# Patient Record
Sex: Male | Born: 1935
Health system: Southern US, Community
[De-identification: ages and names within clinical notes are randomized; demographics above are authoritative.]

## PROBLEM LIST (undated history)

## (undated) DIAGNOSIS — A419 Sepsis, unspecified organism: Secondary | ICD-10-CM

## (undated) DIAGNOSIS — M25559 Pain in unspecified hip: Secondary | ICD-10-CM

## (undated) DIAGNOSIS — K279 Peptic ulcer, site unspecified, unspecified as acute or chronic, without hemorrhage or perforation: Secondary | ICD-10-CM

## (undated) DIAGNOSIS — F329 Major depressive disorder, single episode, unspecified: Secondary | ICD-10-CM

## (undated) DIAGNOSIS — Z9289 Personal history of other medical treatment: Secondary | ICD-10-CM

## (undated) DIAGNOSIS — N400 Enlarged prostate without lower urinary tract symptoms: Secondary | ICD-10-CM

## (undated) DIAGNOSIS — R972 Elevated prostate specific antigen [PSA]: Secondary | ICD-10-CM

## (undated) DIAGNOSIS — I4891 Unspecified atrial fibrillation: Secondary | ICD-10-CM

## (undated) DIAGNOSIS — G8929 Other chronic pain: Secondary | ICD-10-CM

## (undated) DIAGNOSIS — B351 Tinea unguium: Secondary | ICD-10-CM

## (undated) DIAGNOSIS — R269 Unspecified abnormalities of gait and mobility: Secondary | ICD-10-CM

## (undated) DIAGNOSIS — M1990 Unspecified osteoarthritis, unspecified site: Secondary | ICD-10-CM

## (undated) DIAGNOSIS — I10 Essential (primary) hypertension: Secondary | ICD-10-CM

## (undated) DIAGNOSIS — Z9889 Other specified postprocedural states: Secondary | ICD-10-CM

## (undated) DIAGNOSIS — M5416 Radiculopathy, lumbar region: Secondary | ICD-10-CM

## (undated) DIAGNOSIS — S83519A Sprain of anterior cruciate ligament of unspecified knee, initial encounter: Secondary | ICD-10-CM

## (undated) DIAGNOSIS — M545 Low back pain, unspecified: Secondary | ICD-10-CM

## (undated) DIAGNOSIS — F32A Depression, unspecified: Secondary | ICD-10-CM

## (undated) DIAGNOSIS — M21371 Foot drop, right foot: Secondary | ICD-10-CM

## (undated) DIAGNOSIS — I341 Nonrheumatic mitral (valve) prolapse: Secondary | ICD-10-CM

## (undated) DIAGNOSIS — J398 Other specified diseases of upper respiratory tract: Secondary | ICD-10-CM

## (undated) DIAGNOSIS — K5792 Diverticulitis of intestine, part unspecified, without perforation or abscess without bleeding: Secondary | ICD-10-CM

## (undated) DIAGNOSIS — R0600 Dyspnea, unspecified: Secondary | ICD-10-CM

## (undated) DIAGNOSIS — H9190 Unspecified hearing loss, unspecified ear: Secondary | ICD-10-CM

## (undated) DIAGNOSIS — I350 Nonrheumatic aortic (valve) stenosis: Secondary | ICD-10-CM

## (undated) DIAGNOSIS — G4730 Sleep apnea, unspecified: Secondary | ICD-10-CM

## (undated) HISTORY — DX: Nonrheumatic aortic (valve) stenosis: I35.0

## (undated) HISTORY — DX: Sprain of anterior cruciate ligament of unspecified knee, initial encounter: S83.519A

## (undated) HISTORY — DX: Peptic ulcer, site unspecified, unspecified as acute or chronic, without hemorrhage or perforation: K27.9

## (undated) HISTORY — DX: Sleep apnea, unspecified: G47.30

## (undated) HISTORY — DX: Pain in unspecified hip: M25.559

## (undated) HISTORY — PX: CARDIAC CATHETERIZATION: SHX172

## (undated) HISTORY — PX: SHOULDER SURGERY: SHX246

## (undated) HISTORY — DX: Major depressive disorder, single episode, unspecified: F32.9

## (undated) HISTORY — PX: CATARACT EXTRACTION: SUR2

## (undated) HISTORY — DX: Personal history of other medical treatment: Z92.89

## (undated) HISTORY — PX: TONSILLECTOMY: SUR1361

## (undated) HISTORY — DX: Unspecified hearing loss, unspecified ear: H91.90

## (undated) HISTORY — DX: Radiculopathy, lumbar region: M54.16

## (undated) HISTORY — DX: Elevated prostate specific antigen (PSA): R97.20

## (undated) HISTORY — DX: Other chronic pain: G89.29

## (undated) HISTORY — DX: Tinea unguium: B35.1

## (undated) HISTORY — DX: Essential (primary) hypertension: I10

## (undated) HISTORY — DX: Other specified postprocedural states: Z98.890

## (undated) HISTORY — DX: Sepsis, unspecified organism: A41.9

## (undated) HISTORY — DX: Diverticulitis of intestine, part unspecified, without perforation or abscess without bleeding: K57.92

## (undated) HISTORY — DX: Foot drop, right foot: M21.371

## (undated) HISTORY — DX: Benign prostatic hyperplasia without lower urinary tract symptoms: N40.0

## (undated) HISTORY — DX: Low back pain: M54.5

## (undated) HISTORY — DX: Unspecified abnormalities of gait and mobility: R26.9

## (undated) HISTORY — DX: Nonrheumatic mitral (valve) prolapse: I34.1

## (undated) HISTORY — DX: Low back pain, unspecified: M54.50

## (undated) HISTORY — DX: Depression, unspecified: F32.A

---

## 1994-11-14 HISTORY — PX: OTHER SURGICAL HISTORY: SHX169

## 1999-01-15 ENCOUNTER — Ambulatory Visit (HOSPITAL_COMMUNITY): Admission: RE | Admit: 1999-01-15 | Discharge: 1999-01-15 | Payer: Self-pay | Admitting: Gastroenterology

## 2000-06-28 ENCOUNTER — Inpatient Hospital Stay (HOSPITAL_COMMUNITY): Admission: RE | Admit: 2000-06-28 | Discharge: 2000-06-30 | Payer: Self-pay | Admitting: *Deleted

## 2000-07-19 ENCOUNTER — Encounter: Admission: RE | Admit: 2000-07-19 | Discharge: 2000-08-03 | Payer: Self-pay | Admitting: *Deleted

## 2003-11-15 DIAGNOSIS — A419 Sepsis, unspecified organism: Secondary | ICD-10-CM

## 2003-11-15 HISTORY — DX: Sepsis, unspecified organism: A41.9

## 2004-01-02 ENCOUNTER — Ambulatory Visit (HOSPITAL_COMMUNITY): Admission: RE | Admit: 2004-01-02 | Discharge: 2004-01-02 | Payer: Self-pay | Admitting: Gastroenterology

## 2004-12-29 ENCOUNTER — Emergency Department (HOSPITAL_COMMUNITY): Admission: EM | Admit: 2004-12-29 | Discharge: 2004-12-30 | Payer: Self-pay | Admitting: Emergency Medicine

## 2004-12-31 ENCOUNTER — Inpatient Hospital Stay (HOSPITAL_COMMUNITY): Admission: EM | Admit: 2004-12-31 | Discharge: 2005-01-04 | Payer: Self-pay | Admitting: Emergency Medicine

## 2004-12-31 ENCOUNTER — Ambulatory Visit: Payer: Self-pay | Admitting: Infectious Diseases

## 2005-05-26 ENCOUNTER — Encounter: Admission: RE | Admit: 2005-05-26 | Discharge: 2005-05-26 | Payer: Self-pay | Admitting: Orthopedic Surgery

## 2011-12-13 DIAGNOSIS — I10 Essential (primary) hypertension: Secondary | ICD-10-CM | POA: Diagnosis not present

## 2011-12-13 DIAGNOSIS — Z0000 Encounter for general adult medical examination without abnormal findings: Secondary | ICD-10-CM | POA: Diagnosis not present

## 2012-01-12 DIAGNOSIS — L821 Other seborrheic keratosis: Secondary | ICD-10-CM | POA: Diagnosis not present

## 2012-01-12 DIAGNOSIS — L820 Inflamed seborrheic keratosis: Secondary | ICD-10-CM | POA: Diagnosis not present

## 2012-01-12 DIAGNOSIS — L570 Actinic keratosis: Secondary | ICD-10-CM | POA: Diagnosis not present

## 2012-01-12 DIAGNOSIS — C44319 Basal cell carcinoma of skin of other parts of face: Secondary | ICD-10-CM | POA: Diagnosis not present

## 2012-02-09 DIAGNOSIS — C44319 Basal cell carcinoma of skin of other parts of face: Secondary | ICD-10-CM | POA: Diagnosis not present

## 2012-03-22 DIAGNOSIS — C4442 Squamous cell carcinoma of skin of scalp and neck: Secondary | ICD-10-CM | POA: Diagnosis not present

## 2012-03-22 DIAGNOSIS — L570 Actinic keratosis: Secondary | ICD-10-CM | POA: Diagnosis not present

## 2012-03-22 DIAGNOSIS — D044 Carcinoma in situ of skin of scalp and neck: Secondary | ICD-10-CM | POA: Diagnosis not present

## 2012-03-22 DIAGNOSIS — Z85828 Personal history of other malignant neoplasm of skin: Secondary | ICD-10-CM | POA: Diagnosis not present

## 2012-03-22 DIAGNOSIS — L821 Other seborrheic keratosis: Secondary | ICD-10-CM | POA: Diagnosis not present

## 2012-05-09 DIAGNOSIS — R972 Elevated prostate specific antigen [PSA]: Secondary | ICD-10-CM | POA: Diagnosis not present

## 2012-05-09 DIAGNOSIS — N400 Enlarged prostate without lower urinary tract symptoms: Secondary | ICD-10-CM | POA: Diagnosis not present

## 2012-05-09 DIAGNOSIS — N401 Enlarged prostate with lower urinary tract symptoms: Secondary | ICD-10-CM | POA: Diagnosis not present

## 2012-05-31 DIAGNOSIS — I10 Essential (primary) hypertension: Secondary | ICD-10-CM | POA: Diagnosis not present

## 2012-08-29 ENCOUNTER — Ambulatory Visit (INDEPENDENT_AMBULATORY_CARE_PROVIDER_SITE_OTHER): Payer: Medicare Other | Admitting: Family Medicine

## 2012-08-29 DIAGNOSIS — Z23 Encounter for immunization: Secondary | ICD-10-CM | POA: Diagnosis not present

## 2012-09-20 DIAGNOSIS — H04129 Dry eye syndrome of unspecified lacrimal gland: Secondary | ICD-10-CM | POA: Diagnosis not present

## 2012-09-20 DIAGNOSIS — H5200 Hypermetropia, unspecified eye: Secondary | ICD-10-CM | POA: Diagnosis not present

## 2012-09-20 DIAGNOSIS — H01009 Unspecified blepharitis unspecified eye, unspecified eyelid: Secondary | ICD-10-CM | POA: Diagnosis not present

## 2012-09-20 DIAGNOSIS — H2589 Other age-related cataract: Secondary | ICD-10-CM | POA: Diagnosis not present

## 2012-09-26 DIAGNOSIS — D1801 Hemangioma of skin and subcutaneous tissue: Secondary | ICD-10-CM | POA: Diagnosis not present

## 2012-09-26 DIAGNOSIS — L738 Other specified follicular disorders: Secondary | ICD-10-CM | POA: Diagnosis not present

## 2012-09-26 DIAGNOSIS — L820 Inflamed seborrheic keratosis: Secondary | ICD-10-CM | POA: Diagnosis not present

## 2012-09-26 DIAGNOSIS — L570 Actinic keratosis: Secondary | ICD-10-CM | POA: Diagnosis not present

## 2012-09-26 DIAGNOSIS — D239 Other benign neoplasm of skin, unspecified: Secondary | ICD-10-CM | POA: Diagnosis not present

## 2013-01-03 DIAGNOSIS — I10 Essential (primary) hypertension: Secondary | ICD-10-CM | POA: Diagnosis not present

## 2013-01-03 DIAGNOSIS — Z1331 Encounter for screening for depression: Secondary | ICD-10-CM | POA: Diagnosis not present

## 2013-01-03 DIAGNOSIS — Z0000 Encounter for general adult medical examination without abnormal findings: Secondary | ICD-10-CM | POA: Diagnosis not present

## 2013-01-23 DIAGNOSIS — M1710 Unilateral primary osteoarthritis, unspecified knee: Secondary | ICD-10-CM | POA: Diagnosis not present

## 2013-02-05 DIAGNOSIS — R269 Unspecified abnormalities of gait and mobility: Secondary | ICD-10-CM | POA: Diagnosis not present

## 2013-02-05 DIAGNOSIS — M6281 Muscle weakness (generalized): Secondary | ICD-10-CM | POA: Diagnosis not present

## 2013-02-08 DIAGNOSIS — M6281 Muscle weakness (generalized): Secondary | ICD-10-CM | POA: Diagnosis not present

## 2013-02-08 DIAGNOSIS — R269 Unspecified abnormalities of gait and mobility: Secondary | ICD-10-CM | POA: Diagnosis not present

## 2013-02-12 DIAGNOSIS — M6281 Muscle weakness (generalized): Secondary | ICD-10-CM | POA: Diagnosis not present

## 2013-02-12 DIAGNOSIS — R269 Unspecified abnormalities of gait and mobility: Secondary | ICD-10-CM | POA: Diagnosis not present

## 2013-02-14 DIAGNOSIS — M6281 Muscle weakness (generalized): Secondary | ICD-10-CM | POA: Diagnosis not present

## 2013-02-14 DIAGNOSIS — R269 Unspecified abnormalities of gait and mobility: Secondary | ICD-10-CM | POA: Diagnosis not present

## 2013-02-19 DIAGNOSIS — M6281 Muscle weakness (generalized): Secondary | ICD-10-CM | POA: Diagnosis not present

## 2013-02-19 DIAGNOSIS — R269 Unspecified abnormalities of gait and mobility: Secondary | ICD-10-CM | POA: Diagnosis not present

## 2013-02-26 DIAGNOSIS — M1710 Unilateral primary osteoarthritis, unspecified knee: Secondary | ICD-10-CM | POA: Diagnosis not present

## 2013-02-26 DIAGNOSIS — R269 Unspecified abnormalities of gait and mobility: Secondary | ICD-10-CM | POA: Diagnosis not present

## 2013-02-26 DIAGNOSIS — M6281 Muscle weakness (generalized): Secondary | ICD-10-CM | POA: Diagnosis not present

## 2013-03-05 DIAGNOSIS — R269 Unspecified abnormalities of gait and mobility: Secondary | ICD-10-CM | POA: Diagnosis not present

## 2013-03-05 DIAGNOSIS — M6281 Muscle weakness (generalized): Secondary | ICD-10-CM | POA: Diagnosis not present

## 2013-03-08 DIAGNOSIS — M6281 Muscle weakness (generalized): Secondary | ICD-10-CM | POA: Diagnosis not present

## 2013-03-08 DIAGNOSIS — R269 Unspecified abnormalities of gait and mobility: Secondary | ICD-10-CM | POA: Diagnosis not present

## 2013-03-21 DIAGNOSIS — D1801 Hemangioma of skin and subcutaneous tissue: Secondary | ICD-10-CM | POA: Diagnosis not present

## 2013-03-21 DIAGNOSIS — L570 Actinic keratosis: Secondary | ICD-10-CM | POA: Diagnosis not present

## 2013-03-21 DIAGNOSIS — L821 Other seborrheic keratosis: Secondary | ICD-10-CM | POA: Diagnosis not present

## 2013-03-21 DIAGNOSIS — Z85828 Personal history of other malignant neoplasm of skin: Secondary | ICD-10-CM | POA: Diagnosis not present

## 2013-03-21 DIAGNOSIS — L819 Disorder of pigmentation, unspecified: Secondary | ICD-10-CM | POA: Diagnosis not present

## 2013-03-21 DIAGNOSIS — D235 Other benign neoplasm of skin of trunk: Secondary | ICD-10-CM | POA: Diagnosis not present

## 2013-05-13 DIAGNOSIS — S99919A Unspecified injury of unspecified ankle, initial encounter: Secondary | ICD-10-CM | POA: Diagnosis not present

## 2013-05-13 DIAGNOSIS — M25579 Pain in unspecified ankle and joints of unspecified foot: Secondary | ICD-10-CM | POA: Diagnosis not present

## 2013-05-13 DIAGNOSIS — T148XXA Other injury of unspecified body region, initial encounter: Secondary | ICD-10-CM | POA: Diagnosis not present

## 2013-05-13 DIAGNOSIS — S99929A Unspecified injury of unspecified foot, initial encounter: Secondary | ICD-10-CM | POA: Diagnosis not present

## 2013-06-20 DIAGNOSIS — I10 Essential (primary) hypertension: Secondary | ICD-10-CM | POA: Diagnosis not present

## 2013-06-20 DIAGNOSIS — IMO0002 Reserved for concepts with insufficient information to code with codable children: Secondary | ICD-10-CM | POA: Diagnosis not present

## 2013-06-20 DIAGNOSIS — R972 Elevated prostate specific antigen [PSA]: Secondary | ICD-10-CM | POA: Diagnosis not present

## 2013-06-20 DIAGNOSIS — F329 Major depressive disorder, single episode, unspecified: Secondary | ICD-10-CM | POA: Diagnosis not present

## 2013-06-20 DIAGNOSIS — N400 Enlarged prostate without lower urinary tract symptoms: Secondary | ICD-10-CM | POA: Diagnosis not present

## 2013-07-01 DIAGNOSIS — K648 Other hemorrhoids: Secondary | ICD-10-CM | POA: Diagnosis not present

## 2013-07-01 DIAGNOSIS — N400 Enlarged prostate without lower urinary tract symptoms: Secondary | ICD-10-CM | POA: Diagnosis not present

## 2013-07-01 DIAGNOSIS — K922 Gastrointestinal hemorrhage, unspecified: Secondary | ICD-10-CM | POA: Diagnosis not present

## 2013-07-01 DIAGNOSIS — I10 Essential (primary) hypertension: Secondary | ICD-10-CM | POA: Diagnosis not present

## 2013-07-08 DIAGNOSIS — K5731 Diverticulosis of large intestine without perforation or abscess with bleeding: Secondary | ICD-10-CM | POA: Diagnosis not present

## 2013-07-08 DIAGNOSIS — D62 Acute posthemorrhagic anemia: Secondary | ICD-10-CM | POA: Diagnosis not present

## 2013-07-16 DIAGNOSIS — K5731 Diverticulosis of large intestine without perforation or abscess with bleeding: Secondary | ICD-10-CM | POA: Diagnosis not present

## 2013-07-24 DIAGNOSIS — K5731 Diverticulosis of large intestine without perforation or abscess with bleeding: Secondary | ICD-10-CM | POA: Diagnosis not present

## 2013-09-12 ENCOUNTER — Ambulatory Visit (INDEPENDENT_AMBULATORY_CARE_PROVIDER_SITE_OTHER): Payer: Medicare Other | Admitting: Family Medicine

## 2013-09-12 DIAGNOSIS — Z23 Encounter for immunization: Secondary | ICD-10-CM

## 2013-09-24 DIAGNOSIS — H2510 Age-related nuclear cataract, unspecified eye: Secondary | ICD-10-CM | POA: Diagnosis not present

## 2013-09-24 DIAGNOSIS — H5200 Hypermetropia, unspecified eye: Secondary | ICD-10-CM | POA: Diagnosis not present

## 2013-09-24 DIAGNOSIS — H52229 Regular astigmatism, unspecified eye: Secondary | ICD-10-CM | POA: Diagnosis not present

## 2013-10-02 DIAGNOSIS — M1710 Unilateral primary osteoarthritis, unspecified knee: Secondary | ICD-10-CM | POA: Diagnosis not present

## 2013-10-02 DIAGNOSIS — M25469 Effusion, unspecified knee: Secondary | ICD-10-CM | POA: Diagnosis not present

## 2013-10-02 DIAGNOSIS — M25579 Pain in unspecified ankle and joints of unspecified foot: Secondary | ICD-10-CM | POA: Diagnosis not present

## 2013-10-30 DIAGNOSIS — M25579 Pain in unspecified ankle and joints of unspecified foot: Secondary | ICD-10-CM | POA: Diagnosis not present

## 2013-11-18 DIAGNOSIS — M19079 Primary osteoarthritis, unspecified ankle and foot: Secondary | ICD-10-CM | POA: Diagnosis not present

## 2013-11-20 DIAGNOSIS — M1710 Unilateral primary osteoarthritis, unspecified knee: Secondary | ICD-10-CM | POA: Diagnosis not present

## 2013-11-20 DIAGNOSIS — M25579 Pain in unspecified ankle and joints of unspecified foot: Secondary | ICD-10-CM | POA: Diagnosis not present

## 2013-11-26 DIAGNOSIS — M25579 Pain in unspecified ankle and joints of unspecified foot: Secondary | ICD-10-CM | POA: Diagnosis not present

## 2013-12-12 DIAGNOSIS — M19079 Primary osteoarthritis, unspecified ankle and foot: Secondary | ICD-10-CM | POA: Diagnosis not present

## 2013-12-17 DIAGNOSIS — M25579 Pain in unspecified ankle and joints of unspecified foot: Secondary | ICD-10-CM | POA: Diagnosis not present

## 2013-12-23 DIAGNOSIS — M25579 Pain in unspecified ankle and joints of unspecified foot: Secondary | ICD-10-CM | POA: Diagnosis not present

## 2014-01-13 DIAGNOSIS — Z23 Encounter for immunization: Secondary | ICD-10-CM | POA: Diagnosis not present

## 2014-01-13 DIAGNOSIS — F3289 Other specified depressive episodes: Secondary | ICD-10-CM | POA: Diagnosis not present

## 2014-01-13 DIAGNOSIS — Z0000 Encounter for general adult medical examination without abnormal findings: Secondary | ICD-10-CM | POA: Diagnosis not present

## 2014-01-13 DIAGNOSIS — F329 Major depressive disorder, single episode, unspecified: Secondary | ICD-10-CM | POA: Diagnosis not present

## 2014-01-13 DIAGNOSIS — K5730 Diverticulosis of large intestine without perforation or abscess without bleeding: Secondary | ICD-10-CM | POA: Diagnosis not present

## 2014-01-13 DIAGNOSIS — I059 Rheumatic mitral valve disease, unspecified: Secondary | ICD-10-CM | POA: Diagnosis not present

## 2014-01-13 DIAGNOSIS — I10 Essential (primary) hypertension: Secondary | ICD-10-CM | POA: Diagnosis not present

## 2014-04-01 DIAGNOSIS — D485 Neoplasm of uncertain behavior of skin: Secondary | ICD-10-CM | POA: Diagnosis not present

## 2014-04-01 DIAGNOSIS — C44621 Squamous cell carcinoma of skin of unspecified upper limb, including shoulder: Secondary | ICD-10-CM | POA: Diagnosis not present

## 2014-04-01 DIAGNOSIS — Z85828 Personal history of other malignant neoplasm of skin: Secondary | ICD-10-CM | POA: Diagnosis not present

## 2014-04-01 DIAGNOSIS — D1801 Hemangioma of skin and subcutaneous tissue: Secondary | ICD-10-CM | POA: Diagnosis not present

## 2014-04-01 DIAGNOSIS — L821 Other seborrheic keratosis: Secondary | ICD-10-CM | POA: Diagnosis not present

## 2014-04-01 DIAGNOSIS — D239 Other benign neoplasm of skin, unspecified: Secondary | ICD-10-CM | POA: Diagnosis not present

## 2014-04-01 DIAGNOSIS — L570 Actinic keratosis: Secondary | ICD-10-CM | POA: Diagnosis not present

## 2014-04-22 DIAGNOSIS — M2010 Hallux valgus (acquired), unspecified foot: Secondary | ICD-10-CM | POA: Diagnosis not present

## 2014-04-22 DIAGNOSIS — M2040 Other hammer toe(s) (acquired), unspecified foot: Secondary | ICD-10-CM | POA: Diagnosis not present

## 2014-04-22 DIAGNOSIS — B351 Tinea unguium: Secondary | ICD-10-CM | POA: Diagnosis not present

## 2014-05-23 DIAGNOSIS — M76829 Posterior tibial tendinitis, unspecified leg: Secondary | ICD-10-CM | POA: Diagnosis not present

## 2014-05-29 DIAGNOSIS — K5730 Diverticulosis of large intestine without perforation or abscess without bleeding: Secondary | ICD-10-CM | POA: Diagnosis not present

## 2014-05-29 DIAGNOSIS — Z1211 Encounter for screening for malignant neoplasm of colon: Secondary | ICD-10-CM | POA: Diagnosis not present

## 2014-05-29 DIAGNOSIS — D126 Benign neoplasm of colon, unspecified: Secondary | ICD-10-CM | POA: Diagnosis not present

## 2014-06-23 DIAGNOSIS — I10 Essential (primary) hypertension: Secondary | ICD-10-CM | POA: Diagnosis not present

## 2014-06-23 DIAGNOSIS — K113 Abscess of salivary gland: Secondary | ICD-10-CM | POA: Diagnosis not present

## 2014-06-26 DIAGNOSIS — R0609 Other forms of dyspnea: Secondary | ICD-10-CM | POA: Diagnosis not present

## 2014-06-26 DIAGNOSIS — R0989 Other specified symptoms and signs involving the circulatory and respiratory systems: Secondary | ICD-10-CM | POA: Diagnosis not present

## 2014-06-26 DIAGNOSIS — T7020XA Unspecified effects of high altitude, initial encounter: Secondary | ICD-10-CM | POA: Diagnosis not present

## 2014-06-26 DIAGNOSIS — R0602 Shortness of breath: Secondary | ICD-10-CM | POA: Diagnosis not present

## 2014-07-07 DIAGNOSIS — K113 Abscess of salivary gland: Secondary | ICD-10-CM | POA: Diagnosis not present

## 2014-07-07 DIAGNOSIS — I10 Essential (primary) hypertension: Secondary | ICD-10-CM | POA: Diagnosis not present

## 2014-07-07 DIAGNOSIS — T7020XA Unspecified effects of high altitude, initial encounter: Secondary | ICD-10-CM | POA: Diagnosis not present

## 2014-08-25 DIAGNOSIS — N402 Nodular prostate without lower urinary tract symptoms: Secondary | ICD-10-CM | POA: Diagnosis not present

## 2014-08-26 ENCOUNTER — Other Ambulatory Visit (HOSPITAL_COMMUNITY): Payer: Self-pay | Admitting: Urology

## 2014-08-26 DIAGNOSIS — N402 Nodular prostate without lower urinary tract symptoms: Secondary | ICD-10-CM

## 2014-08-29 DIAGNOSIS — Z23 Encounter for immunization: Secondary | ICD-10-CM | POA: Diagnosis not present

## 2014-09-09 ENCOUNTER — Ambulatory Visit (HOSPITAL_COMMUNITY)
Admission: RE | Admit: 2014-09-09 | Discharge: 2014-09-09 | Disposition: A | Discharge status: Home / Self Care | Payer: Medicare Other | Source: Ambulatory Visit | Attending: Urology | Admitting: Urology

## 2014-09-09 DIAGNOSIS — R972 Elevated prostate specific antigen [PSA]: Secondary | ICD-10-CM | POA: Diagnosis not present

## 2014-09-09 DIAGNOSIS — M6289 Other specified disorders of muscle: Secondary | ICD-10-CM | POA: Insufficient documentation

## 2014-09-09 DIAGNOSIS — N400 Enlarged prostate without lower urinary tract symptoms: Secondary | ICD-10-CM | POA: Insufficient documentation

## 2014-09-09 DIAGNOSIS — N402 Nodular prostate without lower urinary tract symptoms: Secondary | ICD-10-CM

## 2014-09-09 DIAGNOSIS — N323 Diverticulum of bladder: Secondary | ICD-10-CM | POA: Insufficient documentation

## 2014-09-09 DIAGNOSIS — K5730 Diverticulosis of large intestine without perforation or abscess without bleeding: Secondary | ICD-10-CM | POA: Diagnosis not present

## 2014-09-09 LAB — POCT I-STAT CREATININE: Creatinine, Ser: 0.9 mg/dL (ref 0.50–1.35)

## 2014-09-09 MED ORDER — GADOBENATE DIMEGLUMINE 529 MG/ML IV SOLN
20.0000 mL | Freq: Once | INTRAVENOUS | Status: AC | PRN
  Administered 2014-09-09: 20 mL via INTRAVENOUS

## 2014-09-22 DIAGNOSIS — R0683 Snoring: Secondary | ICD-10-CM | POA: Diagnosis not present

## 2014-09-25 DIAGNOSIS — H25813 Combined forms of age-related cataract, bilateral: Secondary | ICD-10-CM | POA: Diagnosis not present

## 2014-09-30 DIAGNOSIS — L821 Other seborrheic keratosis: Secondary | ICD-10-CM | POA: Diagnosis not present

## 2014-09-30 DIAGNOSIS — L720 Epidermal cyst: Secondary | ICD-10-CM | POA: Diagnosis not present

## 2014-09-30 DIAGNOSIS — Z85828 Personal history of other malignant neoplasm of skin: Secondary | ICD-10-CM | POA: Diagnosis not present

## 2014-09-30 DIAGNOSIS — L570 Actinic keratosis: Secondary | ICD-10-CM | POA: Diagnosis not present

## 2014-09-30 DIAGNOSIS — D1801 Hemangioma of skin and subcutaneous tissue: Secondary | ICD-10-CM | POA: Diagnosis not present

## 2014-10-23 DIAGNOSIS — G4733 Obstructive sleep apnea (adult) (pediatric): Secondary | ICD-10-CM | POA: Diagnosis not present

## 2014-11-04 DIAGNOSIS — H903 Sensorineural hearing loss, bilateral: Secondary | ICD-10-CM | POA: Diagnosis not present

## 2014-12-01 DIAGNOSIS — H25041 Posterior subcapsular polar age-related cataract, right eye: Secondary | ICD-10-CM | POA: Diagnosis not present

## 2014-12-01 DIAGNOSIS — H2512 Age-related nuclear cataract, left eye: Secondary | ICD-10-CM | POA: Diagnosis not present

## 2014-12-01 DIAGNOSIS — H25011 Cortical age-related cataract, right eye: Secondary | ICD-10-CM | POA: Diagnosis not present

## 2014-12-01 DIAGNOSIS — H2511 Age-related nuclear cataract, right eye: Secondary | ICD-10-CM | POA: Diagnosis not present

## 2014-12-25 DIAGNOSIS — H25812 Combined forms of age-related cataract, left eye: Secondary | ICD-10-CM | POA: Diagnosis not present

## 2014-12-25 DIAGNOSIS — H25012 Cortical age-related cataract, left eye: Secondary | ICD-10-CM | POA: Diagnosis not present

## 2014-12-25 DIAGNOSIS — H2512 Age-related nuclear cataract, left eye: Secondary | ICD-10-CM | POA: Diagnosis not present

## 2014-12-26 DIAGNOSIS — H2511 Age-related nuclear cataract, right eye: Secondary | ICD-10-CM | POA: Diagnosis not present

## 2015-01-01 DIAGNOSIS — C4442 Squamous cell carcinoma of skin of scalp and neck: Secondary | ICD-10-CM | POA: Diagnosis not present

## 2015-01-01 DIAGNOSIS — C44529 Squamous cell carcinoma of skin of other part of trunk: Secondary | ICD-10-CM | POA: Diagnosis not present

## 2015-01-01 DIAGNOSIS — Z85828 Personal history of other malignant neoplasm of skin: Secondary | ICD-10-CM | POA: Diagnosis not present

## 2015-01-01 DIAGNOSIS — D485 Neoplasm of uncertain behavior of skin: Secondary | ICD-10-CM | POA: Diagnosis not present

## 2015-01-13 DIAGNOSIS — G4733 Obstructive sleep apnea (adult) (pediatric): Secondary | ICD-10-CM | POA: Diagnosis not present

## 2015-01-15 DIAGNOSIS — R972 Elevated prostate specific antigen [PSA]: Secondary | ICD-10-CM | POA: Diagnosis not present

## 2015-01-15 DIAGNOSIS — I10 Essential (primary) hypertension: Secondary | ICD-10-CM | POA: Diagnosis not present

## 2015-01-15 DIAGNOSIS — R296 Repeated falls: Secondary | ICD-10-CM | POA: Diagnosis not present

## 2015-01-15 DIAGNOSIS — Z1389 Encounter for screening for other disorder: Secondary | ICD-10-CM | POA: Diagnosis not present

## 2015-01-15 DIAGNOSIS — H9193 Unspecified hearing loss, bilateral: Secondary | ICD-10-CM | POA: Diagnosis not present

## 2015-01-15 DIAGNOSIS — H269 Unspecified cataract: Secondary | ICD-10-CM | POA: Diagnosis not present

## 2015-01-15 DIAGNOSIS — G4733 Obstructive sleep apnea (adult) (pediatric): Secondary | ICD-10-CM | POA: Diagnosis not present

## 2015-01-15 DIAGNOSIS — I341 Nonrheumatic mitral (valve) prolapse: Secondary | ICD-10-CM | POA: Diagnosis not present

## 2015-01-15 DIAGNOSIS — Z0000 Encounter for general adult medical examination without abnormal findings: Secondary | ICD-10-CM | POA: Diagnosis not present

## 2015-01-20 DIAGNOSIS — L720 Epidermal cyst: Secondary | ICD-10-CM | POA: Diagnosis not present

## 2015-01-20 DIAGNOSIS — Z85828 Personal history of other malignant neoplasm of skin: Secondary | ICD-10-CM | POA: Diagnosis not present

## 2015-01-22 DIAGNOSIS — H2511 Age-related nuclear cataract, right eye: Secondary | ICD-10-CM | POA: Diagnosis not present

## 2015-01-22 DIAGNOSIS — H25811 Combined forms of age-related cataract, right eye: Secondary | ICD-10-CM | POA: Diagnosis not present

## 2015-02-03 ENCOUNTER — Encounter: Payer: Self-pay | Admitting: Neurology

## 2015-02-03 ENCOUNTER — Ambulatory Visit (INDEPENDENT_AMBULATORY_CARE_PROVIDER_SITE_OTHER): Payer: Medicare Other | Admitting: Neurology

## 2015-02-03 VITALS — BP 183/88 | HR 58 | Ht 69.0 in | Wt 106.0 lb

## 2015-02-03 DIAGNOSIS — M21371 Foot drop, right foot: Secondary | ICD-10-CM | POA: Diagnosis not present

## 2015-02-03 DIAGNOSIS — M216X9 Other acquired deformities of unspecified foot: Secondary | ICD-10-CM | POA: Diagnosis not present

## 2015-02-03 DIAGNOSIS — R269 Unspecified abnormalities of gait and mobility: Secondary | ICD-10-CM | POA: Diagnosis not present

## 2015-02-03 DIAGNOSIS — E538 Deficiency of other specified B group vitamins: Secondary | ICD-10-CM | POA: Diagnosis not present

## 2015-02-03 HISTORY — DX: Unspecified abnormalities of gait and mobility: R26.9

## 2015-02-03 NOTE — Patient Instructions (Signed)

## 2015-02-03 NOTE — Progress Notes (Signed)
  Reason for visit: Gait disorder  Referring physician: Dr. Karen Schooler  Patrick Franco is a 81 y.o. male  History of present illness:  [name] is a 81-year-old right-handed white male with a history of a mild gait disorder that he believes has come on over the last 5 years, particularly over the last 2 years. The patient is actively engaged in yoga and Pilates, he believes that this is helpful for him, if he is not engaged in this activity for a week or 2, he believes that his balance is much worse. The patient does have a history of low back pain, and some troubles with sciatica pain at times, he believes that the left leg is slightly weaker than the right, but he has had some tendon injury in the right foot and ankle, and he has a bit of a footdrop on the right. The patient denies that he catches his toe with walking. He does well on level ground, but he has fallen on 3 occasions while fishing in a trout stream, or while quail hunting. The patient denies any numbness in the feet that is persistent, he will have occasional numbness that is temporary in the left foot. The patient has had a right total knee replacement. He has arthritis in the left knee and hip. He plays tennis, but he is careful not to try to run backwards at this time. The patient does not use a cane or other assistive device for ambulation. He does note some very mild changes in memory, but he has not given up any activities of daily living because of memory. He denies neck pain or pain down the arms. He does have benign prostate enlargement, and he is on medication for this. He has frequency of urination. He comes to this office for an evaluation.  Past Medical History  Diagnosis Date  . Peptic ulcer disease   . Hypertension   . Diverticulitis   . History of colonoscopy   . Onychomycosis   . Mitral valve prolapse   . BPH (benign prostatic hyperplasia)   . Hip pain   . Sepsis 2005    e. coli-after prostate bx  . Torn  ACL   . Lumbar radiculopathy   . Sleep apnea   . Foot drop, right   . Elevated PSA   . Depression   . Gait disorder 02/03/2015  . HOH (hard of hearing)     hearing aid  . Chronic low back pain   . Foot drop, right     Past Surgical History  Procedure Laterality Date  . Right knee replacement Right 1996  . Cataract extraction Bilateral   . Tonsillectomy      Family History  Problem Relation Age of Onset  . Pneumonia Mother 82  . Cancer - Lung Father 56  . Multiple myeloma Brother 55    Social history:  reports that he has never smoked. He has never used smokeless tobacco. He reports that he does not use illicit drugs. His alcohol history is not on file.  Medications:  Prior to Admission medications   Medication Sig Start Date End Date Taking? Authorizing Provider  acetaZOLAMIDE (DIAMOX) 125 MG tablet Take 125 mg by mouth 2 (two) times daily.   Yes Historical Provider, MD  aspirin 81 MG tablet Take 81 mg by mouth daily.   Yes Historical Provider, MD  cholecalciferol (VITAMIN D) 1000 UNITS tablet Take 1,000 Units by mouth daily.   Yes Historical Provider,   MD  citalopram (CELEXA) 20 MG tablet Take 20 mg by mouth daily. 12/22/14  Yes Historical Provider, MD  dutasteride (AVODART) 0.5 MG capsule Take 0.5 mg by mouth daily. 01/06/15  Yes Historical Provider, MD  indapamide (LOZOL) 2.5 MG tablet Take 2.5 mg by mouth daily. 01/21/15  Yes Historical Provider, MD  Melatonin 3 MG TABS Take 3 mg by mouth daily as needed (at bedtime).   Yes Historical Provider, MD  Omega-3 Fatty Acids (FISH OIL PO) Take 1,000 mg by mouth daily.   Yes Historical Provider, MD  sildenafil (VIAGRA) 50 MG tablet Take 50 mg by mouth daily as needed for erectile dysfunction.   Yes Historical Provider, MD  tamsulosin (FLOMAX) 0.4 MG CAPS capsule Take 0.4 mg by mouth daily. 01/06/15  Yes Historical Provider, MD  verapamil (CALAN-SR) 240 MG CR tablet Take 240 mg by mouth daily. 01/21/15  Yes Historical Provider, MD      No Known Allergies  ROS:  Out of a complete 14 system review of symptoms, the patient complains only of the following symptoms, and all other reviewed systems are negative.  Hearing loss, hearing aids Shortness of breath, snoring Urination problems Numbness of left foot  Blood pressure 183/88, pulse 58, height 5' 9" (1.753 m), weight 106 lb (48.081 kg).  Physical Exam  General: The patient is alert and cooperative at the time of the examination. The patient is moderately obese.  Eyes: Pupils are equal, round, and reactive to light. Discs are flat bilaterally.  Neck: The neck is supple, no carotid bruits are noted.  Respiratory: The respiratory examination is clear.  Cardiovascular: The cardiovascular examination reveals a regular rate and rhythm, no obvious murmurs or rubs are noted.  Skin: Extremities are without significant edema.  Neurologic Exam  Mental status: The patient is alert and oriented x 3 at the time of the examination. The patient has apparent normal recent and remote memory, with an apparently normal attention span and concentration ability.  Cranial nerves: Facial symmetry is present. There is good sensation of the face to pinprick and soft touch bilaterally. The strength of the facial muscles and the muscles to head turning and shoulder shrug are normal bilaterally. Speech is well enunciated, no aphasia or dysarthria is noted. Extraocular movements are full. Visual fields are full. The tongue is midline, and the patient has symmetric elevation of the soft palate. No obvious hearing deficits are noted.  Motor: The motor testing reveals 5 over 5 strength of all 4 extremities. Good symmetric motor tone is noted throughout.  Sensory: Sensory testing is intact to pinprick, soft touch, vibration sensation, and position sense on all 4 extremities. No evidence of extinction is noted.  Coordination: Cerebellar testing reveals good finger-nose-finger and heel-to-shin  bilaterally.  Gait and station: Gait is normal. Tandem gait is minimally unsteady. Romberg is negative. No drift is seen. The patient is able to walk on the toes bilaterally, unable to walk on heels bilaterally.  Reflexes: Deep tendon reflexes are symmetric, but are slightly depressed bilaterally. Toes are downgoing bilaterally.   Assessment/Plan:  1. Mild gait disorder  2. Mild bilateral foot drops  3. Degenerative arthritis, right total knee replacement  4. Low back pain  5. Mild memory disorder  The patient reports some mild changes in balance over the last several years. The gait is minimally unsteady, may be close to what would be expected for age. The patient has had a total knee replacement on the right which may impair balance, he  also has some history of low back issues. He has mild foot drops, right greater than left. The patient will undergo blood work today, MRI evaluation of the brain. He will follow-up through this office if needed, the patient is engaged in Pilates and yoga which is an excellent exercise for balance.  C. Keith  MD 02/03/2015 7:50 PM  [facility] [address] [place], [place] [address]  Phone [phone] Fax [phone]  

## 2015-02-05 ENCOUNTER — Telehealth: Payer: Self-pay

## 2015-02-05 DIAGNOSIS — M21371 Foot drop, right foot: Secondary | ICD-10-CM | POA: Diagnosis not present

## 2015-02-05 DIAGNOSIS — M25571 Pain in right ankle and joints of right foot: Secondary | ICD-10-CM | POA: Diagnosis not present

## 2015-02-05 DIAGNOSIS — M25562 Pain in left knee: Secondary | ICD-10-CM | POA: Diagnosis not present

## 2015-02-05 LAB — VITAMIN B12: Vitamin B-12: 466 pg/mL (ref 211–946)

## 2015-02-05 LAB — COPPER, SERUM: COPPER: 117 ug/dL (ref 72–166)

## 2015-02-05 LAB — METHYLMALONIC ACID, SERUM: Methylmalonic Acid: 215 nmol/L (ref 0–378)

## 2015-02-05 NOTE — Telephone Encounter (Signed)
Left voicemail relaying that patient's blood work was normal.

## 2015-02-05 NOTE — Telephone Encounter (Signed)
-----   Message from Kathrynn Ducking, MD sent at 02/05/2015  1:02 PM EDT -----  The blood work results are unremarkable. Please call the patient.  ----- Message -----    From: [facility] Lab Results In Interface    Sent: 02/04/2015   7:45 AM      To: Kathrynn Ducking, MD

## 2015-02-24 ENCOUNTER — Ambulatory Visit
Admission: RE | Admit: 2015-02-24 | Discharge: 2015-02-24 | Disposition: A | Discharge status: Home / Self Care | Payer: Medicare Other | Source: Ambulatory Visit | Attending: Neurology | Admitting: Neurology

## 2015-02-24 DIAGNOSIS — E538 Deficiency of other specified B group vitamins: Secondary | ICD-10-CM

## 2015-02-24 DIAGNOSIS — R269 Unspecified abnormalities of gait and mobility: Secondary | ICD-10-CM | POA: Diagnosis not present

## 2015-02-24 DIAGNOSIS — M21371 Foot drop, right foot: Secondary | ICD-10-CM | POA: Diagnosis not present

## 2015-02-25 ENCOUNTER — Telehealth: Payer: Self-pay | Admitting: Neurology

## 2015-02-25 NOTE — Telephone Encounter (Signed)
  I called the patient, talk with the wife. MRI the brain shows very minimal small vessel disease. Likely not the source of the balance issues. The patient has bilateral foot drops, a total knee replacement. This is likely the primary issue. The patient is participating in Pilates, and this should help his balance.  MRI brain 02/25/2015:  IMPRESSION:  Mildly abnormal MRI brain (without) demonstrating: 1. Mild scattered periventricular and subcortical chronic small vessel ischemic disease.  2. Mild perisylvian and moderate mesial temporal atrophy.  3. No acute findings.

## 2015-04-02 DIAGNOSIS — H52222 Regular astigmatism, left eye: Secondary | ICD-10-CM | POA: Diagnosis not present

## 2015-04-02 DIAGNOSIS — H04209 Unspecified epiphora, unspecified lacrimal gland: Secondary | ICD-10-CM | POA: Diagnosis not present

## 2015-04-03 DIAGNOSIS — Z85828 Personal history of other malignant neoplasm of skin: Secondary | ICD-10-CM | POA: Diagnosis not present

## 2015-04-03 DIAGNOSIS — C44529 Squamous cell carcinoma of skin of other part of trunk: Secondary | ICD-10-CM | POA: Diagnosis not present

## 2015-04-03 DIAGNOSIS — L570 Actinic keratosis: Secondary | ICD-10-CM | POA: Diagnosis not present

## 2015-04-03 DIAGNOSIS — L720 Epidermal cyst: Secondary | ICD-10-CM | POA: Diagnosis not present

## 2015-04-03 DIAGNOSIS — D1801 Hemangioma of skin and subcutaneous tissue: Secondary | ICD-10-CM | POA: Diagnosis not present

## 2015-04-03 DIAGNOSIS — L821 Other seborrheic keratosis: Secondary | ICD-10-CM | POA: Diagnosis not present

## 2015-04-07 DIAGNOSIS — Z85828 Personal history of other malignant neoplasm of skin: Secondary | ICD-10-CM | POA: Diagnosis not present

## 2015-04-07 DIAGNOSIS — C44529 Squamous cell carcinoma of skin of other part of trunk: Secondary | ICD-10-CM | POA: Diagnosis not present

## 2015-04-22 DIAGNOSIS — D045 Carcinoma in situ of skin of trunk: Secondary | ICD-10-CM | POA: Diagnosis not present

## 2015-04-22 DIAGNOSIS — G4733 Obstructive sleep apnea (adult) (pediatric): Secondary | ICD-10-CM | POA: Diagnosis not present

## 2015-04-22 DIAGNOSIS — I499 Cardiac arrhythmia, unspecified: Secondary | ICD-10-CM | POA: Diagnosis not present

## 2015-04-22 DIAGNOSIS — R0602 Shortness of breath: Secondary | ICD-10-CM | POA: Diagnosis not present

## 2015-08-18 ENCOUNTER — Observation Stay (HOSPITAL_COMMUNITY)
Admission: EM | Admit: 2015-08-18 | Discharge: 2015-08-19 | Disposition: A | Discharge status: Home / Self Care | Payer: Medicare Other | Attending: Internal Medicine | Admitting: Internal Medicine

## 2015-08-18 ENCOUNTER — Encounter (HOSPITAL_COMMUNITY): Payer: Self-pay | Admitting: *Deleted

## 2015-08-18 DIAGNOSIS — K259 Gastric ulcer, unspecified as acute or chronic, without hemorrhage or perforation: Principal | ICD-10-CM | POA: Insufficient documentation

## 2015-08-18 DIAGNOSIS — G8929 Other chronic pain: Secondary | ICD-10-CM | POA: Diagnosis not present

## 2015-08-18 DIAGNOSIS — G4730 Sleep apnea, unspecified: Secondary | ICD-10-CM | POA: Insufficient documentation

## 2015-08-18 DIAGNOSIS — I10 Essential (primary) hypertension: Secondary | ICD-10-CM | POA: Diagnosis present

## 2015-08-18 DIAGNOSIS — Z7982 Long term (current) use of aspirin: Secondary | ICD-10-CM | POA: Insufficient documentation

## 2015-08-18 DIAGNOSIS — R5383 Other fatigue: Secondary | ICD-10-CM | POA: Diagnosis not present

## 2015-08-18 DIAGNOSIS — K254 Chronic or unspecified gastric ulcer with hemorrhage: Secondary | ICD-10-CM

## 2015-08-18 DIAGNOSIS — D62 Acute posthemorrhagic anemia: Secondary | ICD-10-CM | POA: Diagnosis not present

## 2015-08-18 DIAGNOSIS — R42 Dizziness and giddiness: Secondary | ICD-10-CM | POA: Diagnosis not present

## 2015-08-18 DIAGNOSIS — M545 Low back pain, unspecified: Secondary | ICD-10-CM | POA: Diagnosis present

## 2015-08-18 DIAGNOSIS — K5730 Diverticulosis of large intestine without perforation or abscess without bleeding: Secondary | ICD-10-CM | POA: Diagnosis not present

## 2015-08-18 DIAGNOSIS — K250 Acute gastric ulcer with hemorrhage: Secondary | ICD-10-CM | POA: Diagnosis not present

## 2015-08-18 DIAGNOSIS — K922 Gastrointestinal hemorrhage, unspecified: Secondary | ICD-10-CM | POA: Diagnosis not present

## 2015-08-18 DIAGNOSIS — N400 Enlarged prostate without lower urinary tract symptoms: Secondary | ICD-10-CM | POA: Diagnosis not present

## 2015-08-18 DIAGNOSIS — R0989 Other specified symptoms and signs involving the circulatory and respiratory systems: Secondary | ICD-10-CM | POA: Diagnosis not present

## 2015-08-18 DIAGNOSIS — K279 Peptic ulcer, site unspecified, unspecified as acute or chronic, without hemorrhage or perforation: Secondary | ICD-10-CM | POA: Diagnosis not present

## 2015-08-18 DIAGNOSIS — K921 Melena: Secondary | ICD-10-CM | POA: Diagnosis not present

## 2015-08-18 DIAGNOSIS — R195 Other fecal abnormalities: Secondary | ICD-10-CM | POA: Diagnosis not present

## 2015-08-18 DIAGNOSIS — R0609 Other forms of dyspnea: Secondary | ICD-10-CM | POA: Diagnosis not present

## 2015-08-18 LAB — COMPREHENSIVE METABOLIC PANEL
ALT: 33 U/L (ref 17–63)
AST: 28 U/L (ref 15–41)
Albumin: 3.7 g/dL (ref 3.5–5.0)
Alkaline Phosphatase: 39 U/L (ref 38–126)
Anion gap: 10 (ref 5–15)
BUN: 18 mg/dL (ref 6–20)
CHLORIDE: 98 mmol/L — AB (ref 101–111)
CO2: 30 mmol/L (ref 22–32)
CREATININE: 0.96 mg/dL (ref 0.61–1.24)
Calcium: 9 mg/dL (ref 8.9–10.3)
Glucose, Bld: 102 mg/dL — ABNORMAL HIGH (ref 65–99)
POTASSIUM: 3.6 mmol/L (ref 3.5–5.1)
Sodium: 138 mmol/L (ref 135–145)
Total Bilirubin: 0.9 mg/dL (ref 0.3–1.2)
Total Protein: 5.8 g/dL — ABNORMAL LOW (ref 6.5–8.1)

## 2015-08-18 LAB — CBC
HCT: 30.2 % — ABNORMAL LOW (ref 39.0–52.0)
Hemoglobin: 10.1 g/dL — ABNORMAL LOW (ref 13.0–17.0)
MCH: 33.2 pg (ref 26.0–34.0)
MCHC: 33.4 g/dL (ref 30.0–36.0)
MCV: 99.3 fL (ref 78.0–100.0)
PLATELETS: 229 10*3/uL (ref 150–400)
RBC: 3.04 MIL/uL — AB (ref 4.22–5.81)
RDW: 14.1 % (ref 11.5–15.5)
WBC: 7.9 10*3/uL (ref 4.0–10.5)

## 2015-08-18 LAB — TYPE AND SCREEN
ABO/RH(D): A POS
Antibody Screen: NEGATIVE

## 2015-08-18 LAB — HEMOGLOBIN AND HEMATOCRIT, BLOOD
HCT: 29 % — ABNORMAL LOW (ref 39.0–52.0)
Hemoglobin: 9.9 g/dL — ABNORMAL LOW (ref 13.0–17.0)

## 2015-08-18 LAB — ABO/RH: ABO/RH(D): A POS

## 2015-08-18 LAB — POC OCCULT BLOOD, ED: FECAL OCCULT BLD: POSITIVE — AB

## 2015-08-18 MED ORDER — ALUM & MAG HYDROXIDE-SIMETH 200-200-20 MG/5ML PO SUSP: 30.0000 mL | Freq: Four times a day (QID) | ORAL | Status: DC | PRN

## 2015-08-18 MED ORDER — CITALOPRAM HYDROBROMIDE 20 MG PO TABS
20.0000 mg | ORAL_TABLET | Freq: Every day | ORAL | Status: DC
  Administered 2015-08-19: 20 mg via ORAL
  Filled 2015-08-18: qty 1

## 2015-08-18 MED ORDER — PANTOPRAZOLE SODIUM 40 MG IV SOLR
40.0000 mg | Freq: Two times a day (BID) | INTRAVENOUS | Status: DC
  Administered 2015-08-18 – 2015-08-19 (×2): 40 mg via INTRAVENOUS
  Filled 2015-08-18 (×3): qty 40

## 2015-08-18 MED ORDER — SODIUM CHLORIDE 0.9 % IJ SOLN
3.0000 mL | Freq: Two times a day (BID) | INTRAMUSCULAR | Status: DC
  Administered 2015-08-18 – 2015-08-19 (×2): 3 mL via INTRAVENOUS

## 2015-08-18 MED ORDER — DUTASTERIDE 0.5 MG PO CAPS
0.5000 mg | ORAL_CAPSULE | Freq: Every day | ORAL | Status: DC
  Administered 2015-08-19: 0.5 mg via ORAL
  Filled 2015-08-18 (×2): qty 1

## 2015-08-18 MED ORDER — ONDANSETRON HCL 4 MG/2ML IJ SOLN: 4.0000 mg | Freq: Four times a day (QID) | INTRAMUSCULAR | Status: DC | PRN

## 2015-08-18 MED ORDER — SODIUM CHLORIDE 0.9 % IV SOLN
INTRAVENOUS | Status: DC
  Administered 2015-08-18: 20:00:00 via INTRAVENOUS

## 2015-08-18 MED ORDER — VERAPAMIL HCL ER 240 MG PO TBCR
240.0000 mg | EXTENDED_RELEASE_TABLET | Freq: Every day | ORAL | Status: DC
  Administered 2015-08-19: 240 mg via ORAL
  Filled 2015-08-18 (×2): qty 1

## 2015-08-18 MED ORDER — HYDROMORPHONE HCL 1 MG/ML IJ SOLN: 1.0000 mg | INTRAMUSCULAR | Status: DC | PRN

## 2015-08-18 MED ORDER — TAMSULOSIN HCL 0.4 MG PO CAPS
0.4000 mg | ORAL_CAPSULE | Freq: Every day | ORAL | Status: DC
Start: 2015-08-19 — End: 2015-08-19
  Administered 2015-08-19: 0.4 mg via ORAL
  Filled 2015-08-18: qty 1

## 2015-08-18 MED ORDER — SUCRALFATE 1 G PO TABS
1.0000 g | ORAL_TABLET | Freq: Once | ORAL | Status: AC
  Administered 2015-08-18: 1 g via ORAL
  Filled 2015-08-18: qty 1

## 2015-08-18 MED ORDER — ACETAMINOPHEN 650 MG RE SUPP: 650.0000 mg | Freq: Four times a day (QID) | RECTAL | Status: DC | PRN

## 2015-08-18 MED ORDER — PANTOPRAZOLE SODIUM 40 MG IV SOLR
40.0000 mg | Freq: Once | INTRAVENOUS | Status: AC
  Administered 2015-08-18: 40 mg via INTRAVENOUS
  Filled 2015-08-18: qty 40

## 2015-08-18 MED ORDER — SUCRALFATE 1 GM/10ML PO SUSP
1.0000 g | Freq: Three times a day (TID) | ORAL | Status: DC
  Administered 2015-08-18 – 2015-08-19 (×2): 1 g via ORAL
  Filled 2015-08-18 (×2): qty 10

## 2015-08-18 MED ORDER — ONDANSETRON HCL 4 MG PO TABS: 4.0000 mg | ORAL_TABLET | Freq: Four times a day (QID) | ORAL | Status: DC | PRN

## 2015-08-18 MED ORDER — ACETAMINOPHEN 325 MG PO TABS: 650.0000 mg | ORAL_TABLET | Freq: Four times a day (QID) | ORAL | Status: DC | PRN

## 2015-08-18 NOTE — Progress Notes (Signed)
Report received for ER on patient

## 2015-08-18 NOTE — Consult Note (Signed)
Referring Provider: Dr. Leo Grosser [facility] EDP) Primary Care Physician:  Dr. Lavone Orn Primary Gastroenterologist:  Dr. Earle Gell  Reason for Consultation:  GI bleeding  HPI: Patrick Franco is a 81 y.o. male with no prior history of GI bleeding, who was seen by the nurse practitioner today at his primary 55 office because of dyspnea on modest exertion, and dizziness, and was noted to have dark, heme positive stool on rectal exam. He has not been on iron or Pepto-Bismol.   There is no past history of ulcer disease but the patient is on a daily aspirin without any ongoing PPI prophylaxis. He was sent to the emergency room, where his hemoglobin was noted to be 10.1, as compared to 15.8 in March 2015 at his primary physician's office. His BUN of 18 is essentially baseline.   He has only been having about 1 stool per day but they have been dark for about 5 days and, as noted, he has been symptomatic, compatible with anemia, for several weeks. He was actually up in [place] duck hunting about a week ago but found it difficult to maintain exertion.   He has not been having any upper tract dyspeptic symptomatology.  The patient has had several colonoscopies through the years, but only his most recent one, in July 2015 by Dr. Howell Rucks, showed any polyps (2 diminutive adenomatous polyps were present). He was noted to have pancolonic diverticulosis.   Past Medical History  Diagnosis Date  . Peptic ulcer disease   . Hypertension   . Diverticulitis   . History of colonoscopy   . Onychomycosis   . Mitral valve prolapse   . BPH (benign prostatic hyperplasia)   . Hip pain   . Sepsis ([place]) 2005    e. coli-after prostate bx  . Torn ACL   . Lumbar radiculopathy   . Sleep apnea   . Foot drop, right   . Elevated PSA   . Depression   . Gait disorder 02/03/2015  . HOH (hard of hearing)     hearing aid  . Chronic low back pain   . Foot drop, right     Past Surgical History  Procedure  Laterality Date  . Right knee replacement Right 1996  . Cataract extraction Bilateral   . Tonsillectomy      Prior to Admission medications   Medication Sig Start Date End Date Taking? Authorizing Provider  acetaZOLAMIDE (DIAMOX) 125 MG tablet Take 125 mg by mouth as needed (altitude changes).    Yes Historical Provider, MD  aspirin 81 MG tablet Take 81 mg by mouth daily.   Yes Historical Provider, MD  citalopram (CELEXA) 20 MG tablet Take 20 mg by mouth daily. 12/22/14  Yes Historical Provider, MD  dutasteride (AVODART) 0.5 MG capsule Take 0.5 mg by mouth daily. 01/06/15  Yes Historical Provider, MD  indapamide (LOZOL) 2.5 MG tablet Take 2.5 mg by mouth daily. 01/21/15  Yes Historical Provider, MD  Melatonin 3 MG TABS Take 3 mg by mouth at bedtime.    Yes Historical Provider, MD  Omega-3 Fatty Acids (FISH OIL PO) Take 1,000 mg by mouth daily.   Yes Historical Provider, MD  Probiotic Product (PROBIOTIC DAILY PO) Take 1 tablet by mouth daily.   Yes Historical Provider, MD  tamsulosin (FLOMAX) 0.4 MG CAPS capsule Take 0.4 mg by mouth daily. 01/06/15  Yes Historical Provider, MD  Turmeric 500 MG CAPS Take 500 mg by mouth daily.   Yes Historical Provider, MD  verapamil (CALAN-SR) 240 MG CR tablet Take 240 mg by mouth daily. 01/21/15  Yes Historical Provider, MD    No current facility-administered medications for this encounter.   Current Outpatient Prescriptions  Medication Sig Dispense Refill  . acetaZOLAMIDE (DIAMOX) 125 MG tablet Take 125 mg by mouth as needed (altitude changes).     [name] aspirin 81 MG tablet Take 81 mg by mouth daily.    . citalopram (CELEXA) 20 MG tablet Take 20 mg by mouth daily.  2  . dutasteride (AVODART) 0.5 MG capsule Take 0.5 mg by mouth daily.  2  . indapamide (LOZOL) 2.5 MG tablet Take 2.5 mg by mouth daily.  1  . Melatonin 3 MG TABS Take 3 mg by mouth at bedtime.     . Omega-3 Fatty Acids (FISH OIL PO) Take 1,000 mg by mouth daily.    . Probiotic Product (PROBIOTIC  DAILY PO) Take 1 tablet by mouth daily.    . tamsulosin (FLOMAX) 0.4 MG CAPS capsule Take 0.4 mg by mouth daily.  2  . Turmeric 500 MG CAPS Take 500 mg by mouth daily.    . verapamil (CALAN-SR) 240 MG CR tablet Take 240 mg by mouth daily.  2    Allergies as of 08/18/2015  . (No Known Allergies)    Family History  Problem Relation Age of Onset  . Pneumonia Mother 78  . Cancer - Lung Father 45  . Multiple myeloma Brother 63    Social History   Social History  . Marital Status: Married    Spouse Name: N/A  . Number of Children: N/A  . Years of Education: N/A   Occupational History  . Not on file.   Social History Main Topics  . Smoking status: Never Smoker   . Smokeless tobacco: Never Used  . Alcohol Use: Not on file  . Drug Use: No  . Sexual Activity: Not on file   Other Topics Concern  . Not on file   Social History Narrative   Patient is right handed.   Patient drinks 1 cup of coffee per day.    Review of Systems: Positive for dyspnea on exertion, mild dizziness, numbness in his feet, dark stools. Patient has sleep apnea and uses CPAP.  Physical Exam: Vital signs in last 24 hours: Temp:  [97.7 F (36.5 C)] 97.7 F (36.5 C) (10/04 1517) Pulse Rate:  [73-78] 78 (10/04 1715) Resp:  [13-18] 13 (10/04 1715) BP: (134-150)/(54-80) 144/80 mmHg (10/04 1715) SpO2:  [95 %-97 %] 97 % (10/04 1715)   General:   Alert,  Well-developed, moderately overweight, pleasant and cooperative in absolutely NAD Head:  Normocephalic and atraumatic. Eyes:  Sclera clear, no icterus.   Conjunctiva pink. Neck:   No masses or thyromegaly. Lungs:  Clear throughout to auscultation.   No wheezes, crackles, or rhonchi. No evident respiratory distress. Heart:   Regular rate and rhythm; soft systolic murmur. Abdomen: Nontympanitic, and nondistended. No masses or hepatosplenomegaly . Normal bowel sounds, without bruits, guarding, or rebound.  Small, reducible umbilical hernia present. Rectal:   Per ER staff, showed dark, heme positive stool   Msk:   Symmetrical without gross deformities. Pulses:  Normal radial pulse is noted. Extremities:   Without clubbing, cyanosis, or edema. Neurologic:  Alert and coherent;  grossly normal neurologically. Skin:  Intact without significant lesions or rashes. Warm and dry. Cervical Nodes:  No significant cervical adenopathy. Psych:   Alert and cooperative. Normal mood and affect.  Intake/Output from previous  day:   Intake/Output this shift:    Lab Results:  Recent Labs  08/18/15 1542  WBC 7.9  HGB 10.1*  HCT 30.2*  PLT 229   BMET  Recent Labs  08/18/15 1542  NA 138  K 3.6  CL 98*  CO2 30  GLUCOSE 102*  BUN 18  CREATININE 0.96  CALCIUM 9.0   LFT  Recent Labs  08/18/15 1542  PROT 5.8*  ALBUMIN 3.7  AST 28  ALT 33  ALKPHOS 39  BILITOT 0.9   PT/INR No results for input(s): LABPROT, INR in the last 72 hours.  Studies/Results: No results found.  Impression: 1. Dark, heme positive stool without evidence of active GI bleeding (normal BUN, bowel movements are not frequent or liquid in character) 2. Anemia, presumably subacute (MCV normal), with 5 g drop in hemoglobin over the past year. 3. Aspirin exposure (daily 81 mg) 4. History of pancolonic diverticulosis and 2 diminutive adenomatous polyps on last year's colonoscopy.  Plan: 1. Initiate PPI therapy and give single dose of sucralfate 2. Endoscopy tomorrow by our hospital physician. Petra Kuba, purpose, and risks of the procedure were reviewed with the patient and his wife who is at the bedside. They are somewhat familiar from a prior endoscopy done by Dr. Lyla Son roughly 20 years ago. 3. I explained that overnight observation is appropriate in view of the presence of dark stool, heme positivity, and the significant drop in hemoglobin, even though it appears that this has probably been a gradual blood loss. Assuming that tomorrow's endoscopy does not show an ulcer  that appears to be at high risk for further bleeding, discharge following the endoscopy would probably be appropriate. 4. Ultimately, a decision will have to be made as to whether this patient really needs is chronic low-dose aspirin therapy. He has no history of coronary disease and, especially if aspirin related GI bleeding is confirmed on tomorrow's endoscopy, the risk-benefit ratio may not favor continued aspirin therapy. This decision might be deferred to the patient's primary physician.     Jordan V  08/18/2015, 6:57 PM   Pager [phone] If no answer or after 5 PM call [phone]

## 2015-08-18 NOTE — ED Provider Notes (Signed)
CSN: [id number]     Arrival date & time 08/18/15  1444 History   First MD Initiated Contact with Patient 08/18/15 1640     Chief Complaint  Patient presents with  . GI Bleeding    The history is provided by the patient, the spouse and medical records.    81 year old male with history of peptic ulcer disease/H. pylori, diverticulitis, hypertension, mitral valve prolapse, BPH presenting with generalized weakness and fatigue. Onset a few weeks ago. Worsening over the last 5 days. Worse with exertion. Moderate severity. Associated with dark stools over the last week, increased belching and flatus. Patient seen in clinic today and had Hemoccult test that was positive, sent for further evaluation. Denies abdominal pain, fevers, respiratory symptoms, urinary symptoms, diarrhea. Last stool was this morning and was formed, having 1 stool per day. On 81 mg aspirin daily.    Past Medical History  Diagnosis Date  . Peptic ulcer disease   . Hypertension   . Diverticulitis   . History of colonoscopy   . Onychomycosis   . Mitral valve prolapse   . BPH (benign prostatic hyperplasia)   . Hip pain   . Sepsis ([place]) 2005    e. coli-after prostate bx  . Torn ACL   . Lumbar radiculopathy   . Sleep apnea   . Foot drop, right   . Elevated PSA   . Depression   . Gait disorder 02/03/2015  . HOH (hard of hearing)     hearing aid  . Chronic low back pain   . Foot drop, right    Past Surgical History  Procedure Laterality Date  . Right knee replacement Right 1996  . Cataract extraction Bilateral   . Tonsillectomy     Family History  Problem Relation Age of Onset  . Pneumonia Mother 30  . Cancer - Lung Father 21  . Multiple myeloma Brother 48   Social History  Substance Use Topics  . Smoking status: Never Smoker   . Smokeless tobacco: Never Used  . Alcohol Use: None    Review of Systems  Constitutional: Positive for fatigue. Negative for fever.  HENT: Negative for rhinorrhea.   Eyes:  Negative for visual disturbance.  Respiratory: Negative for shortness of breath.   Cardiovascular: Negative for chest pain.  Gastrointestinal: Positive for blood in stool. Negative for nausea, vomiting, abdominal pain, diarrhea and constipation.  Genitourinary: Negative for decreased urine volume.  Skin: Negative for rash.  Allergic/Immunologic: Negative for immunocompromised state.  Neurological: Positive for weakness (generalized) and headaches. Negative for syncope.  Psychiatric/Behavioral: Negative for confusion.    Allergies  Review of patient's allergies indicates no known allergies.  Home Medications   Prior to Admission medications   Medication Sig Start Date End Date Taking? Authorizing Provider  acetaZOLAMIDE (DIAMOX) 125 MG tablet Take 125 mg by mouth as needed (altitude changes).    Yes Historical Provider, MD  aspirin 81 MG tablet Take 81 mg by mouth daily.   Yes Historical Provider, MD  citalopram (CELEXA) 20 MG tablet Take 20 mg by mouth daily. 12/22/14  Yes Historical Provider, MD  dutasteride (AVODART) 0.5 MG capsule Take 0.5 mg by mouth daily. 01/06/15  Yes Historical Provider, MD  indapamide (LOZOL) 2.5 MG tablet Take 2.5 mg by mouth daily. 01/21/15  Yes Historical Provider, MD  Melatonin 3 MG TABS Take 3 mg by mouth at bedtime.    Yes Historical Provider, MD  Omega-3 Fatty Acids (FISH OIL PO) Take 1,000 mg by mouth  daily.   Yes Historical Provider, MD  Probiotic Product (PROBIOTIC DAILY PO) Take 1 tablet by mouth daily.   Yes Historical Provider, MD  tamsulosin (FLOMAX) 0.4 MG CAPS capsule Take 0.4 mg by mouth daily. 01/06/15  Yes Historical Provider, MD  Turmeric 500 MG CAPS Take 500 mg by mouth daily.   Yes Historical Provider, MD  verapamil (CALAN-SR) 240 MG CR tablet Take 240 mg by mouth daily. 01/21/15  Yes Historical Provider, MD   BP 164/76 mmHg  Pulse 76  Temp(Src) 98.2 F (36.8 C) (Oral)  Resp 19  Ht 5' 10"  (1.778 m)  Wt 200 lb (90.719 kg)  BMI 28.70 kg/m2   SpO2 96% Physical Exam  Constitutional: He is oriented to person, place, and time. He appears well-developed and well-nourished. No distress.  HENT:  Head: Normocephalic and atraumatic.  Eyes: Right eye exhibits no discharge. Left eye exhibits no discharge.  Neck: No tracheal deviation present.  Cardiovascular: Normal rate and regular rhythm.   Pulmonary/Chest: Effort normal and breath sounds normal. No respiratory distress.  Abdominal: Soft. He exhibits no distension. There is no tenderness.  Genitourinary:  External hemorrhoid, soft. Dark stool, hemoccult positive.   Musculoskeletal: He exhibits no edema.  Neurological: He is alert and oriented to person, place, and time.  Skin: Skin is warm and dry.  Psychiatric: He has a normal mood and affect. His behavior is normal.    ED Course  Procedures (including critical care time) Labs Review Labs Reviewed  COMPREHENSIVE METABOLIC PANEL - Abnormal; Notable for the following:    Chloride 98 (*)    Glucose, Bld 102 (*)    Total Protein 5.8 (*)    All other components within normal limits  CBC - Abnormal; Notable for the following:    RBC 3.04 (*)    Hemoglobin 10.1 (*)    HCT 30.2 (*)    All other components within normal limits  BASIC METABOLIC PANEL - Abnormal; Notable for the following:    Chloride 98 (*)    Glucose, Bld 110 (*)    Calcium 8.5 (*)    All other components within normal limits  CBC - Abnormal; Notable for the following:    RBC 2.95 (*)    Hemoglobin 10.1 (*)    HCT 29.5 (*)    MCH 34.2 (*)    All other components within normal limits  HEMOGLOBIN AND HEMATOCRIT, BLOOD - Abnormal; Notable for the following:    Hemoglobin 9.9 (*)    HCT 29.0 (*)    All other components within normal limits  POC OCCULT BLOOD, ED - Abnormal; Notable for the following:    Fecal Occult Bld POSITIVE (*)    All other components within normal limits  HELICOBACTER PYLORI ANTIGEN DET, STOOL  HEMOGLOBIN AND HEMATOCRIT, BLOOD   HEMOGLOBIN AND HEMATOCRIT, BLOOD  TYPE AND SCREEN  ABO/RH    Imaging Review No results found. I have personally reviewed and evaluated these images and lab results as part of my medical decision-making.   EKG Interpretation None      MDM   Final diagnoses:  Gastrointestinal hemorrhage, unspecified gastritis, unspecified gastrointestinal hemorrhage type    81 year old male with history of peptic ulcer disease/H. pylori, diverticulitis, hypertension, mitral valve prolapse, BPH presenting with generalized weakness and fatigue. AF, VSS. Hemoccult positive with Hgb 10 concerning for recurrent likely upper GIB. BUN wnl, feel acute bleeding less likely at this time.   Spoke with Dr. Wallis MartSadie Haber [facility] on call. Plan EGD  tomorrow after period of observation overnight with serial H&H. Admitted to hospitalist service for further care. Given IV protonix, carafate.  Case discussed with Dr. [name] who oversaw management of this patient.   Ivin Booty, MD 08/19/15 1328  Leo Grosser, MD 08/19/15 [phone]

## 2015-08-18 NOTE — ED Notes (Signed)
Pt in c/o dark stools for the last week, generalized fatigue and intermittent dizziness, went to PCP and had a positive hemoccult, sent here for further evaluation, pt alert and oriented, no distress noted

## 2015-08-18 NOTE — ED Provider Notes (Signed)
I saw and evaluated the patient, reviewed the resident's note and I agree with the findings and plan. Please see associated encounter note.   EKG Interpretation None     81 year old male presenting with dark stools, weakness, lightheadedness over the last week. He is a patient with GI. Plan admission to medicine and scope with them.   Leo Grosser, MD 08/19/15 [phone]

## 2015-08-18 NOTE — H&P (Signed)
History and Physical        Hospital Admission Note Date: 08/18/2015  Patient name: Patrick Franco Medical record number: [id number] Date of birth: July 25, 1932 Age: 81 y.o. Gender: male  PCP: Dorian Heckle, MD  Referring physician: Dr Amedeo Gory  Chief Complaint:  dark tarry stools since last Thursday (6 days)  HPI: Patient is a 81 year old male with peptic ulcer disease, hypertension, mitral valve prolapse, chronic low back pain, BPH who presented to ED with episodes of dark tarry stools for the last 6 days. Patient reported that he was traveling to [place] last month and while he was in [place], [place], on Thursday he noticed dark stools every day. Since then he's been feeling intermittent dizziness, fatigued and generalized weakness. The patient returned from [place] on Sunday 2 days ago, went to see his PCP. Heme occult test was positive and patient was sent to the ED for further evaluation.  Patient takes aspirin 81 mg daily, took one advil last week, he does not take NSAIDs daily. Patient reports he had a colonoscopy last year which showed 2 polyps otherwise was normal. EGD in 1990s when he was found to have a ulcer.  CBC showed hemoglobin of 10.0, baseline unknown. BMET unremarkable. FOBT positive. [facility], Dr Cristina Gong was consulted in ED, recommended admission and endoscopy in a.m.  Review of Systems:  Constitutional: Denies fever, chills, diaphoresis, poor appetite and fatigue.  HEENT: Denies photophobia, eye pain, redness, hearing loss, ear pain, congestion, sore throat, rhinorrhea, sneezing, mouth sores, trouble swallowing, neck pain, neck stiffness and tinnitus.   Respiratory: Denies SOB, DOE, cough, chest tightness,  and wheezing.   Cardiovascular: Denies chest pain, palpitations and leg swelling.  Gastrointestinal: Please see history of present illness  Genitourinary: Denies  dysuria, urgency, frequency, hematuria, flank pain and difficulty urinating.  Musculoskeletal: Denies myalgias, back pain, joint swelling, arthralgias and gait problem.  Skin: Denies pallor, rash and wound.  Neurological: +dizziness, seizures, syncope, +weakness, light-headedness, numbness and headaches.  Hematological: Denies adenopathy. Easy bruising, personal or family bleeding history  Psychiatric/Behavioral: Denies suicidal ideation, mood changes, confusion, nervousness, sleep disturbance and agitation  Past Medical History: Past Medical History  Diagnosis Date  . Peptic ulcer disease   . Hypertension   . Diverticulitis   . History of colonoscopy   . Onychomycosis   . Mitral valve prolapse   . BPH (benign prostatic hyperplasia)   . Hip pain   . Sepsis ([place]) 2005    e. coli-after prostate bx  . Torn ACL   . Lumbar radiculopathy   . Sleep apnea   . Foot drop, right   . Elevated PSA   . Depression   . Gait disorder 02/03/2015  . HOH (hard of hearing)     hearing aid  . Chronic low back pain   . Foot drop, right     Past Surgical History  Procedure Laterality Date  . Right knee replacement Right 1996  . Cataract extraction Bilateral   . Tonsillectomy      Medications: Prior to Admission medications   Medication Sig Start Date End Date Taking? Authorizing Provider  acetaZOLAMIDE (DIAMOX) 125 MG tablet Take 125 mg by mouth  as needed (altitude changes).    Yes Historical Provider, MD  aspirin 81 MG tablet Take 81 mg by mouth daily.   Yes Historical Provider, MD  citalopram (CELEXA) 20 MG tablet Take 20 mg by mouth daily. 12/22/14  Yes Historical Provider, MD  dutasteride (AVODART) 0.5 MG capsule Take 0.5 mg by mouth daily. 01/06/15  Yes Historical Provider, MD  indapamide (LOZOL) 2.5 MG tablet Take 2.5 mg by mouth daily. 01/21/15  Yes Historical Provider, MD  Melatonin 3 MG TABS Take 3 mg by mouth at bedtime.    Yes Historical Provider, MD  Omega-3 Fatty Acids (FISH OIL PO)  Take 1,000 mg by mouth daily.   Yes Historical Provider, MD  Probiotic Product (PROBIOTIC DAILY PO) Take 1 tablet by mouth daily.   Yes Historical Provider, MD  tamsulosin (FLOMAX) 0.4 MG CAPS capsule Take 0.4 mg by mouth daily. 01/06/15  Yes Historical Provider, MD  Turmeric 500 MG CAPS Take 500 mg by mouth daily.   Yes Historical Provider, MD  verapamil (CALAN-SR) 240 MG CR tablet Take 240 mg by mouth daily. 01/21/15  Yes Historical Provider, MD    Allergies:  No Known Allergies  Social History:  reports that he has never smoked. He has never used smokeless tobacco. He reports that he does not use illicit drugs. Per patient he drinks 2-3 glasses of wine every night. He lives at home with his wife and is functional with his ADLs.  Family History: Family History  Problem Relation Age of Onset  . Pneumonia Mother 35  . Cancer - Lung Father 92  . Multiple myeloma Brother 72    Physical Exam: Blood pressure 144/80, pulse 78, temperature 97.7 F (36.5 C), temperature source Oral, resp. rate 13, SpO2 97 %. General: Alert, awake, oriented x3, in no acute distress. HEENT: normocephalic, atraumatic, anicteric sclera, pink conjunctiva, pupils equal and reactive to light and accomodation, oropharynx clear Neck: supple, no masses or lymphadenopathy, no goiter, no bruits  Heart: Regular rate and rhythm, 2/6 SEM Lungs: Clear to auscultation bilaterally, no wheezing, rales or rhonchi. Abdomen: Soft, nontender, nondistended, positive bowel sounds, no masses. Extremities: No clubbing, cyanosis or edema with positive pedal pulses. Neuro: Grossly intact, no focal neurological deficits, strength 5/5 upper and lower extremities bilaterally Psych: alert and oriented x 3, normal mood and affect Skin: no rashes or lesions, warm and dry   LABS on Admission:  Basic Metabolic Panel:  Recent Labs Lab 08/18/15 1542  NA 138  K 3.6  CL 98*  CO2 30  GLUCOSE 102*  BUN 18  CREATININE 0.96  CALCIUM 9.0     Liver Function Tests:  Recent Labs Lab 08/18/15 1542  AST 28  ALT 33  ALKPHOS 39  BILITOT 0.9  PROT 5.8*  ALBUMIN 3.7   No results for input(s): LIPASE, AMYLASE in the last 168 hours. No results for input(s): AMMONIA in the last 168 hours. CBC:  Recent Labs Lab 08/18/15 1542  WBC 7.9  HGB 10.1*  HCT 30.2*  MCV 99.3  PLT 229   Cardiac Enzymes: No results for input(s): CKTOTAL, CKMB, CKMBINDEX, TROPONINI in the last 168 hours. BNP: Invalid input(s): POCBNP CBG: No results for input(s): GLUCAP in the last 168 hours.  Radiological Exams on Admission:  No results found.    Assessment/Plan Principal Problem:   Upper GI bleed with known history of peptic ulcer disease, in the setting of daily aspirin - Admit to telemetry, serial H&H, clear liquid diet and NPO after  midnight  - Placed on IV Protonix, Carafate  - GI has been consulted (Dr Cristina Gong), recommended EGD in a.m.    Active Problems:   Peptic ulcer disease - as per #1, continue PPI, Carafate    Hypertension - Continue verapamil    Sleep apnea - CPAP at bedtime    Chronic low back pain - Pain control as needed    BPH (benign prostatic hyperplasia) - Continue Flomax   DVT prophylaxis:  SCDs   CODE STATUS:  full CODE STATUS, discussed with patient   Family Communication: Admission, patients condition and plan of care including tests being ordered have been discussed with the patient and wife who indicates understanding and agree with the plan and Code Status  Disposition plan: Further plan will depend as patient's clinical course evolves and further radiologic and laboratory data become available.    Time Spent on Admission: 67mns   RAI,RIPUDEEP M.D. [facility] 08/18/2015, 6:27 PM Pager: [phone] If 7PM-7AM, please contact night-coverage [email or web address] Password [email or web address]

## 2015-08-19 ENCOUNTER — Observation Stay (HOSPITAL_COMMUNITY): Payer: Medicare Other | Admitting: Certified Registered"

## 2015-08-19 ENCOUNTER — Encounter (HOSPITAL_COMMUNITY): Payer: Self-pay | Admitting: *Deleted

## 2015-08-19 ENCOUNTER — Encounter (HOSPITAL_COMMUNITY)
Admission: EM | Disposition: A | Discharge status: Home / Self Care | Payer: Self-pay | Source: Home / Self Care | Attending: Emergency Medicine

## 2015-08-19 DIAGNOSIS — K259 Gastric ulcer, unspecified as acute or chronic, without hemorrhage or perforation: Secondary | ICD-10-CM | POA: Diagnosis not present

## 2015-08-19 DIAGNOSIS — G4730 Sleep apnea, unspecified: Secondary | ICD-10-CM

## 2015-08-19 DIAGNOSIS — I10 Essential (primary) hypertension: Secondary | ICD-10-CM

## 2015-08-19 DIAGNOSIS — K254 Chronic or unspecified gastric ulcer with hemorrhage: Secondary | ICD-10-CM

## 2015-08-19 DIAGNOSIS — N400 Enlarged prostate without lower urinary tract symptoms: Secondary | ICD-10-CM

## 2015-08-19 DIAGNOSIS — M545 Low back pain: Secondary | ICD-10-CM | POA: Diagnosis not present

## 2015-08-19 DIAGNOSIS — K319 Disease of stomach and duodenum, unspecified: Secondary | ICD-10-CM | POA: Diagnosis not present

## 2015-08-19 DIAGNOSIS — K922 Gastrointestinal hemorrhage, unspecified: Secondary | ICD-10-CM | POA: Diagnosis not present

## 2015-08-19 DIAGNOSIS — K250 Acute gastric ulcer with hemorrhage: Secondary | ICD-10-CM | POA: Diagnosis not present

## 2015-08-19 DIAGNOSIS — G8929 Other chronic pain: Secondary | ICD-10-CM | POA: Diagnosis not present

## 2015-08-19 HISTORY — PX: ESOPHAGOGASTRODUODENOSCOPY: SHX5428

## 2015-08-19 LAB — BASIC METABOLIC PANEL
ANION GAP: 8 (ref 5–15)
BUN: 11 mg/dL (ref 6–20)
CALCIUM: 8.5 mg/dL — AB (ref 8.9–10.3)
CO2: 31 mmol/L (ref 22–32)
Chloride: 98 mmol/L — ABNORMAL LOW (ref 101–111)
Creatinine, Ser: 0.82 mg/dL (ref 0.61–1.24)
GLUCOSE: 110 mg/dL — AB (ref 65–99)
POTASSIUM: 3.5 mmol/L (ref 3.5–5.1)
Sodium: 137 mmol/L (ref 135–145)

## 2015-08-19 LAB — CBC
HEMATOCRIT: 29.5 % — AB (ref 39.0–52.0)
HEMOGLOBIN: 10.1 g/dL — AB (ref 13.0–17.0)
MCH: 34.2 pg — AB (ref 26.0–34.0)
MCHC: 34.2 g/dL (ref 30.0–36.0)
MCV: 100 fL (ref 78.0–100.0)
Platelets: 198 10*3/uL (ref 150–400)
RBC: 2.95 MIL/uL — AB (ref 4.22–5.81)
RDW: 14.5 % (ref 11.5–15.5)
WBC: 5.4 10*3/uL (ref 4.0–10.5)

## 2015-08-19 LAB — HEMOGLOBIN AND HEMATOCRIT, BLOOD
HEMATOCRIT: 28.7 % — AB (ref 39.0–52.0)
HEMOGLOBIN: 9.8 g/dL — AB (ref 13.0–17.0)

## 2015-08-19 SURGERY — EGD (ESOPHAGOGASTRODUODENOSCOPY)
Anesthesia: Monitor Anesthesia Care

## 2015-08-19 MED ORDER — SODIUM CHLORIDE 0.9 % IV SOLN: INTRAVENOUS | Status: DC

## 2015-08-19 MED ORDER — PROPOFOL 10 MG/ML IV BOLUS
INTRAVENOUS | Status: DC | PRN
  Administered 2015-08-19: 20 mg via INTRAVENOUS
  Administered 2015-08-19: 30 mg via INTRAVENOUS

## 2015-08-19 MED ORDER — ESOMEPRAZOLE MAGNESIUM 40 MG PO CPDR: 40.0000 mg | DELAYED_RELEASE_CAPSULE | Freq: Two times a day (BID) | ORAL | Status: DC

## 2015-08-19 MED ORDER — PROPOFOL 500 MG/50ML IV EMUL
INTRAVENOUS | Status: DC | PRN
  Administered 2015-08-19: 100 ug/kg/min via INTRAVENOUS

## 2015-08-19 MED ORDER — SUCRALFATE 1 GM/10ML PO SUSP: 1.0000 g | Freq: Three times a day (TID) | ORAL | Status: DC

## 2015-08-19 MED ORDER — BUTAMBEN-TETRACAINE-BENZOCAINE 2-2-14 % EX AERO
INHALATION_SPRAY | CUTANEOUS | Status: DC | PRN
  Administered 2015-08-19: 1 via TOPICAL

## 2015-08-19 MED ORDER — LACTATED RINGERS IV SOLN
INTRAVENOUS | Status: DC
  Administered 2015-08-19: 1000 mL via INTRAVENOUS

## 2015-08-19 MED ORDER — ESOMEPRAZOLE MAGNESIUM 40 MG PO CPDR: 40.0000 mg | DELAYED_RELEASE_CAPSULE | Freq: Every day | ORAL | Status: DC

## 2015-08-19 NOTE — H&P (View-Only) (Signed)
Referring Provider: Dr. Leo Grosser [facility] ED[facility]) Primary Care Physician:  Dr. Lavone Orn Primary Gastroenterologist:  Dr. Earle Gell  Reason for Consultation:  GI bleeding  HPI: Patrick Franco is a 81 y.o. male with no prior history of GI bleeding, who was seen by the nurse practitioner today at his primary 67 office because of dyspnea on modest exertion, and dizziness, and was noted to have dark, heme positive stool on rectal exam. He has not been on iron or Pepto-Bismol.   There is no past history of ulcer disease but the patient is on a daily aspirin without any ongoing PPI prophylaxis. He was sent to the emergency room, where his hemoglobin was noted to be 10.1, as compared to 15.8 in March 2015 at his primary physician's office. His BUN of 18 is essentially baseline.   He has only been having about 1 stool per day but they have been dark for about 5 days and, as noted, he has been symptomatic, compatible with anemia, for several weeks. He was actually up in [place] duck hunting about a week ago but found it difficult to maintain exertion.   He has not been having any upper tract dyspeptic symptomatology.  The patient has had several colonoscopies through the years, but only his most recent one, in July 2015 by Dr. Howell Rucks, showed any polyps (2 diminutive adenomatous polyps were present). He was noted to have pancolonic diverticulosis.   Past Medical History  Diagnosis Date  . Peptic ulcer disease   . Hypertension   . Diverticulitis   . History of colonoscopy   . Onychomycosis   . Mitral valve prolapse   . BPH (benign prostatic hyperplasia)   . Hip pain   . Sepsis ([place]) 2005    e. coli-after prostate bx  . Torn ACL   . Lumbar radiculopathy   . Sleep apnea   . Foot drop, right   . Elevated PSA   . Depression   . Gait disorder 02/03/2015  . HOH (hard of hearing)     hearing aid  . Chronic low back pain   . Foot drop, right     Past Surgical History  Procedure  Laterality Date  . Right knee replacement Right 1996  . Cataract extraction Bilateral   . Tonsillectomy      Prior to Admission medications   Medication Sig Start Date End Date Taking? Authorizing Provider  acetaZOLAMIDE (DIAMOX) 125 MG tablet Take 125 mg by mouth as needed (altitude changes).    Yes Historical Provider, MD  aspirin 81 MG tablet Take 81 mg by mouth daily.   Yes Historical Provider, MD  citalopram (CELEXA) 20 MG tablet Take 20 mg by mouth daily. 12/22/14  Yes Historical Provider, MD  dutasteride (AVODART) 0.5 MG capsule Take 0.5 mg by mouth daily. 01/06/15  Yes Historical Provider, MD  indapamide (LOZOL) 2.5 MG tablet Take 2.5 mg by mouth daily. 01/21/15  Yes Historical Provider, MD  Melatonin 3 MG TABS Take 3 mg by mouth at bedtime.    Yes Historical Provider, MD  Omega-3 Fatty Acids (FISH OIL PO) Take 1,000 mg by mouth daily.   Yes Historical Provider, MD  Probiotic Product (PROBIOTIC DAILY PO) Take 1 tablet by mouth daily.   Yes Historical Provider, MD  tamsulosin (FLOMAX) 0.4 MG CAPS capsule Take 0.4 mg by mouth daily. 01/06/15  Yes Historical Provider, MD  Turmeric 500 MG CAPS Take 500 mg by mouth daily.   Yes Historical Provider, MD  verapamil (CALAN-SR) 240 MG CR tablet Take 240 mg by mouth daily. 01/21/15  Yes Historical Provider, MD    No current facility-administered medications for this encounter.   Current Outpatient Prescriptions  Medication Sig Dispense Refill  . acetaZOLAMIDE (DIAMOX) 125 MG tablet Take 125 mg by mouth as needed (altitude changes).     [name] aspirin 81 MG tablet Take 81 mg by mouth daily.    . citalopram (CELEXA) 20 MG tablet Take 20 mg by mouth daily.  2  . dutasteride (AVODART) 0.5 MG capsule Take 0.5 mg by mouth daily.  2  . indapamide (LOZOL) 2.5 MG tablet Take 2.5 mg by mouth daily.  1  . Melatonin 3 MG TABS Take 3 mg by mouth at bedtime.     . Omega-3 Fatty Acids (FISH OIL PO) Take 1,000 mg by mouth daily.    . Probiotic Product (PROBIOTIC  DAILY PO) Take 1 tablet by mouth daily.    . tamsulosin (FLOMAX) 0.4 MG CAPS capsule Take 0.4 mg by mouth daily.  2  . Turmeric 500 MG CAPS Take 500 mg by mouth daily.    . verapamil (CALAN-SR) 240 MG CR tablet Take 240 mg by mouth daily.  2    Allergies as of 08/18/2015  . (No Known Allergies)    Family History  Problem Relation Age of Onset  . Pneumonia Mother 59  . Cancer - Lung Father 60  . Multiple myeloma Brother 91    Social History   Social History  . Marital Status: Married    Spouse Name: N/A  . Number of Children: N/A  . Years of Education: N/A   Occupational History  . Not on file.   Social History Main Topics  . Smoking status: Never Smoker   . Smokeless tobacco: Never Used  . Alcohol Use: Not on file  . Drug Use: No  . Sexual Activity: Not on file   Other Topics Concern  . Not on file   Social History Narrative   Patient is right handed.   Patient drinks 1 cup of coffee per day.    Review of Systems: Positive for dyspnea on exertion, mild dizziness, numbness in his feet, dark stools. Patient has sleep apnea and uses CPAP.  Physical Exam: Vital signs in last 24 hours: Temp:  [97.7 F (36.5 C)] 97.7 F (36.5 C) (10/04 1517) Pulse Rate:  [73-78] 78 (10/04 1715) Resp:  [13-18] 13 (10/04 1715) BP: (134-150)/(54-80) 144/80 mmHg (10/04 1715) SpO2:  [95 %-97 %] 97 % (10/04 1715)   General:   Alert,  Well-developed, moderately overweight, pleasant and cooperative in absolutely NAD Head:  Normocephalic and atraumatic. Eyes:  Sclera clear, no icterus.   Conjunctiva pink. Neck:   No masses or thyromegaly. Lungs:  Clear throughout to auscultation.   No wheezes, crackles, or rhonchi. No evident respiratory distress. Heart:   Regular rate and rhythm; soft systolic murmur. Abdomen: Nontympanitic, and nondistended. No masses or hepatosplenomegaly . Normal bowel sounds, without bruits, guarding, or rebound.  Small, reducible umbilical hernia present. Rectal:   Per ER staff, showed dark, heme positive stool   Msk:   Symmetrical without gross deformities. Pulses:  Normal radial pulse is noted. Extremities:   Without clubbing, cyanosis, or edema. Neurologic:  Alert and coherent;  grossly normal neurologically. Skin:  Intact without significant lesions or rashes. Warm and dry. Cervical Nodes:  No significant cervical adenopathy. Psych:   Alert and cooperative. Normal mood and affect.  Intake/Output from previous  day:   Intake/Output this shift:    Lab Results:  Recent Labs  08/18/15 1542  WBC 7.9  HGB 10.1*  HCT 30.2*  PLT 229   BMET  Recent Labs  08/18/15 1542  NA 138  K 3.6  CL 98*  CO2 30  GLUCOSE 102*  BUN 18  CREATININE 0.96  CALCIUM 9.0   LFT  Recent Labs  08/18/15 1542  PROT 5.8*  ALBUMIN 3.7  AST 28  ALT 33  ALKPHOS 39  BILITOT 0.9   PT/INR No results for input(s): LABPROT, INR in the last 72 hours.  Studies/Results: No results found.  Impression: 1. Dark, heme positive stool without evidence of active GI bleeding (normal BUN, bowel movements are not frequent or liquid in character) 2. Anemia, presumably subacute (MCV normal), with 5 g drop in hemoglobin over the past year. 3. Aspirin exposure (daily 81 mg) 4. History of pancolonic diverticulosis and 2 diminutive adenomatous polyps on last year's colonoscopy.  Plan: 1. Initiate PPI therapy and give single dose of sucralfate 2. Endoscopy tomorrow by our hospital physician. Petra Kuba, purpose, and risks of the procedure were reviewed with the patient and his wife who is at the bedside. They are somewhat familiar from a prior endoscopy done by Dr. Lyla Son roughly 20 years ago. 3. I explained that overnight observation is appropriate in view of the presence of dark stool, heme positivity, and the significant drop in hemoglobin, even though it appears that this has probably been a gradual blood loss. Assuming that tomorrow's endoscopy does not show an ulcer  that appears to be at high risk for further bleeding, discharge following the endoscopy would probably be appropriate. 4. Ultimately, a decision will have to be made as to whether this patient really needs is chronic low-dose aspirin therapy. He has no history of coronary disease and, especially if aspirin related GI bleeding is confirmed on tomorrow's endoscopy, the risk-benefit ratio may not favor continued aspirin therapy. This decision might be deferred to the patient's primary physician.     [place] V  08/18/2015, 6:57 PM   Pager [phone] If no answer or after 5 PM call [phone]

## 2015-08-19 NOTE — Anesthesia Postprocedure Evaluation (Signed)
  Anesthesia Post-op Note  Patient: Patrick Franco  Procedure(s) Performed: Procedure(s): ESOPHAGOGASTRODUODENOSCOPY (EGD) (N/A)  Patient Location: Endoscopy Unit  Anesthesia Type:MAC  Level of Consciousness: awake, alert  and oriented  Airway and Oxygen Therapy: Patient Spontanous Breathing  Post-op Pain: none  Post-op Assessment: Post-op Vital signs reviewed, Patient's Cardiovascular Status Stable, Respiratory Function Stable, Patent Airway and No signs of Nausea or vomiting              Post-op Vital Signs: Reviewed and stable  Last Vitals:  Filed Vitals:   08/19/15 1336  BP: 96/33  Pulse: 65  Temp: 36.9 C  Resp: 17    Complications: No apparent anesthesia complications

## 2015-08-19 NOTE — Progress Notes (Signed)
Patient discharged home with wife. Instructions given, wife present for teaching. Electronic prescriptions sent. IV and tele removed at discharge. No more needs from care management and/or social work. Skin intact. Patient stable, on room air. Earleen Reaper, RN

## 2015-08-19 NOTE — Interval H&P Note (Signed)
History and Physical Interval Note:  08/19/2015 12:44 PM  Patrick Franco  has presented today for surgery, with the diagnosis of gastrointestinal bleeding  The various methods of treatment have been discussed with the patient and family. After consideration of risks, benefits and other options for treatment, the patient has consented to  Procedure(s): ESOPHAGOGASTRODUODENOSCOPY (EGD) (N/A) as a surgical intervention .  The patient's history has been reviewed, patient examined, no change in status, stable for surgery.  I have reviewed the patient's chart and labs.  Questions were answered to the patient's satisfaction.      JR, L

## 2015-08-19 NOTE — Care Management Note (Signed)
Case Management Note  Patient Details  Name: STEPEHN ECKARD MRN: [id number] Date of Birth: 1932-10-02  Subjective/Objective:           Date-08-19-15 Wednesday Initial Assessment  Spoke with patient at the bedside along with wife.  Introduced self as Tourist information centre manager and explained role in discharge planning and how to be reached.  Verified patient lives at [place] in an [place] home.  Verified patient anticipates to go home with spouse at time of discharge and will have  part-time supervision by family friends neighbors at this time to best of their knowledge.  Patient has DME . Expressed potential need for no other DME.  Patient denied c needing help with their medication.  Patient drives to MD appointments.  Verified patient has PCP Patient states they currently receive [facility] services through no one.    Plan: CM will continue to follow for discharge planning and [facility] resources.   Carles Collet RN BSN CM [phone]          Action/Plan:   Expected Discharge Date:                  Expected Discharge Plan:  Home/Self Care ([facility])  In-House Referral:     Discharge planning Services  CM Consult  Post Acute Care Choice:    Choice offered to:     DME Arranged:    DME Agency:     HH Arranged:    HH Agency:     Status of Service:  In process, will continue to follow  Medicare Important Message Given:    Date Medicare IM Given:    Medicare IM give by:    Date Additional Medicare IM Given:    Additional Medicare Important Message give by:     If discussed at [place] of Stay Meetings, dates discussed:    Additional Comments:  Carles Collet, RN 08/19/2015, 2:34 PM

## 2015-08-19 NOTE — Op Note (Signed)
[facility] [place], [id number]   ENDOSCOPY PROCEDURE REPORT  PATIENT: Cleofas, Hudgins  MR#: [id number] BIRTHDATE: October 20, 1932 , 82  yrs. old GENDER: male ENDOSCOPIST: Oletta Lamas, MD REFERRED BY: [facility]. PROCEDURE DATE:  2015-08-25 PROCEDURE:   EGD and biopsy ASA CLASS:    class III INDICATIONS: subacute G.I. bleeding MEDICATION: propofol 60 mg IV TOPICAL ANESTHETIC:   Cetacaine Spray  DESCRIPTION OF PROCEDURE:   After the risks and benefits of the procedure were explained, informed consent was obtained.  The Pentax Gastroscope [id number]  endoscope was introduced through the mouth  and advanced to the second portion of the duodenum .  The instrument was slowly withdrawn as the mucosa was fully examined. Estimated blood loss is zero unless otherwise noted in this procedure report. The duodenum including the 2nd duodenum in the duodenal bulb were normal. The scope was withdrawn back into the stomach and there was ulcerated area surrounded by edematous foals mass effect only anterior antrum wall. Multiple biopsies were obtained. The remainder the stomach was seen well was normal. It was examined in the forward and in the retro flex view. The distal and proximal esophagus were seen well upon removal were normal. Patient tolerated the procedure well.    The scope was then withdrawn from the patient and the procedure completed.  COMPLICATIONS: There were no immediate complications.  ENDOSCOPIC IMPRESSION: 1. Gastric Ulcer anterior wall of the [place] biopsy RECOMMENDATIONS: would keep on PPI therapy. We'll go ahead resume diet and could probably be discharged later today or tomorrow with follow-up in 4 to 6 weeks.   _______________________________ Lorrin MaisLaurence Spates, MD 2015/08/25 1:36 PM     cc:  CPT CODES: ICD CODES:  The ICD and CPT codes recommended by this software are interpretations from the data that the  clinical staff has captured with the software.  The verification of the translation of this report to the ICD and CPT codes and modifiers is the sole responsibility of the health care institution and practicing physician where this report was generated.  [employer] will not be held responsible for the validity of the ICD and CPT codes included on this report.  AMA assumes no liability for data contained or not contained herein. CPT is a Designer, television/film set of the [employer].  PATIENT NAME:  Danni, [place] MR#: [id number]

## 2015-08-19 NOTE — Discharge Summary (Signed)
Physician Discharge Summary  Patrick Franco WEX:[id number] DOB: 08-27-1932 DOA: 08/18/2015  PCP: Irven Shelling, MD  Admit date: 08/18/2015 Discharge date: 08/19/2015  Time spent: 55 minutes  Recommendations for Outpatient Follow-up:  1. GI follow-up in 4-6 weeks   Discharge Condition: Stable   Discharge Diagnoses:  Principal Problem:   Gastric ulcer with hemorrhage Active Problems:   Hypertension   Sleep apnea   Chronic low back pain   BPH (benign prostatic hyperplasia)   History of present illness:  This is an 81 y/o male with PUD,HTN, MVP, chronic lower back pain who presents with tarry black stools for about 6 days prior to admission. He admitted to having dizziness, feeling lightheaded and weak. Fecal occult blood was found to be positive. He does take a baby aspirin daily. No use of frequent NSAIDs.  Hospital Course:  Upper GI bleed -history of peptic ulcer disease in the past -Aspirin on hold -No further bloody stools as of yesterday morning -Evaluated by equal GI-EGD today reveals a gastric ulcer -continue to hold aspirin -Continue PPI and Carafate -Follow up with GI in 4-6 weeks  Anemia, unspecified -Appears to be chronic-no significant drop noted during hospital stay -As we do not have any old blood work, I cannot compare with prior hemoglobins to note if there is acute blood loss anemia  Hypertension -Continue verapamil  BPH -Continue Flomax  Procedures:  EGD  Consultations:  [facility]  Discharge Exam: Filed Weights   08/18/15 2056  Weight: 90.719 kg (200 lb)   Filed Vitals:   08/19/15 1409  BP: 113/57  Pulse: 63  Temp:   Resp: 14    General: AAO x 3, no distress Cardiovascular: RRR, no murmurs  Respiratory: clear to auscultation bilaterally GI: soft, non-tender, non-distended, bowel sound positive  Discharge Instructions You were cared for by a hospitalist during your hospital stay. If you have any questions about your discharge  medications or the care you received while you were in the hospital after you are discharged, you can call the unit and asked to speak with the hospitalist on call if the hospitalist that took care of you is not available. Once you are discharged, your primary care physician will handle any further medical issues. Please note that NO REFILLS for any discharge medications will be authorized once you are discharged, as it is imperative that you return to your primary care physician (or establish a relationship with a primary care physician if you do not have one) for your aftercare needs so that they can reassess your need for medications and monitor your lab values.      Discharge Instructions    Diet - low sodium heart healthy    Complete by:  As directed      Increase activity slowly    Complete by:  As directed             Medication List    STOP taking these medications        aspirin 81 MG tablet      TAKE these medications        acetaZOLAMIDE 125 MG tablet  Commonly known as:  DIAMOX  Take 125 mg by mouth as needed (altitude changes).     citalopram 20 MG tablet  Commonly known as:  CELEXA  Take 20 mg by mouth daily.     dutasteride 0.5 MG capsule  Commonly known as:  AVODART  Take 0.5 mg by mouth daily.     esomeprazole  40 MG capsule  Commonly known as:  NEXIUM  Take 1 capsule (40 mg total) by mouth 2 (two) times daily before a meal.     FISH OIL PO  Take 1,000 mg by mouth daily.     indapamide 2.5 MG tablet  Commonly known as:  LOZOL  Take 2.5 mg by mouth daily.     Melatonin 3 MG Tabs  Take 3 mg by mouth at bedtime.     PROBIOTIC DAILY PO  Take 1 tablet by mouth daily.     sucralfate 1 GM/10ML suspension  Commonly known as:  CARAFATE  Take 10 mLs (1 g total) by mouth 4 (four) times daily -  with meals and at bedtime.     tamsulosin 0.4 MG Caps capsule  Commonly known as:  FLOMAX  Take 0.4 mg by mouth daily.     Turmeric 500 MG Caps  Take 500 mg by  mouth daily.     verapamil 240 MG CR tablet  Commonly known as:  CALAN-SR  Take 240 mg by mouth daily.       No Known Allergies    The results of significant diagnostics from this hospitalization (including imaging, microbiology, ancillary and laboratory) are listed below for reference.    Significant Diagnostic Studies: No results found.  Microbiology: No results found for this or any previous visit (from the past 240 hour(s)).   Labs: Basic Metabolic Panel:  Recent Labs Lab 08/18/15 1542 08/19/15 0500  NA 138 137  K 3.6 3.5  CL 98* 98*  CO2 30 31  GLUCOSE 102* 110*  BUN 18 11  CREATININE 0.96 0.82  CALCIUM 9.0 8.5*   Liver Function Tests:  Recent Labs Lab 08/18/15 1542  AST 28  ALT 33  ALKPHOS 39  BILITOT 0.9  PROT 5.8*  ALBUMIN 3.7   No results for input(s): LIPASE, AMYLASE in the last 168 hours. No results for input(s): AMMONIA in the last 168 hours. CBC:  Recent Labs Lab 08/18/15 1542 08/18/15 2125 08/19/15 0500 08/19/15 1437  WBC 7.9  --  5.4  --   HGB 10.1* 9.9* 10.1* 9.8*  HCT 30.2* 29.0* 29.5* 28.7*  MCV 99.3  --  100.0  --   PLT 229  --  198  --    Cardiac Enzymes: No results for input(s): CKTOTAL, CKMB, CKMBINDEX, TROPONINI in the last 168 hours. BNP: BNP (last 3 results) No results for input(s): BNP in the last 8760 hours.  ProBNP (last 3 results) No results for input(s): PROBNP in the last 8760 hours.  CBG: No results for input(s): GLUCAP in the last 168 hours.     SignedDebbe Odea, MD [facility] 08/19/2015, 3:05 PM

## 2015-08-19 NOTE — Anesthesia Preprocedure Evaluation (Addendum)
Anesthesia Evaluation  Patient identified by MRN, date of birth, ID band Patient awake    Reviewed: Allergy & Precautions, NPO status , Patient's Chart, lab work & pertinent test results  History of Anesthesia Complications Negative for: history of anesthetic complications  Airway Mallampati: II   Neck ROM: Full    Dental   Pulmonary sleep apnea ,    breath sounds clear to auscultation       Cardiovascular hypertension,  Rhythm:Regular Rate:Normal     Neuro/Psych  Neuromuscular disease    GI/Hepatic PUD, Likely GI bleed   Endo/Other    Renal/GU      Musculoskeletal   Abdominal   Peds  Hematology  (+) anemia ,   Anesthesia Other Findings   Reproductive/Obstetrics                            Anesthesia Physical Anesthesia Plan  ASA: III  Anesthesia Plan: MAC   Post-op Pain Management:    Induction: Intravenous  Airway Management Planned: Natural Airway  Additional Equipment:   Intra-op Plan:   Post-operative Plan:   Informed Consent: I have reviewed the patients History and Physical, chart, labs and discussed the procedure including the risks, benefits and alternatives for the proposed anesthesia with the patient or authorized representative who has indicated his/her understanding and acceptance.     Plan Discussed with:   Anesthesia Plan Comments:        Anesthesia Quick Evaluation

## 2015-08-19 NOTE — Transfer of Care (Signed)
Immediate Anesthesia Transfer of Care Note  Patient: Patrick Franco  Procedure(s) Performed: Procedure(s): ESOPHAGOGASTRODUODENOSCOPY (EGD) (N/A)  Patient Location: Endoscopy Unit  Anesthesia Type:MAC  Level of Consciousness: awake, alert  and oriented  Airway & Oxygen Therapy: Patient Spontanous Breathing  Post-op Assessment: Report given to RN and Post -op Vital signs reviewed and stable  Post vital signs: Reviewed and stable  Last Vitals:  Filed Vitals:   08/19/15 1336  BP: 96/33  Pulse: 65  Temp: 36.9 C  Resp: 17    Complications: No apparent anesthesia complications

## 2015-08-20 ENCOUNTER — Encounter (HOSPITAL_COMMUNITY): Payer: Self-pay | Admitting: Gastroenterology

## 2015-08-25 DIAGNOSIS — K922 Gastrointestinal hemorrhage, unspecified: Secondary | ICD-10-CM | POA: Diagnosis not present

## 2015-08-25 DIAGNOSIS — F329 Major depressive disorder, single episode, unspecified: Secondary | ICD-10-CM | POA: Diagnosis not present

## 2015-08-25 DIAGNOSIS — K259 Gastric ulcer, unspecified as acute or chronic, without hemorrhage or perforation: Secondary | ICD-10-CM | POA: Diagnosis not present

## 2015-08-25 DIAGNOSIS — R269 Unspecified abnormalities of gait and mobility: Secondary | ICD-10-CM | POA: Diagnosis not present

## 2015-08-25 DIAGNOSIS — I10 Essential (primary) hypertension: Secondary | ICD-10-CM | POA: Diagnosis not present

## 2015-08-25 DIAGNOSIS — G4733 Obstructive sleep apnea (adult) (pediatric): Secondary | ICD-10-CM | POA: Diagnosis not present

## 2015-09-11 ENCOUNTER — Ambulatory Visit (INDEPENDENT_AMBULATORY_CARE_PROVIDER_SITE_OTHER): Payer: Medicare Other

## 2015-09-11 DIAGNOSIS — Z23 Encounter for immunization: Secondary | ICD-10-CM

## 2015-09-21 ENCOUNTER — Ambulatory Visit (INDEPENDENT_AMBULATORY_CARE_PROVIDER_SITE_OTHER)
Admission: RE | Admit: 2015-09-21 | Discharge: 2015-09-21 | Disposition: A | Discharge status: Home / Self Care | Payer: Medicare Other | Source: Ambulatory Visit | Attending: Family Medicine | Admitting: Family Medicine

## 2015-09-21 ENCOUNTER — Encounter: Payer: Self-pay | Admitting: Family Medicine

## 2015-09-21 ENCOUNTER — Ambulatory Visit (INDEPENDENT_AMBULATORY_CARE_PROVIDER_SITE_OTHER): Payer: Medicare Other | Admitting: Family Medicine

## 2015-09-21 VITALS — BP 118/70 | HR 61 | Ht 69.0 in | Wt 207.0 lb

## 2015-09-21 DIAGNOSIS — M6289 Other specified disorders of muscle: Secondary | ICD-10-CM | POA: Diagnosis not present

## 2015-09-21 DIAGNOSIS — M545 Low back pain: Secondary | ICD-10-CM | POA: Diagnosis not present

## 2015-09-21 DIAGNOSIS — M47816 Spondylosis without myelopathy or radiculopathy, lumbar region: Secondary | ICD-10-CM | POA: Diagnosis not present

## 2015-09-21 DIAGNOSIS — G8929 Other chronic pain: Secondary | ICD-10-CM

## 2015-09-21 DIAGNOSIS — M5416 Radiculopathy, lumbar region: Secondary | ICD-10-CM

## 2015-09-21 MED ORDER — GABAPENTIN 100 MG PO CAPS: 200.0000 mg | ORAL_CAPSULE | Freq: Every day | ORAL | Status: DC

## 2015-09-21 NOTE — Progress Notes (Signed)
Pre visit review using our clinic review tool, if applicable. No additional management support is needed unless otherwise documented below in the visit note. 

## 2015-09-21 NOTE — Patient Instructions (Signed)
Good to see you both.  Ask Dr. Oletta Lamas about iron and would recommend 65mg  daily We will get xray of back today.  Vitamin D 2000-4000 IU daily Fish oil 1-2 grams daily pennsaid pinkie amount topically 2 times daily as needed.  Protein 1 gram per pound daily  See me again in 3 weeks.

## 2015-09-21 NOTE — Assessment & Plan Note (Signed)
I do believe the patient's low back pain is likely secondary to more of the degenerative disc disease as well as scoliosis causing possibly a spinal stenosis getting patient radicular symptoms as well as the numbness in his feet. I do feel that patient needs further workup and x-rays ordered today. Patient will continue his medications. We discussed over-the-counter natural supplementations a could be helpful. We discussed icing regimen and home exercises. Patient will try to make these changes and come back and see me again in 3 weeks.

## 2015-09-21 NOTE — Progress Notes (Signed)
[name] [address], [place] [address] Phone: [phone] Subjective:    I'm seeing this patient by the request  of:  Irven Shelling, MD   CC: arthralgias and fatigue  DJS:[id number] Patrick Franco is a 81 y.o. male coming in with complaint of left hip pain. Patient states he is actually having pain in multiple joints. Patient states that ever since his GI bleed last month he has had some difficulty increasing his endurance. Feels that this is been going on for quite some time though maybe even before this. 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 Seems that it is more of his back, left hip, bilateral knees, as well as numbness in his feet. Patient states standing long amount of time or walking for long amount of time makes the numbness worse. Denies significant weakness but would state that there is fatigue in his legs. Patient does not remember any true injury. Has had multiple different orthopedic problem before as well as having a total knee replacement on the right side. Patient would like to remain active but feels like he is unable to do so. No significant other changes such as chest pain, but does state that he has some shortness of breath. Patient just feels like he wants to sit around more frequently.  Past Medical History  Diagnosis Date  . Peptic ulcer disease   . Hypertension   . Diverticulitis   . History of colonoscopy   . Onychomycosis   . Mitral valve prolapse   . BPH (benign prostatic hyperplasia)   . Hip pain   . Sepsis ([place]) 2005    e. coli-after prostate bx  . Torn ACL   . Lumbar radiculopathy   . Sleep apnea   . Foot drop, right   . Elevated PSA   . Depression   . Gait disorder 02/03/2015  . HOH (hard of hearing)     hearing aid  . Chronic low back pain   . Foot drop, right    Past Surgical History  Procedure Laterality Date  . Right  knee replacement Right 1996  . Cataract extraction Bilateral   . Tonsillectomy    . Esophagogastroduodenoscopy N/A 08/19/2015    Procedure: ESOPHAGOGASTRODUODENOSCOPY (EGD);  Surgeon: Laurence Spates, MD;  Location: [facility] ENDOSCOPY;  Service: Endoscopy;  Laterality: N/A;   Social History  Substance Use Topics  . Smoking status: Never Smoker   . Smokeless tobacco: Never Used  . Alcohol Use: None   No Known Allergies Family History  Problem Relation Age of Onset  . Pneumonia Mother 14  . Cancer - Lung Father 20  . Multiple myeloma Brother 97       Past Medical History  Diagnosis Date  . Peptic ulcer disease   . Hypertension   . Diverticulitis   . History of colonoscopy   . Onychomycosis   . Mitral valve prolapse   . BPH (benign prostatic hyperplasia)   . Hip pain   . Sepsis ([place]) 2005    e. coli-after prostate bx  . Torn ACL   . Lumbar radiculopathy   . Sleep apnea   . Foot drop, right   . Elevated PSA   . Depression   . Gait disorder 02/03/2015  . HOH (hard of hearing)     hearing aid  . Chronic low back pain   . Foot  drop, right    Past Surgical History  Procedure Laterality Date  . Right knee replacement Right 1996  . Cataract extraction Bilateral   . Tonsillectomy    . Esophagogastroduodenoscopy N/A 08/19/2015    Procedure: ESOPHAGOGASTRODUODENOSCOPY (EGD);  Surgeon: Laurence Spates, MD;  Location: [facility] ENDOSCOPY;  Service: Endoscopy;  Laterality: N/A;   Social History  Substance Use Topics  . Smoking status: Never Smoker   . Smokeless tobacco: Never Used  . Alcohol Use: None   No Known Allergies Family History  Problem Relation Age of Onset  . Pneumonia Mother 37  . Cancer - Lung Father 51  . Multiple myeloma Brother 20    Past medical history, social, surgical and family history all reviewed in electronic medical record.   Review of Systems: No headache, visual changes, nausea, vomiting, diarrhea, constipation, dizziness, abdominal pain, skin rash,  fevers, chills, night sweats, weight loss, swollen lymph nodes, body aches, joint swelling, muscle aches, chest pain, shortness of breath, mood changes.   Objective Blood pressure 118/70, pulse 61, height 5' 9"  (1.753 m), weight 207 lb (93.895 kg), SpO2 96 %.  General: No apparent distress alert and oriented x3 mood and affect normal, dressed appropriately.  HEENT: Pupils equal, extraocular movements intact  Respiratory: Patient's speak in full sentences and does not appear short of breath  Cardiovascular: No lower extremity edema, non tender, no erythema  Skin: Warm dry intact with no signs of infection or rash on extremities or on axial skeleton.  Abdomen: Soft nontender  Neuro: Cranial nerves II through XII are intact, neurovascularly intact in all extremities with 2+ DTRs and 2+ pulses.  Lymph: No lymphadenopathy of posterior or anterior cervical chain or axillae bilaterally.  Gait antalgic with mild right foot drop MSK:  Non tender with full range of motion and good stability and symmetric strength and tone of shoulders, elbows, wrist, , knee and ankles bilaterally.  Back Exam:  Inspection:Lumbar scoliosis noted. Motion: Flexion 25 deg, Extension 15 deg, Side Bending to 15 deg bilaterally,  Rotation to 15 deg bilaterally  SLR laying: Negative  XSLR laying: Negative  Palpable tenderness: discomfort noted in the paraspinal musculature of the lumbar spine bilaterally FABER: negative. Sensory change: Gross sensation intact to all lumbar and sacral dermatomes.  Reflexes: 2+ at both patellar tendons, 2+ at achilles tendons, Babinski's downgoing. Patient though does have loss of hair of his lower extremity bilaterally Strength at foot  Plantar-flexion: 5/5 Dorsi-flexion: 5/5 Eversion: 5/5 Inversion: 5/5  Leg strength  Quad: 5/5 Hamstring: 5/5 Hip flexor: 5/5 Hip abductors: 5/5      Impression and Recommendations:     This case required medical decision making of moderate  complexity.

## 2015-09-21 NOTE — Assessment & Plan Note (Signed)
Patient's muscle fatigue is likely multifactorial. Patient's gastric ulcer with hemorrhage I am concerned the patient does have an iron deficiency anemia. Discussed possibly starting with some iron replacement but patient will discuss with gastroenterology. Different diet changes that could be beneficial as well as increasing protein intake. We discussed vitamin D deficiency possibly causing some of this concern as well. Patient given topical anti-inflammatories that I hope will be beneficial. We discussed that there is some underlying arthritis of the back likely end of the spinal stenosis is also contributing we may need to consider further workup such as ABI or even possibly MRI for possible epidural injections. Patient given gabapentin to help with some other neurologic symptoms. Patient will come back and see me again in 3-4 weeks for further evaluation.

## 2015-09-24 DIAGNOSIS — K259 Gastric ulcer, unspecified as acute or chronic, without hemorrhage or perforation: Secondary | ICD-10-CM | POA: Diagnosis not present

## 2015-10-01 DIAGNOSIS — H52223 Regular astigmatism, bilateral: Secondary | ICD-10-CM | POA: Diagnosis not present

## 2015-10-01 DIAGNOSIS — H26492 Other secondary cataract, left eye: Secondary | ICD-10-CM | POA: Diagnosis not present

## 2015-10-01 DIAGNOSIS — H5203 Hypermetropia, bilateral: Secondary | ICD-10-CM | POA: Diagnosis not present

## 2015-10-01 DIAGNOSIS — H04203 Unspecified epiphora, bilateral lacrimal glands: Secondary | ICD-10-CM | POA: Diagnosis not present

## 2015-10-01 DIAGNOSIS — H524 Presbyopia: Secondary | ICD-10-CM | POA: Diagnosis not present

## 2015-10-01 DIAGNOSIS — H353131 Nonexudative age-related macular degeneration, bilateral, early dry stage: Secondary | ICD-10-CM | POA: Diagnosis not present

## 2015-10-01 DIAGNOSIS — Z961 Presence of intraocular lens: Secondary | ICD-10-CM | POA: Diagnosis not present

## 2015-10-12 ENCOUNTER — Encounter: Payer: Self-pay | Admitting: Family Medicine

## 2015-10-12 ENCOUNTER — Ambulatory Visit (INDEPENDENT_AMBULATORY_CARE_PROVIDER_SITE_OTHER): Payer: Medicare Other | Admitting: Family Medicine

## 2015-10-12 VITALS — BP 118/74 | HR 72 | Ht 69.0 in | Wt 209.0 lb

## 2015-10-12 DIAGNOSIS — M545 Low back pain, unspecified: Secondary | ICD-10-CM

## 2015-10-12 DIAGNOSIS — G8929 Other chronic pain: Secondary | ICD-10-CM | POA: Diagnosis not present

## 2015-10-12 NOTE — Assessment & Plan Note (Signed)
Patient is doing very well at this time. Please see patient instructions otherwise. Patient is on a low dose of gabapentin will continue the 200 mg daily that seems to be making improvement with some of the neuropathy symptoms. We discussed continuing the vitamin supplementation. We discussed range of motion exercises. We discussed proper shoewear. Patient and will come back and see me again in 6 weeks to make sure he continues to improve. Patient will be going on a hiking trip which will be a true test of endurance.  Spent  25 minutes with patient face-to-face and had greater than 50% of counseling including as described above in assessment and plan.

## 2015-10-12 NOTE — Progress Notes (Signed)
Pre visit review using our clinic review tool, if applicable. No additional management support is needed unless otherwise documented below in the visit note. 

## 2015-10-12 NOTE — Progress Notes (Signed)
[facility] [address] [place], [place] [address] Phone: [phone] Subjective:    I'm seeing this patient by the request  of:  Irven Shelling, MD   CC: arthralgias and low back pain follow-up  Patrick Franco is a 81 y.o. male coming in with complaint of left hip pain. It appears the patient's pain was likely coming from his back. X-rays that show moderate osteoarthritic changes. Patient states though that since he's made changes with increasing the stretches, starting some vitamins, and watch his diet he has made approximately 30% improvement. States that he is playing tennis better than he has recently. Doing the stretches fairly regularly. No significant side effects of any other medications. Did not start the iron per his gastroenterology doctor at this moment.  Past Medical History  Diagnosis Date  . Peptic ulcer disease   . Hypertension   . Diverticulitis   . History of colonoscopy   . Onychomycosis   . Mitral valve prolapse   . BPH (benign prostatic hyperplasia)   . Hip pain   . Sepsis ([place]) 2005    e. coli-after prostate bx  . Torn ACL   . Lumbar radiculopathy   . Sleep apnea   . Foot drop, right   . Elevated PSA   . Depression   . Gait disorder 02/03/2015  . HOH (hard of hearing)     hearing aid  . Chronic low back pain   . Foot drop, right    Past Surgical History  Procedure Laterality Date  . Right knee replacement Right 1996  . Cataract extraction Bilateral   . Tonsillectomy    . Esophagogastroduodenoscopy N/A 08/19/2015    Procedure: ESOPHAGOGASTRODUODENOSCOPY (EGD);  Surgeon: Laurence Spates, MD;  Location: [facility] ENDOSCOPY;  Service: Endoscopy;  Laterality: N/A;   Social History  Substance Use Topics  . Smoking status: Never Smoker   . Smokeless tobacco: Never Used  . Alcohol Use: None   No Known Allergies Family History  Problem Relation Age of Onset  . Pneumonia Mother 68  . Cancer - Lung Father 52  .  Multiple myeloma Brother 28       Past Medical History  Diagnosis Date  . Peptic ulcer disease   . Hypertension   . Diverticulitis   . History of colonoscopy   . Onychomycosis   . Mitral valve prolapse   . BPH (benign prostatic hyperplasia)   . Hip pain   . Sepsis ([place]) 2005    e. coli-after prostate bx  . Torn ACL   . Lumbar radiculopathy   . Sleep apnea   . Foot drop, right   . Elevated PSA   . Depression   . Gait disorder 02/03/2015  . HOH (hard of hearing)     hearing aid  . Chronic low back pain   . Foot drop, right    Past Surgical History  Procedure Laterality Date  . Right knee replacement Right 1996  . Cataract extraction Bilateral   . Tonsillectomy    . Esophagogastroduodenoscopy N/A 08/19/2015    Procedure: ESOPHAGOGASTRODUODENOSCOPY (EGD);  Surgeon: Laurence Spates, MD;  Location: [facility] ENDOSCOPY;  Service: Endoscopy;  Laterality: N/A;   Social History  Substance Use Topics  . Smoking status: Never Smoker   . Smokeless tobacco: Never Used  . Alcohol Use: None   No Known Allergies Family History  Problem Relation Age of Onset  . Pneumonia Mother 80  . Cancer - Lung Father 32  .  Multiple myeloma Brother 65    Past medical history, social, surgical and family history all reviewed in electronic medical record.   Review of Systems: No headache, visual changes, nausea, vomiting, diarrhea, constipation, dizziness, abdominal pain, skin rash, fevers, chills, night sweats, weight loss, swollen lymph nodes, body aches, joint swelling, muscle aches, chest pain, shortness of breath, mood changes.   Objective Blood pressure 118/74, pulse 72, height _0  (1.753 m), weight 209 lb (94.802 kg), SpO2 95 %.  General: No apparent distress alert and oriented x3 mood and affect normal, dressed appropriately.  HEENT: Pupils equal, extraocular movements intact  Respiratory: Patient's speak in full sentences and does not appear short of breath  Cardiovascular: No lower  extremity edema, non tender, no erythema  Skin: Warm dry intact with no signs of infection or rash on extremities or on axial skeleton.  Abdomen: Soft nontender  Neuro: Cranial nerves II through XII are intact, neurovascularly intact in all extremities with 2+ DTRs and 2+ pulses.  Lymph: No lymphadenopathy of posterior or anterior cervical chain or axillae bilaterally.  Gait antalgic with mild right foot drop MSK:  Non tender with full range of motion and good stability and symmetric strength and tone of shoulders, elbows, wrist, , knee and ankles bilaterally.  Back Exam:  Inspection:Lumbar scoliosis noted. Motion: Flexion 30 deg, Extension 15 deg, Side Bending to 20 deg bilaterally,  Rotation to 19 deg bilaterally  SLR laying: Negative  XSLR laying: Negative  Palpable tenderness: discomfort noted in the paraspinal musculature of the lumbar spine bilaterally but less than previous exam FABER: negative. Sensory change: Gross sensation intact to all lumbar and sacral dermatomes.  Reflexes: 2+ at both patellar tendons, 2+ at achilles tendons, Babinski's downgoing. Patient though does have loss of hair of his lower extremity bilaterally Strength at foot  Plantar-flexion: 5/5 Dorsi-flexion: 5/5 Eversion: 5/5 Inversion: 5/5  Leg strength  Quad: 5/5 Hamstring: 5/5 Hip flexor: 5/5 Hip abductors: 5/5      Impression and Recommendations:     This case required medical decision making of moderate complexity.

## 2015-10-12 NOTE — Patient Instructions (Signed)
Good to see you Moderate arthritis on the xray of your back.  Nothing alarming.  Stay active and continue with the exercises You are making improvement.  Ice when you need it Consider iron 65 mg 3 times a week Increase lean red meat to 2 times a week.  Stretch AFTER tennis and before dinner Have a great trip and see me next year! (6 weeks)

## 2015-10-13 DIAGNOSIS — Z85828 Personal history of other malignant neoplasm of skin: Secondary | ICD-10-CM | POA: Diagnosis not present

## 2015-10-13 DIAGNOSIS — L570 Actinic keratosis: Secondary | ICD-10-CM | POA: Diagnosis not present

## 2015-10-13 DIAGNOSIS — L821 Other seborrheic keratosis: Secondary | ICD-10-CM | POA: Diagnosis not present

## 2015-10-13 DIAGNOSIS — D1801 Hemangioma of skin and subcutaneous tissue: Secondary | ICD-10-CM | POA: Diagnosis not present

## 2015-10-13 DIAGNOSIS — L812 Freckles: Secondary | ICD-10-CM | POA: Diagnosis not present

## 2015-10-16 ENCOUNTER — Other Ambulatory Visit: Payer: Self-pay | Admitting: Gastroenterology

## 2015-10-16 DIAGNOSIS — K259 Gastric ulcer, unspecified as acute or chronic, without hemorrhage or perforation: Secondary | ICD-10-CM | POA: Diagnosis not present

## 2015-10-21 DIAGNOSIS — M79672 Pain in left foot: Secondary | ICD-10-CM | POA: Diagnosis not present

## 2015-10-21 DIAGNOSIS — M25552 Pain in left hip: Secondary | ICD-10-CM | POA: Diagnosis not present

## 2015-10-21 DIAGNOSIS — M109 Gout, unspecified: Secondary | ICD-10-CM | POA: Diagnosis not present

## 2015-12-01 DIAGNOSIS — L720 Epidermal cyst: Secondary | ICD-10-CM | POA: Diagnosis not present

## 2015-12-01 DIAGNOSIS — Z85828 Personal history of other malignant neoplasm of skin: Secondary | ICD-10-CM | POA: Diagnosis not present

## 2015-12-08 ENCOUNTER — Ambulatory Visit (INDEPENDENT_AMBULATORY_CARE_PROVIDER_SITE_OTHER): Payer: Medicare Other | Admitting: Family Medicine

## 2015-12-08 ENCOUNTER — Encounter: Payer: Self-pay | Admitting: Family Medicine

## 2015-12-08 VITALS — BP 142/82 | HR 64 | Wt 210.0 lb

## 2015-12-08 DIAGNOSIS — G8929 Other chronic pain: Secondary | ICD-10-CM | POA: Diagnosis not present

## 2015-12-08 DIAGNOSIS — M545 Low back pain, unspecified: Secondary | ICD-10-CM

## 2015-12-08 DIAGNOSIS — R269 Unspecified abnormalities of gait and mobility: Secondary | ICD-10-CM

## 2015-12-08 MED ORDER — GABAPENTIN 300 MG PO CAPS: ORAL_CAPSULE | ORAL | Status: DC

## 2015-12-08 NOTE — Patient Instructions (Signed)
Good to see you We have xrayed your back.  Ice is your friend 1/8 inch heel lift would be plenty Physical therapy is a great idea.  Stay active.  Gabapentin 300mg  at night (sent in new prescription) Have a great trip See me again in 3 weeks and if not better we may need to MRI your back and consider epidurals again.

## 2015-12-08 NOTE — Progress Notes (Signed)
[name] [address] [place], [place] [address] Phone: [phone] Subjective:    I'm seeing this patient by the request  of:  Irven Shelling, MD   CC: arthralgias and low back pain follow-up  CWC:[id number] Patrick Franco is a 82 y.o. male coming in with complaint of left hip pain. It appears the patient's pain was likely coming from his back. X-rays that show moderate osteoarthritic changes. Patient states though that since he's made changes with increasing the stretches, starting some vitamins, and watch his diet.  Patient was doing somewhat better. Patient did see another orthopedic physician though for toe pain. Was told that he had gout. Recent labs have diagnosed side. Is watching his diet at this time and not taking medicine regularly. Patient was also diagnosed with what he called a leg length discrepancy and he is been wearing a heel lift. Has noticed that he is had some trouble with some balance and coordination as well as some weakness on the left side. Seems to have to use more energy to walk in usual. States that his legs seem weak after standing for a long amount of time.  Past Medical History  Diagnosis Date  . Peptic ulcer disease   . Hypertension   . Diverticulitis   . History of colonoscopy   . Onychomycosis   . Mitral valve prolapse   . BPH (benign prostatic hyperplasia)   . Hip pain   . Sepsis ([place]) 2005    e. coli-after prostate bx  . Torn ACL   . Lumbar radiculopathy   . Sleep apnea   . Foot drop, right   . Elevated PSA   . Depression   . Gait disorder 02/03/2015  . HOH (hard of hearing)     hearing aid  . Chronic low back pain   . Foot drop, right    Past Surgical History  Procedure Laterality Date  . Right knee replacement Right 1996  . Cataract extraction Bilateral   . Tonsillectomy    . Esophagogastroduodenoscopy N/A 08/19/2015    Procedure: ESOPHAGOGASTRODUODENOSCOPY (EGD);  Surgeon: Laurence Spates, MD;   Location: [facility] ENDOSCOPY;  Service: Endoscopy;  Laterality: N/A;   Social History  Substance Use Topics  . Smoking status: Never Smoker   . Smokeless tobacco: Never Used  . Alcohol Use: Not on file   No Known Allergies Family History  Problem Relation Age of Onset  . Pneumonia Mother 47  . Cancer - Lung Father 51  . Multiple myeloma Brother 54       Past Medical History  Diagnosis Date  . Peptic ulcer disease   . Hypertension   . Diverticulitis   . History of colonoscopy   . Onychomycosis   . Mitral valve prolapse   . BPH (benign prostatic hyperplasia)   . Hip pain   . Sepsis ([place]) 2005    e. coli-after prostate bx  . Torn ACL   . Lumbar radiculopathy   . Sleep apnea   . Foot drop, right   . Elevated PSA   . Depression   . Gait disorder 02/03/2015  . HOH (hard of hearing)     hearing aid  . Chronic low back pain   . Foot drop, right    Past Surgical History  Procedure Laterality Date  . Right knee replacement Right 1996  . Cataract extraction Bilateral   . Tonsillectomy    . Esophagogastroduodenoscopy N/A 08/19/2015    Procedure: ESOPHAGOGASTRODUODENOSCOPY (EGD);  Surgeon: Laurence Spates, MD;  Location: [facility] ENDOSCOPY;  Service: Endoscopy;  Laterality: N/A;   Social History  Substance Use Topics  . Smoking status: Never Smoker   . Smokeless tobacco: Never Used  . Alcohol Use: Not on file   No Known Allergies Family History  Problem Relation Age of Onset  . Pneumonia Mother 21  . Cancer - Lung Father 75  . Multiple myeloma Brother 4    Past medical history, social, surgical and family history all reviewed in electronic medical record.   Review of Systems: No headache, visual changes, nausea, vomiting, diarrhea, constipation, dizziness, abdominal pain, skin rash, fevers, chills, night sweats, weight loss, swollen lymph nodes, body aches, joint swelling, muscle aches, chest pain, shortness of breath, mood changes.   Objective Blood pressure 142/82, pulse  64, weight 210 lb (95.255 kg), SpO2 97 %.  General: No apparent distress alert and oriented x3 mood and affect normal, dressed appropriately.  HEENT: Pupils equal, extraocular movements intact  Respiratory: Patient's speak in full sentences and does not appear short of breath  Cardiovascular: No lower extremity edema, non tender, no erythema  Skin: Warm dry intact with no signs of infection or rash on extremities or on axial skeleton.  Abdomen: Soft nontender  Neuro: Cranial nerves II through XII are intact, neurovascularly intact in all extremities with 2+ DTRs and 2+ pulses.  Lymph: No lymphadenopathy of posterior or anterior cervical chain or axillae bilaterally.  Gait antalgic with mild right foot drop patient does have weakness of the left hip gluteal region. MSK:  Non tender with full range of motion and good stability and symmetric strength and tone of shoulders, elbows, wrist, , knee and ankles bilaterally.  Back Exam:  Inspection:Lumbar scoliosis noted. Motion: Flexion 30 deg, Extension 15 deg, Side Bending to 20 deg bilaterally,  Rotation to 20 deg bilaterally no significant change SLR laying: Negative  XSLR laying: Negative  Palpable tenderness minimal in the paraspinal musculature of the lumbar spine FABER: negative. Sensory change: Gross sensation intact to all lumbar and sacral dermatomes.  Reflexes: 2+ at both patellar tendons, 2+ at achilles tendons, Babinski's downgoing. Patient though does have loss of hair of his lower extremity bilaterally Strength at foot  Plantar-flexion: 5/5 Dorsi-flexion: 5/5 Eversion: 5/5 Inversion: 5/5  Leg strength  Quad: 5/5 Hamstring: 5/5 Hip flexor: 5/5 Hip abductors: 4/5      Impression and Recommendations:     This case required medical decision making of moderate complexity.

## 2015-12-08 NOTE — Assessment & Plan Note (Addendum)
I believe the patient is having more of a spinal stenosis. X-rays do show moderate arthritic changes and an MRI may be necessary. Patient does not want to do that at this time. Patient will start with formal physical therapy for balance and coordination. We discussed not trying to change the leg length discrepancy significantly with patient being 82 years of age. We discussed icing regimen and the over-the-counter medications. Increase gabapentin to 300 mg . Discussed with patient about proper shoe wear when playing tennis. If any worsening symptoms to come back and see me again sooner otherwise I like him to follow up in 4 weeks.

## 2015-12-08 NOTE — Assessment & Plan Note (Signed)
Sent to formal physical therapy. Patient does have weakness of the hip girdle and I'm concerned that there is a nerve impingement. Possible MRI and epidurals will be needed in the future. Discussed that patient has any significant worsening weakness or bowel or bladder incontinence to seek medical attention immediately. Increased patient's gabapentin to 300 mg.

## 2016-01-14 DIAGNOSIS — G4733 Obstructive sleep apnea (adult) (pediatric): Secondary | ICD-10-CM | POA: Diagnosis not present

## 2016-01-18 DIAGNOSIS — R7301 Impaired fasting glucose: Secondary | ICD-10-CM | POA: Diagnosis not present

## 2016-01-18 DIAGNOSIS — D649 Anemia, unspecified: Secondary | ICD-10-CM | POA: Diagnosis not present

## 2016-01-18 DIAGNOSIS — Z0000 Encounter for general adult medical examination without abnormal findings: Secondary | ICD-10-CM | POA: Diagnosis not present

## 2016-01-18 DIAGNOSIS — R269 Unspecified abnormalities of gait and mobility: Secondary | ICD-10-CM | POA: Diagnosis not present

## 2016-01-18 DIAGNOSIS — Z1389 Encounter for screening for other disorder: Secondary | ICD-10-CM | POA: Diagnosis not present

## 2016-01-18 DIAGNOSIS — I10 Essential (primary) hypertension: Secondary | ICD-10-CM | POA: Diagnosis not present

## 2016-01-18 DIAGNOSIS — F329 Major depressive disorder, single episode, unspecified: Secondary | ICD-10-CM | POA: Diagnosis not present

## 2016-01-19 ENCOUNTER — Other Ambulatory Visit: Payer: Self-pay | Admitting: Gastroenterology

## 2016-01-19 DIAGNOSIS — K257 Chronic gastric ulcer without hemorrhage or perforation: Secondary | ICD-10-CM | POA: Diagnosis not present

## 2016-01-19 DIAGNOSIS — K3189 Other diseases of stomach and duodenum: Secondary | ICD-10-CM | POA: Diagnosis not present

## 2016-01-19 DIAGNOSIS — K2960 Other gastritis without bleeding: Secondary | ICD-10-CM | POA: Diagnosis not present

## 2016-01-28 DIAGNOSIS — M419 Scoliosis, unspecified: Secondary | ICD-10-CM | POA: Diagnosis not present

## 2016-01-28 DIAGNOSIS — M545 Low back pain: Secondary | ICD-10-CM | POA: Diagnosis not present

## 2016-02-02 DIAGNOSIS — M545 Low back pain: Secondary | ICD-10-CM | POA: Diagnosis not present

## 2016-02-02 DIAGNOSIS — M419 Scoliosis, unspecified: Secondary | ICD-10-CM | POA: Diagnosis not present

## 2016-02-09 ENCOUNTER — Ambulatory Visit (INDEPENDENT_AMBULATORY_CARE_PROVIDER_SITE_OTHER): Payer: Medicare Other | Admitting: Family Medicine

## 2016-02-09 ENCOUNTER — Encounter: Payer: Self-pay | Admitting: Family Medicine

## 2016-02-09 VITALS — BP 134/84 | HR 78 | Ht 69.0 in | Wt 211.0 lb

## 2016-02-09 DIAGNOSIS — M545 Low back pain, unspecified: Secondary | ICD-10-CM

## 2016-02-09 DIAGNOSIS — E669 Obesity, unspecified: Secondary | ICD-10-CM

## 2016-02-09 DIAGNOSIS — M419 Scoliosis, unspecified: Secondary | ICD-10-CM | POA: Diagnosis not present

## 2016-02-09 DIAGNOSIS — K259 Gastric ulcer, unspecified as acute or chronic, without hemorrhage or perforation: Secondary | ICD-10-CM | POA: Diagnosis not present

## 2016-02-09 DIAGNOSIS — M48062 Spinal stenosis, lumbar region with neurogenic claudication: Secondary | ICD-10-CM

## 2016-02-09 DIAGNOSIS — G8929 Other chronic pain: Secondary | ICD-10-CM | POA: Diagnosis not present

## 2016-02-09 DIAGNOSIS — M4806 Spinal stenosis, lumbar region: Secondary | ICD-10-CM

## 2016-02-09 MED ORDER — GABAPENTIN 300 MG PO CAPS: ORAL_CAPSULE | ORAL | Status: DC

## 2016-02-09 MED ORDER — GABAPENTIN 100 MG PO CAPS: 200.0000 mg | ORAL_CAPSULE | Freq: Two times a day (BID) | ORAL | Status: DC

## 2016-02-09 MED ORDER — VITAMIN D (ERGOCALCIFEROL) 1.25 MG (50000 UNIT) PO CAPS: 50000.0000 [IU] | ORAL_CAPSULE | ORAL | Status: DC

## 2016-02-09 NOTE — Progress Notes (Signed)
[name] [address] [place], [place] [address] Phone: [phone] Subjective:    I'm seeing this patient by the request  of:  Irven Shelling, MD   CC: arthralgias and low back pain follow-up  [id number] DONTAY Franco is a 82 y.o. male coming in with complaint of left hip pain. It appears the patient's pain was likely coming from his back. X-rays that show moderate osteoarthritic changes. Patient states though that since he's made changes with increasing the stretches, starting some vitamins, and watch his diet.  Patient overall seems to be doing relatively better. Since then down having back pain seems to be constant. Nothing that stopping him from activities but if he stands for long time he does have weakness in the legs. Patient states though that the numbness in the feet are significantly better with the gabapentin 300 mg.In side effects. Patient denies any new symptoms. Overall thinks he is doing better but very slowly getting better. Patient is still concerned with the weakness for standing long amount of time. Patient continues to try to play tennis fairly regularly. Does work with a physical therapist recently as well.  Past Medical History  Diagnosis Date  . Peptic ulcer disease   . Hypertension   . Diverticulitis   . History of colonoscopy   . Onychomycosis   . Mitral valve prolapse   . BPH (benign prostatic hyperplasia)   . Hip pain   . Sepsis ([place]) 2005    e. coli-after prostate bx  . Torn ACL   . Lumbar radiculopathy   . Sleep apnea   . Foot drop, right   . Elevated PSA   . Depression   . Gait disorder 02/03/2015  . HOH (hard of hearing)     hearing aid  . Chronic low back pain   . Foot drop, right    Past Surgical History  Procedure Laterality Date  . Right knee replacement Right 1996  . Cataract extraction Bilateral   . Tonsillectomy    . Esophagogastroduodenoscopy N/A 08/19/2015    Procedure:  ESOPHAGOGASTRODUODENOSCOPY (EGD);  Surgeon: Laurence Spates, MD;  Location: [facility] ENDOSCOPY;  Service: Endoscopy;  Laterality: N/A;   Social History  Substance Use Topics  . Smoking status: Never Smoker   . Smokeless tobacco: Never Used  . Alcohol Use: None   No Known Allergies Family History  Problem Relation Age of Onset  . Pneumonia Mother 39  . Cancer - Lung Father 5  . Multiple myeloma Brother 32       Past Medical History  Diagnosis Date  . Peptic ulcer disease   . Hypertension   . Diverticulitis   . History of colonoscopy   . Onychomycosis   . Mitral valve prolapse   . BPH (benign prostatic hyperplasia)   . Hip pain   . Sepsis ([place]) 2005    e. coli-after prostate bx  . Torn ACL   . Lumbar radiculopathy   . Sleep apnea   . Foot drop, right   . Elevated PSA   . Depression   . Gait disorder 02/03/2015  . HOH (hard of hearing)     hearing aid  . Chronic low back pain   . Foot drop, right    Past Surgical History  Procedure Laterality Date  . Right knee replacement Right 1996  . Cataract extraction Bilateral   . Tonsillectomy    . Esophagogastroduodenoscopy N/A 08/19/2015    Procedure: ESOPHAGOGASTRODUODENOSCOPY (EGD);  Surgeon: Jeneen Rinks  Oletta Lamas, MD;  Location: [facility] ENDOSCOPY;  Service: Endoscopy;  Laterality: N/A;   Social History  Substance Use Topics  . Smoking status: Never Smoker   . Smokeless tobacco: Never Used  . Alcohol Use: None   No Known Allergies Family History  Problem Relation Age of Onset  . Pneumonia Mother 34  . Cancer - Lung Father 69  . Multiple myeloma Brother 71    Past medical history, social, surgical and family history all reviewed in electronic medical record.   Review of Systems: No headache, visual changes, nausea, vomiting, diarrhea, constipation, dizziness, abdominal pain, skin rash, fevers, chills, night sweats, weight loss, swollen lymph nodes, body aches, joint swelling, muscle aches, chest pain, shortness of breath, mood  changes.   Objective Blood pressure 134/84, pulse 78, height 5' 9"  (1.753 m), weight 211 lb (95.709 kg), SpO2 97 %.  General: No apparent distress alert and oriented x3 mood and affect normal, dressed appropriately.  HEENT: Pupils equal, extraocular movements intact  Respiratory: Patient's speak in full sentences and does not appear short of breath  Cardiovascular: No lower extremity edema, non tender, no erythema  Skin: Warm dry intact with no signs of infection or rash on extremities or on axial skeleton.  Abdomen: Soft nontender  Neuro: Cranial nerves II through XII are intact, neurovascularly intact in all extremities with 2+ DTRs and 2+ pulses.  Lymph: No lymphadenopathy of posterior or anterior cervical chain or axillae bilaterally.  Gait antalgic with mild right foot drop patient does have weakness of the left hip gluteal region. MSK:  Non tender with full range of motion and good stability and symmetric strength and tone of shoulders, elbows, wrist, , knee and ankles bilaterally.  Back Exam:  Inspection:Lumbar scoliosis noted. Motion: Flexion 30 deg, Extension 15 deg, Side Bending to 20 deg bilaterally,  Rotation to 20 deg bilaterally no significant change no significant improvement. SLR laying: Negative  XSLR laying: Negative  Mild increase in paraspinal musculature tenderness from previous exam FABER: negative. Sensory change: Gross sensation intact to all lumbar and sacral dermatomes.  Reflexes: 2+ at both patellar tendons, 2+ at achilles tendons, Babinski's downgoing. Patient though does have loss of hair of his lower extremity bilaterally Strength at foot  Plantar-flexion: 5/5 Dorsi-flexion: 5/5 Eversion: 5/5 Inversion: 5/5  Leg strength  Quad: 5/5 Hamstring: 5/5 Hip flexor: 5/5 Hip abductors: 4/5  Minimal change from previous exam    Impression and Recommendations:     This case required medical decision making of moderate complexity.

## 2016-02-09 NOTE — Assessment & Plan Note (Signed)
Patient does have degenerative disc disease. Patient also has a leg length discrepancy. We discussed with patient at great length. Patient is going to wear the heel lift on a more regular basis. Patient is going to continue to work on his back exercises with a physical therapist. Increase his gabapentin to 300 mg at night and 100 mg in a.m. and p.m. Warned the potential side effects. We discussed the possibility of repeat MRI the patient would not when I do epidurals again because he did not have relief in the past. There is multiple years ago. Discussed with patient to continue vitamin supplementations and patient given and once weekly vitamin D dose. Patient come back and see me again in 4-6 weeks to make sure he is responding appropriately.

## 2016-02-09 NOTE — Assessment & Plan Note (Signed)
Body mass index is 31.15 kg/(m^2). Encourage weight loss

## 2016-02-09 NOTE — Progress Notes (Signed)
Pre visit review using our clinic review tool, if applicable. No additional management support is needed unless otherwise documented below in the visit note. 

## 2016-02-09 NOTE — Patient Instructions (Addendum)
Good to see you  You are doing better We will do some tweaks Gabapentin 100mg  in AM, 100mg  in PM and 300mg  at night Vitamin D 5000 IU weekly for 12 weeks Keep working with the therapist I think you will do well Message after tennis instead See me again in 2 months

## 2016-02-11 DIAGNOSIS — M419 Scoliosis, unspecified: Secondary | ICD-10-CM | POA: Diagnosis not present

## 2016-02-11 DIAGNOSIS — M545 Low back pain: Secondary | ICD-10-CM | POA: Diagnosis not present

## 2016-02-16 DIAGNOSIS — M419 Scoliosis, unspecified: Secondary | ICD-10-CM | POA: Diagnosis not present

## 2016-02-16 DIAGNOSIS — M545 Low back pain: Secondary | ICD-10-CM | POA: Diagnosis not present

## 2016-02-18 DIAGNOSIS — M419 Scoliosis, unspecified: Secondary | ICD-10-CM | POA: Diagnosis not present

## 2016-02-18 DIAGNOSIS — M545 Low back pain: Secondary | ICD-10-CM | POA: Diagnosis not present

## 2016-02-23 DIAGNOSIS — M545 Low back pain: Secondary | ICD-10-CM | POA: Diagnosis not present

## 2016-02-23 DIAGNOSIS — M419 Scoliosis, unspecified: Secondary | ICD-10-CM | POA: Diagnosis not present

## 2016-03-01 DIAGNOSIS — M419 Scoliosis, unspecified: Secondary | ICD-10-CM | POA: Diagnosis not present

## 2016-03-01 DIAGNOSIS — M545 Low back pain: Secondary | ICD-10-CM | POA: Diagnosis not present

## 2016-03-08 DIAGNOSIS — M419 Scoliosis, unspecified: Secondary | ICD-10-CM | POA: Diagnosis not present

## 2016-03-08 DIAGNOSIS — M545 Low back pain: Secondary | ICD-10-CM | POA: Diagnosis not present

## 2016-03-14 DIAGNOSIS — N402 Nodular prostate without lower urinary tract symptoms: Secondary | ICD-10-CM | POA: Diagnosis not present

## 2016-03-21 DIAGNOSIS — R350 Frequency of micturition: Secondary | ICD-10-CM | POA: Diagnosis not present

## 2016-03-21 DIAGNOSIS — N138 Other obstructive and reflux uropathy: Secondary | ICD-10-CM | POA: Diagnosis not present

## 2016-03-21 DIAGNOSIS — Z0000 Encounter for general adult medical examination without abnormal findings: Secondary | ICD-10-CM | POA: Diagnosis not present

## 2016-03-21 DIAGNOSIS — N401 Enlarged prostate with lower urinary tract symptoms: Secondary | ICD-10-CM | POA: Diagnosis not present

## 2016-03-22 DIAGNOSIS — M419 Scoliosis, unspecified: Secondary | ICD-10-CM | POA: Diagnosis not present

## 2016-03-22 DIAGNOSIS — M545 Low back pain: Secondary | ICD-10-CM | POA: Diagnosis not present

## 2016-04-12 ENCOUNTER — Encounter: Payer: Self-pay | Admitting: Family Medicine

## 2016-04-12 ENCOUNTER — Ambulatory Visit (INDEPENDENT_AMBULATORY_CARE_PROVIDER_SITE_OTHER): Payer: Medicare Other | Admitting: Family Medicine

## 2016-04-12 VITALS — BP 124/84 | HR 67 | Ht 69.0 in | Wt 211.0 lb

## 2016-04-12 DIAGNOSIS — M48062 Spinal stenosis, lumbar region with neurogenic claudication: Secondary | ICD-10-CM

## 2016-04-12 DIAGNOSIS — M1A0721 Idiopathic chronic gout, left ankle and foot, with tophus (tophi): Secondary | ICD-10-CM | POA: Diagnosis not present

## 2016-04-12 DIAGNOSIS — M4806 Spinal stenosis, lumbar region: Secondary | ICD-10-CM

## 2016-04-12 DIAGNOSIS — M109 Gout, unspecified: Secondary | ICD-10-CM | POA: Insufficient documentation

## 2016-04-12 MED ORDER — COLCHICINE 0.6 MG PO TABS: 0.6000 mg | ORAL_TABLET | Freq: Every day | ORAL | Status: DC

## 2016-04-12 NOTE — Assessment & Plan Note (Signed)
Patient's foot does have some what appears to be more of a gout related intact. Patient has no history or injury of this foot. Did have increase including alcohol. We discussed icing regimen. Given prescription for colchicine. We will lower the dose secondary to him being on a calcium channel blocker. Follow up within 2 weeks if not completely resolved.

## 2016-04-12 NOTE — Progress Notes (Signed)
[name] [address] [place], [place] [address] Phone: [phone] Subjective:    I'm seeing this patient by the request  of:  Irven Shelling, MD   CC: arthralgias and low back pain follow-up  ION:GEXBMWUXLK JRU PENSE is a 82 y.o. male coming in with complaint of left hip pain. It appears the patient's pain was likely coming from his back. X-rays that show moderate osteoarthritic changes. On gabapentin 100/100/347m. Doing well no significant radiation, Not doing the icing. Patient states this seems to be doing well and doing yoga and pilatess on a regular basis.  Strength at foot pain. Started on Saturday night. Did not have any injury. Since then has had severe pain with walking and does have some pain at rest. States that it is red and swollen. Has a history of 1 gout exacerbation but it was in his toe about a decade ago. When discussing his diet did have significant seafood and alcohol recently.   Past Medical History  Diagnosis Date  . Peptic ulcer disease   . Hypertension   . Diverticulitis   . History of colonoscopy   . Onychomycosis   . Mitral valve prolapse   . BPH (benign prostatic hyperplasia)   . Hip pain   [place]    e. coli-after prostate bx  . Torn ACL   . Lumbar radiculopathy   . Sleep apnea   . Foot drop, right   . Elevated PSA   . Depression   . Gait disorder 02/03/2015  . HOH (hard of hearing)     hearing aid  . Chronic low back pain   . Foot drop, right    Past Surgical History  Procedure Laterality Date  . Right knee replacement Right 1996  . Cataract extraction Bilateral   . Tonsillectomy    . Esophagogastroduodenoscopy N/A 08/19/2015    Procedure: ESOPHAGOGASTRODUODENOSCOPY (EGD);  Surgeon: JLaurence Spates MD;  [facility];  Service: Endoscopy;  Laterality: N/A;   Social History  Substance Use Topics  . Smoking status: Never Smoker   . Smokeless tobacco: Never Used  . Alcohol Use:  None   No Known Allergies Family History  Problem Relation Age of Onset  . Pneumonia Mother 834 . Cancer - Lung Father 511 . Multiple myeloma Brother 543      Past Medical History  Diagnosis Date  . Peptic ulcer disease   . Hypertension   . Diverticulitis   . History of colonoscopy   . Onychomycosis   . Mitral valve prolapse   . BPH (benign prostatic hyperplasia)   . Hip pain   . [place]    e. coli-after prostate bx  . Torn ACL   . Lumbar radiculopathy   . Sleep apnea   . Foot drop, right   . Elevated PSA   . Depression   . Gait disorder 02/03/2015  . HOH (hard of hearing)     hearing aid  . Chronic low back pain   . Foot drop, right    Past Surgical History  Procedure Laterality Date  . Right knee replacement Right 1996  . Cataract extraction Bilateral   . Tonsillectomy    . Esophagogastroduodenoscopy N/A 08/19/2015    Procedure: ESOPHAGOGASTRODUODENOSCOPY (EGD);  Surgeon: JLaurence Spates MD;  [facility];  Service: Endoscopy;  Laterality: N/A;   Social History  Substance Use Topics  . Smoking status: Never Smoker   . Smokeless  tobacco: Never Used  . Alcohol Use: None   No Known Allergies Family History  Problem Relation Age of Onset  . Pneumonia Mother 68  . Cancer - Lung Father 56  . Multiple myeloma Brother 45    Past medical history, social, surgical and family history all reviewed in electronic medical record.   Review of Systems: No headache, visual changes, nausea, vomiting, diarrhea, constipation, dizziness, abdominal pain, skin rash, fevers, chills, night sweats, weight loss, swollen lymph nodes, body aches, joint swelling, muscle aches, chest pain, shortness of breath, mood changes.   Objective Blood pressure 124/84, pulse 67, height _0  (1.753 m), weight 211 lb (95.709 kg), SpO2 97 %.  General: No apparent distress alert and oriented x3 mood and affect normal, dressed appropriately.  HEENT: Pupils equal, extraocular  movements intact  Respiratory: Patient's speak in full sentences and does not appear short of breath  Cardiovascular: No lower extremity edema, non tender, no erythema  Skin: Warm dry intact with no signs of infection or rash on extremities or on axial skeleton.  Abdomen: Soft nontender  Neuro: Cranial nerves II through XII are intact, neurovascularly intact in all extremities with 2+ DTRs and 2+ pulses.  Lymph: No lymphadenopathy of posterior or anterior cervical chain or axillae bilaterally.  Gait antalgic with mild right foot drop patient does have weakness of the left hip gluteal region. MSK:  Non tender with full range of motion and good stability and symmetric strength and tone of shoulders, elbows, wrist, , knee and ankles bilaterally. Arthritic changes of multiple joints Foot exam shows the patient does have some erythema on the dorsal aspect and the lateral aspect of the foot left sided. Patient does have severe tenderness to even light palpation in this area. No signs of skin breakdown. Neurovascularly intact with some mild numbness of the first toes but bilaterally. Does have breakdown of the transverse arch.  Back Exam:  Inspection:Lumbar scoliosis noted. Motion: Flexion 30 deg, Extension 15 deg, Side Bending to 20 deg bilaterally,  Rotation to 20 deg bilaterally no significant change no significant improvement. SLR laying: Negative  XSLR laying: Negative  Mild increase in paraspinal musculature tenderness from previous exam FABER: negative. Sensory change: Gross sensation intact to all lumbar and sacral dermatomes.  Reflexes: 2+ at both patellar tendons, 2+ at achilles tendons, Babinski's downgoing. Patient though does have loss of hair of his lower extremity bilaterally Strength at foot  Plantar-flexion: 5/5 Dorsi-flexion: 5/5 Eversion: 5/5 Inversion: 5/5  Leg strength  Quad: 5/5 Hamstring: 5/5 Hip flexor: 5/5 Hip abductors: 4/5  Minimal change from previous exam      Impression and Recommendations:     This case required medical decision making of moderate complexity.

## 2016-04-12 NOTE — Patient Instructions (Signed)
Good to see you  For the foot it looks like gout.  Colchicine 1-2 times daily for next 5 days.  Gave you more in case you need it but try only for 5 days.  Tart cherry extract any dose at night can help keep uyric acid out of your system See me again in 2-3 months to follow up on your back or see me sooner if the gout or foot pain is not gone in next 2-3 weeks.

## 2016-04-12 NOTE — Assessment & Plan Note (Signed)
Patient seems to be stable with the medications he is on this time. We will continue them at the same dose. We discussed continuing to be active. Patient is working out 6 days a week. Attempting core activity. Patient not doing home exercises as regularly but with how active he is at the cubital do well. Patient will follow-up with me again in 2-3 months for further evaluation and treatment.

## 2016-04-12 NOTE — Progress Notes (Signed)
Pre visit review using our clinic review tool, if applicable. No additional management support is needed unless otherwise documented below in the visit note. 

## 2016-04-14 DIAGNOSIS — L570 Actinic keratosis: Secondary | ICD-10-CM | POA: Diagnosis not present

## 2016-04-14 DIAGNOSIS — L821 Other seborrheic keratosis: Secondary | ICD-10-CM | POA: Diagnosis not present

## 2016-04-14 DIAGNOSIS — L720 Epidermal cyst: Secondary | ICD-10-CM | POA: Diagnosis not present

## 2016-04-14 DIAGNOSIS — Z85828 Personal history of other malignant neoplasm of skin: Secondary | ICD-10-CM | POA: Diagnosis not present

## 2016-04-14 DIAGNOSIS — L812 Freckles: Secondary | ICD-10-CM | POA: Diagnosis not present

## 2016-04-14 DIAGNOSIS — D1801 Hemangioma of skin and subcutaneous tissue: Secondary | ICD-10-CM | POA: Diagnosis not present

## 2016-04-19 ENCOUNTER — Other Ambulatory Visit: Payer: Self-pay | Admitting: Gastroenterology

## 2016-04-19 DIAGNOSIS — K319 Disease of stomach and duodenum, unspecified: Secondary | ICD-10-CM | POA: Diagnosis not present

## 2016-04-19 DIAGNOSIS — K257 Chronic gastric ulcer without hemorrhage or perforation: Secondary | ICD-10-CM | POA: Diagnosis not present

## 2016-04-19 DIAGNOSIS — K3189 Other diseases of stomach and duodenum: Secondary | ICD-10-CM | POA: Diagnosis not present

## 2016-04-19 DIAGNOSIS — D490 Neoplasm of unspecified behavior of digestive system: Secondary | ICD-10-CM | POA: Diagnosis not present

## 2016-04-20 ENCOUNTER — Other Ambulatory Visit: Payer: Self-pay | Admitting: Gastroenterology

## 2016-04-20 DIAGNOSIS — K3189 Other diseases of stomach and duodenum: Secondary | ICD-10-CM

## 2016-04-25 ENCOUNTER — Other Ambulatory Visit: Payer: Medicare Other

## 2016-04-27 ENCOUNTER — Ambulatory Visit
Admission: RE | Admit: 2016-04-27 | Discharge: 2016-04-27 | Disposition: A | Discharge status: Home / Self Care | Payer: Medicare Other | Source: Ambulatory Visit | Attending: Gastroenterology | Admitting: Gastroenterology

## 2016-04-27 DIAGNOSIS — K3189 Other diseases of stomach and duodenum: Secondary | ICD-10-CM

## 2016-04-27 DIAGNOSIS — K429 Umbilical hernia without obstruction or gangrene: Secondary | ICD-10-CM | POA: Diagnosis not present

## 2016-04-27 MED ORDER — IOPAMIDOL (ISOVUE-300) INJECTION 61%
125.0000 mL | Freq: Once | INTRAVENOUS | Status: AC | PRN
  Administered 2016-04-27: 125 mL via INTRAVENOUS

## 2016-05-12 DIAGNOSIS — K760 Fatty (change of) liver, not elsewhere classified: Secondary | ICD-10-CM | POA: Diagnosis not present

## 2016-05-12 DIAGNOSIS — K259 Gastric ulcer, unspecified as acute or chronic, without hemorrhage or perforation: Secondary | ICD-10-CM | POA: Diagnosis not present

## 2016-05-31 ENCOUNTER — Other Ambulatory Visit: Payer: Self-pay | Admitting: Family Medicine

## 2016-06-01 ENCOUNTER — Other Ambulatory Visit: Payer: Self-pay

## 2016-06-01 DIAGNOSIS — M48062 Spinal stenosis, lumbar region with neurogenic claudication: Secondary | ICD-10-CM

## 2016-06-01 MED ORDER — GABAPENTIN 100 MG PO CAPS: 100.0000 mg | ORAL_CAPSULE | Freq: Two times a day (BID) | ORAL | Status: DC

## 2016-06-02 ENCOUNTER — Ambulatory Visit (INDEPENDENT_AMBULATORY_CARE_PROVIDER_SITE_OTHER): Payer: Medicare Other | Admitting: Family Medicine

## 2016-06-02 ENCOUNTER — Encounter: Payer: Self-pay | Admitting: Family Medicine

## 2016-06-02 VITALS — BP 136/82 | HR 72 | Wt 211.0 lb

## 2016-06-02 DIAGNOSIS — M48062 Spinal stenosis, lumbar region with neurogenic claudication: Secondary | ICD-10-CM

## 2016-06-02 DIAGNOSIS — M4806 Spinal stenosis, lumbar region: Secondary | ICD-10-CM

## 2016-06-02 DIAGNOSIS — M1A0721 Idiopathic chronic gout, left ankle and foot, with tophus (tophi): Secondary | ICD-10-CM | POA: Diagnosis not present

## 2016-06-02 DIAGNOSIS — M25562 Pain in left knee: Secondary | ICD-10-CM | POA: Insufficient documentation

## 2016-06-02 MED ORDER — ACETAZOLAMIDE 125 MG PO TABS: 125.0000 mg | ORAL_TABLET | ORAL | Status: DC | PRN

## 2016-06-02 MED ORDER — GABAPENTIN 300 MG PO CAPS: ORAL_CAPSULE | ORAL | Status: DC

## 2016-06-02 MED ORDER — ALLOPURINOL 100 MG PO TABS: 100.0000 mg | ORAL_TABLET | Freq: Every day | ORAL | Status: DC

## 2016-06-02 NOTE — Assessment & Plan Note (Signed)
Has swelling as well as degenerative changes. We discussed possible injection which patient declined. Discussed bracing which patient also declined. Patient will try an over-the-counter compression sleeve. Hopefully the allopurinol be beneficial. Follow-up again in 2 months

## 2016-06-02 NOTE — Assessment & Plan Note (Signed)
I do believe that this is causing some of his aches and pains. We will start him on allopurinol 100 mg at low dose. Patient's kidney functions fine. We discussed bridging with colchicine daily for 3 days. Patient's will follow-up with me again in 2 months. Possibly increasing to 300 mg in the long run.

## 2016-06-02 NOTE — Assessment & Plan Note (Signed)
Discussed again with patient at great length. Concerned and he is having worsening radicular symptoms. Patient does not want to significantly change his gabapentin. We discussed trying 400 mg at night. Patient will try this. Patient was to avoid any type of injections. Patient come back again in 2 months if worsening symptoms encourage possible advance imaging as well as injections. Patient does have severe worsening or any bowel or bladder incontinence to seek medical attention immediately.

## 2016-06-02 NOTE — Progress Notes (Signed)
[name] [address] [place], [place] [address] Phone: [phone] Subjective:    I'm seeing this patient by the request  of:  Irven Shelling, MD   CC: arthralgias and low back pain follow-up  [name] is a 82 y.o. male coming in with complaint of left hip pain. It appears the patient's pain was likely coming from his back. X-rays that show moderate osteoarthritic changes. On gabapentin 100/100/390m. No side effects. Having some mild worsening pain. Has not been watching his diet as much. Patient is wondering what he was going to do with him [place] the next 2 [place] Patient doesn't know if any exacerbating factors what else to kind do.  Patient does have knee pain. Seems to be more on the left side. Patient states there is some mild instability. Has a history of gout but does not feel like is having a flare. Does feel like it is swollen though. Some mild instability. Has taken the colchicine and does respond to that. Does not take it on a regular basis.   Past Medical History  Diagnosis Date  . Peptic ulcer disease   . Hypertension   . Diverticulitis   . History of colonoscopy   . Onychomycosis   . Mitral valve prolapse   . BPH (benign prostatic hyperplasia)   . Hip pain   . [place]epsis ([place]    e. coli-after prostate bx  . Torn ACL   . Lumbar radiculopathy   . Sleep apnea   . Foot drop, right   . Elevated PSA   . Depression   . Gait disorder 02/03/2015  . HOH (hard of hearing)     hearing aid  . Chronic low back pain   . Foot drop, right    Past Surgical History  Procedure Laterality Date  . Right knee replacement Right 1996  . Cataract extraction Bilateral   . Tonsillectomy    . Esophagogastroduodenoscopy N/A 08/19/2015    Procedure: ESOPHAGOGASTRODUODENOSCOPY (EGD);  Surgeon: JLaurence Spates MD;  [facility];  Service: Endoscopy;  Laterality: N/A;   Social History  Substance  Use Topics  . Smoking status: Never Smoker   . Smokeless tobacco: Never Used  . Alcohol Use: Not on file   No Known Allergies Family History  Problem Relation Age of Onset  . Pneumonia Mother 866 . Cancer - Lung Father 552 . Multiple myeloma Brother 520      Past Medical History  Diagnosis Date  . Peptic ulcer disease   . Hypertension   . Diverticulitis   . History of colonoscopy   . Onychomycosis   . Mitral valve prolapse   . BPH (benign prostatic hyperplasia)   . Hip pain   . [place]is ([facility]    e. coli-after prostate bx  . Torn ACL   . Lumbar radiculopathy   . Sleep apnea   . Foot drop, right   . Elevated PSA   . Depression   . Gait disorder 02/03/2015  . HOH (hard of hearing)     hearing aid  . Chronic low back pain   . Foot drop, right    Past Surgical History  Procedure Laterality Date  . Right knee replacement Right 1996  . Cataract extraction Bilateral   . Tonsillectomy    . Esophagogastroduodenoscopy N/A 08/19/2015    Procedure: ESOPHAGOGASTRODUODENOSCOPY (EGD);  Surgeon: JLaurence Spates MD;  [facility]  Service: Endoscopy;  Laterality: N/A;   Social History  Substance Use Topics  . Smoking status: Never Smoker   . Smokeless tobacco: Never Used  . Alcohol Use: Not on file   No Known Allergies Family History  Problem Relation Age of Onset  . Pneumonia Mother 19  . Cancer - Lung Father 61  . Multiple myeloma Brother 26    Past medical history, social, surgical and family history all reviewed in electronic medical record.   Review of Systems: No headache, visual changes, nausea, vomiting, diarrhea, constipation, dizziness, abdominal pain, skin rash, fevers, chills, night sweats, weight loss, swollen lymph nodes, body aches, joint swelling, muscle aches, chest pain, shortness of breath, mood changes.   Objective There were no vitals taken for this visit.  General: No apparent distress alert and oriented x3 mood and affect normal,  dressed appropriately.  HEENT: Pupils equal, extraocular movements intact  Respiratory: Patient's speak in full sentences and does not appear short of breath  Cardiovascular: No lower extremity edema, non tender, no erythema  Skin: Warm dry intact with no signs of infection or rash on extremities or on axial skeleton.  Abdomen: Soft nontender  Neuro: Cranial nerves II through XII are intact, neurovascularly intact in all extremities with 2+ DTRs and 2+ pulses.  Lymph: No lymphadenopathy of posterior or anterior cervical chain or axillae bilaterally.  Gait antalgic with mild right foot drop patient does have weakness of the left hip gluteal region.mild instability of the left knee MSK:  Non tender with full range of motion and good stability and symmetric strength and tone of shoulders, elbows, wrist, , and ankles bilaterally. Arthritic changes of multiple joints Foot exam shows the patient does have some erythema on the dorsal aspect and the lateral aspect of the foot left sided. Patient does have severe tenderness to even light palpation in this area. No signs of skin breakdown. Neurovascularly intact with some mild numbness of the first toes but bilaterally. Does have breakdown of the transverse arch.  Left knee does show degenerative changes. Patient has mild valgus deformity with as well as instability with valgus force. Mild tenderness to palpation over the medial joint line. Patient does have +1 effusion. Lacks last 5 of flexion.  Back Exam:  Inspection:Lumbar scoliosis noted. Motion: Flexion 30 deg, Extension 15 deg, Side Bending to 20 deg bilaterally,  Rotation to 20 deg bilaterally no significant change no significant improvement. SLR laying: Negative patient does have significant tightness of the hamstrings XSLR laying: Negative  Continue increasing tenderness in the paraspinal musculature FABER: negative. Sensory change: Gross sensation intact to all lumbar and sacral dermatomes.    Reflexes: 2+ at both patellar tendons, 2+ at achilles tendons, Babinski's downgoing. Patient though does have loss of hair of his lower extremity bilaterally Strength at foot  Plantar-flexion: 5/5 Dorsi-flexion: 5/5 Eversion: 5/5 Inversion: 5/5  Leg strength  Quad: 5/5 Hamstring: 5/5 Hip flexor: 5/5 Hip abductors: 4/5  Minimal change from previous exam    Impression and Recommendations:     This case required medical decision making of moderate complexity.

## 2016-06-02 NOTE — Patient Instructions (Signed)
Good to see you  Try to increase nighttime dose of gabapentin to 400mg  .  If too groggy go back to 300 Allopurinol is new medicine and should help the gout.  Take with colchicine for first 3 days.  Could help all your pains Diamox refilled and take 1-2 times daily starting 2 days before getting to [place] and then for 5-7 days while there.  Tart cherry extract at night can help all the pains See me again when you get back in October Have a great trip

## 2016-06-16 DIAGNOSIS — D229 Melanocytic nevi, unspecified: Secondary | ICD-10-CM | POA: Diagnosis not present

## 2016-06-16 DIAGNOSIS — D485 Neoplasm of uncertain behavior of skin: Secondary | ICD-10-CM | POA: Diagnosis not present

## 2016-06-16 DIAGNOSIS — R0602 Shortness of breath: Secondary | ICD-10-CM | POA: Diagnosis not present

## 2016-06-16 DIAGNOSIS — R0902 Hypoxemia: Secondary | ICD-10-CM | POA: Diagnosis not present

## 2016-06-16 DIAGNOSIS — C44622 Squamous cell carcinoma of skin of right upper limb, including shoulder: Secondary | ICD-10-CM | POA: Diagnosis not present

## 2016-07-27 DIAGNOSIS — C44622 Squamous cell carcinoma of skin of right upper limb, including shoulder: Secondary | ICD-10-CM | POA: Diagnosis not present

## 2016-08-08 DIAGNOSIS — L570 Actinic keratosis: Secondary | ICD-10-CM | POA: Diagnosis not present

## 2016-08-08 DIAGNOSIS — L309 Dermatitis, unspecified: Secondary | ICD-10-CM | POA: Diagnosis not present

## 2016-08-08 DIAGNOSIS — G479 Sleep disorder, unspecified: Secondary | ICD-10-CM | POA: Diagnosis not present

## 2016-08-08 DIAGNOSIS — Z8589 Personal history of malignant neoplasm of other organs and systems: Secondary | ICD-10-CM | POA: Diagnosis not present

## 2016-08-08 DIAGNOSIS — Z4802 Encounter for removal of sutures: Secondary | ICD-10-CM | POA: Diagnosis not present

## 2016-08-30 ENCOUNTER — Other Ambulatory Visit: Payer: Self-pay | Admitting: Internal Medicine

## 2016-08-30 DIAGNOSIS — I341 Nonrheumatic mitral (valve) prolapse: Secondary | ICD-10-CM | POA: Diagnosis not present

## 2016-08-30 DIAGNOSIS — Z23 Encounter for immunization: Secondary | ICD-10-CM | POA: Diagnosis not present

## 2016-08-30 DIAGNOSIS — I10 Essential (primary) hypertension: Secondary | ICD-10-CM | POA: Diagnosis not present

## 2016-08-30 DIAGNOSIS — K3189 Other diseases of stomach and duodenum: Secondary | ICD-10-CM | POA: Diagnosis not present

## 2016-09-15 ENCOUNTER — Ambulatory Visit (HOSPITAL_COMMUNITY): Payer: Medicare Other | Attending: Cardiology

## 2016-09-15 ENCOUNTER — Other Ambulatory Visit: Payer: Self-pay

## 2016-09-15 DIAGNOSIS — H353111 Nonexudative age-related macular degeneration, right eye, early dry stage: Secondary | ICD-10-CM | POA: Diagnosis not present

## 2016-09-15 DIAGNOSIS — H524 Presbyopia: Secondary | ICD-10-CM | POA: Diagnosis not present

## 2016-09-15 DIAGNOSIS — H353121 Nonexudative age-related macular degeneration, left eye, early dry stage: Secondary | ICD-10-CM | POA: Diagnosis not present

## 2016-09-15 DIAGNOSIS — Z961 Presence of intraocular lens: Secondary | ICD-10-CM | POA: Diagnosis not present

## 2016-09-15 DIAGNOSIS — H5203 Hypermetropia, bilateral: Secondary | ICD-10-CM | POA: Diagnosis not present

## 2016-09-15 DIAGNOSIS — H52223 Regular astigmatism, bilateral: Secondary | ICD-10-CM | POA: Diagnosis not present

## 2016-09-15 DIAGNOSIS — I341 Nonrheumatic mitral (valve) prolapse: Secondary | ICD-10-CM | POA: Diagnosis not present

## 2016-09-15 NOTE — Progress Notes (Signed)
[name] [address] [place], [place] [address] Phone: [phone] Subjective:    I'm seeing this patient by the request  of:  Irven Shelling, MD   CC: arthralgias and low back pain follow-up  [id number]  Patrick Franco is a 82 y.o. male coming in with complaint of left hip pain. It appears the patient's pain was likely coming from his back and likely underlying spinal stenosis. X-rays that show moderate osteoarthritic changes. On gabapentin 100/100/3103m. Patient was out of state for the last several months..States that it seems to be doing relatively well. Continues to have fatigue in the back, left hip as well as left leg. Has not been following though. Feels like he is overall about stable. She needs to play tennis fairly regularly.   Patient does have knee pain. Seems to be more on the left side. Patient states there is some mild instability. Has a history of gout but does not feel like is having a flare. Has underlying arthritis that also is contributing Patient states continues to have some pain. States that if he wears the brace he seems to do better. Wears it with tenderness only. Patient states that it is more of a dull throbbing aching pain on the medial aspect of the knee. Sometimes feels like it's giving away but does not do it on a regular basis.   Past Medical History:  Diagnosis Date  . BPH (benign prostatic hyperplasia)   . Chronic low back pain   . Depression   . Diverticulitis   . Elevated PSA   . Foot drop, right   . Foot drop, right   . Gait disorder 02/03/2015  . Hip pain   . History of colonoscopy   . HOH (hard of hearing)    hearing aid  . Hypertension   . Lumbar radiculopathy   . Mitral valve prolapse   . Onychomycosis   . Peptic ulcer disease   . [place]is ([place]   e. coli-after prostate bx  . Sleep apnea   . Torn ACL    Past Surgical History:  Procedure Laterality Date  . CATARACT EXTRACTION Bilateral   .  ESOPHAGOGASTRODUODENOSCOPY N/A 08/19/2015   Procedure: ESOPHAGOGASTRODUODENOSCOPY (EGD);  Surgeon: JLaurence Spates MD;  [facility];  Service: Endoscopy;  Laterality: N/A;  . right knee replacement Right 1996  . TONSILLECTOMY     Social History  Substance Use Topics  . Smoking status: Never Smoker  . Smokeless tobacco: Never Used  . Alcohol use Not on file   No Known Allergies Family History  Problem Relation Age of Onset  . Pneumonia Mother 827 . Cancer - Lung Father 573 . Multiple myeloma Brother 557      Past Medical History:  Diagnosis Date  . BPH (benign prostatic hyperplasia)   . Chronic low back pain   . Depression   . Diverticulitis   . Elevated PSA   . Foot drop, right   . Foot drop, right   . Gait disorder 02/03/2015  . Hip pain   . History of colonoscopy   . HOH (hard of hearing)    hearing aid  . Hypertension   . Lumbar radiculopathy   . Mitral valve prolapse   . Onychomycosis   . Peptic ulcer disease   . [place]is ([facility]   e. coli-after prostate bx  . Sleep apnea   . Torn ACL    Past Surgical History:  Procedure  Laterality Date  . CATARACT EXTRACTION Bilateral   . ESOPHAGOGASTRODUODENOSCOPY N/A 08/19/2015   Procedure: ESOPHAGOGASTRODUODENOSCOPY (EGD);  Surgeon: Laurence Spates, MD;  [facility];  Service: Endoscopy;  Laterality: N/A;  . right knee replacement Right 1996  . TONSILLECTOMY     Social History  Substance Use Topics  . Smoking status: Never Smoker  . Smokeless tobacco: Never Used  . Alcohol use Not on file   No Known Allergies Family History  Problem Relation Age of Onset  . Pneumonia Mother 48  . Cancer - Lung Father 51  . Multiple myeloma Brother 41    Past medical history, social, surgical and family history all reviewed in electronic medical record.   Review of Systems: No headache, visual changes, nausea, vomiting, diarrhea, constipation, dizziness, abdominal pain, skin rash, fevers, chills, night  sweats, weight loss, swollen lymph nodes, body aches, joint swelling, muscle aches, chest pain, shortness of breath, mood changes.   Objective  Blood pressure 116/78, pulse 68, height 5' 9"  (1.753 m), weight 209 lb (94.8 kg), SpO2 95 %.  General: No apparent distress alert and oriented x3 mood and affect normal, dressed appropriately.  HEENT: Pupils equal, extraocular movements intact  Respiratory: Patient's speak in full sentences and does not appear short of breath  Cardiovascular: No lower extremity edema, non tender, no erythema  Skin: Warm dry intact with no signs of infection or rash on extremities or on axial skeleton.  Abdomen: Soft nontender  Neuro: Cranial nerves II through XII are intact, neurovascularly intact in all extremities with 2+ DTRs and 2+ pulses.  Lymph: No lymphadenopathy of posterior or anterior cervical chain or axillae bilaterally.  Gait antalgic with mild right foot drop patient does have a positive Trendelenburg on the left side MSK:  Non tender with full range of motion and good stability and symmetric strength and tone of shoulders, elbows, wrist, , and ankles bilaterally. Arthritic changes of multiple joints   Left knee does show degenerative changes. Patient has mild valgus deformity with as well as instability with valgus force. Moderate tenderness to palpation over the medial joint line.continue mild effusion Worsening from previous exam  Back Exam:  Inspection:Lumbar scoliosis noted. Motion: Flexion 30 deg, Extension 15 deg Worsening symptoms, Side Bending to 20 deg bilaterally,  Rotation to 20 deg bilaterally no significant change no significant improvement. SLR laying: Negative patient does have significant tightness of the hamstrings XSLR laying: Negative  Continue increasing tightness but not tenderness from previous exam FABER: negative. Sensory change: Gross sensation intact to all lumbar and sacral dermatomes.  Reflexes: 2+ at both patellar  tendons, 2+ at achilles tendons, Babinski's downgoing. Patient though does have loss of hair of his lower extremity bilaterally Strength at foot  Plantar-flexion: 5/5 Dorsi-flexion: 5/5 Eversion: 5/5 Inversion: 5/5  Leg strength  Quad: 5/5 Hamstring: 5/5 Hip flexor: 5/5 Hip abductors: 4/5  Minimal change from previous exam    Impression and Recommendations:     This case required medical decision making of moderate complexity.

## 2016-09-16 ENCOUNTER — Ambulatory Visit (INDEPENDENT_AMBULATORY_CARE_PROVIDER_SITE_OTHER): Payer: Medicare Other | Admitting: Family Medicine

## 2016-09-16 ENCOUNTER — Encounter: Payer: Self-pay | Admitting: Family Medicine

## 2016-09-16 DIAGNOSIS — M1712 Unilateral primary osteoarthritis, left knee: Secondary | ICD-10-CM

## 2016-09-16 DIAGNOSIS — M48062 Spinal stenosis, lumbar region with neurogenic claudication: Secondary | ICD-10-CM | POA: Diagnosis not present

## 2016-09-16 MED ORDER — VITAMIN D (ERGOCALCIFEROL) 1.25 MG (50000 UNIT) PO CAPS: 50000.0000 [IU] | ORAL_CAPSULE | ORAL | 0 refills | Status: DC

## 2016-09-16 NOTE — Assessment & Plan Note (Signed)
Discussed with patient,  Declined custom brace or injection.  Given topical NSAIDs  Ice noted.  RTC in 2 months.

## 2016-09-16 NOTE — Assessment & Plan Note (Signed)
Patient is having some mild worsening symptoms. Discussed with patient again at great length. Patient stated that he continues to have pain but once to continue with conservative therapy. We discussed increasing patient's gabapentin to 400 mg at night which she will attempt. We discussed other medications which she declined. We discussed formal physical therapy which she also declined. Patient will continue be active. We discussed that advance imaging could be warranted impossible epidurals could be of benefit. Patient was at this time does not want do anything of that aggressive modality. Patient will come back and see me again in 2-3 months for further evaluation and treatment.

## 2016-09-16 NOTE — Patient Instructions (Signed)
Good to see you  Increase gabapentin to 400mg  at night and continue the 100mg  in AM Add tart cherry eXtract to night pills Continue the allopurionl for the gout.  Once weekly vitamin D for next 12 weeks  Over the counter get  B12 1035mcg daily  B6 200mg  daily  This can help with thew neuropathy.   Xerelo shoes could be good at [employer] If back worsens or knee worsens we may need injections.  pennsaid pinkie amount topically 2 times daily as needed.   See me again in 2-3 months.

## 2016-09-20 ENCOUNTER — Ambulatory Visit: Payer: Medicare Other | Admitting: Family Medicine

## 2016-09-20 DIAGNOSIS — K3189 Other diseases of stomach and duodenum: Secondary | ICD-10-CM | POA: Diagnosis not present

## 2016-09-20 DIAGNOSIS — R932 Abnormal findings on diagnostic imaging of liver and biliary tract: Secondary | ICD-10-CM | POA: Diagnosis not present

## 2016-09-20 DIAGNOSIS — R935 Abnormal findings on diagnostic imaging of other abdominal regions, including retroperitoneum: Secondary | ICD-10-CM | POA: Diagnosis not present

## 2016-09-20 DIAGNOSIS — K838 Other specified diseases of biliary tract: Secondary | ICD-10-CM | POA: Diagnosis not present

## 2016-10-11 DIAGNOSIS — Z85828 Personal history of other malignant neoplasm of skin: Secondary | ICD-10-CM | POA: Diagnosis not present

## 2016-10-11 DIAGNOSIS — L570 Actinic keratosis: Secondary | ICD-10-CM | POA: Diagnosis not present

## 2016-10-11 DIAGNOSIS — L821 Other seborrheic keratosis: Secondary | ICD-10-CM | POA: Diagnosis not present

## 2016-10-11 DIAGNOSIS — D1801 Hemangioma of skin and subcutaneous tissue: Secondary | ICD-10-CM | POA: Diagnosis not present

## 2016-10-11 DIAGNOSIS — L853 Xerosis cutis: Secondary | ICD-10-CM | POA: Diagnosis not present

## 2016-10-11 DIAGNOSIS — L723 Sebaceous cyst: Secondary | ICD-10-CM | POA: Diagnosis not present

## 2016-12-06 NOTE — Progress Notes (Signed)
[name] [address] [place], [place] [address] Phone: [phone] Subjective:    I'm seeing this patient by the request  of:  Irven Shelling, MD   CC: arthralgias and low back pain follow-up  [id number]  Patrick Franco is a 83 y.o. male coming in with complaint of left hip pain. It appears the patient's pain was likely coming from his back and likely underlying spinal stenosis. X-rays that show moderate osteoarthritic changes. On gabapentin 100/100/333m. Patient has been going on for the last several weeks again out of city. Patient states Continues to have pain seems to be located still on the left hip. Does have radiation going down the leg. Still having times when he feels like his legs give out on him. Did have 1 episode where he did fall while playing tennis secondary to weakness of the left leg. Feels that this is becoming worse. States that quality of life is worsening as well.  Patient does have knee pain. Seems to be more on the left side. Patient states there is some mild instability. Patient has been doing relatively well with conservative therapy. Patient states stable and moment. More concerning about his left hip.   Past Medical History:  Diagnosis Date  . BPH (benign prostatic hyperplasia)   . Chronic low back pain   . Depression   . Diverticulitis   . Elevated PSA   . Foot drop, right   . Foot drop, right   . Gait disorder 02/03/2015  . Hip pain   . History of colonoscopy   . HOH (hard of hearing)    hearing aid  . Hypertension   . Lumbar radiculopathy   . Mitral valve prolapse   . Onychomycosis   . Peptic ulcer disease   . [place] ([facility]   e. coli-after prostate bx  . Sleep apnea   . Torn ACL    Past Surgical History:  Procedure Laterality Date  . CATARACT EXTRACTION Bilateral   . ESOPHAGOGASTRODUODENOSCOPY N/A 08/19/2015   Procedure: ESOPHAGOGASTRODUODENOSCOPY (EGD);  Surgeon: JLaurence Spates MD;  [facility];  Service: Endoscopy;  Laterality: N/A;  . right knee replacement Right 1996  . TONSILLECTOMY     Social History  Substance Use Topics  . Smoking status: Never Smoker  . Smokeless tobacco: Never Used  . Alcohol use Not on file   No Known Allergies Family History  Problem Relation Age of Onset  . Pneumonia Mother 857 . Cancer - Lung Father 580 . Multiple myeloma Brother 588      Past Medical History:  Diagnosis Date  . BPH (benign prostatic hyperplasia)   . Chronic low back pain   . Depression   . Diverticulitis   . Elevated PSA   . Foot drop, right   . Foot drop, right   . Gait disorder 02/03/2015  . Hip pain   . History of colonoscopy   . HOH (hard of hearing)    hearing aid  . Hypertension   . Lumbar radiculopathy   . Mitral valve prolapse   . Onychomycosis   . Peptic ulcer disease   . Sepsis ([place] 2005   e. coli-after prostate bx  . Sleep apnea   . Torn ACL    Past Surgical History:  Procedure Laterality Date  . CATARACT EXTRACTION Bilateral   . ESOPHAGOGASTRODUODENOSCOPY N/A 08/19/2015   Procedure: ESOPHAGOGASTRODUODENOSCOPY (EGD);  Surgeon: JLaurence Spates MD;  [facility]  Service:  Endoscopy;  Laterality: N/A;  . right knee replacement Right 1996  . TONSILLECTOMY     Social History  Substance Use Topics  . Smoking status: Never Smoker  . Smokeless tobacco: Never Used  . Alcohol use Not on file   No Known Allergies Family History  Problem Relation Age of Onset  . Pneumonia Mother 42  . Cancer - Lung Father 3  . Multiple myeloma Brother 75    Past medical history, social, surgical and family history all reviewed in electronic medical record.   Review of Systems: No headache, visual changes, nausea, vomiting, diarrhea, constipation, dizziness, abdominal pain, skin rash, fevers, chills, night sweats, weight loss, swollen lymph nodes,  chest pain, shortness of breath, mood changes.  Some difficulty with memory he states. Mild  increase in muscle aches and body aches.  Objective  Blood pressure 138/80, pulse 70, height _0  (1.753 m), weight 212 lb (96.2 kg), SpO2 96 %.  Systems examined below as of 12/07/16 General: NAD A&O x3 mood, affect normal  HEENT: Pupils equal, extraocular movements intact no nystagmus Respiratory: not short of breath at rest or with speaking Cardiovascular: No lower extremity edema, non tender Skin: Warm dry intact with no signs of infection or rash on extremities or on axial skeleton. Abdomen: Soft nontender, no masses Neuro: Cranial nerves  intact, neurovascularly intact in all extremities with 2+ DTRs and 2+ pulses. Lymph: No lymphadenopathy appreciated today    Gait antalgic with mild right foot drop patient does have a positive Trendelenburg on the left side possibly worse than previous exam mild more of a shuffling gait MSK:  Non tender with full range of motion and good stability and symmetric strength and tone of shoulders, elbows, wrist, , and ankles bilaterally. Arthritic changes of multiple joints   Left knee does show degenerative changes. Patient has mild valgus deformity with as well as instability with valgus force. Continued tenderness over the medial joint line. Patient's right knee also shows tenderness over the right line.  Back Exam:  Inspection:Lumbar scoliosis noted. Motion: Flexion 25 deg, Extension 10 deg Worsening symptoms, Side Bending to 20 deg bilaterally,  Rotation to 20 deg bilaterally no significant change no significant improvement. SLR laying: Negative patient does have significant tightness of the hamstrings XSLR laying: Negative  Patient is having worsening tightness. Worsening palpation of the paraspinal musculature as well. FABER: negative. Sensory change: Gross sensation intact to all lumbar and sacral dermatomes.  Reflexes: 2+ at both patellar tendons, 2+ at achilles tendons, Babinski's downgoing. Patient though does have loss of hair of his lower  extremity bilaterally Strength at foot  4/5 strength on the lower extremity is. This is worse. Especially on the left side at this point.    Impression and Recommendations:     This case required medical decision making of moderate complexity.

## 2016-12-07 ENCOUNTER — Ambulatory Visit (INDEPENDENT_AMBULATORY_CARE_PROVIDER_SITE_OTHER): Payer: Medicare Other | Admitting: Orthopedic Surgery

## 2016-12-07 ENCOUNTER — Encounter: Payer: Self-pay | Admitting: Family Medicine

## 2016-12-07 ENCOUNTER — Ambulatory Visit (INDEPENDENT_AMBULATORY_CARE_PROVIDER_SITE_OTHER): Payer: Medicare Other | Admitting: Family Medicine

## 2016-12-07 VITALS — BP 138/80 | HR 70 | Ht 69.0 in | Wt 212.0 lb

## 2016-12-07 DIAGNOSIS — M1712 Unilateral primary osteoarthritis, left knee: Secondary | ICD-10-CM

## 2016-12-07 DIAGNOSIS — M48062 Spinal stenosis, lumbar region with neurogenic claudication: Secondary | ICD-10-CM

## 2016-12-07 NOTE — Patient Instructions (Signed)
Good to see you  I will get MRI of your back and look for the spinal stenosis.  I will call you and we will discuss epidural  COntinue the medicines you are on.  If we do the epidural then see me again 2 weeks later.

## 2016-12-08 NOTE — Assessment & Plan Note (Signed)
I believe the patient is having more of a neurogenic claudication at this point likely secondary to spinal stenosis. Worsening symptoms noted on exam today. Likely no significant change in deep tendon reflexes. I do believe though that patient needs advance imaging. Patient would be a candidate for epidural if possible. Patient is not on a blood thinner. I think patient would respond well. Patient will continue the same therapy otherwise. Follow-up again with me 2 weeks after epidural. Spent  25 minutes with patient face-to-face and had greater than 50% of counseling including as described above in assessment and plan.

## 2016-12-08 NOTE — Assessment & Plan Note (Signed)
Degenerative changes of left knee is stable.

## 2016-12-15 ENCOUNTER — Ambulatory Visit
Admission: RE | Admit: 2016-12-15 | Discharge: 2016-12-15 | Disposition: A | Discharge status: Home / Self Care | Payer: Medicare Other | Source: Ambulatory Visit | Attending: Family Medicine | Admitting: Family Medicine

## 2016-12-15 DIAGNOSIS — M48062 Spinal stenosis, lumbar region with neurogenic claudication: Secondary | ICD-10-CM

## 2016-12-15 DIAGNOSIS — M48061 Spinal stenosis, lumbar region without neurogenic claudication: Secondary | ICD-10-CM | POA: Diagnosis not present

## 2016-12-16 ENCOUNTER — Telehealth: Payer: Self-pay | Admitting: Family Medicine

## 2016-12-16 DIAGNOSIS — M48062 Spinal stenosis, lumbar region with neurogenic claudication: Secondary | ICD-10-CM

## 2016-12-16 NOTE — Telephone Encounter (Signed)
Called patient back and told him mild spinal stenosis, severe arthritic changes, severe foraminal narrowing. More on the right sided in the patient's symptoms or on the left will try an L3-L4 epidural.

## 2016-12-16 NOTE — Telephone Encounter (Signed)
Pt is going to be in [place] next week and wants Dr. Tamala Julian to call his cell regarding results of MRI and possibility of epidural inj? Please advise MS

## 2016-12-19 NOTE — Telephone Encounter (Signed)
Epidural ordered & sent to [facility].  

## 2016-12-20 ENCOUNTER — Ambulatory Visit: Payer: Medicare Other | Admitting: Family Medicine

## 2016-12-28 ENCOUNTER — Ambulatory Visit
Admission: RE | Admit: 2016-12-28 | Discharge: 2016-12-28 | Disposition: A | Discharge status: Home / Self Care | Payer: Medicare Other | Source: Ambulatory Visit | Attending: Family Medicine | Admitting: Family Medicine

## 2016-12-28 DIAGNOSIS — M47817 Spondylosis without myelopathy or radiculopathy, lumbosacral region: Secondary | ICD-10-CM | POA: Diagnosis not present

## 2016-12-28 DIAGNOSIS — M48062 Spinal stenosis, lumbar region with neurogenic claudication: Secondary | ICD-10-CM

## 2016-12-28 MED ORDER — METHYLPREDNISOLONE ACETATE 40 MG/ML INJ SUSP (RADIOLOG
120.0000 mg | Freq: Once | INTRAMUSCULAR | Status: AC
  Administered 2016-12-28: 120 mg via EPIDURAL

## 2016-12-28 MED ORDER — IOPAMIDOL (ISOVUE-M 200) INJECTION 41%
1.0000 mL | Freq: Once | INTRAMUSCULAR | Status: AC
  Administered 2016-12-28: 1 mL via EPIDURAL

## 2016-12-28 NOTE — Discharge Instructions (Signed)

## 2016-12-30 ENCOUNTER — Other Ambulatory Visit: Payer: Self-pay | Admitting: Family Medicine

## 2016-12-30 NOTE — Telephone Encounter (Signed)
Refill done,  

## 2017-01-11 ENCOUNTER — Encounter: Payer: Self-pay | Admitting: Family Medicine

## 2017-01-11 ENCOUNTER — Ambulatory Visit (INDEPENDENT_AMBULATORY_CARE_PROVIDER_SITE_OTHER): Payer: Medicare Other | Admitting: Family Medicine

## 2017-01-11 VITALS — BP 112/62 | HR 74 | Ht 70.0 in | Wt 198.0 lb

## 2017-01-11 DIAGNOSIS — M48062 Spinal stenosis, lumbar region with neurogenic claudication: Secondary | ICD-10-CM | POA: Diagnosis not present

## 2017-01-11 NOTE — Progress Notes (Signed)
[name] D.O. [facility] [address] [place], [place] [address] Phone: [phone] Subjective:    I'm seeing this patient by the request  of:  GRIFFIN,JOHN JOSEPH, MD   CC:low back pain follow-up  HPI:Subjective  Patrick Franco is a 83 y.o. male coming in with complaint of left hip pain. It appears the patient's pain was likely coming from his back and likely underlying spinal stenosis. X-rays that show moderate osteoarthritic changes. On gabapentin 100/100/300mg. Patient was having worsening symptoms at last follow-up. Elected to get MRI of the lumbar spine. MRI did show that patient did have moderate to severe foraminal narrowing and right bone forearm and left L5 seeming to be the worse. Patient also has moderate spinal stenosis. Patient minimally tender in the left L3-L4 epidural injection. Patient states Doing much better after the epidural. Patient states that he has 100% better at this time. Monsel start increasing his activity and what to make sure that he is doing relatively well. Patient does make sure that he can start increasing activity as tolerated. No new symptoms.    Past Medical History:  Diagnosis Date  . BPH (benign prostatic hyperplasia)   . Chronic low back pain   . Depression   . Diverticulitis   . Elevated PSA   . Foot drop, right   . Foot drop, right   . Gait disorder 02/03/2015  . Hip pain   . History of colonoscopy   . HOH (hard of hearing)    hearing aid  . Hypertension   . Lumbar radiculopathy   . Mitral valve prolapse   . Onychomycosis   . Peptic ulcer disease   . Sepsis (HCC) 2005   e. coli-after prostate bx  . Sleep apnea   . Torn ACL    Past Surgical History:  Procedure Laterality Date  . CATARACT EXTRACTION Bilateral   . ESOPHAGOGASTRODUODENOSCOPY N/A 08/19/2015   Procedure: ESOPHAGOGASTRODUODENOSCOPY (EGD);  Surgeon: James Edwards, MD;  Location: [facility];  Service: Endoscopy;  Laterality: N/A;  . right knee replacement  Right 1996  . TONSILLECTOMY     Social History  Substance Use Topics  . Smoking status: Never Smoker  . Smokeless tobacco: Never Used  . Alcohol use Not on file   No Known Allergies Family History  Problem Relation Age of Onset  . Pneumonia Mother 82  . Cancer - Lung Father 56  . Multiple myeloma Brother 55       Past Medical History:  Diagnosis Date  . BPH (benign prostatic hyperplasia)   . Chronic low back pain   . Depression   . Diverticulitis   . Elevated PSA   . Foot drop, right   . Foot drop, right   . Gait disorder 02/03/2015  . Hip pain   . History of colonoscopy   . HOH (hard of hearing)    hearing aid  . Hypertension   . Lumbar radiculopathy   . Mitral valve prolapse   . Onychomycosis   . Peptic ulcer disease   . Sepsis (HCC) 2005   e. coli-after prostate bx  . Sleep apnea   . Torn ACL    Past Surgical History:  Procedure Laterality Date  . CATARACT EXTRACTION Bilateral   . ESOPHAGOGASTRODUODENOSCOPY N/A 08/19/2015   Procedure: ESOPHAGOGASTRODUODENOSCOPY (EGD);  Surgeon: James Edwards, MD;  Location: [facility];  Service: Endoscopy;  Laterality: N/A;  . right knee replacement Right 1996  . TONSILLECTOMY     Social History    Substance Use Topics  . Smoking status: Never Smoker  . Smokeless tobacco: Never Used  . Alcohol use Not on file   No Known Allergies Family History  Problem Relation Age of Onset  . Pneumonia Mother 82  . Cancer - Lung Father 56  . Multiple myeloma Brother 55    Past medical history, social, surgical and family history all reviewed in electronic medical record.   Review of Systems: No headache, visual changes, nausea, vomiting, diarrhea, constipation, dizziness, abdominal pain, skin rash, fevers, chills, night sweats, weight loss, swollen lymph nodes, body aches, joint swelling, , chest pain, shortness of breath, mood changes.  Continue mild muscle aches.  Objective  Blood pressure 112/62, pulse 74, height 5' 10"  (1.778 m), weight 198 lb (89.8 kg).  Systems examined below as of 01/11/17 General: NAD A&O x3 mood, affect normal  HEENT: Pupils equal, extraocular movements intact no nystagmus Respiratory: not short of breath at rest or with speaking Cardiovascular: No lower extremity edema, non tender Skin: Warm dry intact with no signs of infection or rash on extremities or on axial skeleton. Abdomen: Soft nontender, no masses Neuro: Cranial nerves  intact, neurovascularly intact in all extremities with 2+ DTRs and 2+ pulses. Lymph: No lymphadenopathy appreciated today  Gait improved gait MSK:  Non tender with full range of motion and good stability and symmetric strength and tone of shoulders, elbows, wrist, , and ankles bilaterally. Arthritic changes of multiple joints   Back Exam:  Inspection:Lumbar scoliosis noted. Motion: Flexion 25 deg, Extension 15 deg side bending to 20 bilaterally Rotation to 25 deg bilaterally  SLR laying: Less tightness of the hamstrings today. XSLR laying: Negative  Mild discomfort in the paraspinal musculature lumbar spine still remaining FABER: negative. Sensory change: Gross sensation intact to all lumbar and sacral dermatomes.  Reflexes: 2+ at both patellar tendons, 2+ at achilles tendons, Babinski's downgoing. Strength at foot  4+/5 strength on the lower extremity. Improvement from previous exam    Impression and Recommendations:     This case required medical decision making of moderate complexity.     

## 2017-01-11 NOTE — Assessment & Plan Note (Signed)
Much better COntinue with current therapy.  Can repeat injection if needed Start PT for strength  RTC in 6 weeks.

## 2017-01-11 NOTE — Patient Instructions (Signed)
Go to see you  Patrick Franco is your friend.  Stay active.  Start increasing activity slowly  PT will be calling you  Can try to decrease daytime dosing of gabapentin for a week.  If it worsens start the medicine again or if no change then stay off of it.  No longer need the daily vitamin D  See me again in 6 weeks.

## 2017-01-16 DIAGNOSIS — G4733 Obstructive sleep apnea (adult) (pediatric): Secondary | ICD-10-CM | POA: Diagnosis not present

## 2017-01-17 DIAGNOSIS — M545 Low back pain: Secondary | ICD-10-CM | POA: Diagnosis not present

## 2017-01-17 DIAGNOSIS — M419 Scoliosis, unspecified: Secondary | ICD-10-CM | POA: Diagnosis not present

## 2017-01-24 DIAGNOSIS — M545 Low back pain: Secondary | ICD-10-CM | POA: Diagnosis not present

## 2017-01-24 DIAGNOSIS — M419 Scoliosis, unspecified: Secondary | ICD-10-CM | POA: Diagnosis not present

## 2017-01-26 DIAGNOSIS — Z0000 Encounter for general adult medical examination without abnormal findings: Secondary | ICD-10-CM | POA: Diagnosis not present

## 2017-01-26 DIAGNOSIS — Z1389 Encounter for screening for other disorder: Secondary | ICD-10-CM | POA: Diagnosis not present

## 2017-01-26 DIAGNOSIS — N401 Enlarged prostate with lower urinary tract symptoms: Secondary | ICD-10-CM | POA: Diagnosis not present

## 2017-01-26 DIAGNOSIS — R351 Nocturia: Secondary | ICD-10-CM | POA: Diagnosis not present

## 2017-01-26 DIAGNOSIS — I10 Essential (primary) hypertension: Secondary | ICD-10-CM | POA: Diagnosis not present

## 2017-01-26 DIAGNOSIS — R269 Unspecified abnormalities of gait and mobility: Secondary | ICD-10-CM | POA: Diagnosis not present

## 2017-01-26 DIAGNOSIS — G4733 Obstructive sleep apnea (adult) (pediatric): Secondary | ICD-10-CM | POA: Diagnosis not present

## 2017-02-08 DIAGNOSIS — M545 Low back pain: Secondary | ICD-10-CM | POA: Diagnosis not present

## 2017-02-08 DIAGNOSIS — M419 Scoliosis, unspecified: Secondary | ICD-10-CM | POA: Diagnosis not present

## 2017-02-16 DIAGNOSIS — M419 Scoliosis, unspecified: Secondary | ICD-10-CM | POA: Diagnosis not present

## 2017-02-16 DIAGNOSIS — M545 Low back pain: Secondary | ICD-10-CM | POA: Diagnosis not present

## 2017-02-21 ENCOUNTER — Ambulatory Visit (INDEPENDENT_AMBULATORY_CARE_PROVIDER_SITE_OTHER): Payer: Medicare Other | Admitting: Family Medicine

## 2017-02-21 ENCOUNTER — Encounter: Payer: Self-pay | Admitting: Family Medicine

## 2017-02-21 DIAGNOSIS — F32A Depression, unspecified: Secondary | ICD-10-CM | POA: Insufficient documentation

## 2017-02-21 DIAGNOSIS — F3289 Other specified depressive episodes: Secondary | ICD-10-CM | POA: Diagnosis not present

## 2017-02-21 DIAGNOSIS — F329 Major depressive disorder, single episode, unspecified: Secondary | ICD-10-CM | POA: Insufficient documentation

## 2017-02-21 DIAGNOSIS — M419 Scoliosis, unspecified: Secondary | ICD-10-CM | POA: Diagnosis not present

## 2017-02-21 DIAGNOSIS — M48062 Spinal stenosis, lumbar region with neurogenic claudication: Secondary | ICD-10-CM | POA: Diagnosis not present

## 2017-02-21 DIAGNOSIS — M545 Low back pain: Secondary | ICD-10-CM | POA: Diagnosis not present

## 2017-02-21 MED ORDER — MIRTAZAPINE 7.5 MG PO TABS: 7.5000 mg | ORAL_TABLET | Freq: Every day | ORAL | 1 refills | Status: DC

## 2017-02-21 NOTE — Assessment & Plan Note (Signed)
Patient is having signs and symptoms of depression has had it previously. No suicidal or homicidal ideation. Declined any type of intervention such as psychotherapy. We will discuss with primary care provider but will start on Remeron. Discussed with patient if any severe changes in mood and to call immediately or seek medical attention. Patient will come back on 3-4 weeks to make sure the dosing as appropriate.

## 2017-02-21 NOTE — Patient Instructions (Signed)
Good to see you  Thank you for telling me for what is going on.  Ice still to the back.  Continue all the other medicines.  Stay active.  Remeron 7.5 mg at night to help with the mood.  If any worsening mood then call me  See me again in 3 weeks.

## 2017-02-21 NOTE — Assessment & Plan Note (Signed)
Back is stable at this time. We discussed icing regimen and home exercises. We discussed objective is to do a which ones to avoid. Encourage patient to stay active overall. Patient will continue same medications. Follow-up again in 3-4 weeks.

## 2017-02-21 NOTE — Progress Notes (Signed)
Pre-visit discussion using our clinic review tool. No additional management support is needed unless otherwise documented below in the visit note.  

## 2017-02-21 NOTE — Progress Notes (Signed)
[name] [place], [place] [address] Phone: [phone] Subjective:    I'm seeing this patient by the request  of:  Irven Shelling, MD   CC:low back pain follow-up  FVC:[id number]  Patrick Franco is a 83 y.o. male coming in with complaint of left hip pain. It appears the patient's pain was likely coming from his back and likely underlying spinal stenosis. X-rays that show moderate osteoarthritic changes. On gabapentin 100/100/357m. Patient was having worsening symptoms at last follow-up. Elected to get MRI of the lumbar spine. MRI did show that patient did have moderate to severe foraminal narrowing and right bone forearm and left L5 seeming to be the worse. Patient also has moderate spinal stenosis. Patient minimally tender in the left L3-L4 epidural injection. Patient states Doing much better after the epidural. Patient states that he is doing significantly better still. Has even started to play tennis. Is doing overall much better.  Patient has noticed that his mood changes over the course last several months. Patient's family is concerned that he has been somewhat depressed. Denies any type of significant memory problems. Patient just does not feel like himself. Was on antidepressants 2 years ago and discontinue because he did not feel like he needed him. Patient states that now he is having difficulty with sleeping, finding it difficult to enjoy things and avoiding social interaction. Denies any suicidal or homicidal ideation.    Past Medical History:  Diagnosis Date  . BPH (benign prostatic hyperplasia)   . Chronic low back pain   . Depression   . Diverticulitis   . Elevated PSA   . Foot drop, right   . Foot drop, right   . Gait disorder 02/03/2015  . Hip pain   . History of colonoscopy   . HOH (hard of hearing)    hearing aid  . Hypertension   . Lumbar radiculopathy   . Mitral valve prolapse   . Onychomycosis   . Peptic ulcer  disease   . [place]epsis ([place] 2005   e. coli-after prostate bx  . Sleep apnea   . Torn ACL    Past Surgical History:  Procedure Laterality Date  . CATARACT EXTRACTION Bilateral   . ESOPHAGOGASTRODUODENOSCOPY N/A 08/19/2015   Procedure: ESOPHAGOGASTRODUODENOSCOPY (EGD);  Surgeon: JLaurence Spates MD;  [facility];  Service: Endoscopy;  Laterality: N/A;  . right knee replacement Right 1996  . TONSILLECTOMY     Social History  Substance Use Topics  . Smoking status: Never Smoker  . Smokeless tobacco: Never Used  . Alcohol use Not on file   No Known Allergies Family History  Problem Relation Age of Onset  . Pneumonia Mother 888 . Cancer - Lung Father 589 . Multiple myeloma Brother 526      Past Medical History:  Diagnosis Date  . BPH (benign prostatic hyperplasia)   . Chronic low back pain   . Depression   . Diverticulitis   . Elevated PSA   . Foot drop, right   . Foot drop, right   . Gait disorder 02/03/2015  . Hip pain   . History of colonoscopy   . HOH (hard of hearing)    hearing aid  . Hypertension   . Lumbar radiculopathy   . Mitral valve prolapse   . Onychomycosis   . Peptic ulcer disease   . [place]epsis ([place] 2005   e. coli-after prostate bx  . Sleep apnea   .  Torn ACL    Past Surgical History:  Procedure Laterality Date  . CATARACT EXTRACTION Bilateral   . ESOPHAGOGASTRODUODENOSCOPY N/A 08/19/2015   Procedure: ESOPHAGOGASTRODUODENOSCOPY (EGD);  Surgeon: Laurence Spates, MD;  Location: [facility] ENDOSCOPY;  Service: Endoscopy;  Laterality: N/A;  . right knee replacement Right 1996  . TONSILLECTOMY     Social History  Substance Use Topics  . Smoking status: Never Smoker  . Smokeless tobacco: Never Used  . Alcohol use Not on file   No Known Allergies Family History  Problem Relation Age of Onset  . Pneumonia Mother 19  . Cancer - Lung Father 22  . Multiple myeloma Brother 16    Past medical history, social, surgical and family history all reviewed in  electronic medical record.   Review of Systems: No headache, visual changes, nausea, vomiting, diarrhea, constipation, dizziness, abdominal pain, skin rash, fevers, chills, night sweats, weight loss, swollen lymph nodes, body aches, joint swelling, muscle aches, chest pain, shortness of breath.  Positive mood changes  Objective  Blood pressure 132/64, pulse (!) 51, resp. rate 16, weight 200 lb 2 oz (90.8 kg), SpO2 97 %.  Systems examined below as of 02/21/17 General: NAD A&O x3 mood, affect normal Tearful HEENT: Pupils equal, extraocular movements intact no nystagmus Respiratory: not short of breath at rest or with speaking Cardiovascular: No lower extremity edema, non tender Skin: Warm dry intact with no signs of infection or rash on extremities or on axial skeleton. Abdomen: Soft nontender, no masses Neuro: Cranial nerves  intact, neurovascularly intact in all extremities with 2+ DTRs and 2+ pulses. Lymph: No lymphadenopathy appreciated today  Gait improved gait MSK:  Non tender with full range of motion and good stability and symmetric strength and tone of shoulders, elbows, wrist, , and ankles bilaterally. Arthritic changes of multiple joints   Back Exam:  Inspection:Lumbar scoliosis noted. Motion: Flexion 25 deg, Extension 15 deg side bending to 20 bilaterally Rotation to 25 deg bilaterally  SLR laying: Less tightness of the hamstrings today. XSLR laying: Negative  Mild discomfort in the paraspinal musculature lumbar spine still remaining FABER: negative. Sensory change: Gross sensation intact to all lumbar and sacral dermatomes.  Reflexes: 2+ at both patellar tendons, 2+ at achilles tendons, Babinski's downgoing. Strength at foot  4+/5 strength on the lower extremity. Improvement from previous exam    Impression and Recommendations:     This case required medical decision making of moderate complexity.

## 2017-03-01 DIAGNOSIS — M545 Low back pain: Secondary | ICD-10-CM | POA: Diagnosis not present

## 2017-03-01 DIAGNOSIS — M419 Scoliosis, unspecified: Secondary | ICD-10-CM | POA: Diagnosis not present

## 2017-03-08 ENCOUNTER — Telehealth: Payer: Self-pay | Admitting: Internal Medicine

## 2017-03-08 NOTE — Telephone Encounter (Signed)
Routing to Patrick Franco----dr Tamala Julian is out of office until 4/30---patient has appt with dr Tamala Julian on 5/1---not sure if you can handle this in the absence of dr Tamala Julian, if not, can you let dr Tamala Julian know when he returns, per samantha/sched, patient advised that dr Tamala Julian is out of office until 4/30---see  if any questions, thanks

## 2017-03-08 NOTE — Telephone Encounter (Signed)
Pt would like a order/authorization sent to [facility] for epidural. He last one 12/28/16

## 2017-03-09 ENCOUNTER — Other Ambulatory Visit: Payer: Self-pay

## 2017-03-09 ENCOUNTER — Telehealth: Payer: Self-pay | Admitting: Internal Medicine

## 2017-03-09 DIAGNOSIS — M5416 Radiculopathy, lumbar region: Secondary | ICD-10-CM

## 2017-03-09 NOTE — Telephone Encounter (Signed)
Pt would like for you call him regarding an epidural. He has an appointment on Monday but wanted to speak with you.

## 2017-03-09 NOTE — Telephone Encounter (Signed)
Order for epidural placed per Dr. Tamala Julian.

## 2017-03-10 NOTE — Telephone Encounter (Signed)
Spoke with patient. He wanted to know if he needed to come in to speak with Dr. Tamala Julian about getting an epidural. Dr. Tamala Julian approved epidural and order was dropped so told patient to call back to schedule a 2 week f/u post-epidural. Patient vocalized that is what he would like to do. Monday's appointment will be cancelled per patient.

## 2017-03-13 ENCOUNTER — Ambulatory Visit: Payer: Medicare Other | Admitting: Family Medicine

## 2017-03-14 ENCOUNTER — Ambulatory Visit
Admission: RE | Admit: 2017-03-14 | Discharge: 2017-03-14 | Disposition: A | Discharge status: Home / Self Care | Payer: Medicare Other | Source: Ambulatory Visit | Attending: Family Medicine | Admitting: Family Medicine

## 2017-03-14 ENCOUNTER — Other Ambulatory Visit: Payer: Self-pay | Admitting: Family Medicine

## 2017-03-14 ENCOUNTER — Ambulatory Visit: Payer: Medicare Other | Admitting: Family Medicine

## 2017-03-14 DIAGNOSIS — M47817 Spondylosis without myelopathy or radiculopathy, lumbosacral region: Secondary | ICD-10-CM | POA: Diagnosis not present

## 2017-03-14 DIAGNOSIS — M5416 Radiculopathy, lumbar region: Secondary | ICD-10-CM

## 2017-03-14 MED ORDER — METHYLPREDNISOLONE ACETATE 40 MG/ML INJ SUSP (RADIOLOG
120.0000 mg | Freq: Once | INTRAMUSCULAR | Status: AC
  Administered 2017-03-14: 120 mg via EPIDURAL

## 2017-03-14 MED ORDER — IOPAMIDOL (ISOVUE-M 200) INJECTION 41%
1.0000 mL | Freq: Once | INTRAMUSCULAR | Status: AC
Start: 2017-03-14 — End: 2017-03-14
  Administered 2017-03-14: 1 mL via EPIDURAL

## 2017-03-14 NOTE — Discharge Instructions (Signed)

## 2017-03-20 DIAGNOSIS — M419 Scoliosis, unspecified: Secondary | ICD-10-CM | POA: Diagnosis not present

## 2017-03-20 DIAGNOSIS — M545 Low back pain: Secondary | ICD-10-CM | POA: Diagnosis not present

## 2017-03-29 DIAGNOSIS — M419 Scoliosis, unspecified: Secondary | ICD-10-CM | POA: Diagnosis not present

## 2017-03-29 DIAGNOSIS — M545 Low back pain: Secondary | ICD-10-CM | POA: Diagnosis not present

## 2017-03-31 DIAGNOSIS — N402 Nodular prostate without lower urinary tract symptoms: Secondary | ICD-10-CM | POA: Diagnosis not present

## 2017-03-31 DIAGNOSIS — N401 Enlarged prostate with lower urinary tract symptoms: Secondary | ICD-10-CM | POA: Diagnosis not present

## 2017-03-31 DIAGNOSIS — R351 Nocturia: Secondary | ICD-10-CM | POA: Diagnosis not present

## 2017-04-03 NOTE — Progress Notes (Signed)
[facility] [place], [place] [address] Phone: [phone] Subjective:    I'm seeing this patient by the request  of:  Lavone Orn, MD   CC:low back pain follow-up  WSF:[id number]  Patrick Franco is a 83 y.o. male coming in with complaint of left hip pain. It appears the patient's pain was likely coming from his back and likely underlying spinal stenosis. X-rays that show moderate osteoarthritic changes. On gabapentin 100/100/'300mg'$ . Patient was having worsening symptoms at last follow-up. Elected to get MRI of the lumbar spine. MRI did show that patient did have moderate to severe foraminal narrowing and right bone forearm and left L5 seeming to be the worse. Patient also has moderate spinal stenosis. Patient minimally tender in the left L3-L4 epidural injection. Patient states Doing much better after the epidural. Last epidural was made first. Not having any back pain at all. Able to play tennis again.  Patient is also having some difficulty. Patient was having some depressive symptoms. Has felt that the Remeron has helped a little bit. He is sleeping better as well. Patient's family does feel that he needs a little bit more medication potentially. No true side effects to that medicine.    Past Medical History:  Diagnosis Date  . BPH (benign prostatic hyperplasia)   . Chronic low back pain   . Depression   . Diverticulitis   . Elevated PSA   . Foot drop, right   . Foot drop, right   . Gait disorder 02/03/2015  . Hip pain   . History of colonoscopy   . HOH (hard of hearing)    hearing aid  . Hypertension   . Lumbar radiculopathy   . Mitral valve prolapse   . Onychomycosis   . Peptic ulcer disease   . Sepsis ([place]) 2005   e. coli-after prostate bx  . Sleep apnea   . Torn ACL    Past Surgical History:  Procedure Laterality Date  . CATARACT EXTRACTION Bilateral   . ESOPHAGOGASTRODUODENOSCOPY N/A 08/19/2015   Procedure:  ESOPHAGOGASTRODUODENOSCOPY (EGD);  Surgeon: Laurence Spates, MD;  Location: [facility] ENDOSCOPY;  Service: Endoscopy;  Laterality: N/A;  . right knee replacement Right 1996  . TONSILLECTOMY     Social History  Substance Use Topics  . Smoking status: Never Smoker  . Smokeless tobacco: Never Used  . Alcohol use Not on file   No Known Allergies Family History  Problem Relation Age of Onset  . Pneumonia Mother 70  . Cancer - Lung Father 25  . Multiple myeloma Brother 86       Past Medical History:  Diagnosis Date  . BPH (benign prostatic hyperplasia)   . Chronic low back pain   . Depression   . Diverticulitis   . Elevated PSA   . Foot drop, right   . Foot drop, right   . Gait disorder 02/03/2015  . Hip pain   . History of colonoscopy   . HOH (hard of hearing)    hearing aid  . Hypertension   . Lumbar radiculopathy   . Mitral valve prolapse   . Onychomycosis   . Peptic ulcer disease   . Sepsis ([place]) 2005   e. coli-after prostate bx  . Sleep apnea   . Torn ACL    Past Surgical History:  Procedure Laterality Date  . CATARACT EXTRACTION Bilateral   . ESOPHAGOGASTRODUODENOSCOPY N/A 08/19/2015   Procedure: ESOPHAGOGASTRODUODENOSCOPY (EGD);  Surgeon: Laurence Spates, MD;  Location:  [facility];  Service: Endoscopy;  Laterality: N/A;  . right knee replacement Right 1996  . TONSILLECTOMY     Social History  Substance Use Topics  . Smoking status: Never Smoker  . Smokeless tobacco: Never Used  . Alcohol use Not on file   No Known Allergies Family History  Problem Relation Age of Onset  . Pneumonia Mother 31  . Cancer - Lung Father 30  . Multiple myeloma Brother 2    Past medical history, social, surgical and family history all reviewed in electronic medical record.   Review of Systems: No headache, visual changes, nausea, vomiting, diarrhea, constipation, dizziness, abdominal pain, skin rash, fevers, chills, night sweats, weight loss, swollen lymph nodes, body aches, joint  swelling, chest pain, shortness of breath, mood changes.  + muscle aches.   Objective  Blood pressure 130/84, pulse 72, height 5' 10" (1.778 m), weight 208 lb (94.3 kg), SpO2 96 %.  Systems examined below as of 04/04/17 General: NAD A&O x3 mood, affect normal  HEENT: Pupils equal, extraocular movements intact no nystagmus Respiratory: not short of breath at rest or with speaking Cardiovascular: No lower extremity edema, non tender Skin: Warm dry intact with no signs of infection or rash on extremities or on axial skeleton. Abdomen: Soft nontender, no masses Neuro: Cranial nerves  intact, neurovascularly intact in all extremities with 2+ DTRs and 2+ pulses. Lymph: No lymphadenopathy appreciated today  Gait improved gait more steady than previous exam MSK:  Non tender with full range of motion and good stability and symmetric strength and tone of shoulders, elbows, wrist, , and ankles bilaterally. Arthritic changes of multiple joints   Back Exam:  Inspection:Lumbar scoliosis noted. Motion: Flexion 25 deg, Extension 15 deg side bending to 20 bilaterally Rotation to 25 deg bilaterally  SLR laying: Improved XSLR laying: Negative  Nontender on exam FABER: negative. Sensory change: Gross sensation intact to all lumbar and sacral dermatomes.  Reflexes: 2+ at both patellar tendons, 2+ at achilles tendons, Babinski's downgoing. Strength at foot  4+/5 strength on the lower extremity. Improvement from previous exam    Impression and Recommendations:     This case required medical decision making of moderate complexity.

## 2017-04-04 ENCOUNTER — Ambulatory Visit: Payer: Medicare Other | Admitting: Family Medicine

## 2017-04-04 ENCOUNTER — Telehealth: Payer: Self-pay | Admitting: Internal Medicine

## 2017-04-04 ENCOUNTER — Encounter: Payer: Self-pay | Admitting: Family Medicine

## 2017-04-04 ENCOUNTER — Other Ambulatory Visit (HOSPITAL_COMMUNITY)
Admission: RE | Admit: 2017-04-04 | Discharge: 2017-04-04 | Disposition: A | Discharge status: Home / Self Care | Payer: Medicare Other | Source: Ambulatory Visit | Attending: Gastroenterology | Admitting: Gastroenterology

## 2017-04-04 ENCOUNTER — Other Ambulatory Visit: Payer: Self-pay | Admitting: Gastroenterology

## 2017-04-04 ENCOUNTER — Ambulatory Visit (INDEPENDENT_AMBULATORY_CARE_PROVIDER_SITE_OTHER): Payer: Medicare Other | Admitting: Family Medicine

## 2017-04-04 DIAGNOSIS — K3189 Other diseases of stomach and duodenum: Secondary | ICD-10-CM | POA: Diagnosis not present

## 2017-04-04 DIAGNOSIS — K2970 Gastritis, unspecified, without bleeding: Secondary | ICD-10-CM | POA: Diagnosis not present

## 2017-04-04 DIAGNOSIS — M7981 Nontraumatic hematoma of soft tissue: Secondary | ICD-10-CM | POA: Diagnosis not present

## 2017-04-04 DIAGNOSIS — K2930 Chronic superficial gastritis without bleeding: Secondary | ICD-10-CM | POA: Diagnosis not present

## 2017-04-04 DIAGNOSIS — M48062 Spinal stenosis, lumbar region with neurogenic claudication: Secondary | ICD-10-CM

## 2017-04-04 DIAGNOSIS — D1801 Hemangioma of skin and subcutaneous tissue: Secondary | ICD-10-CM | POA: Diagnosis not present

## 2017-04-04 DIAGNOSIS — B379 Candidiasis, unspecified: Secondary | ICD-10-CM | POA: Diagnosis not present

## 2017-04-04 DIAGNOSIS — K229 Disease of esophagus, unspecified: Secondary | ICD-10-CM | POA: Insufficient documentation

## 2017-04-04 DIAGNOSIS — I10 Essential (primary) hypertension: Secondary | ICD-10-CM | POA: Diagnosis not present

## 2017-04-04 DIAGNOSIS — K257 Chronic gastric ulcer without hemorrhage or perforation: Secondary | ICD-10-CM | POA: Diagnosis not present

## 2017-04-04 DIAGNOSIS — Z85828 Personal history of other malignant neoplasm of skin: Secondary | ICD-10-CM | POA: Diagnosis not present

## 2017-04-04 DIAGNOSIS — F3289 Other specified depressive episodes: Secondary | ICD-10-CM

## 2017-04-04 DIAGNOSIS — D490 Neoplasm of unspecified behavior of digestive system: Secondary | ICD-10-CM | POA: Diagnosis not present

## 2017-04-04 DIAGNOSIS — I499 Cardiac arrhythmia, unspecified: Secondary | ICD-10-CM | POA: Diagnosis not present

## 2017-04-04 DIAGNOSIS — L570 Actinic keratosis: Secondary | ICD-10-CM | POA: Diagnosis not present

## 2017-04-04 DIAGNOSIS — R0609 Other forms of dyspnea: Secondary | ICD-10-CM | POA: Diagnosis not present

## 2017-04-04 DIAGNOSIS — L821 Other seborrheic keratosis: Secondary | ICD-10-CM | POA: Diagnosis not present

## 2017-04-04 MED ORDER — MIRTAZAPINE 15 MG PO TABS: 15.0000 mg | ORAL_TABLET | Freq: Every day | ORAL | 1 refills | Status: DC

## 2017-04-04 NOTE — Telephone Encounter (Signed)
Called by Dr. Fara Olden [facility]) regarding an abnormal EKG obtained periprocedurally. I do not have the EKG to personally review, however, she described it to me as sinus rhythm with periods of "unanswered" P waves without QRS and some irregularity, which sounds like PAC's and blocked PAC's. Patrick Franco does not have a cardiologist. He apparently was playing tennis the other day and became fatigued. He denies chest pain. He has a history of mitral valve prolapse. His PCP - Dr. Laurann Montana, ordered an echo to assess this in 09/2016, which was interpreted as having mitral valve prolapse and mild MR - however, there also was a bicuspid aortic valve and moderate aortic stenosis with mild aortic insufficiency.   Currently, I do not believe the EKG changes warrant emergent work-up. He has been referred to [facility] which is appropriate, given his valvular abnormalities. He was instructed to see ER attention if he develops chest pain, severe or worsening shortness of breath, fatigue or passes out.  Pixie Casino, MD, [place]  Attending Cardiologist  Direct Dial: [phone]  Fax: [phone]  Website:  [email or web address]

## 2017-04-04 NOTE — Assessment & Plan Note (Signed)
Improved after second epidural. We will continue the gabapentin at this time that we manage her to titrate with patient being on the Remeron. Some of the tightness in the secondary to muscular changes as well as muscle imbalances. Patient is going out of town for the next 2 months. Patient will come back at that time.

## 2017-04-04 NOTE — Assessment & Plan Note (Signed)
Some improvement at this time. Patient has not having any side effects of medication. Increased Remeron to 50 mg at night. Continue all other medications. Discussed with patient about any suicidal or homicidal ideation which patient declined. Feels like he is having a better outlook of life. Patient will come back and see me again in 2 months.  Spent  25 minutes with patient face-to-face and had greater than 50% of counseling including as described above in assessment and plan focusing on strength and conditioning, action plan if any worsening depression, importance of vitamin D, importance of the medication with family and friends as well as taking time for himself.Patrick Franco

## 2017-04-04 NOTE — Patient Instructions (Addendum)
Good to see you  Ice 20 minutes 2 times daily. Usually after activity and before bed.  Try increasing the remeron to 15mg  at night In 1 week if you want try to decrease gabapentin 200mg  at night  Then if doing good can go down again  [address] See me again in 8-12 weeks.

## 2017-04-05 ENCOUNTER — Telehealth: Payer: Self-pay | Admitting: *Deleted

## 2017-04-05 ENCOUNTER — Encounter: Payer: Self-pay | Admitting: Cardiovascular Disease

## 2017-04-05 NOTE — Telephone Encounter (Signed)
Received a call from Dr Mare Ferrari regarding patient recently having EKG with Afib with history of shortness of breath and chest tightness. Patient had GI procedure yesterday and was found to be in Afib. Patient was seen by Dr Fara Olden  another EKG was done and did appear to be Afib at that time. Spoke with patient and he did have to quit a tennis game a couple of weeks ago secondary to decreased stamina. Scheduled new patient appointment for tomorrow with Dr Angelena Form at 2:00. Patient aware of date, time, and location. Requested records from PCP and he will bring EKG done at GI with him.

## 2017-04-06 ENCOUNTER — Encounter: Payer: Self-pay | Admitting: Cardiovascular Disease

## 2017-04-06 ENCOUNTER — Encounter (INDEPENDENT_AMBULATORY_CARE_PROVIDER_SITE_OTHER): Payer: Self-pay

## 2017-04-06 ENCOUNTER — Encounter (HOSPITAL_COMMUNITY): Payer: Medicare Other

## 2017-04-06 ENCOUNTER — Ambulatory Visit (INDEPENDENT_AMBULATORY_CARE_PROVIDER_SITE_OTHER): Payer: Medicare Other | Admitting: Cardiovascular Disease

## 2017-04-06 ENCOUNTER — Telehealth (HOSPITAL_COMMUNITY): Payer: Self-pay

## 2017-04-06 VITALS — BP 130/60 | HR 54 | Ht 71.0 in | Wt 208.4 lb

## 2017-04-06 DIAGNOSIS — I350 Nonrheumatic aortic (valve) stenosis: Secondary | ICD-10-CM | POA: Diagnosis not present

## 2017-04-06 DIAGNOSIS — I491 Atrial premature depolarization: Secondary | ICD-10-CM

## 2017-04-06 DIAGNOSIS — R0600 Dyspnea, unspecified: Secondary | ICD-10-CM

## 2017-04-06 NOTE — Progress Notes (Signed)
Chief Complaint  Patient presents with  . New Patient (Initial Visit)    dyspnea, abnormal eKG   History of Present Illness: 83 yo male with history of aortic stenosis, HTN, Mitral valve prolapse, sleep apnea here today as a new consult for evaluation of aortic stenosis and abnormal EKG. He is referred by Dr. Fara Olden. He was having a colonoscopy on 04/04/17 and was found to have an irregular heart rhythm. He was sent to his primary care office where an EKG showed sinus rhythm with PACs. I have reviewed that EKG today. He has valvular heart disease. Echo November 2017 with normal LV systolic function, QUCL=51-98%, grade 1 diastolic dysfunction, functionally bicuspid aortic valve with moderate stenosis (mean gradient 21 mmHg, AVA 1.4cm2). There was mild mitral regurgitation.   He tells me today that he is very active. He has had more dyspnea and fatigue lately while playing tennis in the heat. He does not seem to have the energy to play a full game. No chest pain. No dyspnea at baseline. He has spinal stenosis and pain in his hips. He also notes more dyspnea when at his home in [place], [place]   Primary Care Physician: Lavone Orn, MD (Dr. Fara Olden)   Past Medical History:  Diagnosis Date  . BPH (benign prostatic hyperplasia)   . Chronic low back pain   . Depression   . Diverticulitis   . Elevated PSA   . Foot drop, right   . Foot drop, right   . Gait disorder 02/03/2015  . Hip pain   . History of colonoscopy   . HOH (hard of hearing)    hearing aid  . Hypertension   . Lumbar radiculopathy   . Mitral valve prolapse   . Onychomycosis   . Peptic ulcer disease   . Sepsis ([place]) 2005   e. coli-after prostate bx  . Sleep apnea   . Torn ACL     Past Surgical History:  Procedure Laterality Date  . CATARACT EXTRACTION Bilateral   . ESOPHAGOGASTRODUODENOSCOPY N/A 08/19/2015   Procedure: ESOPHAGOGASTRODUODENOSCOPY (EGD);  Surgeon: Laurence Spates, MD;  Location: [facility] ENDOSCOPY;   Service: Endoscopy;  Laterality: N/A;  . right knee replacement Right 1996  . SHOULDER SURGERY    . TONSILLECTOMY      Current Outpatient Prescriptions  Medication Sig Dispense Refill  . acetaZOLAMIDE (DIAMOX) 125 MG tablet Take 1 tablet (125 mg total) by mouth as needed (altitude changes). 60 tablet 0  . colchicine 0.6 MG tablet Take 1 tablet (0.6 mg total) by mouth daily. 10 tablet 2  . dutasteride (AVODART) 0.5 MG capsule Take 0.5 mg by mouth daily.  2  . esomeprazole (NEXIUM) 40 MG capsule Take 1 capsule (40 mg total) by mouth 2 (two) times daily before a meal. 60 capsule 0  . gabapentin (NEURONTIN) 300 MG capsule 1 pill nightly 90 capsule 3  . indapamide (LOZOL) 2.5 MG tablet Take 2.5 mg by mouth daily.  1  . mirtazapine (REMERON) 15 MG tablet Take 1 tablet (15 mg total) by mouth at bedtime. 90 tablet 1  . Omega-3 Fatty Acids (FISH OIL PO) Take 1,000 mg by mouth daily.    . Probiotic Product (PROBIOTIC DAILY PO) Take 1 tablet by mouth daily.    . tamsulosin (FLOMAX) 0.4 MG CAPS capsule Take 0.4 mg by mouth daily.  2  . Turmeric 500 MG CAPS Take 500 mg by mouth daily.    . verapamil (CALAN-SR) 240 MG CR tablet Take 240  mg by mouth daily.  2  . Vitamin D, Ergocalciferol, (DRISDOL) 50000 units CAPS capsule TAKE 1 CAPSULE WEEKLY. 12 capsule 0   No current facility-administered medications for this visit.     No Known Allergies  Social History   Social History  . Marital status: Married    Spouse name: N/A  . Number of children: 2  . Years of education: N/A   Occupational History  . Retired Social research officer, government    Social History Main Topics  . Smoking status: Never Smoker  . Smokeless tobacco: Never Used  . Alcohol use Not on file  . Drug use: No  . Sexual activity: Yes   Other Topics Concern  . Not on file   Social History Narrative   Patient is right handed.   Patient drinks 1 cup of coffee per day.    Family History  Problem Relation Age of Onset  . Pneumonia  Mother 26  . Cancer - Lung Father 9  . Multiple myeloma Brother 32    Review of Systems:  As stated in the HPI and otherwise negative.   BP 130/60   Pulse (!) 54   Ht '5\' 11"'$  (1.803 m)   Wt 208 lb 6.4 oz (94.5 kg)   SpO2 96%   BMI 29.07 kg/m   Physical Examination: General: Well developed, well nourished, NAD  HEENT: OP clear, mucus membranes moist  SKIN: warm, dry. No rashes. Neuro: No focal deficits  Musculoskeletal: Muscle strength 5/5 all ext  Psychiatric: Mood and affect normal  Neck: No JVD, no carotid bruits, no thyromegaly, no lymphadenopathy.  Lungs:Clear bilaterally, no wheezes, rhonci, crackles Cardiovascular: Regular rate with ectopy. Loud harsh systolic murmur.  Abdomen:Soft. Bowel sounds present. Non-tender.  Extremities: No lower extremity edema. Pulses are 2 + in the bilateral DP/PT.  Echo 09/15/16: Left ventricle: The cavity size was normal. Wall thickness was   increased in a pattern of mild LVH. Systolic function was   vigorous. The estimated ejection fraction was in the range of 65%   to 70%. Wall motion was normal; there were no regional wall   motion abnormalities. Doppler parameters are consistent with   abnormal left ventricular relaxation (grade 1 diastolic   dysfunction). - Aortic valve: Bicuspid; moderately calcified leaflets. There was   moderate stenosis. There was mild regurgitation. Mean gradient   (S): 21 mm Hg. Valve area (VTI): 1.41 cm^2. - Aorta: Mildly dilated aortic root. Aortic root dimension: 37 mm   (ED). - Mitral valve: Mildly to moderately calcified annulus. Posterior   leaflet prolapse. Eccentric mitral regurgitation, probably mild. - Left atrium: The atrium was mildly dilated. - Right atrium: The atrium was mildly dilated. - Tricuspid valve: Peak RV-RA gradient (S): 24 mm Hg. - Pulmonary arteries: PA peak pressure: 27 mm Hg (S). - Inferior vena cava: The vessel was normal in size. The   respirophasic diameter changes were in  the normal range (= 50%),   consistent with normal central venous pressure.  EKG:  EKG is ordered today. The ekg ordered today demonstrates sinus, rate 67 bpm. 1st degree AV block. PACs.   Recent Labs: No results found for requested labs within last 8760 hours.   Lipid Panel No results found for: CHOL, TRIG, HDL, CHOLHDL, VLDL, LDLCALC, LDLDIRECT   Wt Readings from Last 3 Encounters:  04/06/17 208 lb 6.4 oz (94.5 kg)  04/04/17 208 lb (94.3 kg)  02/21/17 200 lb 2 oz (90.8 kg)     Other studies Reviewed:  Additional studies/ records that were reviewed today include: . Review of the above records demonstrates:   Assessment and Plan:   1. Aortic stenosis: Moderate AS by echo November 2017. Will repeat echo now since he has had dyspnea and fatigue. I have reviewed the etiology of aortic stenosis today with the pt including treatment options and given him a TAVR handbook. He has no dizziness, near syncope or any worrisome symptoms.   2. Premature atrial contractions: No evidence of atrial fib. He has PACs. Will repeat echo to assess LV function.   3. Dyspnea: Will arrange Lexiscan stress test to exclude ischemia. His dyspnea may be due to his AS.   Current medicines are reviewed at length with the patient today.  The patient does not have concerns regarding medicines.  The following changes have been made:  no change  Labs/ tests ordered today include:   Orders Placed This Encounter  Procedures  . Myocardial Perfusion Imaging  . ECHOCARDIOGRAM COMPLETE     Disposition:   FU with me in 3 months   Signed, [name], MD 04/06/2017 3:18 PM    [facility] [address], [place], [place]  [address] Phone: [phone]; Fax: [phone]

## 2017-04-06 NOTE — Telephone Encounter (Signed)
Encounter complete. 

## 2017-04-06 NOTE — Patient Instructions (Signed)
Medication Instructions:  Your physician recommends that you continue on your current medications as directed. Please refer to the Current Medication list given to you today.   Labwork: none  Testing/Procedures: Your physician has requested that you have a lexiscan myoview. For further information please visit [email or web address]. Please follow instruction sheet, as given.  Your physician has requested that you have an echocardiogram. Echocardiography is a painless test that uses sound waves to create images of your heart. It provides your doctor with information about the size and shape of your heart and how well your heart's chambers and valves are working. This procedure takes approximately one hour. There are no restrictions for this procedure.    Follow-Up: Your physician recommends that you schedule a follow-up appointment in: 3 months.     Any Other Special Instructions Will Be Listed Below (If Applicable).     If you need a refill on your cardiac medications before your next appointment, please call your pharmacy.

## 2017-04-07 ENCOUNTER — Ambulatory Visit (HOSPITAL_COMMUNITY)
Admission: RE | Admit: 2017-04-07 | Discharge: 2017-04-07 | Disposition: A | Discharge status: Home / Self Care | Payer: Medicare Other | Source: Ambulatory Visit | Attending: Cardiovascular Disease | Admitting: Cardiovascular Disease

## 2017-04-07 ENCOUNTER — Ambulatory Visit (HOSPITAL_BASED_OUTPATIENT_CLINIC_OR_DEPARTMENT_OTHER)
Admission: RE | Admit: 2017-04-07 | Discharge: 2017-04-07 | Disposition: A | Discharge status: Home / Self Care | Payer: Medicare Other | Source: Ambulatory Visit | Attending: Cardiovascular Disease | Admitting: Cardiovascular Disease

## 2017-04-07 DIAGNOSIS — I491 Atrial premature depolarization: Secondary | ICD-10-CM

## 2017-04-07 DIAGNOSIS — I350 Nonrheumatic aortic (valve) stenosis: Secondary | ICD-10-CM | POA: Diagnosis not present

## 2017-04-07 DIAGNOSIS — R0600 Dyspnea, unspecified: Secondary | ICD-10-CM | POA: Diagnosis not present

## 2017-04-07 LAB — MYOCARDIAL PERFUSION IMAGING
CHL CUP NUCLEAR SDS: 1
CHL CUP RESTING HR STRESS: 66 {beats}/min
LV dias vol: 115 mL (ref 62–150)
LV sys vol: 45 mL
NUC STRESS TID: 1.03
Peak HR: 73 {beats}/min
Rest BP: 14177 mmHg
SRS: 6
SSS: 7

## 2017-04-07 LAB — ECHOCARDIOGRAM COMPLETE
HEIGHTINCHES: 71 in
WEIGHTICAEL: 3328 [oz_av]

## 2017-04-07 MED ORDER — REGADENOSON 0.4 MG/5ML IV SOLN
0.4000 mg | Freq: Once | INTRAVENOUS | Status: AC
  Administered 2017-04-07: 0.4 mg via INTRAVENOUS

## 2017-04-07 MED ORDER — TECHNETIUM TC 99M TETROFOSMIN IV KIT
10.2000 | PACK | Freq: Once | INTRAVENOUS | Status: AC | PRN
  Administered 2017-04-07: 10.2 via INTRAVENOUS
  Filled 2017-04-07: qty 11

## 2017-04-07 MED ORDER — TECHNETIUM TC 99M TETROFOSMIN IV KIT
31.2000 | PACK | Freq: Once | INTRAVENOUS | Status: AC | PRN
  Administered 2017-04-07: 31.2 via INTRAVENOUS
  Filled 2017-04-07: qty 32

## 2017-04-07 NOTE — Addendum Note (Signed)
Addended by: Mendel Ryder on: 04/07/2017 11:17 AM   Modules accepted: Orders

## 2017-04-07 NOTE — Progress Notes (Signed)
  Echocardiogram 2D Echocardiogram has been performed.  Patrick Franco 04/07/2017, 3:41 PM

## 2017-04-07 NOTE — Progress Notes (Signed)
  Echocardiogram 2D Echocardiogram has been performed.   T  04/07/2017, 3:40 PM

## 2017-04-11 DIAGNOSIS — K2930 Chronic superficial gastritis without bleeding: Secondary | ICD-10-CM | POA: Diagnosis not present

## 2017-06-16 ENCOUNTER — Encounter: Payer: Self-pay | Admitting: *Deleted

## 2017-06-19 ENCOUNTER — Other Ambulatory Visit: Payer: Self-pay | Admitting: Family Medicine

## 2017-06-19 NOTE — Telephone Encounter (Signed)
Refill done.  

## 2017-07-04 ENCOUNTER — Ambulatory Visit (INDEPENDENT_AMBULATORY_CARE_PROVIDER_SITE_OTHER): Payer: Medicare Other | Admitting: Family Medicine

## 2017-07-04 ENCOUNTER — Encounter: Payer: Self-pay | Admitting: Family Medicine

## 2017-07-04 VITALS — BP 110/80 | HR 80 | Wt 211.0 lb

## 2017-07-04 DIAGNOSIS — F3289 Other specified depressive episodes: Secondary | ICD-10-CM

## 2017-07-04 DIAGNOSIS — M48062 Spinal stenosis, lumbar region with neurogenic claudication: Secondary | ICD-10-CM

## 2017-07-04 MED ORDER — ALLOPURINOL 100 MG PO TABS: 100.0000 mg | ORAL_TABLET | Freq: Every day | ORAL | 3 refills | Status: DC

## 2017-07-04 MED ORDER — COLCHICINE 0.6 MG PO TABS: 0.6000 mg | ORAL_TABLET | Freq: Two times a day (BID) | ORAL | 2 refills | Status: DC | PRN

## 2017-07-04 MED ORDER — VITAMIN D (ERGOCALCIFEROL) 1.25 MG (50000 UNIT) PO CAPS: 50000.0000 [IU] | ORAL_CAPSULE | ORAL | 3 refills | Status: DC

## 2017-07-04 NOTE — Patient Instructions (Signed)
Good to see you  Glad you survived [place] and the heat We will do the injection again Refilled medication Give Art a call we sent in referral.  See me again 3 weeks after the epidural

## 2017-07-04 NOTE — Progress Notes (Signed)
[name] [address], [place] [address] Phone: [phone] Subjective:    I'm seeing this patient by the request  of:  Lavone Orn, MD   CC:low back pain follow-up  DJS:[id number]  Patrick Franco is a 83 y.o. male coming in with complaint of left hip pain. It appears the patient's pain was likely coming from his back and likely underlying spinal stenosis. X-rays that show moderate osteoarthritic changes. On gabapentin 100/100/39m. Patient was having worsening symptoms at last follow-up. Elected to get MRI of the lumbar spine. MRI did show that patient did have moderate to severe foraminal narrowing and right bone forearm and left L5 seeming to be the worse. Patient also has moderate spinal stenosis. Patient minimally tender in the left L3-L4 epidural injection. Patient was doing significantly better. Patient states and is unfortunately having increasing radicular pain and weakness of the lower extremities again. Feels that he doesn't need another possible epidural.  Patient was having depressive symptoms. Feels the Remeron has helped but now is plateauing. Denies any worsening symptoms. Patient does not think he is improving as much as he did initially. Does not want to change in medications. Denies any suicidal or homicidal ideation..    Past Medical History:  Diagnosis Date  . BPH (benign prostatic hyperplasia)   . Chronic low back pain   . Depression   . Diverticulitis   . Elevated PSA   . Foot drop, right   . Foot drop, right   . Gait disorder 02/03/2015  . Hip pain   . History of colonoscopy   . HOH (hard of hearing)    hearing aid  . Hypertension   . Lumbar radiculopathy   . Mitral valve prolapse   . Onychomycosis   . Peptic ulcer disease   . [place]   e. coli-after prostate bx  . Sleep apnea   . Torn ACL    Past Surgical History:  Procedure Laterality Date  . CATARACT EXTRACTION Bilateral   .  ESOPHAGOGASTRODUODENOSCOPY N/A 08/19/2015   Procedure: ESOPHAGOGASTRODUODENOSCOPY (EGD);  Surgeon: JLaurence Spates MD;  [facility];  Service: Endoscopy;  Laterality: N/A;  . right knee replacement Right 1996  . SHOULDER SURGERY    . TONSILLECTOMY     Social History  Substance Use Topics  . Smoking status: Never Smoker  . Smokeless tobacco: Never Used  . Alcohol use Not on file   No Known Allergies Family History  Problem Relation Age of Onset  . Pneumonia Mother 819 . Cancer - Lung Father 56 . Multiple myeloma Brother 553      Past Medical History:  Diagnosis Date  . BPH (benign prostatic hyperplasia)   . Chronic low back pain   . Depression   . Diverticulitis   . Elevated PSA   . Foot drop, right   . Foot drop, right   . Gait disorder 02/03/2015  . Hip pain   . History of colonoscopy   . HOH (hard of hearing)    hearing aid  . Hypertension   . Lumbar radiculopathy   . Mitral valve prolapse   . Onychomycosis   . Peptic ulcer disease   . [place]   e. coli-after prostate bx  . Sleep apnea   . Torn ACL    Past Surgical History:  Procedure Laterality Date  . CATARACT EXTRACTION Bilateral   . ESOPHAGOGASTRODUODENOSCOPY N/A 08/19/2015   Procedure: ESOPHAGOGASTRODUODENOSCOPY (EGD);  Surgeon: Laurence Spates, MD;  Location: [facility] ENDOSCOPY;  Service: Endoscopy;  Laterality: N/A;  . right knee replacement Right 1996  . SHOULDER SURGERY    . TONSILLECTOMY     Social History  Substance Use Topics  . Smoking status: Never Smoker  . Smokeless tobacco: Never Used  . Alcohol use Not on file   No Known Allergies Family History  Problem Relation Age of Onset  . Pneumonia Mother 66  . Cancer - Lung Father 26  . Multiple myeloma Brother 54    Past medical history, social, surgical and family history all reviewed in electronic medical record.   Review of Systems: No headache, visual changes, nausea, vomiting, diarrhea, constipation, dizziness,  abdominal pain, skin rash, fevers, chills, night sweats, weight loss, swollen lymph nodes, body aches, joint swelling, chest pain, shortness of breath, mood changes.  Positive muscle aches  Objective  Blood pressure 110/80, pulse 80, weight 211 lb (95.7 kg).  Systems examined below as of 07/04/17 General: NAD A&O x3 mood, affect normal  HEENT: Pupils equal, extraocular movements intact no nystagmus Respiratory: not short of breath at rest or with speaking Cardiovascular: No lower extremity edema, non tender Skin: Warm dry intact with no signs of infection or rash on extremities or on axial skeleton. Abdomen: Soft nontender, no masses Neuro: Cranial nerves  intact, neurovascularly intact in all extremities with 2+ DTRs and 2+ pulses. Lymph: No lymphadenopathy appreciated today  Gait improved gait more steady than previous exam MSK:  Non tender with full range of motion and good stability and symmetric strength and tone of shoulders, elbows, wrist, , and ankles bilaterally. Arthritic changes of multiple joints   Back Exam:  Inspection:Lumbar scoliosis noted. Motion: Flexion 25 deg, Extension 15 deg side bending to 20 bilaterally Rotation to 25 deg bilaterally no change in range of motion SLR laying: Positive on the left sign XSLR laying: Negative  Nontender on exam FABER: Unable to do secondary to pain. Sensory change: Gross sensation intact to all lumbar and sacral dermatomes.  Reflexes: 2+ at both patellar tendons, 2+ at achilles tendons, Babinski's downgoing. Strength at foot  4 out of 5 strength which is mildly worse than previous exam    Impression and Recommendations:     This case required medical decision making of moderate complexity.

## 2017-07-04 NOTE — Assessment & Plan Note (Signed)
Patient is having worsening symptoms again. We will get an epidural but I think will be beneficial. We discussed icing regimen, we discussed home exercises, we discussed which activities doing which ones to avoid. Patient will follow-up again in 2 weeks after epidural to make sure patient responds. Otherwise patient will need to consider evaluation by neurosurgery.

## 2017-07-04 NOTE — Assessment & Plan Note (Addendum)
Patient has no suicidal or homicidal ideation. Patient is a long again and is unable to talk to his family to know how he is doing overall. Still has a depressed mood. Discussed the possibility of increasing Remeron which patient declined.

## 2017-07-05 ENCOUNTER — Ambulatory Visit (INDEPENDENT_AMBULATORY_CARE_PROVIDER_SITE_OTHER): Payer: Medicare Other | Admitting: Cardiovascular Disease

## 2017-07-05 ENCOUNTER — Encounter: Payer: Self-pay | Admitting: Cardiovascular Disease

## 2017-07-05 VITALS — BP 124/80 | HR 80 | Ht 71.0 in | Wt 208.1 lb

## 2017-07-05 DIAGNOSIS — R0600 Dyspnea, unspecified: Secondary | ICD-10-CM | POA: Diagnosis not present

## 2017-07-05 DIAGNOSIS — I350 Nonrheumatic aortic (valve) stenosis: Secondary | ICD-10-CM

## 2017-07-05 DIAGNOSIS — I491 Atrial premature depolarization: Secondary | ICD-10-CM | POA: Diagnosis not present

## 2017-07-05 NOTE — Progress Notes (Signed)
Chief Complaint  Patient presents with  . Follow-up    Aortic stenosis   History of Present Illness: 83 yo male with history of aortic stenosis, HTN, mitral valve prolapse, sleep apnea here today for cardiac follow up. I saw him as a new consult for evaluation of aortic stenosis and abnormal EKG in May 2018. He was having a colonoscopy on 04/04/17 and was found to have an irregular heart rhythm. He was sent to his primary care office where an EKG showed sinus rhythm with PACs. Echo November 2017 with normal LV systolic function, YKZL=93-57%, grade 1 diastolic dysfunction, functionally bicuspid aortic valve with moderate stenosis (mean gradient 21 mmHg, AVA 1.4cm2). There was mild mitral regurgitation. At his first visit here in May 2018, he described dyspnea and less energy but no chest pain or dizziness. Echo May 2018 showed normal LV systolic function, mild aortic valve stenosis (mean gradient 14 mmHg) and mild mitral valve regurgitation with MVP. Nuclear stress test May 2018 with no ischemia.   He is here today for follow up. The patient denies any chest pain, palpitations, lower extremity edema, orthopnea, PND, dizziness, near syncope or syncope. He has no change in chronic dyspnea while here in [place]. He does have dyspnea when at 10,000 feet at his home in [place].    Primary Care Physician: Lavone Orn, MD (Dr. Fara Olden)   Past Medical History:  Diagnosis Date  . Aortic stenosis   . BPH (benign prostatic hyperplasia)   . Chronic low back pain   . Depression   . Diverticulitis   . Elevated PSA   . Foot drop, right   . Foot drop, right   . Gait disorder 02/03/2015  . Hip pain   . History of colonoscopy   . HOH (hard of hearing)    hearing aid  . Hypertension   . Lumbar radiculopathy   . Mitral valve prolapse   . Onychomycosis   . Peptic ulcer disease   . Sepsis ([place]) 2005   e. coli-after prostate bx  . Sleep apnea   . Torn ACL     Past Surgical History:    Procedure Laterality Date  . CATARACT EXTRACTION Bilateral   . ESOPHAGOGASTRODUODENOSCOPY N/A 08/19/2015   Procedure: ESOPHAGOGASTRODUODENOSCOPY (EGD);  Surgeon: Laurence Spates, MD;  [facility] END[facility];  Service: Endoscopy;  Laterality: N/A;  . right knee replacement Right 1996  . SHOULDER SURGERY    . TONSILLECTOMY      Current Outpatient Prescriptions  Medication Sig Dispense Refill  . acetaZOLAMIDE (DIAMOX) 125 MG tablet Take 1 tablet (125 mg total) by mouth as needed (altitude changes). 60 tablet 0  . allopurinol (ZYLOPRIM) 100 MG tablet Take 1 tablet (100 mg total) by mouth daily. 90 tablet 3  . colchicine 0.6 MG tablet Take 1 tablet (0.6 mg total) by mouth 2 (two) times daily as needed. 30 tablet 2  . dutasteride (AVODART) 0.5 MG capsule Take 0.5 mg by mouth daily.  2  . esomeprazole (NEXIUM) 40 MG capsule Take 1 capsule (40 mg total) by mouth 2 (two) times daily before a meal. 60 capsule 0  . gabapentin (NEURONTIN) 300 MG capsule TAKE 1 CAPSULE AT BEDTIME. 90 capsule 1  . indapamide (LOZOL) 2.5 MG tablet Take 2.5 mg by mouth daily.  1  . mirtazapine (REMERON) 15 MG tablet Take 1 tablet (15 mg total) by mouth at bedtime. 90 tablet 1  . Omega-3 Fatty Acids (FISH OIL PO) Take 1,000 mg by mouth daily.    [name]  Probiotic Product (PROBIOTIC DAILY PO) Take 1 tablet by mouth daily.    . tamsulosin (FLOMAX) 0.4 MG CAPS capsule Take 0.4 mg by mouth daily.  2  . Turmeric 500 MG CAPS Take 500 mg by mouth daily.    . verapamil (CALAN-SR) 240 MG CR tablet Take 240 mg by mouth daily.  2  . Vitamin D, Ergocalciferol, (DRISDOL) 50000 units CAPS capsule Take 1 capsule (50,000 Units total) by mouth once a week. 12 capsule 3   No current facility-administered medications for this visit.     No Known Allergies  Social History   Social History  . Marital status: Married    Spouse name: N/A  . Number of children: 2  . Years of education: N/A   Occupational History  . Retired Social research officer, government     Social History Main Topics  . Smoking status: Never Smoker  . Smokeless tobacco: Never Used  . Alcohol use Not on file  . Drug use: No  . Sexual activity: Yes   Other Topics Concern  . Not on file   Social History Narrative   Patient is right handed.   Patient drinks 1 cup of coffee per day.    Family History  Problem Relation Age of Onset  . Pneumonia Mother 44  . Cancer - Lung Father 29  . Multiple myeloma Brother 60    Review of Systems:  As stated in the HPI and otherwise negative.   BP 124/80   Pulse 80   Ht 5' 11"  (1.803 m)   Wt 208 lb 1.9 oz (94.4 kg)   SpO2 93%   BMI 29.03 kg/m   Physical Examination:  General: Well developed, well nourished, NAD  HEENT: OP clear, mucus membranes moist  SKIN: warm, dry. No rashes. Neuro: No focal deficits  Musculoskeletal: Muscle strength 5/5 all ext  Psychiatric: Mood and affect normal  Neck: No JVD, no carotid bruits, no thyromegaly, no lymphadenopathy.  Lungs:Clear bilaterally, no wheezes, rhonci, crackles Cardiovascular: Regular rate and rhythm. Harsh systolic murmur. No gallops or rubs. Abdomen:Soft. Bowel sounds present. Non-tender.  Extremities: No lower extremity edema. Pulses are 2 + in the bilateral DP/PT.  Echo May 2018: Left ventricle: The cavity size was normal. Wall thickness was   increased in a pattern of moderate LVH. Systolic function was   vigorous. The estimated ejection fraction was in the range of 65%   to 70%. Wall motion was normal; there were no regional wall   motion abnormalities. Doppler parameters are consistent with   abnormal left ventricular relaxation (grade 1 diastolic   dysfunction). - Aortic valve: There was mild stenosis. There was mild   regurgitation. - Mitral valve: Calcified annulus. Moderate prolapse, involving the   anterior leaflet and the posterior leaflet. There was mild   regurgitation. - Left atrium: The atrium was mildly dilated.  Impressions:  - Vigorous LV  systolic function; moderate LVH; mild diastolic   dysfunction; mild AS by doppler (mean gradient 14 mmHg); mild AI;   bileaflet MVP with mild MR; mild LAE.  EKG:  EKG is  ordered today. The ekg ordered today demonstrates sinus, 1st degree AV block. PACs.    Recent Labs: No results found for requested labs within last 8760 hours.   Lipid Panel No results found for: CHOL, TRIG, HDL, CHOLHDL, VLDL, LDLCALC, LDLDIRECT   Wt Readings from Last 3 Encounters:  07/05/17 208 lb 1.9 oz (94.4 kg)  07/04/17 211 lb (95.7 kg)  04/07/17 208 lb (  94.3 kg)     Other studies Reviewed: Additional studies/ records that were reviewed today include: . Review of the above records demonstrates:   Assessment and Plan:   1. Aortic stenosis: Mild to moderate by echo May 2018 (mean gradient 14 mmHg). Repeat echo one year.    2. Premature atrial contractions: He has no palpitations. Exam today with ectopy. EKG with PACs. Continue verapamil.    3. Dyspnea: no ischemia on stress test May 2018. Volume status is ok. Some of his dyspnea at peak exertion is likely due to his aortic stenosis which appears to be moderate.   Current medicines are reviewed at length with the patient today.  The patient does not have concerns regarding medicines.  The following changes have been made:  no change  Labs/ tests ordered today include:   Orders Placed This Encounter  Procedures  . EKG 12-Lead  . ECHOCARDIOGRAM COMPLETE     Disposition:   FU with me in 12 months   Signed, [name], MD 07/05/2017 9:49 AM    [facility] [address], [place], [place]  [address] Phone: [phone]; Fax: [phone]

## 2017-07-05 NOTE — Patient Instructions (Signed)
Medication Instructions:  Your physician recommends that you continue on your current medications as directed. Please refer to the Current Medication list given to you today.   Labwork: none  Testing/Procedures: Your physician has requested that you have an echocardiogram. Echocardiography is a painless test that uses sound waves to create images of your heart. It provides your doctor with information about the size and shape of your heart and how well your heart's chambers and valves are working. This procedure takes approximately one hour. There are no restrictions for this procedure.  To be done in 12 months.   Follow-Up: Your physician recommends that you schedule a follow-up appointment in: 12 months.  (week or 2 after echo).  Please call our office in about 9 months to schedule this appointment.     Any Other Special Instructions Will Be Listed Below (If Applicable).     If you need a refill on your cardiac medications before your next appointment, please call your pharmacy.

## 2017-07-11 DIAGNOSIS — M5416 Radiculopathy, lumbar region: Secondary | ICD-10-CM | POA: Diagnosis not present

## 2017-07-11 DIAGNOSIS — M1712 Unilateral primary osteoarthritis, left knee: Secondary | ICD-10-CM | POA: Diagnosis not present

## 2017-07-11 DIAGNOSIS — M545 Low back pain: Secondary | ICD-10-CM | POA: Diagnosis not present

## 2017-07-11 DIAGNOSIS — M419 Scoliosis, unspecified: Secondary | ICD-10-CM | POA: Diagnosis not present

## 2017-07-18 ENCOUNTER — Ambulatory Visit
Admission: RE | Admit: 2017-07-18 | Discharge: 2017-07-18 | Disposition: A | Discharge status: Home / Self Care | Payer: Medicare Other | Source: Ambulatory Visit | Attending: Family Medicine | Admitting: Family Medicine

## 2017-07-18 DIAGNOSIS — M48062 Spinal stenosis, lumbar region with neurogenic claudication: Secondary | ICD-10-CM

## 2017-07-18 DIAGNOSIS — M545 Low back pain: Secondary | ICD-10-CM | POA: Diagnosis not present

## 2017-07-18 MED ORDER — IOPAMIDOL (ISOVUE-M 200) INJECTION 41%
1.0000 mL | Freq: Once | INTRAMUSCULAR | Status: AC
  Administered 2017-07-18: 1 mL via EPIDURAL

## 2017-07-18 MED ORDER — METHYLPREDNISOLONE ACETATE 40 MG/ML INJ SUSP (RADIOLOG
120.0000 mg | Freq: Once | INTRAMUSCULAR | Status: AC
  Administered 2017-07-18: 120 mg via EPIDURAL

## 2017-07-18 NOTE — Discharge Instructions (Signed)

## 2017-07-20 DIAGNOSIS — M1712 Unilateral primary osteoarthritis, left knee: Secondary | ICD-10-CM | POA: Diagnosis not present

## 2017-07-20 DIAGNOSIS — M545 Low back pain: Secondary | ICD-10-CM | POA: Diagnosis not present

## 2017-07-20 DIAGNOSIS — M419 Scoliosis, unspecified: Secondary | ICD-10-CM | POA: Diagnosis not present

## 2017-07-20 DIAGNOSIS — M5416 Radiculopathy, lumbar region: Secondary | ICD-10-CM | POA: Diagnosis not present

## 2017-08-08 ENCOUNTER — Other Ambulatory Visit: Payer: Self-pay | Admitting: Internal Medicine

## 2017-08-08 ENCOUNTER — Ambulatory Visit
Admission: RE | Admit: 2017-08-08 | Discharge: 2017-08-08 | Disposition: A | Discharge status: Home / Self Care | Payer: Medicare Other | Source: Ambulatory Visit | Attending: Internal Medicine | Admitting: Internal Medicine

## 2017-08-08 DIAGNOSIS — R0609 Other forms of dyspnea: Secondary | ICD-10-CM | POA: Diagnosis not present

## 2017-08-08 DIAGNOSIS — Z23 Encounter for immunization: Secondary | ICD-10-CM | POA: Diagnosis not present

## 2017-08-08 DIAGNOSIS — N401 Enlarged prostate with lower urinary tract symptoms: Secondary | ICD-10-CM | POA: Diagnosis not present

## 2017-08-08 DIAGNOSIS — R0602 Shortness of breath: Secondary | ICD-10-CM | POA: Diagnosis not present

## 2017-08-08 DIAGNOSIS — I10 Essential (primary) hypertension: Secondary | ICD-10-CM | POA: Diagnosis not present

## 2017-08-10 DIAGNOSIS — M419 Scoliosis, unspecified: Secondary | ICD-10-CM | POA: Diagnosis not present

## 2017-08-10 DIAGNOSIS — M1712 Unilateral primary osteoarthritis, left knee: Secondary | ICD-10-CM | POA: Diagnosis not present

## 2017-08-10 DIAGNOSIS — M545 Low back pain: Secondary | ICD-10-CM | POA: Diagnosis not present

## 2017-08-10 DIAGNOSIS — M5416 Radiculopathy, lumbar region: Secondary | ICD-10-CM | POA: Diagnosis not present

## 2017-08-18 DIAGNOSIS — M419 Scoliosis, unspecified: Secondary | ICD-10-CM | POA: Diagnosis not present

## 2017-08-18 DIAGNOSIS — M545 Low back pain: Secondary | ICD-10-CM | POA: Diagnosis not present

## 2017-08-18 DIAGNOSIS — M5416 Radiculopathy, lumbar region: Secondary | ICD-10-CM | POA: Diagnosis not present

## 2017-08-18 DIAGNOSIS — M1712 Unilateral primary osteoarthritis, left knee: Secondary | ICD-10-CM | POA: Diagnosis not present

## 2017-09-01 DIAGNOSIS — M419 Scoliosis, unspecified: Secondary | ICD-10-CM | POA: Diagnosis not present

## 2017-09-01 DIAGNOSIS — M545 Low back pain: Secondary | ICD-10-CM | POA: Diagnosis not present

## 2017-09-01 DIAGNOSIS — M1712 Unilateral primary osteoarthritis, left knee: Secondary | ICD-10-CM | POA: Diagnosis not present

## 2017-09-01 DIAGNOSIS — M5416 Radiculopathy, lumbar region: Secondary | ICD-10-CM | POA: Diagnosis not present

## 2017-09-08 DIAGNOSIS — N401 Enlarged prostate with lower urinary tract symptoms: Secondary | ICD-10-CM | POA: Diagnosis not present

## 2017-09-08 DIAGNOSIS — F329 Major depressive disorder, single episode, unspecified: Secondary | ICD-10-CM | POA: Diagnosis not present

## 2017-09-08 DIAGNOSIS — I480 Paroxysmal atrial fibrillation: Secondary | ICD-10-CM | POA: Diagnosis not present

## 2017-09-08 DIAGNOSIS — I10 Essential (primary) hypertension: Secondary | ICD-10-CM | POA: Diagnosis not present

## 2017-09-15 DIAGNOSIS — M419 Scoliosis, unspecified: Secondary | ICD-10-CM | POA: Diagnosis not present

## 2017-09-15 DIAGNOSIS — M1712 Unilateral primary osteoarthritis, left knee: Secondary | ICD-10-CM | POA: Diagnosis not present

## 2017-09-15 DIAGNOSIS — M545 Low back pain: Secondary | ICD-10-CM | POA: Diagnosis not present

## 2017-09-15 DIAGNOSIS — M5416 Radiculopathy, lumbar region: Secondary | ICD-10-CM | POA: Diagnosis not present

## 2017-09-18 DIAGNOSIS — H5203 Hypermetropia, bilateral: Secondary | ICD-10-CM | POA: Diagnosis not present

## 2017-09-18 DIAGNOSIS — H524 Presbyopia: Secondary | ICD-10-CM | POA: Diagnosis not present

## 2017-09-18 DIAGNOSIS — H35033 Hypertensive retinopathy, bilateral: Secondary | ICD-10-CM | POA: Diagnosis not present

## 2017-09-18 DIAGNOSIS — Z9849 Cataract extraction status, unspecified eye: Secondary | ICD-10-CM | POA: Diagnosis not present

## 2017-09-18 DIAGNOSIS — I10 Essential (primary) hypertension: Secondary | ICD-10-CM | POA: Diagnosis not present

## 2017-09-18 DIAGNOSIS — H52223 Regular astigmatism, bilateral: Secondary | ICD-10-CM | POA: Diagnosis not present

## 2017-09-22 DIAGNOSIS — M5416 Radiculopathy, lumbar region: Secondary | ICD-10-CM | POA: Diagnosis not present

## 2017-09-22 DIAGNOSIS — M1712 Unilateral primary osteoarthritis, left knee: Secondary | ICD-10-CM | POA: Diagnosis not present

## 2017-09-22 DIAGNOSIS — M419 Scoliosis, unspecified: Secondary | ICD-10-CM | POA: Diagnosis not present

## 2017-09-22 DIAGNOSIS — M545 Low back pain: Secondary | ICD-10-CM | POA: Diagnosis not present

## 2017-09-28 DIAGNOSIS — M5416 Radiculopathy, lumbar region: Secondary | ICD-10-CM | POA: Diagnosis not present

## 2017-09-28 DIAGNOSIS — M1712 Unilateral primary osteoarthritis, left knee: Secondary | ICD-10-CM | POA: Diagnosis not present

## 2017-09-28 DIAGNOSIS — M419 Scoliosis, unspecified: Secondary | ICD-10-CM | POA: Diagnosis not present

## 2017-09-28 DIAGNOSIS — M545 Low back pain: Secondary | ICD-10-CM | POA: Diagnosis not present

## 2017-10-10 DIAGNOSIS — L570 Actinic keratosis: Secondary | ICD-10-CM | POA: Diagnosis not present

## 2017-10-10 DIAGNOSIS — L812 Freckles: Secondary | ICD-10-CM | POA: Diagnosis not present

## 2017-10-10 DIAGNOSIS — Z85828 Personal history of other malignant neoplasm of skin: Secondary | ICD-10-CM | POA: Diagnosis not present

## 2017-10-10 DIAGNOSIS — L821 Other seborrheic keratosis: Secondary | ICD-10-CM | POA: Diagnosis not present

## 2017-10-10 DIAGNOSIS — D1801 Hemangioma of skin and subcutaneous tissue: Secondary | ICD-10-CM | POA: Diagnosis not present

## 2017-10-10 DIAGNOSIS — M545 Low back pain: Secondary | ICD-10-CM | POA: Diagnosis not present

## 2017-10-10 DIAGNOSIS — L720 Epidermal cyst: Secondary | ICD-10-CM | POA: Diagnosis not present

## 2017-10-10 DIAGNOSIS — M419 Scoliosis, unspecified: Secondary | ICD-10-CM | POA: Diagnosis not present

## 2017-10-10 DIAGNOSIS — M5416 Radiculopathy, lumbar region: Secondary | ICD-10-CM | POA: Diagnosis not present

## 2017-10-10 DIAGNOSIS — M1712 Unilateral primary osteoarthritis, left knee: Secondary | ICD-10-CM | POA: Diagnosis not present

## 2017-10-16 ENCOUNTER — Encounter: Payer: Self-pay | Admitting: Family Medicine

## 2017-10-16 ENCOUNTER — Ambulatory Visit (INDEPENDENT_AMBULATORY_CARE_PROVIDER_SITE_OTHER): Payer: Medicare Other | Admitting: Family Medicine

## 2017-10-16 DIAGNOSIS — F3289 Other specified depressive episodes: Secondary | ICD-10-CM

## 2017-10-16 DIAGNOSIS — M48062 Spinal stenosis, lumbar region with neurogenic claudication: Secondary | ICD-10-CM

## 2017-10-16 MED ORDER — MIRTAZAPINE 30 MG PO TABS: 30.0000 mg | ORAL_TABLET | Freq: Every day | ORAL | 1 refills | Status: DC

## 2017-10-16 MED ORDER — GABAPENTIN 400 MG PO CAPS: 400.0000 mg | ORAL_CAPSULE | Freq: Every day | ORAL | 3 refills | Status: DC

## 2017-10-16 NOTE — Assessment & Plan Note (Signed)
Increase Remeron to 30 mg.

## 2017-10-16 NOTE — Progress Notes (Signed)
[facility] [place], Patrick Franco: [phone] Subjective:    I'm seeing this patient by the request  of:    CC: Back pain follow-up  GBE:[id number]  Patrick Franco is a 83 y.o. male coming in with complaint of back pain.  Was found to have spinal stenosis that was confirmed on MRI.  Patient attempted an epidural and was 100% better.  This was in February 2018.  Since then has had 2 other epidurals with the last one being in July 18, 2017.  Patient was having worsening symptoms and started on Remeron also secondary to difficulty with sleep.  Patient states he is having more back pain again.  Describes the pain as a dull, throbbing aching sensation that is waking him up at night.  Patient has already canceled 2 different medications for next year secondary to the amount of walking he thinks would be causing increasing discomfort of the back.  States that he has to walk 200 feet and then stop again secondary to the pain in his legs.       Past Medical History:  Diagnosis Date  . Aortic stenosis   . BPH (benign prostatic hyperplasia)   . Chronic low back pain   . Depression   . Diverticulitis   . Elevated PSA   . Foot drop, right   . Foot drop, right   . Gait disorder 02/03/2015  . Hip pain   . History of colonoscopy   . HOH (hard of hearing)    hearing aid  . Hypertension   . Lumbar radiculopathy   . Mitral valve prolapse   . Onychomycosis   . Peptic ulcer disease   . Sepsis ([place]) 2005   e. coli-after prostate bx  . Sleep apnea   . Torn ACL    Past Surgical History:  Procedure Laterality Date  . CATARACT EXTRACTION Bilateral   . ESOPHAGOGASTRODUODENOSCOPY N/A 08/19/2015   Procedure: ESOPHAGOGASTRODUODENOSCOPY (EGD);  Surgeon: Laurence Spates, MD;  [facility] ENDOSCOPY;  Service: Endoscopy;  Laterality: N/A;  . right knee replacement Right 1996  . SHOULDER SURGERY    . TONSILLECTOMY     Social History    Socioeconomic History  . Marital status: Married    Spouse name: None  . Number of children: 2  . Years of education: None  . Highest education level: None  Social Needs  . Financial resource strain: None  . Food insecurity - worry: None  . Food insecurity - inability: None  . Transportation needs - medical: None  . Transportation needs - non-medical: None  Occupational History  . Occupation: Retired Social research officer, government  Tobacco Use  . Smoking status: Never Smoker  . Smokeless tobacco: Never Used  Substance and Sexual Activity  . Alcohol use: None  . Drug use: No  . Sexual activity: Yes  Other Topics Concern  . None  Social History Narrative   Patient is right handed.   Patient drinks 1 cup of coffee per day.   No Known Allergies Family History  Problem Relation Age of Onset  . Pneumonia Mother 55  . Cancer - Lung Father 39  . Multiple myeloma Brother 22     Past medical history, social, surgical and family history all reviewed in electronic medical record.  No pertanent information unless stated regarding to the chief complaint.   Review of Systems:Review of systems updated and as accurate as of 10/16/17  No headache, visual changes,  nausea, vomiting, diarrhea, constipation, dizziness, abdominal pain, skin rash, fevers, chills, night sweats, weight loss, swollen lymph nodes, body aches, joint swelling,  chest pain, shortness of breath,. Positive muscle aches and mood changes including depression denies suicidal or homicidal ideation  Objective  Blood pressure 120/74, pulse (!) 48, height 5' 10"  (1.778 m), weight 210 lb (95.3 kg), SpO2 93 %. Systems examined below as of 10/16/17   General: No apparent distress alert and oriented x3 mood and affect normal, dressed appropriately.  HEENT: Pupils equal, extraocular movements intact  Respiratory: Patient's speak in full sentences and does not appear short of breath  Cardiovascular: No lower extremity edema, non tender, no  erythema  Skin: Warm dry intact with no signs of infection or rash on extremities or on axial skeleton.  Abdomen: Soft nontender  Neuro: Cranial nerves II through XII are intact, neurovascularly intact in all extremities with 2+ DTRs and 2+ pulses.  Lymph: No lymphadenopathy of posterior or anterior cervical chain or axillae bilaterally.  Gait normal with good balance and coordination.  MSK:  Non tender with full range of motion and good stability and symmetric strength and tone of shoulders, elbows, wrist, hip, knee and ankles bilaterally.  Arthritic changes of multiple joints  Back Exam:  Inspection: Loss of lordosis with some degenerative scoliosis Motion: Flexion 25 deg, Extension 5 deg, Side Bending to 35 deg bilaterally,  Rotation to 35 deg bilaterally  SLR laying: Positive left greater than right XSLR laying: Negative  Palpable tenderness: Tender to palpation shows tightness bilaterally.[name] FABER: Positive bilateral. Sensory change: Gross sensation intact to all lumbar and sacral dermatomes.  Reflexes: 2+ at both patellar tendons, 2+ at achilles tendons, Babinski's downgoing.  Strength at foot  4 out of 5 but symmetric    Impression and Recommendations:     This case required medical decision making of moderate complexity.      Note: This dictation was prepared with Dragon dictation along with smaller phrase technology. Any transcriptional errors that result from this process are unintentional.

## 2017-10-16 NOTE — Assessment & Plan Note (Signed)
Worsening symptoms again.  Increase gabapentin to 400 mg.  We discussed icing regimen and home exercises, we discussed which activities to do which wants to avoid.  Patient will watch for any significant weakness.  We will try another epidural again.  If worsening symptoms needs to seek medical attention.  Patient also should probably see neurosurgery in the future if continues.

## 2017-10-16 NOTE — Patient Instructions (Addendum)
Good to see you  Ice is your friend We will get another epidural  Gabapentin now 400mg   Remeron 30mg  at night  See me again in 4 weeks

## 2017-10-17 DIAGNOSIS — M419 Scoliosis, unspecified: Secondary | ICD-10-CM | POA: Diagnosis not present

## 2017-10-17 DIAGNOSIS — M545 Low back pain: Secondary | ICD-10-CM | POA: Diagnosis not present

## 2017-10-17 DIAGNOSIS — M5416 Radiculopathy, lumbar region: Secondary | ICD-10-CM | POA: Diagnosis not present

## 2017-10-17 DIAGNOSIS — M1712 Unilateral primary osteoarthritis, left knee: Secondary | ICD-10-CM | POA: Diagnosis not present

## 2017-10-18 DIAGNOSIS — R0609 Other forms of dyspnea: Secondary | ICD-10-CM | POA: Diagnosis not present

## 2017-11-15 ENCOUNTER — Telehealth: Payer: Self-pay | Admitting: Family Medicine

## 2017-11-15 ENCOUNTER — Ambulatory Visit: Payer: Medicare Other | Admitting: Family Medicine

## 2017-11-15 DIAGNOSIS — M48062 Spinal stenosis, lumbar region with neurogenic claudication: Secondary | ICD-10-CM

## 2017-11-15 NOTE — Telephone Encounter (Signed)
Copied from [place] [id number]. Topic: Inquiry >> Nov 15, 2017  3:03 PM Patrice Paradise wrote: Reason for CRM: Patient stated that his last visit with Dr. Tamala Julian they discussed getting a epidural injection at [place]. Patient is requesting a call back from Dr. Tamala Julian or his assistant asap.  [phone] ([phone] (M)

## 2017-11-15 NOTE — Telephone Encounter (Signed)
Spoke with pt, ordered epidural.

## 2017-11-15 NOTE — Telephone Encounter (Signed)
Copied from [place] [id number]. Topic: Inquiry >> Nov 15, 2017  3:03 PM Patrice Paradise wrote: Reason for CRM: Patient stated that his last visit with Dr. Tamala Julian they discussed getting a epidural injection at [place]. Patient is requesting a call back from Dr. Tamala Julian or his assistant asap.  [phone] (H) [phone] (M)

## 2017-11-15 NOTE — Progress Notes (Deleted)
[name] [place], [place] [address] Phone: [phone] Subjective:    I'm seeing this patient by the request  of:    CC: Back pain follow-up  XID:[id number]  Patrick Franco is a 84 y.o. male coming in with complaint of back pain.  Has been seen before and is diagnosed with severe spinal stenosis confirmed on MRI.  Patient had responded well last year to an epidural.  Increase gabapentin to 400 mg.  Started on Remeron at 30 mg at night.  Patient was to consider another epidural which he did not do.  Patient states     Past Medical History:  Diagnosis Date  . Aortic stenosis   . BPH (benign prostatic hyperplasia)   . Chronic low back pain   . Depression   . Diverticulitis   . Elevated PSA   . Foot drop, right   . Foot drop, right   . Gait disorder 02/03/2015  . Hip pain   . History of colonoscopy   . HOH (hard of hearing)    hearing aid  . Hypertension   . Lumbar radiculopathy   . Mitral valve prolapse   . Onychomycosis   . Peptic ulcer disease   . Sepsis ([place]) 2005   e. coli-after prostate bx  . Sleep apnea   . Torn ACL    Past Surgical History:  Procedure Laterality Date  . CATARACT EXTRACTION Bilateral   . ESOPHAGOGASTRODUODENOSCOPY N/A 08/19/2015   Procedure: ESOPHAGOGASTRODUODENOSCOPY (EGD);  Surgeon: Laurence Spates, MD;  Location: [facility] ENDOSCOPY;  Service: Endoscopy;  Laterality: N/A;  . right knee replacement Right 1996  . SHOULDER SURGERY    . TONSILLECTOMY     Social History   Socioeconomic History  . Marital status: Married    Spouse name: Not on file  . Number of children: 2  . Years of education: Not on file  . Highest education level: Not on file  Social Needs  . Financial resource strain: Not on file  . Food insecurity - worry: Not on file  . Food insecurity - inability: Not on file  . Transportation needs - medical: Not on file  . Transportation needs - non-medical: Not on file  Occupational History  .  Occupation: Retired Social research officer, government  Tobacco Use  . Smoking status: Never Smoker  . Smokeless tobacco: Never Used  Substance and Sexual Activity  . Alcohol use: Not on file  . Drug use: No  . Sexual activity: Yes  Other Topics Concern  . Not on file  Social History Narrative   Patient is right handed.   Patient drinks 1 cup of coffee per day.   No Known Allergies Family History  Problem Relation Age of Onset  . Pneumonia Mother 21  . Cancer - Lung Father 7  . Multiple myeloma Brother 25     Past medical history, social, surgical and family history all reviewed in electronic medical record.  No pertanent information unless stated regarding to the chief complaint.   Review of Systems:Review of systems updated and as accurate as of 11/15/17  No headache, visual changes, nausea, vomiting, diarrhea, constipation, dizziness, abdominal pain, skin rash, fevers, chills, night sweats, weight loss, swollen lymph nodes, body aches, joint swelling, muscle aches, chest pain, shortness of breath, mood changes.   Objective  There were no vitals taken for this visit. Systems examined below as of 11/15/17   General: No apparent distress alert and oriented x3 mood and  affect normal, dressed appropriately.  HEENT: Pupils equal, extraocular movements intact  Respiratory: Patient's speak in full sentences and does not appear short of breath  Cardiovascular: No lower extremity edema, non tender, no erythema  Skin: Warm dry intact with no signs of infection or rash on extremities or on axial skeleton.  Abdomen: Soft nontender  Neuro: Cranial nerves II through XII are intact, neurovascularly intact in all extremities with 2+ DTRs and 2+ pulses.  Lymph: No lymphadenopathy of posterior or anterior cervical chain or axillae bilaterally.  Gait normal with good balance and coordination.  MSK:  Non tender with full range of motion and good stability and symmetric strength and tone of shoulders, elbows,  wrist, hip, knee and ankles bilaterally.     Impression and Recommendations:     This case required medical decision making of moderate complexity.      Note: This dictation was prepared with Dragon dictation along with smaller phrase technology. Any transcriptional errors that result from this process are unintentional.

## 2017-11-16 ENCOUNTER — Ambulatory Visit: Payer: Medicare Other | Admitting: Family Medicine

## 2017-11-21 ENCOUNTER — Institutional Professional Consult (permissible substitution): Payer: Medicare Other | Admitting: Internal Medicine

## 2017-12-01 DIAGNOSIS — N3000 Acute cystitis without hematuria: Secondary | ICD-10-CM | POA: Diagnosis not present

## 2017-12-05 ENCOUNTER — Ambulatory Visit
Admission: RE | Admit: 2017-12-05 | Discharge: 2017-12-05 | Disposition: A | Discharge status: Home / Self Care | Payer: Medicare Other | Source: Ambulatory Visit | Attending: Family Medicine | Admitting: Family Medicine

## 2017-12-05 DIAGNOSIS — M48062 Spinal stenosis, lumbar region with neurogenic claudication: Secondary | ICD-10-CM

## 2017-12-05 DIAGNOSIS — M545 Low back pain: Secondary | ICD-10-CM | POA: Diagnosis not present

## 2017-12-05 MED ORDER — IOPAMIDOL (ISOVUE-M 200) INJECTION 41%
1.0000 mL | Freq: Once | INTRAMUSCULAR | Status: AC
  Administered 2017-12-05: 1 mL via EPIDURAL

## 2017-12-05 MED ORDER — METHYLPREDNISOLONE ACETATE 40 MG/ML INJ SUSP (RADIOLOG
120.0000 mg | Freq: Once | INTRAMUSCULAR | Status: AC
  Administered 2017-12-05: 120 mg via EPIDURAL

## 2017-12-05 NOTE — Discharge Instructions (Signed)

## 2017-12-08 ENCOUNTER — Encounter: Payer: Self-pay | Admitting: Internal Medicine

## 2017-12-08 ENCOUNTER — Ambulatory Visit (INDEPENDENT_AMBULATORY_CARE_PROVIDER_SITE_OTHER): Payer: Medicare Other | Admitting: Internal Medicine

## 2017-12-08 VITALS — BP 142/76 | HR 69 | Ht 70.0 in | Wt 208.8 lb

## 2017-12-08 DIAGNOSIS — R0609 Other forms of dyspnea: Secondary | ICD-10-CM

## 2017-12-08 DIAGNOSIS — R0602 Shortness of breath: Secondary | ICD-10-CM

## 2017-12-08 NOTE — Progress Notes (Addendum)
Subjective:    Patient ID: Patrick Franco, male    DOB: 1932-07-28, 84 y.o.   MRN: [id number]  PCP Lavone Orn, MD   HPI  PCP Lavone Orn, MD   IOV 12/08/2017  Chief Complaint  Patient presents with  . Advice Only    Referred by Dr. Laurann Montana due to SOB and an abn pft. Pt states SOB has been happening x3 years ago which has become worse.    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  He has chronic thoracic dextroscoliosis.  He is here for insidious onset of shortness of breath.  He tells me that he once a month at home and [place].  The home is at [place],000 feet.  Ever since he turned 84 years old when he is at this home where he spends the summer for 2 months he is always hypoxemic and short of breath climbing a flight of stairs.  He is having to use oxygen quite a bit there.  He does not have a problem at night because he uses a CPAP.  He says he never gets acclimate highest.  He is able to handle 7000 feet pretty well when he was fly fishing but when he gets to his home at 10,000 feet he has a problem.  He thinks the severity of this problem is getting worse slowly.  In addition he is also plays doubles tennis and he is getting more more short of breath in the last 3 years.  However for day-to-day exertion he does not have a problem.  He goes for daily walks but this is compounded by the fact he has back pain from his chronic dextroscoliosis in the thoracic region.  When he gets an epidural shot he is able to walk more   Previous imaging personally visualized.  Chest x-ray August 08, 2017 shows profound thoracic scoliosis.  This is also seen in a CT abdomen lung cut from 2017 that is associated with left-sided basal atelectasis.  In that lung cut I do not see any evidence of ILD.   He did not have ischemia on a cardiac stress test the same month. Echocardiogram May 2018: Ejection fraction 65% with grade 1 diastolic  dysfunction mild aortic stenosis.   Last hemoglobin check was in October 2016 and he was anemic at the time 9.8 g%. -According to the patient this happened when he had GI bleed.  He says since then this is resolved.  He believes his hemoglobin is being rechecked by his primary care physician and this should be normal because he is not aware that he is anemic anymore.  He says he will have his this rechecked by his primary care physician in the coming weeks.  Walking desat in office: did not desat 185 feet x 3 laps   has a past medical history of Aortic stenosis, BPH (benign prostatic hyperplasia), Chronic low back pain, Depression, Diverticulitis, Elevated PSA, Foot drop, right, Foot drop, right, Gait disorder (02/03/2015), Hip pain, History of colonoscopy, HOH (hard of hearing), Hypertension, Lumbar radiculopathy, Mitral valve prolapse, Onychomycosis, Peptic ulcer disease, Sepsis ([place]) (2005), Sleep apnea, and Torn ACL.   reports that he quit smoking about 58 years ago. His smoking use included cigarettes. He started smoking about 59 years ago. He smoked 0.50 packs per day. he has never used smokeless tobacco.  Past Surgical History:  Procedure Laterality Date  . CATARACT EXTRACTION Bilateral   . ESOPHAGOGASTRODUODENOSCOPY N/A  08/19/2015   Procedure: ESOPHAGOGASTRODUODENOSCOPY (EGD);  Surgeon: Laurence Spates, MD;  Location: [facility] ENDOSCOPY;  Service: Endoscopy;  Laterality: N/A;  . right knee replacement Right 1996  . SHOULDER SURGERY    . TONSILLECTOMY      No Known Allergies  Immunization History  Administered Date(s) Administered  . Influenza Split 08/29/2012  . Influenza, High Dose Seasonal PF 09/10/2017  . Influenza,inj,Quad PF,6+ Mos 09/12/2013, 09/11/2015    Family History  Problem Relation Age of Onset  . Pneumonia Mother 74  . Cancer - Lung Father 41  . Multiple myeloma Brother 1     Current Outpatient Medications:  .  allopurinol (ZYLOPRIM) 100 MG tablet, Take 1 tablet  (100 mg total) by mouth daily., Disp: 90 tablet, Rfl: 3 .  colchicine 0.6 MG tablet, Take 1 tablet (0.6 mg total) by mouth 2 (two) times daily as needed., Disp: 30 tablet, Rfl: 2 .  dutasteride (AVODART) 0.5 MG capsule, Take 0.5 mg by mouth daily., Disp: , Rfl: 2 .  esomeprazole (NEXIUM) 40 MG capsule, Take 1 capsule (40 mg total) by mouth 2 (two) times daily before a meal., Disp: 60 capsule, Rfl: 0 .  gabapentin (NEURONTIN) 400 MG capsule, Take 1 capsule (400 mg total) by mouth at bedtime., Disp: 30 capsule, Rfl: 3 .  indapamide (LOZOL) 2.5 MG tablet, Take 2.5 mg by mouth daily., Disp: , Rfl: 1 .  mirtazapine (REMERON) 30 MG tablet, Take 1 tablet (30 mg total) by mouth at bedtime., Disp: 30 tablet, Rfl: 1 .  Omega-3 Fatty Acids (FISH OIL PO), Take 1,000 mg by mouth daily., Disp: , Rfl:  .  Probiotic Product (PROBIOTIC DAILY PO), Take 1 tablet by mouth daily., Disp: , Rfl:  .  tamsulosin (FLOMAX) 0.4 MG CAPS capsule, Take 0.4 mg by mouth daily., Disp: , Rfl: 2 .  Turmeric 500 MG CAPS, Take 500 mg by mouth daily., Disp: , Rfl:  .  verapamil (CALAN-SR) 240 MG CR tablet, Take 240 mg by mouth daily., Disp: , Rfl: 2 .  Vitamin D, Ergocalciferol, (DRISDOL) 50000 units CAPS capsule, Take 1 capsule (50,000 Units total) by mouth once a week., Disp: 12 capsule, Rfl: 3 .  acetaZOLAMIDE (DIAMOX) 125 MG tablet, Take 1 tablet (125 mg total) by mouth as needed (altitude changes). (Patient not taking: Reported on 12/08/2017), Disp: 60 tablet, Rfl: 0   Review of Systems  Constitutional: Negative for fever and unexpected weight change.  HENT: Negative for congestion, dental problem, ear pain, nosebleeds, postnasal drip, rhinorrhea, sinus pressure, sneezing, sore throat and trouble swallowing.   Eyes: Negative for redness and itching.  Respiratory: Positive for shortness of breath. Negative for cough, chest tightness and wheezing.   Cardiovascular: Negative for palpitations and leg swelling.  Gastrointestinal:  Negative for nausea and vomiting.  Genitourinary: Negative for dysuria.  Musculoskeletal: Negative for joint swelling.  Skin: Negative for rash.  Allergic/Immunologic: Negative.  Negative for environmental allergies, food allergies and immunocompromised state.  Neurological: Negative for headaches.  Hematological: Bruises/bleeds easily.  Psychiatric/Behavioral: Negative for dysphoric mood. The patient is not nervous/anxious.        Objective:   Physical Exam  Constitutional: He is oriented to person, place, and time. He appears well-developed and well-nourished. No distress.  HENT:  Head: Normocephalic and atraumatic.  Right Ear: External ear normal.  Left Ear: External ear normal.  Mouth/Throat: Oropharynx is clear and moist. No oropharyngeal exudate.  Eyes: Conjunctivae and EOM are normal. Pupils are equal, round, and reactive to light.  Right eye exhibits no discharge. Left eye exhibits no discharge. No scleral icterus.  Neck: Normal range of motion. Neck supple. No JVD present. No tracheal deviation present. No thyromegaly present.  Cardiovascular: Normal rate, regular rhythm and intact distal pulses. Exam reveals no gallop and no friction rub.  Murmur heard. Pulmonary/Chest: Breath sounds normal. No respiratory distress. He has no wheezes. He has no rales. He exhibits no tenderness.  scloliosius  Abdominal: Soft. Bowel sounds are normal. He exhibits no distension and no mass. There is no tenderness. There is no rebound and no guarding.  visc obesity  Musculoskeletal: Normal range of motion. He exhibits no edema or tenderness.  Lymphadenopathy:    He has no cervical adenopathy.  Neurological: He is alert and oriented to person, place, and time. He has normal reflexes. No cranial nerve deficit. Coordination normal.  Skin: Skin is warm and dry. No rash noted. He is not diaphoretic. No erythema. No pallor.  Psychiatric: He has a normal mood and affect. His behavior is normal. Judgment  and thought content normal.  Nursing note and vitals reviewed.  Vitals:   12/08/17 0902  BP: (!) 142/76  Pulse: 69  SpO2: 97%  Weight: 208 lb 12.8 oz (94.7 kg)  Height: 5' 10"  (1.778 m)          Assessment & Plan:     ICD-10-CM   1. Dyspnea on exertion R06.09 CT Chest High Resolution  2. Shortness of breath R06.02 CT Chest High Resolution    Rule out ILD Get HRCT If negative, do CPST Patient will ensure with PCP Lavone Orn, MD he is not anemic anymore   Dr. Brand Males, M.D., [facility].C.P Pulmonary and Critical Care Medicine Staff Physician, [place] Director - Interstitial Lung Disease  Program  Pulmonary [place] at [place], [place], [address]  Pager: [phone], If no answer or between  15:00h - 7:00h: call [phone] Telephone: [phone]

## 2017-12-08 NOTE — Patient Instructions (Signed)
ICD-10-CM   1. Dyspnea on exertion R06.09     Do HRCT supine and prone Will call with results to decide next step IF this does not show pulmonary fibrosis or a cause for symptoms, then we will do bike pulmonary stress test

## 2017-12-15 ENCOUNTER — Other Ambulatory Visit: Payer: Self-pay | Admitting: Family Medicine

## 2017-12-15 NOTE — Telephone Encounter (Signed)
Refill done.  

## 2017-12-21 ENCOUNTER — Inpatient Hospital Stay: Admission: RE | Admit: 2017-12-21 | Payer: Medicare Other | Source: Ambulatory Visit

## 2017-12-26 DIAGNOSIS — N401 Enlarged prostate with lower urinary tract symptoms: Secondary | ICD-10-CM | POA: Diagnosis not present

## 2017-12-26 DIAGNOSIS — R05 Cough: Secondary | ICD-10-CM | POA: Diagnosis not present

## 2017-12-26 DIAGNOSIS — I10 Essential (primary) hypertension: Secondary | ICD-10-CM | POA: Diagnosis not present

## 2017-12-26 DIAGNOSIS — R0981 Nasal congestion: Secondary | ICD-10-CM | POA: Diagnosis not present

## 2017-12-26 DIAGNOSIS — I480 Paroxysmal atrial fibrillation: Secondary | ICD-10-CM | POA: Diagnosis not present

## 2017-12-26 DIAGNOSIS — F329 Major depressive disorder, single episode, unspecified: Secondary | ICD-10-CM | POA: Diagnosis not present

## 2017-12-27 ENCOUNTER — Ambulatory Visit (INDEPENDENT_AMBULATORY_CARE_PROVIDER_SITE_OTHER)
Admission: RE | Admit: 2017-12-27 | Discharge: 2017-12-27 | Disposition: A | Discharge status: Home / Self Care | Payer: Medicare Other | Source: Ambulatory Visit | Attending: Internal Medicine | Admitting: Internal Medicine

## 2017-12-27 DIAGNOSIS — R0609 Other forms of dyspnea: Secondary | ICD-10-CM

## 2017-12-27 DIAGNOSIS — R0602 Shortness of breath: Secondary | ICD-10-CM | POA: Diagnosis not present

## 2017-12-29 ENCOUNTER — Telehealth: Payer: Self-pay | Admitting: Internal Medicine

## 2017-12-29 NOTE — Telephone Encounter (Signed)
IMPRESSION: CT chest [name]   1. Tracheobronchomalacia. 2. Moderate patchy air trapping in both lungs, indicative of small airways disease. 3. Scattered mild cylindrical and varicoid bronchiectasis in both lungs. Areas of postinfectious/postinflammatory scarring in the mid to lower lungs bilaterally. Otherwise no evidence of interstitial lung disease.-> #1, #2 and #3 can explain dyspnea 4. Borderline mild cardiomegaly. Dilated main pulmonary artery, suggesting pulmonary arterial hypertension. 5. Left main and 3 vessel coronary atherosclerosis.- not to worry already had normal stress test 6. Inferior left thyroid lobe 4.6 cm nodule, for which thyroid ultrasound correlation is warranted if not previously performed.- needs to talk to pcp 7. Diffuse hepatic steatosis.- talk to pcp 8. Stable right adrenal adenoma.  Aortic Atherosclerosis (ICD10-I70.0).   Have him come in first avail to discuss findings and discussin of next step  Dr. Brand Males, M.D., [facility].C.P Pulmonary and Critical Care Medicine Staff Physician, [place] Director - Interstitial Lung Disease  Program  Pulmonary [place] at [place], [place], [address]  Pager: [phone], If no answer or between  15:00h - 7:00h: call [phone] Telephone: [phone]     Electronically Signed   By: Ilona Sorrel M.D.   On: 12/27/2017 16:47

## 2018-01-03 NOTE — Progress Notes (Signed)
[facility] [address] [place], [place] [address] Phone: [phone] Subjective:    I'm seeing this patient by the request  of:    CC: Back pain follow-up  NLG:[id number]  Patrick Franco is a 84 y.o. male coming in with complaint of back pain. He is no longer having pain after getting the epidural injection. History shows the patient does have spinal stenosis.  Last injection was 2 weeks ago.  Patient has been on Remeron and doing well as well with sleep.  Feels like he is doing better.  Looking forward to getting back to tennis.  Patient feels like he did very well with physical therapy and thinks he may need to do it again.     Past Medical History:  Diagnosis Date  . Aortic stenosis   . BPH (benign prostatic hyperplasia)   . Chronic low back pain   . Depression   . Diverticulitis   . Elevated PSA   . Foot drop, right   . Foot drop, right   . Gait disorder 02/03/2015  . Hip pain   . History of colonoscopy   . HOH (hard of hearing)    hearing aid  . Hypertension   . Lumbar radiculopathy   . Mitral valve prolapse   . Onychomycosis   . Peptic ulcer disease   . Sepsis ([place]) 2005   e. coli-after prostate bx  . Sleep apnea   . Torn ACL    Past Surgical History:  Procedure Laterality Date  . CATARACT EXTRACTION Bilateral   . ESOPHAGOGASTRODUODENOSCOPY N/A 08/19/2015   Procedure: ESOPHAGOGASTRODUODENOSCOPY (EGD);  Surgeon: Laurence Spates, MD;  [facility] END[facility]COPY;  Service: Endoscopy;  Laterality: N/A;  . right knee replacement Right 1996  . SHOULDER SURGERY    . TONSILLECTOMY     Social History   Socioeconomic History  . Marital status: Married    Spouse name: None  . Number of children: 2  . Years of education: None  . Highest education level: None  Social Needs  . Financial resource strain: None  . Food insecurity - worry: None  . Food insecurity - inability: None  . Transportation needs - medical: None  . Transportation needs -  non-medical: None  Occupational History  . Occupation: Retired Social research officer, government  Tobacco Use  . Smoking status: Former Smoker    Packs/day: 0.50    Types: Cigarettes    Start date: 1960    Last attempt to quit: 1961    Years since quitting: 58.1  . Smokeless tobacco: Never Used  . 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 Alcohol use: None  . Drug use: No  . Sexual activity: Yes  Other Topics Concern  . None  Social History Narrative   Patient is right handed.   Patient drinks 1 cup of coffee per day.   No Known Allergies Family History  Problem Relation Age of Onset  . Pneumonia Mother 874 . Cancer - Lung Father 5109 . Multiple myeloma Brother 520    Past medical history, social, surgical and family history all reviewed in electronic medical record.  No pertanent information unless stated regarding to the chief complaint.   Review of Systems:Review of systems updated and as accurate as of 01/04/18  No headache, visual changes, nausea, vomiting, diarrhea, constipation, dizziness, abdominal pain, skin rash, fevers, chills, night sweats, weight loss, swollen lymph nodes, body aches,  joint swelling, chest pain, shortness of breath, mood changes.  Positive muscle aches  Objective  Blood pressure 116/70, pulse 76, height 5' 10"  (1.778 m), weight 202 lb (91.6 kg), SpO2 95 %. Systems examined below as of 01/04/18   General: No apparent distress alert and oriented x3 mood and affect normal, dressed appropriately.  HEENT: Pupils equal, extraocular movements intact  Respiratory: Patient's speak in full sentences and does not appear short of breath  Cardiovascular: No lower extremity edema, non tender, no erythema  Skin: Warm dry intact with no signs of infection or rash on extremities or on axial skeleton.  Abdomen: Soft nontender  Neuro: Cranial nerves II through XII are intact, neurovascularly intact in all extremities with 2+ DTRs and  2+ pulses.  Lymph: No lymphadenopathy of posterior or anterior cervical chain or axillae bilaterally.  Gait normal with good balance and coordination.  MSK:  Mild tender with full range of motion and good stability and symmetric strength and tone of shoulders, elbows, hip, knee and ankles bilaterally.   Back exam shows the patient does have a significant decrease in lordosis.  Patient has limited range of motion with 25 degrees of forward flexion, 5 degrees of extension but near full range of rotation and side bending.  Patient does have significant tightness with Corky Sox test bilaterally.  Tightness of the hamstrings    Impression and Recommendations:     This case required medical decision making of moderate complexity.      Note: This dictation was prepared with Dragon dictation along with smaller phrase technology. Any transcriptional errors that result from this process are unintentional.

## 2018-01-03 NOTE — Telephone Encounter (Signed)
Called pt letting him know the results of the ct scan he had done.  Also stated to pt I was going to send the results to Dr. Laurann Montana (PCP) to make them aware of findings on CT.  Pt asked to have a copy of results mailed to his address.  Scheduled a f/u appt for pt to see MR 01/25/18.   Routing this information to pt's pcp and placing in mail for pt.  Nothing further needed at this current time.

## 2018-01-04 ENCOUNTER — Encounter: Payer: Self-pay | Admitting: Family Medicine

## 2018-01-04 ENCOUNTER — Ambulatory Visit (INDEPENDENT_AMBULATORY_CARE_PROVIDER_SITE_OTHER): Payer: Medicare Other | Admitting: Family Medicine

## 2018-01-04 VITALS — BP 116/70 | HR 76 | Ht 70.0 in | Wt 202.0 lb

## 2018-01-04 DIAGNOSIS — M48061 Spinal stenosis, lumbar region without neurogenic claudication: Secondary | ICD-10-CM

## 2018-01-04 DIAGNOSIS — M48062 Spinal stenosis, lumbar region with neurogenic claudication: Secondary | ICD-10-CM | POA: Diagnosis not present

## 2018-01-04 DIAGNOSIS — M4800 Spinal stenosis, site unspecified: Secondary | ICD-10-CM

## 2018-01-04 NOTE — Assessment & Plan Note (Signed)
Patient is responded fairly well to the epidural injections.  We discussed repeating formal physical therapy which patient agreed with.  Continue the vitamin supplementation in the icing regimen.  We discussed continue the same medications.  Follow-up again in 4 weeks

## 2018-01-25 ENCOUNTER — Encounter: Payer: Self-pay | Admitting: Internal Medicine

## 2018-01-25 ENCOUNTER — Ambulatory Visit (INDEPENDENT_AMBULATORY_CARE_PROVIDER_SITE_OTHER): Payer: Medicare Other | Admitting: Internal Medicine

## 2018-01-25 VITALS — BP 124/72 | HR 77 | Ht 70.0 in | Wt 206.8 lb

## 2018-01-25 DIAGNOSIS — R0602 Shortness of breath: Secondary | ICD-10-CM | POA: Diagnosis not present

## 2018-01-25 DIAGNOSIS — J398 Other specified diseases of upper respiratory tract: Secondary | ICD-10-CM

## 2018-01-25 DIAGNOSIS — R0989 Other specified symptoms and signs involving the circulatory and respiratory systems: Secondary | ICD-10-CM

## 2018-01-25 DIAGNOSIS — J479 Bronchiectasis, uncomplicated: Secondary | ICD-10-CM

## 2018-01-25 DIAGNOSIS — E041 Nontoxic single thyroid nodule: Secondary | ICD-10-CM

## 2018-01-25 MED ORDER — TIOTROPIUM BROMIDE-OLODATEROL 2.5-2.5 MCG/ACT IN AERS: 2.0000 | INHALATION_SPRAY | Freq: Every day | RESPIRATORY_TRACT | 0 refills | Status: DC

## 2018-01-25 MED ORDER — FLUTTER DEVI: 0 refills | Status: DC

## 2018-01-25 NOTE — Progress Notes (Signed)
Subjective:     Patient ID: Patrick Franco, male   DOB: 1931/12/21, 84 y.o.   MRN: [id number]  HPI   PCP Lavone Orn, MD   HPI  PCP Lavone Orn, MD   IOV 12/08/2017  Chief Complaint  Patient presents with  . Advice Only    Referred by Dr. Laurann Montana due to SOB and an abn pft. Pt states SOB has been happening x3 years ago which has become worse.    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  He has chronic thoracic dextroscoliosis.  He is here for insidious onset of shortness of breath.  He tells me that he once a month at home and [place].  The home is at [place],000 feet.  Ever since he turned 84 years old when he is at this home where he spends the summer for 2 months he is always hypoxemic and short of breath climbing a flight of stairs.  He is having to use oxygen quite a bit there.  He does not have a problem at night because he uses a CPAP.  He says he never gets acclimate highest.  He is able to handle 7000 feet pretty well when he was fly fishing but when he gets to his home at 10,000 feet he has a problem.  He thinks the severity of this problem is getting worse slowly.  In addition he is also plays doubles tennis and he is getting more more short of breath in the last 3 years.  However for day-to-day exertion he does not have a problem.  He goes for daily walks but this is compounded by the fact he has back pain from his chronic dextroscoliosis in the thoracic region.  When he gets an epidural shot he is able to walk more   Previous imaging personally visualized.  Chest x-ray August 08, 2017 shows profound thoracic scoliosis.  This is also seen in a CT abdomen lung cut from 2017 that is associated with left-sided basal atelectasis.  In that lung cut I do not see any evidence of ILD.   He did not have ischemia on a cardiac stress test the same month. Echocardiogram May 2018: Ejection fraction 65% with grade 1 diastolic  dysfunction mild aortic stenosis.   Last hemoglobin check was in October 2016 and he was anemic at the time 9.8 g%. -According to the patient this happened when he had GI bleed.  He says since then this is resolved.  He believes his hemoglobin is being rechecked by his primary care physician and this should be normal because he is not aware that he is anemic anymore.  He says he will have his this rechecked by his primary care physician in the coming weeks.  Walking desat in office: did not desat 185 feet x 3 laps  OV 01/25/2018  Chief Complaint  Patient presents with  . Follow-up    HRCT done 12/27/17  Pt states he is still becoming SOB but then after he sits down to rest, he will be able to do other activities again. Denies  any complaints of cough or CP.    Here with wife to discuss below. Wife reports that albuterol during spirometry helped dyspnea. No new issues. REports being on CPAP for OSA. Does exercises such as yoga and pilates. Plays doubles tennis without moving due to dyspnea.    1. Tracheobronchomalacia. CT chest 12/27/17 2. Moderate patchy air trapping in both lungs,  indicative of small airways disease. 3. Scattered mild cylindrical and varicoid bronchiectasis in both lungs. Areas of postinfectious/postinflammatory scarring in the mid to lower lungs bilaterally. Otherwise no evidence of interstitial lung disease.-> #1, #2 and #3 can explain dyspnea 4. Borderline mild cardiomegaly. Dilated main pulmonary artery, suggesting pulmonary arterial hypertension. 5. Left main and 3 vessel coronary atherosclerosis.- not to worry already had normal stress test 6. Inferior left thyroid lobe 4.6 cm nodule, for which thyroid ultrasound correlation is warranted if not previously performed.- needs to talk to pcp 7. Diffuse hepatic steatosis.- talk to pcp 8. Stable right adrenal adenoma.  Aortic Atherosclerosis (ICD10-I70.0).      has a past medical history of Aortic stenosis, BPH  (benign prostatic hyperplasia), Chronic low back pain, Depression, Diverticulitis, Elevated PSA, Foot drop, right, Foot drop, right, Gait disorder (02/03/2015), Hip pain, History of colonoscopy, HOH (hard of hearing), Hypertension, Lumbar radiculopathy, Mitral valve prolapse, Onychomycosis, Peptic ulcer disease, Sepsis ([place]) (2005), Sleep apnea, and Torn ACL.   reports that he quit smoking about 58 years ago. His smoking use included cigarettes. He started smoking about 59 years ago. He smoked 0.50 packs per day. he has never used smokeless tobacco.  Past Surgical History:  Procedure Laterality Date  . CATARACT EXTRACTION Bilateral   . ESOPHAGOGASTRODUODENOSCOPY N/A 08/19/2015   Procedure: ESOPHAGOGASTRODUODENOSCOPY (EGD);  Surgeon: Laurence Spates, MD;  Location: [facility] ENDOSCOPY;  Service: Endoscopy;  Laterality: N/A;  . right knee replacement Right 1996  . SHOULDER SURGERY    . TONSILLECTOMY      No Known Allergies  Immunization History  Administered Date(s) Administered  . Influenza Split 08/29/2012  . Influenza, High Dose Seasonal PF 09/10/2017  . Influenza,inj,Quad PF,6+ Mos 09/12/2013, 09/11/2015  . Pneumococcal Conjugate-13 01/13/2014  . Pneumococcal Polysaccharide-23 11/14/2004  . Tdap 01/18/2016    Family History  Problem Relation Age of Onset  . Pneumonia Mother 30  . Cancer - Lung Father 15  . Multiple myeloma Brother 59     Current Outpatient Medications:  .  allopurinol (ZYLOPRIM) 100 MG tablet, Take 1 tablet (100 mg total) by mouth daily., Disp: 90 tablet, Rfl: 3 .  dutasteride (AVODART) 0.5 MG capsule, Take 0.5 mg by mouth daily., Disp: , Rfl: 2 .  esomeprazole (NEXIUM) 40 MG capsule, Take 1 capsule (40 mg total) by mouth 2 (two) times daily before a meal., Disp: 60 capsule, Rfl: 0 .  gabapentin (NEURONTIN) 400 MG capsule, Take 1 capsule (400 mg total) by mouth at bedtime., Disp: 30 capsule, Rfl: 3 .  indapamide (LOZOL) 2.5 MG tablet, Take 2.5 mg by mouth daily., Disp:  , Rfl: 1 .  mirtazapine (REMERON) 30 MG tablet, TAKE ONE TABLET AT BEDTIME., Disp: 90 tablet, Rfl: 0 .  Multiple Vitamin (MULTIVITAMIN) tablet, Take 1 tablet by mouth daily., Disp: , Rfl:  .  Omega-3 Fatty Acids (FISH OIL PO), Take 1,000 mg by mouth daily., Disp: , Rfl:  .  Probiotic Product (PROBIOTIC DAILY PO), Take 1 tablet by mouth daily., Disp: , Rfl:  .  tamsulosin (FLOMAX) 0.4 MG CAPS capsule, Take 0.4 mg by mouth daily., Disp: , Rfl: 2 .  Turmeric 500 MG CAPS, Take 500 mg by mouth daily., Disp: , Rfl:  .  verapamil (CALAN-SR) 240 MG CR tablet, Take 240 mg by mouth daily., Disp: , Rfl: 2 .  Vitamin D, Ergocalciferol, (DRISDOL) 50000 units CAPS capsule, Take 1 capsule (50,000 Units total) by mouth once a week., Disp: 12 capsule, Rfl: 3 .  colchicine  0.6 MG tablet, Take 1 tablet (0.6 mg total) by mouth 2 (two) times daily as needed. (Patient not taking: Reported on 01/25/2018), Disp: 30 tablet, Rfl: 2   Review of Systems     Objective:   Physical Exam Vitals:   01/25/18 0858  BP: 124/72  Pulse: 77  SpO2: 98%  Weight: 206 lb 12.8 oz (93.8 kg)  Height: 5' 10"  (1.778 m)    Discussion only visit    Assessment:       ICD-10-CM   1. Shortness of breath R06.02   2. Tracheobronchomalacia J39.8   3. Pulmonary air trapping R09.89   4. Bronchiectasis without complication ([place]) B09.6   5. Nodule of left lobe of thyroid gland E04.1        Plan:     Shortness of breath Tracheobronchomalacia Pulmonary air trapping Bronchiectasis without complication (HCC) - mild due to above  - start stiolto  - use incentive spirometry during day time 10 times daily  - use flutter valve during day time 10 times daily - use CPAP at night - continue yoga, pilates and exercises - if no response to above, can consider [facility] referral v [facility]m rehab v both   Nodule of left lobe of thyroid gland - refer Dr Buddy Duty @ [facility] (large thyroid nodule can  Cause airway compression issues but in  your case I do not know for sure one way or other)  Followup 6-8 weeks for followup   > 50% of this > 25 min visit spent in face to face counseling or coordination of care    Dr. Brand Males, M.D., [facility].C.P Pulmonary and Critical Care Medicine Staff Physician, [place] Director - Interstitial Lung Disease  Program  Pulmonary [place] at [place], [place], [address]  Pager: [phone], If no answer or between  15:00h - 7:00h: call [phone] Telephone: [phone]

## 2018-01-25 NOTE — Patient Instructions (Signed)
Shortness of breath Tracheobronchomalacia Pulmonary air trapping Bronchiectasis without complication (HCC) - mild due to above  - start stiolto  - use incentive spirometry during day time 10 times daily  - use flutter valve during day time 10 times daily - use CPAP at night - continue yoga, pilates and exercises - if no response to above, can consider [facility] rehab v both   Nodule of left lobe of thyroid gland - refer Dr Buddy Duty @ [facility] Endocrinology (large thyroid nodule can  Cause airway compression issues but in your case I do not know for sure one way or other)  Followup 6-8 weeks for followup

## 2018-01-25 NOTE — Addendum Note (Signed)
Addended by: Parke Poisson E on: 01/25/2018 09:45 AM   Modules accepted: Orders

## 2018-01-25 NOTE — Addendum Note (Signed)
Addended by: Lorretta Harp on: 01/25/2018 09:56 AM   Modules accepted: Orders

## 2018-01-26 DIAGNOSIS — M1712 Unilateral primary osteoarthritis, left knee: Secondary | ICD-10-CM | POA: Diagnosis not present

## 2018-01-26 DIAGNOSIS — M419 Scoliosis, unspecified: Secondary | ICD-10-CM | POA: Diagnosis not present

## 2018-01-26 DIAGNOSIS — M545 Low back pain: Secondary | ICD-10-CM | POA: Diagnosis not present

## 2018-01-26 DIAGNOSIS — M5416 Radiculopathy, lumbar region: Secondary | ICD-10-CM | POA: Diagnosis not present

## 2018-01-29 DIAGNOSIS — G4733 Obstructive sleep apnea (adult) (pediatric): Secondary | ICD-10-CM | POA: Diagnosis not present

## 2018-01-30 DIAGNOSIS — Z8711 Personal history of peptic ulcer disease: Secondary | ICD-10-CM | POA: Diagnosis not present

## 2018-01-30 DIAGNOSIS — N401 Enlarged prostate with lower urinary tract symptoms: Secondary | ICD-10-CM | POA: Diagnosis not present

## 2018-01-30 DIAGNOSIS — I2510 Atherosclerotic heart disease of native coronary artery without angina pectoris: Secondary | ICD-10-CM | POA: Diagnosis not present

## 2018-01-30 DIAGNOSIS — F329 Major depressive disorder, single episode, unspecified: Secondary | ICD-10-CM | POA: Diagnosis not present

## 2018-01-30 DIAGNOSIS — I10 Essential (primary) hypertension: Secondary | ICD-10-CM | POA: Diagnosis not present

## 2018-01-30 DIAGNOSIS — I350 Nonrheumatic aortic (valve) stenosis: Secondary | ICD-10-CM | POA: Diagnosis not present

## 2018-01-30 DIAGNOSIS — R7301 Impaired fasting glucose: Secondary | ICD-10-CM | POA: Diagnosis not present

## 2018-01-30 DIAGNOSIS — Z1389 Encounter for screening for other disorder: Secondary | ICD-10-CM | POA: Diagnosis not present

## 2018-01-30 DIAGNOSIS — E041 Nontoxic single thyroid nodule: Secondary | ICD-10-CM | POA: Diagnosis not present

## 2018-01-30 DIAGNOSIS — Z0000 Encounter for general adult medical examination without abnormal findings: Secondary | ICD-10-CM | POA: Diagnosis not present

## 2018-01-30 DIAGNOSIS — M109 Gout, unspecified: Secondary | ICD-10-CM | POA: Diagnosis not present

## 2018-01-31 ENCOUNTER — Other Ambulatory Visit: Payer: Self-pay | Admitting: Internal Medicine

## 2018-01-31 DIAGNOSIS — E041 Nontoxic single thyroid nodule: Secondary | ICD-10-CM

## 2018-02-06 DIAGNOSIS — M5416 Radiculopathy, lumbar region: Secondary | ICD-10-CM | POA: Diagnosis not present

## 2018-02-06 DIAGNOSIS — M545 Low back pain: Secondary | ICD-10-CM | POA: Diagnosis not present

## 2018-02-06 DIAGNOSIS — M419 Scoliosis, unspecified: Secondary | ICD-10-CM | POA: Diagnosis not present

## 2018-02-06 DIAGNOSIS — M1712 Unilateral primary osteoarthritis, left knee: Secondary | ICD-10-CM | POA: Diagnosis not present

## 2018-02-13 ENCOUNTER — Ambulatory Visit
Admission: RE | Admit: 2018-02-13 | Discharge: 2018-02-13 | Disposition: A | Discharge status: Home / Self Care | Payer: Medicare Other | Source: Ambulatory Visit | Attending: Internal Medicine | Admitting: Internal Medicine

## 2018-02-13 DIAGNOSIS — E041 Nontoxic single thyroid nodule: Secondary | ICD-10-CM | POA: Diagnosis not present

## 2018-02-15 ENCOUNTER — Other Ambulatory Visit: Payer: Self-pay | Admitting: Internal Medicine

## 2018-02-15 DIAGNOSIS — E041 Nontoxic single thyroid nodule: Secondary | ICD-10-CM

## 2018-02-16 ENCOUNTER — Telehealth: Payer: Self-pay | Admitting: Cardiovascular Disease

## 2018-02-16 NOTE — Telephone Encounter (Signed)
New message  Dr.Griffin verbalized that he is calling for Dr.McAlhany   To go over patients CAT SCAN, SOB and Multivessel Cardiac Disease

## 2018-02-16 NOTE — Telephone Encounter (Signed)
I spoke to Dr. Laurann Montana. He mainly wanted to review results of the chest CT which noted some CAD. Pat, Can we touch base with Mr. Zou, move his echo up to May and have me see him end of May? Thanks, chris

## 2018-02-16 NOTE — Telephone Encounter (Signed)
Left message to call back  

## 2018-02-16 NOTE — Telephone Encounter (Signed)
I spoke with pt and scheduled echocardiogram for May 13 and appointment with Dr. Angelena Form on May 15.

## 2018-03-02 DIAGNOSIS — M545 Low back pain: Secondary | ICD-10-CM | POA: Diagnosis not present

## 2018-03-02 DIAGNOSIS — M5416 Radiculopathy, lumbar region: Secondary | ICD-10-CM | POA: Diagnosis not present

## 2018-03-02 DIAGNOSIS — M419 Scoliosis, unspecified: Secondary | ICD-10-CM | POA: Diagnosis not present

## 2018-03-02 DIAGNOSIS — M1712 Unilateral primary osteoarthritis, left knee: Secondary | ICD-10-CM | POA: Diagnosis not present

## 2018-03-09 DIAGNOSIS — M1712 Unilateral primary osteoarthritis, left knee: Secondary | ICD-10-CM | POA: Diagnosis not present

## 2018-03-09 DIAGNOSIS — M545 Low back pain: Secondary | ICD-10-CM | POA: Diagnosis not present

## 2018-03-09 DIAGNOSIS — M5416 Radiculopathy, lumbar region: Secondary | ICD-10-CM | POA: Diagnosis not present

## 2018-03-09 DIAGNOSIS — M419 Scoliosis, unspecified: Secondary | ICD-10-CM | POA: Diagnosis not present

## 2018-03-14 ENCOUNTER — Other Ambulatory Visit (HOSPITAL_COMMUNITY)
Admission: RE | Admit: 2018-03-14 | Discharge: 2018-03-14 | Disposition: A | Discharge status: Home / Self Care | Payer: Medicare Other | Source: Ambulatory Visit | Attending: Radiology | Admitting: Radiology

## 2018-03-14 ENCOUNTER — Ambulatory Visit
Admission: RE | Admit: 2018-03-14 | Discharge: 2018-03-14 | Disposition: A | Discharge status: Home / Self Care | Payer: Medicare Other | Source: Ambulatory Visit | Attending: Internal Medicine | Admitting: Internal Medicine

## 2018-03-14 DIAGNOSIS — E041 Nontoxic single thyroid nodule: Secondary | ICD-10-CM

## 2018-03-15 ENCOUNTER — Ambulatory Visit (INDEPENDENT_AMBULATORY_CARE_PROVIDER_SITE_OTHER): Payer: Medicare Other | Admitting: Internal Medicine

## 2018-03-15 ENCOUNTER — Telehealth: Payer: Self-pay | Admitting: Internal Medicine

## 2018-03-15 ENCOUNTER — Encounter: Payer: Self-pay | Admitting: Internal Medicine

## 2018-03-15 VITALS — BP 118/70 | HR 77 | Ht 70.0 in | Wt 209.2 lb

## 2018-03-15 DIAGNOSIS — R0602 Shortness of breath: Secondary | ICD-10-CM | POA: Diagnosis not present

## 2018-03-15 DIAGNOSIS — J398 Other specified diseases of upper respiratory tract: Secondary | ICD-10-CM | POA: Diagnosis not present

## 2018-03-15 DIAGNOSIS — R0989 Other specified symptoms and signs involving the circulatory and respiratory systems: Secondary | ICD-10-CM | POA: Diagnosis not present

## 2018-03-15 DIAGNOSIS — J479 Bronchiectasis, uncomplicated: Secondary | ICD-10-CM | POA: Diagnosis not present

## 2018-03-15 MED ORDER — TIOTROPIUM BROMIDE-OLODATEROL 2.5-2.5 MCG/ACT IN AERS: 2.0000 | INHALATION_SPRAY | Freq: Every day | RESPIRATORY_TRACT | 0 refills | Status: DC

## 2018-03-15 MED ORDER — TIOTROPIUM BROMIDE-OLODATEROL 2.5-2.5 MCG/ACT IN AERS: 2.0000 | INHALATION_SPRAY | Freq: Every day | RESPIRATORY_TRACT | 5 refills | Status: DC

## 2018-03-15 NOTE — Telephone Encounter (Signed)
Will do. Thanks.

## 2018-03-15 NOTE — Telephone Encounter (Signed)
[place] seeing you later in month. He has dyspnea due to tracheobronchomalacia . STress test a year ago was fine . PCP due to recent co art calcification on CT has odered echo and started lipitor. He gets "gassed" in tennis. Wondering If any rhythm issues? Please evaluate  THanks  Dr. Brand Males, M.D., [facility].C.P Pulmonary and Critical Care Medicine Staff Physician, [place] Director - Interstitial Lung Disease  Program  Pulmonary [place] at [place], [place], [address]  Pager: [phone], If no answer or between  15:00h - 7:00h: call [phone] Telephone: [phone]

## 2018-03-15 NOTE — Progress Notes (Signed)
Subjective:     Patient ID: Patrick Franco, male   DOB: [birth year], 84 y.o.   MRN: [id number]  PCP Lavone Orn, MD   HPI  PCP Lavone Orn, MD   HPI  PCP Lavone Orn, MD   IOV 12/08/2017  Chief Complaint  Patient presents with  . Advice Only    Referred by Dr. Laurann Montana due to SOB and an abn pft. Pt states SOB has been happening x3 years ago which has become worse.    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  He has chronic thoracic dextroscoliosis.  He is here for insidious onset of shortness of breath.  He tells me that he once a month at home and [place].  The home is at 10,000 feet.  Ever since he turned 84 years old when he is at this home where he spends the summer for 2 months he is always hypoxemic and short of breath climbing a flight of stairs.  He is having to use oxygen quite a bit there.  He does not have a problem at night because he uses a CPAP.  He says he never gets acclimate highest.  He is able to handle 7000 feet pretty well when he was fly fishing but when he gets to his home at 10,000 feet he has a problem.  He thinks the severity of this problem is getting worse slowly.  In addition he is also plays doubles tennis and he is getting more more short of breath in the last 3 years.  However for day-to-day exertion he does not have a problem.  He goes for daily walks but this is compounded by the fact he has back pain from his chronic dextroscoliosis in the thoracic region.  When he gets an epidural shot he is able to walk more   Previous imaging personally visualized.  Chest x-ray August 08, 2017 shows profound thoracic scoliosis.  This is also seen in a CT abdomen lung cut from 2017 that is associated with left-sided basal atelectasis.  In that lung cut I do not see any evidence of ILD.   He did not have ischemia on a cardiac stress test the same month. Echocardiogram May 2018: Ejection fraction 65%  with grade 1 diastolic dysfunction mild aortic stenosis.   Last hemoglobin check was in October 2016 and he was anemic at the time 9.8 g%. -According to the patient this happened when he had GI bleed.  He says since then this is resolved.  He believes his hemoglobin is being rechecked by his primary care physician and this should be normal because he is not aware that he is anemic anymore.  He says he will have his this rechecked by his primary care physician in the coming weeks.  Walking desat in office: did not desat 185 feet x 3 laps  OV 01/25/2018  Chief Complaint  Patient presents with  . Follow-up    HRCT done 12/27/17  Pt states he is still becoming SOB but then after he sits down to rest, he will be able to do other activities again. Denies  any complaints of cough or CP.    Here with wife to discuss below. Wife reports that albuterol during spirometry helped dyspnea. No new issues. REports being on CPAP for OSA. Does exercises such as yoga and pilates. Plays doubles tennis without moving due to dyspnea.    1. Tracheobronchomalacia. CT chest 12/27/17 2. Moderate patchy  air trapping in both lungs, indicative of small airways disease. 3. Scattered mild cylindrical and varicoid bronchiectasis in both lungs. Areas of postinfectious/postinflammatory scarring in the mid to lower lungs bilaterally. Otherwise no evidence of interstitial lung disease.-> #1, #2 and #3 can explain dyspnea 4. Borderline mild cardiomegaly. Dilated main pulmonary artery, suggesting pulmonary arterial hypertension. 5. Left main and 3 vessel coronary atherosclerosis.- not to worry already had normal stress test 6. Inferior left thyroid lobe 4.6 cm nodule, for which thyroid ultrasound correlation is warranted if not previously performed.- needs to talk to pcp 7. Diffuse hepatic steatosis.- talk to pcp 8. Stable right adrenal adenoma.  Aortic Atherosclerosis (ICD10-I70.0).    OV 03/15/2018  Chief Complaint   Patient presents with  . Follow-up    thyroid was biopsied yesterday, 5/1.  Pt is having a sonogram on heart in a couple weeks.  Still having some mild SOB. Denies any cough or CP. Pt started on Lipitor about 2 weeks ago by PCP and will occ become dizzy.     Follow-up dyspnea due to visceral obesity, tracheobronchomalacia, pulmonary air trapping and bronchomalacia without complication  Last visit we started incentive spirometry, encourage CPAP compliance and also flutter valve use during the day.  We also started inhaler Stiolto.  The combination of these measures have helped him somewhat.  He particularly gets significant benefit from the devices but not as much from the inhaler.  Nevertheless he wants to continue the inhaler.  We gave him a sample and he ran out of the sample approximately 1 week ago.  Despite not  taking inhaler he does not notice much worsening.  Nevertheless he wants to continue with inhaler.  He is worried about his coronary artery calcification.  I gave him the results at last visit but he does not remember this.  Nevertheless his primary care physician has ordered an echo and refer him to cardiology.  He did have a normal stress test in May 2018.  I shared all these results with him.     has a past medical history of Aortic stenosis, BPH (benign prostatic hyperplasia), Chronic low back pain, Depression, Diverticulitis, Elevated PSA, Foot drop, right, Foot drop, right, Gait disorder (02/03/2015), Hip pain, History of colonoscopy, HOH (hard of hearing), Hypertension, Lumbar radiculopathy, Mitral valve prolapse, Onychomycosis, Peptic ulcer disease, Sepsis ([place]) (2005), Sleep apnea, and Torn ACL.   reports that he quit smoking about 58 years ago. His smoking use included cigarettes. He started smoking about 59 years ago. He smoked 0.50 packs per day. He has never used smokeless tobacco.  Past Surgical History:  Procedure Laterality Date  . CATARACT EXTRACTION Bilateral   .  ESOPHAGOGASTRODUODENOSCOPY N/A 08/19/2015   Procedure: ESOPHAGOGASTRODUODENOSCOPY (EGD);  Surgeon: Laurence Spates, MD;  [facility]COPY;  Service: Endoscopy;  Laterality: N/A;  . right knee replacement Right 1996  . SHOULDER SURGERY    . TONSILLECTOMY      No Known Allergies  Immunization History  Administered Date(s) Administered  . Influenza Split 08/29/2012  . Influenza, High Dose Seasonal PF 09/10/2017  . Influenza,inj,Quad PF,6+ Mos 09/12/2013, 09/11/2015  . Pneumococcal Conjugate-13 01/13/2014  . Pneumococcal Polysaccharide-23 11/14/2004  . Tdap 01/18/2016    Family History  Problem Relation Age of Onset  . Pneumonia Mother 37  . Cancer - Lung Father 14  . Multiple myeloma Brother 66     Current Outpatient Medications:  .  allopurinol (ZYLOPRIM) 100 MG tablet, Take 1 tablet (100 mg total) by mouth daily., Disp:  90 tablet, Rfl: 3 .  atorvastatin (LIPITOR) 10 MG tablet, , Disp: , Rfl: 10 .  dutasteride (AVODART) 0.5 MG capsule, Take 0.5 mg by mouth daily., Disp: , Rfl: 2 .  esomeprazole (NEXIUM) 40 MG capsule, Take 1 capsule (40 mg total) by mouth 2 (two) times daily before a meal., Disp: 60 capsule, Rfl: 0 .  gabapentin (NEURONTIN) 400 MG capsule, Take 1 capsule (400 mg total) by mouth at bedtime., Disp: 30 capsule, Rfl: 3 .  indapamide (LOZOL) 2.5 MG tablet, Take 2.5 mg by mouth daily., Disp: , Rfl: 1 .  mirtazapine (REMERON) 30 MG tablet, TAKE ONE TABLET AT BEDTIME., Disp: 90 tablet, Rfl: 0 .  Multiple Vitamin (MULTIVITAMIN) tablet, Take 1 tablet by mouth daily., Disp: , Rfl:  .  Omega-3 Fatty Acids (FISH OIL PO), Take 1,000 mg by mouth daily., Disp: , Rfl:  .  Probiotic Product (PROBIOTIC DAILY PO), Take 1 tablet by mouth daily., Disp: , Rfl:  .  Respiratory Therapy Supplies (FLUTTER) DEVI, Use the flutter valve 10 times when doing it daily., Disp: 1 each, Rfl: 0 .  tamsulosin (FLOMAX) 0.4 MG CAPS capsule, Take 0.4 mg by mouth daily., Disp: , Rfl: 2 .  Tiotropium  Bromide-Olodaterol (STIOLTO RESPIMAT) 2.5-2.5 MCG/ACT AERS, Inhale 2 puffs into the lungs daily., Disp: 1 Inhaler, Rfl: 0 .  Turmeric 500 MG CAPS, Take 500 mg by mouth daily., Disp: , Rfl:  .  verapamil (CALAN-SR) 240 MG CR tablet, Take 240 mg by mouth daily., Disp: , Rfl: 2 .  Vitamin D, Ergocalciferol, (DRISDOL) 50000 units CAPS capsule, Take 1 capsule (50,000 Units total) by mouth once a week., Disp: 12 capsule, Rfl: 3 .  colchicine 0.6 MG tablet, Take 1 tablet (0.6 mg total) by mouth 2 (two) times daily as needed. (Patient not taking: Reported on 01/25/2018), Disp: 30 tablet, Rfl: 2   Review of Systems     Objective:   Physical Exam  Vitals:   03/15/18 0900  BP: 118/70  Pulse: 77  SpO2: 98%  Weight: 209 lb 3.2 oz (94.9 kg)  Height: '5\' 10"'$  (1.778 m)    Estimated body mass index is 30.02 kg/m as calculated from the following:   Height as of this encounter: '5\' 10"'$  (1.778 m).   Weight as of this encounter: 209 lb 3.2 oz (94.9 kg).   Discussion only visit    Assessment:       ICD-10-CM   1. Tracheobronchomalacia J39.8   2. Pulmonary air trapping R09.89   3. Bronchiectasis without complication ([place]) E52.7   4. Shortness of breath R06.02        Plan:     Tracheobronchomalacia Pulmonary air trapping Bronchiectasis without complication ([place]) -  Above reasons causing shortness of breath. It seems the devices are helping and the inhaler somewhat  PLAN  - continue stiolto - take sample and refill - use incentive spirometry during day time 10 times daily  - use flutter valve during day time 10 times daily - use CPAP at night - continue yoga, pilates and exercises - if no response to above, can consider [facility] v [facility] v both again in future - Have sent message to cardiology to look for heart issues relatd to shortness of breath  Followup 6 months or sooner if needed   (> 50% of this 15 min visit spent in face to face counseling or/and coordination of  care)   Dr. Brand Males, M.D., [facility].C.P Pulmonary and Critical Care Medicine  Staff Physician, [place] Director - Interstitial Lung Disease  Program  Pulmonary [place] at [place], [place], [address]  Pager: [phone], If no answer or between  15:00h - 7:00h: call [phone] Telephone: [phone]

## 2018-03-15 NOTE — Addendum Note (Signed)
Addended by: Vivia Ewing on: 03/15/2018 09:38 AM   Modules accepted: Orders

## 2018-03-15 NOTE — Patient Instructions (Addendum)
Tracheobronchomalacia Pulmonary air trapping Bronchiectasis without complication ([place]) -  Above reasons causing shortness of breath. It seems the devices are helping and the inhaler somewhat  PLAN  - continue stiolto - take sample and refill - use incentive spirometry during day time 10 times daily  - use flutter valve during day time 10 times daily - use CPAP at night - continue yoga, pilates and exercises - if no response to above, [facility] in future - Have sent message to cardiology to look for heart issues relatd to shortness of breath  Followup 6 months or sooner if needed

## 2018-03-15 NOTE — Telephone Encounter (Signed)
thanks

## 2018-03-26 ENCOUNTER — Other Ambulatory Visit: Payer: Self-pay

## 2018-03-26 ENCOUNTER — Ambulatory Visit (HOSPITAL_COMMUNITY): Payer: Medicare Other | Attending: Cardiology

## 2018-03-26 DIAGNOSIS — I350 Nonrheumatic aortic (valve) stenosis: Secondary | ICD-10-CM

## 2018-03-26 DIAGNOSIS — R0600 Dyspnea, unspecified: Secondary | ICD-10-CM

## 2018-03-26 DIAGNOSIS — I080 Rheumatic disorders of both mitral and aortic valves: Secondary | ICD-10-CM | POA: Insufficient documentation

## 2018-03-26 DIAGNOSIS — I119 Hypertensive heart disease without heart failure: Secondary | ICD-10-CM | POA: Insufficient documentation

## 2018-03-26 DIAGNOSIS — G4730 Sleep apnea, unspecified: Secondary | ICD-10-CM | POA: Insufficient documentation

## 2018-03-28 ENCOUNTER — Ambulatory Visit: Payer: Medicare Other | Admitting: Cardiovascular Disease

## 2018-04-03 DIAGNOSIS — Z85828 Personal history of other malignant neoplasm of skin: Secondary | ICD-10-CM | POA: Diagnosis not present

## 2018-04-03 DIAGNOSIS — D225 Melanocytic nevi of trunk: Secondary | ICD-10-CM | POA: Diagnosis not present

## 2018-04-03 DIAGNOSIS — N401 Enlarged prostate with lower urinary tract symptoms: Secondary | ICD-10-CM | POA: Diagnosis not present

## 2018-04-03 DIAGNOSIS — L570 Actinic keratosis: Secondary | ICD-10-CM | POA: Diagnosis not present

## 2018-04-03 DIAGNOSIS — R350 Frequency of micturition: Secondary | ICD-10-CM | POA: Diagnosis not present

## 2018-04-03 DIAGNOSIS — L821 Other seborrheic keratosis: Secondary | ICD-10-CM | POA: Diagnosis not present

## 2018-04-03 DIAGNOSIS — N402 Nodular prostate without lower urinary tract symptoms: Secondary | ICD-10-CM | POA: Diagnosis not present

## 2018-04-03 DIAGNOSIS — D1801 Hemangioma of skin and subcutaneous tissue: Secondary | ICD-10-CM | POA: Diagnosis not present

## 2018-04-03 DIAGNOSIS — L723 Sebaceous cyst: Secondary | ICD-10-CM | POA: Diagnosis not present

## 2018-04-05 DIAGNOSIS — M545 Low back pain: Secondary | ICD-10-CM | POA: Diagnosis not present

## 2018-04-05 DIAGNOSIS — M1712 Unilateral primary osteoarthritis, left knee: Secondary | ICD-10-CM | POA: Diagnosis not present

## 2018-04-05 DIAGNOSIS — M5416 Radiculopathy, lumbar region: Secondary | ICD-10-CM | POA: Diagnosis not present

## 2018-04-05 DIAGNOSIS — M419 Scoliosis, unspecified: Secondary | ICD-10-CM | POA: Diagnosis not present

## 2018-04-06 ENCOUNTER — Other Ambulatory Visit: Payer: Self-pay | Admitting: Family Medicine

## 2018-04-12 DIAGNOSIS — M545 Low back pain: Secondary | ICD-10-CM | POA: Diagnosis not present

## 2018-04-12 DIAGNOSIS — M1712 Unilateral primary osteoarthritis, left knee: Secondary | ICD-10-CM | POA: Diagnosis not present

## 2018-04-12 DIAGNOSIS — M419 Scoliosis, unspecified: Secondary | ICD-10-CM | POA: Diagnosis not present

## 2018-04-12 DIAGNOSIS — M5416 Radiculopathy, lumbar region: Secondary | ICD-10-CM | POA: Diagnosis not present

## 2018-04-24 ENCOUNTER — Ambulatory Visit (INDEPENDENT_AMBULATORY_CARE_PROVIDER_SITE_OTHER): Payer: Medicare Other | Admitting: Cardiovascular Disease

## 2018-04-24 ENCOUNTER — Encounter: Payer: Self-pay | Admitting: Cardiovascular Disease

## 2018-04-24 ENCOUNTER — Encounter (INDEPENDENT_AMBULATORY_CARE_PROVIDER_SITE_OTHER): Payer: Self-pay

## 2018-04-24 VITALS — BP 136/70 | HR 85 | Ht 70.0 in | Wt 214.0 lb

## 2018-04-24 DIAGNOSIS — I491 Atrial premature depolarization: Secondary | ICD-10-CM

## 2018-04-24 DIAGNOSIS — I350 Nonrheumatic aortic (valve) stenosis: Secondary | ICD-10-CM

## 2018-04-24 DIAGNOSIS — I2510 Atherosclerotic heart disease of native coronary artery without angina pectoris: Secondary | ICD-10-CM | POA: Diagnosis not present

## 2018-04-24 DIAGNOSIS — I340 Nonrheumatic mitral (valve) insufficiency: Secondary | ICD-10-CM

## 2018-04-24 NOTE — Patient Instructions (Signed)
Medication Instructions:  Your physician recommends that you continue on your current medications as directed. Please refer to the Current Medication list given to you today.  Labwork: None  Testing/Procedures: None  Follow-Up: Your physician wants you to follow-up in: 6 months with Dr. Angelena Form.  You will receive a reminder letter in the mail two months in advance. If you don't receive a letter, please call our office to schedule the follow-up appointment.   Any Other Special Instructions Will Be Listed Below (If Applicable).     If you need a refill on your cardiac medications before your next appointment, please call your pharmacy.

## 2018-04-24 NOTE — Progress Notes (Signed)
Chief Complaint  Patient presents with  . Follow-up    aortic stenosis   History of Present Illness: 84 yo male with history of CAD, aortic stenosis, HTN, mitral valve prolapse, sleep apnea here today for cardiac follow up. I saw him as a new consult for evaluation of aortic stenosis and abnormal EKG in May 2018. He was having a colonoscopy on 04/04/17 and was found to have an irregular heart rhythm. He was sent to his primary care office where an EKG showed sinus rhythm with PACs. Echo November 2017 with normal LV systolic function, WOEH=21-22%, grade 1 diastolic dysfunction, functionally bicuspid aortic valve with moderate stenosis (mean gradient 21 mmHg, AVA 1.4cm2). There was mild mitral regurgitation. At his first visit here in May 2018, he described dyspnea and less energy but no chest pain or dizziness. Nuclear stress test May 2018 with no ischemia. Echo May 2019 showed normal LV systolic function, mild aortic valve stenosis (mean gradient 16 mmHg), moderate AI and mild mitral valve regurgitation with MVP. He has been started on ASA and a statin due to finding of CAD on the chest CT.   He is here today for follow up. The patient denies any chest pain, palpitations, lower extremity edema, orthopnea, PND, dizziness, near syncope or syncope. He has no change in baseline mild dyspnea with exertion. This has not changed over the past year. No chest pressures with activity.   Primary Care Physician: Lavone Orn, MD   Past Medical History:  Diagnosis Date  . Aortic stenosis   . BPH (benign prostatic hyperplasia)   . Chronic low back pain   . Depression   . Diverticulitis   . Elevated PSA   . Foot drop, right   . Foot drop, right   . Gait disorder 02/03/2015  . Hip pain   . History of colonoscopy   . HOH (hard of hearing)    hearing aid  . Hypertension   . Lumbar radiculopathy   . Mitral valve prolapse   . Onychomycosis   . Peptic ulcer disease   . Sepsis ([place]) 2005   e.  coli-after prostate bx  . Sleep apnea   . Torn ACL     Past Surgical History:  Procedure Laterality Date  . CATARACT EXTRACTION Bilateral   . ESOPHAGOGASTRODUODENOSCOPY N/A 08/19/2015   Procedure: ESOPHAGOGASTRODUODENOSCOPY (EGD);  Surgeon: Laurence Spates, MD;  Location: [facility] ENDOSCOPY;  Service: Endoscopy;  Laterality: N/A;  . right knee replacement Right 1996  . SHOULDER SURGERY    . TONSILLECTOMY      Current Outpatient Medications  Medication Sig Dispense Refill  . allopurinol (ZYLOPRIM) 100 MG tablet Take 1 tablet (100 mg total) by mouth daily. 90 tablet 3  . atorvastatin (LIPITOR) 10 MG tablet   10  . colchicine 0.6 MG tablet Take 1 tablet (0.6 mg total) by mouth 2 (two) times daily as needed. 30 tablet 2  . dutasteride (AVODART) 0.5 MG capsule Take 0.5 mg by mouth daily.  2  . esomeprazole (NEXIUM) 40 MG capsule Take 1 capsule (40 mg total) by mouth 2 (two) times daily before a meal. 60 capsule 0  . gabapentin (NEURONTIN) 400 MG capsule Take 1 capsule (400 mg total) by mouth at bedtime. 30 capsule 3  . indapamide (LOZOL) 2.5 MG tablet Take 2.5 mg by mouth daily.  1  . mirtazapine (REMERON) 30 MG tablet TAKE ONE TABLET AT BEDTIME. 90 tablet 0  . Multiple Vitamin (MULTIVITAMIN) tablet Take 1 tablet by  mouth daily.    . Omega-3 Fatty Acids (FISH OIL PO) Take 1,000 mg by mouth daily.    . Probiotic Product (PROBIOTIC DAILY PO) Take 1 tablet by mouth daily.    [name] Respiratory Therapy Supplies (FLUTTER) DEVI Use the flutter valve 10 times when doing it daily. 1 each 0  . tamsulosin (FLOMAX) 0.4 MG CAPS capsule Take 0.4 mg by mouth daily.  2  . Tiotropium Bromide-Olodaterol (STIOLTO RESPIMAT) 2.5-2.5 MCG/ACT AERS Inhale 2 puffs into the lungs daily. 1 Inhaler 5  . Tiotropium Bromide-Olodaterol (STIOLTO RESPIMAT) 2.5-2.5 MCG/ACT AERS Inhale 2 puffs into the lungs daily. 1 Inhaler 0  . Turmeric 500 MG CAPS Take 500 mg by mouth daily.    . verapamil (CALAN-SR) 240 MG CR tablet Take 240 mg by  mouth daily.  2  . Vitamin D, Ergocalciferol, (DRISDOL) 50000 units CAPS capsule Take 1 capsule (50,000 Units total) by mouth once a week. 12 capsule 3   No current facility-administered medications for this visit.     No Known Allergies  Social History   Socioeconomic History  . Marital status: Married    Spouse name: Not on file  . Number of children: 2  . Years of education: Not on file  . Highest education level: Not on file  Occupational History  . Occupation: Retired Social research officer, government  Social Needs  . Financial resource strain: Not on file  . Food insecurity:    Worry: Not on file    Inability: Not on file  . Transportation needs:    Medical: Not on file    Non-medical: Not on file  Tobacco Use  . Smoking status: Former Smoker    Packs/day: 0.50    Types: Cigarettes    Start date: 1960    Last attempt to quit: 1961    Years since quitting: 58.4  . Smokeless tobacco: Never Used  . 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 Alcohol use: Not on file  . Drug use: No  . Sexual activity: Yes  Lifestyle  . Physical activity:    Days per week: Not on file    Minutes per session: Not on file  . Stress: Not on file  Relationships  . Social connections:    Talks on phone: Not on file    Gets together: Not on file    Attends religious service: Not on file    Active member of club or organization: Not on file    Attends meetings of clubs or organizations: Not on file    Relationship status: Not on file  . Intimate partner violence:    Fear of current or ex partner: Not on file    Emotionally abused: Not on file    Physically abused: Not on file    Forced sexual activity: Not on file  Other Topics Concern  . Not on file  Social History Narrative   Patient is right handed.   Patient drinks 1 cup of coffee per day.    Family History  Problem Relation Age of Onset  . Pneumonia Mother 838 . Cancer - Lung Father 552 .  Multiple myeloma Brother 540   Review of Systems:  As stated in the HPI and otherwise negative.   BP 136/70   Pulse 85   Ht '5\' 10"'$  (1.778 m)   Wt 214 lb (97.1 kg)   SpO2 97%   BMI 30.71 kg/m  Physical Examination:  General: Well developed, well nourished, NAD  HEENT: OP clear, mucus membranes moist  SKIN: warm, dry. No rashes. Neuro: No focal deficits  Musculoskeletal: Muscle strength 5/5 all ext  Psychiatric: Mood and affect normal  Neck: No JVD, no carotid bruits, no thyromegaly, no lymphadenopathy.  Lungs:Clear bilaterally, no wheezes, rhonci, crackles Cardiovascular: Regular rate and rhythm. No murmurs, gallops or rubs. Abdomen:Soft. Bowel sounds present. Non-tender.  Extremities: No lower extremity edema. Pulses are 2 + in the bilateral DP/PT.  Echo May 2019:  Left ventricle: The cavity size was normal. There was severe   focal basal and moderate concentric hypertrophy. Systolic   function was normal. The estimated ejection fraction was in the   range of 60% to 65%. Wall motion was normal; there were no   regional wall motion abnormalities. There was an increased   relative contribution of atrial contraction to ventricular   filling. Doppler parameters are consistent with abnormal left   ventricular relaxation (grade 1 diastolic dysfunction). Doppler   parameters are consistent with high ventricular filling pressure. - Aortic valve: Moderately calcified annulus. Trileaflet;   moderately thickened, moderately calcified leaflets. Valve   mobility was restricted. There was mild to moderate stenosis.   There was moderate regurgitation. Regurgitation pressure   half-time: 354 ms. - Aorta: Aortic root dimension: 39 mm (ED). - Aortic root: The aortic root was mildly dilated. - Mitral valve: Mild, late systolicprolapse, involving the anterior   leaflet and the posterior leaflet. There was mild regurgitation. - Left atrium: The atrium was moderately dilated. - Right  ventricle: The cavity size was mildly dilated. Wall   thickness was normal. - Atrial septum: There was increased thickness of the septum,   consistent with lipomatous hypertrophy.  EKG:  EKG is ordered today. The ekg ordered today demonstrates sinus rhythm, 1st degree AV block. PAC  Recent Labs: No results found for requested labs within last 8760 hours.   Lipid Panel No results found for: CHOL, TRIG, HDL, CHOLHDL, VLDL, LDLCALC, LDLDIRECT   Wt Readings from Last 3 Encounters:  04/24/18 214 lb (97.1 kg)  03/15/18 209 lb 3.2 oz (94.9 kg)  01/25/18 206 lb 12.8 oz (93.8 kg)     Other studies Reviewed: Additional studies/ records that were reviewed today include: . Review of the above records demonstrates:   Assessment and Plan:   1. Aortic stenosis/aortic insufficiency: Mild to moderate by echo May 2019 (mean gradient 16 mmHg). There is moderate AI. Repeat echo one year  2. Premature atrial contractions: No palpitations. Continue verapamil  3. Mitral regurgitation: Mild by echo May 2019   4. CAD without angina: He has had no cardiac cath. Chest CT in February 2019 with finding of coronary atherosclerosis. Nuclear stress test in 2018 with no ischemia. I do not think his dyspnea is related to ischemic heart disease but I have discussed a cardiac cath to exclude obstructive CAD if his dyspnea worsens. He is now on ASA and statin.   Current medicines are reviewed at length with the patient today.  The patient does not have concerns regarding medicines.  The following changes have been made:  no change  Labs/ tests ordered today include:   Orders Placed This Encounter  Procedures  . EKG 12-Lead     Disposition:   FU with me in 6 months   Signed, [name], MD 04/24/2018 9:51 AM    [facility] [place], [place], [place]  [address] Phone: [phone])  [phone]; Fax: [phone]

## 2018-04-25 DIAGNOSIS — M5416 Radiculopathy, lumbar region: Secondary | ICD-10-CM | POA: Diagnosis not present

## 2018-04-25 DIAGNOSIS — M419 Scoliosis, unspecified: Secondary | ICD-10-CM | POA: Diagnosis not present

## 2018-04-25 DIAGNOSIS — M545 Low back pain: Secondary | ICD-10-CM | POA: Diagnosis not present

## 2018-04-25 DIAGNOSIS — Z822 Family history of deafness and hearing loss: Secondary | ICD-10-CM | POA: Diagnosis not present

## 2018-04-25 DIAGNOSIS — M1712 Unilateral primary osteoarthritis, left knee: Secondary | ICD-10-CM | POA: Diagnosis not present

## 2018-04-25 DIAGNOSIS — H903 Sensorineural hearing loss, bilateral: Secondary | ICD-10-CM | POA: Diagnosis not present

## 2018-04-25 DIAGNOSIS — Z77122 Contact with and (suspected) exposure to noise: Secondary | ICD-10-CM | POA: Diagnosis not present

## 2018-05-03 DIAGNOSIS — M1712 Unilateral primary osteoarthritis, left knee: Secondary | ICD-10-CM | POA: Diagnosis not present

## 2018-05-03 DIAGNOSIS — M5416 Radiculopathy, lumbar region: Secondary | ICD-10-CM | POA: Diagnosis not present

## 2018-05-03 DIAGNOSIS — M419 Scoliosis, unspecified: Secondary | ICD-10-CM | POA: Diagnosis not present

## 2018-05-03 DIAGNOSIS — M545 Low back pain: Secondary | ICD-10-CM | POA: Diagnosis not present

## 2018-05-15 ENCOUNTER — Other Ambulatory Visit: Payer: Self-pay | Admitting: Family Medicine

## 2018-05-15 NOTE — Telephone Encounter (Signed)
Refill done.  

## 2018-05-18 DIAGNOSIS — M1712 Unilateral primary osteoarthritis, left knee: Secondary | ICD-10-CM | POA: Diagnosis not present

## 2018-05-18 DIAGNOSIS — M419 Scoliosis, unspecified: Secondary | ICD-10-CM | POA: Diagnosis not present

## 2018-05-18 DIAGNOSIS — M545 Low back pain: Secondary | ICD-10-CM | POA: Diagnosis not present

## 2018-05-18 DIAGNOSIS — M5416 Radiculopathy, lumbar region: Secondary | ICD-10-CM | POA: Diagnosis not present

## 2018-05-23 ENCOUNTER — Other Ambulatory Visit: Payer: Self-pay

## 2018-05-23 DIAGNOSIS — M5416 Radiculopathy, lumbar region: Secondary | ICD-10-CM

## 2018-05-24 DIAGNOSIS — M419 Scoliosis, unspecified: Secondary | ICD-10-CM | POA: Diagnosis not present

## 2018-05-24 DIAGNOSIS — M5416 Radiculopathy, lumbar region: Secondary | ICD-10-CM | POA: Diagnosis not present

## 2018-05-24 DIAGNOSIS — M545 Low back pain: Secondary | ICD-10-CM | POA: Diagnosis not present

## 2018-05-24 DIAGNOSIS — M1712 Unilateral primary osteoarthritis, left knee: Secondary | ICD-10-CM | POA: Diagnosis not present

## 2018-05-30 ENCOUNTER — Other Ambulatory Visit: Payer: Self-pay | Admitting: Family Medicine

## 2018-05-31 ENCOUNTER — Ambulatory Visit
Admission: RE | Admit: 2018-05-31 | Discharge: 2018-05-31 | Disposition: A | Discharge status: Home / Self Care | Payer: Medicare Other | Source: Ambulatory Visit | Attending: Family Medicine | Admitting: Family Medicine

## 2018-05-31 ENCOUNTER — Other Ambulatory Visit: Payer: Self-pay | Admitting: Family Medicine

## 2018-05-31 DIAGNOSIS — M545 Low back pain: Secondary | ICD-10-CM | POA: Diagnosis not present

## 2018-05-31 DIAGNOSIS — M5416 Radiculopathy, lumbar region: Secondary | ICD-10-CM

## 2018-05-31 MED ORDER — METHYLPREDNISOLONE ACETATE 40 MG/ML INJ SUSP (RADIOLOG
120.0000 mg | Freq: Once | INTRAMUSCULAR | Status: AC
  Administered 2018-05-31: 120 mg via EPIDURAL

## 2018-05-31 MED ORDER — IOPAMIDOL (ISOVUE-M 200) INJECTION 41%
1.0000 mL | Freq: Once | INTRAMUSCULAR | Status: AC
  Administered 2018-05-31: 1 mL via EPIDURAL

## 2018-05-31 NOTE — Telephone Encounter (Signed)
Refill done.  

## 2018-06-04 ENCOUNTER — Other Ambulatory Visit (HOSPITAL_COMMUNITY): Payer: Medicare Other

## 2018-06-04 DIAGNOSIS — R300 Dysuria: Secondary | ICD-10-CM | POA: Diagnosis not present

## 2018-06-07 DIAGNOSIS — M545 Low back pain: Secondary | ICD-10-CM | POA: Diagnosis not present

## 2018-06-07 DIAGNOSIS — M5416 Radiculopathy, lumbar region: Secondary | ICD-10-CM | POA: Diagnosis not present

## 2018-06-07 DIAGNOSIS — M1712 Unilateral primary osteoarthritis, left knee: Secondary | ICD-10-CM | POA: Diagnosis not present

## 2018-06-07 DIAGNOSIS — M419 Scoliosis, unspecified: Secondary | ICD-10-CM | POA: Diagnosis not present

## 2018-08-21 ENCOUNTER — Other Ambulatory Visit: Payer: Self-pay | Admitting: Family Medicine

## 2018-08-21 DIAGNOSIS — Z23 Encounter for immunization: Secondary | ICD-10-CM | POA: Diagnosis not present

## 2018-08-21 DIAGNOSIS — J479 Bronchiectasis, uncomplicated: Secondary | ICD-10-CM | POA: Diagnosis not present

## 2018-08-21 DIAGNOSIS — N401 Enlarged prostate with lower urinary tract symptoms: Secondary | ICD-10-CM | POA: Diagnosis not present

## 2018-08-21 DIAGNOSIS — I10 Essential (primary) hypertension: Secondary | ICD-10-CM | POA: Diagnosis not present

## 2018-08-21 DIAGNOSIS — I2510 Atherosclerotic heart disease of native coronary artery without angina pectoris: Secondary | ICD-10-CM | POA: Diagnosis not present

## 2018-08-21 DIAGNOSIS — J398 Other specified diseases of upper respiratory tract: Secondary | ICD-10-CM | POA: Diagnosis not present

## 2018-08-21 NOTE — Telephone Encounter (Signed)
Refill done.  

## 2018-09-12 ENCOUNTER — Ambulatory Visit (INDEPENDENT_AMBULATORY_CARE_PROVIDER_SITE_OTHER): Payer: Medicare Other | Admitting: Internal Medicine

## 2018-09-12 ENCOUNTER — Encounter: Payer: Self-pay | Admitting: Internal Medicine

## 2018-09-12 VITALS — BP 136/80 | HR 84 | Ht 69.5 in | Wt 213.0 lb

## 2018-09-12 DIAGNOSIS — I2510 Atherosclerotic heart disease of native coronary artery without angina pectoris: Secondary | ICD-10-CM | POA: Diagnosis not present

## 2018-09-12 DIAGNOSIS — J398 Other specified diseases of upper respiratory tract: Secondary | ICD-10-CM | POA: Diagnosis not present

## 2018-09-12 DIAGNOSIS — R0602 Shortness of breath: Secondary | ICD-10-CM | POA: Diagnosis not present

## 2018-09-12 NOTE — Patient Instructions (Signed)
ICD-10-CM   1. Tracheobronchomalacia J39.8   2. Shortness of breath R06.02     Stable but symptomatic  Plan Start weight loss program Continue stiolto daily  followup 3 months; if not improved can get 2nd opinion at [facility]

## 2018-09-12 NOTE — Progress Notes (Signed)
PCP Lavone Orn, MD   HPI  PCP Lavone Orn, MD   HPI  PCP Lavone Orn, MD   IOV 12/08/2017  Chief Complaint  Patient presents with  . Advice Only    Referred by Dr. Laurann Montana due to SOB and an abn pft. Pt states SOB has been happening x3 years ago which has become worse.    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  He has chronic thoracic dextroscoliosis.  He is here for insidious onset of shortness of breath.  He tells me that he once a month at home and [place].  The home is [place],000 [place].  Ever since he turned 84 years old when he is at this home where he spends the summer for 2 months he is always hypoxemic and short of breath climbing a flight of stairs.  He is having to use oxygen quite a bit there.  He does not have a problem at night because he uses a CPAP.  He says he never gets acclimate highest.  He is able to handle 7000 feet pretty well when he was fly fishing but when he gets to his home at 10,000 feet he has a problem.  He thinks the severity of this problem is getting worse slowly.  In addition he is also plays doubles tennis and he is getting more more short of breath in the last 3 years.  However for day-to-day exertion he does not have a problem.  He goes for daily walks but this is compounded by the fact he has back pain from his chronic dextroscoliosis in the thoracic region.  When he gets an epidural shot he is able to walk more   Previous imaging personally visualized.  Chest x-ray August 08, 2017 shows profound thoracic scoliosis.  This is also seen in a CT abdomen lung cut from 2017 that is associated with left-sided basal atelectasis.  In that lung cut I do not see any evidence of ILD.   He did not have ischemia on a cardiac stress test the same month. Echocardiogram May 2018: Ejection fraction 65% with grade 1 diastolic dysfunction mild aortic stenosis.   Last hemoglobin check was in  October 2016 and he was anemic at the time 9.8 g%. -According to the patient this happened when he had GI bleed.  He says since then this is resolved.  He believes his hemoglobin is being rechecked by his primary care physician and this should be normal because he is not aware that he is anemic anymore.  He says he will have his this rechecked by his primary care physician in the coming weeks.  Walking desat in office: did not desat 185 feet x 3 laps  OV 01/25/2018  Chief Complaint  Patient presents with  . Follow-up    HRCT done 12/27/17  Pt states he is still becoming SOB but then after he sits down to rest, he will be able to do other activities again. Denies  any complaints of cough or CP.    Here with wife to discuss below. Wife reports that albuterol during spirometry helped dyspnea. No new issues. REports being on CPAP for OSA. Does exercises such as yoga and pilates. Plays doubles tennis without moving due to dyspnea.    1. Tracheobronchomalacia. CT chest 12/27/17 2. Moderate patchy air trapping in both lungs, indicative of small airways disease. 3. Scattered mild cylindrical and varicoid bronchiectasis in both lungs. Areas  of postinfectious/postinflammatory scarring in the mid to lower lungs bilaterally. Otherwise no evidence of interstitial lung disease.-> #1, #2 and #3 can explain dyspnea 4. Borderline mild cardiomegaly. Dilated main pulmonary artery, suggesting pulmonary arterial hypertension. 5. Left main and 3 vessel coronary atherosclerosis.- not to worry already had normal stress test 6. Inferior left thyroid lobe 4.6 cm nodule, for which thyroid ultrasound correlation is warranted if not previously performed.- needs to talk to pcp 7. Diffuse hepatic steatosis.- talk to pcp 8. Stable right adrenal adenoma.  Aortic Atherosclerosis (ICD10-I70.0).    OV 03/15/2018  Chief Complaint  Patient presents with  . Follow-up    thyroid was biopsied yesterday, 5/1.  Pt is  having a sonogram on heart in a couple weeks.  Still having some mild SOB. Denies any cough or CP. Pt started on Lipitor about 2 weeks ago by PCP and will occ become dizzy.     Follow-up dyspnea due to visceral obesity, tracheobronchomalacia, pulmonary air trapping and bronchomalacia without complication  Last visit we started incentive spirometry, encourage CPAP compliance and also flutter valve use during the day.  We also started inhaler Stiolto.  The combination of these measures have helped him somewhat.  He particularly gets significant benefit from the devices but not as much from the inhaler.  Nevertheless he wants to continue the inhaler.  We gave him a sample and he ran out of the sample approximately 1 week ago.  Despite not  taking inhaler he does not notice much worsening.  Nevertheless he wants to continue with inhaler.  He is worried about his coronary artery calcification.  I gave him the results at last visit but he does not remember this.  Nevertheless his primary care physician has ordered an echo and refer him to cardiology.  He did have a normal stress test in May 2018.  I shared all these results with him.    OV 09/12/2018  Subjective:  Patient ID: Patrick Franco, male , DOB: June 23, 1932 , age 86 y.o. , MRN: [id number] , ADDRESS: [address] [place] [address]   09/12/2018 -   Chief Complaint  Patient presents with  . Follow-up    worsened SOB. unable to perform activities     HPI Patrick Franco 84 y.o. - bronchomalacia with multofactorial dyspnea -  visceral obesity, tracheobronchomalacia, pulmonary air trapping and bronchomalacia without complication   Returns for follow-up.  Since his last visit he spends 2 months in [place], [place].  He says he is extremely dyspneic.  It was an elevation of 10,000 feet.  He makes annual trips because he owns a home there.  He said he cannot do anything and is very dyspneic.  His pulse ox was in the low 80s.  He has not sold  his home there and return.  He says he is gained a lot of weight in the last year.  His current weight is 212 pounds.  He understands that he needs to lose weight.  While in [place] he did not use his Stiolto.  Since then he is restarted.  Nevertheless even if he plays 15 minutes of tennis he is extremely short of breath.  There is no wheezing or cough.  We [facility] referral for the tracheal bronchomalacia but he wants to hold off.  He first wants to try losing weight.[name]  He is also bothered by lumbar stenosis.  He continues with yoga and Pilates.  He is up-to-date with his flu shot.  ROS - per HPI       has a past medical history of Aortic stenosis, BPH (benign prostatic hyperplasia), Chronic low back pain, Depression, Diverticulitis, Elevated PSA, Foot drop, right, Foot drop, right, Gait disorder (02/03/2015), Hip pain, History of colonoscopy, HOH (hard of hearing), Hypertension, Lumbar radiculopathy, Mitral valve prolapse, Onychomycosis, Peptic ulcer disease, Sepsis ([place]) (2005), Sleep apnea, and Torn ACL.   reports that he quit smoking about 58 years ago. His smoking use included cigarettes. He started smoking about 59 years ago. He smoked 0.50 packs per day. He has never used smokeless tobacco.  Past Surgical History:  Procedure Laterality Date  . CATARACT EXTRACTION Bilateral   . ESOPHAGOGASTRODUODENOSCOPY N/A 08/19/2015   Procedure: ESOPHAGOGASTRODUODENOSCOPY (EGD);  Surgeon: Laurence Spates, MD;  Location[facility] ENDOSCOPY;  Service: Endoscopy;  Laterality: N/A;  . right knee replacement Right 1996  . SHOULDER SURGERY    . TONSILLECTOMY      No Known Allergies  Immunization History  Administered Date(s) Administered  . Influenza Split 08/29/2012  . Influenza, High Dose Seasonal PF 09/10/2017  . Influenza,inj,Quad PF,6+ Mos 09/12/2013, 09/11/2015  . Pneumococcal Conjugate-13 01/13/2014  . Pneumococcal Polysaccharide-23 11/14/2004  . Tdap 01/18/2016    Family History    Problem Relation Age of Onset  . Pneumonia Mother 63  . Cancer - Lung Father 38  . Multiple myeloma Brother 67     Current Outpatient Medications:  .  allopurinol (ZYLOPRIM) 100 MG tablet, Take 1 tablet (100 mg total) by mouth daily., Disp: 90 tablet, Rfl: 3 .  atorvastatin (LIPITOR) 10 MG tablet, , Disp: , Rfl: 10 .  colchicine 0.6 MG tablet, Take 1 tablet (0.6 mg total) by mouth 2 (two) times daily as needed., Disp: 30 tablet, Rfl: 2 .  dutasteride (AVODART) 0.5 MG capsule, Take 0.5 mg by mouth daily., Disp: , Rfl: 2 .  esomeprazole (NEXIUM) 40 MG capsule, Take 1 capsule (40 mg total) by mouth 2 (two) times daily before a meal., Disp: 60 capsule, Rfl: 0 .  gabapentin (NEURONTIN) 400 MG capsule, TAKE 1 CAPSULE AT BEDTIME., Disp: 90 capsule, Rfl: 1 .  indapamide (LOZOL) 2.5 MG tablet, Take 2.5 mg by mouth daily., Disp: , Rfl: 1 .  mirtazapine (REMERON) 30 MG tablet, TAKE ONE TABLET AT BEDTIME., Disp: 90 tablet, Rfl: 0 .  Multiple Vitamin (MULTIVITAMIN) tablet, Take 1 tablet by mouth daily., Disp: , Rfl:  .  Omega-3 Fatty Acids (FISH OIL PO), Take 1,000 mg by mouth daily., Disp: , Rfl:  .  Probiotic Product (PROBIOTIC DAILY PO), Take 1 tablet by mouth daily., Disp: , Rfl:  .  Respiratory Therapy Supplies (FLUTTER) DEVI, Use the flutter valve 10 times when doing it daily., Disp: 1 each, Rfl: 0 .  tamsulosin (FLOMAX) 0.4 MG CAPS capsule, Take 0.4 mg by mouth daily., Disp: , Rfl: 2 .  Tiotropium Bromide-Olodaterol (STIOLTO RESPIMAT) 2.5-2.5 MCG/ACT AERS, Inhale 2 puffs into the lungs daily., Disp: 1 Inhaler, Rfl: 5 .  Tiotropium Bromide-Olodaterol (STIOLTO RESPIMAT) 2.5-2.5 MCG/ACT AERS, Inhale 2 puffs into the lungs daily., Disp: 1 Inhaler, Rfl: 0 .  Turmeric 500 MG CAPS, Take 500 mg by mouth daily., Disp: , Rfl:  .  verapamil (CALAN-SR) 240 MG CR tablet, Take 240 mg by mouth daily., Disp: , Rfl: 2 .  Vitamin D, Ergocalciferol, (DRISDOL) 50000 units CAPS capsule, TAKE 1 CAPSULE WEEKLY., Disp:  12 capsule, Rfl: 0      Objective:   Vitals:   09/12/18 0900  BP: 136/80  Pulse: 84  SpO2: 94%  Weight: 213 lb (96.6 kg)  Height: 5' 9.5" (1.765 m)    Estimated body mass index is 31 kg/m as calculated from the following:   Height as of this encounter: 5' 9.5" (1.765 m).   Weight as of this encounter: 213 lb (96.6 kg).  _0 @  Filed Weights   09/12/18 0900  Weight: 213 lb (96.6 kg)     Physical Exam  General Appearance:    Alert, cooperative, no distress, appears stated age - yes , Deconditioned looking - no , OBESE  - yes, Sitting on Wheelchair -  no  Head:    Normocephalic, without obvious abnormality, atraumatic  Eyes:    PERRL, conjunctiva/corneas clear,  Ears:    Normal TM's and external ear canals, both ears  Nose:   Nares normal, septum midline, mucosa normal, no drainage    or sinus tenderness. OXYGEN ON  - no . Patient is @ ra   Throat:   Lips, mucosa, and tongue normal; teeth and gums normal. Cyanosis on lips - no  Neck:   Supple, symmetrical, trachea midline, no adenopathy;    thyroid:  no enlargement/tenderness/nodules; no carotid   bruit or JVD  Back:     Symmetric, no curvature, ROM normal, no CVA tenderness  Lungs:     Distress - no , Wheeze no, Barrell Chest - no, Purse lip breathing - no, Crackles - no   Chest Wall:    No tenderness or deformity.    Heart:    Regular rate and rhythm, S1 and S2 normal, no rub   or gallop, Murmur - YES - MVP +  Breast Exam:    NOT DONE  Abdomen:     Soft, non-tender, bowel sounds active all four quadrants,    no masses, no organomegaly. Visceral obesity - yes  Genitalia:   NOT DONE  Rectal:   NOT DONE  Extremities:   Extremities - normal, Has Cane - no, Clubbing - no, Edema - no  Pulses:   2+ and symmetric all extremities  Skin:   Stigmata of Connective Tissue Disease - no  Lymph nodes:   Cervical, supraclavicular, and axillary nodes normal  Psychiatric:  Neurologic:   Pleasant - yes, Anxious - no, Flat  affect - no  CAm-ICU - neg, Alert and Oriented x 3 - yes, Moves all 4s - yes, Speech - normal, Cognition - intact           Assessment:       ICD-10-CM   1. Tracheobronchomalacia J39.8   2. Shortness of breath R06.02        Plan:     Patient Instructions     ICD-10-CM   1. Tracheobronchomalacia J39.8   2. Shortness of breath R06.02     Stable but symptomatic  Plan Start weight loss program Continue stiolto daily  followup 3 months; if not improved can get 2nd opinion at [facility]    Dr. Brand Males, M.D., F.C.C.P,  Pulmonary and Critical Care Medicine Staff Physician, [place] Director - Interstitial Lung Disease  Program  Pulmonary [place] at [place], [place], [address]  Pager: [phone], If no answer or between  15:00h - 7:00h: call [phone] Telephone: [phone]  9:26 AM 09/12/2018

## 2018-10-01 ENCOUNTER — Other Ambulatory Visit: Payer: Self-pay | Admitting: Family Medicine

## 2018-10-02 DIAGNOSIS — L812 Freckles: Secondary | ICD-10-CM | POA: Diagnosis not present

## 2018-10-02 DIAGNOSIS — L720 Epidermal cyst: Secondary | ICD-10-CM | POA: Diagnosis not present

## 2018-10-02 DIAGNOSIS — L570 Actinic keratosis: Secondary | ICD-10-CM | POA: Diagnosis not present

## 2018-10-02 DIAGNOSIS — L821 Other seborrheic keratosis: Secondary | ICD-10-CM | POA: Diagnosis not present

## 2018-10-02 DIAGNOSIS — D1801 Hemangioma of skin and subcutaneous tissue: Secondary | ICD-10-CM | POA: Diagnosis not present

## 2018-10-02 DIAGNOSIS — Z85828 Personal history of other malignant neoplasm of skin: Secondary | ICD-10-CM | POA: Diagnosis not present

## 2018-10-17 DIAGNOSIS — Z961 Presence of intraocular lens: Secondary | ICD-10-CM | POA: Diagnosis not present

## 2018-10-17 DIAGNOSIS — H5203 Hypermetropia, bilateral: Secondary | ICD-10-CM | POA: Diagnosis not present

## 2018-10-17 DIAGNOSIS — H52223 Regular astigmatism, bilateral: Secondary | ICD-10-CM | POA: Diagnosis not present

## 2018-10-17 DIAGNOSIS — H04123 Dry eye syndrome of bilateral lacrimal glands: Secondary | ICD-10-CM | POA: Diagnosis not present

## 2018-10-17 DIAGNOSIS — H0289 Other specified disorders of eyelid: Secondary | ICD-10-CM | POA: Diagnosis not present

## 2018-10-21 IMAGING — XA Imaging study
1 series · 1 of 1 positions shown · non-contrast
Comparison: none

CLINICAL DATA: Lumbosacral spondylosis without myelopathy. LEFT hip
symptoms are improved. I repeated it.

[Series 1: ortho standard · 1 of 1 slices shown]
[im 1/1]
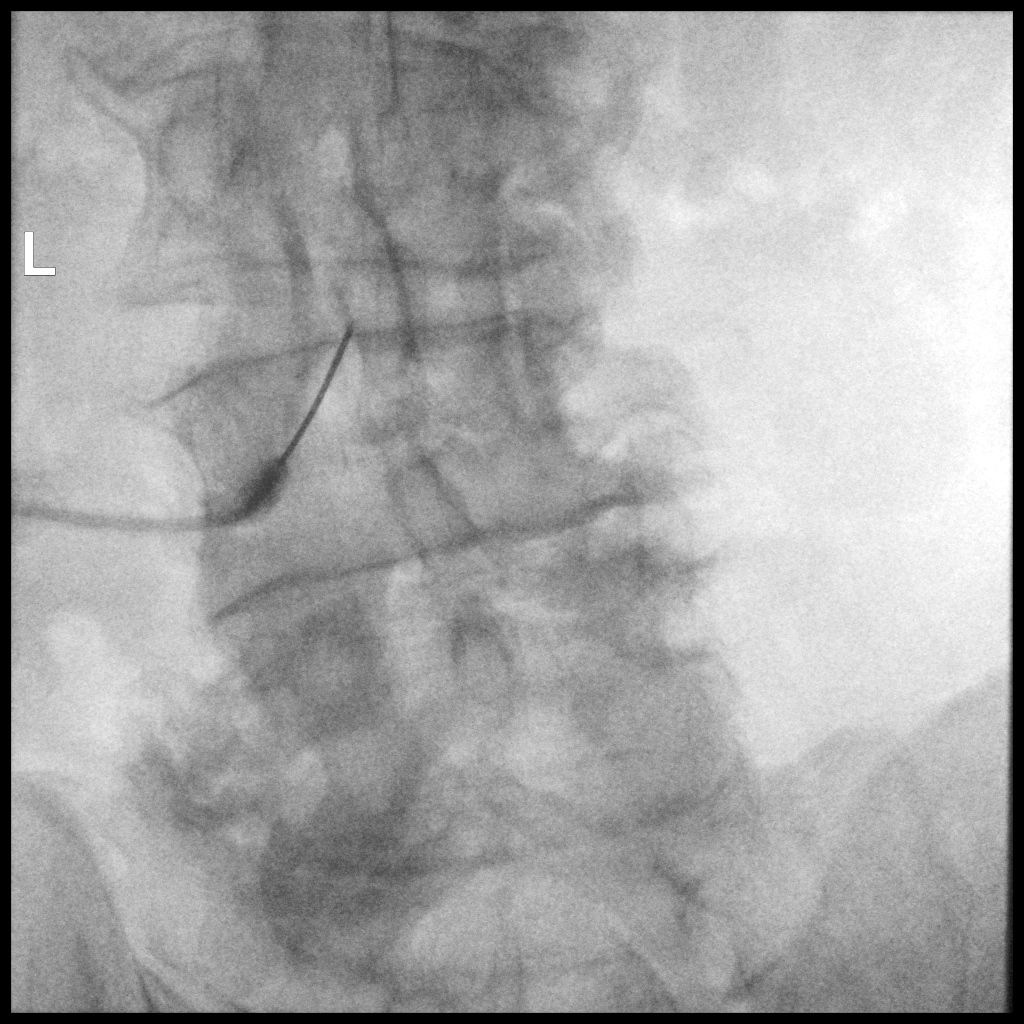

[1 of 1 positions shown; findings below may reference images not displayed]

FLUOROSCOPY TIME:  12 seconds corresponding to a Dose Area Product
of 34.56 ?Gy*m2

PROCEDURE:
The procedure, risks, benefits, and alternatives were explained to
the patient. Questions regarding the procedure were encouraged and
answered. The patient understands and consents to the procedure.

LUMBAR EPIDURAL INJECTION:

An interlaminar approach was performed on LEFT at L3-L4. The
overlying skin was cleansed and anesthetized. A 20 gauge epidural
needle was advanced using loss-of-resistance technique.

DIAGNOSTIC EPIDURAL INJECTION:

Injection of Isovue-M 200 shows a good epidural pattern with spread
above and below the level of needle placement, primarily on the
LEFT; no vascular opacification is seen.

THERAPEUTIC EPIDURAL INJECTION:

120.0 Mg of Depo-Medrol mixed with 2 mL 1% lidocaine were instilled.
The procedure was well-tolerated, and the patient was discharged
thirty minutes following the injection in good condition.

COMPLICATIONS:
None.
IMPRESSION: Technically successful epidural injection on the LEFT L3-4 # 2.

## 2018-10-22 ENCOUNTER — Ambulatory Visit (INDEPENDENT_AMBULATORY_CARE_PROVIDER_SITE_OTHER): Payer: Medicare Other | Admitting: Cardiology

## 2018-10-22 ENCOUNTER — Encounter: Payer: Self-pay | Admitting: Cardiology

## 2018-10-22 VITALS — BP 118/60 | HR 68 | Ht 70.0 in | Wt 205.4 lb

## 2018-10-22 DIAGNOSIS — I350 Nonrheumatic aortic (valve) stenosis: Secondary | ICD-10-CM | POA: Diagnosis not present

## 2018-10-22 DIAGNOSIS — I2510 Atherosclerotic heart disease of native coronary artery without angina pectoris: Secondary | ICD-10-CM | POA: Diagnosis not present

## 2018-10-22 DIAGNOSIS — I491 Atrial premature depolarization: Secondary | ICD-10-CM | POA: Diagnosis not present

## 2018-10-22 MED ORDER — ASPIRIN EC 81 MG PO TBEC: 81.0000 mg | DELAYED_RELEASE_TABLET | Freq: Every day | ORAL | 3 refills | Status: DC

## 2018-10-22 NOTE — Patient Instructions (Addendum)
Medication Instructions:  none If you need a refill on your cardiac medications before your next appointment, please call your pharmacy.   Lab work: none If you have labs (blood work) drawn today and your tests are completely normal, you will receive your results only by: . MyChart Message (if you have MyChart) OR . A paper copy in the mail If you have any lab test that is abnormal or we need to change your treatment, we will call you to review the results.  Testing/Procedures: none  Follow-Up: At [facility], you and your health needs are our priority.  As part of our continuing mission to provide you with exceptional heart care, we have created designated Provider Care Teams.  These Care Teams include your primary Cardiologist (physician) and Advanced Practice Providers (APPs -  Physician Assistants and Nurse Practitioners) who all work together to provide you with the care you need, when you need it. You will need a follow up appointment in 6 months.  Please call our office 2 months in advance to schedule this appointment.  You may see Christopher McAlhany, MD or one of the following Advanced Practice Providers on your designated Care Team:   Brittainy Simmons, PA-C Dayna Dunn, PA-C . Michele Lenze, PA-C  Any Other Special Instructions Will Be Listed Below (If Applicable).    

## 2018-10-22 NOTE — Progress Notes (Signed)
10/22/2018 Patrick Franco   [birth year]  [id number]  Primary Physician Lavone Orn, MD Primary Cardiologist: Dr. Angelena Form   Reason for Visit/CC: F/u for Aortic Stenosis and CAD  HPI:  Patrick Franco is a 84 y.o. male who is being seen today for 6 month f/u. He is followed by Dr. Angelena Form. He has a h/o CAD, aortic stenosis, HTN, mitral valve prolapse and sleep apnea. He was first referred to Dr. Angelena Form by his PCP in May 2018. He was having a colonoscopy on 04/04/17 and was found to have an irregular heart rhythm. He was sent to his primary care office where an EKG showed sinus rhythm with PACs. Echo November 2017 with normal LV systolic function, LZJQ=[id number]%, grade 1 diastolic dysfunction, functionally bicuspid aortic valve with moderate stenosis (mean gradient 21 mmHg, AVA 1.4cm2). There was mild mitral regurgitation. At his first visit here in May 2018, he described dyspnea and less energy but no chest pain or dizziness. Nuclear stress test May 2018 with no ischemia. Echo May 2019 showed normal LV systolic function, mild aortic valve stenosis (mean gradient 16 mmHg), moderate AI and mild mitral valve regurgitation with MVP. He has been started on ASA and a statin due to finding of CAD on the chest CT. Of note, he has mild exertional dyspnea at baseline. He was last seen by Dr. Angelena Form in June and symptoms were stable. He denied any change in dyspnea. He also denied CP, syncope/ near syncope and palpitations. He was instructed to f/u in 6 months with plans to repeat echo in 1 year.  He is back today for his 6 month f/u. He reports that he is doing well. EKG shows sinus bradycardia w/ 1st degree AVB and PACs. HR 56 bpm. BP is 118/60. He denies any cardiac symptoms. No change in dyspnea from usual baseline. No CP, syncope, near syncope, palpitations, LEE, orthopnea or PND. He is very active. He exercises daily. He plays tennis several days a week for ~1.5 hrs and also does yoga. He denies any change in  breathing. No chest discomfort. He reports full med compliance. He is not fasting today. He has his yearly f/u with his PCP in February and will plan to have lipids checked at that time.   Cardiac Studies   2D Echo 03/26/18 Study Conclusions  - Left ventricle: The cavity size was normal. There was severe   focal basal and moderate concentric hypertrophy. Systolic   function was normal. The estimated ejection fraction was in the   range of 60% to 65%. Wall motion was normal; there were no   regional wall motion abnormalities. There was an increased   relative contribution of atrial contraction to ventricular   filling. Doppler parameters are consistent with abnormal left   ventricular relaxation (grade 1 diastolic dysfunction). Doppler   parameters are consistent with high ventricular filling pressure. - Aortic valve: Moderately calcified annulus. Trileaflet;   moderately thickened, moderately calcified leaflets. Valve   mobility was restricted. There was mild to moderate stenosis.   There was moderate regurgitation. Regurgitation pressure   half-time: 354 ms. - Aorta: Aortic root dimension: 39 mm (ED). - Aortic root: The aortic root was mildly dilated. - Mitral valve: Mild, late systolicprolapse, involving the anterior   leaflet and the posterior leaflet. There was mild regurgitation. - Left atrium: The atrium was moderately dilated. - Right ventricle: The cavity size was mildly dilated. Wall   thickness was normal. - Atrial septum: There was increased thickness of  the septum,   consistent with lipomatous hypertrophy.  NST 04/07/18 Study Highlights     Nuclear stress EF: 61%.  The left ventricular ejection fraction is normal (55-65%).  There was no ST segment deviation noted during stress.  The study is normal.  This is a low risk study.   Normal stress nuclear study with no ischemia or infarction; EF 61 with normal wall motion.        Current Meds  Medication  Sig  . allopurinol (ZYLOPRIM) 100 MG tablet Take 1 tablet (100 mg total) by mouth daily.  [name] atorvastatin (LIPITOR) 10 MG tablet   . colchicine 0.6 MG tablet Take 1 tablet (0.6 mg total) by mouth 2 (two) times daily as needed.  . dutasteride (AVODART) 0.5 MG capsule Take 0.5 mg by mouth daily.  [name] esomeprazole (NEXIUM) 40 MG capsule Take 1 capsule (40 mg total) by mouth 2 (two) times daily before a meal.  . gabapentin (NEURONTIN) 400 MG capsule TAKE 1 CAPSULE AT BEDTIME.  . indapamide (LOZOL) 2.5 MG tablet Take 2.5 mg by mouth daily.  . mirtazapine (REMERON) 30 MG tablet TAKE ONE TABLET AT BEDTIME.  . Multiple Vitamin (MULTIVITAMIN) tablet Take 1 tablet by mouth daily.  . Omega-3 Fatty Acids (FISH OIL PO) Take 1,000 mg by mouth daily.  . Probiotic Product (PROBIOTIC DAILY PO) Take 1 tablet by mouth daily.  [name] Respiratory Therapy Supplies (FLUTTER) DEVI Use the flutter valve 10 times when doing it daily.  . tamsulosin (FLOMAX) 0.4 MG CAPS capsule Take 0.4 mg by mouth daily.  . Tiotropium Bromide-Olodaterol (STIOLTO RESPIMAT) 2.5-2.5 MCG/ACT AERS Inhale 2 puffs into the lungs daily.  . Turmeric 500 MG CAPS Take 500 mg by mouth daily.  . verapamil (CALAN-SR) 240 MG CR tablet Take 240 mg by mouth daily.  . Vitamin D, Ergocalciferol, (DRISDOL) 1.25 MG (50000 UT) CAPS capsule TAKE 1 CAPSULE WEEKLY.   No Known Allergies Past Medical History:  Diagnosis Date  . Aortic stenosis   . BPH (benign prostatic hyperplasia)   . Chronic low back pain   . Depression   . Diverticulitis   . Elevated PSA   . Foot drop, right   . Foot drop, right   . Gait disorder 02/03/2015  . Hip pain   . History of colonoscopy   . HOH (hard of hearing)    hearing aid  . Hypertension   . Lumbar radiculopathy   . Mitral valve prolapse   . Onychomycosis   . Peptic ulcer disease   . Sepsis ([place]) 2005   e. coli-after prostate bx  . Sleep apnea   . Torn ACL    Family History  Problem Relation Age of Onset  .  Pneumonia Mother 51  . Cancer - Lung Father 39  . Multiple myeloma Brother 61   Past Surgical History:  Procedure Laterality Date  . CATARACT EXTRACTION Bilateral   . ESOPHAGOGASTRODUODENOSCOPY N/A 08/19/2015   Procedure: ESOPHAGOGASTRODUODENOSCOPY (EGD);  Surgeon: Laurence Spates, MD;  Location: [facility] ENDOSCOPY;  Service: Endoscopy;  Laterality: N/A;  . right knee replacement Right 1996  . SHOULDER SURGERY    . TONSILLECTOMY     Social History   Socioeconomic History  . Marital status: Married    Spouse name: Not on file  . Number of children: 2  . Years of education: Not on file  . Highest education level: Not on file  Occupational History  . Occupation: Retired Social research officer, government  Social Needs  . Emergency planning/management officer  strain: Not on file  . Food insecurity:    Worry: Not on file    Inability: Not on file  . Transportation needs:    Medical: Not on file    Non-medical: Not on file  Tobacco Use  . Smoking status: Former Smoker    Packs/day: 0.50    Types: Cigarettes    Start date: 1960    Last attempt to quit: 1961    Years since quitting: 58.9  . Smokeless tobacco: Never Used  . 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 Alcohol use: Not on file  . Drug use: No  . Sexual activity: Yes  Lifestyle  . Physical activity:    Days per week: Not on file    Minutes per session: Not on file  . Stress: Not on file  Relationships  . Social connections:    Talks on phone: Not on file    Gets together: Not on file    Attends religious service: Not on file    Active member of club or organization: Not on file    Attends meetings of clubs or organizations: Not on file    Relationship status: Not on file  . Intimate partner violence:    Fear of current or ex partner: Not on file    Emotionally abused: Not on file    Physically abused: Not on file    Forced sexual activity: Not on file  Other Topics Concern  . Not on file  Social  History Narrative   Patient is right handed.   Patient drinks 1 cup of coffee per day.     Review of Systems: General: negative for chills, fever, night sweats or weight changes.  Cardiovascular: negative for chest pain, dyspnea on exertion, edema, orthopnea, palpitations, paroxysmal nocturnal dyspnea or shortness of breath Dermatological: negative for rash Respiratory: negative for cough or wheezing Urologic: negative for hematuria Abdominal: negative for nausea, vomiting, diarrhea, bright red blood per rectum, melena, or hematemesis Neurologic: negative for visual changes, syncope, or dizziness All other systems reviewed and are otherwise negative except as noted above.   Physical Exam:  Blood pressure 118/60, pulse 68, height '5\' 10"'$  (1.778 m), weight 205 lb 6.4 oz (93.2 kg), SpO2 95 %.  General appearance: alert, cooperative and no distress Neck: no carotid bruit and no JVD Lungs: clear to auscultation bilaterally Heart: regular rate and rhythm and 2/6 murmur at RUSB' Extremities: extremities normal, atraumatic, no cyanosis or edema Pulses: 2+ and symmetric Skin: Skin color, texture, turgor normal. No rashes or lesions Neurologic: Grossly normal  EKG Sinus brady, 1st degree AVR 56 bpm, PACs -- personally reviewed   ASSESSMENT AND PLAN:   1. Aortic Stenosis: Mild to moderate by echo May 2019 (mean gradient 16 mmHg). There is moderate AI. He denies any significant dyspnea. No change in baseline. No CP. No exertional symptoms. Plan is to repeat echo in June 2020.  2. CAD w/ Angina: He has had no cardiac cath. Chest CT in February 2019 with finding of coronary atherosclerosis. Nuclear stress test in 2018 with no ischemia. He denies exertional CP and dyspnea. Continue medical therapy w/ ASA and statin. Recommend LDL goal < 70 m/dL. His CP will check Lipids in Feb 2020. He is on verapamil. HR and BP is well controlled.   3. Mitral Regurgitation: mild by echo in May 2019. No symptoms.     4. PACs: noted on EKG today. Denies palpitations.  Continue on verapamil.   5. Sinus Bradycardia: HR 56. On verapamil for PACs. Tolerating ok. Asymptomatic.   6. Diastolic Dysfunction: O1VW noted on past echo. Volume appears stable. BP and HR both well controlled.   Pt instructed to continue on current medical regimen and encouraged to continue to live an active lifestyle and to notify us if any change in baseline, including any increased dyspnea or development of chest pain.    Follow-Up w/ Dr. Angelena Form in 6 months.    Ladoris Gene, MHS [facility] 10/22/2018 10:31 AM

## 2018-11-12 NOTE — Progress Notes (Signed)
[facility] [address], [place] [address] Phone: [phone] Subjective:   Patrick Franco, am serving as a scribe for Dr. Hulan Saas.   CC: Back pain follow-up  [id number]  Patrick Franco is a 84 y.o. male coming in with complaint of back pain. He states that the last epidural did not have any effect on his back. Has been having achy pain in the lower back. Also feels like the back is weak. Is not able to walk more than 200 feet without feeling like he needs to sit down. Patient feels like his balance has gotten worse as he is dizzy from sit to stand. Is using 400 mg of gabapentin every other day. Wants to know if this is causing his dizziness. He feels like the medication is not helping him.       Past Medical History:  Diagnosis Date  . Aortic stenosis   . BPH (benign prostatic hyperplasia)   . Chronic low back pain   . Depression   . Diverticulitis   . Elevated PSA   . Foot drop, right   . Foot drop, right   . Gait disorder 02/03/2015  . Hip pain   . History of colonoscopy   . HOH (hard of hearing)    hearing aid  . Hypertension   . Lumbar radiculopathy   . Mitral valve prolapse   . Onychomycosis   . Peptic ulcer disease   . Sepsis ([place]) 2005   e. coli-after prostate bx  . Sleep apnea   . Torn ACL    Past Surgical History:  Procedure Laterality Date  . CATARACT EXTRACTION Bilateral   . ESOPHAGOGASTRODUODENOSCOPY N/A 08/19/2015   Procedure: ESOPHAGOGASTRODUODENOSCOPY (EGD);  Surgeon: Laurence Spates, MD;  Location: [facility] ENDOSCOPY;  Service: Endoscopy;  Laterality: N/A;  . right knee replacement Right 1996  . SHOULDER SURGERY    . TONSILLECTOMY     Social History   Socioeconomic History  . Marital status: Married    Spouse name: Not on file  . Number of children: 2  . Years of education: Not on file  . Highest education level: Not on file  Occupational History  . Occupation: Retired Social research officer, government  Social Needs  .  Financial resource strain: Not on file  . Food insecurity:    Worry: Not on file    Inability: Not on file  . Transportation needs:    Medical: Not on file    Non-medical: Not on file  Tobacco Use  . Smoking status: Former Smoker    Packs/day: 0.50    Types: Cigarettes    Start date: 1960    Last attempt to quit: 1961    Years since quitting: 59.0  . Smokeless tobacco: Never Used  . 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 Alcohol use: Not on file  . Drug use: Franco  . Sexual activity: Yes  Lifestyle  . Physical activity:    Days per week: Not on file    Minutes per session: Not on file  . Stress: Not on file  Relationships  . Social connections:    Talks on phone: Not on file    Gets together: Not on file    Attends religious service: Not on file    Active member of club or organization: Not on file    Attends meetings of clubs or organizations: Not on file  Relationship status: Not on file  Other Topics Concern  . Not on file  Social History Narrative   Patient is right handed.   Patient drinks 1 cup of coffee per day.   Franco Known Allergies Family History  Problem Relation Age of Onset  . Pneumonia Mother 68  . Cancer - Lung Father 53  . Multiple myeloma Brother 42     Current Outpatient Medications (Cardiovascular):  .  atorvastatin (LIPITOR) 10 MG tablet,  .  indapamide (LOZOL) 2.5 MG tablet, Take 2.5 mg by mouth daily. .  verapamil (CALAN-SR) 240 MG CR tablet, Take 240 mg by mouth daily.  Current Outpatient Medications (Respiratory):  [name]  Tiotropium Bromide-Olodaterol (STIOLTO RESPIMAT) 2.5-2.5 MCG/ACT AERS, Inhale 2 puffs into the lungs daily.  Current Outpatient Medications (Analgesics):  .  allopurinol (ZYLOPRIM) 100 MG tablet, Take 1 tablet (100 mg total) by mouth daily. [name]  aspirin EC 81 MG tablet, Take 1 tablet (81 mg total) by mouth daily. .  colchicine 0.6 MG tablet, Take 1 tablet (0.6 mg total) by mouth  2 (two) times daily as needed.   Current Outpatient Medications (Other):  .  dutasteride (AVODART) 0.5 MG capsule, Take 0.5 mg by mouth daily. [name]  esomeprazole (NEXIUM) 40 MG capsule, Take 1 capsule (40 mg total) by mouth 2 (two) times daily before a meal. .  gabapentin (NEURONTIN) 100 MG capsule, Take 2 capsules (200 mg total) by mouth at bedtime. .  mirtazapine (REMERON) 30 MG tablet, TAKE ONE TABLET AT BEDTIME. .  Multiple Vitamin (MULTIVITAMIN) tablet, Take 1 tablet by mouth daily. .  Omega-3 Fatty Acids (FISH OIL PO), Take 1,000 mg by mouth daily. .  Probiotic Product (PROBIOTIC DAILY PO), Take 1 tablet by mouth daily. [name]  Respiratory Therapy Supplies (FLUTTER) DEVI, Use the flutter valve 10 times when doing it daily. .  tamsulosin (FLOMAX) 0.4 MG CAPS capsule, Take 0.4 mg by mouth daily. .  Turmeric 500 MG CAPS, Take 500 mg by mouth daily. .  Vitamin D, Ergocalciferol, (DRISDOL) 1.25 MG (50000 UT) CAPS capsule, TAKE 1 CAPSULE WEEKLY.    Past medical history, social, surgical and family history all reviewed in electronic medical record.  Franco pertanent information unless stated regarding to the chief complaint.   Review of Systems:  Franco headache, visual changes, nausea, vomiting, diarrhea, constipation, dizziness, abdominal pain, skin rash, fevers, chills, night sweats, weight loss, swollen lymph nodes, body aches, joint swelling,  chest pain, shortness of breath, mood changes.  Positive muscle aches  Objective  Blood pressure 124/80, pulse 82, height 5' 10"  (1.778 m), weight 209 lb (94.8 kg), SpO2 98 %.   General: Franco apparent distress alert and oriented x3 mood and affect normal, dressed appropriately.  HEENT: Pupils equal, extraocular movements intact  Respiratory: Patient's speak in full sentences and does not appear short of breath  Cardiovascular: 1+ lower extremity edema, non tender, Franco erythema  Skin: Warm dry intact with Franco signs of infection or rash on extremities or on axial  skeleton.  Abdomen: Soft nontender  Neuro: Cranial nerves II through XII are intact, neurovascularly intact in all extremities with 2+ DTRs and 2+ pulses.  Lymph: Franco lymphadenopathy of posterior or anterior cervical chain or axillae bilaterally.  Gait mild antalgic mild wide-based gait MSK:  tender with limited range of motion and stability and symmetric strength and tone of shoulders, elbows, wrist, hip, knee and ankles bilaterally.  Moderate arthritic changes of multiple joints   Back exam shows some degenerative  scoliosis with loss of lordosis.  Patient unable to do [facility] test secondary to tightness bilaterally.  Significant tightness with straight leg test.  Mild atrophy of the lower extremities compared to previous exam.  4 out of 5 strength but symmetric of the lower extremities.  Deep tendon reflexes though do appear intact and brisk.  Negative Babinski Impression and Recommendations:     This case required medical decision making of moderate complexity. The above documentation has been reviewed and is accurate and complete Lyndal Pulley, DO       Note: This dictation was prepared with Dragon dictation along with smaller phrase technology. Any transcriptional errors that result from this process are unintentional.

## 2018-11-13 ENCOUNTER — Encounter: Payer: Self-pay | Admitting: Family Medicine

## 2018-11-13 ENCOUNTER — Ambulatory Visit (INDEPENDENT_AMBULATORY_CARE_PROVIDER_SITE_OTHER): Payer: Medicare Other | Admitting: Family Medicine

## 2018-11-13 VITALS — BP 124/80 | HR 82 | Ht 70.0 in | Wt 209.0 lb

## 2018-11-13 DIAGNOSIS — I2510 Atherosclerotic heart disease of native coronary artery without angina pectoris: Secondary | ICD-10-CM | POA: Diagnosis not present

## 2018-11-13 DIAGNOSIS — M48062 Spinal stenosis, lumbar region with neurogenic claudication: Secondary | ICD-10-CM | POA: Diagnosis not present

## 2018-11-13 DIAGNOSIS — R278 Other lack of coordination: Secondary | ICD-10-CM

## 2018-11-13 DIAGNOSIS — R2689 Other abnormalities of gait and mobility: Secondary | ICD-10-CM | POA: Diagnosis not present

## 2018-11-13 MED ORDER — GABAPENTIN 100 MG PO CAPS: 200.0000 mg | ORAL_CAPSULE | Freq: Every day | ORAL | 3 refills | Status: DC

## 2018-11-13 NOTE — Assessment & Plan Note (Signed)
Moderate spinal stenosis complained of some of patient's weakness in the legs is secondary to more the spinal stenosis.  I believe some of the balancing difficulties could be this as well.  Differential includes a potential side effect of gabapentin decreased from 400 mg every other day to 100 or 200 mg at night daily.  We discussed icing regimen and home exercises.  Discussed which activities to do.  Send patient to formal physical therapy.  Patient declined any type of repeat epidurals at the moment.  Follow-up with me again in 6 weeks.  Spent  25 minutes with patient face-to-face and had greater than 50% of counseling including as described above in assessment and plan.

## 2018-11-13 NOTE — Patient Instructions (Addendum)
Good to see you  Ice is your friend Stay active Lets decrease the gabapentin and see how you do  Gabapentin 100-200mg  at night  PT will call you  See me again in 6ish weeks to see how you are doing

## 2018-12-04 DIAGNOSIS — M48062 Spinal stenosis, lumbar region with neurogenic claudication: Secondary | ICD-10-CM | POA: Diagnosis not present

## 2018-12-04 DIAGNOSIS — M545 Low back pain: Secondary | ICD-10-CM | POA: Diagnosis not present

## 2018-12-04 DIAGNOSIS — M1712 Unilateral primary osteoarthritis, left knee: Secondary | ICD-10-CM | POA: Diagnosis not present

## 2018-12-11 DIAGNOSIS — M545 Low back pain: Secondary | ICD-10-CM | POA: Diagnosis not present

## 2018-12-11 DIAGNOSIS — M1712 Unilateral primary osteoarthritis, left knee: Secondary | ICD-10-CM | POA: Diagnosis not present

## 2018-12-11 DIAGNOSIS — M48062 Spinal stenosis, lumbar region with neurogenic claudication: Secondary | ICD-10-CM | POA: Diagnosis not present

## 2018-12-14 ENCOUNTER — Ambulatory Visit (INDEPENDENT_AMBULATORY_CARE_PROVIDER_SITE_OTHER): Payer: Medicare Other | Admitting: Internal Medicine

## 2018-12-14 ENCOUNTER — Encounter: Payer: Self-pay | Admitting: Internal Medicine

## 2018-12-14 VITALS — BP 158/76 | HR 85 | Ht 70.0 in | Wt 205.0 lb

## 2018-12-14 DIAGNOSIS — J398 Other specified diseases of upper respiratory tract: Secondary | ICD-10-CM

## 2018-12-14 DIAGNOSIS — R0602 Shortness of breath: Secondary | ICD-10-CM | POA: Diagnosis not present

## 2018-12-14 NOTE — Progress Notes (Signed)
HPI  PCP Lavone Orn, MD   IOV 12/08/2017  Chief Complaint  Patient presents with  . Advice Only    Referred by Dr. Laurann Montana due to SOB and an abn pft. Pt states SOB has been happening x3 years ago which has become worse.    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  He was president of a Human resources officer.  He has chronic thoracic dextroscoliosis.  He is here for insidious onset of shortness of breath.  He tells me that he once a month at home and [place].  The home is at [place],000 [place].  Ever since he turned 85 years old when he is at this home where he spends the summer for 2 months he is always hypoxemic and short of breath climbing a flight of stairs.  He is having to use oxygen quite a bit there.  He does not have a problem at night because he uses a CPAP.  He says he never gets acclimate highest.  He is able to handle 7000 feet pretty well when he was fly fishing but when he gets to his home at 10,000 feet he has a problem.  He thinks the severity of this problem is getting worse slowly.  In addition he is also plays doubles tennis and he is getting more more short of breath in the last 3 years.  However for day-to-day exertion he does not have a problem.  He goes for daily walks but this is compounded by the fact he has back pain from his chronic dextroscoliosis in the thoracic region.  When he gets an epidural shot he is able to walk more   Previous imaging personally visualized.  Chest x-ray August 08, 2017 shows profound thoracic scoliosis.  This is also seen in a CT abdomen lung cut from 2017 that is associated with left-sided basal atelectasis.  In that lung cut I do not see any evidence of ILD.   He did not have ischemia on a cardiac stress test the same month. Echocardiogram May 2018: Ejection fraction 65% with grade 1 diastolic dysfunction mild aortic stenosis.   Last hemoglobin check was in October 2016 and he was anemic at the time 9.8 g%.  -According to the patient this happened when he had GI bleed.  He says since then this is resolved.  He believes his hemoglobin is being rechecked by his primary care physician and this should be normal because he is not aware that he is anemic anymore.  He says he will have his this rechecked by his primary care physician in the coming weeks.  Walking desat in office: did not desat 185 feet x 3 laps  OV 01/25/2018  Chief Complaint  Patient presents with  . Follow-up    HRCT done 12/27/17  Pt states he is still becoming SOB but then after he sits down to rest, he will be able to do other activities again. Denies  any complaints of cough or CP.    Here with wife to discuss below. Wife reports that albuterol during spirometry helped dyspnea. No new issues. REports being on CPAP for OSA. Does exercises such as yoga and pilates. Plays doubles tennis without moving due to dyspnea.    1. Tracheobronchomalacia. CT chest 12/27/17 2. Moderate patchy air trapping in both lungs, indicative of small airways disease. 3. Scattered mild cylindrical and varicoid bronchiectasis in both lungs. Areas of postinfectious/postinflammatory scarring in the mid to lower lungs bilaterally. Otherwise no  evidence of interstitial lung disease.-> #1, #2 and #3 can explain dyspnea 4. Borderline mild cardiomegaly. Dilated main pulmonary artery, suggesting pulmonary arterial hypertension. 5. Left main and 3 vessel coronary atherosclerosis.- not to worry already had normal stress test 6. Inferior left thyroid lobe 4.6 cm nodule, for which thyroid ultrasound correlation is warranted if not previously performed.- needs to talk to pcp 7. Diffuse hepatic steatosis.- talk to pcp 8. Stable right adrenal adenoma.  Aortic Atherosclerosis (ICD10-I70.0).    OV 03/15/2018  Chief Complaint  Patient presents with  . Follow-up    thyroid was biopsied yesterday, 5/1.  Pt is having a sonogram on heart in a couple weeks.  Still  having some mild SOB. Denies any cough or CP. Pt started on Lipitor about 2 weeks ago by PCP and will occ become dizzy.     Follow-up dyspnea due to visceral obesity, tracheobronchomalacia, pulmonary air trapping and bronchomalacia without complication  Last visit we started incentive spirometry, encourage CPAP compliance and also flutter valve use during the day.  We also started inhaler Stiolto.  The combination of these measures have helped him somewhat.  He particularly gets significant benefit from the devices but not as much from the inhaler.  Nevertheless he wants to continue the inhaler.  We gave him a sample and he ran out of the sample approximately 1 week ago.  Despite not  taking inhaler he does not notice much worsening.  Nevertheless he wants to continue with inhaler.  He is worried about his coronary artery calcification.  I gave him the results at last visit but he does not remember this.  Nevertheless his primary care physician has ordered an echo and refer him to cardiology.  He did have a normal stress test in May 2018.  I shared all these results with him.    OV 09/12/2018  Subjective:  Patient ID: [name], male , DOB: 29-Dec-1931 , age 86 y.o. , MRN: [id number] , ADDRESS: [address] [place] [address]   09/12/2018 -   Chief Complaint  Patient presents with  . Follow-up    worsened SOB. unable to perform activities     HPI KORVIN VALENTINE 85 y.o. - bronchomalacia with multofactorial dyspnea -  visceral obesity, tracheobronchomalacia, pulmonary air trapping and bronchomalacia without complication   Returns for follow-up.  Since his last visit he spends 2 months in [place], [place].  He says he is extremely dyspneic.  It was an elevation of 10,000 feet.  He makes annual trips because he owns a home there.  He said he cannot do anything and is very dyspneic.  His pulse ox was in the low 80s.  He has not sold his home there and return.  He says he is gained a  lot of weight in the last year.  His current weight is 212 pounds.  He understands that he needs to lose weight.  While in [place] he did not use his Stiolto.  Since then he is restarted.  Nevertheless even if he plays 15 minutes of tennis he is extremely short of breath.  There is no wheezing or cough.  We [facility] referral for the tracheal bronchomalacia but he wants to hold off.  He first wants to try losing weight.[name]  He is also bothered by lumbar stenosis.  He continues with yoga and Pilates.  He is up-to-date with his flu shot.     ROS - per HPI   OV 12/14/2018  Subjective:  Patient ID: RUFFUS KAMAKA, male , DOB: September 30, 1932 , age 32 y.o. , MRN: [id number] , ADDRESS: [address] [place] [address]   12/14/2018 -   Chief Complaint  Patient presents with  . Follow-up    Pt states he feels healthier than before and has been working out more.    Bronchomalacia with multofactorial dyspnea -  visceral obesity, tracheobronchomalacia, pulmonary air trapping and bronchomalacia without complication  HPI LANE ELAND 85 y.o. -returns for follow-up of the above issues.  In the interim he has sold his house in [place].  This is because of shortness of breath.  He is also stopped playing tennis because of balance issues.  He has lost weight from 212 pounds to 205 pounds.  He is doing yoga and Pilates and working with a trainer for balance issues.  With all this his shortness of breath is better but then he also states that he has not been exerting himself as much such as going into the mountains and playing tennis.  Therefore he has limited himself.  Nevertheless he is feeling better because of the weight loss.  There has been no respiratory exacerbations.  He is taking his Stiolto 3 times a week.     ROS - per HPI     has a past medical history of Aortic stenosis, BPH (benign prostatic hyperplasia), Chronic low back pain, Depression, Diverticulitis, Elevated PSA, Foot  drop, right, Foot drop, right, Gait disorder (02/03/2015), Hip pain, History of colonoscopy, HOH (hard of hearing), Hypertension, Lumbar radiculopathy, Mitral valve prolapse, Onychomycosis, Peptic ulcer disease, Sepsis ([place]) (2005), Sleep apnea, and Torn ACL.   reports that he quit smoking about 59 years ago. His smoking use included cigarettes. He started smoking about 60 years ago. He smoked 0.50 packs per day. He has never used smokeless tobacco.  Past Surgical History:  Procedure Laterality Date  . CATARACT EXTRACTION Bilateral   . ESOPHAGOGASTRODUODENOSCOPY N/A 08/19/2015   Procedure: ESOPHAGOGASTRODUODENOSCOPY (EGD);  Surgeon: Laurence Spates, MD;  Location: [facility] ENDOSCOPY;  Service: Endoscopy;  Laterality: N/A;  . right knee replacement Right 1996  . SHOULDER SURGERY    . TONSILLECTOMY      No Known Allergies  Immunization History  Administered Date(s) Administered  . Influenza Split 08/29/2012  . Influenza, High Dose Seasonal PF 09/10/2017, 08/14/2018  . Influenza,inj,Quad PF,6+ Mos 09/12/2013, 09/11/2015  . Pneumococcal Conjugate-13 01/13/2014  . Pneumococcal Polysaccharide-23 11/14/2004  . Tdap 01/18/2016    Family History  Problem Relation Age of Onset  . Pneumonia Mother 38  . Cancer - Lung Father 15  . Multiple myeloma Brother 68     Current Outpatient Medications:  .  allopurinol (ZYLOPRIM) 100 MG tablet, Take 1 tablet (100 mg total) by mouth daily., Disp: 90 tablet, Rfl: 3 .  aspirin EC 81 MG tablet, Take 1 tablet (81 mg total) by mouth daily., Disp: 90 tablet, Rfl: 3 .  atorvastatin (LIPITOR) 10 MG tablet, , Disp: , Rfl: 10 .  colchicine 0.6 MG tablet, Take 1 tablet (0.6 mg total) by mouth 2 (two) times daily as needed., Disp: 30 tablet, Rfl: 2 .  dutasteride (AVODART) 0.5 MG capsule, Take 0.5 mg by mouth daily., Disp: , Rfl: 2 .  esomeprazole (NEXIUM) 40 MG capsule, Take 1 capsule (40 mg total) by mouth 2 (two) times daily before a meal., Disp: 60 capsule, Rfl: 0 .   gabapentin (NEURONTIN) 100 MG capsule, Take 2 capsules (200 mg total) by mouth at bedtime.,  Disp: 60 capsule, Rfl: 3 .  indapamide (LOZOL) 2.5 MG tablet, Take 2.5 mg by mouth daily., Disp: , Rfl: 1 .  mirtazapine (REMERON) 30 MG tablet, TAKE ONE TABLET AT BEDTIME., Disp: 90 tablet, Rfl: 0 .  Multiple Vitamin (MULTIVITAMIN) tablet, Take 1 tablet by mouth daily., Disp: , Rfl:  .  Omega-3 Fatty Acids (FISH OIL PO), Take 1,000 mg by mouth daily., Disp: , Rfl:  .  Probiotic Product (PROBIOTIC DAILY PO), Take 1 tablet by mouth daily., Disp: , Rfl:  .  Respiratory Therapy Supplies (FLUTTER) DEVI, Use the flutter valve 10 times when doing it daily., Disp: 1 each, Rfl: 0 .  tamsulosin (FLOMAX) 0.4 MG CAPS capsule, Take 0.4 mg by mouth daily., Disp: , Rfl: 2 .  Tiotropium Bromide-Olodaterol (STIOLTO RESPIMAT) 2.5-2.5 MCG/ACT AERS, Inhale 2 puffs into the lungs daily., Disp: 1 Inhaler, Rfl: 0 .  Turmeric 500 MG CAPS, Take 500 mg by mouth daily., Disp: , Rfl:  .  verapamil (CALAN-SR) 240 MG CR tablet, Take 240 mg by mouth daily., Disp: , Rfl: 2 .  Vitamin D, Ergocalciferol, (DRISDOL) 1.25 MG (50000 UT) CAPS capsule, TAKE 1 CAPSULE WEEKLY., Disp: 12 capsule, Rfl: 0      Objective:   Vitals:   12/14/18 0850  BP: (!) 158/76  Pulse: 85  SpO2: 96%  Weight: 205 lb (93 kg)  Height: _0  (1.778 m)    Estimated body mass index is 29.41 kg/m as calculated from the following:   Height as of this encounter: _1  (1.778 m).   Weight as of this encounter: 205 lb (93 kg).  _2 @  [employer]   12/14/18 0850  Weight: 205 lb (93 kg)     Physical Exam  General Appearance:    Alert, cooperative, no distress, appears stated age - yes , Deconditioned looking - no , OBESE  - improved, Sitting on Wheelchair -  no  Head:    Normocephalic, without obvious abnormality, atraumatic  Eyes:    PERRL, conjunctiva/corneas clear,  Ears:    Normal TM's and external ear canals, both ears  Nose:   Nares  normal, septum midline, mucosa normal, no drainage    or sinus tenderness. OXYGEN ON  - no . Patient is @ ra   Throat:   Lips, mucosa, and tongue normal; teeth and gums normal. Cyanosis on lips - no  Neck:   Supple, symmetrical, trachea midline, no adenopathy;    thyroid:  no enlargement/tenderness/nodules; no carotid   bruit or JVD  Back:     Symmetric, no curvature, ROM normal, no CVA tenderness  Lungs:     Distress - no , Wheeze non, Barrell Chest - o, Purse lip breathing - no, Crackles - no   Chest Wall:    No tenderness or deformity.    Heart:    Regular rate and rhythm, S1 and S2 normal, no rub   or gallop, Murmur - YEs  Breast Exam:    NOT DONE  Abdomen:     Soft, non-tender, bowel sounds active all four quadrants,    no masses, no organomegaly. Visceral obesity - yes but bett  Genitalia:   NOT DONE  Rectal:   NOT DONE  Extremities:   Extremities - normal, Has Cane - no, Clubbing - no, Edema - no  Pulses:   2+ and symmetric all extremities  Skin:   Stigmata of Connective Tissue Disease - no  Lymph nodes:   Cervical, supraclavicular, and axillary nodes  normal  Psychiatric:  Neurologic:   Pleasant - yes, Anxious - no, Flat affect - no  CAm-ICU - neg, Alert and Oriented x 3 - yes, Moves all 4s - yes, Speech - normal, Cognition - intact           Assessment:     No diagnosis found.     Plan:     Patient Instructions  Tracheobronchomalacia Pulmonary air trapping Bronchiectasis without complication ([place]) -  STable disease Glad weight loss going well  PLAN  - continue stiolto -ok to do it 3x/week - use incentive spirometry during day time 10 times daily  - use flutter valve during day time 10 times daily - use CPAP at night - continue yoga, pilates and exercises and weight loss   Followup  60month or sooner if needed     SIGNATURE    Dr. MBrand Males M.D., F.C.C.P,  Pulmonary and Critical Care Medicine Staff Physician, [facility] Director - Interstitial Lung Disease  Program  [place] [address] Pager: [phone] If no answer or between  15:00h - 7:00h: call [phone] Telephone: [phone]  9:23 AM 12/14/2018

## 2018-12-14 NOTE — Patient Instructions (Signed)
Tracheobronchomalacia Pulmonary air trapping Bronchiectasis without complication (HCC) -  STable disease Glad weight loss going well  PLAN  - continue stiolto -ok to do it 3x/week - use incentive spirometry during day time 10 times daily  - use flutter valve during day time 10 times daily - use CPAP at night - continue yoga, pilates and exercises and weight loss   Followup  11months or sooner if needed

## 2018-12-18 DIAGNOSIS — M1712 Unilateral primary osteoarthritis, left knee: Secondary | ICD-10-CM | POA: Diagnosis not present

## 2018-12-18 DIAGNOSIS — M545 Low back pain: Secondary | ICD-10-CM | POA: Diagnosis not present

## 2018-12-18 DIAGNOSIS — M48062 Spinal stenosis, lumbar region with neurogenic claudication: Secondary | ICD-10-CM | POA: Diagnosis not present

## 2018-12-25 ENCOUNTER — Ambulatory Visit: Payer: Medicare Other | Admitting: Family Medicine

## 2018-12-25 DIAGNOSIS — M1712 Unilateral primary osteoarthritis, left knee: Secondary | ICD-10-CM | POA: Diagnosis not present

## 2018-12-25 DIAGNOSIS — M545 Low back pain: Secondary | ICD-10-CM | POA: Diagnosis not present

## 2018-12-25 DIAGNOSIS — M48062 Spinal stenosis, lumbar region with neurogenic claudication: Secondary | ICD-10-CM | POA: Diagnosis not present

## 2019-01-01 ENCOUNTER — Other Ambulatory Visit: Payer: Self-pay | Admitting: Family Medicine

## 2019-01-01 DIAGNOSIS — M1712 Unilateral primary osteoarthritis, left knee: Secondary | ICD-10-CM | POA: Diagnosis not present

## 2019-01-01 DIAGNOSIS — M545 Low back pain: Secondary | ICD-10-CM | POA: Diagnosis not present

## 2019-01-01 DIAGNOSIS — M48062 Spinal stenosis, lumbar region with neurogenic claudication: Secondary | ICD-10-CM | POA: Diagnosis not present

## 2019-01-08 DIAGNOSIS — M545 Low back pain: Secondary | ICD-10-CM | POA: Diagnosis not present

## 2019-01-08 DIAGNOSIS — M48062 Spinal stenosis, lumbar region with neurogenic claudication: Secondary | ICD-10-CM | POA: Diagnosis not present

## 2019-01-08 DIAGNOSIS — M1712 Unilateral primary osteoarthritis, left knee: Secondary | ICD-10-CM | POA: Diagnosis not present

## 2019-01-22 DIAGNOSIS — M1712 Unilateral primary osteoarthritis, left knee: Secondary | ICD-10-CM | POA: Diagnosis not present

## 2019-01-22 DIAGNOSIS — M545 Low back pain: Secondary | ICD-10-CM | POA: Diagnosis not present

## 2019-01-22 DIAGNOSIS — M48062 Spinal stenosis, lumbar region with neurogenic claudication: Secondary | ICD-10-CM | POA: Diagnosis not present

## 2019-01-25 ENCOUNTER — Other Ambulatory Visit: Payer: Self-pay | Admitting: Family Medicine

## 2019-02-12 ENCOUNTER — Other Ambulatory Visit: Payer: Self-pay | Admitting: Family Medicine

## 2019-03-17 IMAGING — CR DG CHEST 2V
2 series · 2 of 2 positions shown · non-contrast
Comparison: 12/14/2009

CLINICAL DATA: Dyspnea on exertion for 1 year, shortness of breath
in [place], hypertension

EXAM:
CHEST  2 VIEW

[w chest pa]
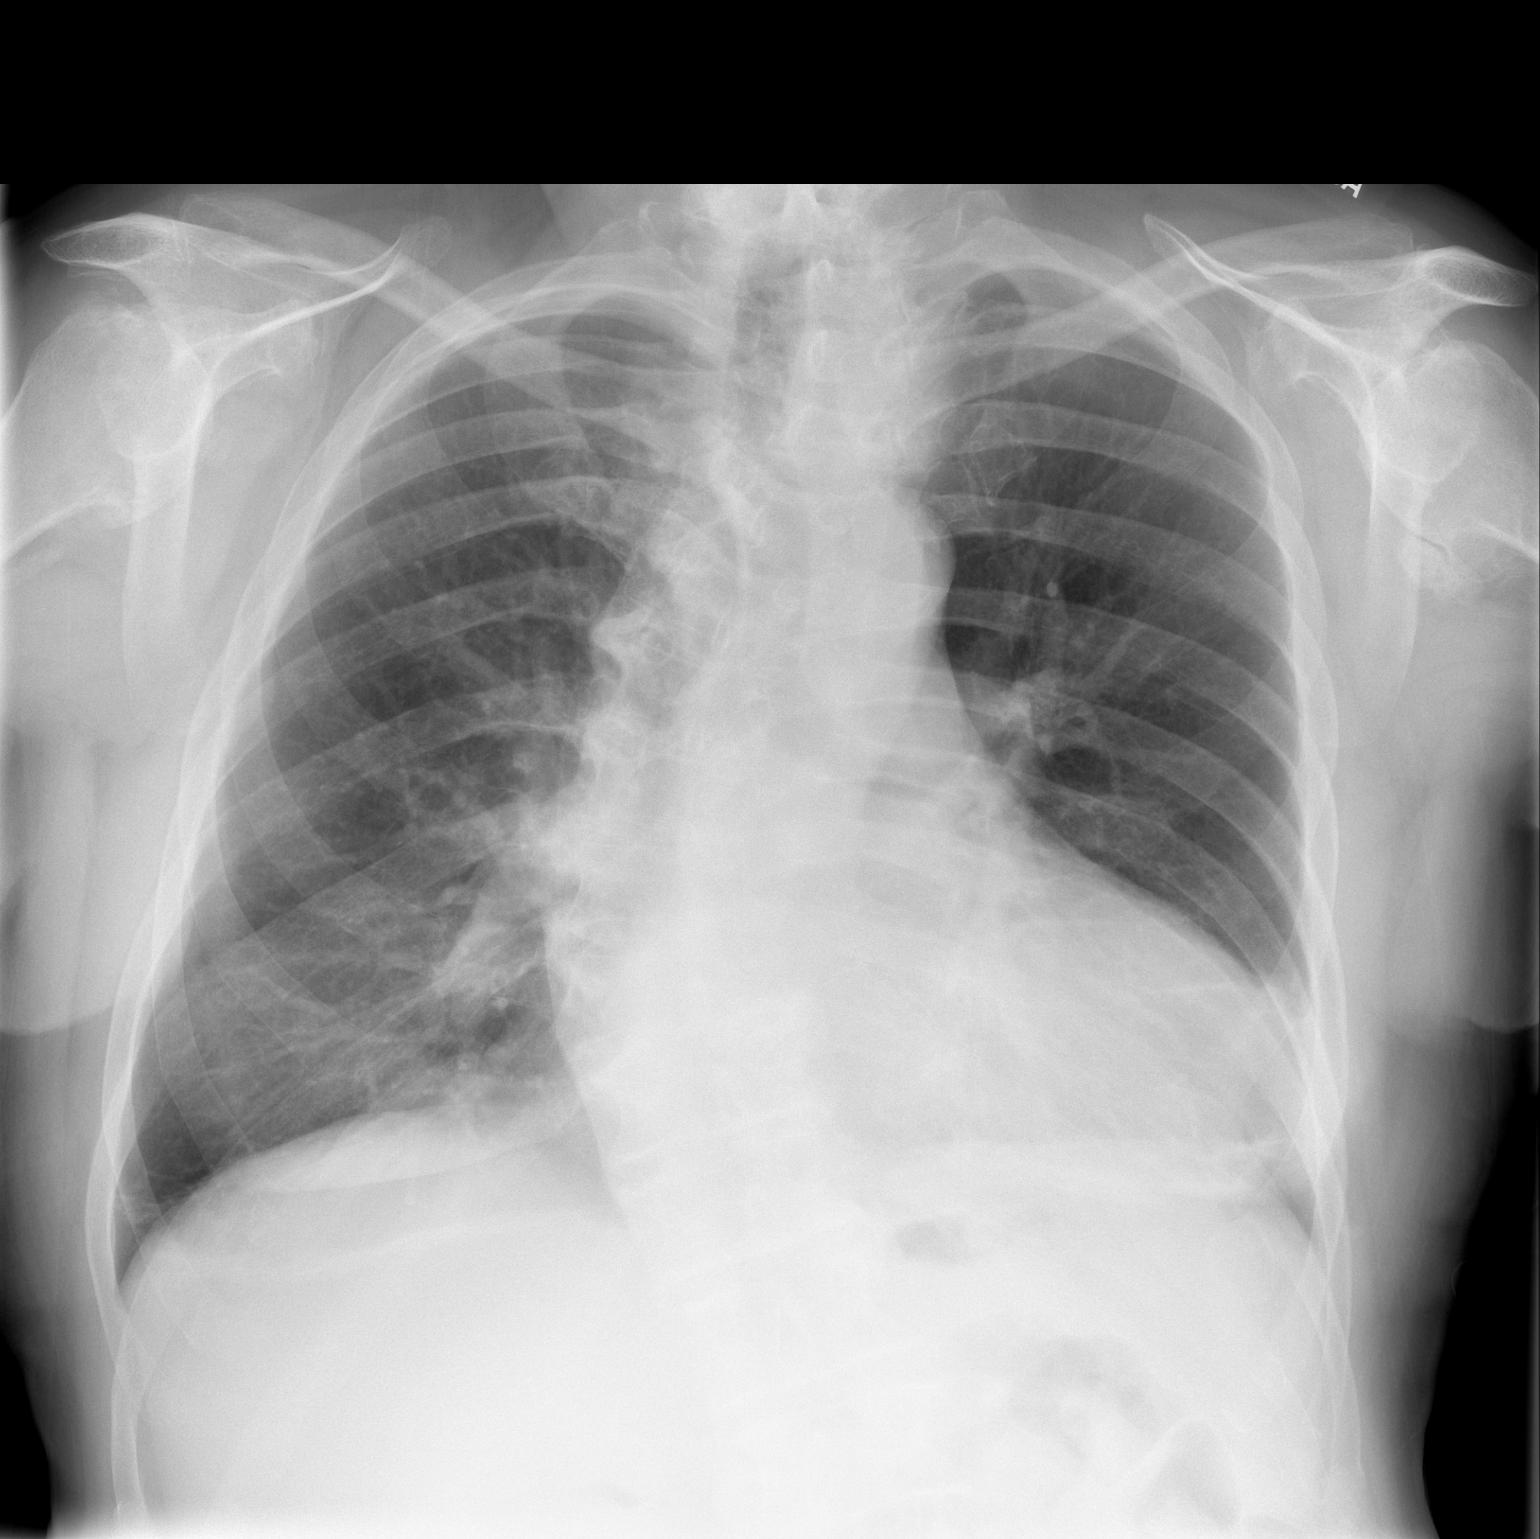

[w chest lat]
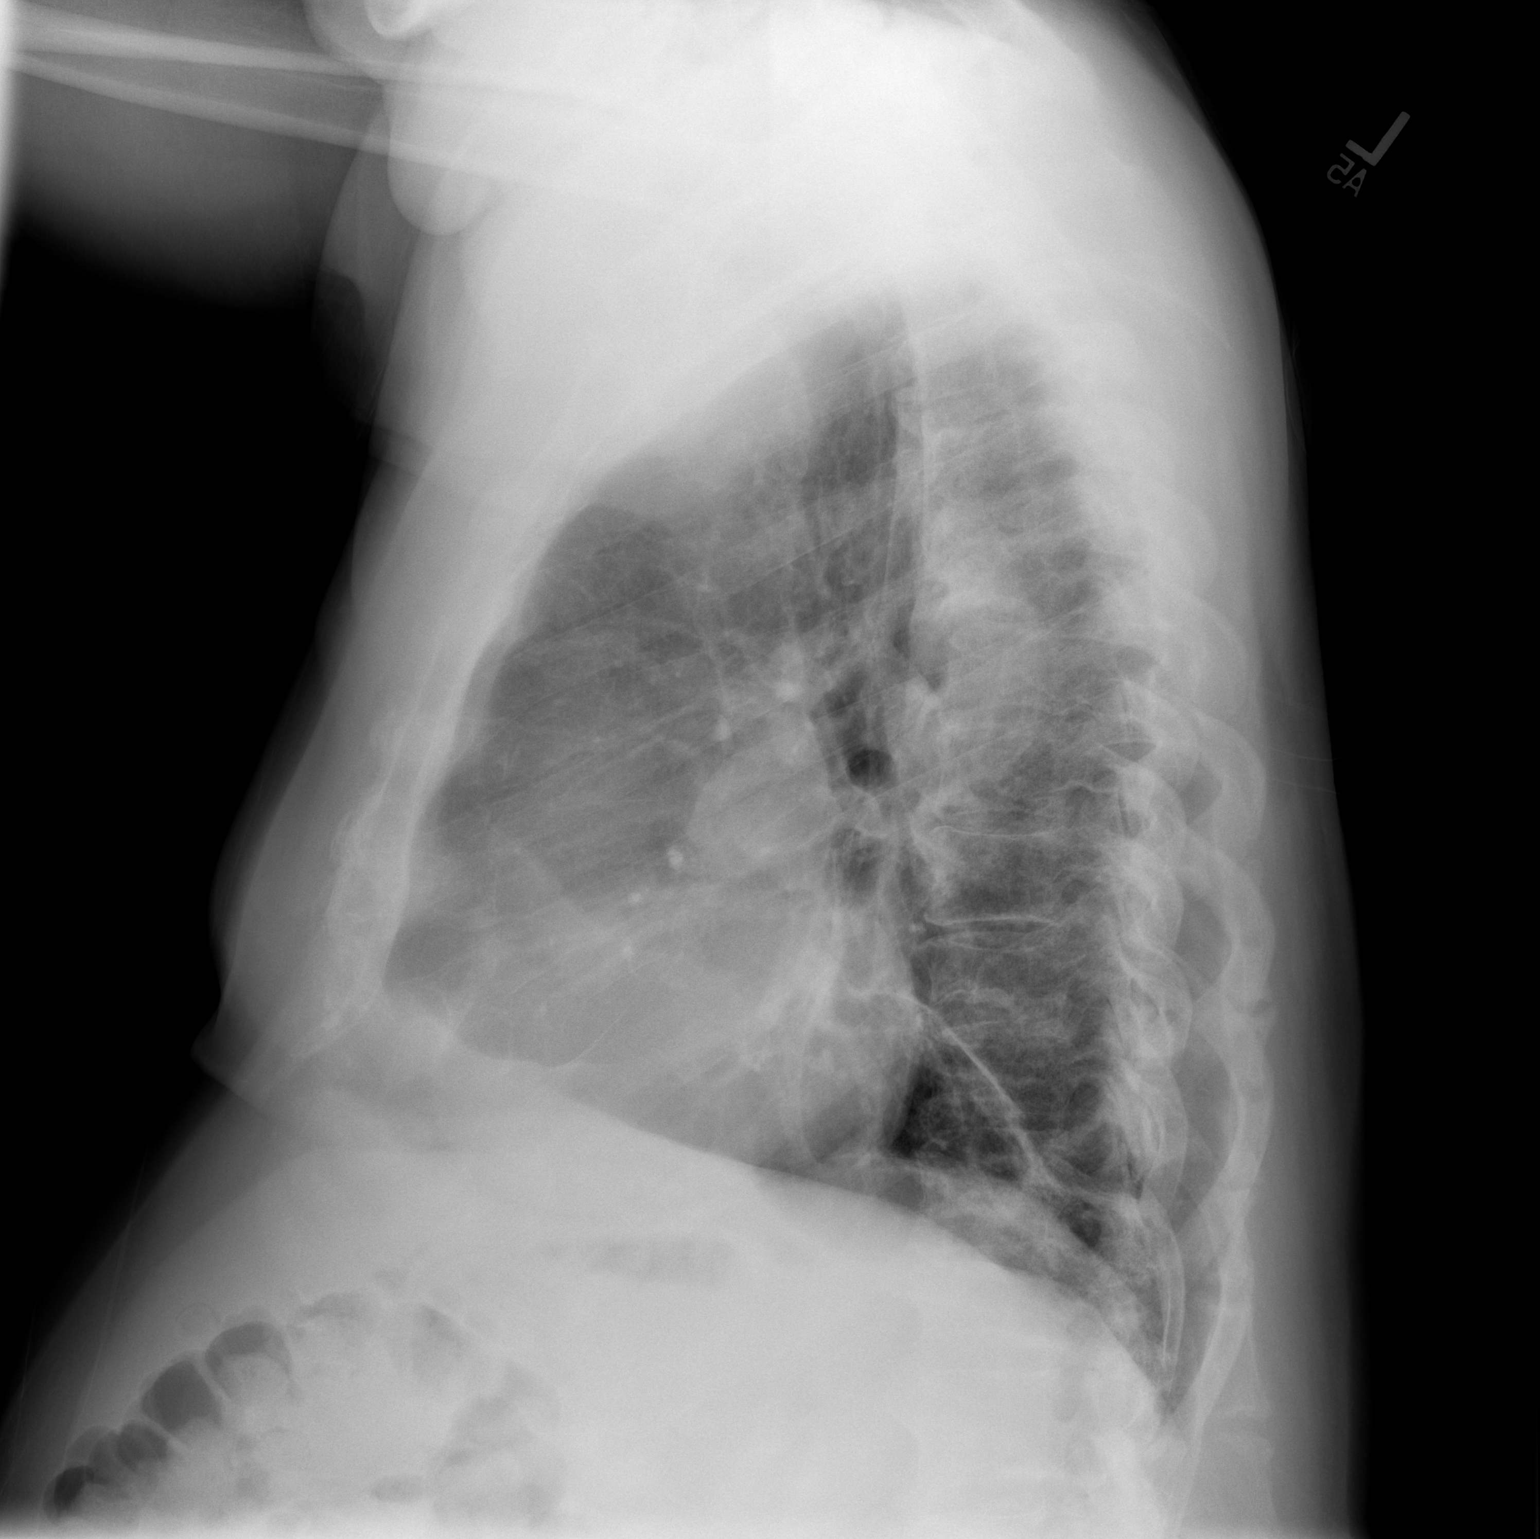

[2 of 2 positions shown; findings below may reference images not displayed]

FINDINGS: Enlargement of cardiac silhouette.

Mediastinal contours and pulmonary vascularity normal.

Atherosclerotic calcification aorta.

Minimal bibasilar atelectasis versus scarring.

Lungs otherwise clear.

No pleural effusion or pneumothorax.

Pronounced dextroconvex thoracic scoliosis.

BILATERAL glenohumeral degenerative changes and scattered
degenerative disc disease changes of the thoracic spine.
IMPRESSION: Enlargement of cardiac silhouette with minimal bibasilar atelectasis
versus scarring.

No additional acute abnormalities.

## 2019-03-18 DIAGNOSIS — G4733 Obstructive sleep apnea (adult) (pediatric): Secondary | ICD-10-CM | POA: Diagnosis not present

## 2019-03-19 DIAGNOSIS — Z0000 Encounter for general adult medical examination without abnormal findings: Secondary | ICD-10-CM | POA: Diagnosis not present

## 2019-03-19 DIAGNOSIS — I10 Essential (primary) hypertension: Secondary | ICD-10-CM | POA: Diagnosis not present

## 2019-03-19 DIAGNOSIS — G629 Polyneuropathy, unspecified: Secondary | ICD-10-CM | POA: Diagnosis not present

## 2019-03-19 DIAGNOSIS — Z1389 Encounter for screening for other disorder: Secondary | ICD-10-CM | POA: Diagnosis not present

## 2019-03-19 DIAGNOSIS — R269 Unspecified abnormalities of gait and mobility: Secondary | ICD-10-CM | POA: Diagnosis not present

## 2019-03-19 DIAGNOSIS — I350 Nonrheumatic aortic (valve) stenosis: Secondary | ICD-10-CM | POA: Diagnosis not present

## 2019-03-19 DIAGNOSIS — M109 Gout, unspecified: Secondary | ICD-10-CM | POA: Diagnosis not present

## 2019-03-19 DIAGNOSIS — N401 Enlarged prostate with lower urinary tract symptoms: Secondary | ICD-10-CM | POA: Diagnosis not present

## 2019-03-19 DIAGNOSIS — G4733 Obstructive sleep apnea (adult) (pediatric): Secondary | ICD-10-CM | POA: Diagnosis not present

## 2019-03-19 DIAGNOSIS — I2510 Atherosclerotic heart disease of native coronary artery without angina pectoris: Secondary | ICD-10-CM | POA: Diagnosis not present

## 2019-03-21 ENCOUNTER — Other Ambulatory Visit: Payer: Self-pay | Admitting: Family Medicine

## 2019-03-22 NOTE — Telephone Encounter (Signed)
Refill done.  

## 2019-03-28 ENCOUNTER — Ambulatory Visit (INDEPENDENT_AMBULATORY_CARE_PROVIDER_SITE_OTHER): Payer: Medicare Other | Admitting: Family Medicine

## 2019-03-28 ENCOUNTER — Ambulatory Visit: Payer: Medicare Other | Admitting: Family Medicine

## 2019-03-28 ENCOUNTER — Encounter: Payer: Self-pay | Admitting: Family Medicine

## 2019-03-28 DIAGNOSIS — M48062 Spinal stenosis, lumbar region with neurogenic claudication: Secondary | ICD-10-CM | POA: Diagnosis not present

## 2019-03-28 MED ORDER — GABAPENTIN 100 MG PO CAPS: 200.0000 mg | ORAL_CAPSULE | Freq: Every day | ORAL | 2 refills | Status: DC

## 2019-03-28 NOTE — Assessment & Plan Note (Signed)
Helps the gabapentin.  We discussed anywhere between 204 100 mg would be possible.  We discussed icing regimen and home exercises.  We discussed which activities of doing which wants to avoid.  Patient is to increase activity slowly.  We did discuss that worsening symptoms we can discuss the possibility of sacroiliac injections or the possibility of piriformis injections for more diagnostic but the likelihood will need to consider the epidurals again.  Patient will come back into the office when it is safe.

## 2019-03-28 NOTE — Progress Notes (Signed)
[name] [address] [place], [place] [address] Phone: [phone] Subjective:    Virtual Visit via Video Note  I connected with Patrick Franco on 03/28/19 at  2:30 PM EDT by a video enabled telemedicine application and verified that I am speaking with the correct person using two identifiers.  Location: Patient: Patient was in home setting Provider: I was in office setting   I discussed the limitations of evaluation and management by telemedicine and the availability of in person appointments. The patient expressed understanding and agreed to proceed.     I discussed the assessment and treatment plan with the patient. The patient was provided an opportunity to ask questions and all were answered. The patient agreed with the plan and demonstrated an understanding of the instructions.   The patient was advised to call back or seek an in-person evaluation if the symptoms worsen or if the condition fails to improve as anticipated.  I provided 16 minutes of face-to-face time during this encounter.   Lyndal Pulley, DO    CC: Refill gabapentin  [id number]  Patrick Franco is a 85 y.o. male coming in with complaint of refill gabapentin.  Known spinal stenosis.  Patient continues to have discomfort and pain.  Gabapentin does help.  Wants to talk about other possibilities.  Wants to avoid surgical intervention and did not feel that epidurals were helpful previously.  Wants to come into the office but at this time secondary to the coronavirus wants to avoid increasing risk.     Past Medical History:  Diagnosis Date  . Aortic stenosis   . BPH (benign prostatic hyperplasia)   . Chronic low back pain   . Depression   . Diverticulitis   . Elevated PSA   . Foot drop, right   . Foot drop, right   . Gait disorder 02/03/2015  . Hip pain   . History of colonoscopy   . HOH (hard of hearing)    hearing aid  . Hypertension   . Lumbar radiculopathy   .  Mitral valve prolapse   . Onychomycosis   . Peptic ulcer disease   . Sepsis ([place]) 2005   e. coli-after prostate bx  . Sleep apnea   . Torn ACL    Past Surgical History:  Procedure Laterality Date  . CATARACT EXTRACTION Bilateral   . ESOPHAGOGASTRODUODENOSCOPY N/A 08/19/2015   Procedure: ESOPHAGOGASTRODUODENOSCOPY (EGD);  Surgeon: Laurence Spates, MD;  Location: [facility] ENDOSCOPY;  Service: Endoscopy;  Laterality: N/A;  . right knee replacement Right 1996  . SHOULDER SURGERY    . TONSILLECTOMY     Social History   Socioeconomic History  . Marital status: Married    Spouse name: Not on file  . Number of children: 2  . Years of education: Not on file  . Highest education level: Not on file  Occupational History  . Occupation: Retired Social research officer, government  Social Needs  . Financial resource strain: Not on file  . Food insecurity:    Worry: Not on file    Inability: Not on file  . Transportation needs:    Medical: Not on file    Non-medical: Not on file  Tobacco Use  . Smoking status: Former Smoker    Packs/day: 0.50    Types: Cigarettes    Start date: 1960    Last attempt to quit: 1961    Years since quitting: 59.4  . Smokeless tobacco: Never Used  . 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 Alcohol use: Not on file  . Drug use: No  . Sexual activity: Yes  Lifestyle  . Physical activity:    Days per week: Not on file    Minutes per session: Not on file  . Stress: Not on file  Relationships  . Social connections:    Talks on phone: Not on file    Gets together: Not on file    Attends religious service: Not on file    Active member of club or organization: Not on file    Attends meetings of clubs or organizations: Not on file    Relationship status: Not on file  Other Topics Concern  . Not on file  Social History Narrative   Patient is right handed.   Patient drinks 1 cup of coffee per day.   No Known Allergies Family  History  Problem Relation Age of Onset  . Pneumonia Mother 836 . Cancer - Lung Father 523 . Multiple myeloma Brother 587    Current Outpatient Medications (Cardiovascular):  .  atorvastatin (LIPITOR) 10 MG tablet,  .  indapamide (LOZOL) 2.5 MG tablet, Take 2.5 mg by mouth daily. .  verapamil (CALAN-SR) 240 MG CR tablet, Take 240 mg by mouth daily.  Current Outpatient Medications (Respiratory):  .[name] Tiotropium Bromide-Olodaterol (STIOLTO RESPIMAT) 2.5-2.5 MCG/ACT AERS, Inhale 2 puffs into the lungs daily.  Current Outpatient Medications (Analgesics):  .  allopurinol (ZYLOPRIM) 100 MG tablet, Take 1 tablet (100 mg total) by mouth daily. .[name] aspirin EC 81 MG tablet, Take 1 tablet (81 mg total) by mouth daily. .  colchicine 0.6 MG tablet, Take 1 tablet (0.6 mg total) by mouth 2 (two) times daily as needed.   Current Outpatient Medications (Other):  .  dutasteride (AVODART) 0.5 MG capsule, Take 0.5 mg by mouth daily. .[name] esomeprazole (NEXIUM) 40 MG capsule, Take 1 capsule (40 mg total) by mouth 2 (two) times daily before a meal. .  gabapentin (NEURONTIN) 100 MG capsule, Take 2 capsules (200 mg total) by mouth at bedtime. .  mirtazapine (REMERON) 30 MG tablet, TAKE ONE TABLET AT BEDTIME. .  Multiple Vitamin (MULTIVITAMIN) tablet, Take 1 tablet by mouth daily. .  Omega-3 Fatty Acids (FISH OIL PO), Take 1,000 mg by mouth daily. .  Probiotic Product (PROBIOTIC DAILY PO), Take 1 tablet by mouth daily. .[name] Respiratory Therapy Supplies (FLUTTER) DEVI, Use the flutter valve 10 times when doing it daily. .  tamsulosin (FLOMAX) 0.4 MG CAPS capsule, Take 0.4 mg by mouth daily. .  Turmeric 500 MG CAPS, Take 500 mg by mouth daily. .  Vitamin D, Ergocalciferol, (DRISDOL) 1.25 MG (50000 UT) CAPS capsule, TAKE 1 CAPSULE WEEKLY.    Past medical history, social, surgical and family history all reviewed in electronic medical record.  No pertanent information unless stated regarding to the chief complaint.    Review of Systems:  No headache, visual changes, nausea, vomiting, diarrhea, constipation, dizziness, abdominal pain, skin rash, fevers, chills, night sweats, weight loss, swollen lymph nodes, body aches, joint swelling, , chest pain, shortness of breath, mood changes.  Positive muscle aches  Objective     General: No apparent distress alert and oriented x3 mood and affect normal, dressed appropriately.  HEENT: Pupils equal, extraocular movements intact  Respiratory: Patient's speak in full sentences and does not appear short of breath     Impression and Recommendations:  This case required medical decision making of moderate complexity. The above documentation has been reviewed and is accurate and complete Lyndal Pulley, DO       Note: This dictation was prepared with Dragon dictation along with smaller phrase technology. Any transcriptional errors that result from this process are unintentional.

## 2019-03-29 ENCOUNTER — Ambulatory Visit: Payer: Medicare Other | Admitting: Family Medicine

## 2019-04-01 ENCOUNTER — Other Ambulatory Visit: Payer: Self-pay | Admitting: Family Medicine

## 2019-04-02 DIAGNOSIS — L821 Other seborrheic keratosis: Secondary | ICD-10-CM | POA: Diagnosis not present

## 2019-04-02 DIAGNOSIS — L812 Freckles: Secondary | ICD-10-CM | POA: Diagnosis not present

## 2019-04-02 DIAGNOSIS — L720 Epidermal cyst: Secondary | ICD-10-CM | POA: Diagnosis not present

## 2019-04-02 DIAGNOSIS — L570 Actinic keratosis: Secondary | ICD-10-CM | POA: Diagnosis not present

## 2019-04-02 DIAGNOSIS — Z85828 Personal history of other malignant neoplasm of skin: Secondary | ICD-10-CM | POA: Diagnosis not present

## 2019-04-09 ENCOUNTER — Other Ambulatory Visit: Payer: Self-pay | Admitting: Family Medicine

## 2019-04-22 ENCOUNTER — Other Ambulatory Visit: Payer: Self-pay | Admitting: *Deleted

## 2019-04-22 DIAGNOSIS — M48062 Spinal stenosis, lumbar region with neurogenic claudication: Secondary | ICD-10-CM

## 2019-04-25 DIAGNOSIS — M545 Low back pain: Secondary | ICD-10-CM | POA: Diagnosis not present

## 2019-04-25 DIAGNOSIS — M48062 Spinal stenosis, lumbar region with neurogenic claudication: Secondary | ICD-10-CM | POA: Diagnosis not present

## 2019-04-25 DIAGNOSIS — M1712 Unilateral primary osteoarthritis, left knee: Secondary | ICD-10-CM | POA: Diagnosis not present

## 2019-04-30 DIAGNOSIS — M545 Low back pain: Secondary | ICD-10-CM | POA: Diagnosis not present

## 2019-04-30 DIAGNOSIS — M48062 Spinal stenosis, lumbar region with neurogenic claudication: Secondary | ICD-10-CM | POA: Diagnosis not present

## 2019-04-30 DIAGNOSIS — M1712 Unilateral primary osteoarthritis, left knee: Secondary | ICD-10-CM | POA: Diagnosis not present

## 2019-05-02 DIAGNOSIS — M48062 Spinal stenosis, lumbar region with neurogenic claudication: Secondary | ICD-10-CM | POA: Diagnosis not present

## 2019-05-02 DIAGNOSIS — M545 Low back pain: Secondary | ICD-10-CM | POA: Diagnosis not present

## 2019-05-02 DIAGNOSIS — M1712 Unilateral primary osteoarthritis, left knee: Secondary | ICD-10-CM | POA: Diagnosis not present

## 2019-05-06 ENCOUNTER — Other Ambulatory Visit: Payer: Self-pay | Admitting: Family Medicine

## 2019-05-07 DIAGNOSIS — M1712 Unilateral primary osteoarthritis, left knee: Secondary | ICD-10-CM | POA: Diagnosis not present

## 2019-05-07 DIAGNOSIS — M48062 Spinal stenosis, lumbar region with neurogenic claudication: Secondary | ICD-10-CM | POA: Diagnosis not present

## 2019-05-07 DIAGNOSIS — M545 Low back pain: Secondary | ICD-10-CM | POA: Diagnosis not present

## 2019-05-09 DIAGNOSIS — M1712 Unilateral primary osteoarthritis, left knee: Secondary | ICD-10-CM | POA: Diagnosis not present

## 2019-05-09 DIAGNOSIS — N403 Nodular prostate with lower urinary tract symptoms: Secondary | ICD-10-CM | POA: Diagnosis not present

## 2019-05-09 DIAGNOSIS — M48062 Spinal stenosis, lumbar region with neurogenic claudication: Secondary | ICD-10-CM | POA: Diagnosis not present

## 2019-05-09 DIAGNOSIS — M545 Low back pain: Secondary | ICD-10-CM | POA: Diagnosis not present

## 2019-05-09 DIAGNOSIS — N401 Enlarged prostate with lower urinary tract symptoms: Secondary | ICD-10-CM | POA: Diagnosis not present

## 2019-05-09 DIAGNOSIS — R350 Frequency of micturition: Secondary | ICD-10-CM | POA: Diagnosis not present

## 2019-05-21 DIAGNOSIS — M1712 Unilateral primary osteoarthritis, left knee: Secondary | ICD-10-CM | POA: Diagnosis not present

## 2019-05-21 DIAGNOSIS — M48062 Spinal stenosis, lumbar region with neurogenic claudication: Secondary | ICD-10-CM | POA: Diagnosis not present

## 2019-05-21 DIAGNOSIS — M545 Low back pain: Secondary | ICD-10-CM | POA: Diagnosis not present

## 2019-05-21 NOTE — Progress Notes (Signed)
[facility] [address], [place] [address] Phone: [phone] Subjective:   I Patrick Franco am serving as a Education administrator for Dr. Hulan Saas.  CC: Back and hip pain  GBT:[id number]  Patrick Franco is a 85 y.o. male coming in with complaint of back pain. States he is in pain today. He is in pain all the time. Patient has had known lumbar spinal stenosis and has responded to epidurals.  Last epidural was 1 year ago.  Started having worsening pain again.  States that it seems to be more localized to the right side of the hip.  Worse after sitting long amount of time and is well at night.  Patient has been starting to walk in a pool on a more regular basis.  Rates the severity of pain that was 7 out of 10.  Some mild weakness of the legs if he stands too long right greater than left.  Patient is not having as much foot drop as he had previously about.     Past Medical History:  Diagnosis Date  . Aortic stenosis   . BPH (benign prostatic hyperplasia)   . Chronic low back pain   . Depression   . Diverticulitis   . Elevated PSA   . Foot drop, right   . Foot drop, right   . Gait disorder 02/03/2015  . Hip pain   . History of colonoscopy   . HOH (hard of hearing)    hearing aid  . Hypertension   . Lumbar radiculopathy   . Mitral valve prolapse   . Onychomycosis   . Peptic ulcer disease   . Sepsis ([place]) 2005   e. coli-after prostate bx  . Sleep apnea   . Torn ACL    Past Surgical History:  Procedure Laterality Date  . CATARACT EXTRACTION Bilateral   . ESOPHAGOGASTRODUODENOSCOPY N/A 08/19/2015   Procedure: ESOPHAGOGASTRODUODENOSCOPY (EGD);  Surgeon: Laurence Spates, MD;  Location: [facility] ENDOSCOPY;  Service: Endoscopy;  Laterality: N/A;  . right knee replacement Right 1996  . SHOULDER SURGERY    . TONSILLECTOMY     Social History   Socioeconomic History  . Marital status: Married    Spouse name: Not on file  . Number of children: 2  . Years of  education: Not on file  . Highest education level: Not on file  Occupational History  . Occupation: Retired Social research officer, government  Social Needs  . Financial resource strain: Not on file  . Food insecurity    Worry: Not on file    Inability: Not on file  . Transportation needs    Medical: Not on file    Non-medical: Not on file  Tobacco Use  . Smoking status: Former Smoker    Packs/day: 0.50    Types: Cigarettes    Start date: 1960    Quit date: 1961    Years since quitting: 59.5  . Smokeless tobacco: Never Used  . 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 Alcohol use: Not on file  . Drug use: No  . Sexual activity: Yes  Lifestyle  . Physical activity    Days per week: Not on file    Minutes per session: Not on file  . Stress: Not on file  Relationships  . Social [place] phone: Not on file    Gets together: Not on file    Attends religious service:  Not on file    Active member of club or organization: Not on file    Attends meetings of clubs or organizations: Not on file    Relationship status: Not on file  Other Topics Concern  . Not on file  Social History Narrative   Patient is right handed.   Patient drinks 1 cup of coffee per day.   No Known Allergies Family History  Problem Relation Age of Onset  . Pneumonia Mother 18  . Cancer - Lung Father 77  . Multiple myeloma Brother 20     Current Outpatient Medications (Cardiovascular):  .  atorvastatin (LIPITOR) 10 MG tablet,  .  indapamide (LOZOL) 2.5 MG tablet, Take 2.5 mg by mouth daily. .  verapamil (CALAN-SR) 240 MG CR tablet, Take 240 mg by mouth daily.  Current Outpatient Medications (Respiratory):  [name]  Tiotropium Bromide-Olodaterol (STIOLTO RESPIMAT) 2.5-2.5 MCG/ACT AERS, Inhale 2 puffs into the lungs daily.  Current Outpatient Medications (Analgesics):  .  allopurinol (ZYLOPRIM) 100 MG tablet, TAKE 1 TABLET ONCE DAILY. [name]  aspirin EC 81 MG  tablet, Take 1 tablet (81 mg total) by mouth daily. .  colchicine 0.6 MG tablet, Take 1 tablet (0.6 mg total) by mouth 2 (two) times daily as needed.   Current Outpatient Medications (Other):  .  dutasteride (AVODART) 0.5 MG capsule, Take 0.5 mg by mouth daily. [name]  esomeprazole (NEXIUM) 40 MG capsule, Take 1 capsule (40 mg total) by mouth 2 (two) times daily before a meal. .  gabapentin (NEURONTIN) 100 MG capsule, Take 2 capsules (200 mg total) by mouth at bedtime. .  mirtazapine (REMERON) 30 MG tablet, TAKE ONE TABLET AT BEDTIME. .  Multiple Vitamin (MULTIVITAMIN) tablet, Take 1 tablet by mouth daily. .  Omega-3 Fatty Acids (FISH OIL PO), Take 1,000 mg by mouth daily. .  Probiotic Product (PROBIOTIC DAILY PO), Take 1 tablet by mouth daily. [name]  Respiratory Therapy Supplies (FLUTTER) DEVI, Use the flutter valve 10 times when doing it daily. .  tamsulosin (FLOMAX) 0.4 MG CAPS capsule, Take 0.4 mg by mouth daily. .  Turmeric 500 MG CAPS, Take 500 mg by mouth daily. .  Vitamin D, Ergocalciferol, (DRISDOL) 1.25 MG (50000 UT) CAPS capsule, TAKE 1 CAPSULE WEEKLY.    Past medical history, social, surgical and family history all reviewed in electronic medical record.  No pertanent information unless stated regarding to the chief complaint.   Review of Systems:  No headache, visual changes, nausea, vomiting, diarrhea, constipation, dizziness, abdominal pain, skin rash, fevers, chills, night sweats, weight loss, swollen lymph nodes,  chest pain, shortness of breath, mood changes.  Positive muscle aches, joint swelling, body aches  Objective  Blood pressure (!) 150/80, pulse 93, height 5' 10"  (1.778 m), SpO2 97 %.    General: No apparent distress alert and oriented x2 mood and affect normal, dressed appropriately.  Patient is accompanied with wife HEENT: Pupils equal, extraocular movements intact  Respiratory: Patient's speak in full sentences and does not appear short of breath  Cardiovascular:  Trace lower extremity edema, non tender, no erythema  Skin: Warm dry intact with no signs of infection or rash on extremities or on axial skeleton.  Abdomen: Soft nontender  Neuro: Cranial nerves II through XII are intact, neurovascularly intact in all extremities with 2+ DTRs and 2+ pulses.  Lymph: No lymphadenopathy of posterior or anterior cervical chain or axillae bilaterally.  Gait antalgic with mild shuffling MSK:  tender with full range of motion and good stability  and symmetric strength and tone of shoulders, elbows, wrist, , knee and ankles bilaterally.   Arthriti changes of multiple joints but patient does have a right knee replacement Right hip exam shows and does have decreased range of motion in all planes with 10 degrees of internal rotation and 35 degrees of external rotation.  No tenderness to palpation over the right greater trochanteric area.  Negative straight leg test but does have 4+ out of 5 strength of dorsiflexion of the foot compared to contralateral side.   Procedure: Real-time Ultrasound Guided Injection of right greater trochanteric bursitis secondary to patient's body habitus Device: GE Logiq Q7 Ultrasound guided injection is preferred based studies that show increased duration, increased effect, greater accuracy, decreased procedural pain, increased response rate, and decreased cost with ultrasound guided versus blind injection.  Verbal informed consent obtained.  Time-out conducted.  Noted no overlying erythema, induration, or other signs of local infection.  Skin prepped in a sterile fashion.  Local anesthesia: Topical Ethyl chloride.  With sterile technique and under real time ultrasound guidance:  Greater trochanteric area was visualized and patient's bursa was noted. A 22-gauge 3 inch needle was inserted and 4 cc of 0.5% Marcaine and 1 cc of Kenalog 40 mg/dL was injected. Pictures taken Completed without difficulty  Pain immediately resolved suggesting accurate  placement of the medication.  Advised to call if fevers/chills, erythema, induration, drainage, or persistent bleeding.  Images permanently stored and available for review in the ultrasound unit.  Impression: Technically successful ultrasound guided injection.   Impression and Recommendations:     This case required medical decision making of moderate complexity. The above documentation has been reviewed and is accurate and complete Lyndal Pulley, DO       Note: This dictation was prepared with Dragon dictation along with smaller phrase technology. Any transcriptional errors that result from this process are unintentional.

## 2019-05-22 ENCOUNTER — Ambulatory Visit: Payer: Self-pay

## 2019-05-22 ENCOUNTER — Other Ambulatory Visit: Payer: Self-pay

## 2019-05-22 ENCOUNTER — Ambulatory Visit (INDEPENDENT_AMBULATORY_CARE_PROVIDER_SITE_OTHER): Payer: Medicare Other | Admitting: Family Medicine

## 2019-05-22 ENCOUNTER — Encounter: Payer: Self-pay | Admitting: Family Medicine

## 2019-05-22 VITALS — BP 150/80 | HR 93 | Ht 70.0 in

## 2019-05-22 DIAGNOSIS — M48062 Spinal stenosis, lumbar region with neurogenic claudication: Secondary | ICD-10-CM | POA: Diagnosis not present

## 2019-05-22 DIAGNOSIS — M25551 Pain in right hip: Secondary | ICD-10-CM

## 2019-05-22 DIAGNOSIS — M7061 Trochanteric bursitis, right hip: Secondary | ICD-10-CM | POA: Diagnosis not present

## 2019-05-22 NOTE — Assessment & Plan Note (Signed)
Patient given injection today.  Tolerated the procedure well.  Discussed icing regimen and home exercise.  Discussed which activities to avoid.  Patient is to increase activity slowly.  I do believe that a decent amount of patient's knee pain is secondary to the lumbar spinal stenosis and a possible epidural would be beneficial again.  Patient's last MRI is within 3 years.  Patient has had some weakness of foot drop previously but seems to be stable at the moment.

## 2019-05-22 NOTE — Patient Instructions (Signed)
Good to see you  Ice 20 minutes 2 times daily. Usually after activity and before bed.  Injected right hip outside today  If not better in 2 weeks give [place] a call and we will order the epidural again  Keep doing the vitamins and love the pool!

## 2019-05-23 DIAGNOSIS — M545 Low back pain: Secondary | ICD-10-CM | POA: Diagnosis not present

## 2019-05-23 DIAGNOSIS — M48062 Spinal stenosis, lumbar region with neurogenic claudication: Secondary | ICD-10-CM | POA: Diagnosis not present

## 2019-05-23 DIAGNOSIS — M1712 Unilateral primary osteoarthritis, left knee: Secondary | ICD-10-CM | POA: Diagnosis not present

## 2019-05-28 ENCOUNTER — Telehealth: Payer: Self-pay | Admitting: *Deleted

## 2019-05-28 DIAGNOSIS — M1712 Unilateral primary osteoarthritis, left knee: Secondary | ICD-10-CM | POA: Diagnosis not present

## 2019-05-28 DIAGNOSIS — M48062 Spinal stenosis, lumbar region with neurogenic claudication: Secondary | ICD-10-CM | POA: Diagnosis not present

## 2019-05-28 DIAGNOSIS — M545 Low back pain: Secondary | ICD-10-CM | POA: Diagnosis not present

## 2019-05-28 NOTE — Telephone Encounter (Signed)
   Appointment with Dr. Angelena Form on July 23,2020  COVID-19 Pre-Screening Questions:  . In the past 7 to 10 days have you had a cough,  shortness of breath, headache, congestion, fever (100 or greater) body aches, chills, sore throat, or sudden loss of taste or sense of smell? no . Have you been around anyone with known Covid 19. no . Have you been around anyone who is awaiting Covid 19 test results in the past 7 to 10 days? no . Have you been around anyone who has been exposed to Covid 19, or has mentioned symptoms of Covid 19 within the past 7 to 10 days? no  If you have any concerns/questions about symptoms patients report during screening (either on the phone or at threshold). Contact the provider seeing the patient or DOD for further guidance.  If neither are available contact a member of the leadership team.  Pt is currently scheduled for virtual visit with Dr. Angelena Form on July 23,2020.  Dr. Angelena Form will be in office that day.  I spoke with pt and he would like in office visit. Pt aware to contact our office in any changes in screening questions prior to appointment. Pt has mask and is aware to wear to appointment. Pt aware to arrive 15 minutes prior to appointment and that no one can accompany him into appointment.

## 2019-05-30 DIAGNOSIS — M1712 Unilateral primary osteoarthritis, left knee: Secondary | ICD-10-CM | POA: Diagnosis not present

## 2019-05-30 DIAGNOSIS — M545 Low back pain: Secondary | ICD-10-CM | POA: Diagnosis not present

## 2019-05-30 DIAGNOSIS — M48062 Spinal stenosis, lumbar region with neurogenic claudication: Secondary | ICD-10-CM | POA: Diagnosis not present

## 2019-06-04 DIAGNOSIS — M1712 Unilateral primary osteoarthritis, left knee: Secondary | ICD-10-CM | POA: Diagnosis not present

## 2019-06-04 DIAGNOSIS — M48062 Spinal stenosis, lumbar region with neurogenic claudication: Secondary | ICD-10-CM | POA: Diagnosis not present

## 2019-06-04 DIAGNOSIS — M545 Low back pain: Secondary | ICD-10-CM | POA: Diagnosis not present

## 2019-06-05 ENCOUNTER — Telehealth: Payer: Self-pay | Admitting: *Deleted

## 2019-06-05 NOTE — Telephone Encounter (Signed)
Patient left msg stating that his hip his still hurting after the injection he received on 05/22/19 & would like to know what his other options are.

## 2019-06-06 ENCOUNTER — Ambulatory Visit (INDEPENDENT_AMBULATORY_CARE_PROVIDER_SITE_OTHER): Payer: Medicare Other | Admitting: Cardiovascular Disease

## 2019-06-06 ENCOUNTER — Encounter: Payer: Self-pay | Admitting: Cardiovascular Disease

## 2019-06-06 ENCOUNTER — Other Ambulatory Visit: Payer: Self-pay

## 2019-06-06 VITALS — BP 156/98 | HR 83 | Ht 70.0 in | Wt 208.0 lb

## 2019-06-06 DIAGNOSIS — I350 Nonrheumatic aortic (valve) stenosis: Secondary | ICD-10-CM | POA: Diagnosis not present

## 2019-06-06 DIAGNOSIS — M1712 Unilateral primary osteoarthritis, left knee: Secondary | ICD-10-CM | POA: Diagnosis not present

## 2019-06-06 DIAGNOSIS — M48062 Spinal stenosis, lumbar region with neurogenic claudication: Secondary | ICD-10-CM | POA: Diagnosis not present

## 2019-06-06 DIAGNOSIS — M545 Low back pain: Secondary | ICD-10-CM | POA: Diagnosis not present

## 2019-06-06 DIAGNOSIS — I491 Atrial premature depolarization: Secondary | ICD-10-CM | POA: Diagnosis not present

## 2019-06-06 DIAGNOSIS — I340 Nonrheumatic mitral (valve) insufficiency: Secondary | ICD-10-CM | POA: Diagnosis not present

## 2019-06-06 DIAGNOSIS — I2510 Atherosclerotic heart disease of native coronary artery without angina pectoris: Secondary | ICD-10-CM | POA: Diagnosis not present

## 2019-06-06 NOTE — Progress Notes (Signed)
Chief Complaint  Patient presents with  . Follow-up    CAD   History of Present Illness: 85 yo male with history of CAD, aortic stenosis, HTN, mitral valve prolapse, sleep apnea here today for cardiac follow up. I saw him as a new consult for evaluation of aortic stenosis and abnormal EKG in May 2018. He was having a colonoscopy on 04/04/17 and was found to have an irregular heart rhythm. He was sent to his primary care office where an EKG showed sinus rhythm with PACs. Echo November 2017 with normal LV systolic function, FMBB=40-37%, grade 1 diastolic dysfunction, functionally bicuspid aortic valve with moderate stenosis (mean gradient 21 mmHg, AVA 1.4cm2). There was mild mitral regurgitation. At his first visit here in May 2018, he described dyspnea and less energy but no chest pain or dizziness. Nuclear stress test May 2018 with no ischemia. Echo May 2019 showed normal LV systolic function, mild aortic valve stenosis (mean gradient 16 mmHg), moderate AI and mild mitral valve regurgitation with MVP. He has been started on ASA and a statin due to finding of CAD on the chest CT.   He is here today for follow up. The patient denies any chest pain, dyspnea, palpitations, lower extremity edema, orthopnea, PND, dizziness, near syncope or syncope. He quit playing tennis last year due to his neuropathy. He still exercises and swims three days per week.    Primary Care Physician: Lavone Orn, MD   Past Medical History:  Diagnosis Date  . Aortic stenosis   . BPH (benign prostatic hyperplasia)   . Chronic low back pain   . Depression   . Diverticulitis   . Elevated PSA   . Foot drop, right   . Foot drop, right   . Gait disorder 02/03/2015  . Hip pain   . History of colonoscopy   . HOH (hard of hearing)    hearing aid  . Hypertension   . Lumbar radiculopathy   . Mitral valve prolapse   . Onychomycosis   . Peptic ulcer disease   . Sepsis ([place]) 2005   e. coli-after prostate bx  . Sleep  apnea   . Torn ACL     Past Surgical History:  Procedure Laterality Date  . CATARACT EXTRACTION Bilateral   . ESOPHAGOGASTRODUODENOSCOPY N/A 08/19/2015   Procedure: ESOPHAGOGASTRODUODENOSCOPY (EGD);  Surgeon: Laurence Spates, MD;  Location: [facility] ENDOSCOPY;  Service: Endoscopy;  Laterality: N/A;  . right knee replacement Right 1996  . SHOULDER SURGERY    . TONSILLECTOMY      Current Outpatient Medications  Medication Sig Dispense Refill  . allopurinol (ZYLOPRIM) 100 MG tablet TAKE 1 TABLET ONCE DAILY. 90 tablet 0  . aspirin EC 81 MG tablet Take 1 tablet (81 mg total) by mouth daily. 90 tablet 3  . atorvastatin (LIPITOR) 10 MG tablet   10  . colchicine 0.6 MG tablet Take 1 tablet (0.6 mg total) by mouth 2 (two) times daily as needed. 30 tablet 2  . dutasteride (AVODART) 0.5 MG capsule Take 0.5 mg by mouth daily.  2  . esomeprazole (NEXIUM) 40 MG capsule Take 1 capsule (40 mg total) by mouth 2 (two) times daily before a meal. 60 capsule 0  . gabapentin (NEURONTIN) 100 MG capsule Take 2 capsules (200 mg total) by mouth at bedtime. 180 capsule 2  . indapamide (LOZOL) 2.5 MG tablet Take 2.5 mg by mouth daily.  1  . mirtazapine (REMERON) 30 MG tablet TAKE ONE TABLET AT BEDTIME. [place]  tablet 0  . Multiple Vitamin (MULTIVITAMIN) tablet Take 1 tablet by mouth daily.    . Omega-3 Fatty Acids (FISH OIL PO) Take 1,000 mg by mouth daily.    . Probiotic Product (PROBIOTIC DAILY PO) Take 1 tablet by mouth daily.    [name] Respiratory Therapy Supplies (FLUTTER) DEVI Use the flutter valve 10 times when doing it daily. 1 each 0  . tamsulosin (FLOMAX) 0.4 MG CAPS capsule Take 0.4 mg by mouth daily.  2  . Tiotropium Bromide-Olodaterol (STIOLTO RESPIMAT) 2.5-2.5 MCG/ACT AERS Inhale 2 puffs into the lungs daily. 1 Inhaler 0  . Turmeric 500 MG CAPS Take 500 mg by mouth daily.    . verapamil (CALAN-SR) 240 MG CR tablet Take 240 mg by mouth daily.  2  . Vitamin D, Ergocalciferol, (DRISDOL) 1.25 MG (50000 UT) CAPS capsule  TAKE 1 CAPSULE WEEKLY. 12 capsule 0   No current facility-administered medications for this visit.     No Known Allergies  Social History   Socioeconomic History  . Marital status: Married    Spouse name: Not on file  . Number of children: 2  . Years of education: Not on file  . Highest education level: Not on file  Occupational History  . Occupation: Retired Social research officer, government  Social Needs  . Financial resource strain: Not on file  . Food insecurity    Worry: Not on file    Inability: Not on file  . Transportation needs    Medical: Not on file    Non-medical: Not on file  Tobacco Use  . Smoking status: Former Smoker    Packs/day: 0.50    Types: Cigarettes    Start date: 1960    Quit date: 1961    Years since quitting: 59.5  . Smokeless tobacco: Never Used  . 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 Alcohol use: Not on file  . Drug use: No  . Sexual activity: Yes  Lifestyle  . Physical activity    Days per week: Not on file    Minutes per session: Not on file  . Stress: Not on file  Relationships  . Social [place] phone: Not on file    Gets together: Not on file    Attends religious service: Not on file    Active member of club or organization: Not on file    Attends meetings of clubs or organizations: Not on file    Relationship status: Not on file  . Intimate partner violence    Fear of current or ex partner: Not on file    Emotionally abused: Not on file    Physically abused: Not on file    Forced sexual activity: Not on file  Other Topics Concern  . Not on file  Social History Narrative   Patient is right handed.   Patient drinks 1 cup of coffee per day.    Family History  Problem Relation Age of Onset  . Pneumonia Mother 823 . Cancer - Lung Father 593 . Multiple myeloma Brother 566   Review of Systems:  As stated in the HPI and otherwise negative.   BP (!) 156/98   Pulse 83   Ht  5' 10"  (1.778 m)   Wt 208 lb (94.3 kg)   SpO2 97%   BMI 29.84 kg/m   Physical Examination:  General: Well developed, well nourished, NAD  HEENT: OP clear,  mucus membranes moist  SKIN: warm, dry. No rashes. Neuro: No focal deficits  Musculoskeletal: Muscle strength 5/5 all ext  Psychiatric: Mood and affect normal  Neck: No JVD, no carotid bruits, no thyromegaly, no lymphadenopathy.  Lungs:Clear bilaterally, no wheezes, rhonci, crackles Cardiovascular: Regular rate and rhythm. Systolic murmur noted.  Abdomen:Soft. Bowel sounds present. Non-tender.  Extremities: No lower extremity edema. Pulses are 2 + in the bilateral DP/PT.  Echo May 2019:  Left ventricle: The cavity size was normal. There was severe   focal basal and moderate concentric hypertrophy. Systolic   function was normal. The estimated ejection fraction was in the   range of 60% to 65%. Wall motion was normal; there were no   regional wall motion abnormalities. There was an increased   relative contribution of atrial contraction to ventricular   filling. Doppler parameters are consistent with abnormal left   ventricular relaxation (grade 1 diastolic dysfunction). Doppler   parameters are consistent with high ventricular filling pressure. - Aortic valve: Moderately calcified annulus. Trileaflet;   moderately thickened, moderately calcified leaflets. Valve   mobility was restricted. There was mild to moderate stenosis.   There was moderate regurgitation. Regurgitation pressure   half-time: 354 ms. - Aorta: Aortic root dimension: 39 mm (ED). - Aortic root: The aortic root was mildly dilated. - Mitral valve: Mild, late systolicprolapse, involving the anterior   leaflet and the posterior leaflet. There was mild regurgitation. - Left atrium: The atrium was moderately dilated. - Right ventricle: The cavity size was mildly dilated. Wall   thickness was normal. - Atrial septum: There was increased thickness of the septum,    consistent with lipomatous hypertrophy.  EKG:  EKG is ordered today. The ekg ordered today demonstrates Sinus with PAC.  Recent Labs: No results found for requested labs within last 8760 hours.   Lipid Panel No results found for: CHOL, TRIG, HDL, CHOLHDL, VLDL, LDLCALC, LDLDIRECT   Wt Readings from Last 3 Encounters:  06/06/19 208 lb (94.3 kg)  12/14/18 205 lb (93 kg)  11/13/18 209 lb (94.8 kg)     Other studies Reviewed: Additional studies/ records that were reviewed today include: . Review of the above records demonstrates:   Assessment and Plan:   1. Aortic stenosis/aortic insufficiency: Mild to moderate by echo May 2019 (mean gradient 16 mmHg). There is moderate AI. Will repeat echo now.   2. Premature atrial contractions: He has no palpitations. Will continue verapamil.   3. Mitral regurgitation: Mild by echo May 2019   4. CAD without angina: He has had no cardiac cath. Chest CT in February 2019 with finding of coronary atherosclerosis. Nuclear stress test in 2018 with no ischemia. Will continue ASA and statin.    Current medicines are reviewed at length with the patient today.  The patient does not have concerns regarding medicines.  The following changes have been made:  no change  Labs/ tests ordered today include:   Orders Placed This Encounter  Procedures  . ECHOCARDIOGRAM COMPLETE     Disposition:   FU with me in 12 months   Signed, [name], MD 06/06/2019 9:29 AM    [facility] [address], [place], [place]  [address] Phone: [phone]; Fax: [phone]

## 2019-06-06 NOTE — Patient Instructions (Signed)
Medication Instructions:  Your physician recommends that you continue on your current medications as directed. Please refer to the Current Medication list given to you today.  If you need a refill on your cardiac medications before your next appointment, please call your pharmacy.   Lab work: None If you have labs (blood work) drawn today and your tests are completely normal, you will receive your results only by: [name] MyChart Message (if you have MyChart) OR . A paper copy in the mail If you have any lab test that is abnormal or we need to change your treatment, we will call you to review the results.  Testing/Procedures: Your physician has requested that you have an echocardiogram. Echocardiography is a painless test that uses sound waves to create images of your heart. It provides your doctor with information about the size and shape of your heart and how well your heart's chambers and valves are working. This procedure takes approximately one hour. There are no restrictions for this procedure.    Follow-Up: At [facility], you and your health needs are our priority.  As part of our continuing mission to provide you with exceptional heart care, we have created designated Provider Care Teams.  These Care Teams include your primary Cardiologist (physician) and Advanced Practice Providers (APPs -  Physician Assistants and Nurse Practitioners) who all work together to provide you with the care you need, when you need it. You will need a follow up appointment in 12 months.  Please call our office 2 months in advance to schedule this appointment.  You may see [name], MD or one of the following Advanced Practice Providers on your designated Care Team:   [place], PA-C Melina Copa, PA-C . Ermalinda Barrios, PA-C  Any Other Special Instructions Will Be Listed Below (If Applicable).

## 2019-06-11 DIAGNOSIS — M1712 Unilateral primary osteoarthritis, left knee: Secondary | ICD-10-CM | POA: Diagnosis not present

## 2019-06-11 DIAGNOSIS — M545 Low back pain: Secondary | ICD-10-CM | POA: Diagnosis not present

## 2019-06-11 DIAGNOSIS — M48062 Spinal stenosis, lumbar region with neurogenic claudication: Secondary | ICD-10-CM | POA: Diagnosis not present

## 2019-06-11 NOTE — Telephone Encounter (Signed)
We can try one more injection if he wants.  Maybe next week or a cancellation

## 2019-06-11 NOTE — Telephone Encounter (Signed)
Spoke to Patrick Franco, advised him Dr. Tamala Julian recommended to try an epidural injection or we can referral him to Neurosurgery. Patrick Franco stated that he feels like the pain is in one specific spot on the outside of this hip. Patrick Franco has been going to [facility] and he said the physical therapist thinks it is coming from his hip as well.   Any suggestions?

## 2019-06-12 ENCOUNTER — Ambulatory Visit: Payer: Self-pay

## 2019-06-12 ENCOUNTER — Ambulatory Visit (HOSPITAL_COMMUNITY): Payer: Medicare Other | Attending: Internal Medicine

## 2019-06-12 ENCOUNTER — Ambulatory Visit (INDEPENDENT_AMBULATORY_CARE_PROVIDER_SITE_OTHER)
Admission: RE | Admit: 2019-06-12 | Discharge: 2019-06-12 | Disposition: A | Discharge status: Home / Self Care | Payer: Medicare Other | Source: Ambulatory Visit | Attending: Family Medicine | Admitting: Family Medicine

## 2019-06-12 ENCOUNTER — Other Ambulatory Visit: Payer: Self-pay

## 2019-06-12 ENCOUNTER — Other Ambulatory Visit: Payer: Self-pay | Admitting: Family Medicine

## 2019-06-12 ENCOUNTER — Ambulatory Visit (INDEPENDENT_AMBULATORY_CARE_PROVIDER_SITE_OTHER): Payer: Medicare Other | Admitting: Family Medicine

## 2019-06-12 ENCOUNTER — Encounter: Payer: Self-pay | Admitting: Family Medicine

## 2019-06-12 VITALS — BP 150/70 | HR 72 | Ht 70.0 in | Wt 210.0 lb

## 2019-06-12 DIAGNOSIS — I2510 Atherosclerotic heart disease of native coronary artery without angina pectoris: Secondary | ICD-10-CM

## 2019-06-12 DIAGNOSIS — M25551 Pain in right hip: Secondary | ICD-10-CM

## 2019-06-12 DIAGNOSIS — I350 Nonrheumatic aortic (valve) stenosis: Secondary | ICD-10-CM | POA: Diagnosis not present

## 2019-06-12 DIAGNOSIS — M1611 Unilateral primary osteoarthritis, right hip: Secondary | ICD-10-CM | POA: Diagnosis not present

## 2019-06-12 LAB — ECHOCARDIOGRAM COMPLETE
Height: 70 in
Weight: 3360 oz

## 2019-06-12 MED ORDER — KETOROLAC TROMETHAMINE 60 MG/2ML IM SOLN
60.0000 mg | Freq: Once | INTRAMUSCULAR | Status: AC
  Administered 2019-06-12: 11:00:00 60 mg via INTRAMUSCULAR

## 2019-06-12 MED ORDER — METHYLPREDNISOLONE ACETATE 80 MG/ML IJ SUSP
80.0000 mg | Freq: Once | INTRAMUSCULAR | Status: AC
  Administered 2019-06-12: 80 mg via INTRAMUSCULAR

## 2019-06-12 NOTE — Progress Notes (Signed)
[name] [place],  [address] Phone: [phone] Subjective:   I Patrick Franco am serving as a Education administrator for Dr. Hulan Saas.    CC: Right hip pain  JKD:[id number]   05/22/2019 Patient given injection today.  Tolerated the procedure well.  Discussed icing regimen and home exercise.  Discussed which activities to avoid.  Patient is to increase activity slowly.  I do believe that a decent amount of patient's knee pain is secondary to the lumbar spinal stenosis and a possible epidural would be beneficial again.  Patient's last MRI is within 3 years.  Patient has had some weakness of foot drop previously but seems to be stable at the moment.  06/12/2019 Patrick Franco is a 85 y.o. male coming in with complaint of right hip pain. States his hip is not doing well. Issues with walking.  Patient states that his physical therapist himself feels like it is worsening.  Patient states even walking 5-6 steps is almost unbearable.  Pain seems to be posteriorly but states that he feels like it is deep.  Denies any groin pain.  Feels like it can be weak.  Going up the stairs cannot lead with that leg with going up stairs at the moment. Patient does not believe that this is back.  Has responded somewhat to epidurals in the past but patient wants to avoid that.     Past Medical History:  Diagnosis Date   Aortic stenosis    BPH (benign prostatic hyperplasia)    Chronic low back pain    Depression    Diverticulitis    Elevated PSA    Foot drop, right    Foot drop, right    Gait disorder 02/03/2015   Hip pain    History of colonoscopy    HOH (hard of hearing)    hearing aid   Hypertension    Lumbar radiculopathy    Mitral valve prolapse    Onychomycosis    Peptic ulcer disease    Sepsis ([place]) 2005   e. coli-after prostate bx   Sleep apnea    Torn ACL    Past Surgical History:  Procedure Laterality Date   CATARACT EXTRACTION  Bilateral    ESOPHAGOGASTRODUODENOSCOPY N/A 08/19/2015   Procedure: ESOPHAGOGASTRODUODENOSCOPY (EGD);  Surgeon: Laurence Spates, MD;  Location: [facility] ENDOSCOPY;  Service: Endoscopy;  Laterality: N/A;   right knee replacement Right 1996   SHOULDER SURGERY     TONSILLECTOMY     Social History   Socioeconomic History   Marital status: Married    Spouse name: Not on file   Number of children: 2   Years of education: Not on file   Highest education level: Not on file  Occupational History   Occupation: Retired Social research officer, government  Social Needs   Emergency planning/management officer strain: Not on file   Food insecurity    Worry: Not on file    Inability: Not on file   Transportation needs    Medical: Not on file    Non-medical: Not on file  Tobacco Use   Smoking status: Former Smoker    Packs/day: 0.50    Types: Cigarettes    Start date: 1960    Quit date: 1961    Years since quitting: 59.6   Smokeless tobacco: Never Used   Tobacco comment: 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   Patient drinks 1 cup of coffee per day.   No Known Allergies Family History  Problem Relation Age of Onset   Pneumonia Mother 44   Cancer - Lung Father 2   Multiple myeloma Brother 25     Current Outpatient Medications (Cardiovascular):    atorvastatin (LIPITOR) 10 MG tablet,    indapamide (LOZOL)  2.5 MG tablet, Take 2.5 mg by mouth daily.   verapamil (CALAN-SR) 240 MG CR tablet, Take 240 mg by mouth daily.  Current Outpatient Medications (Respiratory):    Tiotropium Bromide-Olodaterol (STIOLTO RESPIMAT) 2.5-2.5 MCG/ACT AERS, Inhale 2 puffs into the lungs daily.  Current Outpatient Medications (Analgesics):    allopurinol (ZYLOPRIM) 100 MG tablet, TAKE 1 TABLET ONCE DAILY.   aspirin EC 81 MG tablet, Take 1 tablet (81 mg total) by mouth daily.   colchicine 0.6 MG tablet, Take 1 tablet (0.6 mg total) by mouth 2 (two) times daily as needed.   Current Outpatient Medications (Other):    dutasteride (AVODART) 0.5 MG capsule, Take 0.5 mg by mouth daily.   esomeprazole (NEXIUM) 40 MG capsule, Take 1 capsule (40 mg total) by mouth 2 (two) times daily before a meal.   gabapentin (NEURONTIN) 100 MG capsule, Take 2 capsules (200 mg total) by mouth at bedtime.   mirtazapine (REMERON) 30 MG tablet, TAKE ONE TABLET AT BEDTIME.   Multiple Vitamin (MULTIVITAMIN) tablet, Take 1 tablet by mouth daily.   Omega-3 Fatty Acids (FISH OIL PO), Take 1,000 mg by mouth daily.   Probiotic Product (PROBIOTIC DAILY PO), Take 1 tablet by mouth daily.   Respiratory Therapy Supplies (FLUTTER) DEVI, Use the flutter valve 10 times when doing it daily.   tamsulosin (FLOMAX) 0.4 MG CAPS capsule, Take 0.4 mg by mouth daily.   Turmeric 500 MG CAPS, Take 500 mg by mouth daily.   Vitamin D, Ergocalciferol, (DRISDOL) 1.25 MG (50000 UT) CAPS capsule, TAKE 1 CAPSULE WEEKLY.    Past medical history, social, surgical and family history all reviewed in electronic medical record.  No pertanent information unless stated regarding to the chief complaint.   Review of Systems:  No headache, visual changes, nausea, vomiting, diarrhea, constipation, dizziness, abdominal pain, skin rash, fevers, chills, night sweats, weight loss, swollen lymph nodes, body aches, joint swelling, , chest pain, shortness of breath, mood  changes.  Positive muscle aches  Objective  Blood pressure (!) 150/70, pulse 72, height 5' 10"  (1.778 m), weight 210 lb (95.3 kg), SpO2 95 %.   General: No apparent distress alert and oriented x3 mood and affect normal, dressed appropriately.  HEENT: Pupils equal, extraocular movements intact  Respiratory: Patient's speak in full sentences and does not appear short of breath  Cardiovascular: Trace lower extremity edema, non tender, no erythema  Skin: Warm dry intact with no signs of infection or  rash on extremities or on axial skeleton.  Abdomen: Soft nontender  Neuro: Cranial nerves II through XII are intact, neurovascularly intact in all extremities with 2+ DTRs and 2+ pulses.  Lymph: No lymphadenopathy of posterior or anterior cervical chain or axillae bilaterally.  Gait severely antalgic Patient is left hip shows that with patient's antalgic gait the patient does have a positive Trendelenburg which is new.  Patient does have some tenderness in the gluteal area.  Minimal tenderness over the greater trochanteric area where the pain seems to be worse previously.  Negative straight leg test but severe tightness of the hamstring. Patient has minimal internal range of motion of the hip but no pain.  More pain with Corky Sox test.  Back exam does show the patient is tender to palpation the paraspinal musculature lumbar spine from L3-S1 all right-sided.  Patient does have some weakness with hip abductor as well as hip flexion with 4 out of 5 compared to contralateral side    Impression and Recommendations:     This case required medical decision making of moderate complexity. The above documentation has been reviewed and is accurate and complete Lyndal Pulley, DO       Note: This dictation was prepared with Dragon dictation along with smaller phrase technology. Any transcriptional errors that result from this process are unintentional.

## 2019-06-12 NOTE — Patient Instructions (Signed)
Xray downstairs MRI at [facility]: [phone] Give [place] a call to schedule once you have MRI scheduled [phone]

## 2019-06-12 NOTE — Assessment & Plan Note (Signed)
Right hip pain.  Patient states that it is severe.  Also concern for more of the radicular symptoms patient does have significant decreased range of motion of the hip that seems to be new.  Continues to have some mild foot drop but I think is also compensation.  Patient will have x-rays but I do think an MRI is necessary for a stress reaction of the back of the acetabulum secondary to the positive grind.  Do not feel labral pathology would be likely.  Patient then will can follow-up after the imaging and discuss further treatment options.  Spent  25 minutes with patient face-to-face and had greater than 50% of counseling including as described above in assessment and plan.

## 2019-06-12 NOTE — Telephone Encounter (Signed)
Pt scheduled today

## 2019-06-13 ENCOUNTER — Telehealth: Payer: Self-pay | Admitting: *Deleted

## 2019-06-13 DIAGNOSIS — M48062 Spinal stenosis, lumbar region with neurogenic claudication: Secondary | ICD-10-CM | POA: Diagnosis not present

## 2019-06-13 DIAGNOSIS — M545 Low back pain: Secondary | ICD-10-CM | POA: Diagnosis not present

## 2019-06-13 DIAGNOSIS — M1712 Unilateral primary osteoarthritis, left knee: Secondary | ICD-10-CM | POA: Diagnosis not present

## 2019-06-13 DIAGNOSIS — I350 Nonrheumatic aortic (valve) stenosis: Secondary | ICD-10-CM

## 2019-06-13 DIAGNOSIS — I340 Nonrheumatic mitral (valve) insufficiency: Secondary | ICD-10-CM

## 2019-06-13 NOTE — Telephone Encounter (Signed)
Pt.notified

## 2019-06-13 NOTE — Telephone Encounter (Signed)
-----   Message from Burnell Blanks, MD sent at 06/13/2019 10:42 AM EDT ----- Echo is unchanged. Aortic stenosis is mild to moderate, unchanged. Normal LV systolic function. Will repeat echo in one year. Thanks, Patrick Franco

## 2019-06-17 ENCOUNTER — Other Ambulatory Visit: Payer: Self-pay

## 2019-06-17 ENCOUNTER — Telehealth: Payer: Self-pay

## 2019-06-17 ENCOUNTER — Telehealth: Payer: Self-pay | Admitting: Physical Therapy

## 2019-06-17 DIAGNOSIS — M25559 Pain in unspecified hip: Secondary | ICD-10-CM

## 2019-06-17 NOTE — Telephone Encounter (Signed)
Pt called in reference to phone call with Kana from 06/14/19 regarding XR results.  Pt states that he would like to discuss his prognosis based on these results.  Please return his call.

## 2019-06-17 NOTE — Telephone Encounter (Signed)
Called to discuss with patient about the next steps for his hip. Left message to call back.

## 2019-06-17 NOTE — Telephone Encounter (Signed)
Returned patient call and scheduled him for doxy tomorrow to talk to Dr. Tamala Julian about his xrays.

## 2019-06-18 ENCOUNTER — Encounter: Payer: Self-pay | Admitting: Family Medicine

## 2019-06-18 ENCOUNTER — Ambulatory Visit (INDEPENDENT_AMBULATORY_CARE_PROVIDER_SITE_OTHER): Payer: Medicare Other | Admitting: Family Medicine

## 2019-06-18 DIAGNOSIS — M1611 Unilateral primary osteoarthritis, right hip: Secondary | ICD-10-CM | POA: Insufficient documentation

## 2019-06-18 DIAGNOSIS — I2510 Atherosclerotic heart disease of native coronary artery without angina pectoris: Secondary | ICD-10-CM | POA: Diagnosis not present

## 2019-06-18 DIAGNOSIS — M48062 Spinal stenosis, lumbar region with neurogenic claudication: Secondary | ICD-10-CM | POA: Diagnosis not present

## 2019-06-18 DIAGNOSIS — M545 Low back pain: Secondary | ICD-10-CM | POA: Diagnosis not present

## 2019-06-18 DIAGNOSIS — M25551 Pain in right hip: Secondary | ICD-10-CM | POA: Diagnosis not present

## 2019-06-18 DIAGNOSIS — M1712 Unilateral primary osteoarthritis, left knee: Secondary | ICD-10-CM | POA: Diagnosis not present

## 2019-06-18 NOTE — Progress Notes (Signed)
Virtual Visit via Video Note  I connected with Patrick Franco on 06/18/19 at  2:15 PM EDT by a video enabled telemedicine application and verified that I am speaking with the correct person using two identifiers.  Location: Patient: At home Provider: In office   I discussed the limitations of evaluation and management by telemedicine and the availability of in person appointments. The patient expressed understanding and agreed to proceed.  History of Present Illness: 85 year old gentleman with history of mitral valve prolapse and neurogenic claudication of the lumbar spine came in previously for right hip pain and weakness.  Found to have severe decrease in range of motion but due to the pain and weakness was concern for stress reaction or stress fracture.  X-rays ordered below and were found to have severe osteoarthritic changes.    Observations/Objective: Video was difficult and we started with audio otherwise.   Assessment and Plan: Right hip arthritis bone-on-bone will refer to orthopedic surgery.  We addressed all patient's questions prognosis.  I do feel the patient should do well with a conservative approach.  Patient will follow-up with me as needed   Follow Up Instructions: As needed referring to orthopedic surgery.    I discussed the assessment and treatment plan with the patient. The patient was provided an opportunity to ask questions and all were answered. The patient agreed with the plan and demonstrated an understanding of the instructions.   The patient was advised to call back or seek an in-person evaluation if the symptoms worsen or if the condition fails to improve as anticipated.  I provided 21 minutes of face-to-face time during this encounter.   Lyndal Pulley, DO

## 2019-06-18 NOTE — Assessment & Plan Note (Signed)
Severe referred to orthopedic surgery

## 2019-06-20 DIAGNOSIS — M1712 Unilateral primary osteoarthritis, left knee: Secondary | ICD-10-CM | POA: Diagnosis not present

## 2019-06-20 DIAGNOSIS — M48062 Spinal stenosis, lumbar region with neurogenic claudication: Secondary | ICD-10-CM | POA: Diagnosis not present

## 2019-06-20 DIAGNOSIS — M545 Low back pain: Secondary | ICD-10-CM | POA: Diagnosis not present

## 2019-06-26 ENCOUNTER — Encounter: Payer: Self-pay | Admitting: Orthopaedic Surgery

## 2019-06-26 ENCOUNTER — Ambulatory Visit (INDEPENDENT_AMBULATORY_CARE_PROVIDER_SITE_OTHER): Payer: Medicare Other | Admitting: Orthopaedic Surgery

## 2019-06-26 DIAGNOSIS — M1611 Unilateral primary osteoarthritis, right hip: Secondary | ICD-10-CM | POA: Diagnosis not present

## 2019-06-26 DIAGNOSIS — M25551 Pain in right hip: Secondary | ICD-10-CM | POA: Diagnosis not present

## 2019-06-26 DIAGNOSIS — I2510 Atherosclerotic heart disease of native coronary artery without angina pectoris: Secondary | ICD-10-CM

## 2019-06-26 NOTE — Progress Notes (Signed)
Office Visit Note   Patient: Patrick Franco           Date of Birth: 1932-03-23           MRN: [id number] Visit Date: 06/26/2019              Requested by: Lavone Orn, MD [facility] [address] [place],  [place] [address] PCP: Lavone Orn, MD   Assessment & Plan: Visit Diagnoses:  1. Pain of right hip joint   2. Unilateral primary osteoarthritis, right hip     Plan: He does have significant end-stage arthritis of his right hip.  I am recommending an intra-articular steroid injection under ultrasound to further assess his hip from a diagnostic and therapeutic standpoint.  He understands this recommendation as well.  I will send him to Dr. Junius Roads as a consultation for an ultrasound-guided right hip steroid injection.  I will then see him back myself in about 2 weeks.  All question concerns were answered and addressed.  I did give him a handout about hip replacement surgery as well.  Follow-Up Instructions: Return in about 2 years (around 06/25/2021).   Orders:  No orders of the defined types were placed in this encounter.  No orders of the defined types were placed in this encounter.     Procedures: No procedures performed   Clinical Data: No additional findings.   Subjective: Chief Complaint  Patient presents with  . Right Hip - Pain  The patient comes in today to discuss his right hip.  He has debilitating arthritis in the right hip.  He lives at [place] with his wife.  He does get in a pool to work on exercise walking in the pool.  He has a history of a previous right total knee arthroplasty.  His right hip is been bothering him for several months now.  He has had at least 2 steroid injections but on the trochanteric area and the bursa of his right hip but not in the hip joint itself.  He is a patient of cardiology as well and I have reviewed their notes.  He is active.  He does not walk with a cane.  He is 85 years old.  He does report that his hip pain is detriment  affecting his mobility, his quality of life, and his actives of daily living.  He is worked on activity modification and therapy on his hip.  HPI  Review of Systems He currently denies any headache, chest pain, shortness of breath, fever, chills, nausea, vomiting  Objective: Vital Signs: There were no vitals taken for this visit.  Physical Exam He is alert and orient x3 and in no acute distress Ortho Exam Examination of his left hip is normal examination of his right hip shows pain on extremes of rotation but no blocks to rotation but it definitely hurts in the groin area. Specialty Comments:  No specialty comments available.  Imaging: No results found. X-rays independently reviewed of the right hip show severe end-stage arthritis with large particular osteophytes all around the femoral head neck.  There is superior lateral joint space narrowing and osteophytes off the acetabulum.  There are sclerotic changes as well.  There is irregularity of the femoral head.  PMFS History: Patient Active Problem List   Diagnosis Date Noted  . Unilateral primary osteoarthritis, right hip 06/26/2019  . Arthritis of right hip 06/18/2019  . Right hip pain 06/12/2019  . Greater trochanteric bursitis of right hip 05/22/2019  .  Depression 02/21/2017  . Degenerative arthritis of left knee 09/16/2016  . Left knee pain 06/02/2016  . Gout 04/12/2016  . Obesity 02/09/2016  . Spinal stenosis, lumbar region, with neurogenic claudication 02/09/2016  . Muscle fatigue 09/21/2015  . Gastric ulcer with hemorrhage 08/19/2015  . Hypertension   . Sleep apnea   . Chronic low back pain   . BPH (benign prostatic hyperplasia)   . Gait disorder 02/03/2015  . Foot drop, right 02/03/2015   Past Medical History:  Diagnosis Date  . Aortic stenosis   . BPH (benign prostatic hyperplasia)   . Chronic low back pain   . Depression   . Diverticulitis   . Elevated PSA   . Foot drop, right   . Foot drop, right   .  Gait disorder 02/03/2015  . Hip pain   . History of colonoscopy   . HOH (hard of hearing)    hearing aid  . Hypertension   . Lumbar radiculopathy   . Mitral valve prolapse   . Onychomycosis   . Peptic ulcer disease   . Sepsis ([place]) 2005   e. coli-after prostate bx  . Sleep apnea   . Torn ACL     Family History  Problem Relation Age of Onset  . Pneumonia Mother 47  . Cancer - Lung Father 90  . Multiple myeloma Brother 38    Past Surgical History:  Procedure Laterality Date  . CATARACT EXTRACTION Bilateral   . ESOPHAGOGASTRODUODENOSCOPY N/A 08/19/2015   Procedure: ESOPHAGOGASTRODUODENOSCOPY (EGD);  Surgeon: Laurence Spates, MD;  Location: [facility] ENDOSCOPY;  Service: Endoscopy;  Laterality: N/A;  . right knee replacement Right 1996  . SHOULDER SURGERY    . TONSILLECTOMY     Social History   Occupational History  . Occupation: Retired Social research officer, government  Tobacco Use  . Smoking status: Former Smoker    Packs/day: 0.50    Types: Cigarettes    Start date: 1960    Quit date: 1961    Years since quitting: 59.6  . Smokeless tobacco: Never Used  . 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 Alcohol use: Not on file  . Drug use: No  . Sexual activity: Yes

## 2019-06-26 NOTE — Progress Notes (Signed)
Subjective: Patient is here for ultrasound-guided intra-articular right hip injection.   Chronic posterior pain, has had trochanter injections with minimal improvement.  He also has lumbar stenosis but has not had epidurals in a while.  Denies any groin pain.  X-rays show quite a bit of arthritis.  Objective: He has limited internal rotation motion with no significant pain on passive movement.  Procedure: Ultrasound-guided right hip injection: After sterile prep with Betadine, injected 8 cc 1% lidocaine without epinephrine and 40 mg methylprednisolone using a 22-gauge spinal needle, passing the needle through the iliofemoral ligament into the femoral head/neck junction.  Injectate was seen filling the joint capsule.  He was able to walk with much less pain, he felt like his posterior hip "pressure" was definitely improved.  He will follow-up in 2 weeks with Dr. Ninfa Linden.

## 2019-07-06 ENCOUNTER — Other Ambulatory Visit: Payer: Medicare Other

## 2019-07-08 ENCOUNTER — Other Ambulatory Visit: Payer: Self-pay | Admitting: Family Medicine

## 2019-07-10 ENCOUNTER — Encounter: Payer: Self-pay | Admitting: Orthopaedic Surgery

## 2019-07-10 ENCOUNTER — Ambulatory Visit (INDEPENDENT_AMBULATORY_CARE_PROVIDER_SITE_OTHER): Payer: Medicare Other | Admitting: Orthopaedic Surgery

## 2019-07-10 DIAGNOSIS — I2510 Atherosclerotic heart disease of native coronary artery without angina pectoris: Secondary | ICD-10-CM

## 2019-07-10 DIAGNOSIS — M1611 Unilateral primary osteoarthritis, right hip: Secondary | ICD-10-CM | POA: Diagnosis not present

## 2019-07-10 NOTE — Progress Notes (Signed)
Office Visit Note   Patient: Patrick Franco           Date of Birth: 02-05-1932           MRN: [id number] Visit Date: 07/10/2019              Requested by: Patrick Orn, MD [facility] [address] [place],  [place] [address] PCP: Patrick Orn, MD   Assessment & Plan: Visit Diagnoses:  1. Unilateral primary osteoarthritis, right hip     Plan: Due to patient's end-stage arthritis conservative measures which gave short-term relief and intra-articular injection of the right hip recommend right total hip arthroplasty.  Patient would like to proceed with a right total hip arthroplasty in the near future.  He states that he sees his cardiologist yearly in about 6 weeks ago had a echo cardiogram that was unchanged from prior echo.  He has had no chest pain.  He will use a cane to offload the hip told on surgery using a cane in left hand.  Also given a handicap placard permit.  Risk benefits of surgery discussed with patient and his wife whose able to join the conversation using his cell phone today.  Risk include but are not limited to infection, nerve vessel injury, worsening pain, prolonged pain, DVT/PE and blood loss.  Handout on total hip surgery was given to the patient.  The show him hip arthroplasty components today.  Questions encouraged and answered at length by Dr. Ninfa Franco and myself.  He will follow-up with [place] 2 weeks postop.  Follow-Up Instructions: Return 2 weeks post-op.   Orders:  No orders of the defined types were placed in this encounter.  No orders of the defined types were placed in this encounter.     Procedures: No procedures performed   Clinical Data: No additional findings.   Subjective: Chief Complaint  Patient presents with  . Right Hip - Follow-up    HPI Patrick Franco returns today for follow-up of his right hip status post intra-articular injection by Dr. [name].  This intra-articular injection of the right hip gave him some relief for 5 to 7 days he  states for 5 days he had limited relief.  He does have spinal stenosis.  Reports that the epidural injections in regards to his back were not helping when evaluated last given year ago.  Pain is worse with standing.  Patient states however if he is in the water and does water walking that he really has no pain.  Unable to walk for exercise due to the lateral hip pain.  Again radiographs of his right hip shows severe osteoarthritis. Review of Systems Denies any chest pain fevers or chills.  Objective: Vital Signs: There were no vitals taken for this visit.  Physical Exam Constitutional:      Appearance: He is not ill-appearing or diaphoretic.  Pulmonary:     Effort: Pulmonary effort is normal.  Neurological:     Mental Status: He is alert and oriented to person, place, and time.  Psychiatric:        Mood and Affect: Mood normal.     Ortho Exam Right hip he has good range of motion of both hips.  Extremes of internal and external rotation causes pain in the right hip. specialty Comments:  No specialty comments available.  Imaging: No results found.   PMFS History: Patient Active Problem List   Diagnosis Date Noted  . Unilateral primary osteoarthritis, right hip 06/26/2019  .  Arthritis of right hip 06/18/2019  . Right hip pain 06/12/2019  . Greater trochanteric bursitis of right hip 05/22/2019  . Depression 02/21/2017  . Degenerative arthritis of left knee 09/16/2016  . Left knee pain 06/02/2016  . Gout 04/12/2016  . Obesity 02/09/2016  . Spinal stenosis, lumbar region, with neurogenic claudication 02/09/2016  . Muscle fatigue 09/21/2015  . Gastric ulcer with hemorrhage 08/19/2015  . Hypertension   . Sleep apnea   . Chronic low back pain   . BPH (benign prostatic hyperplasia)   . Gait disorder 02/03/2015  . Foot drop, right 02/03/2015   Past Medical History:  Diagnosis Date  . Aortic stenosis   . BPH (benign prostatic hyperplasia)   . Chronic low back pain   .  Depression   . Diverticulitis   . Elevated PSA   . Foot drop, right   . Foot drop, right   . Gait disorder 02/03/2015  . Hip pain   . History of colonoscopy   . HOH (hard of hearing)    hearing aid  . Hypertension   . Lumbar radiculopathy   . Mitral valve prolapse   . Onychomycosis   . Peptic ulcer disease   . Sepsis ([place]) 2005   e. coli-after prostate bx  . Sleep apnea   . Torn ACL     Family History  Problem Relation Age of Onset  . Pneumonia Mother 28  . Cancer - Lung Father 60  . Multiple myeloma Brother 57    Past Surgical History:  Procedure Laterality Date  . CATARACT EXTRACTION Bilateral   . ESOPHAGOGASTRODUODENOSCOPY N/A 08/19/2015   Procedure: ESOPHAGOGASTRODUODENOSCOPY (EGD);  Surgeon: Laurence Spates, MD;  [facility]: [facility] END[facility]COPY;  Service: Endoscopy;  Laterality: N/A;  . right knee replacement Right 1996  . SHOULDER SURGERY    . TONSILLECTOMY     Social History   Occupational History  . Occupation: Retired Social research officer, government  Tobacco Use  . Smoking status: Former Smoker    Packs/day: 0.50    Types: Cigarettes    Start date: 1960    Quit date: 1961    Years since quitting: 59.6  . Smokeless tobacco: Never Used  . 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 Alcohol use: Not on file  . Drug use: No  . Sexual activity: Yes

## 2019-07-31 ENCOUNTER — Other Ambulatory Visit: Payer: Self-pay

## 2019-08-01 ENCOUNTER — Other Ambulatory Visit: Payer: Self-pay | Admitting: Physician Assistant

## 2019-08-05 NOTE — Progress Notes (Addendum)
[place], [place] [address] Phone: [phone] Fax: [phone]      Your procedure is scheduled on Tuesday, September 29th.  Report to [facility]tr[facility] "A" at 12:00 P.M., and check in at the Admitting office.  Call this number if you have problems the morning of surgery:  [phone]  Call [phone] if you have any questions prior to your surgery date Monday-Friday 8am-4pm    Remember:  Do not eat after midnight the night before your surgery  You may drink clear liquids until 11:00 the morning of your surgery.   Clear liquids allowed are: Water, Non-Citrus Juices (without pulp), Carbonated Beverages, Clear Tea, Black Coffee Only, and Gatorade.   Enhanced Recovery after Surgery for Orthopedics Enhanced Recovery after Surgery is a protocol used to improve the stress on your body and your recovery after surgery.  Patient Instructions  . The night before surgery:  o No food after midnight. ONLY clear liquids after midnight  .  [name] The day of surgery (if you do NOT have diabetes):  o Drink ONE (1) Pre-Surgery Clear Ensure as directed.   o This drink was given to you during your hospital  pre-op appointment visit. o The pre-op nurse will instruct you on the time to drink the  Pre-Surgery Ensure depending on your surgery time. o Finish the drink at the designated time by the pre-op nurse.  o Nothing else to drink after completing the  Pre-Surgery Clear Ensure.          If you have questions, please contact your surgeon's office.     Take these medicines the morning of surgery with A SIP OF WATER  allopurinol (ZYLOPRIM)  colchicine  dutasteride (AVODART)  esomeprazole (NEXIUM)  tamsulosin (FLOMAX)  Inhaler  7 days prior to surgery STOP taking any Aspirin (unless otherwise instructed by your surgeon), Aleve, Naproxen, Ibuprofen, Motrin, Advil, Goody's, BC's, all herbal  medications, fish oil, and all vitamins.    The Morning of Surgery  Do not wear jewelry.  Do not wear lotions, powders, or colognes, or deodorant  Men may shave face and neck.  Do not bring valuables to the hospital.  [facility] is not responsible for any belongings or valuables.  If you are a smoker, DO NOT Smoke 24 hours prior to surgery IF you wear a CPAP at night please bring your mask, tubing, and machine the morning of surgery   Remember that you must have someone to transport you home after your surgery, and remain with you for 24 hours if you are discharged the same day.   Contacts, glasses, hearing aids, dentures or bridgework may not be worn into surgery.    Leave your suitcase in the car.  After surgery it may be brought to your room.  For patients admitted to the hospital, discharge time will be determined by your treatment team.  Patients discharged the day of surgery will not be allowed to drive home.    Special instructions:   [facility]- Preparing For Surgery  Before surgery, you can play an important role. Because skin is not sterile, your skin needs to be as free of germs as possible. You can reduce the number of germs on your skin by washing with CHG (chlorahexidine gluconate) Soap before surgery.  CHG is an antiseptic cleaner which kills germs and bonds with the skin to continue killing germs even after washing.  Oral Hygiene is also important to reduce your risk of infection.  Remember - BRUSH YOUR TEETH THE MORNING OF SURGERY WITH YOUR REGULAR TOOTHPASTE  Please do not use if you have an allergy to CHG or antibacterial soaps. If your skin becomes reddened/irritated stop using the CHG.  Do not shave (including legs and underarms) for at least 48 hours prior to first CHG shower. It is OK to shave your face.  Please follow these instructions carefully.   1. Shower the NIGHT BEFORE SURGERY and the MORNING OF SURGERY with CHG Soap.   2. If you chose to  wash your hair, wash your hair first as usual with your normal shampoo.  3. After you shampoo, rinse your hair and body thoroughly to remove the shampoo.  4. Use CHG as you would any other liquid soap. You can apply CHG directly to the skin and wash gently with a scrungie or a clean washcloth.   5. Apply the CHG Soap to your body ONLY FROM THE NECK DOWN.  Do not use on open wounds or open sores. Avoid contact with your eyes, ears, mouth and genitals (private parts). Wash Face and genitals (private parts)  with your normal soap.   6. Wash thoroughly, paying special attention to the area where your surgery will be performed.  7. Thoroughly rinse your body with warm water from the neck down.  8. DO NOT shower/wash with your normal soap after using and rinsing off the CHG Soap.  9. Pat yourself dry with a CLEAN TOWEL.  10. Wear CLEAN PAJAMAS to bed the night before surgery, wear comfortable clothes the morning of surgery  11. Place CLEAN SHEETS on your bed the night of your first shower and DO NOT SLEEP WITH PETS.    Day of Surgery:  Do not apply any deodorants/lotions. Please shower the morning of surgery with the CHG soap  Please wear clean clothes to the hospital/surgery center.   Remember to brush your teeth WITH YOUR REGULAR TOOTHPASTE.   Please read over the following fact sheets that you were given.

## 2019-08-06 ENCOUNTER — Encounter (HOSPITAL_COMMUNITY): Payer: Self-pay

## 2019-08-06 ENCOUNTER — Other Ambulatory Visit: Payer: Self-pay

## 2019-08-06 ENCOUNTER — Encounter (HOSPITAL_COMMUNITY)
Admission: RE | Admit: 2019-08-06 | Discharge: 2019-08-06 | Disposition: A | Discharge status: Home / Self Care | Payer: Medicare Other | Source: Ambulatory Visit | Attending: Orthopaedic Surgery | Admitting: Orthopaedic Surgery

## 2019-08-06 DIAGNOSIS — G4733 Obstructive sleep apnea (adult) (pediatric): Secondary | ICD-10-CM | POA: Insufficient documentation

## 2019-08-06 DIAGNOSIS — Z7982 Long term (current) use of aspirin: Secondary | ICD-10-CM | POA: Diagnosis not present

## 2019-08-06 DIAGNOSIS — I341 Nonrheumatic mitral (valve) prolapse: Secondary | ICD-10-CM | POA: Diagnosis not present

## 2019-08-06 DIAGNOSIS — F329 Major depressive disorder, single episode, unspecified: Secondary | ICD-10-CM | POA: Diagnosis not present

## 2019-08-06 DIAGNOSIS — M1611 Unilateral primary osteoarthritis, right hip: Secondary | ICD-10-CM | POA: Diagnosis not present

## 2019-08-06 DIAGNOSIS — G8929 Other chronic pain: Secondary | ICD-10-CM | POA: Insufficient documentation

## 2019-08-06 DIAGNOSIS — Z79899 Other long term (current) drug therapy: Secondary | ICD-10-CM | POA: Diagnosis not present

## 2019-08-06 DIAGNOSIS — I10 Essential (primary) hypertension: Secondary | ICD-10-CM | POA: Insufficient documentation

## 2019-08-06 DIAGNOSIS — N400 Enlarged prostate without lower urinary tract symptoms: Secondary | ICD-10-CM | POA: Diagnosis not present

## 2019-08-06 DIAGNOSIS — I2510 Atherosclerotic heart disease of native coronary artery without angina pectoris: Secondary | ICD-10-CM | POA: Insufficient documentation

## 2019-08-06 DIAGNOSIS — I350 Nonrheumatic aortic (valve) stenosis: Secondary | ICD-10-CM | POA: Diagnosis not present

## 2019-08-06 DIAGNOSIS — Z01812 Encounter for preprocedural laboratory examination: Secondary | ICD-10-CM | POA: Diagnosis not present

## 2019-08-06 DIAGNOSIS — Z87891 Personal history of nicotine dependence: Secondary | ICD-10-CM | POA: Diagnosis not present

## 2019-08-06 HISTORY — DX: Unspecified osteoarthritis, unspecified site: M19.90

## 2019-08-06 HISTORY — DX: Other specified diseases of upper respiratory tract: J39.8

## 2019-08-06 LAB — BASIC METABOLIC PANEL
Anion gap: 12 (ref 5–15)
BUN: 18 mg/dL (ref 8–23)
CO2: 27 mmol/L (ref 22–32)
Calcium: 9.3 mg/dL (ref 8.9–10.3)
Chloride: 100 mmol/L (ref 98–111)
Creatinine, Ser: 0.85 mg/dL (ref 0.61–1.24)
GFR calc Af Amer: >60 mL/min (ref >60)
GFR calc non Af Amer: >60 mL/min (ref >60)
Glucose, Bld: 112 mg/dL — ABNORMAL HIGH (ref 70–99)
Potassium: 3.8 mmol/L (ref 3.5–5.1)
Sodium: 139 mmol/L (ref 135–145)

## 2019-08-06 LAB — CBC
HCT: 44 % (ref 39.0–52.0)
Hemoglobin: 15 g/dL (ref 13.0–17.0)
MCH: 33.6 pg (ref 26.0–34.0)
MCHC: 34.1 g/dL (ref 30.0–36.0)
MCV: 98.4 fL (ref 80.0–100.0)
Platelets: 228 10*3/uL (ref 150–400)
RBC: 4.47 MIL/uL (ref 4.22–5.81)
RDW: 13.2 % (ref 11.5–15.5)
WBC: 6.2 10*3/uL (ref 4.0–10.5)
nRBC: 0 % (ref 0.0–0.2)

## 2019-08-06 LAB — SURGICAL PCR SCREEN
MRSA, PCR: NEGATIVE
Staphylococcus aureus: NEGATIVE

## 2019-08-06 NOTE — Progress Notes (Signed)
PCP - Dr. Lavone Orn Cardiologist - Dr. Angelena Form  PPM/ICD - N/A Device Orders - N/A Rep Notified  Chest x-ray - N/A EKG - 06/07/19 Stress Test - 04/07/17 ECHO - 06/12/19 Cardiac Cath - denies  Sleep Study - OSA+ CPAP - Uses nightly, instructed to bring CPAP on DOS.   Blood Thinner Instructions: N/A Aspirin Instructions:N/A  ERAS Protcol -Yes PRE-SURGERY Ensure -1 drink given   COVID TEST- 08/09/19, pt educated on quarantining after testing.    Anesthesia review: Yes, hx of mitral valve prolapse.   Patient denies shortness of breath, fever, cough and chest pain at PAT appointment   Patient verbalized understanding of instructions that were given to them at the PAT appointment. Patient was also instructed that they will need to review over the PAT instructions again at home before surgery.    Coronavirus Screening  Have you experienced the following symptoms:  Cough yes/no: No Fever (>100.48F)  yes/no: No Runny nose yes/no: No Sore throat yes/no: No Difficulty breathing/shortness of breath  yes/no: No  Have you or a family member traveled in the last 14 days and where? yes/no: No   If the patient indicates "YES" to the above questions, their PAT will be rescheduled to limit the exposure to others and, the surgeon will be notified. THE PATIENT WILL NEED TO BE ASYMPTOMATIC FOR 14 DAYS.   If the patient is not experiencing any of these symptoms, the PAT nurse will instruct them to NOT bring anyone with them to their appointment since they may have these symptoms or traveled as well.   Please remind your patients and families that hospital visitation restrictions are in effect and the importance of the restrictions.

## 2019-08-07 ENCOUNTER — Encounter (HOSPITAL_COMMUNITY): Payer: Self-pay

## 2019-08-07 NOTE — Progress Notes (Addendum)
Anesthesia Chart Review:  Case: [id number] Date/Time: 08/13/19 1357   Procedure: RIGHT TOTAL HIP ARTHROPLASTY ANTERIOR APPROACH (Right Hip)   Anesthesia type: Choice   Pre-op diagnosis: End Stage Osteoarthritis Right Hip   Location: [facility] OR ROOM 05 / [facility] OR   Surgeon: Mcarthur Rossetti, MD      DISCUSSION: Patient is an 85 year old male scheduled for the above procedure.   History includes former smoker, HTN, MVP (moderate MVP 05/2019), aortic stenosis (mild 05/2019), PUD, OSA (uses CPAP), chronic low back pain, gait disorder, right foot drop, hard of hearing, left thyroid nodule (FNA: benign follicular nodule [id number]). He had findings of CAD and tracheobronchomalacia, small airways disease, and scattered bronchiectasis on 2019 chest CT. Non-ischemic stress test in 2018.  By notes, he appeared to be stable at his 06/06/19 cardiology and 12/14/18 pulmonology office visits.  BP elevated at PAT. Unfortunately not rechecked at PAT. Does not appear that BP has been quite so high in the recent past. I have left a voice message for patient to call and speak with anesthesia APP to see if he has a way of monitoring his BP or if he will need to schedule PCP follow-up before surgery. Also want to make sure that he feels like he is at his baseline from a cardiopulmonary standpoint. Discussed available information with anesthesiologist Nolon Nations, MD. Additional input pending phone call return.  COVID-19 test is scheduled for 08/09/19.  Addendum 08/08/19: Pt returned phone call. He stated that he does have a history of white coat HTN. He says he has a BP machine at home and will check periodically and if readings remain elevated he will contact his PCP. He says from a cardiopulmonary standpoint he is at baseline. He walks in the pool and does light exercise 3x/wk at his retirement community. He says that due to his hx of tracheobronchomalacia and small airways disease he does fatigue easily with exertion but  this is chronic, no recent changes. He reports he is more limited by his hip pain than by his breathing. Overall he feels well.   VS: BP (!) 169/110   Pulse 83   Temp 37 C   Resp 20   Ht 5\' 10"  (1.778 m)   Wt 96.9 kg   SpO2 94%   BMI 30.65 kg/m   BP Readings from Last 3 Encounters:  08/06/19 (!) 169/110  06/12/19 (!) 150/70  06/06/19 (!) 156/98    PROVIDERS: Lavone Orn, MD is PCP - [name], MD is cardiologist. Last visit 06/06/19. Echo ordered with stable findings. No chest pain. He is on verapamil for PACs. 12 month follow-up recommended. Brand Males, MD is pulmonologist. Last visit 12/14/18. Stable from a pulmonary standpoint at that time. Okay to use Stiolto 3x/week. Use IS and flutter vale and CPAP at night. Six month follow-up planned.    LABS: Labs reviewed: Acceptable for surgery. (all labs ordered are listed, but only abnormal results are displayed)  Labs Reviewed  BASIC METABOLIC PANEL - Abnormal; Notable for the following components:      Result Value   Glucose, Bld 112 (*)    All other components within normal limits  SURGICAL PCR SCREEN  CBC    OTHER: Walk test 12/08/17: Walking desat in office: did not desat 185 feet x 3 laps  Spirometry 10/18/17: Moderately severe restriction.   IMAGES: CT Chest high resolution 12/27/17: IMPRESSION: 1. Tracheobronchomalacia. 2. Moderate patchy air trapping in both lungs, indicative of small airways disease.  3. Scattered mild cylindrical and varicoid bronchiectasis in both lungs. Areas of postinfectious/postinflammatory scarring in the mid to lower lungs bilaterally. Otherwise no evidence of interstitial lung disease. 4. Borderline mild cardiomegaly. Dilated main pulmonary artery, suggesting pulmonary arterial hypertension. 5. Left main and 3 vessel coronary atherosclerosis. 6. Inferior left thyroid lobe 4.6 cm nodule, for which thyroid ultrasound correlation is warranted if not previously  performed. 7. Diffuse hepatic steatosis. 8. Stable right adrenal adenoma.   EKG: 06/06/19 ([facility]): Sinus rhythm with marked sinus arrhythmia.   CV: Echo 06/12/19: IMPRESSIONS  1. The left ventricle has hyperdynamic systolic function, with an ejection fraction of >65%. The cavity size was normal. There is moderately increased left ventricular wall thickness. Left ventricular diastolic Doppler parameters are indeterminate. No evidence of left ventricular regional wall motion abnormalities.  2. The right ventricle has normal systolic function. The cavity was normal. There is no increase in right ventricular wall thickness.  3. Left atrial size was moderately dilated.  4. Right atrial size was mildly dilated.  5. Moderate mitral valve prolapse.  6. The mitral valve is abnormal. Mild thickening of the mitral valve leaflet. There is mild to moderate mitral annular calcification present. There is moderate late systolic prolapse of of both mitral leaflets of the mitral valve.  7. The tricuspid valve is grossly normal.  8. The aortic valve is tricuspid. Mild calcification of the aortic valve. Aortic valve regurgitation is trivial by color flow Doppler. Mild stenosis of the aortic valve.  9. The aorta is normal in size and structure.   Nuclear stress test 04/07/17:  Nuclear stress EF: 61%.  The left ventricular ejection fraction is normal (55-65%).  There was no ST segment deviation noted during stress.  The study is normal.  This is a low risk study. Normal stress nuclear study with no ischemia or infarction; EF 61 with normal wall motion.    Past Medical History:  Diagnosis Date  . Aortic stenosis   . Arthritis   . BPH (benign prostatic hyperplasia)   . Chronic low back pain   . Depression   . Diverticulitis   . Elevated PSA   . Foot drop, right   . Foot drop, right   . Gait disorder 02/03/2015  . Hip pain   . History of colonoscopy   . HOH (hard of hearing)     hearing aid  . Hypertension   . Lumbar radiculopathy   . Mitral valve prolapse   . Onychomycosis   . Peptic ulcer disease   . Sepsis ([place]) 2005   e. coli-after prostate bx  . Sleep apnea   . Torn ACL     Past Surgical History:  Procedure Laterality Date  . CATARACT EXTRACTION Bilateral   . ESOPHAGOGASTRODUODENOSCOPY N/A 08/19/2015   Procedure: ESOPHAGOGASTRODUODENOSCOPY (EGD);  Surgeon: Laurence Spates, MD;  Location: [facility] ENDOSCOPY;  Service: Endoscopy;  Laterality: N/A;  . right knee replacement Right 1996  . SHOULDER SURGERY    . TONSILLECTOMY      MEDICATIONS: . allopurinol (ZYLOPRIM) 100 MG tablet  . aspirin EC 81 MG tablet  . atorvastatin (LIPITOR) 10 MG tablet  . colchicine 0.6 MG tablet  . dutasteride (AVODART) 0.5 MG capsule  . esomeprazole (NEXIUM) 40 MG capsule  . gabapentin (NEURONTIN) 100 MG capsule  . indapamide (LOZOL) 2.5 MG tablet  . mirtazapine (REMERON) 30 MG tablet  . Multiple Vitamin (MULTIVITAMIN) tablet  . Omega-3 Fatty Acids (FISH OIL PO)  . Probiotic Product (PROBIOTIC DAILY PO)  .  Respiratory Therapy Supplies (FLUTTER) DEVI  . tamsulosin (FLOMAX) 0.4 MG CAPS capsule  . Tiotropium Bromide-Olodaterol (STIOLTO RESPIMAT) 2.5-2.5 MCG/ACT AERS  . Turmeric 500 MG CAPS  . verapamil (CALAN-SR) 240 MG CR tablet  . Vitamin D, Ergocalciferol, (DRISDOL) 1.25 MG (50000 UT) CAPS capsule   No current facility-administered medications for this encounter.      Myra Gianotti, PA-C Surgical Short Stay/Anesthesiology [facility] Phone [phone] [facility] Phone [phone] 08/07/2019 4:50 PM

## 2019-08-08 ENCOUNTER — Telehealth: Payer: Self-pay | Admitting: Orthopaedic Surgery

## 2019-08-08 NOTE — Telephone Encounter (Signed)
Mliss Sax from [facility] called to stating that she has some questions in regards to the patient's surgery on Monday, September 28th.  CB#[phone].  Thank you.

## 2019-08-08 NOTE — Telephone Encounter (Signed)
Patrick Franco aware that the hospital case worker will write Rx for facility when he is at the hospital if needed

## 2019-08-09 ENCOUNTER — Other Ambulatory Visit (HOSPITAL_COMMUNITY)
Admission: RE | Admit: 2019-08-09 | Discharge: 2019-08-09 | Disposition: A | Discharge status: Home / Self Care | Payer: Medicare Other | Source: Ambulatory Visit | Attending: Orthopaedic Surgery | Admitting: Orthopaedic Surgery

## 2019-08-09 DIAGNOSIS — Z01812 Encounter for preprocedural laboratory examination: Secondary | ICD-10-CM | POA: Insufficient documentation

## 2019-08-09 DIAGNOSIS — Z20828 Contact with and (suspected) exposure to other viral communicable diseases: Secondary | ICD-10-CM | POA: Insufficient documentation

## 2019-08-10 LAB — NOVEL CORONAVIRUS, NAA (HOSP ORDER, SEND-OUT TO REF LAB; TAT 18-24 HRS): SARS-CoV-2, NAA: NOT DETECTED

## 2019-08-12 ENCOUNTER — Telehealth: Payer: Self-pay | Admitting: *Deleted

## 2019-08-12 ENCOUNTER — Other Ambulatory Visit: Payer: Self-pay | Admitting: Family Medicine

## 2019-08-12 MED ORDER — TRANEXAMIC ACID-NACL 1000-0.7 MG/100ML-% IV SOLN
1000.0000 mg | INTRAVENOUS | Status: AC
  Administered 2019-08-13: 1000 mg via INTRAVENOUS
  Filled 2019-08-12: qty 100

## 2019-08-12 NOTE — Telephone Encounter (Signed)
Ortho bundle pre-op call completed. 

## 2019-08-12 NOTE — Care Plan (Signed)
RNCM called patient to review the Ortho bundle program and discuss his upcoming surgery with Dr. Ninfa Linden on 08/13/19. Patient verbalized he currently lives in independent living at [facility]. He lives with his wife and does not have any stairs to go up/down. He did state he will need a FWW, but has a raised toilet seat in his home. Anticipate home discharge after brief stay in the hospital and HHPT will be needed. Choice provided and [facility]. Will continue to monitor during hospitalization and after for any CM needs.

## 2019-08-13 ENCOUNTER — Other Ambulatory Visit: Payer: Self-pay

## 2019-08-13 ENCOUNTER — Inpatient Hospital Stay (HOSPITAL_COMMUNITY): Payer: Medicare Other

## 2019-08-13 ENCOUNTER — Ambulatory Visit (HOSPITAL_COMMUNITY): Payer: Medicare Other | Admitting: Anesthesiology

## 2019-08-13 ENCOUNTER — Ambulatory Visit (HOSPITAL_COMMUNITY): Payer: Medicare Other | Admitting: Vascular Surgery

## 2019-08-13 ENCOUNTER — Encounter (HOSPITAL_COMMUNITY): Payer: Self-pay | Admitting: Anesthesiology

## 2019-08-13 ENCOUNTER — Inpatient Hospital Stay (HOSPITAL_COMMUNITY)
Admission: RE | Admit: 2019-08-13 | Discharge: 2019-08-15 | DRG: 470 | Disposition: A | Discharge status: Skilled Nursing Facility | Payer: Medicare Other | Attending: Orthopaedic Surgery | Admitting: Orthopaedic Surgery

## 2019-08-13 ENCOUNTER — Ambulatory Visit (HOSPITAL_COMMUNITY): Payer: Medicare Other

## 2019-08-13 ENCOUNTER — Encounter (HOSPITAL_COMMUNITY)
Admission: RE | Disposition: A | Discharge status: Skilled Nursing Facility | Payer: Self-pay | Source: Home / Self Care | Attending: Orthopaedic Surgery

## 2019-08-13 DIAGNOSIS — Z87891 Personal history of nicotine dependence: Secondary | ICD-10-CM | POA: Diagnosis not present

## 2019-08-13 DIAGNOSIS — H9190 Unspecified hearing loss, unspecified ear: Secondary | ICD-10-CM | POA: Diagnosis not present

## 2019-08-13 DIAGNOSIS — Z471 Aftercare following joint replacement surgery: Secondary | ICD-10-CM | POA: Diagnosis not present

## 2019-08-13 DIAGNOSIS — Z6829 Body mass index (BMI) 29.0-29.9, adult: Secondary | ICD-10-CM | POA: Diagnosis not present

## 2019-08-13 DIAGNOSIS — E669 Obesity, unspecified: Secondary | ICD-10-CM | POA: Diagnosis present

## 2019-08-13 DIAGNOSIS — I080 Rheumatic disorders of both mitral and aortic valves: Secondary | ICD-10-CM | POA: Diagnosis present

## 2019-08-13 DIAGNOSIS — Z974 Presence of external hearing-aid: Secondary | ICD-10-CM | POA: Diagnosis not present

## 2019-08-13 DIAGNOSIS — M5416 Radiculopathy, lumbar region: Secondary | ICD-10-CM | POA: Diagnosis present

## 2019-08-13 DIAGNOSIS — F329 Major depressive disorder, single episode, unspecified: Secondary | ICD-10-CM | POA: Diagnosis not present

## 2019-08-13 DIAGNOSIS — Z96641 Presence of right artificial hip joint: Secondary | ICD-10-CM | POA: Diagnosis not present

## 2019-08-13 DIAGNOSIS — M1611 Unilateral primary osteoarthritis, right hip: Secondary | ICD-10-CM | POA: Diagnosis present

## 2019-08-13 DIAGNOSIS — G4730 Sleep apnea, unspecified: Secondary | ICD-10-CM | POA: Diagnosis present

## 2019-08-13 DIAGNOSIS — R5381 Other malaise: Secondary | ICD-10-CM | POA: Diagnosis not present

## 2019-08-13 DIAGNOSIS — M109 Gout, unspecified: Secondary | ICD-10-CM | POA: Diagnosis present

## 2019-08-13 DIAGNOSIS — Z807 Family history of other malignant neoplasms of lymphoid, hematopoietic and related tissues: Secondary | ICD-10-CM

## 2019-08-13 DIAGNOSIS — Z96651 Presence of right artificial knee joint: Secondary | ICD-10-CM | POA: Diagnosis present

## 2019-08-13 DIAGNOSIS — Z801 Family history of malignant neoplasm of trachea, bronchus and lung: Secondary | ICD-10-CM

## 2019-08-13 DIAGNOSIS — I10 Essential (primary) hypertension: Secondary | ICD-10-CM | POA: Diagnosis present

## 2019-08-13 DIAGNOSIS — R279 Unspecified lack of coordination: Secondary | ICD-10-CM | POA: Diagnosis not present

## 2019-08-13 DIAGNOSIS — Z8711 Personal history of peptic ulcer disease: Secondary | ICD-10-CM

## 2019-08-13 DIAGNOSIS — Z419 Encounter for procedure for purposes other than remedying health state, unspecified: Secondary | ICD-10-CM

## 2019-08-13 DIAGNOSIS — K219 Gastro-esophageal reflux disease without esophagitis: Secondary | ICD-10-CM | POA: Diagnosis not present

## 2019-08-13 DIAGNOSIS — Z1159 Encounter for screening for other viral diseases: Secondary | ICD-10-CM

## 2019-08-13 DIAGNOSIS — G8929 Other chronic pain: Secondary | ICD-10-CM | POA: Diagnosis present

## 2019-08-13 DIAGNOSIS — N400 Enlarged prostate without lower urinary tract symptoms: Secondary | ICD-10-CM | POA: Diagnosis present

## 2019-08-13 DIAGNOSIS — M21371 Foot drop, right foot: Secondary | ICD-10-CM | POA: Diagnosis present

## 2019-08-13 DIAGNOSIS — Z743 Need for continuous supervision: Secondary | ICD-10-CM | POA: Diagnosis not present

## 2019-08-13 HISTORY — PX: TOTAL HIP ARTHROPLASTY: SHX124

## 2019-08-13 SURGERY — ARTHROPLASTY, HIP, TOTAL, ANTERIOR APPROACH
Anesthesia: Spinal | Site: Hip | Laterality: Right

## 2019-08-13 MED ORDER — INDAPAMIDE 2.5 MG PO TABS
2.5000 mg | ORAL_TABLET | Freq: Every day | ORAL | Status: DC
  Administered 2019-08-14 – 2019-08-15 (×2): 2.5 mg via ORAL
  Filled 2019-08-13 (×3): qty 1

## 2019-08-13 MED ORDER — ACETAMINOPHEN 325 MG PO TABS
325.0000 mg | ORAL_TABLET | Freq: Four times a day (QID) | ORAL | Status: DC | PRN
  Administered 2019-08-14: 18:00:00 650 mg via ORAL
  Filled 2019-08-13 (×2): qty 2

## 2019-08-13 MED ORDER — HYDROMORPHONE HCL 1 MG/ML IJ SOLN
0.5000 mg | INTRAMUSCULAR | Status: DC | PRN
  Administered 2019-08-13: 20:00:00 1 mg via INTRAVENOUS
  Filled 2019-08-13: qty 1

## 2019-08-13 MED ORDER — BUPIVACAINE IN DEXTROSE 0.75-8.25 % IT SOLN
INTRATHECAL | Status: DC | PRN
  Administered 2019-08-13: 1.8 mL via INTRATHECAL

## 2019-08-13 MED ORDER — PROPOFOL 1000 MG/100ML IV EMUL
INTRAVENOUS | Status: AC
  Filled 2019-08-13: qty 100

## 2019-08-13 MED ORDER — ONDANSETRON HCL 4 MG/2ML IJ SOLN: 4.0000 mg | Freq: Once | INTRAMUSCULAR | Status: DC | PRN

## 2019-08-13 MED ORDER — PHENOL 1.4 % MT LIQD: 1.0000 | OROMUCOSAL | Status: DC | PRN

## 2019-08-13 MED ORDER — ASPIRIN 81 MG PO CHEW
81.0000 mg | CHEWABLE_TABLET | Freq: Two times a day (BID) | ORAL | Status: DC
  Administered 2019-08-13 – 2019-08-15 (×4): 81 mg via ORAL
  Filled 2019-08-13 (×4): qty 1

## 2019-08-13 MED ORDER — ONDANSETRON HCL 4 MG/2ML IJ SOLN
INTRAMUSCULAR | Status: AC
  Filled 2019-08-13: qty 2

## 2019-08-13 MED ORDER — ADULT MULTIVITAMIN W/MINERALS CH
1.0000 | ORAL_TABLET | Freq: Every day | ORAL | Status: DC
  Administered 2019-08-14 – 2019-08-15 (×2): 1 via ORAL
  Filled 2019-08-13 (×2): qty 1

## 2019-08-13 MED ORDER — FENTANYL CITRATE (PF) 250 MCG/5ML IJ SOLN
INTRAMUSCULAR | Status: AC
  Filled 2019-08-13: qty 5

## 2019-08-13 MED ORDER — METOCLOPRAMIDE HCL 5 MG PO TABS: 5.0000 mg | ORAL_TABLET | Freq: Three times a day (TID) | ORAL | Status: DC | PRN

## 2019-08-13 MED ORDER — PROPOFOL 10 MG/ML IV BOLUS
INTRAVENOUS | Status: DC | PRN
  Administered 2019-08-13: 20 mg via INTRAVENOUS

## 2019-08-13 MED ORDER — FENTANYL CITRATE (PF) 100 MCG/2ML IJ SOLN
25.0000 ug | INTRAMUSCULAR | Status: DC | PRN
  Administered 2019-08-13: 50 ug via INTRAVENOUS

## 2019-08-13 MED ORDER — CEFAZOLIN SODIUM-DEXTROSE 1-4 GM/50ML-% IV SOLN
1.0000 g | Freq: Four times a day (QID) | INTRAVENOUS | Status: AC
  Administered 2019-08-13 – 2019-08-14 (×2): 1 g via INTRAVENOUS
  Filled 2019-08-13 (×2): qty 50

## 2019-08-13 MED ORDER — MENTHOL 3 MG MT LOZG: 1.0000 | LOZENGE | OROMUCOSAL | Status: DC | PRN

## 2019-08-13 MED ORDER — ACETAMINOPHEN 500 MG PO TABS
1000.0000 mg | ORAL_TABLET | Freq: Once | ORAL | Status: AC
  Administered 2019-08-13: 1000 mg via ORAL
  Filled 2019-08-13: qty 2

## 2019-08-13 MED ORDER — TAMSULOSIN HCL 0.4 MG PO CAPS
0.4000 mg | ORAL_CAPSULE | Freq: Every day | ORAL | Status: DC
  Administered 2019-08-14 – 2019-08-15 (×2): 0.4 mg via ORAL
  Filled 2019-08-13 (×2): qty 1

## 2019-08-13 MED ORDER — ATORVASTATIN CALCIUM 10 MG PO TABS
10.0000 mg | ORAL_TABLET | Freq: Every day | ORAL | Status: DC
  Administered 2019-08-14: 10 mg via ORAL
  Filled 2019-08-13 (×2): qty 1

## 2019-08-13 MED ORDER — LACTATED RINGERS IV SOLN
INTRAVENOUS | Status: DC
  Administered 2019-08-13 (×2): via INTRAVENOUS

## 2019-08-13 MED ORDER — 0.9 % SODIUM CHLORIDE (POUR BTL) OPTIME
TOPICAL | Status: DC | PRN
  Administered 2019-08-13: 1000 mL

## 2019-08-13 MED ORDER — SODIUM CHLORIDE 0.9 % IV SOLN
INTRAVENOUS | Status: DC | PRN
  Administered 2019-08-13: 50 ug/min via INTRAVENOUS

## 2019-08-13 MED ORDER — ALUM & MAG HYDROXIDE-SIMETH 200-200-20 MG/5ML PO SUSP: 30.0000 mL | ORAL | Status: DC | PRN

## 2019-08-13 MED ORDER — ONDANSETRON HCL 4 MG/2ML IJ SOLN
INTRAMUSCULAR | Status: DC | PRN
  Administered 2019-08-13: 4 mg via INTRAVENOUS

## 2019-08-13 MED ORDER — DIPHENHYDRAMINE HCL 12.5 MG/5ML PO ELIX: 12.5000 mg | ORAL_SOLUTION | ORAL | Status: DC | PRN

## 2019-08-13 MED ORDER — OXYCODONE HCL 5 MG PO TABS
10.0000 mg | ORAL_TABLET | ORAL | Status: DC | PRN
  Administered 2019-08-14: 10 mg via ORAL

## 2019-08-13 MED ORDER — FENTANYL CITRATE (PF) 100 MCG/2ML IJ SOLN
INTRAMUSCULAR | Status: AC
  Filled 2019-08-13: qty 2

## 2019-08-13 MED ORDER — CEFAZOLIN SODIUM-DEXTROSE 2-4 GM/100ML-% IV SOLN
2.0000 g | INTRAVENOUS | Status: AC
  Administered 2019-08-13: 2 g via INTRAVENOUS
  Filled 2019-08-13: qty 100

## 2019-08-13 MED ORDER — OXYCODONE HCL 5 MG PO TABS
5.0000 mg | ORAL_TABLET | ORAL | Status: DC | PRN
  Administered 2019-08-14 (×4): 10 mg via ORAL
  Filled 2019-08-13 (×5): qty 2

## 2019-08-13 MED ORDER — VERAPAMIL HCL ER 240 MG PO TBCR
240.0000 mg | EXTENDED_RELEASE_TABLET | Freq: Every day | ORAL | Status: DC
  Administered 2019-08-14 – 2019-08-15 (×2): 240 mg via ORAL
  Filled 2019-08-13 (×3): qty 1

## 2019-08-13 MED ORDER — CHLORHEXIDINE GLUCONATE 4 % EX LIQD: 60.0000 mL | Freq: Once | CUTANEOUS | Status: DC

## 2019-08-13 MED ORDER — METHOCARBAMOL 500 MG PO TABS
500.0000 mg | ORAL_TABLET | Freq: Four times a day (QID) | ORAL | Status: DC | PRN
  Administered 2019-08-14 (×3): 500 mg via ORAL
  Filled 2019-08-13 (×3): qty 1

## 2019-08-13 MED ORDER — DUTASTERIDE 0.5 MG PO CAPS
0.5000 mg | ORAL_CAPSULE | Freq: Every day | ORAL | Status: DC
  Administered 2019-08-14 – 2019-08-15 (×2): 0.5 mg via ORAL
  Filled 2019-08-13 (×3): qty 1

## 2019-08-13 MED ORDER — SODIUM CHLORIDE 0.9 % IR SOLN
Status: DC | PRN
  Administered 2019-08-13: 1000 mL

## 2019-08-13 MED ORDER — PROPOFOL 500 MG/50ML IV EMUL
INTRAVENOUS | Status: DC | PRN
  Administered 2019-08-13: 20 ug/kg/min via INTRAVENOUS

## 2019-08-13 MED ORDER — OXYCODONE HCL 5 MG/5ML PO SOLN: 5.0000 mg | Freq: Once | ORAL | Status: DC | PRN

## 2019-08-13 MED ORDER — POLYETHYLENE GLYCOL 3350 17 G PO PACK: 17.0000 g | PACK | Freq: Every day | ORAL | Status: DC | PRN

## 2019-08-13 MED ORDER — DOCUSATE SODIUM 100 MG PO CAPS
100.0000 mg | ORAL_CAPSULE | Freq: Two times a day (BID) | ORAL | Status: DC
  Administered 2019-08-14 – 2019-08-15 (×3): 100 mg via ORAL
  Filled 2019-08-13 (×4): qty 1

## 2019-08-13 MED ORDER — ONDANSETRON HCL 4 MG PO TABS: 4.0000 mg | ORAL_TABLET | Freq: Four times a day (QID) | ORAL | Status: DC | PRN

## 2019-08-13 MED ORDER — FENTANYL CITRATE (PF) 100 MCG/2ML IJ SOLN
INTRAMUSCULAR | Status: DC | PRN
  Administered 2019-08-13: 25 ug via INTRAVENOUS

## 2019-08-13 MED ORDER — FENTANYL CITRATE (PF) 100 MCG/2ML IJ SOLN: 25.0000 ug | INTRAMUSCULAR | Status: DC | PRN

## 2019-08-13 MED ORDER — LIDOCAINE 2% (20 MG/ML) 5 ML SYRINGE
INTRAMUSCULAR | Status: AC
  Filled 2019-08-13: qty 5

## 2019-08-13 MED ORDER — METHOCARBAMOL 1000 MG/10ML IJ SOLN
500.0000 mg | Freq: Four times a day (QID) | INTRAVENOUS | Status: DC | PRN
  Filled 2019-08-13: qty 5

## 2019-08-13 MED ORDER — POVIDONE-IODINE 10 % EX SWAB
2.0000 "application " | Freq: Once | CUTANEOUS | Status: AC
  Administered 2019-08-13: 2 via TOPICAL

## 2019-08-13 MED ORDER — GABAPENTIN 100 MG PO CAPS
200.0000 mg | ORAL_CAPSULE | Freq: Every day | ORAL | Status: DC
  Administered 2019-08-13 – 2019-08-14 (×2): 200 mg via ORAL
  Filled 2019-08-13 (×2): qty 2

## 2019-08-13 MED ORDER — ONDANSETRON HCL 4 MG/2ML IJ SOLN: 4.0000 mg | Freq: Four times a day (QID) | INTRAMUSCULAR | Status: DC | PRN

## 2019-08-13 MED ORDER — SODIUM CHLORIDE 0.9 % IV SOLN
INTRAVENOUS | Status: DC
  Administered 2019-08-14: 02:00:00 via INTRAVENOUS

## 2019-08-13 MED ORDER — ALLOPURINOL 100 MG PO TABS
100.0000 mg | ORAL_TABLET | Freq: Every day | ORAL | Status: DC
  Administered 2019-08-14 – 2019-08-15 (×2): 100 mg via ORAL
  Filled 2019-08-13 (×3): qty 1

## 2019-08-13 MED ORDER — MIRTAZAPINE 15 MG PO TABS
30.0000 mg | ORAL_TABLET | Freq: Every day | ORAL | Status: DC
  Administered 2019-08-13 – 2019-08-14 (×2): 30 mg via ORAL
  Filled 2019-08-13 (×2): qty 2

## 2019-08-13 MED ORDER — OXYCODONE HCL 5 MG PO TABS: 5.0000 mg | ORAL_TABLET | Freq: Once | ORAL | Status: DC | PRN

## 2019-08-13 MED ORDER — PANTOPRAZOLE SODIUM 40 MG PO TBEC
40.0000 mg | DELAYED_RELEASE_TABLET | Freq: Every day | ORAL | Status: DC
  Administered 2019-08-14 – 2019-08-15 (×2): 40 mg via ORAL
  Filled 2019-08-13 (×2): qty 1

## 2019-08-13 MED ORDER — ONDANSETRON HCL 4 MG/2ML IJ SOLN: 4.0000 mg | Freq: Once | INTRAMUSCULAR | Status: DC

## 2019-08-13 MED ORDER — METOCLOPRAMIDE HCL 5 MG/ML IJ SOLN: 5.0000 mg | Freq: Three times a day (TID) | INTRAMUSCULAR | Status: DC | PRN

## 2019-08-13 MED ORDER — COLCHICINE 0.6 MG PO TABS: 0.6000 mg | ORAL_TABLET | Freq: Two times a day (BID) | ORAL | Status: DC | PRN

## 2019-08-13 MED ORDER — MIDAZOLAM HCL 2 MG/2ML IJ SOLN
INTRAMUSCULAR | Status: AC
  Filled 2019-08-13: qty 2

## 2019-08-13 SURGICAL SUPPLY — 60 items
ACETAB CUP W GRIPTION 54MM (Plate) ×1 IMPLANT
ACETAB CUP W/GRIPTION 54 (Plate) ×2 IMPLANT
APL SKNCLS STERI-STRIP NONHPOA (GAUZE/BANDAGES/DRESSINGS) ×1
BENZOIN TINCTURE PRP APPL 2/3 (GAUZE/BANDAGES/DRESSINGS) ×3 IMPLANT
BLADE CLIPPER SURG (BLADE) IMPLANT
BLADE SAW SGTL 18X1.27X75 (BLADE) ×2 IMPLANT
BLADE SAW SGTL 18X1.27X75MM (BLADE) ×1
CLOSURE WOUND 1/2 X4 (GAUZE/BANDAGES/DRESSINGS) ×2
COVER SURGICAL LIGHT HANDLE (MISCELLANEOUS) ×3 IMPLANT
COVER WAND RF STERILE (DRAPES) ×3 IMPLANT
CUP ACETAB W/GRIPTION 54 (Plate) IMPLANT
DRAPE C-ARM 42X72 X-RAY (DRAPES) ×3 IMPLANT
DRAPE STERI IOBAN 125X83 (DRAPES) ×3 IMPLANT
DRAPE U-SHAPE 47X51 STRL (DRAPES) ×9 IMPLANT
DRSG AQUACEL AG ADV 3.5X10 (GAUZE/BANDAGES/DRESSINGS) ×3 IMPLANT
DRSG XEROFORM 1X8 (GAUZE/BANDAGES/DRESSINGS) ×2 IMPLANT
DURAPREP 26ML APPLICATOR (WOUND CARE) ×3 IMPLANT
ELECT BLADE 4.0 EZ CLEAN MEGAD (MISCELLANEOUS) ×3
ELECT BLADE 6.5 EXT (BLADE) IMPLANT
ELECT REM PT RETURN 9FT ADLT (ELECTROSURGICAL) ×3
ELECTRODE BLDE 4.0 EZ CLN MEGD (MISCELLANEOUS) ×1 IMPLANT
ELECTRODE REM PT RTRN 9FT ADLT (ELECTROSURGICAL) ×1 IMPLANT
FACESHIELD WRAPAROUND (MASK) ×6 IMPLANT
FACESHIELD WRAPAROUND OR TEAM (MASK) ×2 IMPLANT
GLOVE BIOGEL PI IND STRL 8 (GLOVE) ×2 IMPLANT
GLOVE BIOGEL PI INDICATOR 8 (GLOVE) ×4
GLOVE ECLIPSE 8.0 STRL XLNG CF (GLOVE) ×3 IMPLANT
GLOVE ORTHO TXT STRL SZ7.5 (GLOVE) ×6 IMPLANT
GOWN STRL REUS W/ TWL LRG LVL3 (GOWN DISPOSABLE) ×2 IMPLANT
GOWN STRL REUS W/ TWL XL LVL3 (GOWN DISPOSABLE) ×2 IMPLANT
GOWN STRL REUS W/TWL LRG LVL3 (GOWN DISPOSABLE) ×6
GOWN STRL REUS W/TWL XL LVL3 (GOWN DISPOSABLE) ×6
HANDPIECE INTERPULSE COAX TIP (DISPOSABLE) ×3
HEAD M SROM 36MM PLUS 1.5 (Hips) IMPLANT
KIT BASIN OR (CUSTOM PROCEDURE TRAY) ×3 IMPLANT
KIT TURNOVER KIT B (KITS) ×3 IMPLANT
LINER NEUTRAL 54X36MM PLUS 4 (Hips) ×2 IMPLANT
MANIFOLD NEPTUNE II (INSTRUMENTS) ×3 IMPLANT
NS IRRIG 1000ML POUR BTL (IV SOLUTION) ×3 IMPLANT
PACK TOTAL JOINT (CUSTOM PROCEDURE TRAY) ×3 IMPLANT
PAD ARMBOARD 7.5X6 YLW CONV (MISCELLANEOUS) ×3 IMPLANT
SET HNDPC FAN SPRY TIP SCT (DISPOSABLE) ×1 IMPLANT
SROM M HEAD 36MM PLUS 1.5 (Hips) ×3 IMPLANT
STAPLER VISISTAT 35W (STAPLE) IMPLANT
STEM CORAIL KLA11 (Stem) ×2 IMPLANT
STRIP CLOSURE SKIN 1/2X4 (GAUZE/BANDAGES/DRESSINGS) ×4 IMPLANT
SUT ETHIBOND NAB CT1 #1 30IN (SUTURE) ×3 IMPLANT
SUT MNCRL AB 4-0 PS2 18 (SUTURE) IMPLANT
SUT VIC AB 0 CT1 27 (SUTURE) ×3
SUT VIC AB 0 CT1 27XBRD ANBCTR (SUTURE) ×1 IMPLANT
SUT VIC AB 1 CT1 27 (SUTURE) ×3
SUT VIC AB 1 CT1 27XBRD ANBCTR (SUTURE) ×1 IMPLANT
SUT VIC AB 2-0 CT1 27 (SUTURE) ×3
SUT VIC AB 2-0 CT1 TAPERPNT 27 (SUTURE) ×1 IMPLANT
TOWEL GREEN STERILE (TOWEL DISPOSABLE) ×3 IMPLANT
TOWEL GREEN STERILE FF (TOWEL DISPOSABLE) ×3 IMPLANT
TRAY CATH 16FR W/PLASTIC CATH (SET/KITS/TRAYS/PACK) IMPLANT
TRAY FOLEY W/BAG SLVR 16FR (SET/KITS/TRAYS/PACK)
TRAY FOLEY W/BAG SLVR 16FR ST (SET/KITS/TRAYS/PACK) IMPLANT
WATER STERILE IRR 1000ML POUR (IV SOLUTION) ×6 IMPLANT

## 2019-08-13 NOTE — Op Note (Signed)
NAME: Colford, Foss [id number] ACCOUNT [id number] DATE OF BIRTH:[birth year] FACILITY: [facility] LOCATION: [facility] PHYSICIAN: Kerry Fort, MD  OPERATIVE REPORT  DATE OF PROCEDURE:  08/13/2019  PREOPERATIVE DIAGNOSIS:  Primary osteoarthritis and degenerative joint disease, right hip.  POSTOPERATIVE DIAGNOSIS:  Primary osteoarthritis and degenerative joint disease, right hip.  PROCEDURE:  Right total hip arthroplasty through direct anterior approach.  IMPLANTS:  DePuy Sector Gription acetabular component size 54, size 36+4 neutral polyethylene liner, size 11 Corail femoral component with varus offset, size 36+1.5 metal hip ball.  SURGEON:  Lind Guest. Ninfa Linden, MD  ASSISTANT:  Erskine Emery, PA-C  ANESTHESIA:  Spinal.  ANTIBIOTICS:  Two grams IV Ancef.  ESTIMATED BLOOD LOSS:  400 to 450 mL.  COMPLICATIONS:  None.  INDICATIONS:  The patient is a very pleasant and active 85 year old gentleman with debilitating arthritis involving his right hip that has been well documented.  His pain has become daily and is detrimentally affecting his mobility, his quality of life,  and his activities of daily living to the point he does wish to proceed with a total hip arthroplasty.  We talked about the risk of acute blood loss anemia, nerve and vessel injury, fracture, infection, dislocation, DVT, and implant failure.  We talked  about our goals being decreased pain, improved mobility, and overall improved quality of life.  DESCRIPTION OF PROCEDURE:  After informed consent was obtained and appropriate right hip was marked, he was brought to the operating room and sat up on the stretcher where spinal anesthesia was obtained.  He was then laid in the supine position on a  stretcher.  We assessed his leg length and found his right operative side to be just slightly shorter than the left.  This helped [place] with our intraoperative planning as well.  Traction boots were placed on both  his feet.  Next, he was placed supine on  the Hana fracture table with the perineal post in place and both legs in in-line skeletal traction device and no traction applied.  His right operative hip was prepped and draped with DuraPrep and sterile drapes.  A timeout was called, and he was  identified as correct patient, correct right hip.  We then made an incision just inferior and posterior to the anterior iliac spine and carried this obliquely down the leg.  We dissected down the tensor fascia lata muscle.  The tensor fascia was then  divided longitudinally to proceed with a direct anterior approach to the hip.  We identified and cauterized circumflex vessels.  I then identified the hip capsule.  I opened up the hip capsule in an L-type format finding a moderate joint effusion and  significant periarticular osteophytes around the femoral head and neck.  We then made our femoral neck cut with an oscillating saw just proximal to the lesser trochanter and completed this with an osteotome.  We placed a corkscrew guide in the femoral  head and removed the femoral head in its entirety and found a wide area devoid of cartilage.  I then placed a bent Hohmann over the medial acetabular rim and began removing remnants of the acetabular labrum and other debris.  We then reamed from a size  44 reamer in stepwise increments, going up to a size 53 with all reamers under direct visualization, the last reamer under direct fluoroscopy as well, so we could obtain our depth of reaming, our inclination and anteversion.  We then placed the real  DePuy Sector Gription acetabular component size  54 and a 36+4 neutral polyethylene liner for that size 54 acetabular component.  Attention was then turned to the femur.  With the leg externally rotated to 120 degrees, extended and adducted, we were able  to place a Mueller retractor medially and a Hohmann retractor behind the greater trochanter, released the lateral joint capsule, and  used a box-cutting osteotome to enter the femoral canal and a rongeur to lateralize.  We then began broaching using the  Corail broaching system from a size 8 going up to a size 11.  With the size 11 in place, we tried a varus offset femoral neck and a 36+1.5 hip ball, reduced this in the acetabulum.  We were pleased with the leg length, range of motion, offset, and  stability on exam.  This was assessed radiographically and clinically.  We then dislocated the hip and removed the trial components.  We placed the real Corail femoral component with varus offset size 11 and the real 36+1.5 metal hip ball and again  reduced this in the acetabulum.  We were pleased with stability.  We then irrigated the soft tissue with normal saline solution using pulsatile lavage.  We were able to close the joint capsule with interrupted #1 Ethibond suture, followed by closing the  tensor fascia with #1 Vicryl.  We closed the deep tissue with 0 Vicryl, followed by closing the subcutaneous tissue with 2-0 Vicryl.  Interrupted staples were placed on the skin incision.  Xeroform and an Aquacel dressing were applied.  An in-and-out  catheter was performed at the end of the case.  He was then taken off the Hana table and taken to recovery room in stable condition.  All final counts were correct.  There were no complications noted.  Of note, Benita Stabile, PA-C, assisted during the entire  case.  His assistance was crucial for facilitating all aspects of this case.  LN/NUANCE  D:08/13/2019 T:08/13/2019 JOB:[id number]

## 2019-08-13 NOTE — Care Plan (Signed)
Ortho Bundle Case Management Note  Patient Details  Name: Patrick Franco MRN: [id number] Date of Birth: 02/13/1932  [facility] called patient to review the Ortho bundle program and discuss his upcoming surgery with Dr. Ninfa Linden on 08/13/19. Patient verbalized he currently lives in independent living at [facility]. He lives with his wife and does not have any stairs to go up/down. He did state he will need a FWW, but has a raised toilet seat in his home. Anticipate home discharge after brief stay in the hospital and HHPT will be needed. Choice provided and [facility]. Will continue to monitor during hospitalization and after for any CM needs.                         DME Arranged:  [place] rolling DME Agency:  [employer]  HH Arranged:  PT [place] Agency:  [facility]  Additional Comments: Please contact me with any questions of if this plan should need to change.  Jamse Arn, RN, BSN, [employer]  [phone] 08/13/2019, 3:24 PM

## 2019-08-13 NOTE — Anesthesia Procedure Notes (Signed)
Procedure Name: MAC Date/Time: 08/13/2019 2:25 PM Performed by: Jenne Campus, CRNA Pre-anesthesia Checklist: Patient identified, Emergency Drugs available, Suction available and Patient being monitored Oxygen Delivery Method: Simple face mask

## 2019-08-13 NOTE — Brief Op Note (Signed)
08/13/2019  3:36 PM  PATIENT:  Patrick Franco  85 y.o. male  PRE-OPERATIVE DIAGNOSIS:  End Stage Osteoarthritis Right Hip  POST-OPERATIVE DIAGNOSIS:  End Stage Osteoarthritis Right Hip  PROCEDURE:  Procedure(s): RIGHT TOTAL HIP ARTHROPLASTY ANTERIOR APPROACH (Right)  SURGEON:  Surgeon(s) and Role:    Mcarthur Rossetti, MD - Primary  PHYSICIAN ASSISTANT:  Benita Stabile, PA-C  ANESTHESIA:   spinal  EBL:  400 mL   COUNTS:  YES  DICTATION: .Other Dictation: Dictation Number [id number]  PLAN OF CARE: Admit to inpatient   PATIENT DISPOSITION:  PACU - hemodynamically stable.   Delay start of Pharmacological VTE agent (>24hrs) due to surgical blood loss or risk of bleeding: no

## 2019-08-13 NOTE — H&P (Signed)
TOTAL HIP ADMISSION H&P  Patient is admitted for right total hip arthroplasty.  Subjective:  Chief Complaint: right hip pain  HPI: Patrick Franco, 85 y.o. male, has a history of pain and functional disability in the right hip(s) due to arthritis and patient has failed non-surgical conservative treatments for greater than 12 weeks to include NSAID's and/or analgesics, corticosteriod injections, flexibility and strengthening excercises, use of assistive devices, weight reduction as appropriate and activity modification.  Onset of symptoms was gradual starting 3 years ago with gradually worsening course since that time.The patient noted no past surgery on the right hip(s).  Patient currently rates pain in the right hip at 10 out of 10 with activity. Patient has night pain, worsening of pain with activity and weight bearing, trendelenberg gait, pain that interfers with activities of daily living and pain with passive range of motion. Patient has evidence of subchondral cysts, subchondral sclerosis, periarticular osteophytes and joint space narrowing by imaging studies. This condition presents safety issues increasing the risk of falls.  There is no current active infection.  Patient Active Problem List   Diagnosis Date Noted  . Unilateral primary osteoarthritis, right hip 06/26/2019  . Arthritis of right hip 06/18/2019  . Right hip pain 06/12/2019  . Greater trochanteric bursitis of right hip 05/22/2019  . Depression 02/21/2017  . Degenerative arthritis of left knee 09/16/2016  . Left knee pain 06/02/2016  . Gout 04/12/2016  . Obesity 02/09/2016  . Spinal stenosis, lumbar region, with neurogenic claudication 02/09/2016  . Muscle fatigue 09/21/2015  . Gastric ulcer with hemorrhage 08/19/2015  . Hypertension   . Sleep apnea   . Chronic low back pain   . BPH (benign prostatic hyperplasia)   . Gait disorder 02/03/2015  . Foot drop, right 02/03/2015   Past Medical History:  Diagnosis Date  .  Aortic stenosis   . Arthritis   . BPH (benign prostatic hyperplasia)   . Chronic low back pain   . Depression   . Diverticulitis   . Elevated PSA   . Foot drop, right   . Foot drop, right   . Gait disorder 02/03/2015  . Hip pain   . History of colonoscopy   . HOH (hard of hearing)    hearing aid  . Hypertension   . Lumbar radiculopathy   . Mitral valve prolapse   . Onychomycosis   . Peptic ulcer disease   . Sepsis ([place]) 2005   e. coli-after prostate bx  . Sleep apnea   . Torn ACL   . Tracheobronchomalacia    seen on 2019 Chest CT    Past Surgical History:  Procedure Laterality Date  . CATARACT EXTRACTION Bilateral   . ESOPHAGOGASTRODUODENOSCOPY N/A 08/19/2015   Procedure: ESOPHAGOGASTRODUODENOSCOPY (EGD);  Surgeon: Laurence Spates, MD;  Location: [facility] ENDOSCOPY;  Service: Endoscopy;  Laterality: N/A;  . right knee replacement Right 1996  . SHOULDER SURGERY    . TONSILLECTOMY      Current Facility-Administered Medications  Medication Dose Route Frequency Provider Last Rate Last Dose  . [START ON 08/14/2019] ceFAZolin (ANCEF) IVPB 2g/100 mL premix  2 g Intravenous On Call to OR Pete Pelt, PA-C      . chlorhexidine (HIBICLENS) 4 % liquid 4 application  60 mL Topical Once Erskine Emery W, PA-C      . lactated ringers infusion   Intravenous Continuous Albertha Ghee, MD 10 mL/hr at 08/13/19 1241    . tranexamic acid (CYKLOKAPRON) IVPB 1,000 mg  1,000 mg  Intravenous To OR Mcarthur Rossetti, MD       No Known Allergies  Social History   Tobacco Use  . Smoking status: Former Smoker    Packs/day: 0.50    Types: Cigarettes    Start date: 1960    Quit date: 1961    Years since quitting: 59.7  . Smokeless tobacco: Never Used  . Tobacco comment: Pt smoked for 34month when in [employer]  Substance Use Topics  . Alcohol use: Not on file    Family History  Problem Relation Age of Onset  . Pneumonia Mother 829 . Cancer - Lung Father 587 . Multiple myeloma Brother 551     Review of Systems  Musculoskeletal: Positive for joint pain.  All other systems reviewed and are negative.   Objective:  Physical Exam  Constitutional: He is oriented to person, place, and time. He appears well-developed and well-nourished.  HENT:  Head: Normocephalic and atraumatic.  Eyes: Pupils are equal, round, and reactive to light.  Neck: Normal range of motion.  Cardiovascular: Normal rate.  Respiratory: Effort normal.  GI: Soft.  Musculoskeletal:     Right hip: He exhibits decreased range of motion, decreased strength, tenderness and bony tenderness.  Neurological: He is alert and oriented to person, place, and time.  Skin: Skin is warm and dry.  Psychiatric: He has a normal mood and affect.    Vital signs in last 24 hours: Temp:  [98.2 F (36.8 C)] 98.2 F (36.8 C) (09/29 1215) Pulse Rate:  [101] 101 (09/29 1215) Resp:  [20] 20 (09/29 1215) BP: (162)/(89) 162/89 (09/29 1215) SpO2:  [97 %] 97 % (09/29 1215) Weight:  [93 kg] 93 kg (09/29 1215)  Labs:   Estimated body mass index is 29.41 kg/m as calculated from the following:   Height as of this encounter: 5' 10"  (1.778 m).   Weight as of this encounter: 93 kg.   Imaging Review Plain radiographs demonstrate severe degenerative joint disease of the right hip(s). The bone quality appears to be good for age and reported activity level.      Assessment/Plan:  End stage arthritis, right hip(s)  The patient history, physical examination, clinical judgement of the provider and imaging studies are consistent with end stage degenerative joint disease of the right hip(s) and total hip arthroplasty is deemed medically necessary. The treatment options including medical management, injection therapy, arthroscopy and arthroplasty were discussed at length. The risks and benefits of total hip arthroplasty were presented and reviewed. The risks due to aseptic loosening, infection, stiffness, dislocation/subluxation,   thromboembolic complications and other imponderables were discussed.  The patient acknowledged the explanation, agreed to proceed with the plan and consent was signed. Patient is being admitted for inpatient treatment for surgery, pain control, PT, OT, prophylactic antibiotics, VTE prophylaxis, progressive ambulation and ADL's and discharge planning.The patient is planning to be discharged home with home health services

## 2019-08-13 NOTE — Progress Notes (Signed)
RT set up patient home unit CPAP. NO O2 bleed in needed. Patient able to place hisself on and off.

## 2019-08-13 NOTE — Anesthesia Preprocedure Evaluation (Addendum)
Anesthesia Evaluation  Patient identified by MRN, date of birth, ID band Patient awake    Reviewed: Allergy & Precautions, NPO status , Patient's Chart, lab work & pertinent test results  Airway Mallampati: III  TM Distance: >3 FB Neck ROM: Full    Dental no notable dental hx. (+) Teeth Intact, Dental Advisory Given   Pulmonary sleep apnea and Continuous Positive Airway Pressure Ventilation , former smoker,    Pulmonary exam normal breath sounds clear to auscultation       Cardiovascular hypertension, Normal cardiovascular exam+ Valvular Problems/Murmurs MVP  Rhythm:Regular Rate:Normal  TTE 05/2019 LVEF >65%, moderate LVH, normal wall motion, moderate LAE, mild RAE, thickened mitral leaflets with moderate end-systolic bileaflet prolapse and mild regurgitation, very mild aortic stenosis (mean gradient 17 mmHg - AVA 2.28 cm2, based on LVOT diameter of 2.0 cm), trivial aortic regurgitation, normal IVC  Stress Test 2018 Nuclear stress EF: 61%. The left ventricular ejection fraction is normal (55-65%). There was no ST segment deviation noted during stress. The study is normal. This is a low risk study.   Neuro/Psych PSYCHIATRIC DISORDERS Depression negative neurological ROS     GI/Hepatic Neg liver ROS, PUD, GERD  Medicated,  Endo/Other  negative endocrine ROS  Renal/GU negative Renal ROS  negative genitourinary   Musculoskeletal  (+) Arthritis ,   Abdominal   Peds  Hematology negative hematology ROS (+) plt 228   Anesthesia Other Findings   Reproductive/Obstetrics                            Anesthesia Physical Anesthesia Plan  ASA: III  Anesthesia Plan: Spinal   Post-op Pain Management:    Induction:   PONV Risk Score and Plan: 1 and Treatment may vary due to age or medical condition and Propofol infusion  Airway Management Planned: Natural Airway  Additional Equipment:    Intra-op Plan:   Post-operative Plan:   Informed Consent: I have reviewed the patients History and Physical, chart, labs and discussed the procedure including the risks, benefits and alternatives for the proposed anesthesia with the patient or authorized representative who has indicated his/her understanding and acceptance.     Dental advisory given  Plan Discussed with: CRNA  Anesthesia Plan Comments:         Anesthesia Quick Evaluation

## 2019-08-13 NOTE — Transfer of Care (Signed)
Immediate Anesthesia Transfer of Care Note  Patient: Patrick Franco  Procedure(s) Performed: RIGHT TOTAL HIP ARTHROPLASTY ANTERIOR APPROACH (Right Hip)  Patient Location: PACU  Anesthesia Type:MAC and Spinal  Level of Consciousness: awake, oriented and patient cooperative  Airway & Oxygen Therapy: Patient Spontanous Breathing and Patient connected to face mask oxygen  Post-op Assessment: Report given to RN and Post -op Vital signs reviewed and stable  Post vital signs: Reviewed  Last Vitals:  Vitals Value Taken Time  BP 121/76 08/13/19 1552  Temp    Pulse 72 08/13/19 1554  Resp 21 08/13/19 1554  SpO2 94 % 08/13/19 1554  Vitals shown include unvalidated device data.  Last Pain:  Vitals:   08/13/19 1234  TempSrc:   PainSc: 0-No pain         Complications: No apparent anesthesia complications

## 2019-08-13 NOTE — Evaluation (Signed)
Physical Therapy Evaluation Patient Details Name: Patrick Franco MRN: [id number] DOB: 1931-12-28 Today's Date: 08/13/2019   History of Present Illness  Pt is an 85 y/o male s/p R THA, direct anterior approach. PMH includes MVP, R foot drop, and HTN.   Clinical Impression  Pt is s/p surgery above with deficits below. Pt with increased nausea, so mobility limited to transfer to chair. Required min A for mobility using RW. Will continue to follow acutely to maximize functional mobility independence and safety.     Follow Up Recommendations Follow surgeon's recommendation for DC plan and follow-up therapies    Equipment Recommendations  Rolling walker with 5" wheels;3in1 (PT)    Recommendations for Other Services       Precautions / Restrictions Precautions Precautions: Fall Restrictions Weight Bearing Restrictions: Yes RLE Weight Bearing: Weight bearing as tolerated      Mobility  Bed Mobility Overal bed mobility: Needs Assistance Bed Mobility: Supine to Sit     Supine to sit: Min assist     General bed mobility comments: Min A for trunk elevation. Increased time required to come to EOB.   Transfers Overall transfer level: Needs assistance Equipment used: Rolling walker (2 wheeled) Transfers: Sit to/from [facility] Sit to Stand: Min assist Stand pivot transfers: Min assist       General transfer comment: Min A for lift assist and steadying to stand. Cues for safe hand placement. Pt with increased nausea, so mobility limited to chair. Required min A for steadying throughout transfer to chair.   Ambulation/Gait                Stairs            Wheelchair Mobility    Modified Rankin (Stroke Patients Only)       Balance Overall balance assessment: Needs assistance Sitting-balance support: Feet supported;No upper extremity supported Sitting balance-Leahy Scale: Fair     Standing balance support: Bilateral upper extremity  supported;During functional activity Standing balance-Leahy Scale: Poor Standing balance comment: Reliant on BUE support                              Pertinent Vitals/Pain Pain Assessment: 0-10 Pain Score: 5  Pain Location: R hip  Pain Descriptors / Indicators: Aching;Operative site guarding Pain Intervention(s): Limited activity within patient's tolerance;Monitored during session;Repositioned    Home Living Family/patient expects to be discharged to:: Private residence Living Arrangements: Spouse/significant other Available Help at Discharge: Family Type of Home: Independent living facility([facility]) Home Access: Level entry     Home Layout: One level Home Equipment: Other (comment)(loftstrand crutch)      Prior Function Level of Independence: Independent with assistive device(s)         Comments: Pt was using single loftstrand crutch for mobility.      Hand Dominance        Extremity/Trunk Assessment   Upper Extremity Assessment Upper Extremity Assessment: Defer to OT evaluation    Lower Extremity Assessment Lower Extremity Assessment: RLE deficits/detail RLE Deficits / Details: Deficits consistent with post op pain and weakness.     Cervical / Trunk Assessment Cervical / Trunk Assessment: Normal  Communication   Communication: No difficulties  Cognition Arousal/Alertness: Awake/alert Behavior During Therapy: WFL for tasks assessed/performed Overall Cognitive Status: Within Functional Limits for tasks assessed  General Comments General comments (skin integrity, edema, etc.): Pt's wife present during session.     Exercises     Assessment/Plan    PT Assessment Patient needs continued PT services  PT Problem List Decreased strength;Decreased balance;Decreased activity tolerance;Decreased mobility;Decreased knowledge of use of DME;Pain       PT Treatment Interventions DME  instruction;Gait training;Therapeutic activities;Functional mobility training;Therapeutic exercise;Balance training;Patient/family education    PT Goals (Current goals can be found in the Care Plan section)  Acute Rehab PT Goals Patient Stated Goal: to feel better PT Goal Formulation: With patient Time For Goal Achievement: 08/27/19 Potential to Achieve Goals: Good    Frequency 7X/week   Barriers to discharge        Co-evaluation               AM-PAC PT "6 Clicks" Mobility  Outcome Measure Help needed turning from your back to your side while in a flat bed without using bedrails?: A Little Help needed moving from lying on your back to sitting on the side of a flat bed without using bedrails?: A Little Help needed moving to and from a bed to a chair (including a wheelchair)?: A Little Help needed standing up from a chair using your arms (e.g., wheelchair or bedside chair)?: A Little Help needed to walk in hospital room?: A Lot Help needed climbing 3-5 steps with a railing? : A Lot 6 Click Score: 16    End of Session Equipment Utilized During Treatment: Gait belt Activity Tolerance: Treatment limited secondary to medical complications (Comment)(nausea) Patient left: in chair;with call bell/phone within reach;with family/visitor present Nurse Communication: Mobility status;Other (comment)(pt with nausea) PT Visit Diagnosis: Unsteadiness on feet (R26.81);Muscle weakness (generalized) (M62.81);Pain Pain - Right/Left: Right Pain - part of body: Hip    Time: [id number] PT Time Calculation (min) (ACUTE ONLY): 18 min   Charges:   PT Evaluation $PT Eval Low Complexity: [place], PT, DPT  Acute Rehabilitation Services  Pager: [phone] Office: [phone]   Rudean Hitt 08/13/2019, 6:28 PM

## 2019-08-13 NOTE — Anesthesia Procedure Notes (Signed)
Spinal  Patient location during procedure: OR Start time: 08/13/2019 2:05 PM End time: 08/13/2019 2:15 PM Staffing Anesthesiologist: Freddrick March, MD Performed: anesthesiologist  Preanesthetic Checklist Completed: patient identified, surgical consent, pre-op evaluation, timeout performed, IV checked, risks and benefits discussed and monitors and equipment checked Spinal Block Patient position: sitting Prep: site prepped and draped and DuraPrep Patient monitoring: cardiac monitor, continuous pulse ox and blood pressure Approach: midline Location: L3-4 Injection technique: single-shot Needle Needle type: Quincke  Needle gauge: 22 G Needle length: 9 cm Assessment Sensory level: T6 Additional Notes Functioning IV was confirmed and monitors were applied. Sterile prep and drape, including hand hygiene and sterile gloves were used. The patient was positioned and the spine was prepped. The skin was anesthetized with lidocaine.  Free flow of clear CSF was obtained prior to injecting local anesthetic into the CSF.  The spinal needle aspirated freely following injection.  The needle was carefully withdrawn.  The patient tolerated the procedure well.

## 2019-08-14 ENCOUNTER — Encounter (HOSPITAL_COMMUNITY): Payer: Self-pay | Admitting: Orthopaedic Surgery

## 2019-08-14 LAB — CBC
HCT: 35.9 % — ABNORMAL LOW (ref 39.0–52.0)
Hemoglobin: 12.9 g/dL — ABNORMAL LOW (ref 13.0–17.0)
MCH: 33.9 pg (ref 26.0–34.0)
MCHC: 35.9 g/dL (ref 30.0–36.0)
MCV: 94.2 fL (ref 80.0–100.0)
Platelets: 188 10*3/uL (ref 150–400)
RBC: 3.81 MIL/uL — ABNORMAL LOW (ref 4.22–5.81)
RDW: 12.9 % (ref 11.5–15.5)
WBC: 9.9 10*3/uL (ref 4.0–10.5)
nRBC: 0 % (ref 0.0–0.2)

## 2019-08-14 LAB — BASIC METABOLIC PANEL
Anion gap: 10 (ref 5–15)
BUN: 17 mg/dL (ref 8–23)
CO2: 28 mmol/L (ref 22–32)
Calcium: 8.4 mg/dL — ABNORMAL LOW (ref 8.9–10.3)
Chloride: 94 mmol/L — ABNORMAL LOW (ref 98–111)
Creatinine, Ser: 0.88 mg/dL (ref 0.61–1.24)
GFR calc Af Amer: >60 mL/min (ref >60)
GFR calc non Af Amer: >60 mL/min (ref >60)
Glucose, Bld: 144 mg/dL — ABNORMAL HIGH (ref 70–99)
Potassium: 4 mmol/L (ref 3.5–5.1)
Sodium: 132 mmol/L — ABNORMAL LOW (ref 135–145)

## 2019-08-14 MED ORDER — SODIUM CHLORIDE 0.9% FLUSH
3.0000 mL | Freq: Two times a day (BID) | INTRAVENOUS | Status: DC
  Administered 2019-08-14 – 2019-08-15 (×3): 3 mL via INTRAVENOUS

## 2019-08-14 MED ORDER — SODIUM CHLORIDE 0.9% FLUSH: 3.0000 mL | INTRAVENOUS | Status: DC | PRN

## 2019-08-14 NOTE — Progress Notes (Signed)
Patient placed himself on home CPAP unit for the night.  

## 2019-08-14 NOTE — Progress Notes (Signed)
Subjective: 1 Day Post-Op Procedure(s) (LRB): RIGHT TOTAL HIP ARTHROPLASTY ANTERIOR APPROACH (Right) Patient reports pain as mild to moderate.   Objective: Vital signs in last 24 hours: Temp:  [97.3 F (36.3 C)-98.9 F (37.2 C)] 97.3 F (36.3 C) (09/30 0409) Pulse Rate:  [65-101] 87 (09/30 0409) Resp:  [15-24] 17 (09/30 0409) BP: (102-162)/(50-96) 129/75 (09/30 0409) SpO2:  [94 %-99 %] 99 % (09/30 0409) Weight:  [93 kg] 93 kg (09/29 1215)  Intake/Output from previous day: 09/29 0701 - 09/30 0700 In: 2250 [P.O.:150; I.V.:2000; IV Piggyback:100] Out: 1300 [Urine:900; Blood:400] Intake/Output this shift: No intake/output data recorded.  Recent Labs    08/14/19 0202  HGB 12.9*   Recent Labs    08/14/19 0202  WBC 9.9  RBC 3.81*  HCT 35.9*  PLT 188   Recent Labs    08/14/19 0202  NA 132*  K 4.0  CL 94*  CO2 28  BUN 17  CREATININE 0.88  GLUCOSE 144*  CALCIUM 8.4*   No results for input(s): LABPT, INR in the last 72 hours.  Sensation intact distally Dorsiflexion/Plantar flexion intact Incision: dressing C/D/I Compartment soft   Assessment/Plan: 1 Day Post-Op Procedure(s) (LRB): RIGHT TOTAL HIP ARTHROPLASTY ANTERIOR APPROACH (Right) Up with therapy        08/14/2019, 8:27 AM

## 2019-08-14 NOTE — Progress Notes (Signed)
Patient ID: Patrick Franco, male   DOB: 06/09/1932, 85 y.o.   MRN: [id number] I just examined the patient.  He has very limited mobility with therapy thus far.  He definitely needs to be an inpatient admission because he can not be safely discharged today.  He will likely need short-term skilled nursing at [facility] (he is currently in independent living there).

## 2019-08-14 NOTE — Progress Notes (Signed)
Physical Therapy Treatment Patient Details Name: Patrick Franco MRN: [id number] DOB: 08-24-1932 Today's Date: 08/14/2019    History of Present Illness Pt is an 85 y/o male s/p R THA, direct anterior approach. PMH includes MVP, R foot drop, and HTN.     PT Comments    Pt tolerated treatment well, progressing to ambulation training during session and requiring reduced assistance requirements during transfers. Pt's endurance and tolerance for activity remains limited and falls risk is increased due to LE power and transfer deficits. Pt will continue to benefit from acute PT POC to aide in a return to independent mobility. Recommendations: Home with home health PT, RW, 3 in 1 commode.   Follow Up Recommendations  Follow surgeon's recommendation for DC plan and follow-up therapies     Equipment Recommendations  Rolling walker with 5" wheels;3in1 (PT)    Recommendations for Other Services       Precautions / Restrictions Precautions Precautions: Fall Restrictions Weight Bearing Restrictions: Yes RLE Weight Bearing: Weight bearing as tolerated    Mobility  Bed Mobility                  Transfers Overall transfer level: Needs assistance Equipment used: Rolling walker (2 wheeled) Transfers: Sit to/from Stand Sit to Stand: Min assist         General transfer comment: Pt requiring minA for 2 sit to stand transfers from elevated bed and recliner  Ambulation/Gait Ambulation/Gait assistance: Min guard Gait Distance (Feet): 30 Feet Assistive device: Rolling walker (2 wheeled) Gait Pattern/deviations: Step-to pattern;Decreased step length - right;Decreased step length - left Gait velocity: reduced Gait velocity interpretation: <1.8 ft/sec, indicate of risk for recurrent falls General Gait Details: Pt with shortened step to gait with reduced stance time on RLE   Stairs             Wheelchair Mobility    Modified Rankin (Stroke Patients Only)       Balance  Overall balance assessment: Needs assistance Sitting-balance support: Single extremity supported;Feet supported Sitting balance-Leahy Scale: Fair     Standing balance support: Bilateral upper extremity supported Standing balance-Leahy Scale: Fair Standing balance comment: Reliant on BUE support                             Cognition Arousal/Alertness: Awake/alert Behavior During Therapy: WFL for tasks assessed/performed Overall Cognitive Status: Within Functional Limits for tasks assessed                                        Exercises Total Joint Exercises Ankle Circles/Pumps: AROM;20 reps;Both Quad Sets: AROM;Both;5 reps    General Comments        Pertinent Vitals/Pain Pain Assessment: Faces Faces Pain Scale: Hurts little more Pain Location: R hip  Pain Descriptors / Indicators: Aching;Operative site guarding Pain Intervention(s): Limited activity within patient's tolerance    Home Living                      Prior Function            PT Goals (current goals can now be found in the care plan section) Acute Rehab PT Goals Patient Stated Goal: To get better Progress towards PT goals: Progressing toward goals    Frequency    7X/week      PT Plan Current plan  remains appropriate    Co-evaluation              AM-PAC PT "6 Clicks" Mobility   Outcome Measure  Help needed turning from your back to your side while in a flat bed without using bedrails?: A Little Help needed moving from lying on your back to sitting on the side of a flat bed without using bedrails?: A Little Help needed moving to and from a bed to a chair (including a wheelchair)?: A Little Help needed standing up from a chair using your arms (e.g., wheelchair or bedside chair)?: A Little Help needed to walk in hospital room?: A Little Help needed climbing 3-5 steps with a railing? : A Lot 6 Click Score: 17    End of Session Equipment Utilized During  Treatment: Gait belt Activity Tolerance: Patient tolerated treatment well Patient left: in chair;with call bell/phone within reach Nurse Communication: Mobility status PT Visit Diagnosis: Unsteadiness on feet (R26.81);Muscle weakness (generalized) (M62.81);Pain Pain - Right/Left: Right Pain - part of body: Hip     Time: [id number] PT Time Calculation (min) (ACUTE ONLY): 26 min  Charges:  $Gait Training: 8-22 mins $Therapeutic Exercise: 8-22 mins                     Zenaida Niece, PT, DPT Acute Rehabilitation Pager: [phone]    Zenaida Niece 08/14/2019, 9:20 AM

## 2019-08-14 NOTE — TOC Initial Note (Signed)
Transition of Care [facility]) - Initial/Assessment Note    Patient Details  Name: Patrick Franco MRN: [id number] Date of Birth: November 29, 1931  Transition of Care [facility]) CM/SW Contact:    Midge Minium RN, BSN, NCM-BC, ACM-RN [phone] Phone Number: 08/14/2019, 9:54 AM  Clinical Narrative:                 CM following for dispositional needs. Patient is an Ortho bundle with [place] pre-arranged with [facility]; RW pre-arranged with [employer]. CM will continue to follow.   Expected Discharge Plan: [place] Barriers to Discharge: Continued Medical Work up   Expected Discharge Plan and Services Expected Discharge Plan: [place]                 DME Arranged: Walker rolling DME Agency: [employer]       HH Arranged: PT [place]: [facility] (now [facility])     Admission diagnosis:  End Stage Osteoarthritis Right Hip Patient Active Problem List   Diagnosis Date Noted  . Status post total replacement of right hip 08/13/2019  . Unilateral primary osteoarthritis, right hip 06/26/2019  . Arthritis of right hip 06/18/2019  . Right hip pain 06/12/2019  . Greater trochanteric bursitis of right hip 05/22/2019  . Depression 02/21/2017  . Degenerative arthritis of left knee 09/16/2016  . Left knee pain 06/02/2016  . Gout 04/12/2016  . Obesity 02/09/2016  . Spinal stenosis, lumbar region, with neurogenic claudication 02/09/2016  . Muscle fatigue 09/21/2015  . Gastric ulcer with hemorrhage 08/19/2015  . Hypertension   . Sleep apnea   . Chronic low back pain   . BPH (benign prostatic hyperplasia)   . Gait disorder 02/03/2015  . Foot drop, right 02/03/2015   PCP:  Lavone Orn, MD Pharmacy:   [facility], [place] [address] Phone: [phone] Fax: [phone]     Social Determinants of Health (SDOH) Interventions    Readmission Risk Interventions No flowsheet  data found.

## 2019-08-14 NOTE — Progress Notes (Signed)
OT Cancellation Note  Patient Details Name: Patrick Franco MRN: [id number] DOB: Dec 11, 1931   Cancelled Treatment:    Reason Eval/Treat Not Completed: Other (comment)   Pt plans to go to rehab at [facility]. Will defer OT eval to rehab at [facility]. Pt agreeable.  Kari Baars, OT Acute Rehabilitation Services [phone] Office- [phone], Edwena Felty D 08/14/2019, 2:11 PM

## 2019-08-14 NOTE — Progress Notes (Signed)
Physical Therapy Treatment Patient Details Name: Patrick Franco MRN: [id number] DOB: 1932/03/26 Today's Date: 08/14/2019    History of Present Illness Pt is an 85 y/o male s/p R THA, direct anterior approach. PMH includes MVP, R foot drop, and HTN.     PT Comments    Pt tolerated treatment well despite significant R hip pain. Pt continues to require physical assistance to perform transfers and bed mobility, but does show increased tolerance for OOB activity. Pt will continue to benefit from acute PT POC to progress upon his current functional deficits and restore independence. Recommendations: Home with home health PT and a RW (RW already delivered to room) and 3 in 1 commode.  Follow Up Recommendations  Follow surgeon's recommendation for DC plan and follow-up therapies     Equipment Recommendations  Rolling walker with 5" wheels;3in1 (PT)    Recommendations for Other Services       Precautions / Restrictions Precautions Precautions: Fall Restrictions Weight Bearing Restrictions: Yes RLE Weight Bearing: Weight bearing as tolerated    Mobility  Bed Mobility Overal bed mobility: Needs Assistance Bed Mobility: Sit to Supine       Sit to supine: Min assist   General bed mobility comments: minA for RLE management  Transfers Overall transfer level: Needs assistance Equipment used: Rolling walker (2 wheeled) Transfers: Sit to/from Stand Sit to Stand: Min assist         General transfer comment: Pt requires minA for sit to stand. PT attempting to encourage increased trunk flexion and rocking to gain momentum, however pt limited in ability to rock 2/2 hip pain  Ambulation/Gait Ambulation/Gait assistance: Min guard Gait Distance (Feet): 70 Feet Assistive device: Rolling walker (2 wheeled) Gait Pattern/deviations: Step-through pattern(R foot drag intermittently) Gait velocity: reduced Gait velocity interpretation: <1.8 ft/sec, indicate of risk for recurrent falls      Stairs             Wheelchair Mobility    Modified Rankin (Stroke Patients Only)       Balance Overall balance assessment: Needs assistance Sitting-balance support: Single extremity supported;Feet supported Sitting balance-Leahy Scale: Fair     Standing balance support: Bilateral upper extremity supported Standing balance-Leahy Scale: Fair Standing balance comment: Reliant on BUE support                             Cognition Arousal/Alertness: Awake/alert Behavior During Therapy: WFL for tasks assessed/performed Overall Cognitive Status: Within Functional Limits for tasks assessed                                        Exercises      General Comments        Pertinent Vitals/Pain Pain Assessment: Faces Faces Pain Scale: Hurts whole lot Pain Location: R hip  Pain Descriptors / Indicators: Aching;Operative site guarding Pain Intervention(s): Limited activity within patient's tolerance    Home Living                      Prior Function            PT Goals (current goals can now be found in the care plan section) Acute Rehab PT Goals Patient Stated Goal: To get better Progress towards PT goals: Progressing toward goals    Frequency    7X/week  PT Plan Current plan remains appropriate    Co-evaluation              AM-PAC PT "6 Clicks" Mobility   Outcome Measure  Help needed turning from your back to your side while in a flat bed without using bedrails?: A Little Help needed moving from lying on your back to sitting on the side of a flat bed without using bedrails?: A Little Help needed moving to and from a bed to a chair (including a wheelchair)?: A Little Help needed standing up from a chair using your arms (e.g., wheelchair or bedside chair)?: A Little Help needed to walk in hospital room?: A Little Help needed climbing 3-5 steps with a railing? : A Lot 6 Click Score: 17    End of Session  Equipment Utilized During Treatment: Gait belt Activity Tolerance: Patient tolerated treatment well Patient left: in bed;with call bell/phone within reach;with bed alarm set;with family/visitor present Nurse Communication: Mobility status PT Visit Diagnosis: Unsteadiness on feet (R26.81);Muscle weakness (generalized) (M62.81);Pain Pain - Right/Left: Right Pain - part of body: Hip     Time: [id number] PT Time Calculation (min) (ACUTE ONLY): 23 min  Charges:  $Gait Training: 8-22 mins $Therapeutic Activity: 8-22 mins                     Zenaida Niece, PT, DPT Acute Rehabilitation Pager: [phone]  Zenaida Niece 08/14/2019, 2:43 PM

## 2019-08-14 NOTE — Plan of Care (Signed)
  Problem: Education: Goal: Knowledge of General Education information will improve Description: Including pain rating scale, medication(s)/side effects and non-pharmacologic comfort measures Outcome: Progressing   Problem: Clinical Measurements: Goal: Will remain free from infection Outcome: Progressing   Problem: Coping: Goal: Level of anxiety will decrease Outcome: Progressing   Problem: Elimination: Goal: Will not experience complications related to urinary retention Outcome: Progressing

## 2019-08-15 ENCOUNTER — Telehealth: Payer: Self-pay | Admitting: *Deleted

## 2019-08-15 MED ORDER — METHOCARBAMOL 500 MG PO TABS: 500.0000 mg | ORAL_TABLET | Freq: Four times a day (QID) | ORAL | 1 refills | Status: DC | PRN

## 2019-08-15 MED ORDER — OXYCODONE HCL 5 MG PO TABS: 5.0000 mg | ORAL_TABLET | ORAL | 0 refills | Status: DC | PRN

## 2019-08-15 MED ORDER — ASPIRIN 81 MG PO CHEW: 81.0000 mg | CHEWABLE_TABLET | Freq: Two times a day (BID) | ORAL | 0 refills | Status: DC

## 2019-08-15 NOTE — Progress Notes (Signed)
Subjective: 2 Days Post-Op Procedure(s) (LRB): RIGHT TOTAL HIP ARTHROPLASTY ANTERIOR APPROACH (Right) Patient reports pain as moderate.    Objective: Vital signs in last 24 hours: Temp:  [98.2 F (36.8 C)-99.2 F (37.3 C)] 99.2 F (37.3 C) (10/01 0350) Pulse Rate:  [76-92] 82 (10/01 0350) Resp:  [16-24] 17 (10/01 0350) BP: (112-145)/(58-80) 125/66 (10/01 0350) SpO2:  [90 %-95 %] 94 % (10/01 0350)  Intake/Output from previous day: 09/30 0701 - 10/01 0700 In: 473.7 [P.O.:360; I.V.:113.7] Out: 500 [Urine:500] Intake/Output this shift: No intake/output data recorded.  Recent Labs    08/14/19 0202  HGB 12.9*   Recent Labs    08/14/19 0202  WBC 9.9  RBC 3.81*  HCT 35.9*  PLT 188   Recent Labs    08/14/19 0202  NA 132*  K 4.0  CL 94*  CO2 28  BUN 17  CREATININE 0.88  GLUCOSE 144*  CALCIUM 8.4*   No results for input(s): LABPT, INR in the last 72 hours.  Sensation intact distally Intact pulses distally Dorsiflexion/Plantar flexion intact Incision: scant drainage   Assessment/Plan: 2 Days Post-Op Procedure(s) (LRB): RIGHT TOTAL HIP ARTHROPLASTY ANTERIOR APPROACH (Right) Up with therapy Discharge to SNF possibly this afternoon if bed available in the skilled nursing section of [facility]   Anticipated LOS equal to or greater than 2 midnights due to - Age 83 and older with one or more of the following:  - Obesity  - Expected need for hospital services (PT, OT, Nursing) required for safe  discharge  - Anticipated need for postoperative skilled nursing care or inpatient rehab  - Active co-morbidities: None OR   - Unanticipated findings during/Post Surgery: None  - Patient is a high risk of re-admission due to: None    Mcarthur Rossetti 08/15/2019, 7:35 AM

## 2019-08-15 NOTE — Discharge Instructions (Signed)

## 2019-08-15 NOTE — Progress Notes (Signed)
Subjective: 2 Days Post-Op Procedure(s) (LRB): RIGHT TOTAL HIP ARTHROPLASTY ANTERIOR APPROACH (Right) Patient reports pain as moderate.   Slowly progressing with PT.  Objective: Vital signs in last 24 hours: Temp:  [98.3 F (36.8 C)-99.2 F (37.3 C)] 98.6 F (37 C) (10/01 0755) Pulse Rate:  [53-82] 53 (10/01 0755) Resp:  [16-18] 16 (10/01 0755) BP: (112-125)/(58-76) 124/63 (10/01 0755) SpO2:  [90 %-94 %] 90 % (10/01 0755)  Intake/Output from previous day: 09/30 0701 - 10/01 0700 In: 473.7 [P.O.:360; I.V.:113.7] Out: 500 [Urine:500] Intake/Output this shift: Total I/O In: 120 [P.O.:120] Out: -   Recent Labs    08/14/19 0202  HGB 12.9*   Recent Labs    08/14/19 0202  WBC 9.9  RBC 3.81*  HCT 35.9*  PLT 188   Recent Labs    08/14/19 0202  NA 132*  K 4.0  CL 94*  CO2 28  BUN 17  CREATININE 0.88  GLUCOSE 144*  CALCIUM 8.4*   No results for input(s): LABPT, INR in the last 72 hours.  Right lower Extremity: Sensation intact distally Dorsiflexion/Plantar flexion intact Incision: scant drainage Compartment soft   Assessment/Plan: 2 Days Post-Op Procedure(s) (LRB): RIGHT TOTAL HIP ARTHROPLASTY ANTERIOR APPROACH (Right) Up with therapy  Discharge to [facility] today.         08/15/2019, 12:55 PM

## 2019-08-15 NOTE — Progress Notes (Signed)
Physical Therapy Treatment Patient Details Name: Patrick Franco MRN: [id number] DOB: July 01, 1932 Today's Date: 08/15/2019    History of Present Illness Pt is an 85 y/o male s/p R THA, direct anterior approach. PMH includes MVP, R foot drop, and HTN.     PT Comments    Pt's tolerance for OOB mobility and activity remains limited 2/2 pain and fatigue. Pt in a bad mood this morning, but willing to work with PT. Pt with improved transfer quality, requiring less physical assistance, however is still requiring assistance for all bed mobility at this time. Pt feels the need for short term rehab due to weakness and wants to start this as soon as possible. Pt will continue to benefit from acute PT POC to reduce falls risk and aide in a return to independent mobility. Recommendations: Pt will benefit from moderate intensity inpatient PT services upon discharge to aide in a progression to their prior level of function.   Follow Up Recommendations  SNF(pt requesting short term rehab)     Equipment Recommendations  Rolling walker with 5" wheels;3in1 (PT)    Recommendations for Other Services       Precautions / Restrictions Precautions Precautions: Fall Restrictions Weight Bearing Restrictions: Yes RLE Weight Bearing: Weight bearing as tolerated    Mobility  Bed Mobility   Bed Mobility: Supine to Sit     Supine to sit: Min assist     General bed mobility comments: Pt requiring minA for RLE management and balance during mobility  Transfers Overall transfer level: Needs assistance Equipment used: Rolling walker (2 wheeled) Transfers: Sit to/from Stand Sit to Stand: Min guard            Ambulation/Gait Ambulation/Gait assistance: Min guard Gait Distance (Feet): 30 Feet Assistive device: Rolling walker (2 wheeled) Gait Pattern/deviations: Step-through pattern(PT providing verbal cues to transition to step through gait) Gait velocity: reduced Gait velocity interpretation: <1.31  ft/sec, indicative of household Metallurgist Rankin (Stroke Patients Only)       Balance Overall balance assessment: Needs assistance Sitting-balance support: Bilateral upper extremity supported;Feet supported Sitting balance-Leahy Scale: Fair     Standing balance support: Bilateral upper extremity supported Standing balance-Leahy Scale: Fair                              Cognition Arousal/Alertness: Awake/alert Behavior During Therapy: WFL for tasks assessed/performed Overall Cognitive Status: Within Functional Limits for tasks assessed                                        Exercises      General Comments        Pertinent Vitals/Pain Pain Assessment: Faces Faces Pain Scale: Hurts little more Pain Location: R hip  Pain Descriptors / Indicators: Aching;Operative site guarding Pain Intervention(s): Limited activity within patient's tolerance    Home Living                      Prior Function            PT Goals (current goals can now be found in the care plan section) Acute Rehab PT Goals Patient Stated Goal: To discharge to short term rehab PT Goal Formulation: With  patient Progress towards PT goals: Progressing toward goals    Frequency    7X/week      PT Plan Discharge plan needs to be updated    Co-evaluation              AM-PAC PT "6 Clicks" Mobility   Outcome Measure  Help needed turning from your back to your side while in a flat bed without using bedrails?: A Little Help needed moving from lying on your back to sitting on the side of a flat bed without using bedrails?: A Little Help needed moving to and from a bed to a chair (including a wheelchair)?: A Little Help needed standing up from a chair using your arms (e.g., wheelchair or bedside chair)?: A Little Help needed to walk in hospital room?: A Little Help needed climbing 3-5 steps  with a railing? : A Lot 6 Click Score: 17    End of Session Equipment Utilized During Treatment: Gait belt Activity Tolerance: Patient tolerated treatment well Patient left: in chair;with call bell/phone within reach;with chair alarm set Nurse Communication: Mobility status PT Visit Diagnosis: Unsteadiness on feet (R26.81);Muscle weakness (generalized) (M62.81);Pain Pain - Right/Left: Right Pain - part of body: Hip     Time: 0755-0819 PT Time Calculation (min) (ACUTE ONLY): 24 min  Charges:  $Gait Training: 8-22 mins $Therapeutic Activity: 8-22 mins                     Zenaida Niece, PT, DPT Acute Rehabilitation Pager: [phone]    Zenaida Niece 08/15/2019, 8:32 AM

## 2019-08-15 NOTE — Care Plan (Signed)
RNCM was contacted by patient's wife this morning stating that he has changed his mind after having surgery and reports he now wants to go to Skilled nursing facility at [facility] instead of back home. She feels she would not be able to provide care he may need at this time. RNCM spoke with staff at [facility] facility, Donna-SW, at [phone]. Discussed that patient is ready for D/C today. Patient will need another Covid test prior to discharge and confirmed that results could be pending at time of admission to their facility. Contact made to Dr. Ninfa Linden and relayed information. Dr. Ninfa Linden confirmed patient is medically ready for discharge and will complete discharge summary and order Covid testing today. Anticipate d/c to [facility] today. Updated [facility] that referral is canceled at this time. Also made [employer] aware, for DME purposes, that plan has changed to SNF.

## 2019-08-15 NOTE — Discharge Summary (Signed)
Patient ID: Patrick Franco MRN: [id number] DOB/AGE: 03-15-1932 85 y.o.  Admit date: 08/13/2019 Discharge date: 08/15/2019  Admission Diagnoses:  Principal Problem:   Unilateral primary osteoarthritis, right hip Active Problems:   Status post total replacement of right hip   Discharge Diagnoses:  Same  Past Medical History:  Diagnosis Date  . Aortic stenosis   . Arthritis   . BPH (benign prostatic hyperplasia)   . Chronic low back pain   . Depression   . Diverticulitis   . Elevated PSA   . Foot drop, right   . Foot drop, right   . Gait disorder 02/03/2015  . Hip pain   . History of colonoscopy   . HOH (hard of hearing)    hearing aid  . Hypertension   . Lumbar radiculopathy   . Mitral valve prolapse   . Onychomycosis   . Peptic ulcer disease   . Sepsis ([place]) 2005   e. coli-after prostate bx  . Sleep apnea   . Torn ACL   . Tracheobronchomalacia    seen on 2019 Chest CT    Surgeries: Procedure(s): RIGHT TOTAL HIP ARTHROPLASTY ANTERIOR APPROACH on 08/13/2019   Consultants:   Discharged Condition: Improved  Hospital Course: Patrick Franco is an 85 y.o. male who was admitted 08/13/2019 for operative treatment ofUnilateral primary osteoarthritis, right hip. Patient has severe unremitting pain that affects sleep, daily activities, and work/hobbies. After pre-op clearance the patient was taken to the operating room on 08/13/2019 and underwent  Procedure(s): RIGHT TOTAL HIP ARTHROPLASTY ANTERIOR APPROACH.    Patient was given perioperative antibiotics:  Anti-infectives (From admission, onward)   Start     Dose/Rate Route Frequency Ordered Stop   08/14/19 0600  ceFAZolin (ANCEF) IVPB 2g/100 mL premix     2 g 200 mL/hr over 30 Minutes Intravenous On call to O.R. 08/13/19 1212 08/13/19 1440   08/13/19 2030  ceFAZolin (ANCEF) IVPB 1 g/50 mL premix     1 g 100 mL/hr over 30 Minutes Intravenous Every 6 hours 08/13/19 1751 08/14/19 0206       Patient was given sequential  compression devices, early ambulation, and chemoprophylaxis to prevent DVT.  Patient benefited maximally from hospital stay and there were no complications.    Recent vital signs:  Patient Vitals for the past 24 hrs:  BP Temp Temp src Pulse Resp SpO2  08/15/19 0755 124/63 98.6 F (37 C) Oral (!) 53 16 90 %  08/15/19 0350 125/66 99.2 F (37.3 C) Oral 82 17 94 %  08/14/19 1942 (!) 116/58 98.3 F (36.8 C) Oral 76 16 93 %  08/14/19 1537 112/76 98.8 F (37.1 C) Oral 82 18 90 %  08/14/19 1336 120/70 - - 80 18 94 %     Recent laboratory studies:  Recent Labs    08/14/19 0202  WBC 9.9  HGB 12.9*  HCT 35.9*  PLT 188  NA 132*  K 4.0  CL 94*  CO2 28  BUN 17  CREATININE 0.88  GLUCOSE 144*  CALCIUM 8.4*     Discharge Medications:   Allergies as of 08/15/2019   No Known Allergies     Medication List    STOP taking these medications   aspirin EC 81 MG tablet Replaced by: aspirin 81 MG chewable tablet     TAKE these medications   allopurinol 100 MG tablet Commonly known as: ZYLOPRIM TAKE 1 TABLET ONCE DAILY.   aspirin 81 MG chewable tablet Chew 1 tablet (81 mg  total) by mouth 2 (two) times daily. Replaces: aspirin EC 81 MG tablet   atorvastatin 10 MG tablet Commonly known as: LIPITOR Take 10 mg by mouth daily.   colchicine 0.6 MG tablet Take 1 tablet (0.6 mg total) by mouth 2 (two) times daily as needed. What changed: reasons to take this   dutasteride 0.5 MG capsule Commonly known as: AVODART Take 0.5 mg by mouth daily.   esomeprazole 40 MG capsule Commonly known as: NEXIUM Take 1 capsule (40 mg total) by mouth 2 (two) times daily before a meal. What changed: when to take this   FISH OIL PO Take 1,000 mg by mouth daily.   Flutter Devi Use the flutter valve 10 times when doing it daily.   gabapentin 100 MG capsule Commonly known as: NEURONTIN Take 2 capsules (200 mg total) by mouth at bedtime.   indapamide 2.5 MG tablet Commonly known as:  LOZOL Take 2.5 mg by mouth daily.   methocarbamol 500 MG tablet Commonly known as: ROBAXIN Take 1 tablet (500 mg total) by mouth every 6 (six) hours as needed for muscle spasms.   mirtazapine 30 MG tablet Commonly known as: REMERON TAKE ONE TABLET AT BEDTIME.   multivitamin tablet Take 1 tablet by mouth daily.   oxyCODONE 5 MG immediate release tablet Commonly known as: Oxy IR/ROXICODONE Take 1-2 tablets (5-10 mg total) by mouth every 4 (four) hours as needed for moderate pain (pain score 4-6).   PROBIOTIC DAILY PO Take 1 tablet by mouth daily.   tamsulosin 0.4 MG Caps capsule Commonly known as: FLOMAX Take 0.4 mg by mouth daily.   Tiotropium Bromide-Olodaterol 2.5-2.5 MCG/ACT Aers Commonly known as: Stiolto Respimat Inhale 2 puffs into the lungs daily. What changed:   when to take this  reasons to take this   Turmeric 500 MG Caps Take 500 mg by mouth daily.   verapamil 240 MG CR tablet Commonly known as: CALAN-SR Take 240 mg by mouth daily.   Vitamin D (Ergocalciferol) 1.25 MG (50000 UT) Caps capsule Commonly known as: DRISDOL TAKE 1 CAPSULE WEEKLY. What changed: when to take this            Durable Medical Equipment  (From admission, onward)         Start     Ordered   08/13/19 1752  DME Walker rolling  Once    Question:  Patient needs a walker to treat with the following condition  Answer:  Status post total replacement of right hip   08/13/19 1751   08/13/19 1752  DME 3 n 1  Once     08/13/19 1751          Diagnostic Studies: Dg Pelvis Portable  Result Date: 08/13/2019 CLINICAL DATA:  Postoperative total hip replacement EXAM: PORTABLE PELVIS 1-2 VIEWS COMPARISON:  Intraoperative films of August 13, 2019 FINDINGS: Surgical staples overlie the right hip. No change in appearance of right hip arthroplasty. Femoral head is located. No signs of fracture or complication. Degenerative changes again noted in the left hip. Degenerative changes also  noted in the lumbar spine. IMPRESSION: Status post right total hip arthroplasty without complicating features. Degenerative changes in the left hip. Electronically Signed   By: Zetta Bills M.D.   On: 08/13/2019 16:26   Dg C-arm 1-60 Min  Result Date: 08/13/2019 CLINICAL DATA:  Status post right hip arthroplasty. EXAM: DG C-ARM 1-60 MIN; OPERATIVE RIGHT HIP WITH PELVIS CONTRAST:  None. FLUOROSCOPY TIME:  Radiation Exposure Index (if provided  by the fluoroscopic device): 2.9723 mGy. COMPARISON:  Radiographs of June 12, 2019. FINDINGS: Three intraoperative fluoroscopic images were obtained of the right hip. The right femoral and acetabular components appear to be well situated. IMPRESSION: Fluoroscopic guidance provided during right total hip arthroplasty. Electronically Signed   By: Marijo Conception M.D.   On: 08/13/2019 15:47   Dg Hip Operative Unilat With Pelvis Right  Result Date: 08/13/2019 CLINICAL DATA:  Status post right hip arthroplasty. EXAM: DG C-ARM 1-60 MIN; OPERATIVE RIGHT HIP WITH PELVIS CONTRAST:  None. FLUOROSCOPY TIME:  Radiation Exposure Index (if provided by the fluoroscopic device): 2.9723 mGy. COMPARISON:  Radiographs of June 12, 2019. FINDINGS: Three intraoperative fluoroscopic images were obtained of the right hip. The right femoral and acetabular components appear to be well situated. IMPRESSION: Fluoroscopic guidance provided during right total hip arthroplasty. Electronically Signed   By: Marijo Conception M.D.   On: 08/13/2019 15:47    Disposition:     Follow-up Information    Mcarthur Rossetti, MD. Go on 08/27/2019.   Specialty: Orthopedic Surgery Why: 2 week post-op for wound check at 9:45 am. Contact information: [place] [address] [phone]        [facility] SNF/ALF Follow up.   Specialties: [place], [place] Why: Event organiser information: [address] [place] [phone]           Signed: Erskine Emery 08/15/2019, 12:59 PM

## 2019-08-15 NOTE — TOC Transition Note (Addendum)
Transition of Care [facility]) - CM/SW Discharge Note   Patient Details  Name: Patrick Franco MRN: [id number] Date of Birth: 1932/06/21  Transition of Care [facility] CM/SW Contact:  Midge Minium RN, BSN, NCM-BC, ACM-RN [phone] Phone Number: 08/15/2019, 11:19 AM   Clinical Narrative:    Patient will now requesting ST SNF at discharge, with PT eval complete; SNF recommended. CM spoke to patient/spouse to discuss the POC, with patient requesting to transition to [facility]; patient currently lives in independent living at [facility]. CM spoke to Admissions SW at [facility] with an FL2/DC summary needed for admission today. CM completed the FL2; awaiting DC summary. No COVID results required prior to discharge, nor a 3 midnight stay. PTAR arranged with DC summary faxed to the facility.  Please call report to: [phone] Room: [facility]    Final next level of care: Skilled Nursing Facility Barriers to Discharge: No Barriers Identified   Patient Goals and CMS Choice   CMS Medicare.gov Compare Post Acute Care list provided to:: Patient Choice offered to / list presented to : Patient  Discharge Placement PASRR number recieved: 08/15/19            Patient chooses bed at: [facility] Patient to be transferred to facility by: [place] Name of family member notified: Patient/Ellen Tullos (spouse) Patient and family notified of of transfer: 08/15/19  Discharge Plan and Services          DME Arranged: N/A DME Agency: NA       HH Arranged: NA HH Agency: NA  Social Determinants of Health (SDOH) Interventions     Readmission Risk Interventions No flowsheet data found.

## 2019-08-15 NOTE — NC FL2 (Signed)
[place] LEVEL OF CARE SCREENING TOOL     IDENTIFICATION  Patient Name: Patrick Franco Birthdate: 04/13/1932 Sex: male Admission Date (Current Location): 08/13/2019  [facility] and [place] Number:  Herbalist and Address:  [facility], [place] [address], [place], [place] [address]      Provider Number: [id number]  Attending Physician Name and Address:  Mcarthur Rossetti,*  Relative Name and Phone Number:  Kweli Vandemark [phone]    Current Level of Care: Hospital Recommended Level of Care: [place] Prior Approval Number:    Date Approved/Denied:   PASRR Number: [id number]  Discharge Plan: SNF    Current Diagnoses: Patient Active Problem List   Diagnosis Date Noted  . Status post total replacement of right hip 08/13/2019  . Unilateral primary osteoarthritis, right hip 06/26/2019  . Arthritis of right hip 06/18/2019  . Right hip pain 06/12/2019  . Greater trochanteric bursitis of right hip 05/22/2019  . Depression 02/21/2017  . Degenerative arthritis of left knee 09/16/2016  . Left knee pain 06/02/2016  . Gout 04/12/2016  . Obesity 02/09/2016  . Spinal stenosis, lumbar region, with neurogenic claudication 02/09/2016  . Muscle fatigue 09/21/2015  . Gastric ulcer with hemorrhage 08/19/2015  . Hypertension   . Sleep apnea   . Chronic low back pain   . BPH (benign prostatic hyperplasia)   . Gait disorder 02/03/2015  . Foot drop, right 02/03/2015    Orientation RESPIRATION BLADDER Height & Weight     Self, Time, Situation, Place  Normal Continent Weight: 93 kg Height:  5\' 10"  (177.8 cm)  BEHAVIORAL SYMPTOMS/MOOD NEUROLOGICAL BOWEL NUTRITION STATUS  Other (Comment)(N/A) (N/A) Continent Diet  AMBULATORY STATUS COMMUNICATION OF NEEDS Skin   Extensive Assist Verbally Surgical wounds(Right hip incision)                       Personal Care Assistance Level of Assistance  Bathing, Feeding, Dressing  Bathing Assistance: Limited assistance Feeding assistance: Limited assistance Dressing Assistance: Limited assistance     Functional Limitations Info  Sight, Hearing, Speech Sight Info: Adequate Hearing Info: Impaired(Hearing aids) Speech Info: Adequate    SPECIAL CARE FACTORS FREQUENCY  PT (By licensed PT), OT (By licensed OT)     PT Frequency: 7x/week OT Frequency: 3x/week            Contractures Contractures Info: Not present    Additional Factors Info  Code Status, Allergies Code Status Info: Full Code Allergies Info: NKDA           Current Medications (08/15/2019):  This is the current hospital active medication list Current Facility-Administered Medications  Medication Dose Route Frequency Provider Last Rate Last Dose  . acetaminophen (TYLENOL) tablet 325-650 mg  325-650 mg Oral Q6H PRN Mcarthur Rossetti, MD   650 mg at 08/14/19 1738  . allopurinol (ZYLOPRIM) tablet 100 mg  100 mg Oral Daily Mcarthur Rossetti, MD   100 mg at 08/14/19 0835  . alum & mag hydroxide-simeth (MAALOX/MYLANTA) 200-200-20 MG/5ML suspension 30 mL  30 mL Oral Q4H PRN Mcarthur Rossetti, MD      . aspirin chewable tablet 81 mg  81 mg Oral BID Mcarthur Rossetti, MD   81 mg at 08/15/19 0943  . atorvastatin (LIPITOR) tablet 10 mg  10 mg Oral q1800 Mcarthur Rossetti, MD   10 mg at 08/14/19 1738  . colchicine tablet 0.6 mg  0.6 mg Oral BID  PRN Mcarthur Rossetti, MD      . diphenhydrAMINE (BENADRYL) 12.5 MG/5ML elixir 12.5-25 mg  12.5-25 mg Oral Q4H PRN Mcarthur Rossetti, MD      . docusate sodium (COLACE) capsule 100 mg  100 mg Oral BID Mcarthur Rossetti, MD   100 mg at 08/14/19 2106  . dutasteride (AVODART) capsule 0.5 mg  0.5 mg Oral Daily Mcarthur Rossetti, MD   0.5 mg at 08/14/19 1236  . gabapentin (NEURONTIN) capsule 200 mg  200 mg Oral QHS Mcarthur Rossetti, MD   200 mg at 08/14/19 2106  . HYDROmorphone (DILAUDID) injection 0.5-1 mg   0.5-1 mg Intravenous Q4H PRN Mcarthur Rossetti, MD   1 mg at 08/13/19 1952  . indapamide (LOZOL) tablet 2.5 mg  2.5 mg Oral Daily Mcarthur Rossetti, MD   2.5 mg at 08/15/19 0942  . menthol-cetylpyridinium (CEPACOL) lozenge 3 mg  1 lozenge Oral PRN Mcarthur Rossetti, MD       Or  . phenol (CHLORASEPTIC) mouth spray 1 spray  1 spray Mouth/Throat PRN Mcarthur Rossetti, MD      . methocarbamol (ROBAXIN) tablet 500 mg  500 mg Oral Q6H PRN Mcarthur Rossetti, MD   500 mg at 08/14/19 1738   Or  . methocarbamol (ROBAXIN) 500 mg in dextrose 5 % 50 mL IVPB  500 mg Intravenous Q6H PRN Mcarthur Rossetti, MD      . metoCLOPramide (REGLAN) tablet 5-10 mg  5-10 mg Oral Q8H PRN Mcarthur Rossetti, MD       Or  . metoCLOPramide (REGLAN) injection 5-10 mg  5-10 mg Intravenous Q8H PRN Mcarthur Rossetti, MD      . mirtazapine (REMERON) tablet 30 mg  30 mg Oral QHS Mcarthur Rossetti, MD   30 mg at 08/14/19 2106  . multivitamin with minerals tablet 1 tablet  1 tablet Oral Daily Mcarthur Rossetti, MD   1 tablet at 08/15/19 [id number]  . ondansetron (ZOFRAN) tablet 4 mg  4 mg Oral Q6H PRN Mcarthur Rossetti, MD       Or  . ondansetron [facility]) injection 4 mg  4 mg Intravenous Q6H PRN Mcarthur Rossetti, MD      . oxyCODONE (Oxy IR/ROXICODONE) immediate release tablet 10-15 mg  10-15 mg Oral Q4H PRN Mcarthur Rossetti, MD   10 mg at 08/14/19 1236  . oxyCODONE (Oxy IR/ROXICODONE) immediate release tablet 5-10 mg  5-10 mg Oral Q4H PRN Mcarthur Rossetti, MD   10 mg at 08/14/19 2134  . pantoprazole (PROTONIX) EC tablet 40 mg  40 mg Oral Daily Mcarthur Rossetti, MD   40 mg at 08/14/19 0834  . polyethylene glycol (MIRALAX / GLYCOLAX) packet 17 g  17 g Oral Daily PRN Mcarthur Rossetti, MD      . sodium chloride flush (NS) 0.9 % injection 3 mL  3 mL Intravenous Q12H Mcarthur Rossetti, MD   3 mL at 08/14/19 2109  . sodium chloride flush  (NS) 0.9 % injection 3 mL  3 mL Intravenous PRN Mcarthur Rossetti, MD      . tamsulosin [facility]) capsule 0.4 mg  0.4 mg Oral Daily Mcarthur Rossetti, MD   0.4 mg at 08/14/19 [id number]  . verapamil (CALAN-SR) CR tablet 240 mg  240 mg Oral Daily Mcarthur Rossetti, MD   240 mg at 08/14/19 [id number]     Discharge Medications: Please see discharge summary for a list of  discharge medications.  Relevant Imaging Results:  Relevant Lab Results:   Additional Information SSN: [id number]  [place], BSN, NCM-BC, ACM-RN [phone]

## 2019-08-15 NOTE — Plan of Care (Signed)
  Problem: Activity: Goal: Risk for activity intolerance will decrease Outcome: Progressing   

## 2019-08-15 NOTE — Progress Notes (Signed)
Physical Therapy Treatment Patient Details Name: Patrick Franco MRN: [id number] DOB: 1932/06/29 Today's Date: 08/15/2019    History of Present Illness Pt is an 85 y/o male s/p R THA, direct anterior approach. PMH includes MVP, R foot drop, and HTN.     PT Comments    Pt tolerated treatment well with increased ambulation distance and reduced assistance requirements for all functional mobility. Pt with pattern of improved mobility in afternoons, PT provides education to attempt HEP in morning prior to mobilizing OOB to reduce stiffness. Pt will continue to benefit from acute PT services to aide in a return to his prior level of function.  Follow Up Recommendations  SNF     Equipment Recommendations  Rolling walker with 5" wheels;3in1 (PT)    Recommendations for Other Services       Precautions / Restrictions Precautions Precautions: Fall Restrictions Weight Bearing Restrictions: Yes RLE Weight Bearing: Weight bearing as tolerated    Mobility  Bed Mobility Overal bed mobility: Needs Assistance Bed Mobility: Supine to Sit     Supine to sit: Supervision     General bed mobility comments: Pt utilizing LLE to hook under R ankle to mobilize RLE  Transfers Overall transfer level: Needs assistance Equipment used: Rolling walker (2 wheeled) Transfers: Sit to/from Stand Sit to Stand: Min guard            Ambulation/Gait Ambulation/Gait assistance: Min guard Gait Distance (Feet): 100 Feet Assistive device: Rolling walker (2 wheeled) Gait Pattern/deviations: Step-through pattern;Decreased step length - right;Decreased step length - left Gait velocity: reduced Gait velocity interpretation: <1.8 ft/sec, indicate of risk for recurrent falls General Gait Details: Pt with slowed step through gait with reduced stride length   Stairs             Wheelchair Mobility    Modified Rankin (Stroke Patients Only)       Balance Overall balance assessment: Needs  assistance Sitting-balance support: Feet supported;Single extremity supported Sitting balance-Leahy Scale: Fair     Standing balance support: Bilateral upper extremity supported Standing balance-Leahy Scale: Fair                              Cognition Arousal/Alertness: Awake/alert Behavior During Therapy: WFL for tasks assessed/performed Overall Cognitive Status: Within Functional Limits for tasks assessed                                        Exercises      General Comments        Pertinent Vitals/Pain Pain Assessment: Faces Faces Pain Scale: Hurts a little bit Pain Location: R hip  Pain Descriptors / Indicators: Aching;Operative site guarding Pain Intervention(s): Limited activity within patient's tolerance    Home Living                      Prior Function            PT Goals (current goals can now be found in the care plan section) Acute Rehab PT Goals Patient Stated Goal: To discharge to short term rehab Progress towards PT goals: Progressing toward goals    Frequency    7X/week      PT Plan Current plan remains appropriate    Co-evaluation              AM-PAC PT "6  Clicks" Mobility   Outcome Measure  Help needed turning from your back to your side while in a flat bed without using bedrails?: None Help needed moving from lying on your back to sitting on the side of a flat bed without using bedrails?: None Help needed moving to and from a bed to a chair (including a wheelchair)?: A Little Help needed standing up from a chair using your arms (e.g., wheelchair or bedside chair)?: A Little Help needed to walk in hospital room?: A Little Help needed climbing 3-5 steps with a railing? : A Lot 6 Click Score: 19    End of Session Equipment Utilized During Treatment: Gait belt Activity Tolerance: Patient tolerated treatment well Patient left: in chair;with call bell/phone within reach;with chair alarm set;with  family/visitor present Nurse Communication: Mobility status PT Visit Diagnosis: Unsteadiness on feet (R26.81);Muscle weakness (generalized) (M62.81);Pain Pain - Right/Left: Right Pain - part of body: Hip     Time: [id number] PT Time Calculation (min) (ACUTE ONLY): 23 min  Charges:  $Gait Training: 8-22 mins $Therapeutic Activity: 8-22 mins                     Zenaida Niece, PT, DPT Acute Rehabilitation Pager: [phone]    Zenaida Niece 08/15/2019, 2:24 PM

## 2019-08-15 NOTE — Telephone Encounter (Signed)
Ortho bundle CM plan of care.

## 2019-08-15 NOTE — Plan of Care (Signed)
  Problem: Health Behavior/Discharge Planning: Goal: Ability to manage health-related needs will improve Outcome: Progressing   Problem: Clinical Measurements: Goal: Will remain free from infection Outcome: Progressing   Problem: Activity: Goal: Risk for activity intolerance will decrease Outcome: Progressing   Problem: Nutrition: Goal: Adequate nutrition will be maintained Outcome: Progressing   Problem: Coping: Goal: Level of anxiety will decrease Outcome: Progressing   Problem: Safety: Goal: Ability to remain free from injury will improve Outcome: Progressing   

## 2019-08-16 ENCOUNTER — Telehealth: Payer: Self-pay | Admitting: *Deleted

## 2019-08-16 DIAGNOSIS — M6389 Disorders of muscle in diseases classified elsewhere, multiple sites: Secondary | ICD-10-CM | POA: Diagnosis not present

## 2019-08-16 DIAGNOSIS — M1611 Unilateral primary osteoarthritis, right hip: Secondary | ICD-10-CM | POA: Diagnosis not present

## 2019-08-16 DIAGNOSIS — Z471 Aftercare following joint replacement surgery: Secondary | ICD-10-CM | POA: Diagnosis not present

## 2019-08-16 DIAGNOSIS — M62551 Muscle wasting and atrophy, not elsewhere classified, right thigh: Secondary | ICD-10-CM | POA: Diagnosis not present

## 2019-08-16 DIAGNOSIS — M25551 Pain in right hip: Secondary | ICD-10-CM | POA: Diagnosis not present

## 2019-08-16 DIAGNOSIS — R278 Other lack of coordination: Secondary | ICD-10-CM | POA: Diagnosis not present

## 2019-08-16 DIAGNOSIS — R2689 Other abnormalities of gait and mobility: Secondary | ICD-10-CM | POA: Diagnosis not present

## 2019-08-16 LAB — NOVEL CORONAVIRUS, NAA (HOSP ORDER, SEND-OUT TO REF LAB; TAT 18-24 HRS): SARS-CoV-2, NAA: NOT DETECTED

## 2019-08-16 NOTE — Anesthesia Postprocedure Evaluation (Signed)
Anesthesia Post Note  Patient: Patrick Franco  Procedure(s) Performed: RIGHT TOTAL HIP ARTHROPLASTY ANTERIOR APPROACH (Right Hip)     Patient location during evaluation: PACU Anesthesia Type: Spinal and MAC Level of consciousness: oriented and awake and alert Pain management: pain level controlled Vital Signs Assessment: post-procedure vital signs reviewed and stable Respiratory status: spontaneous breathing, respiratory function stable and patient connected to nasal cannula oxygen Cardiovascular status: blood pressure returned to baseline and stable Postop Assessment: no headache, no backache and no apparent nausea or vomiting Anesthetic complications: no    Last Vitals:  Vitals:   08/15/19 0350 08/15/19 0755  BP: 125/66 124/63  Pulse: 82 (!) 53  Resp: 17 16  Temp: 37.3 C 37 C  SpO2: 94% 90%    Last Pain:  Vitals:   08/15/19 1000  TempSrc:   PainSc: 0-No pain                 , S

## 2019-08-16 NOTE — Care Plan (Signed)
RNCM call to patient to check status after discharge from hospital yesterday, 08/15/19 to [facility]. He was groggy on the phone and states he has received therapy today. Discussed rehab and a short term stay there. Reminded of appointment for 2 week check with Dr. Ninfa Linden on 08/27/19. Will continue to follow for any CM needs.

## 2019-08-16 NOTE — Telephone Encounter (Signed)
Ortho bundle D/C call completed. See care plan note. 

## 2019-08-19 ENCOUNTER — Encounter: Payer: Self-pay | Admitting: Internal Medicine

## 2019-08-19 ENCOUNTER — Non-Acute Institutional Stay (SKILLED_NURSING_FACILITY): Payer: Medicare Other | Admitting: Internal Medicine

## 2019-08-19 DIAGNOSIS — F3289 Other specified depressive episodes: Secondary | ICD-10-CM

## 2019-08-19 DIAGNOSIS — N401 Enlarged prostate with lower urinary tract symptoms: Secondary | ICD-10-CM

## 2019-08-19 DIAGNOSIS — M6389 Disorders of muscle in diseases classified elsewhere, multiple sites: Secondary | ICD-10-CM | POA: Diagnosis not present

## 2019-08-19 DIAGNOSIS — R300 Dysuria: Secondary | ICD-10-CM

## 2019-08-19 DIAGNOSIS — M1611 Unilateral primary osteoarthritis, right hip: Secondary | ICD-10-CM

## 2019-08-19 DIAGNOSIS — R3911 Hesitancy of micturition: Secondary | ICD-10-CM | POA: Diagnosis not present

## 2019-08-19 DIAGNOSIS — Z20828 Contact with and (suspected) exposure to other viral communicable diseases: Secondary | ICD-10-CM | POA: Diagnosis not present

## 2019-08-19 DIAGNOSIS — R509 Fever, unspecified: Secondary | ICD-10-CM

## 2019-08-19 DIAGNOSIS — R21 Rash and other nonspecific skin eruption: Secondary | ICD-10-CM

## 2019-08-19 DIAGNOSIS — R278 Other lack of coordination: Secondary | ICD-10-CM | POA: Diagnosis not present

## 2019-08-19 DIAGNOSIS — M62551 Muscle wasting and atrophy, not elsewhere classified, right thigh: Secondary | ICD-10-CM | POA: Diagnosis not present

## 2019-08-19 DIAGNOSIS — M5416 Radiculopathy, lumbar region: Secondary | ICD-10-CM

## 2019-08-19 DIAGNOSIS — R2689 Other abnormalities of gait and mobility: Secondary | ICD-10-CM | POA: Diagnosis not present

## 2019-08-19 DIAGNOSIS — M25551 Pain in right hip: Secondary | ICD-10-CM | POA: Diagnosis not present

## 2019-08-19 NOTE — Progress Notes (Signed)
NURSING HOME LOCATION: [facility]  ROOM NUMBER:  101  CODE STATUS:  Full Code  PCP:  Hollace Kinnier DO  This is a comprehensive admission note to [place] performed on this date less than 30 days from date of admission. Included are preadmission medical/surgical history; reconciled medication list; family history; social history and comprehensive review of systems.  Corrections and additions to the records were documented. Comprehensive physical exam was also performed. Additionally a clinical summary was entered for each active diagnosis pertinent to this admission in the Problem List to enhance continuity of care.  HPI: The patient was hospitalized 9/29-10/11/2018 for unilateral primary osteoarthritis of the right hip.  Total hip replacement completed 9/29 by Dr Ninfa Linden. Peri-and postoperatively sequential compression devices were applied along with 81 mg of chewable aspirin to prevent DVT.  Past medical and surgical history: Includes gout, dyslipidemia, sleep apnea,BPH, GERD with history of peptic ulcer disease, essential hypertension, lumbar radiculopathy, diverticulosis, and aortic stenosis.  He has also had shoulder surgery, right knee replacement, and esophago-gastroscopy.  Social history: Social alcohol; former smoker having quit in 1961.  He states  he smoked for only 6 months while he was in the [employer].  Family history: Reviewed, noncontributory in the context of his age of 28.   Review of systems: Staff reports that he complains of dysuria and urine is dark and resembles "orange Gatorade".  He was described as "shivering and shaky".  Temperature up to 100.6 was noted.  Also he was noted to have a rash over the left lower back above the buttocks. He describes intermittent difficulty starting the stream with a much weaker stream since surgery.  Staff states that he is taking minimal amounts of opioids. He describes "sciatica" along the left lateral leg, not  the posterior leg.  He believes this was caused by sitting in the lounge chair with his feet up.  He also has noted some tingling in the left leg but also pain and burning.  Change in position does help.  He denies incontinence of urine or stool. In fact he states he did not have a bowel movement over the last 6 days until yesterday following a suppository. Initially he denied anxiety or depression but expresses "I'm not doing as well as I was".  He went on to say "I am 86 so it does not matter". He denies any symptoms of COVID-19.  Constitutional: No fever, significant weight change Eyes: No redness, discharge, pain, vision change ENT/mouth: No nasal congestion, purulent discharge, earache, change in hearing, sore throat  Cardiovascular: No chest pain, palpitations, paroxysmal nocturnal dyspnea, claudication  Respiratory: No cough, sputum production, hemoptysis, DOE, significant snoring, apnea Gastrointestinal: No heartburn, dysphagia, abdominal pain, nausea /vomiting, rectal bleeding, melena, change in bowels Genitourinary: No  hematuria, pyuria, incontinence Dermatologic: No rash, pruritus, change in appearance of skin Neurologic: No dizziness, headache, syncope, seizures Psychiatric: No  insomnia, anorexia Endocrine: No change in hair/skin/nails, excessive thirst, excessive hunger, excessive urination  Hematologic/lymphatic: No significant bruising, lymphadenopathy, abnormal bleeding Allergy/immunology: No itchy/watery eyes, significant sneezing, urticaria, angioedema  Physical exam:  Pertinent or positive findings: He has pattern alopecia.  Slight ptosis is present.  Heart rhythm is slightly irregular.  There is a grade 1 honking systolic murmur at the right base without carotid radiation.  Abdomen is protuberant.  He has 1/2+ pitting edema at the sock line.  Pedal pulses are decreased ,again also slightly irregular. He has flat minimally erythematous punctate lesions over the buttocks,  greater on the left than the right.  There is no vesicle formation or frank cellulitis.  General appearance: Adequately nourished; no acute distress, increased work of breathing is present.   Lymphatic: No lymphadenopathy about the head, neck, axilla. Eyes: No conjunctival inflammation or lid edema is present. There is no scleral icterus. Ears:  External ear exam shows no significant lesions or deformities.   Nose:  External nasal examination shows no deformity or inflammation. Nasal mucosa are pink and moist without lesions, exudates Oral exam: Lips and gums are healthy appearing.There is no oropharyngeal erythema or exudate. Neck:  No thyromegaly, masses, tenderness noted.    Heart:  No gallop, click, rub.  Lungs: Chest clear to auscultation without wheezes, rhonchi, rales, rubs. Abdomen: Bowel sounds are normal.  Abdomen is soft and nontender with no organomegaly, hernias, masses. GU: Deferred  Extremities:  No cyanosis, clubbing. Neurologic exam:  Balance, Rhomberg, finger to nose testing could not be completed due to clinical state Skin: Warm & dry w/o tenting.  See clinical summary under each active problem in the Problem List with associated updated therapeutic plan

## 2019-08-19 NOTE — Assessment & Plan Note (Signed)
Consider low-dose Cymbalta if symptoms progress.  This agent might help neuropathic symptoms as well.

## 2019-08-19 NOTE — Assessment & Plan Note (Signed)
PT/OT in SNF

## 2019-08-19 NOTE — Assessment & Plan Note (Addendum)
D/C opioids as this predisposes him to bladder dysfunction and UTI Continue dual BPH therapy ordered by his Urologist

## 2019-08-19 NOTE — Patient Instructions (Signed)
See assessment and plan under each diagnosis in the problem list and acutely for this visit 

## 2019-08-20 DIAGNOSIS — M62551 Muscle wasting and atrophy, not elsewhere classified, right thigh: Secondary | ICD-10-CM | POA: Diagnosis not present

## 2019-08-20 DIAGNOSIS — M6389 Disorders of muscle in diseases classified elsewhere, multiple sites: Secondary | ICD-10-CM | POA: Diagnosis not present

## 2019-08-20 DIAGNOSIS — R509 Fever, unspecified: Secondary | ICD-10-CM | POA: Diagnosis not present

## 2019-08-20 DIAGNOSIS — N390 Urinary tract infection, site not specified: Secondary | ICD-10-CM | POA: Diagnosis not present

## 2019-08-20 DIAGNOSIS — M25551 Pain in right hip: Secondary | ICD-10-CM | POA: Diagnosis not present

## 2019-08-20 DIAGNOSIS — M1611 Unilateral primary osteoarthritis, right hip: Secondary | ICD-10-CM | POA: Diagnosis not present

## 2019-08-20 DIAGNOSIS — R2689 Other abnormalities of gait and mobility: Secondary | ICD-10-CM | POA: Diagnosis not present

## 2019-08-20 DIAGNOSIS — R278 Other lack of coordination: Secondary | ICD-10-CM | POA: Diagnosis not present

## 2019-08-20 DIAGNOSIS — R300 Dysuria: Secondary | ICD-10-CM | POA: Diagnosis not present

## 2019-08-21 DIAGNOSIS — M25551 Pain in right hip: Secondary | ICD-10-CM | POA: Diagnosis not present

## 2019-08-21 DIAGNOSIS — R278 Other lack of coordination: Secondary | ICD-10-CM | POA: Diagnosis not present

## 2019-08-21 DIAGNOSIS — M62551 Muscle wasting and atrophy, not elsewhere classified, right thigh: Secondary | ICD-10-CM | POA: Diagnosis not present

## 2019-08-21 DIAGNOSIS — M1611 Unilateral primary osteoarthritis, right hip: Secondary | ICD-10-CM | POA: Diagnosis not present

## 2019-08-21 DIAGNOSIS — M6389 Disorders of muscle in diseases classified elsewhere, multiple sites: Secondary | ICD-10-CM | POA: Diagnosis not present

## 2019-08-21 DIAGNOSIS — R2689 Other abnormalities of gait and mobility: Secondary | ICD-10-CM | POA: Diagnosis not present

## 2019-08-22 ENCOUNTER — Telehealth: Payer: Self-pay | Admitting: *Deleted

## 2019-08-22 DIAGNOSIS — R278 Other lack of coordination: Secondary | ICD-10-CM | POA: Diagnosis not present

## 2019-08-22 DIAGNOSIS — M6389 Disorders of muscle in diseases classified elsewhere, multiple sites: Secondary | ICD-10-CM | POA: Diagnosis not present

## 2019-08-22 DIAGNOSIS — Z20828 Contact with and (suspected) exposure to other viral communicable diseases: Secondary | ICD-10-CM | POA: Diagnosis not present

## 2019-08-22 DIAGNOSIS — M62551 Muscle wasting and atrophy, not elsewhere classified, right thigh: Secondary | ICD-10-CM | POA: Diagnosis not present

## 2019-08-22 DIAGNOSIS — R2689 Other abnormalities of gait and mobility: Secondary | ICD-10-CM | POA: Diagnosis not present

## 2019-08-22 DIAGNOSIS — M25551 Pain in right hip: Secondary | ICD-10-CM | POA: Diagnosis not present

## 2019-08-22 DIAGNOSIS — M1611 Unilateral primary osteoarthritis, right hip: Secondary | ICD-10-CM | POA: Diagnosis not present

## 2019-08-22 NOTE — Care Plan (Signed)
RNCM call to patient to check status 1 week post-surgery. Patient currently is at [facility] and reports he is doing well. He verbalized he has made slow progress due to a UTI that was diagnosed after being admitted to [facility]. He reports he is currently on an antibiotic and is doing much better. Reminded of his appointment with Dr. Ninfa Linden next week. Patient states he most likely will still be in [facility] until after being seen by MD.

## 2019-08-22 NOTE — Telephone Encounter (Signed)
7 day Ortho bundle call completed. 

## 2019-08-23 DIAGNOSIS — M25551 Pain in right hip: Secondary | ICD-10-CM | POA: Diagnosis not present

## 2019-08-23 DIAGNOSIS — R278 Other lack of coordination: Secondary | ICD-10-CM | POA: Diagnosis not present

## 2019-08-23 DIAGNOSIS — M6389 Disorders of muscle in diseases classified elsewhere, multiple sites: Secondary | ICD-10-CM | POA: Diagnosis not present

## 2019-08-23 DIAGNOSIS — M62551 Muscle wasting and atrophy, not elsewhere classified, right thigh: Secondary | ICD-10-CM | POA: Diagnosis not present

## 2019-08-23 DIAGNOSIS — R2689 Other abnormalities of gait and mobility: Secondary | ICD-10-CM | POA: Diagnosis not present

## 2019-08-23 DIAGNOSIS — M1611 Unilateral primary osteoarthritis, right hip: Secondary | ICD-10-CM | POA: Diagnosis not present

## 2019-08-26 DIAGNOSIS — M6389 Disorders of muscle in diseases classified elsewhere, multiple sites: Secondary | ICD-10-CM | POA: Diagnosis not present

## 2019-08-26 DIAGNOSIS — R2689 Other abnormalities of gait and mobility: Secondary | ICD-10-CM | POA: Diagnosis not present

## 2019-08-26 DIAGNOSIS — M1611 Unilateral primary osteoarthritis, right hip: Secondary | ICD-10-CM | POA: Diagnosis not present

## 2019-08-26 DIAGNOSIS — R278 Other lack of coordination: Secondary | ICD-10-CM | POA: Diagnosis not present

## 2019-08-26 DIAGNOSIS — M62551 Muscle wasting and atrophy, not elsewhere classified, right thigh: Secondary | ICD-10-CM | POA: Diagnosis not present

## 2019-08-26 DIAGNOSIS — M25551 Pain in right hip: Secondary | ICD-10-CM | POA: Diagnosis not present

## 2019-08-27 ENCOUNTER — Telehealth: Payer: Self-pay | Admitting: *Deleted

## 2019-08-27 ENCOUNTER — Encounter: Payer: Self-pay | Admitting: Orthopaedic Surgery

## 2019-08-27 ENCOUNTER — Other Ambulatory Visit: Payer: Self-pay

## 2019-08-27 ENCOUNTER — Ambulatory Visit (INDEPENDENT_AMBULATORY_CARE_PROVIDER_SITE_OTHER): Payer: Medicare Other | Admitting: Orthopaedic Surgery

## 2019-08-27 DIAGNOSIS — M25551 Pain in right hip: Secondary | ICD-10-CM | POA: Diagnosis not present

## 2019-08-27 DIAGNOSIS — M6389 Disorders of muscle in diseases classified elsewhere, multiple sites: Secondary | ICD-10-CM | POA: Diagnosis not present

## 2019-08-27 DIAGNOSIS — Z96641 Presence of right artificial hip joint: Secondary | ICD-10-CM

## 2019-08-27 DIAGNOSIS — M62551 Muscle wasting and atrophy, not elsewhere classified, right thigh: Secondary | ICD-10-CM | POA: Diagnosis not present

## 2019-08-27 DIAGNOSIS — R2689 Other abnormalities of gait and mobility: Secondary | ICD-10-CM | POA: Diagnosis not present

## 2019-08-27 DIAGNOSIS — M1611 Unilateral primary osteoarthritis, right hip: Secondary | ICD-10-CM | POA: Diagnosis not present

## 2019-08-27 DIAGNOSIS — R278 Other lack of coordination: Secondary | ICD-10-CM | POA: Diagnosis not present

## 2019-08-27 NOTE — Care Plan (Signed)
RNCM met with patient briefly while in the office today for a 2 week post-op with Dr. Ninfa Linden. He verbalized he is doing very well overall. Ambulating with FWW. Reports he will be ready for discharge by end of week this week. Dr. Ninfa Linden wrote in his progress note today that he will give the patient clearance on 09/15/19 to return to use of the pool as he did this previously prior to surgery. Ok to return back to his independent living at [facility] this week. RNCM left VM for Butch Penny, SW with [facility] to update on appointment today. Requested call back with any questions or concerns.

## 2019-08-27 NOTE — Progress Notes (Signed)
The patient is 2 weeks today status post a right total hip arthroplasty.  He stays at [facility] in the independent living but is staying in skilled nursing right now as he rehabilitates his right hip.  He has no issues since his hip surgery.  He is ambulate with a walker.  He says he will transition back to independent living later this week.  On examination his leg lengths are equal.  His right hip incision looks good to remove the staples in place Steri-Strips.  We talked to him about wound care and when he can shower.  He is someone who does like to get in the pool to walk so I told him that we will give him a note saying that he can do this starting November 1.  All question concerns were answered and addressed.  We will see him back in 4 weeks to see how he is doing overall from a mobility standpoint but no x-rays are needed.  All question concerns were answered and addressed.

## 2019-08-27 NOTE — Telephone Encounter (Signed)
14 day Ortho bundle call completed.  

## 2019-08-28 ENCOUNTER — Telehealth: Payer: Self-pay | Admitting: Orthopaedic Surgery

## 2019-08-28 DIAGNOSIS — Z9189 Other specified personal risk factors, not elsewhere classified: Secondary | ICD-10-CM | POA: Diagnosis not present

## 2019-08-28 DIAGNOSIS — M25551 Pain in right hip: Secondary | ICD-10-CM | POA: Diagnosis not present

## 2019-08-28 DIAGNOSIS — M62551 Muscle wasting and atrophy, not elsewhere classified, right thigh: Secondary | ICD-10-CM | POA: Diagnosis not present

## 2019-08-28 DIAGNOSIS — M6389 Disorders of muscle in diseases classified elsewhere, multiple sites: Secondary | ICD-10-CM | POA: Diagnosis not present

## 2019-08-28 DIAGNOSIS — R2689 Other abnormalities of gait and mobility: Secondary | ICD-10-CM | POA: Diagnosis not present

## 2019-08-28 DIAGNOSIS — R278 Other lack of coordination: Secondary | ICD-10-CM | POA: Diagnosis not present

## 2019-08-28 DIAGNOSIS — M1611 Unilateral primary osteoarthritis, right hip: Secondary | ICD-10-CM | POA: Diagnosis not present

## 2019-08-28 NOTE — Telephone Encounter (Signed)
Please advise, ok to drive?

## 2019-08-28 NOTE — Telephone Encounter (Signed)
Patient called. Would like to know when he will be able to drive. His call back number is [phone]

## 2019-08-28 NOTE — Telephone Encounter (Signed)
Patient aware of the below message  

## 2019-08-28 NOTE — Telephone Encounter (Signed)
He is cleared to drive.

## 2019-08-29 ENCOUNTER — Non-Acute Institutional Stay (SKILLED_NURSING_FACILITY): Payer: Medicare Other | Admitting: Adult Health

## 2019-08-29 ENCOUNTER — Encounter: Payer: Self-pay | Admitting: Adult Health

## 2019-08-29 DIAGNOSIS — M1611 Unilateral primary osteoarthritis, right hip: Secondary | ICD-10-CM | POA: Diagnosis not present

## 2019-08-29 DIAGNOSIS — R350 Frequency of micturition: Secondary | ICD-10-CM

## 2019-08-29 DIAGNOSIS — N401 Enlarged prostate with lower urinary tract symptoms: Secondary | ICD-10-CM

## 2019-08-29 DIAGNOSIS — M792 Neuralgia and neuritis, unspecified: Secondary | ICD-10-CM

## 2019-08-29 DIAGNOSIS — I10 Essential (primary) hypertension: Secondary | ICD-10-CM

## 2019-08-29 DIAGNOSIS — Z96641 Presence of right artificial hip joint: Secondary | ICD-10-CM | POA: Diagnosis not present

## 2019-08-29 NOTE — Progress Notes (Signed)
Location:  Occupational psychologist of Service:  SNF (31)  Provider:  Cindi Carbon, ANP [facility] [phone]   PCP: Gayland Curry, DO Patient Care Team: Gayland Curry, DO as PCP - General (Geriatric Medicine) Burnell Blanks, MD as PCP - Cardiology (Cardiology) Mcarthur Rossetti, MD as Consulting Physician (Orthopedic Surgery)  Extended Emergency Contact Information Primary Emergency Contact: Nicki Guadalajara Address: [address]          [place] [address] Johnnette Litter of [place] Phone: [phone] Mobile Phone: [phone] Relation: Spouse  Code Status: Full code Goals of care:  Advanced Directive information Advanced Directives 08/06/2019  Does Patient Have a Medical Advance Directive? Yes  Type of Paramedic of [place];Living will  Does patient want to make changes to medical advance directive? No - Patient declined  Copy of [place] in Chart? No - copy requested     No Known Allergies  Chief Complaint  Patient presents with  . Discharge Note    HPI:  85 y.o. male seen for discharge from [facility] rehab after a hospitalization for a right hip replacement due to primary OA (08/13/19-08/15/2019).  He was admitted for therapy and has progressed with PT and OT. They have cleared [place] to [place]. He is walking with a walker. He denies pain at this time to the hip. He has some neuropathic pain and is using prn neurontin for that, which is not a new finding but worse since his surgery. He was seen by ortho on 10/13 with routine healing noted.    He reports regular bowel movements. He is eating and drinking well with no nausea or vomiting. He has compression hose and taking an asa 81 mg bid for DVT prophylaxis. During his stay at [facility] he had some urgency and a UA was sent and he was started on amoxicillin but later discontinued as the urine showed  no growth. He has chronic urgency that is unchanged due to BPH at this time.      Past Medical History:  Diagnosis Date  . Aortic stenosis   . Arthritis   . BPH (benign prostatic hyperplasia)   . Chronic low back pain   . Depression   . Diverticulitis   . Elevated PSA   . Foot drop, right   . Gait disorder 02/03/2015  . Hip pain   . History of colonoscopy   . HOH (hard of hearing)    hearing aid  . Hypertension   . Lumbar radiculopathy   . Mitral valve prolapse   . Onychomycosis   . Peptic ulcer disease   . Sepsis ([address]) 2005   e. coli-after prostate bx  . Sleep apnea   . Torn ACL   . Tracheobronchomalacia    seen on 2019 Chest CT    Past Surgical History:  Procedure Laterality Date  . CATARACT EXTRACTION Bilateral   . ESOPHAGOGASTRODUODENOSCOPY N/A 08/19/2015   Procedure: ESOPHAGOGASTRODUODENOSCOPY (EGD);  Surgeon: Laurence Spates, MD;  Location: [facility] ENDOSCOPY;  Service: Endoscopy;  Laterality: N/A;  . right knee replacement Right 1996  . SHOULDER SURGERY    . TONSILLECTOMY    . TOTAL HIP ARTHROPLASTY Right 08/13/2019   Procedure: RIGHT TOTAL HIP ARTHROPLASTY ANTERIOR APPROACH;  Surgeon: Mcarthur Rossetti, MD;  Location: [facility];  Service: Orthopedics;  Laterality: Right;      reports that he quit smoking about 59 years ago. His smoking  use included cigarettes. He started smoking about 60 years ago. He smoked 0.50 packs per day. He has never used smokeless tobacco. He reports that he does not use drugs. No history on file for alcohol. Social History   Socioeconomic History  . Marital status: Married    Spouse name: Not on file  . Number of children: 2  . Years of education: Not on file  . Highest education level: Not on file  Occupational History  . Occupation: Retired Social research officer, government  Social Needs  . Financial resource strain: Not on file  . Food insecurity    Worry: Not on file    Inability: Not on file  . Transportation needs    Medical: Not on file     Non-medical: Not on file  Tobacco Use  . Smoking status: Former Smoker    Packs/day: 0.50    Types: Cigarettes    Start date: 1960    Quit date: 1961    Years since quitting: 59.8  . Smokeless tobacco: Never Used  . 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 Alcohol use: Not on file  . Drug use: No  . Sexual activity: Yes  Lifestyle  . Physical activity    Days per week: Not on file    Minutes per session: Not on file  . Stress: Not on file  Relationships  . Social Herbalist on phone: Not on file    Gets together: Not on file    Attends religious service: Not on file    Active member of club or organization: Not on file    Attends meetings of clubs or organizations: Not on file    Relationship status: Not on file  . Intimate partner violence    Fear of current or ex partner: Not on file    Emotionally abused: Not on file    Physically abused: Not on file    Forced sexual activity: Not on file  Other Topics Concern  . Not on file  Social History Narrative   Patient is right handed.   Patient drinks 1 cup of coffee per day.   Functional Status Survey:    No Known Allergies  Pertinent  Health Maintenance Due  Topic Date Due  . INFLUENZA VACCINE  06/15/2019  . PNA vac Low Risk Adult  Completed    Medications: Outpatient Encounter Medications as of 08/29/2019  Medication Sig  . allopurinol (ZYLOPRIM) 100 MG tablet TAKE 1 TABLET ONCE DAILY.  [name] aspirin 81 MG chewable tablet Chew 1 tablet (81 mg total) by mouth 2 (two) times daily.  [name] atorvastatin (LIPITOR) 10 MG tablet Take 10 mg by mouth daily.   . bisacodyl (DULCOLAX) 10 MG suppository Place 10 mg rectally 2 (two) times daily as needed for moderate constipation.  . colchicine 0.6 MG tablet Take 1 tablet (0.6 mg total) by mouth 2 (two) times daily as needed. (Patient taking differently: Take 0.6 mg by mouth 2 (two) times daily as needed (gout). )  . dutasteride  (AVODART) 0.5 MG capsule Take 0.5 mg by mouth daily.  [name] esomeprazole (NEXIUM) 40 MG capsule Take 1 capsule (40 mg total) by mouth 2 (two) times daily before a meal. (Patient taking differently: Take 40 mg by mouth 2 (two) times daily before a meal. )  . gabapentin (NEURONTIN) 100 MG capsule Take 2 capsules (200 mg total) by mouth at bedtime. (Patient taking differently: Take 200  mg by mouth at bedtime. And 200 mg q 8 prn pain)  . indapamide (LOZOL) 2.5 MG tablet Take 2.5 mg by mouth daily.  . mirtazapine (REMERON) 30 MG tablet TAKE ONE TABLET AT BEDTIME. (Patient taking differently: Take 30 mg by mouth at bedtime. )  . Multiple Vitamin (MULTIVITAMIN) tablet Take 1 tablet by mouth daily.  . Omega-3 Fatty Acids (FISH OIL PO) Take 1,000 mg by mouth daily.  . Probiotic Product (PROBIOTIC DAILY PO) Take 1 tablet by mouth daily.  [name] Respiratory Therapy Supplies (FLUTTER) DEVI Use the flutter valve 10 times when doing it daily.  . tamsulosin (FLOMAX) 0.4 MG CAPS capsule Take 0.4 mg by mouth daily.  . Tiotropium Bromide-Olodaterol (STIOLTO RESPIMAT) 2.5-2.5 MCG/ACT AERS Inhale 2 puffs into the lungs daily. (Patient taking differently: Inhale 2 puffs into the lungs daily as needed (shortness of breath). )  . Turmeric 500 MG CAPS Take 500 mg by mouth daily.  . verapamil (CALAN-SR) 240 MG CR tablet Take 240 mg by mouth daily.  . Vitamin D, Ergocalciferol, (DRISDOL) 1.25 MG (50000 UT) CAPS capsule TAKE 1 CAPSULE WEEKLY. (Patient taking differently: Take 50,000 Units by mouth every 7 (seven) days. )  . [DISCONTINUED] methocarbamol (ROBAXIN) 500 MG tablet Take 1 tablet (500 mg total) by mouth every 6 (six) hours as needed for muscle spasms. (Patient taking differently: Take 500 mg by mouth every 8 (eight) hours as needed for muscle spasms. )  . [DISCONTINUED] oxyCODONE (OXY IR/ROXICODONE) 5 MG immediate release tablet Take 1-2 tablets (5-10 mg total) by mouth every 4 (four) hours as needed for moderate pain (pain  score 4-6).  . [DISCONTINUED] traMADol (ULTRAM) 50 MG tablet Take by mouth every 6 (six) hours as needed.   No facility-administered encounter medications on file as of 08/29/2019.     Review of Systems  Constitutional: Positive for activity change. Negative for appetite change, chills, diaphoresis, fatigue, fever and unexpected weight change.  Respiratory: Negative for cough, shortness of breath, wheezing and stridor.   Cardiovascular: Positive for leg swelling. Negative for chest pain and palpitations.  Gastrointestinal: Negative for abdominal distention, abdominal pain, constipation and diarrhea.  Genitourinary: Negative for difficulty urinating and dysuria.  Musculoskeletal: Positive for gait problem. Negative for arthralgias, back pain, joint swelling and myalgias.  Skin: Positive for wound.  Neurological: Positive for numbness. Negative for dizziness, seizures, syncope, facial asymmetry, speech difficulty, weakness and headaches.  Hematological: Negative for adenopathy. Does not bruise/bleed easily.  Psychiatric/Behavioral: Negative for agitation, behavioral problems and confusion.    There were no vitals filed for this visit. There is no height or weight on file to calculate BMI. Physical Exam Constitutional:      General: He is not in acute distress.    Appearance: He is not diaphoretic.  HENT:     Head: Normocephalic and atraumatic.  Neck:     Thyroid: No thyromegaly.     Vascular: No JVD.     Trachea: No tracheal deviation.  Cardiovascular:     Rate and Rhythm: Normal rate and regular rhythm.     Heart sounds: Murmur present.  Pulmonary:     Effort: Pulmonary effort is normal. No respiratory distress.     Breath sounds: Normal breath sounds. No wheezing.  Abdominal:     General: Bowel sounds are normal. There is no distension.     Palpations: Abdomen is soft.     Tenderness: There is no abdominal tenderness.  Musculoskeletal:     Right lower leg: Edema (+  1 with no  erythema, not tender, not red or warm. Neg homans) present.     Left lower leg: No edema.  Lymphadenopathy:     Cervical: No cervical adenopathy.  Skin:    General: Skin is warm and dry.  Neurological:     Mental Status: He is alert and oriented to person, place, and time.     Cranial Nerves: No cranial nerve deficit.  Psychiatric:        Mood and Affect: Mood normal.     Labs reviewed: Basic Metabolic Panel: Recent Labs    08/06/19 0849 08/14/19 0202  NA 139 132*  K 3.8 4.0  CL 100 94*  CO2 27 28  GLUCOSE 112* 144*  BUN 18 17  CREATININE 0.85 0.88  CALCIUM 9.3 8.4*   Liver Function Tests: No results for input(s): AST, ALT, ALKPHOS, BILITOT, PROT, ALBUMIN in the last 8760 hours. No results for input(s): LIPASE, AMYLASE in the last 8760 hours. No results for input(s): AMMONIA in the last 8760 hours. CBC: Recent Labs    08/06/19 0849 08/14/19 0202  WBC 6.2 9.9  HGB 15.0 12.9*  HCT 44.0 35.9*  MCV 98.4 94.2  PLT 228 188   Cardiac Enzymes: No results for input(s): CKTOTAL, CKMB, CKMBINDEX, TROPONINI in the last 8760 hours. BNP: Invalid input(s): POCBNP CBG: No results for input(s): GLUCAP in the last 8760 hours.  Procedures and Imaging Studies During Stay: Dg Pelvis Portable  Result Date: 08/13/2019 CLINICAL DATA:  Postoperative total hip replacement EXAM: PORTABLE PELVIS 1-2 VIEWS COMPARISON:  Intraoperative films of August 13, 2019 FINDINGS: Surgical staples overlie the right hip. No change in appearance of right hip arthroplasty. Femoral head is located. No signs of fracture or complication. Degenerative changes again noted in the left hip. Degenerative changes also noted in the lumbar spine. IMPRESSION: Status post right total hip arthroplasty without complicating features. Degenerative changes in the left hip. Electronically Signed   By: Zetta Bills M.D.   On: 08/13/2019 16:26   Dg C-arm 1-60 Min  Result Date: 08/13/2019 CLINICAL DATA:  Status post  right hip arthroplasty. EXAM: DG C-ARM 1-60 MIN; OPERATIVE RIGHT HIP WITH PELVIS CONTRAST:  None. FLUOROSCOPY TIME:  Radiation Exposure Index (if provided by the fluoroscopic device): 2.9723 mGy. COMPARISON:  Radiographs of June 12, 2019. FINDINGS: Three intraoperative fluoroscopic images were obtained of the right hip. The right femoral and acetabular components appear to be well situated. IMPRESSION: Fluoroscopic guidance provided during right total hip arthroplasty. Electronically Signed   By: Marijo Conception M.D.   On: 08/13/2019 15:47   Dg Hip Operative Unilat With Pelvis Right  Result Date: 08/13/2019 CLINICAL DATA:  Status post right hip arthroplasty. EXAM: DG C-ARM 1-60 MIN; OPERATIVE RIGHT HIP WITH PELVIS CONTRAST:  None. FLUOROSCOPY TIME:  Radiation Exposure Index (if provided by the fluoroscopic device): 2.9723 mGy. COMPARISON:  Radiographs of June 12, 2019. FINDINGS: Three intraoperative fluoroscopic images were obtained of the right hip. The right femoral and acetabular components appear to be well situated. IMPRESSION: Fluoroscopic guidance provided during right total hip arthroplasty. Electronically Signed   By: Marijo Conception M.D.   On: 08/13/2019 15:47    Assessment/Plan:    1. Unilateral primary osteoarthritis, right hip Led to #2  2. Status post total replacement of right hip Discontinued tramadol and robaxin as the pt is not using these. He will continue on neurontin 200 mg qhs and 200 mg q 8 prn for pain.  F/U with ortho  in 4 weeks. F/U with PCP as indicated Recommend completion of asa 81 mg bid per Dr. Ninfa Linden x 20 days then return to 1 tab qd Continue compression hose and PT and OT [place]  3. Essential hypertension Controlled  4. Benign prostatic hyperplasia with urinary frequency Controlled with flomax and avodart.   5. Neuropathic pain Controlled with neurontin.      Patient is being discharged with the following home health services:  PT and OT   Patient is being discharged with the following durable medical equipment:   Has walker  Patient has been advised to f/u with their PCP in 1-2 weeks to for a transitions of care visit.  Social services at their facility was responsible for arranging this appointment.  Pt was provided with adequate prescriptions of noncontrolled medications to reach the scheduled appointment .  For controlled substances, a limited supply was provided as appropriate for the individual patient.  If the pt normally receives these medications from a pain clinic or has a contract with another physician, these medications should be received from that clinic or physician only).    Future labs/tests needed:   NA

## 2019-08-30 DIAGNOSIS — M25551 Pain in right hip: Secondary | ICD-10-CM | POA: Diagnosis not present

## 2019-08-30 DIAGNOSIS — R278 Other lack of coordination: Secondary | ICD-10-CM | POA: Diagnosis not present

## 2019-08-30 DIAGNOSIS — M62551 Muscle wasting and atrophy, not elsewhere classified, right thigh: Secondary | ICD-10-CM | POA: Diagnosis not present

## 2019-08-30 DIAGNOSIS — M1611 Unilateral primary osteoarthritis, right hip: Secondary | ICD-10-CM | POA: Diagnosis not present

## 2019-08-30 DIAGNOSIS — M6389 Disorders of muscle in diseases classified elsewhere, multiple sites: Secondary | ICD-10-CM | POA: Diagnosis not present

## 2019-08-30 DIAGNOSIS — R2689 Other abnormalities of gait and mobility: Secondary | ICD-10-CM | POA: Diagnosis not present

## 2019-09-02 DIAGNOSIS — M62551 Muscle wasting and atrophy, not elsewhere classified, right thigh: Secondary | ICD-10-CM | POA: Diagnosis not present

## 2019-09-02 DIAGNOSIS — R278 Other lack of coordination: Secondary | ICD-10-CM | POA: Diagnosis not present

## 2019-09-02 DIAGNOSIS — R2689 Other abnormalities of gait and mobility: Secondary | ICD-10-CM | POA: Diagnosis not present

## 2019-09-02 DIAGNOSIS — M1611 Unilateral primary osteoarthritis, right hip: Secondary | ICD-10-CM | POA: Diagnosis not present

## 2019-09-02 DIAGNOSIS — M6389 Disorders of muscle in diseases classified elsewhere, multiple sites: Secondary | ICD-10-CM | POA: Diagnosis not present

## 2019-09-02 DIAGNOSIS — M25551 Pain in right hip: Secondary | ICD-10-CM | POA: Diagnosis not present

## 2019-09-04 DIAGNOSIS — M25551 Pain in right hip: Secondary | ICD-10-CM | POA: Diagnosis not present

## 2019-09-04 DIAGNOSIS — M62551 Muscle wasting and atrophy, not elsewhere classified, right thigh: Secondary | ICD-10-CM | POA: Diagnosis not present

## 2019-09-04 DIAGNOSIS — M6389 Disorders of muscle in diseases classified elsewhere, multiple sites: Secondary | ICD-10-CM | POA: Diagnosis not present

## 2019-09-04 DIAGNOSIS — M1611 Unilateral primary osteoarthritis, right hip: Secondary | ICD-10-CM | POA: Diagnosis not present

## 2019-09-04 DIAGNOSIS — R278 Other lack of coordination: Secondary | ICD-10-CM | POA: Diagnosis not present

## 2019-09-04 DIAGNOSIS — R2689 Other abnormalities of gait and mobility: Secondary | ICD-10-CM | POA: Diagnosis not present

## 2019-09-06 DIAGNOSIS — M1611 Unilateral primary osteoarthritis, right hip: Secondary | ICD-10-CM | POA: Diagnosis not present

## 2019-09-06 DIAGNOSIS — R2689 Other abnormalities of gait and mobility: Secondary | ICD-10-CM | POA: Diagnosis not present

## 2019-09-06 DIAGNOSIS — M62551 Muscle wasting and atrophy, not elsewhere classified, right thigh: Secondary | ICD-10-CM | POA: Diagnosis not present

## 2019-09-06 DIAGNOSIS — M6389 Disorders of muscle in diseases classified elsewhere, multiple sites: Secondary | ICD-10-CM | POA: Diagnosis not present

## 2019-09-06 DIAGNOSIS — M25551 Pain in right hip: Secondary | ICD-10-CM | POA: Diagnosis not present

## 2019-09-06 DIAGNOSIS — R278 Other lack of coordination: Secondary | ICD-10-CM | POA: Diagnosis not present

## 2019-09-09 DIAGNOSIS — M62551 Muscle wasting and atrophy, not elsewhere classified, right thigh: Secondary | ICD-10-CM | POA: Diagnosis not present

## 2019-09-09 DIAGNOSIS — M25551 Pain in right hip: Secondary | ICD-10-CM | POA: Diagnosis not present

## 2019-09-09 DIAGNOSIS — M1611 Unilateral primary osteoarthritis, right hip: Secondary | ICD-10-CM | POA: Diagnosis not present

## 2019-09-09 DIAGNOSIS — R2689 Other abnormalities of gait and mobility: Secondary | ICD-10-CM | POA: Diagnosis not present

## 2019-09-09 DIAGNOSIS — M6389 Disorders of muscle in diseases classified elsewhere, multiple sites: Secondary | ICD-10-CM | POA: Diagnosis not present

## 2019-09-09 DIAGNOSIS — R278 Other lack of coordination: Secondary | ICD-10-CM | POA: Diagnosis not present

## 2019-09-13 ENCOUNTER — Telehealth: Payer: Self-pay | Admitting: *Deleted

## 2019-09-13 DIAGNOSIS — R2689 Other abnormalities of gait and mobility: Secondary | ICD-10-CM | POA: Diagnosis not present

## 2019-09-13 DIAGNOSIS — M25551 Pain in right hip: Secondary | ICD-10-CM | POA: Diagnosis not present

## 2019-09-13 DIAGNOSIS — R278 Other lack of coordination: Secondary | ICD-10-CM | POA: Diagnosis not present

## 2019-09-13 DIAGNOSIS — M62551 Muscle wasting and atrophy, not elsewhere classified, right thigh: Secondary | ICD-10-CM | POA: Diagnosis not present

## 2019-09-13 DIAGNOSIS — M1611 Unilateral primary osteoarthritis, right hip: Secondary | ICD-10-CM | POA: Diagnosis not present

## 2019-09-13 DIAGNOSIS — M6389 Disorders of muscle in diseases classified elsewhere, multiple sites: Secondary | ICD-10-CM | POA: Diagnosis not present

## 2019-09-13 NOTE — Telephone Encounter (Signed)
30 day Ortho bundle call completed.

## 2019-09-13 NOTE — Care Plan (Signed)
RNCM call to check on patient 30 days post-surgery. Patient verbalized he is doing well with the hip replacement. He is excited about getting back to the pool to exercise next week per Dr. Trevor Mace instructions at last visit. He is ambulating without a cane, but uses it for longer distances. F/U scheduled in office for 09/25/19. Reminded to call for any further needs. Reviewed patient satisfaction survey. 4 question survey reviewed from [facility] in place of 10 question survey in [place]. 1. Before surgery, I was provided sufficient education regarding my surgery and the bundle program. Patient response: strongly agree 2. I was satisfied with the care provided by the nurse at the facility where my surgery was performed. Patient response: strongly agree 3. Following surgery, I received sufficient postoperative care instructions. Patient response: strongly agree 4. I would recommend my surgeon and the bundle program to others. Patient response: strongly agree

## 2019-09-18 ENCOUNTER — Other Ambulatory Visit: Payer: Self-pay | Admitting: Family Medicine

## 2019-09-24 DIAGNOSIS — G629 Polyneuropathy, unspecified: Secondary | ICD-10-CM | POA: Diagnosis not present

## 2019-09-24 DIAGNOSIS — I10 Essential (primary) hypertension: Secondary | ICD-10-CM | POA: Diagnosis not present

## 2019-09-24 DIAGNOSIS — I350 Nonrheumatic aortic (valve) stenosis: Secondary | ICD-10-CM | POA: Diagnosis not present

## 2019-09-24 DIAGNOSIS — I2510 Atherosclerotic heart disease of native coronary artery without angina pectoris: Secondary | ICD-10-CM | POA: Diagnosis not present

## 2019-09-24 DIAGNOSIS — R269 Unspecified abnormalities of gait and mobility: Secondary | ICD-10-CM | POA: Diagnosis not present

## 2019-09-24 DIAGNOSIS — M109 Gout, unspecified: Secondary | ICD-10-CM | POA: Diagnosis not present

## 2019-09-24 DIAGNOSIS — N401 Enlarged prostate with lower urinary tract symptoms: Secondary | ICD-10-CM | POA: Diagnosis not present

## 2019-09-24 DIAGNOSIS — R351 Nocturia: Secondary | ICD-10-CM | POA: Diagnosis not present

## 2019-09-25 ENCOUNTER — Ambulatory Visit (INDEPENDENT_AMBULATORY_CARE_PROVIDER_SITE_OTHER): Payer: Medicare Other | Admitting: Orthopaedic Surgery

## 2019-09-25 ENCOUNTER — Other Ambulatory Visit: Payer: Self-pay

## 2019-09-25 ENCOUNTER — Encounter: Payer: Self-pay | Admitting: Orthopaedic Surgery

## 2019-09-25 DIAGNOSIS — Z20828 Contact with and (suspected) exposure to other viral communicable diseases: Secondary | ICD-10-CM | POA: Diagnosis not present

## 2019-09-25 DIAGNOSIS — Z96641 Presence of right artificial hip joint: Secondary | ICD-10-CM

## 2019-09-25 DIAGNOSIS — Z9189 Other specified personal risk factors, not elsewhere classified: Secondary | ICD-10-CM | POA: Diagnosis not present

## 2019-09-25 NOTE — Progress Notes (Signed)
HPI: [name] returns now 6 weeks status post right total hip arthroplasty.  He states that overall his range of motion and strength are improving.  He still feels a little bit off on his gait balance.  He is ambulating without any assistive device today but states that he has to walk long distances he does use a cane.  Otherwise he is has no complaints.  Physical exam: Right hip excellent range of motion.  He is able to cross his legs.  Calf supple nontender dorsiflexion plantarflexion ankle intact.  Impression: Status post right total hip arthroplasty 08/13/2019  Plan: At this point time we will send in physical therapy to work on gait balance and strengthening lower extremities.  Follow-up with [place] in 4 weeks sooner if there is any questions concerns.  Questions were encouraged and answered

## 2019-09-30 ENCOUNTER — Other Ambulatory Visit: Payer: Self-pay | Admitting: Family Medicine

## 2019-10-01 DIAGNOSIS — L720 Epidermal cyst: Secondary | ICD-10-CM | POA: Diagnosis not present

## 2019-10-01 DIAGNOSIS — L570 Actinic keratosis: Secondary | ICD-10-CM | POA: Diagnosis not present

## 2019-10-01 DIAGNOSIS — D1801 Hemangioma of skin and subcutaneous tissue: Secondary | ICD-10-CM | POA: Diagnosis not present

## 2019-10-01 DIAGNOSIS — L821 Other seborrheic keratosis: Secondary | ICD-10-CM | POA: Diagnosis not present

## 2019-10-01 DIAGNOSIS — L812 Freckles: Secondary | ICD-10-CM | POA: Diagnosis not present

## 2019-10-01 DIAGNOSIS — Z85828 Personal history of other malignant neoplasm of skin: Secondary | ICD-10-CM | POA: Diagnosis not present

## 2019-10-01 DIAGNOSIS — B354 Tinea corporis: Secondary | ICD-10-CM | POA: Diagnosis not present

## 2019-10-01 DIAGNOSIS — D225 Melanocytic nevi of trunk: Secondary | ICD-10-CM | POA: Diagnosis not present

## 2019-10-03 NOTE — Progress Notes (Signed)
[name] [address] [place], [place] [address] Phone: [phone] Subjective:   I [name] am serving as a Education administrator for Dr. Hulan Saas.  This visit occurred during the SARS-CoV-2 public health emergency.  Safety protocols were in place, including screening questions prior to the visit, additional usage of staff PPE, and extensive cleaning of exam room while observing appropriate contact time as indicated for disinfecting solutions.    CC: Right hip pain  TSV:[id number]   06/18/2019 Severe referred to orthopedic surgery  10/04/2019 Patrick Franco is a 85 y.o. male coming in with complaint of right hip pain. Wants to talk about neuropathy. States it is worse since surgery. Believes it is due to decreased activity. Going to PT and working out more. Hip has 0 pain. States he has no stamina and wants to start exercising more. Walks about 30 mins (in the pool) a day and lifts weights for 30 mins. Overall surgery went well. Is hopeful that balance will get better.  Patient feels though is more of a neuropathy that is contributing to why he is unable to increase activity significantly.     Past Medical History:  Diagnosis Date  . Aortic stenosis   . Arthritis   . BPH (benign prostatic hyperplasia)   . Chronic low back pain   . Depression   . Diverticulitis   . Elevated PSA   . Foot drop, right   . Gait disorder 02/03/2015  . Hip pain   . History of colonoscopy   . HOH (hard of hearing)    hearing aid  . Hypertension   . Lumbar radiculopathy   . Mitral valve prolapse   . Onychomycosis   . Peptic ulcer disease   . Sepsis ([place]) 2005   e. coli-after prostate bx  . Sleep apnea   . Torn ACL   . Tracheobronchomalacia    seen on 2019 Chest CT   Past Surgical History:  Procedure Laterality Date  . CATARACT EXTRACTION Bilateral   . ESOPHAGOGASTRODUODENOSCOPY N/A 08/19/2015   Procedure: ESOPHAGOGASTRODUODENOSCOPY (EGD);  Surgeon: Laurence Spates, MD;   Location: [facility] ENDOSCOPY;  Service: Endoscopy;  Laterality: N/A;  . right knee replacement Right 1996  . SHOULDER SURGERY    . TONSILLECTOMY    . TOTAL HIP ARTHROPLASTY Right 08/13/2019   Procedure: RIGHT TOTAL HIP ARTHROPLASTY ANTERIOR APPROACH;  Surgeon: Mcarthur Rossetti, MD;  Location: [facility];  Service: Orthopedics;  Laterality: Right;   Social History   Socioeconomic History  . Marital status: Married    Spouse name: Not on file  . Number of children: 2  . Years of education: Not on file  . Highest education level: Not on file  Occupational History  . Occupation: Retired Social research officer, government  Social Needs  . Financial resource strain: Not on file  . Food insecurity    Worry: Not on file    Inability: Not on file  . Transportation needs    Medical: Not on file    Non-medical: Not on file  Tobacco Use  . Smoking status: Former Smoker    Packs/day: 0.50    Types: Cigarettes    Start date: 1960    Quit date: 1961    Years since quitting: 59.9  . Smokeless tobacco: Never Used  . Tobacco comment: Pt smoked for 26month when in [employer]  Substance and Sexual Activity  . Alcohol use: Not on file  . Drug use: No  . Sexual activity: Yes  Lifestyle  . Physical activity    Days per week: Not on file    Minutes per session: Not on file  . Stress: Not on file  Relationships  . Social Herbalist on phone: Not on file    Gets together: Not on file    Attends religious service: Not on file    Active member of club or organization: Not on file    Attends meetings of clubs or organizations: Not on file    Relationship status: Not on file  Other Topics Concern  . Not on file  Social History Narrative   Patient is right handed.   Patient drinks 1 cup of coffee per day.   No Known Allergies Family History  Problem Relation Age of Onset  . Pneumonia Mother 11  . Cancer - Lung Father 41  . Multiple myeloma Brother 64     Current Outpatient Medications  (Cardiovascular):  .  atorvastatin (LIPITOR) 10 MG tablet, Take 10 mg by mouth daily.  .  indapamide (LOZOL) 2.5 MG tablet, Take 2.5 mg by mouth daily. .  verapamil (CALAN-SR) 240 MG CR tablet, Take 240 mg by mouth daily.  Current Outpatient Medications (Respiratory):  [name]  Tiotropium Bromide-Olodaterol (STIOLTO RESPIMAT) 2.5-2.5 MCG/ACT AERS, Inhale 2 puffs into the lungs daily. (Patient taking differently: Inhale 2 puffs into the lungs daily as needed (shortness of breath). )  Current Outpatient Medications (Analgesics):  .  allopurinol (ZYLOPRIM) 100 MG tablet, TAKE 1 TABLET ONCE DAILY. [name]  aspirin 81 MG chewable tablet, Chew 1 tablet (81 mg total) by mouth 2 (two) times daily. .  colchicine 0.6 MG tablet, Take 1 tablet (0.6 mg total) by mouth 2 (two) times daily as needed. (Patient taking differently: Take 0.6 mg by mouth 2 (two) times daily as needed (gout). )   Current Outpatient Medications (Other):  .  bisacodyl (DULCOLAX) 10 MG suppository, Place 10 mg rectally 2 (two) times daily as needed for moderate constipation. .  dutasteride (AVODART) 0.5 MG capsule, Take 0.5 mg by mouth daily. [name]  esomeprazole (NEXIUM) 40 MG capsule, Take 1 capsule (40 mg total) by mouth 2 (two) times daily before a meal. (Patient taking differently: Take 40 mg by mouth 2 (two) times daily before a meal. ) .  gabapentin (NEURONTIN) 100 MG capsule, TAKE 2 CAPSULES AT BEDTIME. .  mirtazapine (REMERON) 30 MG tablet, TAKE ONE TABLET AT BEDTIME. .  Multiple Vitamin (MULTIVITAMIN) tablet, Take 1 tablet by mouth daily. .  Omega-3 Fatty Acids (FISH OIL PO), Take 1,000 mg by mouth daily. .  Probiotic Product (PROBIOTIC DAILY PO), Take 1 tablet by mouth daily. [name]  Respiratory Therapy Supplies (FLUTTER) DEVI, Use the flutter valve 10 times when doing it daily. .  tamsulosin (FLOMAX) 0.4 MG CAPS capsule, Take 0.4 mg by mouth daily. .  Turmeric 500 MG CAPS, Take 500 mg by mouth daily. .  Vitamin D, Ergocalciferol, (DRISDOL)  1.25 MG (50000 UT) CAPS capsule, TAKE 1 CAPSULE WEEKLY. .  gabapentin (NEURONTIN) 300 MG capsule, Take 1 capsule (300 mg total) by mouth 3 (three) times daily.    Past medical history, social, surgical and family history all reviewed in electronic medical record.  No pertanent information unless stated regarding to the chief complaint.   Review of Systems:  No headache, visual changes, nausea, vomiting, diarrhea, constipation, dizziness, abdominal pain, skin rash, fevers, chills, night sweats, weight loss, swollen lymph nodes, body aches, joint swelling, muscle aches, chest pain, shortness  of breath, mood changes.  Mild positive muscle aches  Objective  Blood pressure 130/64, pulse 85, height _0  (1.778 m), weight 212 lb (96.2 kg), SpO2 96 %.    General: No apparent distress alert and oriented x3 mood and affect normal, dressed appropriately.  HEENT: Pupils equal, extraocular movements intact  Respiratory: Patient's speak in full sentences and does not appear short of breath  Cardiovascular: No lower extremity edema, non tender, no erythema  Skin: Warm dry intact with no signs of infection or rash on extremities or on axial skeleton.  Abdomen: Soft nontender  Neuro: Cranial nerves II through XII are intact, neurovascularly intact in all extremities with 2+ DTRs and 2+ pulses.  Lymph: No lymphadenopathy of posterior or anterior cervical chain or axillae bilaterally.  Gait normal with good balance and coordination.  MSK:  tender with full range of motion and good stability and symmetric strength and tone of , elbows, wrist, hip, knee and ankles bilaterally.   Neck exam does have some loss of lordosis.  Some mild degenerative scoliosis noted.  No significant radicular symptoms with straight leg test but tightness of hamstrings.  Seems to have some mild peripheral neuropathy bilaterally at the toes.  4-5 dorsiflexion of the feet may be mildly weaker on the right    Impression and  Recommendations:     This case required medical decision making of moderate complexity. The above documentation has been reviewed and is accurate and complete Lyndal Pulley, DO       Note: This dictation was prepared with Dragon dictation along with smaller phrase technology. Any transcriptional errors that result from this process are unintentional.

## 2019-10-04 ENCOUNTER — Other Ambulatory Visit: Payer: Self-pay

## 2019-10-04 ENCOUNTER — Ambulatory Visit (INDEPENDENT_AMBULATORY_CARE_PROVIDER_SITE_OTHER): Payer: Medicare Other | Admitting: Family Medicine

## 2019-10-04 ENCOUNTER — Encounter: Payer: Self-pay | Admitting: Family Medicine

## 2019-10-04 ENCOUNTER — Other Ambulatory Visit (INDEPENDENT_AMBULATORY_CARE_PROVIDER_SITE_OTHER): Payer: Medicare Other

## 2019-10-04 VITALS — BP 130/64 | HR 85 | Ht 70.0 in | Wt 212.0 lb

## 2019-10-04 DIAGNOSIS — I2510 Atherosclerotic heart disease of native coronary artery without angina pectoris: Secondary | ICD-10-CM

## 2019-10-04 DIAGNOSIS — G629 Polyneuropathy, unspecified: Secondary | ICD-10-CM | POA: Insufficient documentation

## 2019-10-04 DIAGNOSIS — G6289 Other specified polyneuropathies: Secondary | ICD-10-CM

## 2019-10-04 DIAGNOSIS — E538 Deficiency of other specified B group vitamins: Secondary | ICD-10-CM

## 2019-10-04 DIAGNOSIS — M25551 Pain in right hip: Secondary | ICD-10-CM | POA: Diagnosis not present

## 2019-10-04 DIAGNOSIS — M48062 Spinal stenosis, lumbar region with neurogenic claudication: Secondary | ICD-10-CM | POA: Diagnosis not present

## 2019-10-04 DIAGNOSIS — M2550 Pain in unspecified joint: Secondary | ICD-10-CM

## 2019-10-04 DIAGNOSIS — E559 Vitamin D deficiency, unspecified: Secondary | ICD-10-CM

## 2019-10-04 LAB — CBC WITH DIFFERENTIAL/PLATELET
Basophils Absolute: 0 10*3/uL (ref 0.0–0.1)
Basophils Relative: 0.7 % (ref 0.0–3.0)
Eosinophils Absolute: 0.1 10*3/uL (ref 0.0–0.7)
Eosinophils Relative: 1.5 % (ref 0.0–5.0)
HCT: 41.7 % (ref 39.0–52.0)
Hemoglobin: 14 g/dL (ref 13.0–17.0)
Lymphocytes Relative: 24.7 % (ref 12.0–46.0)
Lymphs Abs: 1.7 10*3/uL (ref 0.7–4.0)
MCHC: 33.6 g/dL (ref 30.0–36.0)
MCV: 96.6 fl (ref 78.0–100.0)
Monocytes Absolute: 0.6 10*3/uL (ref 0.1–1.0)
Monocytes Relative: 8.5 % (ref 3.0–12.0)
Neutro Abs: 4.5 10*3/uL (ref 1.4–7.7)
Neutrophils Relative %: 64.6 % (ref 43.0–77.0)
Platelets: 245 10*3/uL (ref 150.0–400.0)
RBC: 4.31 Mil/uL (ref 4.22–5.81)
RDW: 14.6 % (ref 11.5–15.5)
WBC: 7 10*3/uL (ref 4.0–10.5)

## 2019-10-04 LAB — COMPREHENSIVE METABOLIC PANEL
ALT: 20 U/L (ref 0–53)
AST: 19 U/L (ref 0–37)
Albumin: 4.6 g/dL (ref 3.5–5.2)
Alkaline Phosphatase: 72 U/L (ref 39–117)
BUN: 15 mg/dL (ref 6–23)
CO2: 29 mEq/L (ref 19–32)
Calcium: 9.6 mg/dL (ref 8.4–10.5)
Chloride: 99 mEq/L (ref 96–112)
Creatinine, Ser: 0.87 mg/dL (ref 0.40–1.50)
GFR: 83.41 mL/min (ref >60.00)
Glucose, Bld: 110 mg/dL — ABNORMAL HIGH (ref 70–99)
Potassium: 4 mEq/L (ref 3.5–5.1)
Sodium: 138 mEq/L (ref 135–145)
Total Bilirubin: 1.4 mg/dL — ABNORMAL HIGH (ref 0.2–1.2)
Total Protein: 7.1 g/dL (ref 6.0–8.3)

## 2019-10-04 LAB — VITAMIN B12: Vitamin B-12: 908 pg/mL (ref 211–911)

## 2019-10-04 MED ORDER — GABAPENTIN 300 MG PO CAPS: 300.0000 mg | ORAL_CAPSULE | Freq: Three times a day (TID) | ORAL | 3 refills | Status: DC

## 2019-10-04 NOTE — Assessment & Plan Note (Signed)
Patient seems to be having more of a peripheral neuropathy.  Patient will increase the gabapentin to 300 mg and warned of potential side effects.  We discussed icing regimen, discussed topical anti-inflammatories.  We discussed over-the-counter medications and will get laboratory work-up to further evaluate.  Patient is in agreement with the plan.  Follow-up again in 4 to 8 weeks.

## 2019-10-04 NOTE — Patient Instructions (Addendum)
Good to see you Labs downstairs 300 mg gabapentin at night Virtual follow up in 3 weeks

## 2019-10-07 LAB — VITAMIN D 1,25 DIHYDROXY
Vitamin D 1, 25 (OH)2 Total: 53 pg/mL (ref 18–72)
Vitamin D2 1, 25 (OH)2: 39 pg/mL
Vitamin D3 1, 25 (OH)2: 14 pg/mL

## 2019-10-17 DIAGNOSIS — L720 Epidermal cyst: Secondary | ICD-10-CM | POA: Diagnosis not present

## 2019-10-17 DIAGNOSIS — Z85828 Personal history of other malignant neoplasm of skin: Secondary | ICD-10-CM | POA: Diagnosis not present

## 2019-10-22 DIAGNOSIS — M25551 Pain in right hip: Secondary | ICD-10-CM | POA: Diagnosis not present

## 2019-10-24 DIAGNOSIS — M25551 Pain in right hip: Secondary | ICD-10-CM | POA: Diagnosis not present

## 2019-10-25 ENCOUNTER — Encounter: Payer: Self-pay | Admitting: Family Medicine

## 2019-10-25 ENCOUNTER — Ambulatory Visit (INDEPENDENT_AMBULATORY_CARE_PROVIDER_SITE_OTHER): Payer: Medicare Other | Admitting: Family Medicine

## 2019-10-25 DIAGNOSIS — M48062 Spinal stenosis, lumbar region with neurogenic claudication: Secondary | ICD-10-CM

## 2019-10-25 DIAGNOSIS — I2510 Atherosclerotic heart disease of native coronary artery without angina pectoris: Secondary | ICD-10-CM | POA: Diagnosis not present

## 2019-10-25 NOTE — Progress Notes (Signed)
Virtual Visit via Video Note  I connected with Patrick Franco on 10/25/19 at  9:15 AM EST by a video enabled telemedicine application and verified that I am speaking with the correct person using two identifiers.  Location: Patient: Patient is in home setting at this moment. Provider: In office setting   I discussed the limitations of evaluation and management by telemedicine and the availability of in person appointments. The patient expressed understanding and agreed to proceed.  History of Present Illness: 85 year old gentleman who had significant neck pain, increase gabapentin to 300 mg, has been working on a more regular basis and is feeling significantly better.  States that it is 100% better at this time.  Denies any radiation down the legs or any numbness or tingling.    Observations/Objective: Alert and oriented x3,   Assessment and Plan: Patient is a 85 year old gentleman with known degenerative disc disease at multiple levels of the cervical and thoracic spine responding very well to gabapentin 300 mg without any significant side effects.  Continue the same medications and as long as patient is well can follow-up with me as needed   Follow Up Instructions: As needed    I discussed the assessment and treatment plan with the patient. The patient was provided an opportunity to ask questions and all were answered. The patient agreed with the plan and demonstrated an understanding of the instructions.   The patient was advised to call back or seek an in-person evaluation if the symptoms worsen or if the condition fails to improve as anticipated.  I provided 10 minutes of face-to-face time during this encounter.   Lyndal Pulley, DO

## 2019-11-11 ENCOUNTER — Other Ambulatory Visit: Payer: Self-pay | Admitting: Family Medicine

## 2019-11-14 ENCOUNTER — Telehealth: Payer: Self-pay | Admitting: *Deleted

## 2019-11-14 NOTE — Care Plan (Signed)
RNCM call to check status of patient 90 days after Right THA with Dr. Ninfa Linden. He states overall he is doing well. He is attending OPPT for decrease in stamina and balance issues ordered by Dr. Ninfa Linden. He is pleased with the hip post surgery. Cyndee Brightly. Survey completed. Informed patient to contact office or CM if any needs or questions related to the hip.

## 2019-11-14 NOTE — Telephone Encounter (Signed)
90 day Ortho bundle call completed. 

## 2019-11-18 DIAGNOSIS — N401 Enlarged prostate with lower urinary tract symptoms: Secondary | ICD-10-CM | POA: Diagnosis not present

## 2019-11-18 DIAGNOSIS — I2510 Atherosclerotic heart disease of native coronary artery without angina pectoris: Secondary | ICD-10-CM | POA: Diagnosis not present

## 2019-11-18 DIAGNOSIS — J479 Bronchiectasis, uncomplicated: Secondary | ICD-10-CM | POA: Diagnosis not present

## 2019-11-18 DIAGNOSIS — I480 Paroxysmal atrial fibrillation: Secondary | ICD-10-CM | POA: Diagnosis not present

## 2019-11-18 DIAGNOSIS — I10 Essential (primary) hypertension: Secondary | ICD-10-CM | POA: Diagnosis not present

## 2019-11-18 DIAGNOSIS — F329 Major depressive disorder, single episode, unspecified: Secondary | ICD-10-CM | POA: Diagnosis not present

## 2019-11-19 DIAGNOSIS — M25551 Pain in right hip: Secondary | ICD-10-CM | POA: Diagnosis not present

## 2019-11-20 DIAGNOSIS — I10 Essential (primary) hypertension: Secondary | ICD-10-CM | POA: Diagnosis not present

## 2019-11-21 DIAGNOSIS — M25551 Pain in right hip: Secondary | ICD-10-CM | POA: Diagnosis not present

## 2019-11-28 DIAGNOSIS — Z23 Encounter for immunization: Secondary | ICD-10-CM | POA: Diagnosis not present

## 2019-12-03 DIAGNOSIS — M25551 Pain in right hip: Secondary | ICD-10-CM | POA: Diagnosis not present

## 2019-12-05 DIAGNOSIS — M25551 Pain in right hip: Secondary | ICD-10-CM | POA: Diagnosis not present

## 2019-12-10 DIAGNOSIS — M25551 Pain in right hip: Secondary | ICD-10-CM | POA: Diagnosis not present

## 2019-12-12 DIAGNOSIS — M25551 Pain in right hip: Secondary | ICD-10-CM | POA: Diagnosis not present

## 2019-12-17 DIAGNOSIS — M25551 Pain in right hip: Secondary | ICD-10-CM | POA: Diagnosis not present

## 2019-12-19 DIAGNOSIS — M25551 Pain in right hip: Secondary | ICD-10-CM | POA: Diagnosis not present

## 2019-12-24 DIAGNOSIS — Z23 Encounter for immunization: Secondary | ICD-10-CM | POA: Diagnosis not present

## 2019-12-24 DIAGNOSIS — M25551 Pain in right hip: Secondary | ICD-10-CM | POA: Diagnosis not present

## 2019-12-25 DIAGNOSIS — H524 Presbyopia: Secondary | ICD-10-CM | POA: Diagnosis not present

## 2019-12-25 DIAGNOSIS — H04123 Dry eye syndrome of bilateral lacrimal glands: Secondary | ICD-10-CM | POA: Diagnosis not present

## 2019-12-25 DIAGNOSIS — Z9849 Cataract extraction status, unspecified eye: Secondary | ICD-10-CM | POA: Diagnosis not present

## 2019-12-25 DIAGNOSIS — H5201 Hypermetropia, right eye: Secondary | ICD-10-CM | POA: Diagnosis not present

## 2019-12-25 DIAGNOSIS — Z961 Presence of intraocular lens: Secondary | ICD-10-CM | POA: Diagnosis not present

## 2019-12-25 DIAGNOSIS — H52223 Regular astigmatism, bilateral: Secondary | ICD-10-CM | POA: Diagnosis not present

## 2019-12-25 DIAGNOSIS — H3540 Unspecified peripheral retinal degeneration: Secondary | ICD-10-CM | POA: Diagnosis not present

## 2019-12-25 DIAGNOSIS — H0289 Other specified disorders of eyelid: Secondary | ICD-10-CM | POA: Diagnosis not present

## 2019-12-27 DIAGNOSIS — M25551 Pain in right hip: Secondary | ICD-10-CM | POA: Diagnosis not present

## 2019-12-30 ENCOUNTER — Other Ambulatory Visit: Payer: Self-pay | Admitting: Family Medicine

## 2019-12-31 ENCOUNTER — Other Ambulatory Visit: Payer: Self-pay | Admitting: Family Medicine

## 2019-12-31 DIAGNOSIS — M25551 Pain in right hip: Secondary | ICD-10-CM | POA: Diagnosis not present

## 2020-01-07 ENCOUNTER — Other Ambulatory Visit: Payer: Self-pay | Admitting: Family Medicine

## 2020-01-07 DIAGNOSIS — M25551 Pain in right hip: Secondary | ICD-10-CM | POA: Diagnosis not present

## 2020-01-10 DIAGNOSIS — I10 Essential (primary) hypertension: Secondary | ICD-10-CM | POA: Diagnosis not present

## 2020-01-13 DIAGNOSIS — I2510 Atherosclerotic heart disease of native coronary artery without angina pectoris: Secondary | ICD-10-CM | POA: Diagnosis not present

## 2020-01-13 DIAGNOSIS — F329 Major depressive disorder, single episode, unspecified: Secondary | ICD-10-CM | POA: Diagnosis not present

## 2020-01-13 DIAGNOSIS — J479 Bronchiectasis, uncomplicated: Secondary | ICD-10-CM | POA: Diagnosis not present

## 2020-01-13 DIAGNOSIS — I480 Paroxysmal atrial fibrillation: Secondary | ICD-10-CM | POA: Diagnosis not present

## 2020-01-13 DIAGNOSIS — N401 Enlarged prostate with lower urinary tract symptoms: Secondary | ICD-10-CM | POA: Diagnosis not present

## 2020-01-13 DIAGNOSIS — I10 Essential (primary) hypertension: Secondary | ICD-10-CM | POA: Diagnosis not present

## 2020-02-06 DIAGNOSIS — I10 Essential (primary) hypertension: Secondary | ICD-10-CM | POA: Diagnosis not present

## 2020-02-11 ENCOUNTER — Other Ambulatory Visit: Payer: Self-pay | Admitting: Family Medicine

## 2020-02-12 DIAGNOSIS — I10 Essential (primary) hypertension: Secondary | ICD-10-CM | POA: Diagnosis not present

## 2020-02-18 ENCOUNTER — Telehealth: Payer: Self-pay | Admitting: Pulmonary Disease

## 2020-02-18 NOTE — Telephone Encounter (Signed)
Spoke with patient's wife. She stated that the increase in SOB has been going on for the past 2-3 months. He has been following up with his PCP Dr. Lavone Orn who advised him to follow up with [place]. The SOB is only with exertion while walking. He is able to do his water aerobics and exercise bike without any issues.   Both him and his wife are residents at [employer]. They have both received their covid vaccines and have denied being around anyone with covid.   I advised her that it would be ok for him to come in for his appt tomorrow. She verbalized understanding.   Nothing further needed at time of call.

## 2020-02-19 ENCOUNTER — Encounter: Payer: Self-pay | Admitting: Pulmonary Disease

## 2020-02-19 ENCOUNTER — Other Ambulatory Visit: Payer: Self-pay

## 2020-02-19 ENCOUNTER — Ambulatory Visit (INDEPENDENT_AMBULATORY_CARE_PROVIDER_SITE_OTHER): Payer: Medicare Other

## 2020-02-19 ENCOUNTER — Ambulatory Visit (INDEPENDENT_AMBULATORY_CARE_PROVIDER_SITE_OTHER): Payer: Medicare Other | Admitting: Pulmonary Disease

## 2020-02-19 VITALS — BP 138/70 | HR 82 | Ht 70.0 in | Wt 219.0 lb

## 2020-02-19 DIAGNOSIS — R0989 Other specified symptoms and signs involving the circulatory and respiratory systems: Secondary | ICD-10-CM | POA: Insufficient documentation

## 2020-02-19 DIAGNOSIS — I341 Nonrheumatic mitral (valve) prolapse: Secondary | ICD-10-CM | POA: Insufficient documentation

## 2020-02-19 DIAGNOSIS — R0602 Shortness of breath: Secondary | ICD-10-CM | POA: Insufficient documentation

## 2020-02-19 DIAGNOSIS — G4733 Obstructive sleep apnea (adult) (pediatric): Secondary | ICD-10-CM

## 2020-02-19 DIAGNOSIS — I350 Nonrheumatic aortic (valve) stenosis: Secondary | ICD-10-CM | POA: Diagnosis not present

## 2020-02-19 DIAGNOSIS — J398 Other specified diseases of upper respiratory tract: Secondary | ICD-10-CM | POA: Diagnosis not present

## 2020-02-19 LAB — COMPREHENSIVE METABOLIC PANEL
ALT: 53 U/L (ref 0–53)
AST: 29 U/L (ref 0–37)
Albumin: 4.4 g/dL (ref 3.5–5.2)
Alkaline Phosphatase: 53 U/L (ref 39–117)
BUN: 24 mg/dL — ABNORMAL HIGH (ref 6–23)
CO2: 30 mEq/L (ref 19–32)
Calcium: 9.6 mg/dL (ref 8.4–10.5)
Chloride: 101 mEq/L (ref 96–112)
Creatinine, Ser: 0.97 mg/dL (ref 0.40–1.50)
GFR: 73.5 mL/min (ref >60.00)
Glucose, Bld: 86 mg/dL (ref 70–99)
Potassium: 4 mEq/L (ref 3.5–5.1)
Sodium: 139 mEq/L (ref 135–145)
Total Bilirubin: 1.6 mg/dL — ABNORMAL HIGH (ref 0.2–1.2)
Total Protein: 6.7 g/dL (ref 6.0–8.3)

## 2020-02-19 MED ORDER — BREO ELLIPTA 100-25 MCG/INH IN AEPB: 1.0000 | INHALATION_SPRAY | Freq: Every day | RESPIRATORY_TRACT | 0 refills | Status: DC

## 2020-02-19 NOTE — Addendum Note (Signed)
Addended by: Mathis Bud on: 02/19/2020 10:25 AM   Modules accepted: Orders

## 2020-02-19 NOTE — Progress Notes (Signed)
@Patient  ID: Patrick Franco, male    DOB: Aug 14, 1932, 86 y.o.   MRN: [id number]  Chief Complaint  Patient presents with  . Follow-up    C/o sob x 1 yr. worse x 2-3 mths., no cough or wheeze, wears CPAP 8-10 hrs. each night    Referring provider: Lavone Orn, MD  HPI:  86 year old male former smoker followed in our office for tracheobronchomalacia, obstructive sleep apnea, and dyspnea on exertion  PMH: Hypertension, BPH, obesity, depression, nonrheumatic aortic stenosis, aortic stenosis, PACs, CAD Smoker/ Smoking History: Former smoker.  Limited smoking history.  Quit 1961 Maintenance:   Pt of: None Dr. Chase Caller  02/19/2020  - Visit   86 year old male former smoker followed in our office for obstructive sleep apnea, tracheobronchomalacia and dyspnea on exertion.  Patient presented today reporting that he has had 1 year of increased shortness of breath.  He reports that although he consistently exercises 5 times a week he has noticed he has been more short of breath.  He exercises at [facility].  He has been consistently using his CPAP.  He was trialed on Stiolto Respimat but he cannot remember if this provided any relief.  We have no formal pulmonary function testing on the patient.  We have 1 office promontory from 2018 which is difficult to read the results.  Patient has a high-resolution CT chest from 2019 which shows tracheobronchomalacia and air trapping.  Patient is also followed by cardiology for nonrheumatic aortic stenosis, PACs and CAD.  Patient walked in office showing no oxygen desaturations.  Patient's heart rate does go up to the 130s to 140s with physical exertion.  Patient's vital signs on arrival to office were stable.  Patient is followed by cardiology.  At last follow-up in July/2020 he was requested to follow-up in 1 year.  He has a follow-up echocardiogram pending to be done in July/2021  Questionaires / Pulmonary Flowsheets:   MMRC: mMRC Dyspnea Scale mMRC Score    02/19/2020 3     Tests:   12/27/2017-CT chest high resolution-tracheobronchial malacia, moderate patchy air trapping in both lungs indicative of small airways disease, scattered mild cylindrical varicoid bronchiectasis in both lungs, no other evidence of interstitial lung disease, borderline mild cardiomegaly, dilated main pulmonary artery suggestive of pulmonary arterial hypertension, left main three-vessel coronary artery disease  06/12/2019-echocardiogram-LV ejection fraction greater than 65%, right ventricle is normal systolic function, left atrial size moderately dilated, right atrial size mildly dilated, moderate mitral valve prolapse, mitral valve is abnormal  FENO:  No results found for: NITRICOXIDE  PFT: No flowsheet data found.  WALK:  SIX MIN WALK 02/19/2020 09/12/2018 12/08/2017  Supplimental Oxygen during Test? (L/min) No No No  Tech Comments: Walked 2 laps no complaints. - Pt walked at a slow pace completing all required laps. Only complaint was mild SOB.    Imaging: No results found.  Lab Results:  CBC    Component Value Date/Time   WBC 7.0 10/04/2019 1124   RBC 4.31 10/04/2019 1124   HGB 14.0 10/04/2019 1124   HCT 41.7 10/04/2019 1124   PLT 245.0 10/04/2019 1124   MCV 96.6 10/04/2019 1124   MCH 33.9 08/14/2019 0202   MCHC 33.6 10/04/2019 1124   RDW 14.6 10/04/2019 1124   LYMPHSABS 1.7 10/04/2019 1124   MONOABS 0.6 10/04/2019 1124   EOSABS 0.1 10/04/2019 1124   BASOSABS 0.0 10/04/2019 1124    BMET    Component Value Date/Time   NA 138 10/04/2019 1124   K  4.0 10/04/2019 1124   CL 99 10/04/2019 1124   CO2 29 10/04/2019 1124   GLUCOSE 110 (H) 10/04/2019 1124   BUN 15 10/04/2019 1124   CREATININE 0.87 10/04/2019 1124   CALCIUM 9.6 10/04/2019 1124   GFRNONAA >60 08/14/2019 0202   GFRAA >60 08/14/2019 0202    BNP No results found for: BNP  ProBNP No results found for: PROBNP  Specialty Problems      Pulmonary Problems   Sleep apnea   Pulmonary  air trapping   Shortness of breath   Tracheobronchomalacia      No Known Allergies  Immunization History  Administered Date(s) Administered  . Influenza Split 08/29/2012  . Influenza, High Dose Seasonal PF 09/10/2017, 08/14/2018, 08/29/2019  . Influenza,inj,Quad PF,6+ Mos 09/12/2013, 09/11/2015  . Moderna SARS-COVID-2 Vaccination 12/22/2019  . Pneumococcal Conjugate-13 01/13/2014  . Pneumococcal Polysaccharide-23 11/14/2004  . Tdap 01/18/2016    Past Medical History:  Diagnosis Date  . Aortic stenosis   . Arthritis   . BPH (benign prostatic hyperplasia)   . Chronic low back pain   . Depression   . Diverticulitis   . Elevated PSA   . Foot drop, right   . Gait disorder 02/03/2015  . Hip pain   . History of colonoscopy   . HOH (hard of hearing)    hearing aid  . Hypertension   . Lumbar radiculopathy   . Mitral valve prolapse   . Onychomycosis   . Peptic ulcer disease   . Sepsis ([place]) 2005   e. coli-after prostate bx  . Sleep apnea   . Torn ACL   . Tracheobronchomalacia    seen on 2019 Chest CT    Tobacco History: Social History   Tobacco Use  Smoking Status Former Smoker  . Packs/day: 0.50  . Types: Cigarettes  . Start date: 22  . Quit date: 19  . Years since quitting: 60.3  Smokeless Tobacco Never Used  Tobacco Comment   Pt smoked for 23months when in [employer]   Counseling given: Not Answered Comment: Pt smoked for 65months when in [employer]   Continue to not smoke  Outpatient Encounter Medications as of 02/19/2020  Medication Sig  . allopurinol (ZYLOPRIM) 100 MG tablet TAKE 1 TABLET ONCE DAILY.  [name] aspirin 81 MG chewable tablet Chew 1 tablet (81 mg total) by mouth 2 (two) times daily.  [name] atorvastatin (LIPITOR) 10 MG tablet Take 10 mg by mouth daily.   . bisacodyl (DULCOLAX) 10 MG suppository Place 10 mg rectally 2 (two) times daily as needed for moderate constipation.  . colchicine 0.6 MG tablet Take 1 tablet (0.6 mg total) by mouth 2 (two) times daily as  needed. (Patient taking differently: Take 0.6 mg by mouth 2 (two) times daily as needed (gout). )  . dutasteride (AVODART) 0.5 MG capsule Take 0.5 mg by mouth daily.  [name] esomeprazole (NEXIUM) 40 MG capsule Take 1 capsule (40 mg total) by mouth 2 (two) times daily before a meal. (Patient taking differently: Take 40 mg by mouth 2 (two) times daily before a meal. )  . gabapentin (NEURONTIN) 300 MG capsule Take 1 capsule (300 mg total) by mouth 3 (three) times daily. (Patient taking differently: Take 300 mg by mouth at bedtime. )  . indapamide (LOZOL) 2.5 MG tablet Take 2.5 mg by mouth daily.  . mirtazapine (REMERON) 30 MG tablet TAKE ONE TABLET AT BEDTIME.  . Multiple Vitamin (MULTIVITAMIN) tablet Take 1 tablet by mouth daily.  . Omega-3 Fatty Acids (  FISH OIL PO) Take 1,000 mg by mouth daily.  . Probiotic Product (PROBIOTIC DAILY PO) Take 1 tablet by mouth daily.  [name] Respiratory Therapy Supplies (FLUTTER) DEVI Use the flutter valve 10 times when doing it daily.  . tamsulosin (FLOMAX) 0.4 MG CAPS capsule Take 0.4 mg by mouth daily.  . Turmeric 500 MG CAPS Take 500 mg by mouth daily.  . verapamil (CALAN-SR) 240 MG CR tablet Take 240 mg by mouth daily.  . Vitamin D, Ergocalciferol, (DRISDOL) 1.25 MG (50000 UNIT) CAPS capsule TAKE 1 CAPSULE WEEKLY.  . gabapentin (NEURONTIN) 100 MG capsule TAKE 2 CAPSULES AT BEDTIME. (Patient not taking: Reported on 02/19/2020)  . Tiotropium Bromide-Olodaterol (STIOLTO RESPIMAT) 2.5-2.5 MCG/ACT AERS Inhale 2 puffs into the lungs daily. (Patient not taking: Reported on 02/19/2020)   No facility-administered encounter medications on file as of 02/19/2020.     Review of Systems  Review of Systems  Constitutional: Positive for fatigue. Negative for activity change, chills, fever and unexpected weight change.  HENT: Negative for postnasal drip, rhinorrhea, sinus pressure, sinus pain and sore throat.   Eyes: Negative.   Respiratory: Positive for shortness of breath. Negative  for cough and wheezing.   Cardiovascular: Positive for leg swelling. Negative for chest pain and palpitations.  Gastrointestinal: Negative for constipation, diarrhea, nausea and vomiting.  Endocrine: Negative.   Genitourinary: Negative.   Musculoskeletal: Negative.   Skin: Negative.   Neurological: Negative for dizziness and headaches.  Psychiatric/Behavioral: Negative.  Negative for dysphoric mood. The patient is not nervous/anxious.   All other systems reviewed and are negative.    Physical Exam  BP 138/70 (BP Location: Left Arm, Cuff Size: Normal)   Pulse 82   Ht 5\' 10"  (1.778 m)   Wt 219 lb (99.3 kg)   SpO2 97%   BMI 31.42 kg/m   Wt Readings from Last 5 Encounters:  02/19/20 219 lb (99.3 kg)  10/04/19 212 lb (96.2 kg)  08/13/19 205 lb (93 kg)  08/06/19 213 lb 9.6 oz (96.9 kg)  06/12/19 210 lb (95.3 kg)    BMI Readings from Last 5 Encounters:  02/19/20 31.42 kg/m  10/04/19 30.42 kg/m  08/13/19 29.41 kg/m  08/06/19 30.65 kg/m  06/12/19 30.13 kg/m     Physical Exam Vitals and nursing note reviewed.  Constitutional:      General: He is not in acute distress.    Appearance: Normal appearance. He is obese.  HENT:     Head: Normocephalic and atraumatic.     Right Ear: Hearing and external ear normal.     Left Ear: Hearing and external ear normal.     Ears:     Comments: Hearing aids bilaterally    Nose: Nose normal. No mucosal edema or rhinorrhea.     Right Turbinates: Not enlarged.     Left Turbinates: Not enlarged.     Mouth/Throat:     Mouth: Mucous membranes are dry.     Pharynx: Oropharynx is clear. No oropharyngeal exudate.  Eyes:     Pupils: Pupils are equal, round, and reactive to light.  Cardiovascular:     Rate and Rhythm: Normal rate and regular rhythm.     Pulses: Normal pulses.     Heart sounds: Murmur present. Systolic murmur present.  Pulmonary:     Effort: Pulmonary effort is normal.     Breath sounds: Normal breath sounds. No  decreased breath sounds, wheezing or rales.  Abdominal:     General: Abdomen is flat. Bowel sounds  are normal. There is distension.     Palpations: Abdomen is soft. There is no mass.     Tenderness: There is no abdominal tenderness. There is no guarding or rebound.     Comments: Tight  Musculoskeletal:     Cervical back: Normal range of motion.     Right lower leg: Edema (1+) present.     Left lower leg: Edema (1+) present.  Lymphadenopathy:     Cervical: No cervical adenopathy.  Skin:    General: Skin is warm and dry.     Capillary Refill: Capillary refill takes less than 2 seconds.     Findings: No erythema or rash.  Neurological:     General: No focal deficit present.     Mental Status: He is alert and oriented to person, place, and time.     Motor: No weakness.     Coordination: Coordination normal.     Gait: Gait is intact. Gait normal.  Psychiatric:        Mood and Affect: Mood normal.        Behavior: Behavior normal. Behavior is cooperative.        Thought Content: Thought content normal.        Judgment: Judgment normal.       Assessment & Plan:   Mitral valve prolapse Plan: Continue follow-up with cardiology Continue plan follow-up with echocardiogram in July Based off lab work may need to consider moving up echocardiogram  Aortic stenosis Plan: Continue follow-up with cardiology Continue forward with planned echocardiogram in July/2021  Sleep apnea Patient reporting daily CPAP compliance He reports that he cannot sleep without it  Plan: Continue CPAP therapy  Tracheobronchomalacia This is likely a component contributing the patient's shortness of breath  Plan: Chest x-ray today Pulmonary function test ordered  Pulmonary air trapping Pulmonary air trapping indicative of small airways disease on 2019 high-resolution CT chest  Plan: PFTs ordered today Trial of Breo Ellipta 100  Shortness of breath Patient reporting progressive dyspnea Walk  today in office oxygen saturations were stable, dropped to 90%, heart rate was elevated in the 130s to 140s during walk Patient's vital signs are stable on arrival today Low concern for PE or DVT at this time based off of clinical exam  Plan: Lab work today Chest x-ray today Emphasized that if patient's legs become unilaterally swollen, or if he has distinct lower extremity leg pain or chest pain or heart racing is occurring at rest to contact her office and we may need to obtain blood work such as a D-dimer or obtain CT imaging Trial of Breo Ellipta today PFTs ordered 4-week follow-up with Dr. Chase Caller    Return in about 4 weeks (around 03/18/2020), or if symptoms worsen or fail to improve, for Follow up with Dr. Purnell Shoemaker.   Lauraine Rinne, NP 02/19/2020   This appointment required 42 minutes of patient care (this includes precharting, chart review, review of results, face-to-face care, etc.).

## 2020-02-19 NOTE — Assessment & Plan Note (Signed)
Plan: Continue follow-up with cardiology Continue forward with planned echocardiogram in July/2021

## 2020-02-19 NOTE — Assessment & Plan Note (Signed)
Pulmonary air trapping indicative of small airways disease on 2019 high-resolution CT chest  Plan: PFTs ordered today Trial of Breo Ellipta 100

## 2020-02-19 NOTE — Assessment & Plan Note (Signed)
This is likely a component contributing the patient's shortness of breath  Plan: Chest x-ray today Pulmonary function test ordered

## 2020-02-19 NOTE — Patient Instructions (Addendum)
You were seen today by Lauraine Rinne, NP  for:   1. Shortness of breath  - Pro b natriuretic peptide (BNP); Future - DG Chest 2 View; Future - Comp Met (CMET); Future - Pulmonary function test; Future  Trial of Breo Ellipta 100 >>> Take 1 puff daily in the morning right when you wake up >>>Rinse your mouth out after use >>>This is a daily maintenance inhaler, NOT a rescue inhaler >>>Contact our office if you are having difficulties affording or obtaining this medication >>>It is important for you to be able to take this daily and not miss any doses   2. Tracheobronchomalacia  - Pulmonary function test; Future  3. Pulmonary air trapping  Trial of Breo    We recommend today:  Orders Placed This Encounter  Procedures  . DG Chest 2 View    Standing Status:   Future    Standing Expiration Date:   04/20/2021    Order Specific Question:   Reason for Exam (SYMPTOM  OR DIAGNOSIS REQUIRED)    Answer:   short of breath    Order Specific Question:   Preferred imaging location?    Answer:   Internal    Order Specific Question:   Radiology Contrast Protocol - do NOT remove file path    Answer:   [email or web address]  . Pro b natriuretic peptide (BNP)    Standing Status:   Future    Standing Expiration Date:   02/18/2021  . Comp Met (CMET)    Standing Status:   Future    Standing Expiration Date:   02/18/2021  . Pulmonary function test    Standing Status:   Future    Standing Expiration Date:   02/18/2021    Order Specific Question:   Where should this test be performed?    Answer:   [facility]    Order Specific Question:   Full PFT: includes the following: basic spirometry, spirometry pre & post bronchodilator, diffusion capacity (DLCO), lung volumes    Answer:   Full PFT   Orders Placed This Encounter  Procedures  . DG Chest 2 View  . Pro b natriuretic peptide (BNP)  . Comp Met (CMET)  . Pulmonary function test   No orders of the  defined types were placed in this encounter.   Follow Up:    Return in about 4 weeks (around 03/18/2020), or if symptoms worsen or fail to improve, for Follow up with Dr. Purnell Shoemaker.   Please do your part to reduce the spread of COVID-19:      Reduce your risk of any infection  and COVID19 by using the similar precautions used for avoiding the common cold or flu:  [name] Wash your hands often with soap and warm water for at least 20 seconds.  If soap and water are not readily available, use an alcohol-based hand sanitizer with at least 60% alcohol.  . If coughing or sneezing, cover your mouth and nose by coughing or sneezing into the elbow areas of your shirt or coat, into a tissue or into your sleeve (not your hands). Langley Gauss A MASK when in public  . Avoid shaking hands with others and consider head nods or verbal greetings only. . Avoid touching your eyes, nose, or mouth with unwashed hands.  . Avoid close contact with people who are sick. . Avoid places or events with large numbers of people in one location, like concerts or sporting events. . If you have some symptoms but  not all symptoms, continue to monitor at home and seek medical attention if your symptoms worsen. . If you are having a medical emergency, call 911.   [place] / e-Visit: [email or web address]         [facility]: [facility]: [phone]                   [facility]: [phone]     It is flu season:   >>> Best ways to protect herself from the flu: Receive the yearly flu vaccine, practice good hand hygiene washing with soap and also using hand sanitizer when available, eat a nutritious meals, get adequate rest, hydrate appropriately   Please contact the office if your symptoms worsen or you have concerns that you are not improving.   Thank you for choosing   [facility] for your healthcare, and for allowing [place] to partner with you on your healthcare journey. I am thankful to be able to provide care to you today.   Wyn Quaker FNP-C

## 2020-02-19 NOTE — Assessment & Plan Note (Signed)
Plan: Continue follow-up with cardiology Continue plan follow-up with echocardiogram in July Based off lab work may need to consider moving up echocardiogram

## 2020-02-19 NOTE — Assessment & Plan Note (Signed)
Patient reporting progressive dyspnea Walk today in office oxygen saturations were stable, dropped to 90%, heart rate was elevated in the 130s to 140s during walk Patient's vital signs are stable on arrival today Low concern for PE or DVT at this time based off of clinical exam  Plan: Lab work today Chest x-ray today Emphasized that if patient's legs become unilaterally swollen, or if he has distinct lower extremity leg pain or chest pain or heart racing is occurring at rest to contact her office and we may need to obtain blood work such as a D-dimer or obtain CT imaging Trial of Breo Ellipta today PFTs ordered 4-week follow-up with Dr. Chase Caller

## 2020-02-19 NOTE — Assessment & Plan Note (Signed)
Patient reporting daily CPAP compliance He reports that he cannot sleep without it  Plan: Continue CPAP therapy

## 2020-02-20 ENCOUNTER — Other Ambulatory Visit: Payer: Self-pay

## 2020-02-20 DIAGNOSIS — R0602 Shortness of breath: Secondary | ICD-10-CM

## 2020-02-20 LAB — PRO B NATRIURETIC PEPTIDE: NT-Pro BNP: 721 pg/mL — ABNORMAL HIGH (ref 0–486)

## 2020-02-20 MED ORDER — FUROSEMIDE 20 MG PO TABS: 20.0000 mg | ORAL_TABLET | Freq: Every day | ORAL | 0 refills | Status: DC

## 2020-02-21 ENCOUNTER — Telehealth: Payer: Self-pay | Admitting: Pulmonary Disease

## 2020-02-21 NOTE — Telephone Encounter (Signed)
Spoke with pt, advised him of results and he does know of Rx for Lasix and has an appt with Cardiology on Monday. Martie Lee   DG Chest 2 View: Result Notes   Lauraine Rinne, NP  02/19/2020 9:01 PM EDT    Chest x-ray showing some fluid retention. Still awaiting your lab work.  Based off your lab work I may have you present back to cardiology sooner than your planned follow-up in July.  Wyn Quaker, FNP

## 2020-02-21 NOTE — Telephone Encounter (Signed)
ATC patient unable to reach , left message to call back and that we did speak with his wife regarding results.

## 2020-02-21 NOTE — Telephone Encounter (Signed)
Pt is asking to get his CXR results.  Pt states this was already given to his spouse, but would like to discuss further.  Pt can be reached at [phone] or [phone].

## 2020-02-21 NOTE — Telephone Encounter (Signed)
02/21/2020  Per chart review it looks like the Lasix was sent in.  This information was given the patient spouse on 02/20/2020.  See note listed below:  Given symptoms as well as chest x-ray imaging I do believe it is reasonable to treat you with a short course of a diuretic to see if this helps with your symptoms.  Avoid high salt intake/ foods.   Please start Lasix 20 mg daily. Please contact cardiology and follow-up with them. You will need repeat lab work in 7 to 10 days either with our office or at your cardiology follow-up.  Please send prescription for Lasix 20 mg tablet Take 1 tablet (20 mg total) daily for next 7 days 10 tablets, no refills  If patient would like to get the lab work completed with our office please place an order for a bmet to be completed in 7 to 10 days.  Still contact cardiology. Keep follow-up with Dr. Chase Caller.  Wyn Quaker, FNP  Please review the lab result note under BNP for further information that is what this was copied from.  The patient has additional questions please have him speak with his wife.  If the patient is uncomfortable starting the Lasix and wants to discuss this with cardiology is more than welcome to do so.  Wyn Quaker, FNP

## 2020-02-24 ENCOUNTER — Ambulatory Visit: Payer: Medicare Other | Admitting: Cardiovascular Disease

## 2020-02-25 NOTE — Telephone Encounter (Signed)
LMTCB x2 for pt 

## 2020-02-27 ENCOUNTER — Telehealth: Payer: Self-pay | Admitting: Pulmonary Disease

## 2020-02-27 NOTE — Telephone Encounter (Signed)
Spoke with pt's wife. She states that the pt is out of town at this time. She will have him call us when he gets back in town.

## 2020-02-27 NOTE — Telephone Encounter (Signed)
Lauraine Rinne, NP  02/19/2020 9:01 PM EDT    Chest x-ray showing some fluid retention. Still awaiting your lab work.  Based off your lab work I may have you present back to cardiology sooner than your planned follow-up in July.  Wyn Quaker, FNP   Called and spoke with pt letting him know the results of the cxr and pt verbalized understanding. Pt stated he has a cardiology appt 4/22. Nothing further needed.

## 2020-03-02 ENCOUNTER — Other Ambulatory Visit (HOSPITAL_COMMUNITY)
Admission: RE | Admit: 2020-03-02 | Discharge: 2020-03-02 | Disposition: A | Discharge status: Home / Self Care | Payer: Medicare Other | Source: Ambulatory Visit | Attending: Pulmonary Disease | Admitting: Pulmonary Disease

## 2020-03-02 DIAGNOSIS — Z01812 Encounter for preprocedural laboratory examination: Secondary | ICD-10-CM | POA: Insufficient documentation

## 2020-03-02 DIAGNOSIS — Z20822 Contact with and (suspected) exposure to covid-19: Secondary | ICD-10-CM | POA: Insufficient documentation

## 2020-03-02 LAB — SARS CORONAVIRUS 2 (TAT 6-24 HRS): SARS Coronavirus 2: NEGATIVE

## 2020-03-02 NOTE — Progress Notes (Signed)
SARS-CoV-2 test is negative.  This is good news.  Proceed forward with work-up as planned.   , FNP 

## 2020-03-04 ENCOUNTER — Other Ambulatory Visit: Payer: Self-pay

## 2020-03-04 ENCOUNTER — Ambulatory Visit (INDEPENDENT_AMBULATORY_CARE_PROVIDER_SITE_OTHER): Payer: Medicare Other | Admitting: Internal Medicine

## 2020-03-04 DIAGNOSIS — J398 Other specified diseases of upper respiratory tract: Secondary | ICD-10-CM

## 2020-03-04 DIAGNOSIS — R0602 Shortness of breath: Secondary | ICD-10-CM | POA: Diagnosis not present

## 2020-03-04 LAB — PULMONARY FUNCTION TEST
DL/VA % pred: 101 %
DL/VA: 3.9 ml/min/mmHg/L
DLCO cor % pred: 65 %
DLCO cor: 15.16 ml/min/mmHg
DLCO unc % pred: 65 %
DLCO unc: 15.16 ml/min/mmHg
FEF 25-75 Post: 1.15 L/sec
FEF 25-75 Pre: 0.81 L/sec
FEF2575-%Change-Post: 41 %
FEF2575-%Pred-Post: 70 %
FEF2575-%Pred-Pre: 49 %
FEV1-%Change-Post: 8 %
FEV1-%Pred-Post: 63 %
FEV1-%Pred-Pre: 58 %
FEV1-Post: 1.6 L
FEV1-Pre: 1.48 L
FEV1FVC-%Change-Post: 10 %
FEV1FVC-%Pred-Pre: 98 %
FEV6-%Change-Post: 1 %
FEV6-%Pred-Post: 62 %
FEV6-%Pred-Pre: 61 %
FEV6-Post: 2.1 L
FEV6-Pre: 2.06 L
FEV6FVC-%Change-Post: 3 %
FEV6FVC-%Pred-Post: 108 %
FEV6FVC-%Pred-Pre: 104 %
FVC-%Change-Post: -1 %
FVC-%Pred-Post: 57 %
FVC-%Pred-Pre: 58 %
FVC-Post: 2.1 L
FVC-Pre: 2.14 L
Post FEV1/FVC ratio: 76 %
Post FEV6/FVC ratio: 100 %
Pre FEV1/FVC ratio: 69 %
Pre FEV6/FVC Ratio: 97 %

## 2020-03-04 LAB — BASIC METABOLIC PANEL
BUN: 24 mg/dL — ABNORMAL HIGH (ref 6–23)
CO2: 28 mEq/L (ref 19–32)
Calcium: 9.3 mg/dL (ref 8.4–10.5)
Chloride: 103 mEq/L (ref 96–112)
Creatinine, Ser: 1.04 mg/dL (ref 0.40–1.50)
GFR: 67.82 mL/min (ref >60.00)
Glucose, Bld: 111 mg/dL — ABNORMAL HIGH (ref 70–99)
Potassium: 4.3 mEq/L (ref 3.5–5.1)
Sodium: 139 mEq/L (ref 135–145)

## 2020-03-04 NOTE — Progress Notes (Signed)
PFT done today. 

## 2020-03-05 ENCOUNTER — Inpatient Hospital Stay (HOSPITAL_COMMUNITY)
Admission: AD | Admit: 2020-03-05 | Discharge: 2020-03-08 | DRG: 308 | Disposition: A | Discharge status: Home / Self Care | Payer: Medicare Other | Source: Ambulatory Visit | Attending: Cardiovascular Disease | Admitting: Cardiovascular Disease

## 2020-03-05 ENCOUNTER — Inpatient Hospital Stay (HOSPITAL_COMMUNITY): Payer: Medicare Other

## 2020-03-05 ENCOUNTER — Encounter: Payer: Self-pay | Admitting: Cardiovascular Disease

## 2020-03-05 ENCOUNTER — Ambulatory Visit (INDEPENDENT_AMBULATORY_CARE_PROVIDER_SITE_OTHER): Payer: Medicare Other | Admitting: Cardiovascular Disease

## 2020-03-05 ENCOUNTER — Other Ambulatory Visit: Payer: Self-pay

## 2020-03-05 VITALS — BP 128/70 | HR 70 | Ht 70.0 in | Wt 219.0 lb

## 2020-03-05 DIAGNOSIS — J439 Emphysema, unspecified: Secondary | ICD-10-CM | POA: Diagnosis not present

## 2020-03-05 DIAGNOSIS — Z79899 Other long term (current) drug therapy: Secondary | ICD-10-CM

## 2020-03-05 DIAGNOSIS — I110 Hypertensive heart disease with heart failure: Secondary | ICD-10-CM | POA: Diagnosis present

## 2020-03-05 DIAGNOSIS — R0600 Dyspnea, unspecified: Principal | ICD-10-CM

## 2020-03-05 DIAGNOSIS — Z7982 Long term (current) use of aspirin: Secondary | ICD-10-CM

## 2020-03-05 DIAGNOSIS — I5033 Acute on chronic diastolic (congestive) heart failure: Secondary | ICD-10-CM | POA: Diagnosis present

## 2020-03-05 DIAGNOSIS — Z596 Low income: Secondary | ICD-10-CM

## 2020-03-05 DIAGNOSIS — Z801 Family history of malignant neoplasm of trachea, bronchus and lung: Secondary | ICD-10-CM | POA: Diagnosis not present

## 2020-03-05 DIAGNOSIS — I5031 Acute diastolic (congestive) heart failure: Secondary | ICD-10-CM

## 2020-03-05 DIAGNOSIS — Z87891 Personal history of nicotine dependence: Secondary | ICD-10-CM

## 2020-03-05 DIAGNOSIS — I2510 Atherosclerotic heart disease of native coronary artery without angina pectoris: Secondary | ICD-10-CM

## 2020-03-05 DIAGNOSIS — I4892 Unspecified atrial flutter: Secondary | ICD-10-CM | POA: Diagnosis not present

## 2020-03-05 DIAGNOSIS — F329 Major depressive disorder, single episode, unspecified: Secondary | ICD-10-CM | POA: Diagnosis not present

## 2020-03-05 DIAGNOSIS — I483 Typical atrial flutter: Secondary | ICD-10-CM | POA: Diagnosis not present

## 2020-03-05 DIAGNOSIS — I5030 Unspecified diastolic (congestive) heart failure: Secondary | ICD-10-CM

## 2020-03-05 DIAGNOSIS — I341 Nonrheumatic mitral (valve) prolapse: Secondary | ICD-10-CM | POA: Diagnosis not present

## 2020-03-05 DIAGNOSIS — Z808 Family history of malignant neoplasm of other organs or systems: Secondary | ICD-10-CM | POA: Diagnosis not present

## 2020-03-05 DIAGNOSIS — G4730 Sleep apnea, unspecified: Secondary | ICD-10-CM | POA: Diagnosis present

## 2020-03-05 DIAGNOSIS — Z7951 Long term (current) use of inhaled steroids: Secondary | ICD-10-CM | POA: Diagnosis not present

## 2020-03-05 DIAGNOSIS — I350 Nonrheumatic aortic (valve) stenosis: Secondary | ICD-10-CM | POA: Diagnosis present

## 2020-03-05 DIAGNOSIS — E785 Hyperlipidemia, unspecified: Secondary | ICD-10-CM | POA: Diagnosis present

## 2020-03-05 DIAGNOSIS — I484 Atypical atrial flutter: Secondary | ICD-10-CM | POA: Diagnosis present

## 2020-03-05 DIAGNOSIS — I351 Nonrheumatic aortic (valve) insufficiency: Secondary | ICD-10-CM | POA: Diagnosis not present

## 2020-03-05 DIAGNOSIS — I359 Nonrheumatic aortic valve disorder, unspecified: Secondary | ICD-10-CM

## 2020-03-05 DIAGNOSIS — Z20822 Contact with and (suspected) exposure to covid-19: Secondary | ICD-10-CM | POA: Diagnosis present

## 2020-03-05 DIAGNOSIS — I340 Nonrheumatic mitral (valve) insufficiency: Secondary | ICD-10-CM | POA: Diagnosis not present

## 2020-03-05 LAB — COMPREHENSIVE METABOLIC PANEL
ALT: 71 U/L — ABNORMAL HIGH (ref 0–44)
AST: 51 U/L — ABNORMAL HIGH (ref 15–41)
Albumin: 3.7 g/dL (ref 3.5–5.0)
Alkaline Phosphatase: 49 U/L (ref 38–126)
Anion gap: 14 (ref 5–15)
BUN: 20 mg/dL (ref 8–23)
CO2: 21 mmol/L — ABNORMAL LOW (ref 22–32)
Calcium: 8.8 mg/dL — ABNORMAL LOW (ref 8.9–10.3)
Chloride: 99 mmol/L (ref 98–111)
Creatinine, Ser: 1.06 mg/dL (ref 0.61–1.24)
GFR calc Af Amer: >60 mL/min (ref >60)
GFR calc non Af Amer: >60 mL/min (ref >60)
Glucose, Bld: 178 mg/dL — ABNORMAL HIGH (ref 70–99)
Potassium: 3.9 mmol/L (ref 3.5–5.1)
Sodium: 134 mmol/L — ABNORMAL LOW (ref 135–145)
Total Bilirubin: 1.5 mg/dL — ABNORMAL HIGH (ref 0.3–1.2)
Total Protein: 6 g/dL — ABNORMAL LOW (ref 6.5–8.1)

## 2020-03-05 LAB — CBC WITH DIFFERENTIAL/PLATELET
Abs Immature Granulocytes: 0.01 10*3/uL (ref 0.00–0.07)
Basophils Absolute: 0 10*3/uL (ref 0.0–0.1)
Basophils Relative: 1 %
Eosinophils Absolute: 0.1 10*3/uL (ref 0.0–0.5)
Eosinophils Relative: 2 %
HCT: 42.4 % (ref 39.0–52.0)
Hemoglobin: 13.9 g/dL (ref 13.0–17.0)
Immature Granulocytes: 0 %
Lymphocytes Relative: 22 %
Lymphs Abs: 1.4 10*3/uL (ref 0.7–4.0)
MCH: 32.6 pg (ref 26.0–34.0)
MCHC: 32.8 g/dL (ref 30.0–36.0)
MCV: 99.3 fL (ref 80.0–100.0)
Monocytes Absolute: 0.4 10*3/uL (ref 0.1–1.0)
Monocytes Relative: 7 %
Neutro Abs: 4.3 10*3/uL (ref 1.7–7.7)
Neutrophils Relative %: 68 %
Platelets: 208 10*3/uL (ref 150–400)
RBC: 4.27 MIL/uL (ref 4.22–5.81)
RDW: 14.5 % (ref 11.5–15.5)
WBC: 6.2 10*3/uL (ref 4.0–10.5)
nRBC: 0 % (ref 0.0–0.2)

## 2020-03-05 LAB — RESPIRATORY PANEL BY RT PCR (FLU A&B, COVID)
Influenza A by PCR: NEGATIVE
Influenza B by PCR: NEGATIVE
SARS Coronavirus 2 by RT PCR: NEGATIVE

## 2020-03-05 LAB — TSH: TSH: 2.464 u[IU]/mL (ref 0.350–4.500)

## 2020-03-05 LAB — MAGNESIUM: Magnesium: 1.9 mg/dL (ref 1.7–2.4)

## 2020-03-05 LAB — MRSA PCR SCREENING: MRSA by PCR: NEGATIVE

## 2020-03-05 MED ORDER — HEPARIN (PORCINE) 25000 UT/250ML-% IV SOLN
1300.0000 [IU]/h | INTRAVENOUS | Status: DC
  Administered 2020-03-05 – 2020-03-06 (×2): 1300 [IU]/h via INTRAVENOUS
  Filled 2020-03-05 (×2): qty 250

## 2020-03-05 MED ORDER — ALLOPURINOL 100 MG PO TABS
100.0000 mg | ORAL_TABLET | Freq: Every day | ORAL | Status: DC
  Administered 2020-03-06 – 2020-03-08 (×3): 100 mg via ORAL
  Filled 2020-03-05 (×3): qty 1

## 2020-03-05 MED ORDER — FLUTICASONE FUROATE-VILANTEROL 100-25 MCG/INH IN AEPB
1.0000 | INHALATION_SPRAY | Freq: Every day | RESPIRATORY_TRACT | Status: DC
  Filled 2020-03-05: qty 28

## 2020-03-05 MED ORDER — ACETAMINOPHEN 325 MG PO TABS: 650.0000 mg | ORAL_TABLET | ORAL | Status: DC | PRN

## 2020-03-05 MED ORDER — VERAPAMIL HCL 120 MG PO TABS
240.0000 mg | ORAL_TABLET | Freq: Every day | ORAL | Status: DC
  Administered 2020-03-06 – 2020-03-07 (×2): 240 mg via ORAL
  Filled 2020-03-05 (×3): qty 2

## 2020-03-05 MED ORDER — DUTASTERIDE 0.5 MG PO CAPS
0.5000 mg | ORAL_CAPSULE | Freq: Every day | ORAL | Status: DC
  Administered 2020-03-06 – 2020-03-08 (×3): 0.5 mg via ORAL
  Filled 2020-03-05 (×3): qty 1

## 2020-03-05 MED ORDER — BISACODYL 10 MG RE SUPP: 10.0000 mg | Freq: Two times a day (BID) | RECTAL | Status: DC | PRN

## 2020-03-05 MED ORDER — TIOTROPIUM BROMIDE-OLODATEROL 2.5-2.5 MCG/ACT IN AERS: 2.0000 | INHALATION_SPRAY | Freq: Every day | RESPIRATORY_TRACT | Status: DC

## 2020-03-05 MED ORDER — TAMSULOSIN HCL 0.4 MG PO CAPS
0.4000 mg | ORAL_CAPSULE | Freq: Every day | ORAL | Status: DC
  Filled 2020-03-05: qty 1

## 2020-03-05 MED ORDER — ATORVASTATIN CALCIUM 10 MG PO TABS
10.0000 mg | ORAL_TABLET | Freq: Every day | ORAL | Status: DC
  Administered 2020-03-06 – 2020-03-08 (×3): 10 mg via ORAL
  Filled 2020-03-05 (×3): qty 1

## 2020-03-05 MED ORDER — ONDANSETRON HCL 4 MG/2ML IJ SOLN: 4.0000 mg | Freq: Four times a day (QID) | INTRAMUSCULAR | Status: DC | PRN

## 2020-03-05 MED ORDER — GABAPENTIN 300 MG PO CAPS
300.0000 mg | ORAL_CAPSULE | Freq: Every day | ORAL | Status: DC
  Administered 2020-03-05 – 2020-03-07 (×3): 300 mg via ORAL
  Filled 2020-03-05 (×3): qty 1

## 2020-03-05 MED ORDER — DUTASTERIDE 0.5 MG PO CAPS
0.5000 mg | ORAL_CAPSULE | Freq: Every day | ORAL | Status: DC
  Filled 2020-03-05: qty 1

## 2020-03-05 MED ORDER — ASPIRIN EC 81 MG PO TBEC
81.0000 mg | DELAYED_RELEASE_TABLET | Freq: Every day | ORAL | Status: DC
  Administered 2020-03-06 – 2020-03-08 (×3): 81 mg via ORAL
  Filled 2020-03-05 (×3): qty 1

## 2020-03-05 MED ORDER — APIXABAN 2.5 MG PO TABS: 2.5000 mg | ORAL_TABLET | Freq: Two times a day (BID) | ORAL | Status: DC

## 2020-03-05 MED ORDER — DILTIAZEM HCL-DEXTROSE 125-5 MG/125ML-% IV SOLN (PREMIX): 5.0000 mg/h | INTRAVENOUS | Status: DC

## 2020-03-05 MED ORDER — ATORVASTATIN CALCIUM 10 MG PO TABS
10.0000 mg | ORAL_TABLET | Freq: Every day | ORAL | Status: DC
  Filled 2020-03-05: qty 1

## 2020-03-05 MED ORDER — FLUTICASONE FUROATE-VILANTEROL 100-25 MCG/INH IN AEPB
1.0000 | INHALATION_SPRAY | Freq: Every day | RESPIRATORY_TRACT | Status: DC
  Administered 2020-03-06 – 2020-03-08 (×3): 1 via RESPIRATORY_TRACT
  Filled 2020-03-05 (×2): qty 28

## 2020-03-05 MED ORDER — HEPARIN BOLUS VIA INFUSION
4000.0000 [IU] | Freq: Once | INTRAVENOUS | Status: AC
  Administered 2020-03-05: 4000 [IU] via INTRAVENOUS
  Filled 2020-03-05: qty 4000

## 2020-03-05 MED ORDER — FUROSEMIDE 10 MG/ML IJ SOLN
40.0000 mg | Freq: Two times a day (BID) | INTRAMUSCULAR | Status: DC
  Administered 2020-03-05 – 2020-03-07 (×4): 40 mg via INTRAVENOUS
  Filled 2020-03-05 (×4): qty 4

## 2020-03-05 MED ORDER — TAMSULOSIN HCL 0.4 MG PO CAPS
0.4000 mg | ORAL_CAPSULE | Freq: Every day | ORAL | Status: DC
  Administered 2020-03-06 – 2020-03-08 (×3): 0.4 mg via ORAL
  Filled 2020-03-05 (×3): qty 1

## 2020-03-05 MED ORDER — MIRTAZAPINE 15 MG PO TABS
30.0000 mg | ORAL_TABLET | Freq: Every day | ORAL | Status: DC
  Administered 2020-03-05 – 2020-03-07 (×3): 30 mg via ORAL
  Filled 2020-03-05 (×3): qty 2

## 2020-03-05 NOTE — H&P (Addendum)
Cardiology Admission History and Physical:   Patient ID: Patrick Franco MRN: [id number]; DOB: 08/03/1932   Admission date: 03/05/2020  Primary Care Provider: Lavone Orn, MD Primary Cardiologist: [name], MD  Primary Electrophysiologist:  None   Chief Complaint:  SOB, LE edema  Patient Profile:   Patrick Franco is a 86 y.o. male with CAD, aortic stenosis, HTN, mitral valve prolapse and sleep apnea.  History of Present Illness:   Patient seen outpatient by Dr. Angelena Form today (03/05/20) with recommendations for direct admission. The following H&P is obtained from Dr. Camillia Herter office note.   86 yo male with history of CAD, aortic stenosis, HTN, mitral valve prolapse and sleep apnea here today for cardiac follow up. He was seen in our office for evaluation of aortic stenosis and abnormal EKG in May 2018. He was having a colonoscopy on 04/04/17 and was found to have an irregular heart rhythm. He was sent to his primary care office where an EKG showed sinus rhythm with PACs. Echo November 2017 with normal LV systolic function, QIHK=74-25%, grade 1 diastolic dysfunction, functionally bicuspid aortic valve with moderate stenosis (mean gradient 21 mmHg, AVA 1.4cm2). There was mild mitral regurgitation. At his first visit here in May 2018, he described dyspnea and less energy but no chest pain or dizziness. Nuclear stress test May 2018 with no ischemia. Echo July 2020 showed normal LV systolic function, mild aortic valve stenosis (mean gradient 17 mmHg), mild AI. He has been on ASA and a statin due to finding of CAD on the chest CT.   He is here today for follow up. He feels terrible. He has gained 10-15 lbs over the past two weeks. He has dyspnea with minimal exertion. He has lower extremity edema and abdominal bloating. He has no chest pain. No dizziness, near syncope or syncope. He has no awareness of his heart racing.   EKG today with atrial flutter with RVR.    Past Medical History:   Diagnosis Date  . Aortic stenosis   . Arthritis   . BPH (benign prostatic hyperplasia)   . Chronic low back pain   . Depression   . Diverticulitis   . Elevated PSA   . Foot drop, right   . Gait disorder 02/03/2015  . Hip pain   . History of colonoscopy   . HOH (hard of hearing)    hearing aid  . Hypertension   . Lumbar radiculopathy   . Mitral valve prolapse   . Onychomycosis   . Peptic ulcer disease   . Sepsis ([place]) 2005   e. coli-after prostate bx  . Sleep apnea   . Torn ACL   . Tracheobronchomalacia    seen on 2019 Chest CT    Past Surgical History:  Procedure Laterality Date  . CATARACT EXTRACTION Bilateral   . ESOPHAGOGASTRODUODENOSCOPY N/A 08/19/2015   Procedure: ESOPHAGOGASTRODUODENOSCOPY (EGD);  Surgeon: Laurence Spates, MD;  Location: [facility] ENDOSCOPY;  Service: Endoscopy;  Laterality: N/A;  . right knee replacement Right 1996  . SHOULDER SURGERY    . TONSILLECTOMY    . TOTAL HIP ARTHROPLASTY Right 08/13/2019   Procedure: RIGHT TOTAL HIP ARTHROPLASTY ANTERIOR APPROACH;  Surgeon: Mcarthur Rossetti, MD;  Location: [place];  Service: Orthopedics;  Laterality: Right;     Medications Prior to Admission: Prior to Admission medications   Medication Sig Start Date End Date Taking? Authorizing Provider  allopurinol (ZYLOPRIM) 100 MG tablet TAKE 1 TABLET ONCE DAILY. 02/11/20   Lyndal Pulley, DO  aspirin EC 81 MG tablet Take 81 mg by mouth daily.    [provider]  atorvastatin (LIPITOR) 10 MG tablet Take 10 mg by mouth daily.  02/19/18   [provider]  bisacodyl (DULCOLAX) 10 MG suppository Place 10 mg rectally 2 (two) times daily as needed for moderate constipation.    [provider]  colchicine 0.6 MG tablet Take 1 tablet (0.6 mg total) by mouth 2 (two) times daily as needed. Patient taking differently: Take 0.6 mg by mouth 2 (two) times daily as needed (gout).  07/04/17   Lyndal Pulley, DO  dutasteride (AVODART) 0.5 MG capsule Take 0.5  mg by mouth daily. 01/06/15   [provider]  fluticasone furoate-vilanterol (BREO ELLIPTA) 100-25 MCG/INH AEPB Inhale 1 puff into the lungs daily. 02/19/20   Lauraine Rinne, NP  gabapentin (NEURONTIN) 300 MG capsule Take 1 capsule (300 mg total) by mouth 3 (three) times daily. Patient taking differently: Take 300 mg by mouth at bedtime.  10/04/19   Lyndal Pulley, DO  indapamide (LOZOL) 2.5 MG tablet Take 2.5 mg by mouth daily. 01/21/15   [provider]  mirtazapine (REMERON) 30 MG tablet TAKE ONE TABLET AT BEDTIME. 12/31/19   Lyndal Pulley, DO  Multiple Vitamin (MULTIVITAMIN) tablet Take 1 tablet by mouth daily.    [provider]  Omega-3 Fatty Acids (FISH OIL PO) Take 1,000 mg by mouth daily.    [provider]  Probiotic Product (PROBIOTIC DAILY PO) Take 1 tablet by mouth daily.    [provider]  Respiratory Therapy Supplies (FLUTTER) DEVI Use the flutter valve 10 times when doing it daily. 01/25/18   Brand Males, MD  tamsulosin (FLOMAX) 0.4 MG CAPS capsule Take 0.4 mg by mouth daily. 01/06/15   [provider]  Tiotropium Bromide-Olodaterol (STIOLTO RESPIMAT) 2.5-2.5 MCG/ACT AERS Inhale 2 puffs into the lungs daily. 03/15/18   Brand Males, MD  Turmeric 500 MG CAPS Take 500 mg by mouth daily.    [provider]  verapamil (CALAN-SR) 240 MG CR tablet Take 240 mg by mouth daily. 01/21/15   [provider]  Vitamin D, Ergocalciferol, (DRISDOL) 1.25 MG (50000 UNIT) CAPS capsule TAKE 1 CAPSULE WEEKLY. 01/07/20   Lyndal Pulley, DO     Allergies:   No Known Allergies  Social History:   Social History   Socioeconomic History  . Marital status: Married    Spouse name: Not on file  . Number of children: 2  . Years of education: Not on file  . Highest education level: Not on file  Occupational History  . Occupation: Retired Social research officer, government  Tobacco Use  . Smoking status: Former Smoker    Packs/day: 0.50     Types: Cigarettes    Start date: 1960    Quit date: 1961    Years since quitting: 60.3  . Smokeless tobacco: Never Used  . 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 Alcohol use: Not on file  . Drug use: No  . Sexual activity: Yes  Other Topics Concern  . Not on file  Social History Narrative   Patient is right handed.   Patient drinks 1 cup of coffee per day.   Social Determinants of Health   Financial Resource Strain:   . Difficulty of Paying Living Expenses:   Food Insecurity:   . Worried About RCharity fundraiserin the Last Year:   .  Ran Out of Food in the Last Year:   Transportation Needs:   . Film/video editor (Medical):   [name] Lack of Transportation (Non-Medical):   Physical Activity:   . Days of Exercise per Week:   . Minutes of Exercise per Session:   Stress:   . Feeling of Stress :   Social Connections:   . Frequency of Communication with Friends and Family:   . Frequency of Social Gatherings with Friends and Family:   . Attends Religious Services:   . Active Member of Clubs or Organizations:   . Attends Archivist Meetings:   [name] Marital Status:   Intimate Partner Violence:   . Fear of Current or Ex-Partner:   . Emotionally Abused:   [name] Physically Abused:   . Sexually Abused:     Family History:   The patient's family history includes Cancer - Lung (age of onset: 36) in his father; Multiple myeloma (age of onset: 75) in his brother; Pneumonia (age of onset: 65) in his mother.    ROS:  Please see the history of present illness.  All other ROS reviewed and negative.     Physical Exam/Data:   Vitals:   03/05/20 1809  BP: (!) 148/101  Pulse: 65  Resp: 18  Temp: 98.3 F (36.8 C)  TempSrc: Oral  SpO2: 98%  Weight: 96.3 kg  Height: 5' 10"  (1.778 m)   No intake or output data in the 24 hours ending 03/05/20 1846 Last 3 Weights 03/05/2020 03/05/2020 02/19/2020  Weight (lbs) 212 lb 4.8 oz 219 lb 219  lb  Weight (kg) 96.299 kg 99.338 kg 99.338 kg     Body mass index is 30.46 kg/m.  Per Dr. Angelena Form:  General: Well developed, well nourished, NAD  HEENT: OP clear, mucus membranes moist  SKIN: warm, dry. No rashes. Neuro: No focal deficits  Musculoskeletal: Muscle strength 5/5 all ext  Psychiatric: Mood and affect normal  Neck: No JVD, no carotid bruits, no thyromegaly, no lymphadenopathy.  Lungs:Clear bilaterally, no wheezes, rhonci, crackles Cardiovascular: Irregular irregular. Systolic murmur.  Abdomen:Soft. Bowel sounds present. Non-tender.  Extremities: 1+ bilateral lower extremity edema. Pulses are 2 + in the bilateral DP/PT.   EKG:  Per office EKG - Dr. Camillia Herter interpretation: The ekg ordered today demonstrates Atrial flutter, Rate 110-130 bpm  Relevant CV Studies: Echo July 2020:  1. The left ventricle has hyperdynamic systolic function, with an  ejection fraction of >65%. The cavity size was normal. There is moderately  increased left ventricular wall thickness. Left ventricular diastolic  Doppler parameters are indeterminate. No  evidence of left ventricular regional wall motion abnormalities.  2. The right ventricle has normal systolic function. The cavity was  normal. There is no increase in right ventricular wall thickness.  3. Left atrial size was moderately dilated.  4. Right atrial size was mildly dilated.  5. Moderate mitral valve prolapse.  6. The mitral valve is abnormal. Mild thickening of the mitral valve  leaflet. There is mild to moderate mitral annular calcification present.  7. The tricuspid valve is grossly normal.  8. The aortic valve is tricuspid. Mild calcification of the aortic valve.  Aortic valve regurgitation is trivial by color flow Doppler. Mild stenosis  of the aortic valve.  9. The aorta is normal in size and structure.   Laboratory Data:  High Sensitivity Troponin:  No results for input(s): TROPONINIHS in the last 720  hours.    Chemistry Recent Labs  Lab 03/04/20 1032  NA 139  K 4.3  CL 103  CO2 28  GLUCOSE 111*  BUN 24*  CREATININE 1.04  CALCIUM 9.3    No results for input(s): PROT, ALBUMIN, AST, ALT, ALKPHOS, BILITOT in the last 168 hours. HematologyNo results for input(s): WBC, RBC, HGB, HCT, MCV, MCH, MCHC, RDW, PLT in the last 168 hours. BNPNo results for input(s): BNP, PROBNP in the last 168 hours.  DDimer No results for input(s): DDIMER in the last 168 hours.   Radiology/Studies:  No results found. {  Assessment and Plan:   1. Acute on chronic diastolic CHF:  2. New onset atrial flutter with RVR 3. Moderate aortic stenosis 4. CAD without angina  He is volume overloaded and is now in atrial flutter with RVR. He is feeling poorly. I will admit [facility] to [facility] for [facility] management.  -Will plan to start IV Cardizem for rate control. Hold home verapamil -Will start IV heparin -Diuresis with Lasix 40 mg IV BID -Echo to assess aortic stenosis  Severity of Illness: The appropriate patient status for this patient is INPATIENT. Inpatient status is judged to be reasonable and necessary in order to provide the required intensity of service to ensure the patient's safety. The patient's presenting symptoms, physical exam findings, and initial radiographic and laboratory data in the context of their chronic comorbidities is felt to place them at high risk for further clinical deterioration. Furthermore, it is not anticipated that the patient will be medically stable for discharge from the hospital within 2 midnights of admission. The following factors support the patient status of inpatient.   " The patient's presenting symptoms include shortness of breath, LE edema. " The worrisome physical exam findings include IRIR, LE edema. " The initial radiographic and laboratory data are worrisome because of EKG with atrial flutter with rate 110s-130s. " The chronic co-morbidities include CAD, aortic  stenosis, HTN.   * I certify that at the point of admission it is my clinical judgment that the patient will require inpatient hospital care spanning beyond 2 midnights from the point of admission due to high intensity of service, high risk for further deterioration and high frequency of surveillance required.*    For questions or updates, please contact [place] Please consult [email or web address] for contact info under    [name] 03/05/2020 8:58 PM

## 2020-03-05 NOTE — Progress Notes (Signed)
ANTICOAGULATION CONSULT NOTE - Initial Consult  Pharmacy Consult for Heparin Indication: Aflutter  No Known Allergies  Patient Measurements: Height: 5\' 10"  (177.8 cm) Weight: 96.3 kg (212 lb 4.8 oz) IBW/kg (Calculated) : 73 Heparin Dosing Weight: 82.3  Assessment: 86yo with multiple medical problems including mitral valve prolapse sent to the hospital by his primary physician for follow up.  We have been asked to start IV heparin for A-flutter.  Labs ordered but CBC has not resulted yet - will confirm once available.  No current noted bleeding complications.  He has not been taking any anticoagulation prior to admit.  Goal of Therapy:  Heparin level 0.3-0.7 units/ml Monitor platelets by anticoagulation protocol: Yes   Plan:  - Heparin 4000 units IV bolus x 1 - Heparin 1300 units/hr - Obtain a heparin level in 6 hours and then daily - F/u CBC this evening and daily   Vital Signs: Temp: 98.3 F (36.8 C) (04/22 1809) Temp Source: Oral (04/22 1809) BP: 148/101 (04/22 1809) Pulse Rate: 65 (04/22 1809)  Labs: Recent Labs    03/04/20 1032  CREATININE 1.04    Estimated Creatinine Clearance: 60.5 mL/min (by C-G formula based on SCr of 1.04 mg/dL).   Medical History: Past Medical History:  Diagnosis Date  . Aortic stenosis   . Arthritis   . BPH (benign prostatic hyperplasia)   . Chronic low back pain   . Depression   . Diverticulitis   . Elevated PSA   . Foot drop, right   . Gait disorder 02/03/2015  . Hip pain   . History of colonoscopy   . HOH (hard of hearing)    hearing aid  . Hypertension   . Lumbar radiculopathy   . Mitral valve prolapse   . Onychomycosis   . Peptic ulcer disease   . Sepsis ([place]) 2005   e. coli-after prostate bx  . Sleep apnea   . Torn ACL   . Tracheobronchomalacia    seen on 2019 Chest CT    Rober Minion, PharmD., MS Clinical Pharmacist  Thank you for allowing pharmacy to be part of this patients care team. 03/05/2020,7:51  PM

## 2020-03-05 NOTE — Progress Notes (Signed)
Chief Complaint  Patient presents with  . Follow-up    CAD   History of Present Illness: 86 yo male with history of CAD, aortic stenosis, HTN, mitral valve prolapse and sleep apnea here today for cardiac follow up. He was seen in our office for evaluation of aortic stenosis and abnormal EKG in May 2018. He was having a colonoscopy on 04/04/17 and was found to have an irregular heart rhythm. He was sent to his primary care office where an EKG showed sinus rhythm with PACs. Echo November 2017 with normal LV systolic function, WCHE=52-77%, grade 1 diastolic dysfunction, functionally bicuspid aortic valve with moderate stenosis (mean gradient 21 mmHg, AVA 1.4cm2). There was mild mitral regurgitation. At his first visit here in May 2018, he described dyspnea and less energy but no chest pain or dizziness. Nuclear stress test May 2018 with no ischemia. Echo July 2020 showed normal LV systolic function, mild aortic valve stenosis (mean gradient 17 mmHg), mild AI. He has been on ASA and a statin due to finding of CAD on the chest CT.   He is here today for follow up. He feels terrible. He has gained 10-15 lbs over the past two weeks. He has dyspnea with minimal exertion. He has lower extremity edema and abdominal bloating. He has no chest pain. No dizziness, near syncope or syncope. He has no awareness of his heart racing.   EKG today with atrial flutter with RVR.   Primary Care Physician: Lavone Orn, MD  Past Medical History:  Diagnosis Date  . Aortic stenosis   . Arthritis   . BPH (benign prostatic hyperplasia)   . Chronic low back pain   . Depression   . Diverticulitis   . Elevated PSA   . Foot drop, right   . Gait disorder 02/03/2015  . Hip pain   . History of colonoscopy   . HOH (hard of hearing)    hearing aid  . Hypertension   . Lumbar radiculopathy   . Mitral valve prolapse   . Onychomycosis   . Peptic ulcer disease   . Sepsis ([place]) 2005   e. coli-after prostate bx  . Sleep  apnea   . Torn ACL   . Tracheobronchomalacia    seen on 2019 Chest CT    Past Surgical History:  Procedure Laterality Date  . CATARACT EXTRACTION Bilateral   . ESOPHAGOGASTRODUODENOSCOPY N/A 08/19/2015   Procedure: ESOPHAGOGASTRODUODENOSCOPY (EGD);  Surgeon: Laurence Spates, MD;  Location: [facility] ENDOSCOPY;  Service: Endoscopy;  Laterality: N/A;  . right knee replacement Right 1996  . SHOULDER SURGERY    . TONSILLECTOMY    . TOTAL HIP ARTHROPLASTY Right 08/13/2019   Procedure: RIGHT TOTAL HIP ARTHROPLASTY ANTERIOR APPROACH;  Surgeon: Mcarthur Rossetti, MD;  Location: [place];  Service: Orthopedics;  Laterality: Right;    Current Outpatient Medications  Medication Sig Dispense Refill  . allopurinol (ZYLOPRIM) 100 MG tablet TAKE 1 TABLET ONCE DAILY. 90 tablet 0  . aspirin EC 81 MG tablet Take 81 mg by mouth daily.    [name] atorvastatin (LIPITOR) 10 MG tablet Take 10 mg by mouth daily.   10  . bisacodyl (DULCOLAX) 10 MG suppository Place 10 mg rectally 2 (two) times daily as needed for moderate constipation.    . colchicine 0.6 MG tablet Take 1 tablet (0.6 mg total) by mouth 2 (two) times daily as needed. (Patient taking differently: Take 0.6 mg by mouth 2 (two) times daily as needed (gout). ) 30 tablet  2  . dutasteride (AVODART) 0.5 MG capsule Take 0.5 mg by mouth daily.  2  . fluticasone furoate-vilanterol (BREO ELLIPTA) 100-25 MCG/INH AEPB Inhale 1 puff into the lungs daily. 2 each 0  . gabapentin (NEURONTIN) 300 MG capsule Take 1 capsule (300 mg total) by mouth 3 (three) times daily. (Patient taking differently: Take 300 mg by mouth at bedtime. ) 90 capsule 3  . indapamide (LOZOL) 2.5 MG tablet Take 2.5 mg by mouth daily.  1  . mirtazapine (REMERON) 30 MG tablet TAKE ONE TABLET AT BEDTIME. 90 tablet 0  . Multiple Vitamin (MULTIVITAMIN) tablet Take 1 tablet by mouth daily.    . Omega-3 Fatty Acids (FISH OIL PO) Take 1,000 mg by mouth daily.    . Probiotic Product (PROBIOTIC DAILY PO) Take 1  tablet by mouth daily.    [name] Respiratory Therapy Supplies (FLUTTER) DEVI Use the flutter valve 10 times when doing it daily. 1 each 0  . tamsulosin (FLOMAX) 0.4 MG CAPS capsule Take 0.4 mg by mouth daily.  2  . Tiotropium Bromide-Olodaterol (STIOLTO RESPIMAT) 2.5-2.5 MCG/ACT AERS Inhale 2 puffs into the lungs daily. 1 Inhaler 0  . Turmeric 500 MG CAPS Take 500 mg by mouth daily.    . verapamil (CALAN-SR) 240 MG CR tablet Take 240 mg by mouth daily.  2  . Vitamin D, Ergocalciferol, (DRISDOL) 1.25 MG (50000 UNIT) CAPS capsule TAKE 1 CAPSULE WEEKLY. 12 capsule 0   No current facility-administered medications for this visit.    No Known Allergies  Social History   Socioeconomic History  . Marital status: Married    Spouse name: Not on file  . Number of children: 2  . Years of education: Not on file  . Highest education level: Not on file  Occupational History  . Occupation: Retired Social research officer, government  Tobacco Use  . Smoking status: Former Smoker    Packs/day: 0.50    Types: Cigarettes    Start date: 1960    Quit date: 1961    Years since quitting: 60.3  . Smokeless tobacco: Never Used  . 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 Alcohol use: Not on file  . Drug use: No  . Sexual activity: Yes  Other Topics Concern  . Not on file  Social History Narrative   Patient is right handed.   Patient drinks 1 cup of coffee per day.   Social Determinants of Health   Financial Resource Strain:   . Difficulty of Paying Living Expenses:   Food Insecurity:   . Worried About RCharity fundraiserin the Last Year:   . RArboriculturistin the Last Year:   Transportation Needs:   . LFilm/video editor(Medical):   .Marland KitchenLack of Transportation (Non-Medical):   Physical Activity:   . Days of Exercise per Week:   . Minutes of Exercise per Session:   Stress:   . Feeling of Stress :   Social Connections:   . Frequency of Communication with Friends  and Family:   . Frequency of Social Gatherings with Friends and Family:   . Attends Religious Services:   . Active Member of Clubs or Organizations:   . Attends CArchivistMeetings:   .Marland KitchenMarital Status:   Intimate Partner Violence:   . Fear of Current or Ex-Partner:   . Emotionally Abused:   .Marland KitchenPhysically Abused:   . Sexually Abused:  Family History  Problem Relation Age of Onset  . Pneumonia Mother 66  . Cancer - Lung Father 49  . Multiple myeloma Brother 6    Review of Systems:  As stated in the HPI and otherwise negative.   BP 128/70   Pulse 70   Ht '5\' 10"'$  (1.778 m)   Wt 219 lb (99.3 kg)   SpO2 99%   BMI 31.42 kg/m   Physical Examination:  General: Well developed, well nourished, NAD  HEENT: OP clear, mucus membranes moist  SKIN: warm, dry. No rashes. Neuro: No focal deficits  Musculoskeletal: Muscle strength 5/5 all ext  Psychiatric: Mood and affect normal  Neck: No JVD, no carotid bruits, no thyromegaly, no lymphadenopathy.  Lungs:Clear bilaterally, no wheezes, rhonci, crackles Cardiovascular: Irregular irregular. Systolic murmur.  Abdomen:Soft. Bowel sounds present. Non-tender.  Extremities: 1+ bilateral lower extremity edema. Pulses are 2 + in the bilateral DP/PT.  Echo July 2020:  1. The left ventricle has hyperdynamic systolic function, with an  ejection fraction of >65%. The cavity size was normal. There is moderately  increased left ventricular wall thickness. Left ventricular diastolic  Doppler parameters are indeterminate. No  evidence of left ventricular regional wall motion abnormalities.  2. The right ventricle has normal systolic function. The cavity was  normal. There is no increase in right ventricular wall thickness.  3. Left atrial size was moderately dilated.  4. Right atrial size was mildly dilated.  5. Moderate mitral valve prolapse.  6. The mitral valve is abnormal. Mild thickening of the mitral valve  leaflet. There  is mild to moderate mitral annular calcification present.  7. The tricuspid valve is grossly normal.  8. The aortic valve is tricuspid. Mild calcification of the aortic valve.  Aortic valve regurgitation is trivial by color flow Doppler. Mild stenosis  of the aortic valve.  9. The aorta is normal in size and structure.   EKG:  EKG is ordered today. The ekg ordered today demonstrates Atrial flutter, Rate 110-130 bpm  Recent Labs: 10/04/2019: Hemoglobin 14.0; Platelets 245.0 02/19/2020: ALT 53; NT-Pro BNP 721 03/04/2020: BUN 24; Creatinine, Ser 1.04; Potassium 4.3; Sodium 139   Lipid Panel No results found for: CHOL, TRIG, HDL, CHOLHDL, VLDL, LDLCALC, LDLDIRECT   Wt Readings from Last 3 Encounters:  03/05/20 219 lb (99.3 kg)  02/19/20 219 lb (99.3 kg)  10/04/19 212 lb (96.2 kg)     Other studies Reviewed: Additional studies/ records that were reviewed today include: . Review of the above records demonstrates:   Assessment and Plan:   1. Acute on chronic diastolic CHF:  2. New onset atrial flutter with RVR 3. Moderate aortic stenosis 4. CAD without angina  He is volume overloaded and is now in atrial flutter with RVR. He is feeling poorly. I will admit him to [facility] for further management.  -Will plan to start IV Cardizem for rate control. Hold home verapamil -Will start IV heparin -Diuresis with Lasix 40 mg IV BID -Echo to assess aortic stenosis    Current medicines are reviewed at length with the patient today.  The patient does not have concerns regarding medicines.  The following changes have been made:  no change  Labs/ tests ordered today include:   No orders of the defined types were placed in this encounter.    Disposition:   FU with me in 12 months   Signed, [name], MD 03/05/2020 12:22 PM    [facility] [place],  , [place]  [address] Phone: [phone]; Fax: [phone]  

## 2020-03-05 NOTE — Progress Notes (Signed)
Patient arrived to unit. Pt is alert and oriented to self, place and time. Spouse is at bedside. CCMD called and tele placed.  PA paged to make aware of pt arrival.

## 2020-03-05 NOTE — Addendum Note (Signed)
Addended by: [name] D on: 03/05/2020 09:02 PM   Modules accepted: Level of Service

## 2020-03-05 NOTE — Patient Instructions (Signed)
Dr. Angelena Form recommends admission to the hospital today.  We have arranged for a direct admission to [facility] for today.  The admission department will contact you at [phone] when there is a bed ready for you.  It is okay to go home and wait for this call.    If you feel worse at home while waiting, please call our office for assistance or go to the Emergency Department at [facility].

## 2020-03-06 ENCOUNTER — Inpatient Hospital Stay (HOSPITAL_COMMUNITY): Payer: Medicare Other

## 2020-03-06 DIAGNOSIS — I350 Nonrheumatic aortic (valve) stenosis: Secondary | ICD-10-CM

## 2020-03-06 DIAGNOSIS — I340 Nonrheumatic mitral (valve) insufficiency: Secondary | ICD-10-CM

## 2020-03-06 DIAGNOSIS — I351 Nonrheumatic aortic (valve) insufficiency: Secondary | ICD-10-CM

## 2020-03-06 LAB — BASIC METABOLIC PANEL
Anion gap: 11 (ref 5–15)
BUN: 19 mg/dL (ref 8–23)
CO2: 29 mmol/L (ref 22–32)
Calcium: 9.4 mg/dL (ref 8.9–10.3)
Chloride: 102 mmol/L (ref 98–111)
Creatinine, Ser: 1.08 mg/dL (ref 0.61–1.24)
GFR calc Af Amer: >60 mL/min (ref >60)
GFR calc non Af Amer: >60 mL/min (ref >60)
Glucose, Bld: 105 mg/dL — ABNORMAL HIGH (ref 70–99)
Potassium: 3.9 mmol/L (ref 3.5–5.1)
Sodium: 142 mmol/L (ref 135–145)

## 2020-03-06 LAB — ECHOCARDIOGRAM COMPLETE
Height: 70 in
Weight: 3315.72 oz

## 2020-03-06 LAB — CBC
HCT: 41.1 % (ref 39.0–52.0)
Hemoglobin: 13.7 g/dL (ref 13.0–17.0)
MCH: 33.3 pg (ref 26.0–34.0)
MCHC: 33.3 g/dL (ref 30.0–36.0)
MCV: 100 fL (ref 80.0–100.0)
Platelets: 194 10*3/uL (ref 150–400)
RBC: 4.11 MIL/uL — ABNORMAL LOW (ref 4.22–5.81)
RDW: 14.4 % (ref 11.5–15.5)
WBC: 6.1 10*3/uL (ref 4.0–10.5)
nRBC: 0 % (ref 0.0–0.2)

## 2020-03-06 LAB — LIPID PANEL
Cholesterol: 96 mg/dL (ref 0–200)
HDL: 48 mg/dL (ref >40)
LDL Cholesterol: 38 mg/dL (ref 0–99)
Total CHOL/HDL Ratio: 2 RATIO
Triglycerides: 50 mg/dL (ref <150)
VLDL: 10 mg/dL (ref 0–40)

## 2020-03-06 LAB — MAGNESIUM: Magnesium: 1.9 mg/dL (ref 1.7–2.4)

## 2020-03-06 LAB — BRAIN NATRIURETIC PEPTIDE: B Natriuretic Peptide: 355.3 pg/mL — ABNORMAL HIGH (ref 0.0–100.0)

## 2020-03-06 LAB — HEPARIN LEVEL (UNFRACTIONATED)
Heparin Unfractionated: 0.44 IU/mL (ref 0.30–0.70)
Heparin Unfractionated: 0.49 IU/mL (ref 0.30–0.70)

## 2020-03-06 MED ORDER — MAGNESIUM OXIDE 400 (241.3 MG) MG PO TABS
400.0000 mg | ORAL_TABLET | Freq: Once | ORAL | Status: AC
  Administered 2020-03-06: 17:00:00 400 mg via ORAL
  Filled 2020-03-06: qty 1

## 2020-03-06 MED ORDER — APIXABAN 5 MG PO TABS
5.0000 mg | ORAL_TABLET | Freq: Two times a day (BID) | ORAL | Status: DC
  Administered 2020-03-07 – 2020-03-08 (×4): 5 mg via ORAL
  Filled 2020-03-06 (×4): qty 1

## 2020-03-06 MED ORDER — POTASSIUM CHLORIDE CRYS ER 20 MEQ PO TBCR
20.0000 meq | EXTENDED_RELEASE_TABLET | Freq: Once | ORAL | Status: AC
  Administered 2020-03-06: 17:00:00 20 meq via ORAL
  Filled 2020-03-06: qty 1

## 2020-03-06 NOTE — Progress Notes (Signed)
  Echocardiogram 2D Echocardiogram has been performed.  Patrick Franco 03/06/2020, 4:56 PM

## 2020-03-06 NOTE — Progress Notes (Addendum)
ANTICOAGULATION CONSULT NOTE - Follow Up Consult  Pharmacy Consult for Heparin Indication: Atrial flutter  No Known Allergies  Patient Measurements: Height: 5\' 10"  (177.8 cm) Weight: 94 kg (207 lb 3.7 oz) IBW/kg (Calculated) : 73 Heparin Dosing Weight: 92.8 kg  Vital Signs: Temp: 97.4 F (36.3 C) (04/23 0750) Temp Source: Oral (04/23 0750) BP: 140/93 (04/23 0750) Pulse Rate: 67 (04/23 1210)  Labs: Recent Labs    03/04/20 1032 03/05/20 1938 03/06/20 0330 03/06/20 1153  HGB  --  13.9 13.7  --   HCT  --  42.4 41.1  --   PLT  --  208 194  --   HEPARINUNFRC  --   --  0.44 0.49  CREATININE 1.04 1.06 1.08  --     Estimated Creatinine Clearance: 57.6 mL/min (by C-G formula based on SCr of 1.08 mg/dL).  Assessment: 86yo with multiple medical problems including mitral valve prolapse sent to the hospital by his primary physician for follow up. Pharmacy consulted to dose heparin for new A-Flutter.  Heparin level therapeutic this afternoon. CBC stable. No issues with infusion or bleeding  Goal of Therapy:  Heparin level 0.3-0.7 units/ml Monitor platelets by anticoagulation protocol: Yes   Plan:  Continue heparin at 1300 units/hour Daily heparin and CBC Monitor for s/sx's of bleeding       L. Devin Going, [place] PGY1 Pharmacy Resident [phone] 03/06/20      2:35 PM  Please check AMION for all [place] phone numbers After 10:00 PM, call the [place] [phone]

## 2020-03-06 NOTE — Progress Notes (Signed)
[place] for Heparin>>>>Apixaban  Indication: Aflutter  No Known Allergies  Patient Measurements: Height: 5\' 10"  (177.8 cm) Weight: 94 kg (207 lb 3.7 oz) IBW/kg (Calculated) : 73 Heparin Dosing Weight: 82.3  Assessment: 87yo with multiple medical problems including mitral valve prolapse sent to the hospital by his primary physician for follow up.  We have been asked to start IV heparin for A-flutter.  Labs ordered but CBC has not resulted yet - will confirm once available.  No current noted bleeding complications.  He has not been taking any anticoagulation prior to admit.  4/23 PM update:  Transitioning from Heparin to Apixaban per cardiology  Renal function good  Goal of Therapy:  Monitor platelets by anticoagulation protocol: Yes   Plan:  -DC heparin drip  -Start Apixaban 5 mg BID -Daily CBC -Monitor for bleeding  Vital Signs: Temp: 97.6 F (36.4 C) (04/23 1944) Temp Source: Oral (04/23 1944) BP: 130/60 (04/23 1944) Pulse Rate: 65 (04/23 1944)  Labs: Recent Labs    03/04/20 1032 03/05/20 1938 03/06/20 0330 03/06/20 1153  HGB  --  13.9 13.7  --   HCT  --  42.4 41.1  --   PLT  --  208 194  --   HEPARINUNFRC  --   --  0.44 0.49  CREATININE 1.04 1.06 1.08  --     Estimated Creatinine Clearance: 57.6 mL/min (by C-G formula based on SCr of 1.08 mg/dL).  Narda Bonds, PharmD, BCPS Clinical Pharmacist Phone: [phone]

## 2020-03-06 NOTE — Progress Notes (Signed)
[place] for Heparin Indication: Aflutter  No Known Allergies  Patient Measurements: Height: 5\' 10"  (177.8 cm) Weight: 94 kg (207 lb 3.2 oz) IBW/kg (Calculated) : 73 Heparin Dosing Weight: 82.3  Assessment: 86yo with multiple medical problems including mitral valve prolapse sent to the hospital by his primary physician for follow up.  We have been asked to start IV heparin for A-flutter.  Labs ordered but CBC has not resulted yet - will confirm once available.  No current noted bleeding complications.  He has not been taking any anticoagulation prior to admit.  4/23 AM update:  Initial heparin level therapeutic   Goal of Therapy:  Heparin level 0.3-0.7 units/ml Monitor platelets by anticoagulation protocol: Yes   Plan:  -Cont heparin at 1300 units/hr -Confirmatory heparin level at 1200   Vital Signs: Temp: 97.4 F (36.3 C) (04/23 0103) Temp Source: Oral (04/23 0103) BP: 141/87 (04/23 0103) Pulse Rate: 66 (04/23 0103)  Labs: Recent Labs    03/04/20 1032 03/05/20 1938 03/06/20 0330  HGB  --  13.9 13.7  HCT  --  42.4 41.1  PLT  --  208 194  HEPARINUNFRC  --   --  0.44  CREATININE 1.04 1.06 1.08    Estimated Creatinine Clearance: 57.6 mL/min (by C-G formula based on SCr of 1.08 mg/dL).   Medical History: Past Medical History:  Diagnosis Date  . Aortic stenosis   . Arthritis   . BPH (benign prostatic hyperplasia)   . Chronic low back pain   . Depression   . Diverticulitis   . Elevated PSA   . Foot drop, right   . Gait disorder 02/03/2015  . Hip pain   . History of colonoscopy   . HOH (hard of hearing)    hearing aid  . Hypertension   . Lumbar radiculopathy   . Mitral valve prolapse   . Onychomycosis   . Peptic ulcer disease   . Sepsis ([place]) 2005   e. coli-after prostate bx  . Sleep apnea   . Torn ACL   . Tracheobronchomalacia    seen on 2019 Chest CT   Narda Bonds, PharmD, BCPS Clinical  Pharmacist Phone: [phone]

## 2020-03-06 NOTE — Progress Notes (Addendum)
Progress Note  Patient Name: Patrick Franco Date of Encounter: 03/06/2020  Primary Cardiologist: [name], MD   Subjective   Pt is unaware of his rhythm. He reports a 10 lbs weight gain over the last month and sporadic heart rate changes while exercising on a recumbent bike.   Inpatient Medications    Scheduled Meds: . allopurinol  100 mg Oral Daily  . aspirin EC  81 mg Oral Daily  . atorvastatin  10 mg Oral Daily  . dutasteride  0.5 mg Oral Daily  . fluticasone furoate-vilanterol  1 puff Inhalation Daily  . furosemide  40 mg Intravenous BID  . gabapentin  300 mg Oral QHS  . mirtazapine  30 mg Oral QHS  . tamsulosin  0.4 mg Oral Daily  . Tiotropium Bromide-Olodaterol  2 puff Inhalation Daily  . verapamil  240 mg Oral Daily   Continuous Infusions: . heparin 1,300 Units/hr (03/05/20 2222)   PRN Meds: acetaminophen, bisacodyl, ondansetron (ZOFRAN) IV   Vital Signs    Vitals:   03/06/20 0103 03/06/20 0500 03/06/20 0501 03/06/20 0750  BP: (!) 141/87  122/83 (!) 140/93  Pulse: 66  65 65  Resp: 18  16 15   Temp: (!) 97.4 F (36.3 C)  98.1 F (36.7 C) (!) 97.4 F (36.3 C)  TempSrc: Oral  Oral Oral  SpO2: 95%  99% 94%  Weight:  94 kg    Height:        Intake/Output Summary (Last 24 hours) at 03/06/2020 1011 Last data filed at 03/06/2020 0448 Gross per 24 hour  Intake 107.91 ml  Output 2540 ml  Net -2432.09 ml   Last 3 Weights 03/06/2020 03/06/2020 03/05/2020  Weight (lbs) 207 lb 3.7 oz 207 lb 3.2 oz 212 lb 4.8 oz  Weight (kg) 94 kg 93.985 kg 96.299 kg      Telemetry    Appears to be atrial flutter with controlled ventricular response in the 60s - Personally Reviewed  ECG    Atrial flutter 4:1 with ventricular rate 68 - Personally Reviewed  Physical Exam   GEN: No acute distress.   Neck: No JVD Cardiac: RRR, no murmurs, rubs, or gallops.  Respiratory: Clear to auscultation bilaterally. GI: Soft, nontender, non-distended  MS: mild B LE edema; No  deformity. Neuro:  Nonfocal  Psych: Normal affect   Labs    High Sensitivity Troponin:  No results for input(s): TROPONINIHS in the last 720 hours.    Chemistry Recent Labs  Lab 03/04/20 1032 03/05/20 1938 03/06/20 0330  NA 139 134* 142  K 4.3 3.9 3.9  CL 103 99 102  CO2 28 21* 29  GLUCOSE 111* 178* 105*  BUN 24* 20 19  CREATININE 1.04 1.06 1.08  CALCIUM 9.3 8.8* 9.4  PROT  --  6.0*  --   ALBUMIN  --  3.7  --   AST  --  51*  --   ALT  --  71*  --   ALKPHOS  --  49  --   BILITOT  --  1.5*  --   GFRNONAA  --  >60 >60  GFRAA  --  >60 >60  ANIONGAP  --  14 11     Hematology Recent Labs  Lab 03/05/20 1938 03/06/20 0330  WBC 6.2 6.1  RBC 4.27 4.11*  HGB 13.9 13.7  HCT 42.4 41.1  MCV 99.3 100.0  MCH 32.6 33.3  MCHC 32.8 33.3  RDW 14.5 14.4  PLT 208 194  BNP Recent Labs  Lab 03/05/20 1938  BNP 355.3*     DDimer No results for input(s): DDIMER in the last 168 hours.   Radiology    DG Chest 2 View  Result Date: 03/05/2020 CLINICAL DATA:  Dyspnea on exertion, weight gain, lower extremity edema EXAM: CHEST - 2 VIEW COMPARISON:  02/19/2020 FINDINGS: Frontal and lateral views of the chest demonstrates stable enlargement of the cardiac silhouette. Lungs are hyperinflated with background interstitial prominence consistent with emphysema. There is bibasilar scarring unchanged. No airspace disease, effusion, or pneumothorax. No acute bony abnormality. IMPRESSION: 1. Stable emphysema.  No acute process. Electronically Signed   By: Randa Ngo M.D.   On: 03/05/2020 20:17    Cardiac Studies   Echo pending today  Echo 06/12/19: 1. The left ventricle has hyperdynamic systolic function, with an  ejection fraction of >65%. The cavity size was normal. There is moderately  increased left ventricular wall thickness. Left ventricular diastolic  Doppler parameters are indeterminate. No  evidence of left ventricular regional wall motion abnormalities.  2. The right  ventricle has normal systolic function. The cavity was  normal. There is no increase in right ventricular wall thickness.  3. Left atrial size was moderately dilated.  4. Right atrial size was mildly dilated.  5. Moderate mitral valve prolapse.  6. The mitral valve is abnormal. Mild thickening of the mitral valve  leaflet. There is mild to moderate mitral annular calcification present.  7. The tricuspid valve is grossly normal.  8. The aortic valve is tricuspid. Mild calcification of the aortic valve.  Aortic valve regurgitation is trivial by color flow Doppler. Mild stenosis  of the aortic valve.  9. The aorta is normal in size and structure.   Patient Profile     86 y.o. male with CAD on CT chest, aortic stenosis, HTN, mitral valve prolapse andsleep apnea. Reports a 10 lbs weight gain in the last month. No awareness of atrial arrhythmia. He is very active, but stopped playing tennis last year due to changing balance issues. No recent falls.  Assessment & Plan    Acute on chronic diastolic heart failure - echo 05/2019 with HFpEF - repeat echo pending - presented in clinic with weight gain and dyspnea - admitted for diuresis - 40 mg IV lasix BID - has diuresed 2.5 L urine yesterday - weight is 207 lbs down from 212 on admission   New onset atrial flutter with RVR - rate was controlled on arrival, no rate controlling agents - This patients CHA2DS2-VASc Score and unadjusted Ischemic Stroke Rate (% per year) is equal to 7.2 % stroke rate/year from a score of 5 (HTN, CAD, 2age, CHF) - heparin drip - appears to be in 4:1 flutter - will review with attending - will plan to diurese over the weekend with possible TEE-guided cardioversion on Monday - TSH WNL - K 3.9, Mg 1.9   Hypertension - continue verapamil   Hyperlipidemia - 03/06/2020: Cholesterol 96; HDL 48; LDL Cholesterol 38; Triglycerides 50; VLDL 10 - continue 10 mg lipitor   CAD seen on CT chest - normal stress  test 03/2017 - continue ASA and statin   Aortic stenosis - mild on echo last year - repeat echo pending     For questions or updates, please contact [facility] Please consult [email or web address] for contact info under        Signed, Ledora Bottcher, PA  03/06/2020, 10:11 AM    Patient seen and examined.  Agree  with above documentation.On exam, patient is alert and oriented, regular rate and rhythm, 2/6 systolic murmur loudest at RUSB, lungs with minimal bibasilar crackles, 1+ LE edema.  Net -2.4 L yesterday on IV Lasix 40 mg twice daily.  Remains in rate controlled atrial flutter.  Will continue IV diuresis.  Continue verapamil for rate control.  Will follow up echo.  Continue IV heparin for anticoagulation; provided aortic stenosis is stable and no procedures planned, will plan to switch to Eliquis for anticoagulation.    Donato Heinz, MD

## 2020-03-07 DIAGNOSIS — I340 Nonrheumatic mitral (valve) insufficiency: Secondary | ICD-10-CM

## 2020-03-07 DIAGNOSIS — I2510 Atherosclerotic heart disease of native coronary artery without angina pectoris: Secondary | ICD-10-CM

## 2020-03-07 DIAGNOSIS — I341 Nonrheumatic mitral (valve) prolapse: Secondary | ICD-10-CM

## 2020-03-07 LAB — CBC
HCT: 46 % (ref 39.0–52.0)
Hemoglobin: 14.7 g/dL (ref 13.0–17.0)
MCH: 32.6 pg (ref 26.0–34.0)
MCHC: 32 g/dL (ref 30.0–36.0)
MCV: 102 fL — ABNORMAL HIGH (ref 80.0–100.0)
Platelets: 196 10*3/uL (ref 150–400)
RBC: 4.51 MIL/uL (ref 4.22–5.81)
RDW: 14.4 % (ref 11.5–15.5)
WBC: 6 10*3/uL (ref 4.0–10.5)
nRBC: 0 % (ref 0.0–0.2)

## 2020-03-07 LAB — BASIC METABOLIC PANEL
Anion gap: 10 (ref 5–15)
BUN: 24 mg/dL — ABNORMAL HIGH (ref 8–23)
CO2: 31 mmol/L (ref 22–32)
Calcium: 9 mg/dL (ref 8.9–10.3)
Chloride: 99 mmol/L (ref 98–111)
Creatinine, Ser: 1.25 mg/dL — ABNORMAL HIGH (ref 0.61–1.24)
GFR calc Af Amer: >60 mL/min (ref >60)
GFR calc non Af Amer: 52 mL/min — ABNORMAL LOW (ref >60)
Glucose, Bld: 111 mg/dL — ABNORMAL HIGH (ref 70–99)
Potassium: 3.6 mmol/L (ref 3.5–5.1)
Sodium: 140 mmol/L (ref 135–145)

## 2020-03-07 LAB — MAGNESIUM: Magnesium: 1.9 mg/dL (ref 1.7–2.4)

## 2020-03-07 MED ORDER — VERAPAMIL HCL ER 120 MG PO TBCR
120.0000 mg | EXTENDED_RELEASE_TABLET | Freq: Once | ORAL | Status: AC
  Administered 2020-03-07: 14:00:00 120 mg via ORAL
  Filled 2020-03-07: qty 1

## 2020-03-07 MED ORDER — VERAPAMIL HCL ER 180 MG PO TBCR
360.0000 mg | EXTENDED_RELEASE_TABLET | Freq: Every day | ORAL | Status: DC
  Administered 2020-03-08: 09:00:00 360 mg via ORAL
  Filled 2020-03-07: qty 2

## 2020-03-07 MED ORDER — POTASSIUM CHLORIDE CRYS ER 20 MEQ PO TBCR
40.0000 meq | EXTENDED_RELEASE_TABLET | Freq: Once | ORAL | Status: AC
  Administered 2020-03-07: 40 meq via ORAL
  Filled 2020-03-07: qty 2

## 2020-03-07 MED ORDER — MAGNESIUM SULFATE 2 GM/50ML IV SOLN
2.0000 g | Freq: Once | INTRAVENOUS | Status: AC
  Administered 2020-03-07: 12:00:00 2 g via INTRAVENOUS
  Filled 2020-03-07: qty 50

## 2020-03-07 MED ORDER — FUROSEMIDE 40 MG PO TABS
40.0000 mg | ORAL_TABLET | Freq: Every day | ORAL | Status: DC
  Administered 2020-03-08: 09:00:00 40 mg via ORAL
  Filled 2020-03-07: qty 1

## 2020-03-07 NOTE — Progress Notes (Signed)
Progress Note  Patient Name: Patrick Franco Date of Encounter: 03/07/2020  Primary Cardiologist: [name], MD   Subjective   Feels well at rest.  Prior to admission all his symptoms are exercise related.  He has not walked since admission. Telemetry shows atrial flutter with 4: 1 AV conduction.  Inpatient Medications    Scheduled Meds: . allopurinol  100 mg Oral Daily  . apixaban  5 mg Oral BID  . aspirin EC  81 mg Oral Daily  . atorvastatin  10 mg Oral Daily  . dutasteride  0.5 mg Oral Daily  . fluticasone furoate-vilanterol  1 puff Inhalation Daily  . furosemide  40 mg Intravenous BID  . gabapentin  300 mg Oral QHS  . mirtazapine  30 mg Oral QHS  . potassium chloride  40 mEq Oral Once  . tamsulosin  0.4 mg Oral Daily  . Tiotropium Bromide-Olodaterol  2 puff Inhalation Daily  . verapamil  240 mg Oral Daily  . verapamil  120 mg Oral Once  . [START ON 03/08/2020] verapamil  360 mg Oral Daily   Continuous Infusions: . magnesium sulfate bolus IVPB     PRN Meds: acetaminophen, bisacodyl, ondansetron (ZOFRAN) IV   Vital Signs    Vitals:   03/07/20 0401 03/07/20 0402 03/07/20 0405 03/07/20 0818  BP: 113/76 113/76    Pulse: 66 67    Resp:  16    Temp: (!) 97.5 F (36.4 C) (!) 97.5 F (36.4 C)    TempSrc: Oral Oral    SpO2: 92% 91%  95%  Weight:   93.3 kg   Height:        Intake/Output Summary (Last 24 hours) at 03/07/2020 1203 Last data filed at 03/07/2020 1200 Gross per 24 hour  Intake 480 ml  Output 2650 ml  Net -2170 ml   Last 3 Weights 03/07/2020 03/06/2020 03/06/2020  Weight (lbs) 205 lb 9.6 oz 207 lb 3.7 oz 207 lb 3.2 oz  Weight (kg) 93.26 kg 94 kg 93.985 kg      Telemetry    Atrial flutter with 4: 1 AV conduction- Personally Reviewed  ECG    Atrial flutter with variable AV block; not sure that this is typical atrial flutter - Personally Reviewed  Physical Exam  Mildly obese GEN: No acute distress.   Neck: No JVD Cardiac: RRR, 2/6  early peaking systolic ejection murmur in the aortic focus very faint late systolic apical murmur, no diastolic murmurs, rubs, or gallops.  Respiratory: Clear to auscultation bilaterally. GI: Soft, nontender, non-distended  MS: No edema; No deformity. Neuro:  Nonfocal  Psych: Normal affect   Labs    High Sensitivity Troponin:  No results for input(s): TROPONINIHS in the last 720 hours.    Chemistry Recent Labs  Lab 03/05/20 1938 03/06/20 0330 03/07/20 0516  NA 134* 142 140  K 3.9 3.9 3.6  CL 99 102 99  CO2 21* 29 31  GLUCOSE 178* 105* 111*  BUN 20 19 24*  CREATININE 1.06 1.08 1.25*  CALCIUM 8.8* 9.4 9.0  PROT 6.0*  --   --   ALBUMIN 3.7  --   --   AST 51*  --   --   ALT 71*  --   --   ALKPHOS 49  --   --   BILITOT 1.5*  --   --   GFRNONAA >60 >60 52*  GFRAA >60 >60 >60  ANIONGAP _0 Hematology Recent  Labs  Lab 03/05/20 1938 03/06/20 0330 03/07/20 0516  WBC 6.2 6.1 6.0  RBC 4.27 4.11* 4.51  HGB 13.9 13.7 14.7  HCT 42.4 41.1 46.0  MCV 99.3 100.0 102.0*  MCH 32.6 33.3 32.6  MCHC 32.8 33.3 32.0  RDW 14.5 14.4 14.4  PLT 208 194 196    BNP Recent Labs  Lab 03/05/20 1938  BNP 355.3*     DDimer No results for input(s): DDIMER in the last 168 hours.   Radiology    DG Chest 2 View  Result Date: 03/05/2020 CLINICAL DATA:  Dyspnea on exertion, weight gain, lower extremity edema EXAM: CHEST - 2 VIEW COMPARISON:  02/19/2020 FINDINGS: Frontal and lateral views of the chest demonstrates stable enlargement of the cardiac silhouette. Lungs are hyperinflated with background interstitial prominence consistent with emphysema. There is bibasilar scarring unchanged. No airspace disease, effusion, or pneumothorax. No acute bony abnormality. IMPRESSION: 1. Stable emphysema.  No acute process. Electronically Signed   By: Randa Ngo M.D.   On: 03/05/2020 20:17   ECHOCARDIOGRAM COMPLETE  Result Date: 03/06/2020    ECHOCARDIOGRAM REPORT   Patient Name:   Patrick Franco Date of Exam: 03/06/2020 Medical Rec #:  [id number]   Height:       70.0 in Accession #:    [id number]  Weight:       207.2 lb Date of Birth:  22-Jan-1932   BSA:          2.119 m Patient Age:    86 years    BP:           140/93 mmHg Patient Gender: M           HR:           67 bpm. Exam Location:  Inpatient Procedure: 2D Echo, Cardiac Doppler and Color Doppler Indications:    Dyspnea 786.09/R06.00  History:        Patient has prior history of Echocardiogram examinations, most                 recent 06/12/2019. Mitral Valve Disease and Aortic Valve Disease,                 Arrythmias:Atrial Flutter; Risk Factors:Sleep Apnea and Former                 Smoker. Mitral valve prolapse.  Sonographer:    Clayton Lefort RDCS (AE) Referring Phys: [id number] Abigail Butts  Sonographer Comments: No subcostal window and patient is morbidly obese. IMPRESSIONS  1. Left ventricular ejection fraction, by estimation, is 60 to 65%. The left ventricle has normal function. The left ventricle has no regional wall motion abnormalities. There is severe left ventricular hypertrophy. Left ventricular diastolic parameters  are indeterminate.  2. Right ventricular systolic function is normal. The right ventricular size is normal.  3. Left atrial size was severely dilated.  4. Right atrial size was moderately dilated.  5. There is mild bi lealfet prolapse with exhuberant MACwith some of the posterior leaflet MAC prolapsing into the LA. The mitral valve is degenerative. Mild mitral valve regurgitation. No evidence of mitral stenosis.  6. Recorded gradients appear lower than those obtained 06/12/19 when mean was 17 and peak 32 mmHg . The aortic valve was not well visualized. Aortic valve regurgitation is mild. Mild aortic valve stenosis.  7. The inferior vena cava is dilated in size with >50% respiratory variability, suggesting right atrial pressure of 8 mmHg. FINDINGS  Left Ventricle: Left  ventricular ejection fraction, by estimation, is 60 to 65%.  The left ventricle has normal function. The left ventricle has no regional wall motion abnormalities. The left ventricular internal cavity size was normal in size. There is  severe left ventricular hypertrophy. Left ventricular diastolic parameters are indeterminate. Right Ventricle: The right ventricular size is normal. No increase in right ventricular wall thickness. Right ventricular systolic function is normal. Left Atrium: Left atrial size was severely dilated. Right Atrium: Right atrial size was moderately dilated. Pericardium: There is no evidence of pericardial effusion. Mitral Valve: There is mild bi lealfet prolapse with exhuberant MACwith some of the posterior leaflet MAC prolapsing into the LA. The mitral valve is degenerative in appearance. There is severe thickening of the mitral valve leaflet(s). There is severe calcification of the mitral valve leaflet(s). Normal mobility of the mitral valve leaflets. Severe mitral annular calcification. Mild mitral valve regurgitation. No evidence of mitral valve stenosis. Tricuspid Valve: The tricuspid valve is normal in structure. Tricuspid valve regurgitation is not demonstrated. No evidence of tricuspid stenosis. Aortic Valve: Recorded gradients appear lower than those obtained 06/12/19 when mean was 17 and peak 32 mmHg. The aortic valve was not well visualized. . There is severe thickening and severe calcifcation of the aortic valve. Aortic valve regurgitation is  mild. Mild aortic stenosis is present. There is severe thickening of the aortic valve. There is severe calcifcation of the aortic valve. Aortic valve mean gradient measures 12.6 mmHg. Aortic valve peak gradient measures 22.3 mmHg. Aortic valve area, by VTI measures 1.19 cm. Pulmonic Valve: The pulmonic valve was normal in structure. Pulmonic valve regurgitation is not visualized. No evidence of pulmonic stenosis. Aorta: The aortic root is normal in size and structure. Venous: The inferior vena cava is  dilated in size with greater than 50% respiratory variability, suggesting right atrial pressure of 8 mmHg. IAS/Shunts: No atrial level shunt detected by color flow Doppler.  LEFT VENTRICLE PLAX 2D LVIDd:         3.97 cm LVIDs:         2.42 cm LV PW:         2.17 cm LV IVS:        1.86 cm LVOT diam:     2.10 cm LV SV:         55 LV SV Index:   26 LVOT Area:     3.46 cm  RIGHT VENTRICLE             IVC RV Basal diam:  3.93 cm     IVC diam: 2.84 cm RV Mid diam:    3.58 cm RV S prime:     14.50 cm/s TAPSE (M-mode): 2.0 cm LEFT ATRIUM            Index       RIGHT ATRIUM           Index LA diam:      6.20 cm  2.93 cm/m  RA Area:     26.50 cm LA Vol (A2C): 111.0 ml 52.38 ml/m RA Volume:   81.20 ml  38.32 ml/m LA Vol (A4C): 170.0 ml 80.22 ml/m  AORTIC VALVE AV Area (Vmax):    1.13 cm AV Area (Vmean):   1.07 cm AV Area (VTI):     1.19 cm AV Vmax:           236.20 cm/s AV Vmean:          166.200 cm/s AV VTI:  0.464 m AV Peak Grad:      22.3 mmHg AV Mean Grad:      12.6 mmHg LVOT Vmax:         76.90 cm/s LVOT Vmean:        51.420 cm/s LVOT VTI:          0.160 m LVOT/AV VTI ratio: 0.34  AORTA Ao Asc diam: 3.30 cm MR Peak grad:    112.8 mmHg MR Mean grad:    76.0 mmHg   SHUNTS MR Vmax:         531.00 cm/s Systemic VTI:  0.16 m MR Vmean:        414.0 cm/s  Systemic Diam: 2.10 cm MR PISA:         2.26 cm MR PISA Eff ROA: 13 mm MR PISA Radius:  0.60 cm Jenkins Rouge MD Electronically signed by Jenkins Rouge MD Signature Date/Time: 03/06/2020/5:19:41 PM    Final     Cardiac Studies  Echocardiogram 03/06/2020  1. Left ventricular ejection fraction, by estimation, is 60 to 65%. The  left ventricle has normal function. The left ventricle has no regional  wall motion abnormalities. There is severe left ventricular hypertrophy.  Left ventricular diastolic parameters  are indeterminate.  2. Right ventricular systolic function is normal. The right ventricular  size is normal.  3. Left atrial size was  severely dilated.  4. Right atrial size was moderately dilated.  5. There is mild bi lealfet prolapse with exhuberant MACwith some of the  posterior leaflet MAC prolapsing into the LA. The mitral valve is  degenerative. Mild mitral valve regurgitation. No evidence of mitral  stenosis.  6. Recorded gradients appear lower than those obtained 06/12/19 when mean  was 17 and peak 32 mmHg . The aortic valve was not well visualized. Aortic  valve regurgitation is mild. Mild aortic valve stenosis.  7. The inferior vena cava is dilated in size with >50% respiratory  variability, suggesting right atrial pressure of 8 mmHg.   Patient Profile     86 y.o. male with CAD, longstanding mitral valve prolapse with mild MR and mild degenerative aortic stenosis served LVEF presents with exertional dyspnea and tachycardia, found to have atrial flutter with variable AV block.  Assessment & Plan    1. CHF: Appears to have acute decompensation of diastolic heart failure due to atrial flutter.  Excellent diuresis since admission, now net negative over 5 L.  BUN and creatinine started to rise.  Switch to oral diuretics. 2. Atrial flutter: Probably atypical left atrial flutter based on ECG tracings.  Easy to rate control at rest, but promptly increases with exercise.  Will increase the dose of verapamil (which she has taken for years) to 360 mg daily.  He has started on Eliquis.  If he remains symptomatic with activity, we will keep him here till Monday for TEE guided cardioversion.  If symptoms are well controlled with the increased verapamil dose, will bring back for elective cardioversion as an outpatient after a minimum of 3 weeks of oral anticoagulant.  Has severe left atrial dilation and moderate right atrial dilation.  Arrhythmia recurrence is likely.  Reviewed the tracings with Dr. Lovena Le: suspect left atrial flutter which will be less amenable to ablation. TEE would offer additional opportunity to evaluate the  mitral valve pathology, to make sure we are not missing more severe MR that has caused severe left atrial dilation. 3. CAD: He has not had angina even when tachycardic. 4. AS: Mild,  has not progressed since last year. 5. MVP: Appears to have significant myxomatous/degenerative changes, but only mild MR.  For questions or updates, please contact [place] Please consult [email or web address] for contact info under        Signed, Sanda Klein, MD  03/07/2020, 12:03 PM

## 2020-03-07 NOTE — Progress Notes (Signed)
Pt ambulated in the hallway at room air with 95% oxygen saturation. Pt denied chest pain or pain. On arrival to room pt stated feeling " a little bit short of breath", oxygen saturation of 96% at room air.  pt's spouse at bedside.     03/07/20 1700  Mobility  Activity Ambulated in hall  Range of Motion/Exercises Active;All extremities  Level of Assistance Minimal assist, patient does 75% or more  Assistive Device None  Distance Ambulated (ft) 200 ft  Mobility Response Tolerated well  Mobility performed by Nurse

## 2020-03-07 NOTE — Plan of Care (Signed)
  Problem: Education: Goal: Knowledge of General Education information will improve Description: Including pain rating scale, medication(s)/side effects and non-pharmacologic comfort measures Outcome: Progressing   Problem: Health Behavior/Discharge Planning: Goal: Ability to manage health-related needs will improve Outcome: Progressing   Problem: Clinical Measurements: Goal: Ability to maintain clinical measurements within normal limits will improve Outcome: Progressing   Problem: Safety: Goal: Ability to remain free from injury will improve Outcome: Progressing   Problem: Cardiac: Goal: Ability to achieve and maintain adequate cardiopulmonary perfusion will improve Outcome: Progressing

## 2020-03-07 NOTE — Progress Notes (Signed)
SATURATION QUALIFICATIONS: (This note is used to comply with regulatory documentation for home oxygen)  Patient Saturations on Room Air at Rest = 98%  Patient Saturations on Room Air while Ambulating = 95%   

## 2020-03-08 DIAGNOSIS — I5030 Unspecified diastolic (congestive) heart failure: Secondary | ICD-10-CM

## 2020-03-08 DIAGNOSIS — I2510 Atherosclerotic heart disease of native coronary artery without angina pectoris: Secondary | ICD-10-CM

## 2020-03-08 DIAGNOSIS — I5031 Acute diastolic (congestive) heart failure: Secondary | ICD-10-CM

## 2020-03-08 DIAGNOSIS — I359 Nonrheumatic aortic valve disorder, unspecified: Secondary | ICD-10-CM

## 2020-03-08 LAB — CBC
HCT: 42.2 % (ref 39.0–52.0)
Hemoglobin: 14 g/dL (ref 13.0–17.0)
MCH: 33 pg (ref 26.0–34.0)
MCHC: 33.2 g/dL (ref 30.0–36.0)
MCV: 99.5 fL (ref 80.0–100.0)
Platelets: 198 10*3/uL (ref 150–400)
RBC: 4.24 MIL/uL (ref 4.22–5.81)
RDW: 14.3 % (ref 11.5–15.5)
WBC: 5.8 10*3/uL (ref 4.0–10.5)
nRBC: 0 % (ref 0.0–0.2)

## 2020-03-08 LAB — BASIC METABOLIC PANEL
Anion gap: 10 (ref 5–15)
BUN: 27 mg/dL — ABNORMAL HIGH (ref 8–23)
CO2: 26 mmol/L (ref 22–32)
Calcium: 9 mg/dL (ref 8.9–10.3)
Chloride: 102 mmol/L (ref 98–111)
Creatinine, Ser: 1.08 mg/dL (ref 0.61–1.24)
GFR calc Af Amer: >60 mL/min (ref >60)
GFR calc non Af Amer: >60 mL/min (ref >60)
Glucose, Bld: 104 mg/dL — ABNORMAL HIGH (ref 70–99)
Potassium: 3.8 mmol/L (ref 3.5–5.1)
Sodium: 138 mmol/L (ref 135–145)

## 2020-03-08 LAB — MAGNESIUM: Magnesium: 2.1 mg/dL (ref 1.7–2.4)

## 2020-03-08 MED ORDER — VERAPAMIL HCL ER 180 MG PO TBCR: 360.0000 mg | EXTENDED_RELEASE_TABLET | Freq: Every day | ORAL | 2 refills | Status: DC

## 2020-03-08 MED ORDER — FUROSEMIDE 40 MG PO TABS: 40.0000 mg | ORAL_TABLET | Freq: Every day | ORAL | 2 refills | Status: DC

## 2020-03-08 MED ORDER — APIXABAN 5 MG PO TABS: 5.0000 mg | ORAL_TABLET | Freq: Two times a day (BID) | ORAL | 5 refills | Status: DC

## 2020-03-08 MED ORDER — GABAPENTIN 300 MG PO CAPS: 300.0000 mg | ORAL_CAPSULE | Freq: Every day | ORAL | Status: DC

## 2020-03-08 NOTE — Progress Notes (Signed)
Progress Note  Patient Name: Patrick Franco Date of Encounter: 03/08/2020  Primary Cardiologist: [name], MD   Subjective   After increasing the dose of verapamil he walked the hallway and did not develop dyspnea or fatigue.  Denies dizziness or lightheadedness. On the monitor remains in atrial flutter with mostly 4: 1 AV block, heart rate has not exceeded 75 at night and 85-90 during the day when active.  Inpatient Medications    Scheduled Meds: . allopurinol  100 mg Oral Daily  . apixaban  5 mg Oral BID  . aspirin EC  81 mg Oral Daily  . atorvastatin  10 mg Oral Daily  . dutasteride  0.5 mg Oral Daily  . fluticasone furoate-vilanterol  1 puff Inhalation Daily  . furosemide  40 mg Oral Daily  . gabapentin  300 mg Oral QHS  . mirtazapine  30 mg Oral QHS  . tamsulosin  0.4 mg Oral Daily  . Tiotropium Bromide-Olodaterol  2 puff Inhalation Daily  . verapamil  360 mg Oral Daily   Continuous Infusions:  PRN Meds: acetaminophen, bisacodyl, ondansetron (ZOFRAN) IV   Vital Signs    Vitals:   03/07/20 2044 03/08/20 0508 03/08/20 0811 03/08/20 0839  BP: 93/60 111/78  126/64  Pulse: 63 62  72  Resp: _0 Temp: 98.3 F (36.8 C) (!) 97.4 F (36.3 C)    TempSrc: Oral Oral    SpO2: 98% 94% 95% 95%  Weight:  93.4 kg    Height:        Intake/Output Summary (Last 24 hours) at 03/08/2020 0946 Last data filed at 03/08/2020 0500 Gross per 24 hour  Intake 480 ml  Output 1350 ml  Net -870 ml   Last 3 Weights 03/08/2020 03/07/2020 03/06/2020  Weight (lbs) 205 lb 12.8 oz 205 lb 9.6 oz 207 lb 3.7 oz  Weight (kg) 93.35 kg 93.26 kg 94 kg      Telemetry    Atrial fibrillation with mostly 4: 1 AV block, variable block during the day, well rate controlled- Personally Reviewed  ECG    No new tracing- Personally Reviewed  Physical Exam  Appears well no complaints lying flat in bed,  GEN: No acute distress.   Neck: No JVD Cardiac: RRR, no murmurs, rubs, or  gallops.  Respiratory: Clear to auscultation bilaterally. GI: Soft, nontender, non-distended  MS: No edema; No deformity. Neuro:  Nonfocal  Psych: Normal affect   Labs    High Sensitivity Troponin:  No results for input(s): TROPONINIHS in the last 720 hours.    Chemistry Recent Labs  Lab 03/05/20 1938 03/05/20 1938 03/06/20 0330 03/07/20 0516 03/08/20 0550  NA 134*   < > 142 140 138  K 3.9   < > 3.9 3.6 3.8  CL 99   < > 102 99 102  CO2 21*   < > _1 GLUCOSE 178*   < > 105* 111* 104*  BUN 20   < > 19 24* 27*  CREATININE 1.06   < > 1.08 1.25* 1.08  CALCIUM 8.8*   < > 9.4 9.0 9.0  PROT 6.0*  --   --   --   --   ALBUMIN 3.7  --   --   --   --   AST 51*  --   --   --   --   ALT 71*  --   --   --   --  ALKPHOS 49  --   --   --   --   BILITOT 1.5*  --   --   --   --   GFRNONAA >60   < > >60 52* >60  GFRAA >60   < > >60 >60 >60  ANIONGAP 14   < > _0 < > = values in this interval not displayed.     Hematology Recent Labs  Lab 03/06/20 0330 03/07/20 0516 03/08/20 0550  WBC 6.1 6.0 5.8  RBC 4.11* 4.51 4.24  HGB 13.7 14.7 14.0  HCT 41.1 46.0 42.2  MCV 100.0 102.0* 99.5  MCH 33.3 32.6 33.0  MCHC 33.3 32.0 33.2  RDW 14.4 14.4 14.3  PLT 194 196 198    BNP Recent Labs  Lab 03/05/20 1938  BNP 355.3*     DDimer No results for input(s): DDIMER in the last 168 hours.   Radiology    ECHOCARDIOGRAM COMPLETE  Result Date: 03/06/2020    ECHOCARDIOGRAM REPORT   Patient Name:   Patrick Franco Date of Exam: 03/06/2020 Medical Rec #:  [id number]   Height:       70.0 in Accession #:    [id number]  Weight:       207.2 lb Date of Birth:  1932-10-01   BSA:          2.119 m Patient Age:    59 years    BP:           140/93 mmHg Patient Gender: M           HR:           67 bpm. Exam Location:  Inpatient Procedure: 2D Echo, Cardiac Doppler and Color Doppler Indications:    Dyspnea 786.09/R06.00  History:        Patient has prior history of Echocardiogram examinations, most                  recent 06/12/2019. Mitral Valve Disease and Aortic Valve Disease,                 Arrythmias:Atrial Flutter; Risk Factors:Sleep Apnea and Former                 Smoker. Mitral valve prolapse.  Sonographer:    Clayton Lefort RDCS (AE) Referring Phys: [id number] Abigail Butts  Sonographer Comments: No subcostal window and patient is morbidly obese. IMPRESSIONS  1. Left ventricular ejection fraction, by estimation, is 60 to 65%. The left ventricle has normal function. The left ventricle has no regional wall motion abnormalities. There is severe left ventricular hypertrophy. Left ventricular diastolic parameters  are indeterminate.  2. Right ventricular systolic function is normal. The right ventricular size is normal.  3. Left atrial size was severely dilated.  4. Right atrial size was moderately dilated.  5. There is mild bi lealfet prolapse with exhuberant MACwith some of the posterior leaflet MAC prolapsing into the LA. The mitral valve is degenerative. Mild mitral valve regurgitation. No evidence of mitral stenosis.  6. Recorded gradients appear lower than those obtained 06/12/19 when mean was 17 and peak 32 mmHg . The aortic valve was not well visualized. Aortic valve regurgitation is mild. Mild aortic valve stenosis.  7. The inferior vena cava is dilated in size with >50% respiratory variability, suggesting right atrial pressure of 8 mmHg. FINDINGS  Left Ventricle: Left ventricular ejection fraction, by estimation, is 60 to 65%. The left ventricle has normal function.  The left ventricle has no regional wall motion abnormalities. The left ventricular internal cavity size was normal in size. There is  severe left ventricular hypertrophy. Left ventricular diastolic parameters are indeterminate. Right Ventricle: The right ventricular size is normal. No increase in right ventricular wall thickness. Right ventricular systolic function is normal. Left Atrium: Left atrial size was severely dilated. Right  Atrium: Right atrial size was moderately dilated. Pericardium: There is no evidence of pericardial effusion. Mitral Valve: There is mild bi lealfet prolapse with exhuberant MACwith some of the posterior leaflet MAC prolapsing into the LA. The mitral valve is degenerative in appearance. There is severe thickening of the mitral valve leaflet(s). There is severe calcification of the mitral valve leaflet(s). Normal mobility of the mitral valve leaflets. Severe mitral annular calcification. Mild mitral valve regurgitation. No evidence of mitral valve stenosis. Tricuspid Valve: The tricuspid valve is normal in structure. Tricuspid valve regurgitation is not demonstrated. No evidence of tricuspid stenosis. Aortic Valve: Recorded gradients appear lower than those obtained 06/12/19 when mean was 17 and peak 32 mmHg. The aortic valve was not well visualized. . There is severe thickening and severe calcifcation of the aortic valve. Aortic valve regurgitation is  mild. Mild aortic stenosis is present. There is severe thickening of the aortic valve. There is severe calcifcation of the aortic valve. Aortic valve mean gradient measures 12.6 mmHg. Aortic valve peak gradient measures 22.3 mmHg. Aortic valve area, by VTI measures 1.19 cm. Pulmonic Valve: The pulmonic valve was normal in structure. Pulmonic valve regurgitation is not visualized. No evidence of pulmonic stenosis. Aorta: The aortic root is normal in size and structure. Venous: The inferior vena cava is dilated in size with greater than 50% respiratory variability, suggesting right atrial pressure of 8 mmHg. IAS/Shunts: No atrial level shunt detected by color flow Doppler.  LEFT VENTRICLE PLAX 2D LVIDd:         3.97 cm LVIDs:         2.42 cm LV PW:         2.17 cm LV IVS:        1.86 cm LVOT diam:     2.10 cm LV SV:         55 LV SV Index:   26 LVOT Area:     3.46 cm  RIGHT VENTRICLE             IVC RV Basal diam:  3.93 cm     IVC diam: 2.84 cm RV Mid diam:    3.58 cm  RV S prime:     14.50 cm/s TAPSE (M-mode): 2.0 cm LEFT ATRIUM            Index       RIGHT ATRIUM           Index LA diam:      6.20 cm  2.93 cm/m  RA Area:     26.50 cm LA Vol (A2C): 111.0 ml 52.38 ml/m RA Volume:   81.20 ml  38.32 ml/m LA Vol (A4C): 170.0 ml 80.22 ml/m  AORTIC VALVE AV Area (Vmax):    1.13 cm AV Area (Vmean):   1.07 cm AV Area (VTI):     1.19 cm AV Vmax:           236.20 cm/s AV Vmean:          166.200 cm/s AV VTI:            0.464 m AV Peak Grad:  22.3 mmHg AV Mean Grad:      12.6 mmHg LVOT Vmax:         76.90 cm/s LVOT Vmean:        51.420 cm/s LVOT VTI:          0.160 m LVOT/AV VTI ratio: 0.34  AORTA Ao Asc diam: 3.30 cm MR Peak grad:    112.8 mmHg MR Mean grad:    76.0 mmHg   SHUNTS MR Vmax:         531.00 cm/s Systemic VTI:  0.16 m MR Vmean:        414.0 cm/s  Systemic Diam: 2.10 cm MR PISA:         2.26 cm MR PISA Eff ROA: 13 mm MR PISA Radius:  0.60 cm Jenkins Rouge MD Electronically signed by Jenkins Rouge MD Signature Date/Time: 03/06/2020/5:19:41 PM    Final     Cardiac Studies  03/06/2020 echo 1. Left ventricular ejection fraction, by estimation, is 60 to 65%. The  left ventricle has normal function. The left ventricle has no regional  wall motion abnormalities. There is severe left ventricular hypertrophy.  Left ventricular diastolic parameters  are indeterminate.  2. Right ventricular systolic function is normal. The right ventricular  size is normal.  3. Left atrial size was severely dilated.  4. Right atrial size was moderately dilated.  5. There is mild bi lealfet prolapse with exhuberant MACwith some of the  posterior leaflet MAC prolapsing into the LA. The mitral valve is  degenerative. Mild mitral valve regurgitation. No evidence of mitral  stenosis.  6. Recorded gradients appear lower than those obtained 06/12/19 when mean  was 17 and peak 32 mmHg . The aortic valve was not well visualized. Aortic  valve regurgitation is mild. Mild aortic  valve stenosis.  7. The inferior vena cava is dilated in size with >50% respiratory  variability, suggesting right atrial pressure of 8 mmHg.   Patient Profile     86 y.o. male with with CAD, longstanding mitral valve prolapse with mild MR and mild degenerative aortic stenosis served LVEF presents with exertional dyspnea and tachycardia, found to have atrial flutter with variable AV block.  Assessment & Plan    1. CHF: Appears well, probably euvolemic.  Will send home on low-dose furosemide instead of indapamide.  Net diuresis almost 6 L since admission. 2. Aflutter: Probably left atrial, not a great candidate for ablation.  On anticoagulation.  Now better rate control during physical activity.  Bring back in 3 weeks for elective cardioversion if still in atrial flutter. 3. CAD: Asymptomatic. 4. AS/ MVP-MR: Valvular abnormalities are not yet hemodynamically significant and appear stable compared to previous echo assessments.  Transition of care visit with ECG in 2 weeks to see if he is still in atrial flutter and to assess long-term dose of diuretic therapy.  Plan elective cardioversion in 3 weeks.  For questions or updates, please contact [facility] Please consult [email or web address] for contact info under        Signed, Sanda Klein, MD  03/08/2020, 9:46 AM

## 2020-03-08 NOTE — Discharge Instructions (Signed)
Medication Changes: - STOP Indapamide and START Lasix 40mg  daily. - START Eliquis 5mg  twice daily. - INCREASE Verapamil to 360mg  daily.

## 2020-03-08 NOTE — Discharge Summary (Signed)
Discharge Summary    Patient ID: MACDONALD RIGOR MRN: [id number]; DOB: 01/10/1932  Admit date: 03/05/2020 Discharge date: 03/08/2020  Primary Care Provider: Lavone Orn, MD  Primary Cardiologist: [name], MD  Primary Electrophysiologist:  None   Discharge Diagnoses    Principal Problem:   Acute diastolic CHF (congestive heart failure) [name]) Active Problems:   Atrial flutter with rapid ventricular response ([place])   CAD (coronary artery disease)   Mitral valve prolapse   Aortic valve disease    Diagnostic Studies/Procedures    Echocardiogram 03/06/2020:  Impressions:  1. Left ventricular ejection fraction, by estimation, is 60 to 65%. The  left ventricle has normal function. The left ventricle has no regional  wall motion abnormalities. There is severe left ventricular hypertrophy.  Left ventricular diastolic parameters  are indeterminate.  2. Right ventricular systolic function is normal. The right ventricular  size is normal.  3. Left atrial size was severely dilated.  4. Right atrial size was moderately dilated.  5. There is mild bi lealfet prolapse with exhuberant MACwith some of the  posterior leaflet MAC prolapsing into the LA. The mitral valve is  degenerative. Mild mitral valve regurgitation. No evidence of mitral  stenosis.  6. Recorded gradients appear lower than those obtained 06/12/19 when mean  was 17 and peak 32 mmHg . The aortic valve was not well visualized. Aortic  valve regurgitation is mild. Mild aortic valve stenosis.  7. The inferior vena cava is dilated in size with >50% respiratory  variability, suggesting right atrial pressure of 8 mmHg.    History of Present Illness     Patrick Franco is a 86 year old male with a history of CAD, aortic stenosis, mitral valve prolapse, hypertension, and obstructive sleep apnea.    Patient was seen in our officefor evaluation of aortic stenosis and abnormal EKG in May 2018. He was having a  colonoscopy on 04/04/17 and was found to have an irregular heart rhythm. He was sent to his primary care office where an EKG showed sinus rhythm with PACs. Echo from November 2017 showed LVEF of 76-73%, grade 1 diastolic dysfunction, functionally bicuspid aortic valve with moderate stenosis (mean gradient 21 mmHg, AVA 1.4cm2). There was mild mitral regurgitation. At his first visit in our office in May 2018, he described dyspnea and less energy but no chest pain or dizziness. Nuclear stress test in May 2018 showed no ischemia. Most recent Echo fromJuly [id number] normal LV systolic function, mild aortic valve stenosis (mean gradient 35mHg), and mildAI.He has been on aspirin and a statin due to finding of CAD on the chest CT.   Patient was seen by Dr. CBurt Knackon 03/05/2020. He reported feeling terrible. He had gained 10-15 lbs over the past two weeks. He had dyspnea with minimal exertion. He had lower extremity edema and abdominal bloating. He denied no chest pain, dizziness, near syncope, syncope, or awareness of his heart racing.    EKG in the office showed atrial flutter with RVR.Patient was directly admitted for IV diuresis and management of atrial flutter.    Hospital Course     Consultants:   Acute Decompensation of Diastolic CHF  b Felt to be secondary to atrial flutter. BNP elevated at 355. Chest x-ray showed stable emphysema but no acute findings. Echo showed LVEF of 60-65% with normal wall motion, severe LVH, biatrial enlargement, mitral valve prolapse with significant myxomatous/degenerative changes but only mild MR, mild AS, and mild AI. Patient diuresed with IV Lasix with good  response. Net negative 5.7 L this admission. Discharge weight 205.8 lbs, down from 212 lbs since admission. Will stop Indapamide and start Lasix 26m daily at discharge. Will need repeat BMET at follow-up visit.  Atrial Flutter with RVR  Felt to likely be atypical left atrial flutter based on ECG tracings  (reviewed with Dr. TLovena Leand felt to be less amenable to ablation). Easy to rate control at rest, but promptly increases with exercise. Verapamil increased to 360 mg daily and rates and symptoms improved. Started on Eliquis 579mtwice daily. Will arrange for follow-up visit in 2 weeks. If still in atrial flutter, plan is for elective cardioversion after 3 weeks of uninterrupted anticoagulation.   CAD  Left main and 3 vessel coronary atherosclerosis noted on high resolution chest CT in February 2019. Patient has had no angina even with tachycardia. No Aspirin given need for Eliquis. Continue home statin.   Aortic Valve Disease  Echo showed mild AS and AI. No significant progression from Echo last year.   Mitral Valve Prolapse  Echo showed mild bi lealfet prolapse with significant myxomatous/degenerative changes but only mild MR.   Patient seen and examined by Dr. CrSallyanne Kusteroday and determined to be stable for discharge. Will arrange outpatient follow-up. Medications as below.    Did the patient have an acute coronary syndrome (MI, NSTEMI, STEMI, etc) this admission?:  No                               Did the patient have a percutaneous coronary intervention (stent / angioplasty)?:  No.   _____________  Discharge Vitals Blood pressure 126/64, pulse 72, temperature (!) 97.4 F (36.3 C), temperature source Oral, resp. rate 15, height _0  (1.778 m), weight 93.4 kg, SpO2 95 %.  Filed Weights   03/06/20 0500 03/07/20 0405 03/08/20 0508  Weight: 94 kg 93.3 kg 93.4 kg    Labs & Radiologic Studies    CBC Recent Labs    03/05/20 1938 03/06/20 0330 03/07/20 0516 03/08/20 0550  WBC 6.2   < > 6.0 5.8  NEUTROABS 4.3  --   --   --   HGB 13.9   < > 14.7 14.0  HCT 42.4   < > 46.0 42.2  MCV 99.3   < > 102.0* 99.5  PLT 208   < > 196 198   < > = values in this interval not displayed.   Basic Metabolic Panel Recent Labs    03/07/20 0516 03/08/20 0550  NA 140 138  K 3.6 3.8  CL 99 102    CO2 31 26  GLUCOSE 111* 104*  BUN 24* 27*  CREATININE 1.25* 1.08  CALCIUM 9.0 9.0  MG 1.9 2.1   Liver Function Tests Recent Labs    03/05/20 1938  AST 51*  ALT 71*  ALKPHOS 49  BILITOT 1.5*  PROT 6.0*  ALBUMIN 3.7   No results for input(s): LIPASE, AMYLASE in the last 72 hours. High Sensitivity Troponin:   No results for input(s): TROPONINIHS in the last 720 hours.  BNP Invalid input(s): POCBNP D-Dimer No results for input(s): DDIMER in the last 72 hours. Hemoglobin A1C No results for input(s): HGBA1C in the last 72 hours. Fasting Lipid Panel Recent Labs    03/06/20 0330  CHOL 96  HDL 48  LDLCALC 38  TRIG 50  CHOLHDL 2.0   Thyroid Function Tests Recent Labs    03/05/20 1938  TSH 2.464   _____________  DG Chest 2 View  Result Date: 03/05/2020 CLINICAL DATA:  Dyspnea on exertion, weight gain, lower extremity edema EXAM: CHEST - 2 VIEW COMPARISON:  02/19/2020 FINDINGS: Frontal and lateral views of the chest demonstrates stable enlargement of the cardiac silhouette. Lungs are hyperinflated with background interstitial prominence consistent with emphysema. There is bibasilar scarring unchanged. No airspace disease, effusion, or pneumothorax. No acute bony abnormality. IMPRESSION: 1. Stable emphysema.  No acute process. Electronically Signed   By: Randa Ngo M.D.   On: 03/05/2020 20:17   DG Chest 2 View  Result Date: 02/19/2020 CLINICAL DATA:  Shortness of breath EXAM: CHEST - 2 VIEW COMPARISON:  08/08/2017 FINDINGS: Cardiomegaly. Aortic atherosclerosis. Apparent early interstitial edema. No alveolar edema. No infiltrate, collapse or effusion. Chronic spinal curvature as seen previously. IMPRESSION: Cardiomegaly.  Suspicion of early interstitial pulmonary edema. Electronically Signed   By: Nelson Chimes M.D.   On: 02/19/2020 15:07   ECHOCARDIOGRAM COMPLETE  Result Date: 03/06/2020    ECHOCARDIOGRAM REPORT   Patient Name:   Patrick Franco Date of Exam: 03/06/2020  Medical Rec #:  [id number]   Height:       70.0 in Accession #:    [id number]  Weight:       207.2 lb Date of Birth:  09/08/1932   BSA:          2.119 m Patient Age:    31 years    BP:           140/93 mmHg Patient Gender: M           HR:           67 bpm. Exam Location:  Inpatient Procedure: 2D Echo, Cardiac Doppler and Color Doppler Indications:    Dyspnea 786.09/R06.00  History:        Patient has prior history of Echocardiogram examinations, most                 recent 06/12/2019. Mitral Valve Disease and Aortic Valve Disease,                 Arrythmias:Atrial Flutter; Risk Factors:Sleep Apnea and Former                 Smoker. Mitral valve prolapse.  Sonographer:    Clayton Lefort RDCS (AE) Referring Phys: [id number] Abigail Butts  Sonographer Comments: No subcostal window and patient is morbidly obese. IMPRESSIONS  1. Left ventricular ejection fraction, by estimation, is 60 to 65%. The left ventricle has normal function. The left ventricle has no regional wall motion abnormalities. There is severe left ventricular hypertrophy. Left ventricular diastolic parameters  are indeterminate.  2. Right ventricular systolic function is normal. The right ventricular size is normal.  3. Left atrial size was severely dilated.  4. Right atrial size was moderately dilated.  5. There is mild bi lealfet prolapse with exhuberant MACwith some of the posterior leaflet MAC prolapsing into the LA. The mitral valve is degenerative. Mild mitral valve regurgitation. No evidence of mitral stenosis.  6. Recorded gradients appear lower than those obtained 06/12/19 when mean was 17 and peak 32 mmHg . The aortic valve was not well visualized. Aortic valve regurgitation is mild. Mild aortic valve stenosis.  7. The inferior vena cava is dilated in size with >50% respiratory variability, suggesting right atrial pressure of 8 mmHg. FINDINGS  Left Ventricle: Left ventricular ejection fraction, by estimation, is 60 to 65%. The left ventricle has  normal function. The left ventricle has no regional wall motion abnormalities. The left ventricular internal cavity size was normal in size. There is  severe left ventricular hypertrophy. Left ventricular diastolic parameters are indeterminate. Right Ventricle: The right ventricular size is normal. No increase in right ventricular wall thickness. Right ventricular systolic function is normal. Left Atrium: Left atrial size was severely dilated. Right Atrium: Right atrial size was moderately dilated. Pericardium: There is no evidence of pericardial effusion. Mitral Valve: There is mild bi lealfet prolapse with exhuberant MACwith some of the posterior leaflet MAC prolapsing into the LA. The mitral valve is degenerative in appearance. There is severe thickening of the mitral valve leaflet(s). There is severe calcification of the mitral valve leaflet(s). Normal mobility of the mitral valve leaflets. Severe mitral annular calcification. Mild mitral valve regurgitation. No evidence of mitral valve stenosis. Tricuspid Valve: The tricuspid valve is normal in structure. Tricuspid valve regurgitation is not demonstrated. No evidence of tricuspid stenosis. Aortic Valve: Recorded gradients appear lower than those obtained 06/12/19 when mean was 17 and peak 32 mmHg. The aortic valve was not well visualized. . There is severe thickening and severe calcifcation of the aortic valve. Aortic valve regurgitation is  mild. Mild aortic stenosis is present. There is severe thickening of the aortic valve. There is severe calcifcation of the aortic valve. Aortic valve mean gradient measures 12.6 mmHg. Aortic valve peak gradient measures 22.3 mmHg. Aortic valve area, by VTI measures 1.19 cm. Pulmonic Valve: The pulmonic valve was normal in structure. Pulmonic valve regurgitation is not visualized. No evidence of pulmonic stenosis. Aorta: The aortic root is normal in size and structure. Venous: The inferior vena cava is dilated in size with  greater than 50% respiratory variability, suggesting right atrial pressure of 8 mmHg. IAS/Shunts: No atrial level shunt detected by color flow Doppler.  LEFT VENTRICLE PLAX 2D LVIDd:         3.97 cm LVIDs:         2.42 cm LV PW:         2.17 cm LV IVS:        1.86 cm LVOT diam:     2.10 cm LV SV:         55 LV SV Index:   26 LVOT Area:     3.46 cm  RIGHT VENTRICLE             IVC RV Basal diam:  3.93 cm     IVC diam: 2.84 cm RV Mid diam:    3.58 cm RV S prime:     14.50 cm/s TAPSE (M-mode): 2.0 cm LEFT ATRIUM            Index       RIGHT ATRIUM           Index LA diam:      6.20 cm  2.93 cm/m  RA Area:     26.50 cm LA Vol (A2C): 111.0 ml 52.38 ml/m RA Volume:   81.20 ml  38.32 ml/m LA Vol (A4C): 170.0 ml 80.22 ml/m  AORTIC VALVE AV Area (Vmax):    1.13 cm AV Area (Vmean):   1.07 cm AV Area (VTI):     1.19 cm AV Vmax:           236.20 cm/s AV Vmean:          166.200 cm/s AV VTI:            0.464 m AV Peak Grad:  22.3 mmHg AV Mean Grad:      12.6 mmHg LVOT Vmax:         76.90 cm/s LVOT Vmean:        51.420 cm/s LVOT VTI:          0.160 m LVOT/AV VTI ratio: 0.34  AORTA Ao Asc diam: 3.30 cm MR Peak grad:    112.8 mmHg MR Mean grad:    76.0 mmHg   SHUNTS MR Vmax:         531.00 cm/s Systemic VTI:  0.16 m MR Vmean:        414.0 cm/s  Systemic Diam: 2.10 cm MR PISA:         2.26 cm MR PISA Eff ROA: 13 mm MR PISA Radius:  0.60 cm Jenkins Rouge MD Electronically signed by Jenkins Rouge MD Signature Date/Time: 03/06/2020/5:19:41 PM    Final    Disposition   Patient is being discharged home today in good condition.  Follow-up Plans & Appointments    Follow-up Information    Burnell Blanks, MD Follow up.   Specialty: Cardiology Why: Our office will call you to schedule a hospital follow-up. However, if you do not hear from [place] by mid-day Tuesday 03/10/2020, please call our office to schedule this.  Contact information: [place] [address] [place] [address] [phone]         Lavone Orn, MD.   Specialty: Internal Medicine Why: Please follow up in a week Contact information: [address] [place] [address] [phone]          Discharge Instructions    Diet - low sodium heart healthy   Complete by: As directed    Increase activity slowly   Complete by: As directed       Discharge Medications   Allergies as of 03/08/2020   No Known Allergies     Medication List    STOP taking these medications   indapamide 2.5 MG tablet Commonly known as: LOZOL     TAKE these medications   allopurinol 100 MG tablet Commonly known as: ZYLOPRIM TAKE 1 TABLET ONCE DAILY.   apixaban 5 MG Tabs tablet Commonly known as: ELIQUIS Take 1 tablet (5 mg total) by mouth 2 (two) times daily.   aspirin EC 81 MG tablet Take 81 mg by mouth daily.   atorvastatin 10 MG tablet Commonly known as: LIPITOR Take 10 mg by mouth daily.   Breo Ellipta 100-25 MCG/INH Aepb Generic drug: fluticasone furoate-vilanterol Inhale 1 puff into the lungs daily.   colchicine 0.6 MG tablet Take 1 tablet (0.6 mg total) by mouth 2 (two) times daily as needed. What changed: reasons to take this   dutasteride 0.5 MG capsule Commonly known as: AVODART Take 0.5 mg by mouth daily.   FISH OIL PO Take 1,000 mg by mouth daily.   Flutter Devi Use the flutter valve 10 times when doing it daily.   furosemide 40 MG tablet Commonly known as: LASIX Take 1 tablet (40 mg total) by mouth daily. Start taking on: March 09, 2020   gabapentin 300 MG capsule Commonly known as: NEURONTIN Take 1 capsule (300 mg total) by mouth at bedtime.   mirtazapine 30 MG tablet Commonly known as: REMERON TAKE ONE TABLET AT BEDTIME.   multivitamin tablet Take 1 tablet by mouth daily.   PROBIOTIC DAILY PO Take 1 tablet by mouth daily.   tamsulosin 0.4 MG Caps capsule Commonly known as: FLOMAX Take  0.4 mg by mouth daily.   Tiotropium Bromide-Olodaterol 2.5-2.5 MCG/ACT  Aers Commonly known as: Stiolto Respimat Inhale 2 puffs into the lungs daily.   Turmeric 500 MG Caps Take 500 mg by mouth daily.   verapamil 180 MG CR tablet Commonly known as: CALAN-SR Take 2 tablets (360 mg total) by mouth daily. Start taking on: March 09, 2020 What changed:   medication strength  how much to take   Vitamin D (Ergocalciferol) 1.25 MG (50000 UNIT) Caps capsule Commonly known as: DRISDOL TAKE 1 CAPSULE WEEKLY. What changed:   when to take this  additional instructions          Outstanding Labs/Studies   Repeat BMET at follow-up.  Duration of Discharge Encounter   Greater than 30 minutes including physician time.  Signed, Darreld Mclean, PA-C 03/08/2020, 5:26 PM

## 2020-03-10 ENCOUNTER — Telehealth: Payer: Self-pay

## 2020-03-10 ENCOUNTER — Telehealth: Payer: Self-pay | Admitting: Cardiovascular Disease

## 2020-03-10 DIAGNOSIS — I10 Essential (primary) hypertension: Secondary | ICD-10-CM | POA: Diagnosis not present

## 2020-03-10 DIAGNOSIS — I484 Atypical atrial flutter: Secondary | ICD-10-CM | POA: Diagnosis not present

## 2020-03-10 NOTE — Addendum Note (Signed)
Addended by: Mendel Ryder on: 03/10/2020 08:30 AM   Modules accepted: Orders

## 2020-03-10 NOTE — Telephone Encounter (Signed)
New message   Per [facility] G setup a toc appt with Richardson Dopp on 03/23/2020 at 10:45 am.

## 2020-03-10 NOTE — Telephone Encounter (Signed)
-----   Message from Darreld Mclean, [place] sent at 03/08/2020  5:28 PM EDT ----- Regarding: Hospital Follow-UP Patient discharge today. He needs TOC follow-up in 10-14 days with Dr. Angelena Form or one of the APPs on his team. Can you please call patient to schedule this?  Thank you! Callie

## 2020-03-10 NOTE — Telephone Encounter (Signed)
Patient's wife Dorian Pod contacted regarding discharge from [facility] on 03/08/2020.  Patient's wife Dorian Pod understands that the pt is to follow up with provider: Not scheduled yet on date: Not scheduled yet at time: Not scheduled yet At: ?. I will forward this message to our scheduling team as Dorian Pod is awaiting a call from them to schedule the pts hosp f/u date, time and office address.  Patient understands discharge instructions? Yes Patient understands medications and regiment? Yes Patient understands to bring all medications to this visit? yes

## 2020-03-11 NOTE — Telephone Encounter (Signed)
**Note De-Identified  Obfuscation** This TCM call has already been made. I sent messages to scheduling X 2 yesterday (1 several hours before calling the pt and 1 time after I called with message stating TCM call has been completed) to schedule but it was not prior to me calling the pt. Please make sure the pt is aware of this appt date and time as I will not be calling the pt with this info. Thanks.

## 2020-03-12 DIAGNOSIS — I484 Atypical atrial flutter: Secondary | ICD-10-CM | POA: Diagnosis not present

## 2020-03-12 DIAGNOSIS — Z5181 Encounter for therapeutic drug level monitoring: Secondary | ICD-10-CM | POA: Diagnosis not present

## 2020-03-12 DIAGNOSIS — I10 Essential (primary) hypertension: Secondary | ICD-10-CM | POA: Diagnosis not present

## 2020-03-12 DIAGNOSIS — I5031 Acute diastolic (congestive) heart failure: Secondary | ICD-10-CM | POA: Diagnosis not present

## 2020-03-13 ENCOUNTER — Telehealth: Payer: Self-pay | Admitting: Internal Medicine

## 2020-03-13 NOTE — Telephone Encounter (Signed)
Lauraine Rinne, NP  Valerie Salts, CMA  Kidney functioning stable. Keep follow-up with cardiology in our office.  Wyn Quaker, FNP    Spoke with patient. He is aware of results and verbalized understanding. Nothing further needed at time of call.

## 2020-03-18 ENCOUNTER — Ambulatory Visit: Payer: Medicare Other | Admitting: Internal Medicine

## 2020-03-22 NOTE — Progress Notes (Signed)
Cardiology Office Note:    Date:  03/23/2020   ID:  [name], DOB July 22, 1932, MRN [id number]  PCP:  Lavone Orn, MD  Cardiologist:  [name], MD  Electrophysiologist:  None   Referring MD: Lavone Orn, MD   Chief Complaint:  Hospitalization Follow-up (AFlutter, CHF)    Patient Profile:    Patrick Franco is a 86 y.o. male with:   Coronary artery disease  CT 12/2017: LM and 3 v coronary atherosclerosis   Myoview 03/2017: no ischemia   Aortic stenosis   Echocardiogram 02/2020: EF 60-65, mild AS (mean 17 mmHg), mild MVP w mild MR  Diastolic CHF  Atrial Flutter  New onset 02/2020 >> c/b acute diastolic CHF; admitted to hosp  Hypertension   MVP  Mild MR Echocardiogram 02/2020  Sleep apnea   PACs  Aortic atherosclerosis (CT 12/2017)    Prior CV studies: Echocardiogram 03/05/2020 EF 60-65, no RWMA, severe LVH, normal RVSF, severe LAE, mod RAE, mild MVP with mild MR, mild AI, mild AS (mean 17 mmHg).    Myoview 04/07/17 EF 61, no ischemia or infarction.  Low Risk    History of Present Illness:    Patrick Franco was last seen in clinic by Dr. Angelena Form 03/05/2020.  He was in AFlutter with RVR c/b acute diastolic CHF.  He was admitted to the hospital (4/22-4/25).  He was diuresed with IV Furosemide.  Echocardiogram demonstrated normal LVF and mild aortic stenosis.  His rate was controlled with Verapamil and placed on Apixaban.  It was felt he could undergo outpatient DCCV after 3 weeks of uninterrupted anticoagulation.  Of note, Dr. Lovena Le reviewed his ECG and felt his AFlutter was atypical and not amenable to ablation.  DC weight was 205.8 lbs.    He returns for post hospital follow up.  He is here with his wife.  Since discharge from the hospital, he has felt poorly.  He is short of breath with activity.  He has not had chest pain.  He has not had orthopnea, paroxysmal nocturnal dyspnea or lower extremity swelling.  He has not had syncope.  He has not had any bleeding  issues.  He has missed 2 evening doses of apixaban.  The last missed dose was 2 to 3 days ago.  Past Medical History:  Diagnosis Date  . Aortic stenosis   . Arthritis   . BPH (benign prostatic hyperplasia)   . Chronic low back pain   . Depression   . Diverticulitis   . Elevated PSA   . Foot drop, right   . Gait disorder 02/03/2015  . Hip pain   . History of colonoscopy   . HOH (hard of hearing)    hearing aid  . Hypertension   . Lumbar radiculopathy   . Mitral valve prolapse   . Onychomycosis   . Peptic ulcer disease   . Sepsis ([place]) 2005   e. coli-after prostate bx  . Sleep apnea   . Torn ACL   . Tracheobronchomalacia    seen on 2019 Chest CT    Current Medications: Current Meds  Medication Sig  . allopurinol (ZYLOPRIM) 100 MG tablet TAKE 1 TABLET ONCE DAILY.  [name] apixaban (ELIQUIS) 5 MG TABS tablet Take 1 tablet (5 mg total) by mouth 2 (two) times daily.  [name] aspirin EC 81 MG tablet Take 81 mg by mouth daily.  [name] atorvastatin (LIPITOR) 10 MG tablet Take 10 mg by mouth daily.   . colchicine 0.6 MG tablet Take  1 tablet (0.6 mg total) by mouth 2 (two) times daily as needed.  . dutasteride (AVODART) 0.5 MG capsule Take 0.5 mg by mouth daily.  . fluticasone furoate-vilanterol (BREO ELLIPTA) 100-25 MCG/INH AEPB Inhale 1 puff into the lungs daily.  . furosemide (LASIX) 40 MG tablet Take 1 tablet (40 mg total) by mouth daily.  . mirtazapine (REMERON) 30 MG tablet TAKE ONE TABLET AT BEDTIME.  . Multiple Vitamin (MULTIVITAMIN) tablet Take 1 tablet by mouth daily.  . Omega-3 Fatty Acids (FISH OIL PO) Take 1,000 mg by mouth daily.  . potassium chloride (KLOR-CON) 10 MEQ tablet Take 10 mEq by mouth daily.  . Probiotic Product (PROBIOTIC DAILY PO) Take 1 tablet by mouth daily.  [name] pyridOXINE (VITAMIN B-6) 100 MG tablet Take 100 mg by mouth daily.  [name] Respiratory Therapy Supplies (FLUTTER) DEVI Use the flutter valve 10 times when doing it daily.  . tamsulosin (FLOMAX) 0.4 MG CAPS capsule  Take 0.4 mg by mouth daily.  . Turmeric 500 MG CAPS Take 500 mg by mouth daily.  . verapamil (CALAN-SR) 180 MG CR tablet Take 2 tablets (360 mg total) by mouth daily.  . vitamin B-12 (CYANOCOBALAMIN) 1000 MCG tablet Take 1,000 mcg by mouth daily.  . Vitamin D, Ergocalciferol, (DRISDOL) 1.25 MG (50000 UNIT) CAPS capsule TAKE 1 CAPSULE WEEKLY.     Allergies:   Patient has no known allergies.   Social History   Tobacco Use  . Smoking status: Former Smoker    Packs/day: 0.50    Types: Cigarettes    Start date: 1960    Quit date: 1961    Years since quitting: 60.3  . Smokeless tobacco: Never Used  . 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 Alcohol use: Not on file  . Drug use: No     Family Hx: The patient's family history includes Cancer - Lung (age of onset: 586 in his father; Multiple myeloma (age of onset: 567 in his brother; Pneumonia (age of onset: 825 in his mother.  ROS   EKGs/Labs/Other Test Reviewed:    EKG:  EKG is  ordered today.  The ekg ordered today demonstrates atrial flutter, HR 135, QTC 477, nonspecific ST-T wave changes  Recent Labs: 02/19/2020: NT-Pro BNP 721 03/05/2020: ALT 71; B Natriuretic Peptide 355.3; TSH 2.464 03/08/2020: Magnesium 2.1 03/23/2020: BUN 25; Creatinine, Ser 1.03; Hemoglobin 15.2; Platelets 220; Potassium 4.1; Sodium 143   Recent Lipid Panel Lab Results  Component Value Date/Time   CHOL 96 03/06/2020 03:30 AM   TRIG 50 03/06/2020 03:30 AM   HDL 48 03/06/2020 03:30 AM   CHOLHDL 2.0 03/06/2020 03:30 AM   LDLCALC 38 03/06/2020 03:30 AM    Physical Exam:    VS:  BP 132/88   Pulse (!) 135   Ht 5' 10"  (1.778 m)   Wt 210 lb 1.9 oz (95.3 kg)   SpO2 95%   BMI 30.15 kg/m     Wt Readings from Last 3 Encounters:  03/23/20 210 lb 1.9 oz (95.3 kg)  03/08/20 205 lb 12.8 oz (93.4 kg)  03/05/20 219 lb (99.3 kg)     Constitutional:      Appearance: Healthy appearance. Not in distress.  Neck:      Thyroid: No thyromegaly.     Vascular: JVD normal.  Pulmonary:     Effort: Pulmonary effort is normal.     Breath sounds: No wheezing. No rales.  Cardiovascular:  Tachycardia present. Regular rhythm. Normal S1. Normal S2.     Murmurs: There is no murmur.  Edema:    Peripheral edema absent.  Abdominal:     Palpations: Abdomen is soft. There is no hepatomegaly.  Skin:    General: Skin is warm and dry.  Neurological:     General: No focal deficit present.     Mental Status: Alert and oriented to person, place and time.     Cranial Nerves: Cranial nerves are intact.      ASSESSMENT & PLAN:    1. Atypical atrial flutter ([place]) He remains in atrial flutter with rapid ventricular rate.  He is symptomatic with fatigue and shortness of breath.  He does not appear to be significantly volume overloaded.  He did miss 2 doses of Apixaban.  The last missed dose was 2 to 3 days ago.  We discussed the importance of taking uninterrupted anticoagulation prior to proceeding with cardioversion.  It has been less than 3 weeks since he was started on anticoagulation.  I have recommended that we proceed with a transesophageal echocardiogram guided cardioversion.  Risks and benefits have been discussed with him and his wife.  They agree to proceed.  I reviewed this with Dr. Harrington Challenger (attending MD) who agreed.  I will continue his verapamil at 360 mg daily.  Start metoprolol tartrate 25 mg twice daily for better rate control.  We discussed the importance of not missing any further doses of Apixaban between now and his cardioversion.  Obtain BMET, CBC  2. Chronic diastolic CHF (congestive heart failure) (HCC) Volume status seems stable.  Continue current dose of furosemide.  Obtain BMET today.  3. Nonrheumatic aortic valve stenosis Mild by recent echocardiogram.  4. Coronary artery disease involving native coronary artery of native heart without angina pectoris He is not having any anginal symptoms.  Continue  aspirin, statin therapy.  5. Essential hypertension Blood pressure somewhat borderline.  Start metoprolol tartrate as noted above.    Dispo:  Return in about 2 weeks (around 04/06/2020) for Post Procedure Follow Up.   Medication Adjustments/Labs and Tests Ordered: Current medicines are reviewed at length with the patient today.  Concerns regarding medicines are outlined above.  Tests Ordered: Orders Placed This Encounter  Procedures  . Basic metabolic panel  . CBC  . EKG 12-Lead   Medication Changes: Meds ordered this encounter  Medications  . metoprolol tartrate (LOPRESSOR) 25 MG tablet    Sig: Take 1 tablet (25 mg total) by mouth 2 (two) times daily.    Dispense:  180 tablet    Refill:  3    Signed, Richardson Dopp, PA-C  03/23/2020 5:04 PM    [facility] [place], [place], [place]  [address] Phone: [phone]; Fax: [phone]

## 2020-03-22 NOTE — H&P (View-Only) (Signed)
Cardiology Office Note:    Date:  03/23/2020   ID:  [name], DOB [birth year], MRN [id number]  PCP:  Lavone Orn, MD  Cardiologist:  [name], MD  Electrophysiologist:  None   Referring MD: Lavone Orn, MD   Chief Complaint:  Hospitalization Follow-up (AFlutter, CHF)    Patient Profile:    Patrick Franco is a 86 y.o. male with:   Coronary artery disease  CT 12/2017: LM and 3 v coronary atherosclerosis   Myoview 03/2017: no ischemia   Aortic stenosis   Echocardiogram 02/2020: EF 60-65, mild AS (mean 17 mmHg), mild MVP w mild MR  Diastolic CHF  Atrial Flutter  New onset 02/2020 >> c/b acute diastolic CHF; admitted to hosp  Hypertension   MVP  Mild MR Echocardiogram 02/2020  Sleep apnea   PACs  Aortic atherosclerosis (CT 12/2017)    Prior CV studies: Echocardiogram 03/05/2020 EF 60-65, no RWMA, severe LVH, normal RVSF, severe LAE, mod RAE, mild MVP with mild MR, mild AI, mild AS (mean 17 mmHg).    Myoview 04/07/17 EF 61, no ischemia or infarction.  Low Risk    History of Present Illness:    Patrick Franco was last seen in clinic by Dr. Angelena Form 03/05/2020.  He was in AFlutter with RVR c/b acute diastolic CHF.  He was admitted to the hospital (4/22-4/25).  He was diuresed with IV Furosemide.  Echocardiogram demonstrated normal LVF and mild aortic stenosis.  His rate was controlled with Verapamil and placed on Apixaban.  It was felt he could undergo outpatient DCCV after 3 weeks of uninterrupted anticoagulation.  Of note, Dr. Lovena Le reviewed his ECG and felt his AFlutter was atypical and not amenable to ablation.  DC weight was 205.8 lbs.    He returns for post hospital follow up.  He is here with his wife.  Since discharge from the hospital, he has felt poorly.  He is short of breath with activity.  He has not had chest pain.  He has not had orthopnea, paroxysmal nocturnal dyspnea or lower extremity swelling.  He has not had syncope.  He has not had any bleeding  issues.  He has missed 2 evening doses of apixaban.  The last missed dose was 2 to 3 days ago.  Past Medical History:  Diagnosis Date  . Aortic stenosis   . Arthritis   . BPH (benign prostatic hyperplasia)   . Chronic low back pain   . Depression   . Diverticulitis   . Elevated PSA   . Foot drop, right   . Gait disorder 02/03/2015  . Hip pain   . History of colonoscopy   . HOH (hard of hearing)    hearing aid  . Hypertension   . Lumbar radiculopathy   . Mitral valve prolapse   . Onychomycosis   . Peptic ulcer disease   . Sepsis ([place]) 2005   e. coli-after prostate bx  . Sleep apnea   . Torn ACL   . Tracheobronchomalacia    seen on 2019 Chest CT    Current Medications: Current Meds  Medication Sig  . allopurinol (ZYLOPRIM) 100 MG tablet TAKE 1 TABLET ONCE DAILY.  [name] apixaban (ELIQUIS) 5 MG TABS tablet Take 1 tablet (5 mg total) by mouth 2 (two) times daily.  [name] aspirin EC 81 MG tablet Take 81 mg by mouth daily.  [name] atorvastatin (LIPITOR) 10 MG tablet Take 10 mg by mouth daily.   . colchicine 0.6 MG tablet Take  1 tablet (0.6 mg total) by mouth 2 (two) times daily as needed.  . dutasteride (AVODART) 0.5 MG capsule Take 0.5 mg by mouth daily.  . fluticasone furoate-vilanterol (BREO ELLIPTA) 100-25 MCG/INH AEPB Inhale 1 puff into the lungs daily.  . furosemide (LASIX) 40 MG tablet Take 1 tablet (40 mg total) by mouth daily.  . mirtazapine (REMERON) 30 MG tablet TAKE ONE TABLET AT BEDTIME.  . Multiple Vitamin (MULTIVITAMIN) tablet Take 1 tablet by mouth daily.  . Omega-3 Fatty Acids (FISH OIL PO) Take 1,000 mg by mouth daily.  . potassium chloride (KLOR-CON) 10 MEQ tablet Take 10 mEq by mouth daily.  . Probiotic Product (PROBIOTIC DAILY PO) Take 1 tablet by mouth daily.  [name] pyridOXINE (VITAMIN B-6) 100 MG tablet Take 100 mg by mouth daily.  [name] Respiratory Therapy Supplies (FLUTTER) DEVI Use the flutter valve 10 times when doing it daily.  . tamsulosin (FLOMAX) 0.4 MG CAPS capsule  Take 0.4 mg by mouth daily.  . Turmeric 500 MG CAPS Take 500 mg by mouth daily.  . verapamil (CALAN-SR) 180 MG CR tablet Take 2 tablets (360 mg total) by mouth daily.  . vitamin B-12 (CYANOCOBALAMIN) 1000 MCG tablet Take 1,000 mcg by mouth daily.  . Vitamin D, Ergocalciferol, (DRISDOL) 1.25 MG (50000 UNIT) CAPS capsule TAKE 1 CAPSULE WEEKLY.     Allergies:   Patient has no known allergies.   Social History   Tobacco Use  . Smoking status: Former Smoker    Packs/day: 0.50    Types: Cigarettes    Start date: 1960    Quit date: 1961    Years since quitting: 60.3  . Smokeless tobacco: Never Used  . 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 Alcohol use: Not on file  . Drug use: No     Family Hx: The patient's family history includes Cancer - Lung (age of onset: 548 in his father; Multiple myeloma (age of onset: 514 in his brother; Pneumonia (age of onset: 855 in his mother.  ROS   EKGs/Labs/Other Test Reviewed:    EKG:  EKG is  ordered today.  The ekg ordered today demonstrates atrial flutter, HR 135, QTC 477, nonspecific ST-T wave changes  Recent Labs: 02/19/2020: NT-Pro BNP 721 03/05/2020: ALT 71; B Natriuretic Peptide 355.3; TSH 2.464 03/08/2020: Magnesium 2.1 03/23/2020: BUN 25; Creatinine, Ser 1.03; Hemoglobin 15.2; Platelets 220; Potassium 4.1; Sodium 143   Recent Lipid Panel Lab Results  Component Value Date/Time   CHOL 96 03/06/2020 03:30 AM   TRIG 50 03/06/2020 03:30 AM   HDL 48 03/06/2020 03:30 AM   CHOLHDL 2.0 03/06/2020 03:30 AM   LDLCALC 38 03/06/2020 03:30 AM    Physical Exam:    VS:  BP 132/88   Pulse (!) 135   Ht 5' 10"  (1.778 m)   Wt 210 lb 1.9 oz (95.3 kg)   SpO2 95%   BMI 30.15 kg/m     Wt Readings from Last 3 Encounters:  03/23/20 210 lb 1.9 oz (95.3 kg)  03/08/20 205 lb 12.8 oz (93.4 kg)  03/05/20 219 lb (99.3 kg)     Constitutional:      Appearance: Healthy appearance. Not in distress.  Neck:      Thyroid: No thyromegaly.     Vascular: JVD normal.  Pulmonary:     Effort: Pulmonary effort is normal.     Breath sounds: No wheezing. No rales.  Cardiovascular:  Tachycardia present. Regular rhythm. Normal S1. Normal S2.     Murmurs: There is no murmur.  Edema:    Peripheral edema absent.  Abdominal:     Palpations: Abdomen is soft. There is no hepatomegaly.  Skin:    General: Skin is warm and dry.  Neurological:     General: No focal deficit present.     Mental Status: Alert and oriented to person, place and time.     Cranial Nerves: Cranial nerves are intact.      ASSESSMENT & PLAN:    1. Atypical atrial flutter ([place]) He remains in atrial flutter with rapid ventricular rate.  He is symptomatic with fatigue and shortness of breath.  He does not appear to be significantly volume overloaded.  He did miss 2 doses of Apixaban.  The last missed dose was 2 to 3 days ago.  We discussed the importance of taking uninterrupted anticoagulation prior to proceeding with cardioversion.  It has been less than 3 weeks since he was started on anticoagulation.  I have recommended that we proceed with a transesophageal echocardiogram guided cardioversion.  Risks and benefits have been discussed with him and his wife.  They agree to proceed.  I reviewed this with Dr. Harrington Challenger (attending MD) who agreed.  I will continue his verapamil at 360 mg daily.  Start metoprolol tartrate 25 mg twice daily for better rate control.  We discussed the importance of not missing any further doses of Apixaban between now and his cardioversion.  Obtain BMET, CBC  2. Chronic diastolic CHF (congestive heart failure) (HCC) Volume status seems stable.  Continue current dose of furosemide.  Obtain BMET today.  3. Nonrheumatic aortic valve stenosis Mild by recent echocardiogram.  4. Coronary artery disease involving native coronary artery of native heart without angina pectoris He is not having any anginal symptoms.  Continue  aspirin, statin therapy.  5. Essential hypertension Blood pressure somewhat borderline.  Start metoprolol tartrate as noted above.    Dispo:  Return in about 2 weeks (around 04/06/2020) for Post Procedure Follow Up.   Medication Adjustments/Labs and Tests Ordered: Current medicines are reviewed at length with the patient today.  Concerns regarding medicines are outlined above.  Tests Ordered: Orders Placed This Encounter  Procedures  . Basic metabolic panel  . CBC  . EKG 12-Lead   Medication Changes: Meds ordered this encounter  Medications  . metoprolol tartrate (LOPRESSOR) 25 MG tablet    Sig: Take 1 tablet (25 mg total) by mouth 2 (two) times daily.    Dispense:  180 tablet    Refill:  3    Signed, Richardson Dopp, PA-C  03/23/2020 5:04 PM    [facility] [place], [place], [place]  [address] Phone: [phone]; Fax: [phone]

## 2020-03-23 ENCOUNTER — Ambulatory Visit (INDEPENDENT_AMBULATORY_CARE_PROVIDER_SITE_OTHER): Payer: Medicare Other | Admitting: Physician Assistant

## 2020-03-23 ENCOUNTER — Other Ambulatory Visit: Payer: Self-pay

## 2020-03-23 ENCOUNTER — Encounter: Payer: Self-pay | Admitting: Physician Assistant

## 2020-03-23 VITALS — BP 132/88 | HR 135 | Ht 70.0 in | Wt 210.1 lb

## 2020-03-23 DIAGNOSIS — I2510 Atherosclerotic heart disease of native coronary artery without angina pectoris: Secondary | ICD-10-CM | POA: Diagnosis not present

## 2020-03-23 DIAGNOSIS — I350 Nonrheumatic aortic (valve) stenosis: Secondary | ICD-10-CM

## 2020-03-23 DIAGNOSIS — N401 Enlarged prostate with lower urinary tract symptoms: Secondary | ICD-10-CM | POA: Diagnosis not present

## 2020-03-23 DIAGNOSIS — I10 Essential (primary) hypertension: Secondary | ICD-10-CM | POA: Diagnosis not present

## 2020-03-23 DIAGNOSIS — Z0000 Encounter for general adult medical examination without abnormal findings: Secondary | ICD-10-CM | POA: Diagnosis not present

## 2020-03-23 DIAGNOSIS — I5189 Other ill-defined heart diseases: Secondary | ICD-10-CM | POA: Diagnosis not present

## 2020-03-23 DIAGNOSIS — I484 Atypical atrial flutter: Secondary | ICD-10-CM | POA: Diagnosis not present

## 2020-03-23 DIAGNOSIS — I5032 Chronic diastolic (congestive) heart failure: Secondary | ICD-10-CM | POA: Diagnosis not present

## 2020-03-23 DIAGNOSIS — G4733 Obstructive sleep apnea (adult) (pediatric): Secondary | ICD-10-CM | POA: Diagnosis not present

## 2020-03-23 DIAGNOSIS — G629 Polyneuropathy, unspecified: Secondary | ICD-10-CM | POA: Diagnosis not present

## 2020-03-23 DIAGNOSIS — Z1389 Encounter for screening for other disorder: Secondary | ICD-10-CM | POA: Diagnosis not present

## 2020-03-23 DIAGNOSIS — K219 Gastro-esophageal reflux disease without esophagitis: Secondary | ICD-10-CM | POA: Diagnosis not present

## 2020-03-23 LAB — BASIC METABOLIC PANEL
BUN/Creatinine Ratio: 24 (ref 10–24)
BUN: 25 mg/dL (ref 8–27)
CO2: 31 mmol/L — ABNORMAL HIGH (ref 20–29)
Calcium: 10 mg/dL (ref 8.6–10.2)
Chloride: 102 mmol/L (ref 96–106)
Creatinine, Ser: 1.03 mg/dL (ref 0.76–1.27)
GFR calc Af Amer: 76 mL/min/{1.73_m2} (ref >59)
GFR calc non Af Amer: 66 mL/min/{1.73_m2} (ref >59)
Glucose: 113 mg/dL — ABNORMAL HIGH (ref 65–99)
Potassium: 4.1 mmol/L (ref 3.5–5.2)
Sodium: 143 mmol/L (ref 134–144)

## 2020-03-23 LAB — CBC
Hematocrit: 44.9 % (ref 37.5–51.0)
Hemoglobin: 15.2 g/dL (ref 13.0–17.7)
MCH: 32.5 pg (ref 26.6–33.0)
MCHC: 33.9 g/dL (ref 31.5–35.7)
MCV: 96 fL (ref 79–97)
Platelets: 220 10*3/uL (ref 150–450)
RBC: 4.68 x10E6/uL (ref 4.14–5.80)
RDW: 13.7 % (ref 11.6–15.4)
WBC: 8.3 10*3/uL (ref 3.4–10.8)

## 2020-03-23 MED ORDER — METOPROLOL TARTRATE 25 MG PO TABS: 25.0000 mg | ORAL_TABLET | Freq: Two times a day (BID) | ORAL | 3 refills | Status: DC

## 2020-03-23 NOTE — Patient Instructions (Addendum)
Medication Instructions:   Your physician has recommended you make the following change in your medication:   1) Start Metoprolol 25 mg, 1 tablet by mouth twice a day  *If you need a refill on your cardiac medications before your next appointment, please call your pharmacy*  Lab Work:  You will have labs drawn today: BMET/CBC  Testing/Procedures:  Your physician has recommended that you have a Cardioversion (DCCV). Electrical Cardioversion uses a jolt of electricity to your heart either through paddles or wired patches attached to your chest. This is a controlled, usually prescheduled, procedure. Defibrillation is done under light anesthesia in the hospital, and you usually go home the day of the procedure. This is done to get your heart back into a normal rhythm. You are not awake for the procedure. Please see the instruction sheet given to you today.  Your Pre-procedure COVID-19 Testing will be done on  at [facility] on 03/25/13 at 10:45AM. After your swab you will be given a mask to wear and instructed to go home and quarantine you are not allowed to have visitors until after your procedure. If you test positive you will be notified and your procedure will be cancelled.   You are scheduled for a TEE Cardioversion on 03/27/20 with Dr. Oval Linsey. Please arrive at the [facility] (Main Entrance A) at [facility]: [address] [place], [place] [address] at 6:30AM. (1 hour prior to procedure)  DIET: Nothing to eat or drink after midnight except a sip of water with medications (see medication instructions below)  Medication Instructions: Hold: Furosemide the morning of your procedure.  Continue your anticoagulant: Eliquis  You must have a responsible person to drive you home and stay in the waiting area during your procedure. Failure to do so could result in cancellation.  Bring your insurance cards.  *Special Note: Every effort is made to have your procedure done on time.  Occasionally there are emergencies that occur at the hospital that may cause delays. Please be patient if a delay does occur.   Follow-Up: At [facility], you and your health needs are our priority.  As part of our continuing mission to provide you with exceptional heart care, we have created designated Provider Care Teams.  These Care Teams include your primary Cardiologist (physician) and Advanced Practice Providers (APPs -  Physician Assistants and Nurse Practitioners) who all work together to provide you with the care you need, when you need it.  We recommend signing up for the patient portal called "MyChart".  Sign up information is provided on this After Visit Summary.  MyChart is used to connect with patients for Virtual Visits (Telemedicine).  Patients are able to view lab/test results, encounter notes, upcoming appointments, etc.  Non-urgent messages can be sent to your provider as well.   To learn more about what you can do with MyChart, go to [email or web address].    Your next appointment:    On 04/10/20 at 11:15AM with Richardson Dopp, PA-C

## 2020-03-24 ENCOUNTER — Telehealth: Payer: Self-pay | Admitting: Physician Assistant

## 2020-03-24 NOTE — Telephone Encounter (Signed)
Patient's wife wants to know the time frame for her husbands cardioversion on 03/27/20. She says that she knows she has to be with him but wants to know how long she is looking at staying. She states she has an appt at 11:30am that day and wants to know if she should reschedule the appt.

## 2020-03-24 NOTE — Telephone Encounter (Signed)
I called and spoke with patients wife Dorian Pod, she is aware to reschedule her appointment that day because of patients procedure.

## 2020-03-25 ENCOUNTER — Other Ambulatory Visit: Payer: Self-pay | Admitting: Family Medicine

## 2020-03-25 ENCOUNTER — Other Ambulatory Visit (HOSPITAL_COMMUNITY)
Admission: RE | Admit: 2020-03-25 | Discharge: 2020-03-25 | Disposition: A | Discharge status: Home / Self Care | Payer: Medicare Other | Source: Ambulatory Visit | Attending: Cardiovascular Disease | Admitting: Cardiovascular Disease

## 2020-03-25 DIAGNOSIS — Z01812 Encounter for preprocedural laboratory examination: Secondary | ICD-10-CM | POA: Insufficient documentation

## 2020-03-25 DIAGNOSIS — Z20822 Contact with and (suspected) exposure to covid-19: Secondary | ICD-10-CM | POA: Diagnosis not present

## 2020-03-25 LAB — SARS CORONAVIRUS 2 (TAT 6-24 HRS): SARS Coronavirus 2: NEGATIVE

## 2020-03-27 ENCOUNTER — Ambulatory Visit (HOSPITAL_COMMUNITY)
Admission: RE | Admit: 2020-03-27 | Discharge: 2020-03-27 | Disposition: A | Discharge status: Home / Self Care | Payer: Medicare Other | Attending: Cardiovascular Disease | Admitting: Cardiovascular Disease

## 2020-03-27 ENCOUNTER — Ambulatory Visit (HOSPITAL_BASED_OUTPATIENT_CLINIC_OR_DEPARTMENT_OTHER)
Admission: RE | Admit: 2020-03-27 | Discharge: 2020-03-27 | Disposition: A | Discharge status: Home / Self Care | Payer: Medicare Other | Source: Home / Self Care | Attending: Physician Assistant | Admitting: Physician Assistant

## 2020-03-27 ENCOUNTER — Encounter (HOSPITAL_COMMUNITY)
Admission: RE | Disposition: A | Discharge status: Home / Self Care | Payer: Self-pay | Source: Home / Self Care | Attending: Cardiovascular Disease

## 2020-03-27 ENCOUNTER — Ambulatory Visit (HOSPITAL_COMMUNITY): Payer: Medicare Other | Admitting: Certified Registered Nurse Anesthetist

## 2020-03-27 ENCOUNTER — Other Ambulatory Visit: Payer: Self-pay

## 2020-03-27 ENCOUNTER — Encounter (HOSPITAL_COMMUNITY): Payer: Self-pay | Admitting: Cardiovascular Disease

## 2020-03-27 DIAGNOSIS — Z7901 Long term (current) use of anticoagulants: Secondary | ICD-10-CM | POA: Diagnosis not present

## 2020-03-27 DIAGNOSIS — F329 Major depressive disorder, single episode, unspecified: Secondary | ICD-10-CM | POA: Diagnosis not present

## 2020-03-27 DIAGNOSIS — Z7951 Long term (current) use of inhaled steroids: Secondary | ICD-10-CM | POA: Diagnosis not present

## 2020-03-27 DIAGNOSIS — I110 Hypertensive heart disease with heart failure: Secondary | ICD-10-CM | POA: Insufficient documentation

## 2020-03-27 DIAGNOSIS — Z79899 Other long term (current) drug therapy: Secondary | ICD-10-CM | POA: Diagnosis not present

## 2020-03-27 DIAGNOSIS — Z7982 Long term (current) use of aspirin: Secondary | ICD-10-CM | POA: Insufficient documentation

## 2020-03-27 DIAGNOSIS — I2510 Atherosclerotic heart disease of native coronary artery without angina pectoris: Secondary | ICD-10-CM | POA: Diagnosis not present

## 2020-03-27 DIAGNOSIS — N400 Enlarged prostate without lower urinary tract symptoms: Secondary | ICD-10-CM | POA: Diagnosis not present

## 2020-03-27 DIAGNOSIS — I340 Nonrheumatic mitral (valve) insufficiency: Secondary | ICD-10-CM | POA: Diagnosis not present

## 2020-03-27 DIAGNOSIS — I10 Essential (primary) hypertension: Secondary | ICD-10-CM | POA: Diagnosis not present

## 2020-03-27 DIAGNOSIS — I5033 Acute on chronic diastolic (congestive) heart failure: Secondary | ICD-10-CM | POA: Diagnosis not present

## 2020-03-27 DIAGNOSIS — I080 Rheumatic disorders of both mitral and aortic valves: Secondary | ICD-10-CM | POA: Diagnosis not present

## 2020-03-27 DIAGNOSIS — I484 Atypical atrial flutter: Secondary | ICD-10-CM

## 2020-03-27 DIAGNOSIS — G4730 Sleep apnea, unspecified: Secondary | ICD-10-CM | POA: Diagnosis not present

## 2020-03-27 DIAGNOSIS — I700 Atherosclerosis of aorta: Secondary | ICD-10-CM | POA: Insufficient documentation

## 2020-03-27 DIAGNOSIS — Z87891 Personal history of nicotine dependence: Secondary | ICD-10-CM | POA: Insufficient documentation

## 2020-03-27 DIAGNOSIS — I4892 Unspecified atrial flutter: Secondary | ICD-10-CM | POA: Diagnosis not present

## 2020-03-27 DIAGNOSIS — I350 Nonrheumatic aortic (valve) stenosis: Secondary | ICD-10-CM | POA: Diagnosis not present

## 2020-03-27 HISTORY — PX: CARDIOVERSION: SHX1299

## 2020-03-27 HISTORY — PX: TEE WITHOUT CARDIOVERSION: SHX5443

## 2020-03-27 SURGERY — ECHOCARDIOGRAM, TRANSESOPHAGEAL
Anesthesia: Monitor Anesthesia Care

## 2020-03-27 MED ORDER — HYDROCORTISONE 1 % EX CREA
1.0000 "application " | TOPICAL_CREAM | Freq: Three times a day (TID) | CUTANEOUS | Status: DC | PRN
  Filled 2020-03-27: qty 28

## 2020-03-27 MED ORDER — SODIUM CHLORIDE 0.9 % IV SOLN
INTRAVENOUS | Status: DC
  Administered 2020-03-27: 500 mL via INTRAVENOUS

## 2020-03-27 MED ORDER — LIDOCAINE 2% (20 MG/ML) 5 ML SYRINGE
INTRAMUSCULAR | Status: DC | PRN
  Administered 2020-03-27: 40 mg via INTRAVENOUS
  Administered 2020-03-27: 20 mg via INTRAVENOUS

## 2020-03-27 MED ORDER — PROPOFOL 10 MG/ML IV BOLUS
INTRAVENOUS | Status: DC | PRN
  Administered 2020-03-27: 20 mg via INTRAVENOUS
  Administered 2020-03-27: 10 mg via INTRAVENOUS

## 2020-03-27 MED ORDER — PROPOFOL 500 MG/50ML IV EMUL
INTRAVENOUS | Status: DC | PRN
  Administered 2020-03-27: 100 ug/kg/min via INTRAVENOUS

## 2020-03-27 MED ORDER — PHENYLEPHRINE 40 MCG/ML (10ML) SYRINGE FOR IV PUSH (FOR BLOOD PRESSURE SUPPORT)
PREFILLED_SYRINGE | INTRAVENOUS | Status: DC | PRN
  Administered 2020-03-27: 80 ug via INTRAVENOUS

## 2020-03-27 NOTE — Anesthesia Procedure Notes (Signed)
Procedure Name: MAC Date/Time: 03/27/2020 7:55 AM Performed by: Janene Harvey, CRNA Pre-anesthesia Checklist: Patient identified, Emergency Drugs available, Suction available and Patient being monitored Oxygen Delivery Method: Nasal cannula Placement Confirmation: positive ETCO2 Dental Injury: Teeth and Oropharynx as per pre-operative assessment

## 2020-03-27 NOTE — CV Procedure (Signed)
Brief TEE Note  LVEF >55% No LA/LAA thrombus or mass Mitral annular calcification with degenerative mitral valve disease and moderate mitral regurgitation Aortic valve calcification and thickening Mild aortic regurgitation and mild aortic stenosis  Electrical Cardioversion Procedure Note Patrick Franco [id number] 06/08/1932  Procedure: Electrical Cardioversion Indications:  Atrial Flutter  Procedure Details Consent: Risks of procedure as well as the alternatives and risks of each were explained to the (patient/caregiver).  Consent for procedure obtained. Time Out: Verified patient identification, verified procedure, site/side was marked, verified correct patient position, special equipment/implants available, medications/allergies/relevent history reviewed, required imaging and test results available.  Performed  Patient placed on cardiac monitor, pulse oximetry, supplemental oxygen as necessary.  Sedation given: propofol Pacer pads placed anterior and posterior chest.  Cardioverted 1 time(s).  Cardioverted at 150J.  Evaluation Findings: Post procedure EKG shows: NSR Complications: None Patient did tolerate procedure well.   Skeet Latch, MD 03/27/2020, 8:24 AM

## 2020-03-27 NOTE — Transfer of Care (Signed)
Immediate Anesthesia Transfer of Care Note  Patient: Patrick Franco  Procedure(s) Performed: TRANSESOPHAGEAL ECHOCARDIOGRAM (TEE) (N/A ) CARDIOVERSION (N/A )  Patient Location: Endoscopy Unit  Anesthesia Type:MAC  Level of Consciousness: drowsy  Airway & Oxygen Therapy: Patient Spontanous Breathing and Patient connected to nasal cannula oxygen  Post-op Assessment: Report given to RN and Post -op Vital signs reviewed and stable  Post vital signs: Reviewed  Last Vitals:  Vitals Value Taken Time  BP    Temp    Pulse    Resp    SpO2      Last Pain:  Vitals:   03/27/20 0655  TempSrc: Oral  PainSc: 0-No pain         Complications: No apparent anesthesia complications

## 2020-03-27 NOTE — Interval H&P Note (Signed)
History and Physical Interval Note:  03/27/2020 7:51 AM  Patrick Franco  has presented today for surgery, with the diagnosis of ATRIAL FLUTTER.  The various methods of treatment have been discussed with the patient and family. After consideration of risks, benefits and other options for treatment, the patient has consented to  Procedure(s): TRANSESOPHAGEAL ECHOCARDIOGRAM (TEE) (N/A) CARDIOVERSION (N/A) as a surgical intervention.  The patient's history has been reviewed, patient examined, no change in status, stable for surgery.  I have reviewed the patient's chart and labs.  Questions were answered to the patient's satisfaction.     Skeet Latch, MD

## 2020-03-27 NOTE — Anesthesia Postprocedure Evaluation (Signed)
Anesthesia Post Note  Patient: Patrick Franco  Procedure(s) Performed: TRANSESOPHAGEAL ECHOCARDIOGRAM (TEE) (N/A ) CARDIOVERSION (N/A )     Patient location during evaluation: Endoscopy Anesthesia Type: MAC Level of consciousness: awake and alert Pain management: pain level controlled Vital Signs Assessment: post-procedure vital signs reviewed and stable Respiratory status: spontaneous breathing, nonlabored ventilation, respiratory function stable and patient connected to nasal cannula oxygen Cardiovascular status: stable and blood pressure returned to baseline Postop Assessment: no apparent nausea or vomiting Anesthetic complications: no    Last Vitals:  Vitals:   03/27/20 0835 03/27/20 0850  BP: 118/77 132/76  Pulse: 78 80  Resp: 20 19  Temp:    SpO2: 98% 97%    Last Pain:  Vitals:   03/27/20 0850  TempSrc:   PainSc: 0-No pain                 , COKER

## 2020-03-27 NOTE — Anesthesia Preprocedure Evaluation (Addendum)
Anesthesia Evaluation  Patient identified by MRN, date of birth, ID band Patient awake    Reviewed: Allergy & Precautions, NPO status , Patient's Chart, lab work & pertinent test results  Airway Mallampati: II  TM Distance: >3 FB Neck ROM: Full    Dental  (+) Teeth Intact, Dental Advisory Given   Pulmonary former smoker,    breath sounds clear to auscultation       Cardiovascular hypertension,  Rhythm:Regular Rate:Normal     Neuro/Psych    GI/Hepatic   Endo/Other    Renal/GU      Musculoskeletal   Abdominal (+) - obese,   Peds  Hematology   Anesthesia Other Findings   Reproductive/Obstetrics                            Anesthesia Physical Anesthesia Plan  ASA: III  Anesthesia Plan: MAC   Post-op Pain Management:    Induction: Intravenous  PONV Risk Score and Plan: Ondansetron and Dexamethasone  Airway Management Planned: Natural Airway and Nasal Cannula  Additional Equipment:   Intra-op Plan:   Post-operative Plan:   Informed Consent: I have reviewed the patients History and Physical, chart, labs and discussed the procedure including the risks, benefits and alternatives for the proposed anesthesia with the patient or authorized representative who has indicated his/her understanding and acceptance.     Dental advisory given  Plan Discussed with: CRNA and Anesthesiologist  Anesthesia Plan Comments:         Anesthesia Quick Evaluation

## 2020-03-27 NOTE — Progress Notes (Signed)
  Echocardiogram Echocardiogram Transesophageal has been performed.  Patrick Franco 03/27/2020, 8:52 AM

## 2020-03-27 NOTE — Discharge Instructions (Signed)
Electrical Cardioversion   What can I expect after the procedure?  Your blood pressure, heart rate, breathing rate, and blood oxygen level will be monitored until you leave the hospital or clinic.  Your heart rhythm will be watched to make sure it does not change.  You may have some redness on the skin where the shocks were given. Follow these instructions at home:  Do not drive for 24 hours if you were given a sedative during your procedure.  Take over-the-counter and prescription medicines only as told by your health care provider.  Ask your health care provider how to check your pulse. Check it often.  Rest for 48 hours after the procedure or as told by your health care provider.  Avoid or limit your caffeine use as told by your health care provider.  Keep all follow-up visits as told by your health care provider. This is important. Contact a health care provider if:  You feel like your heart is beating too quickly or your pulse is not regular.  You have a serious muscle cramp that does not go away. Get help right away if:  You have discomfort in your chest.  You are dizzy or you feel faint.  You have trouble breathing or you are short of breath.  Your speech is slurred.  You have trouble moving an arm or leg on one side of your body.  Your fingers or toes turn cold or blue. Summary  Electrical cardioversion is the delivery of a jolt of electricity to restore a normal rhythm to the heart.  This procedure may be done right away in an emergency or may be a scheduled procedure if the condition is not an emergency.  Generally, this is a safe procedure.  After the procedure, check your pulse often as told by your health care provider. This information is not intended to replace advice given to you by your health care provider. Make sure you discuss any questions you have with your health care provider. Document Revised: 06/03/2019 Document Reviewed:  06/03/2019 Elsevier Patient Education  2020 Elsevier Inc.  

## 2020-03-28 ENCOUNTER — Telehealth: Payer: Self-pay | Admitting: Physician Assistant

## 2020-03-28 NOTE — Telephone Encounter (Signed)
Patient had a TEE cardioversion yesterday.  TEE showed normal EF, moderate MR.  Cardioversion was successful.  Patient called today says he still does not have a whole lot of energy.  Blood pressure was 137/90.  Heart rate was 62-65.  O2 saturation 92-93%.  Vital signs seems to be pretty stable.  I recommended he rest the low bit more and see if the symptoms would get better.  If symptoms still does not improve by Monday, he is to give [place] a call and arrange a earlier visit.

## 2020-03-30 ENCOUNTER — Telehealth: Payer: Self-pay | Admitting: Cardiovascular Disease

## 2020-03-30 MED ORDER — METOPROLOL TARTRATE 25 MG PO TABS
12.5000 mg | ORAL_TABLET | Freq: Two times a day (BID) | ORAL | 3 refills | Status: DC
Start: 2020-03-30 — End: 2020-05-14

## 2020-03-30 NOTE — Telephone Encounter (Signed)
Called patient with advice to reduce metoprolol to 12.5 mg twice daily. Patient verified that he has pills that he can cut in half. He states he is not currently having any syncope, dizziness, or light-headedness. I asked if he feels like he has gone back into a fib and he states he has never been able to tell that he was in a fib. I advised that we can have him come in for an EKG if he feels that he is in a fib or does not feel better with medication change. He denies that he feels worse than prior to his cardioversion. He states he is supposed to leave for the beach tomorrow. I advised him to call back if his symptoms do not improve or if he has other concerns and he thanked me for the call.

## 2020-03-30 NOTE — Telephone Encounter (Signed)
Called patient to follow-up on message from 5/15 with advice from Richardson Dopp, [place]: Agree. Please contact patient to see how he is feeling.  If still low energy, decrease Metoprolol to 12.5 mg twice daily. If any near syncope or dizziness, arrange earlier follow up with ECG (today or tomorrow). Richardson Dopp, PA-C    03/30/2020 9:27 AM    When I spoke with patient, he reported that he called this morning and spoke with Alden Hipp., RN. I apologized for not seeing that call prior to calling him. I advised that I will update the call that was routed to Dr. Angelena Form.  He is aware we will call back later with Dr. Camillia Herter advice. He thanked me for the call.

## 2020-03-30 NOTE — Telephone Encounter (Signed)
Pt called to report that he had a TEE/ Cardioversion this past Friday 03/27/20 and he has not noticed any improvement in his symptoms.   He says he is very fatigued and has no energy. He gets very "short winded with minimal exertion", he says he is not sure if his CPAP is working well because he is "gasping for breath" at night so he is not sleeping well.   Pt denies chest pain, no pain with inspiration, no dizzines or palpitations. He does not know if he is in or out of rhythm but he says that he did not know prior to his cardioversion. No edema of his lower extremities.   Hi BP 140/85 and HR 80, O2sat 91%.   Pt started his beta blockers per him when he was in the hospital in April.   Will forward to Dr. Angelena Form for his review and recommendations. Pt not seeing Richardson Dopp PA until 04/17/20.

## 2020-03-30 NOTE — Telephone Encounter (Signed)
New message      Pts wife is calling and says he has questions from cardioversion on Friday  Pt is lethargic, he says he is not feeling good and feels worse then he did before. He says he has not been sleeping well and feels concerned.      Please call

## 2020-03-30 NOTE — Telephone Encounter (Signed)
Please see advice received by triage from Richardson Dopp, [place] in regards to the call from patient to on-call provider on 5/15: Agree. Please contact patient to see how he is feeling.  If still low energy, decrease Metoprolol to 12.5 mg twice daily. If any near syncope or dizziness, arrange earlier follow up with ECG (today or tomorrow). Richardson Dopp, PA-C    03/30/2020 9:27 AM

## 2020-03-30 NOTE — Telephone Encounter (Signed)
Agree. Thanks

## 2020-03-30 NOTE — Telephone Encounter (Signed)
Agree. Please contact patient to see how he is feeling.  If still low energy, decrease Metoprolol to 12.5 mg twice daily. If any near syncope or dizziness, arrange earlier follow up with ECG (today or tomorrow). Richardson Dopp, PA-C    03/30/2020 9:27 AM

## 2020-03-31 ENCOUNTER — Telehealth (HOSPITAL_COMMUNITY): Payer: Self-pay | Admitting: Cardiovascular Disease

## 2020-03-31 DIAGNOSIS — L812 Freckles: Secondary | ICD-10-CM | POA: Diagnosis not present

## 2020-03-31 DIAGNOSIS — L570 Actinic keratosis: Secondary | ICD-10-CM | POA: Diagnosis not present

## 2020-03-31 DIAGNOSIS — D1801 Hemangioma of skin and subcutaneous tissue: Secondary | ICD-10-CM | POA: Diagnosis not present

## 2020-03-31 DIAGNOSIS — L821 Other seborrheic keratosis: Secondary | ICD-10-CM | POA: Diagnosis not present

## 2020-03-31 DIAGNOSIS — Z85828 Personal history of other malignant neoplasm of skin: Secondary | ICD-10-CM | POA: Diagnosis not present

## 2020-03-31 NOTE — Telephone Encounter (Signed)
Order for echo in July was based on 06/12/19 echo which recommended one year follow up.  Does not need this July now as echo and TEE have been done in April and May 2021

## 2020-03-31 NOTE — Telephone Encounter (Signed)
Patient has an order in for July for echocardiogram per Dr. Angelena Form. Patient had echo and TEE in April.. Does patient still need this echo?

## 2020-04-10 ENCOUNTER — Ambulatory Visit: Payer: Medicare Other | Admitting: Physician Assistant

## 2020-04-10 DIAGNOSIS — I5189 Other ill-defined heart diseases: Secondary | ICD-10-CM | POA: Diagnosis not present

## 2020-04-10 DIAGNOSIS — I10 Essential (primary) hypertension: Secondary | ICD-10-CM | POA: Diagnosis not present

## 2020-04-14 ENCOUNTER — Telehealth: Payer: Self-pay | Admitting: Physician Assistant

## 2020-04-14 NOTE — Telephone Encounter (Signed)
I called pt's Wife back and had to LM.. I will call her back tomorrow.

## 2020-04-14 NOTE — Telephone Encounter (Signed)
   Pt's wife calling requesting to come ine with pt to his appt on 04/17/20. She said to be there to be his extra pair of ears.   Please advise

## 2020-04-15 ENCOUNTER — Other Ambulatory Visit: Payer: Self-pay | Admitting: Family Medicine

## 2020-04-16 NOTE — Progress Notes (Signed)
Cardiology Office Note:    Date:  04/17/2020   ID:  Patrick Franco, DOB 1932-06-28, MRN [id number]  PCP:  Patrick Orn, MD  Cardiologist:  Patrick Chandler, MD   Electrophysiologist:  None   Referring MD: Patrick Orn, MD   Chief Complaint:  Follow-up (AFlutter s/p TEE-DCCV)    Patient Profile:    Patrick Franco is a 86 y.o. male with:   Coronary artery disease ? CT 12/2017: LM and 3 v coronary atherosclerosis  ? Myoview 03/2017: no ischemia   Aortic stenosis  ? Echocardiogram 02/2020: EF 60-65, mild AS (mean 17 mmHg), mild MVP w mild MR  Diastolic CHF  Atrial Flutter ? New onset 02/2020 >> c/b acute diastolic CHF; admitted to hosp ? S/p TEE-DCCV 03/2020  Hypertension   MVP ? Mild MR Echocardiogram 02/2020  Sleep apnea   PACs  Aortic atherosclerosis (CT 12/2017)    Prior CV studies: TEE 03/27/20 EF 60-65, normal RVSF, mild RAE, severe LAE, mod MR, mild AI, mild dilation of asc Ao  Echocardiogram 03/05/2020 EF 60-65, no RWMA, severe LVH, normal RVSF, severe LAE, mod RAE, mild MVP with mild MR, mild AI, mild AS (mean 17 mmHg).    Myoview 04/07/17 EF 61, no ischemia or infarction.  Low Risk    History of Present Illness:    Patrick Franco was last seen in clinic 03/23/2020 for post hospital follow up.  He had been admitted with AFlutter with RVR c/b decompensated diastolic CHF.  The original plan was for rate control and anticoagulation for 3 weeks before proceeding with DCCV.  When I saw him, his rate was still very fast and he was symptomatic. So, I set him up for a TEE-DCCV.  He was successfully cardioverted to normal sinus rhythm 03/27/2020.  Since then, he has called in with weakness/fatigue.  We cut back on his Metoprolol tartrate to 12.5 mg twice daily.  He returns for follow up.     He is here today with his wife.  He had several good days after his cardioversion.  However, since then, he has not felt well.  He has been more short of breath with exertion.   He has not  had chest pain, syncope.  He has had more difficulty sleeping at night.  He has some mild lower extremity swelling.  He has not had any bleeding issues.  Past Medical History:  Diagnosis Date   Aortic stenosis    Arthritis    BPH (benign prostatic hyperplasia)    Chronic low back pain    Depression    Diverticulitis    Elevated PSA    Foot drop, right    Gait disorder 02/03/2015   Hip pain    History of colonoscopy    HOH (hard of hearing)    hearing aid   Hypertension    Lumbar radiculopathy    Mitral valve prolapse    Onychomycosis    Peptic ulcer disease    Sepsis ([place]) 2005   e. coli-after prostate bx   Sleep apnea    Torn ACL    Tracheobronchomalacia    seen on 2019 Chest CT    Current Medications: Current Meds  Medication Sig   allopurinol (ZYLOPRIM) 100 MG tablet TAKE 1 TABLET ONCE DAILY. (Patient taking differently: Take 100 mg by mouth daily. )   apixaban (ELIQUIS) 5 MG TABS tablet Take 1 tablet (5 mg total) by mouth 2 (two) times daily.   aspirin EC 81 MG tablet Take  81 mg by mouth daily.   atorvastatin (LIPITOR) 10 MG tablet Take 10 mg by mouth daily.    colchicine 0.6 MG tablet Take 1 tablet (0.6 mg total) by mouth 2 (two) times daily as needed. (Patient taking differently: Take 0.6 mg by mouth 2 (two) times daily as needed (Gout). )   dutasteride (AVODART) 0.5 MG capsule Take 0.5 mg by mouth daily.   fluticasone furoate-vilanterol (BREO ELLIPTA) 100-25 MCG/INH AEPB Inhale 1 puff into the lungs daily.   metoprolol tartrate (LOPRESSOR) 25 MG tablet Take 0.5 tablets (12.5 mg total) by mouth 2 (two) times daily.   mirtazapine (REMERON) 30 MG tablet TAKE ONE TABLET AT BEDTIME.   Multiple Vitamin (MULTIVITAMIN) tablet Take 1 tablet by mouth daily.   Omega-3 Fatty Acids (FISH OIL PO) Take 1,000 mg by mouth daily.   Polyethyl Glycol-Propyl Glycol (SYSTANE) 0.4-0.3 % SOLN Place 1 drop into both eyes daily as needed (Dry eye).    potassium chloride (KLOR-CON) 10 MEQ tablet Take 10 mEq by mouth daily.   Probiotic Product (PROBIOTIC DAILY PO) Take 1 tablet by mouth daily.   pyridOXINE (VITAMIN B-6) 100 MG tablet Take 100 mg by mouth daily.   Respiratory Therapy Supplies (FLUTTER) DEVI Use the flutter valve 10 times when doing it daily.   tamsulosin (FLOMAX) 0.4 MG CAPS capsule Take 0.4 mg by mouth daily.   Turmeric 500 MG CAPS Take 500 mg by mouth daily.   verapamil (CALAN-SR) 180 MG CR tablet Take 2 tablets (360 mg total) by mouth daily.   vitamin B-12 (CYANOCOBALAMIN) 1000 MCG tablet Take 1,000 mcg by mouth daily.   Vitamin D, Ergocalciferol, (DRISDOL) 1.25 MG (50000 UNIT) CAPS capsule TAKE 1 CAPSULE WEEKLY.   [DISCONTINUED] furosemide (LASIX) 40 MG tablet Take 1 tablet (40 mg total) by mouth daily.     Allergies:   Patient has no known allergies.   Social History   Tobacco Use   Smoking status: Former Smoker    Packs/day: 0.50    Types: Cigarettes    Start date: 1960    Quit date: 1961    Years since quitting: 60.4   Smokeless tobacco: Never Used   Tobacco comment: Pt smoked for 39month when in [employer] Substance Use Topics   Alcohol use: Not on file   Drug use: No     Family Hx: The patient's family history includes Cancer - Lung (age of onset: 557 in his father; Multiple myeloma (age of onset: 550 in his brother; Pneumonia (age of onset: 840 in his mother.  Review of Systems  Constitution: Positive for malaise/fatigue.  Respiratory: Positive for shortness of breath.   Gastrointestinal: Negative for hematochezia and melena.  Genitourinary: Negative for hematuria.     EKGs/Labs/Other Test Reviewed:    EKG:  EKG is   ordered today.  The ekg ordered today demonstrates atrial fibrillation, HR 70, normal axis, septal Q waves, no ST-T wave changes, QTC 432  Recent Labs: 02/19/2020: NT-Pro BNP 721 03/05/2020: ALT 71; B Natriuretic Peptide 355.3; TSH 2.464 03/08/2020: Magnesium 2.1 03/23/2020:  BUN 25; Creatinine, Ser 1.03; Hemoglobin 15.2; Platelets 220; Potassium 4.1; Sodium 143   Recent Lipid Panel Lab Results  Component Value Date/Time   CHOL 96 03/06/2020 03:30 AM   TRIG 50 03/06/2020 03:30 AM   HDL 48 03/06/2020 03:30 AM   CHOLHDL 2.0 03/06/2020 03:30 AM   LDLCALC 38 03/06/2020 03:30 AM    Physical Exam:    VS:  BP 120/82  Pulse 70    Ht 5' 10"  (1.778 m)    Wt 215 lb 12.8 oz (97.9 kg)    BMI 30.96 kg/m     Wt Readings from Last 3 Encounters:  04/17/20 215 lb 12.8 oz (97.9 kg)  03/27/20 208 lb (94.3 kg)  03/23/20 210 lb 1.9 oz (95.3 kg)     Constitutional:      Appearance: Healthy appearance. Not in distress.  Neck:     Thyroid: No thyromegaly.     Vascular: JVD normal elevated with 6 cm of water.  Pulmonary:     Effort: Pulmonary effort is normal.     Breath sounds: No wheezing. No rales.  Cardiovascular:     Normal rate. Irregularly irregular rhythm. Normal S1. Normal S2.     Murmurs: There is a grade 2/6 systolic murmur at the ULSB.  Edema:    Pretibial: bilateral trace edema of the pretibial area. Abdominal:     Palpations: Abdomen is soft. There is no hepatomegaly.  Skin:    General: Skin is warm and dry.  Neurological:     General: No focal deficit present.     Mental Status: Alert and oriented to person, place and time.     Cranial Nerves: Cranial nerves are intact.       ASSESSMENT & PLAN:    1. Atrial fibrillation/flutter [facility]) He is status post TEE guided cardioversion.  He did have restoration of normal sinus rhythm.  It sounds as though he did have improved symptoms for a short period of time.  Today, he is in atrial fibrillation with well-controlled heart rate.  However, he is quite symptomatic with atrial fibrillation.  He also has evidence of volume excess.  Therefore, rhythm control seems to be the most appropriate.  We discussed the possibility of starting amiodarone.  However, he has a history of tracheobronchomalacia.  He saw  pulmonology in April.  PFTs did demonstrate moderately reduced diffusion capacity.  Therefore, it is not clear to me that he is a good candidate for amiodarone.  Therefore, we should consider dofetilide.  I have recommended that we refer him to the atrial fibrillation clinic to consider starting dofetilide.  Continue current dose of verapamil, metoprolol tartrate.  Continue Apixaban.  -Refer to atrial fibrillation clinic   2. Acute on chronic diastolic CHF (congestive heart failure) ([place]) He has evidence of volume excess on exam today.  This is likely exacerbated by atrial fibrillation.  Likely, his heart rate is well controlled.  Increase furosemide to 40 mg twice daily x3 days, then reduce to 60 mg daily.  Increase potassium to 10 mEq twice daily.  Obtain BMET 1 week.  3. Coronary artery disease involving native coronary artery of native heart without angina pectoris He is not having anginal symptoms.  Continue statin therapy.  4. Essential hypertension The patient's blood pressure is controlled on his current regimen.  Continue current therapy.      Dispo:  Return for AFib Clinic evaluation.   Medication Adjustments/Labs and Tests Ordered: Current medicines are reviewed at length with the patient today.  Concerns regarding medicines are outlined above.  Tests Ordered: Orders Placed This Encounter  Procedures   Basic metabolic panel   Amb Referral to AFIB Clinic   EKG 12-Lead   Medication Changes: Meds ordered this encounter  Medications   furosemide (LASIX) 40 MG tablet    Sig: Take 1.5 tablets (60 mg total) by mouth daily. Take 1 tablet (16m) twice daily for 3  days then 1 1/2 tablets (24m) daily    Dispense:  135 tablet    Refill:  3    Signed, SRichardson Dopp PA-C  04/17/2020 2:09 PM    [facility] [address] [place]  [id number]: [phone] Fax: [phone]

## 2020-04-17 ENCOUNTER — Ambulatory Visit (INDEPENDENT_AMBULATORY_CARE_PROVIDER_SITE_OTHER): Payer: Medicare Other | Admitting: Physician Assistant

## 2020-04-17 ENCOUNTER — Other Ambulatory Visit: Payer: Self-pay

## 2020-04-17 ENCOUNTER — Encounter: Payer: Self-pay | Admitting: Physician Assistant

## 2020-04-17 VITALS — BP 120/82 | HR 70 | Ht 70.0 in | Wt 215.8 lb

## 2020-04-17 DIAGNOSIS — I2510 Atherosclerotic heart disease of native coronary artery without angina pectoris: Secondary | ICD-10-CM | POA: Diagnosis not present

## 2020-04-17 DIAGNOSIS — I10 Essential (primary) hypertension: Secondary | ICD-10-CM

## 2020-04-17 DIAGNOSIS — I4891 Unspecified atrial fibrillation: Secondary | ICD-10-CM | POA: Diagnosis not present

## 2020-04-17 DIAGNOSIS — I5033 Acute on chronic diastolic (congestive) heart failure: Secondary | ICD-10-CM

## 2020-04-17 DIAGNOSIS — IMO0002 Reserved for concepts with insufficient information to code with codable children: Secondary | ICD-10-CM

## 2020-04-17 DIAGNOSIS — I4892 Unspecified atrial flutter: Secondary | ICD-10-CM

## 2020-04-17 MED ORDER — FUROSEMIDE 40 MG PO TABS
60.0000 mg | ORAL_TABLET | Freq: Every day | ORAL | 3 refills | Status: DC
Start: 2020-04-17 — End: 2020-05-14

## 2020-04-17 NOTE — Patient Instructions (Signed)
Medication Instructions:  Your physician has recommended you make the following change in your medication:   1) Increase Lasix to 40mg  twice a day for 3 days then take 60mg  daily 2) Increase Potassium to 2meq twice a day  *If you need a refill on your cardiac medications before your next appointment, please call your pharmacy*   Lab Work: BMET in 1 week  If you have labs (blood work) drawn today and your tests are completely normal, you will receive your results only by: [name] MyChart Message (if you have MyChart) OR . A paper copy in the mail If you have any lab test that is abnormal or we need to change your treatment, we will call you to review the results.   Testing/Procedures: None   Follow-Up: At [facility], you and your health needs are our priority.  As part of our continuing mission to provide you with exceptional heart care, we have created designated Provider Care Teams.  These Care Teams include your primary Cardiologist (physician) and Advanced Practice Providers (APPs -  Physician Assistants and Nurse Practitioners) who all work together to provide you with the care you need, when you need it.  We recommend signing up for the patient portal called "MyChart".  Sign up information is provided on this After Visit Summary.  MyChart is used to connect with patients for Virtual Visits (Telemedicine).  Patients are able to view lab/test results, encounter notes, upcoming appointments, etc.  Non-urgent messages can be sent to your provider as well.   To learn more about what you can do with MyChart, go to [email or web address].    Your next appointment:    Richardson Dopp, PA on 06/15/2020 @ 9:15  Other Instructions

## 2020-04-20 ENCOUNTER — Ambulatory Visit (HOSPITAL_COMMUNITY)
Admission: RE | Admit: 2020-04-20 | Discharge: 2020-04-20 | Disposition: A | Discharge status: Home / Self Care | Payer: Medicare Other | Source: Ambulatory Visit | Attending: Nurse Practitioner | Admitting: Nurse Practitioner

## 2020-04-20 ENCOUNTER — Other Ambulatory Visit: Payer: Self-pay

## 2020-04-20 VITALS — BP 116/70 | HR 68 | Ht 70.0 in | Wt 211.6 lb

## 2020-04-20 DIAGNOSIS — N400 Enlarged prostate without lower urinary tract symptoms: Secondary | ICD-10-CM | POA: Diagnosis not present

## 2020-04-20 DIAGNOSIS — I350 Nonrheumatic aortic (valve) stenosis: Secondary | ICD-10-CM | POA: Insufficient documentation

## 2020-04-20 DIAGNOSIS — IMO0002 Reserved for concepts with insufficient information to code with codable children: Secondary | ICD-10-CM

## 2020-04-20 DIAGNOSIS — Z7901 Long term (current) use of anticoagulants: Secondary | ICD-10-CM | POA: Insufficient documentation

## 2020-04-20 DIAGNOSIS — Z96641 Presence of right artificial hip joint: Secondary | ICD-10-CM | POA: Insufficient documentation

## 2020-04-20 DIAGNOSIS — I5030 Unspecified diastolic (congestive) heart failure: Secondary | ICD-10-CM | POA: Diagnosis not present

## 2020-04-20 DIAGNOSIS — I110 Hypertensive heart disease with heart failure: Secondary | ICD-10-CM | POA: Diagnosis not present

## 2020-04-20 DIAGNOSIS — I341 Nonrheumatic mitral (valve) prolapse: Secondary | ICD-10-CM | POA: Insufficient documentation

## 2020-04-20 DIAGNOSIS — Z87891 Personal history of nicotine dependence: Secondary | ICD-10-CM | POA: Insufficient documentation

## 2020-04-20 DIAGNOSIS — M1990 Unspecified osteoarthritis, unspecified site: Secondary | ICD-10-CM | POA: Diagnosis not present

## 2020-04-20 DIAGNOSIS — G4730 Sleep apnea, unspecified: Secondary | ICD-10-CM | POA: Insufficient documentation

## 2020-04-20 DIAGNOSIS — Z7951 Long term (current) use of inhaled steroids: Secondary | ICD-10-CM | POA: Insufficient documentation

## 2020-04-20 DIAGNOSIS — D6869 Other thrombophilia: Secondary | ICD-10-CM

## 2020-04-20 DIAGNOSIS — J984 Other disorders of lung: Secondary | ICD-10-CM | POA: Diagnosis not present

## 2020-04-20 DIAGNOSIS — I340 Nonrheumatic mitral (valve) insufficiency: Secondary | ICD-10-CM | POA: Diagnosis not present

## 2020-04-20 DIAGNOSIS — Z96651 Presence of right artificial knee joint: Secondary | ICD-10-CM | POA: Diagnosis not present

## 2020-04-20 DIAGNOSIS — Z79899 Other long term (current) drug therapy: Secondary | ICD-10-CM | POA: Diagnosis not present

## 2020-04-20 DIAGNOSIS — I4891 Unspecified atrial fibrillation: Secondary | ICD-10-CM | POA: Diagnosis not present

## 2020-04-20 DIAGNOSIS — Z7982 Long term (current) use of aspirin: Secondary | ICD-10-CM | POA: Insufficient documentation

## 2020-04-20 DIAGNOSIS — Z8711 Personal history of peptic ulcer disease: Secondary | ICD-10-CM | POA: Diagnosis not present

## 2020-04-20 DIAGNOSIS — I4892 Unspecified atrial flutter: Secondary | ICD-10-CM | POA: Diagnosis not present

## 2020-04-20 NOTE — Progress Notes (Signed)
Primary Care Physician: Patrick Orn, MD Referring Physician: Richardson Dopp, PA Cardiologist: Dr. Salli Franco Patrick Franco is a 86 y.o. male with a h/o CAD, diastolic HF, aortic stenosis,MVP, HTN,  seen by Dr. Burt Franco on 03/05/2020. He reported feeling terrible. He had gained 10-15 lbs over the past two weeks. He had dyspnea with minimal exertion. He had lower extremity edema and abdominal bloating. He denied  chest pain, dizziness, near syncope, syncope, or awareness of his heart racing.    EKG in the office showed atrial flutter with RVR.Patient was directly admitted for IV diuresis and management of atrial flutter.  he had successful TEE/DCCV. He saw Patrick Dopp PA on f/u. He still had shortness of breath and fatigue. He was found to be back in afib. He suggested AAD therapy.  He felt that his pulmonary issues may make amiodarone not a good option. He suggested Tikosyn  and asked him to f/u here for further discussion.   Patrick Franco also increased his lasix for several days. Weight is down a few lbs but still has fatigue and shortness of breath. EKg today shows afib at 68 bpm.  Today, he denies symptoms of palpitations, chest pain, shortness of breath, orthopnea, PND, lower extremity edema, dizziness, presyncope, syncope, or neurologic sequela. The patient is tolerating medications without difficulties and is otherwise without complaint today.   Past Medical History:  Diagnosis Date  . Aortic stenosis   . Arthritis   . BPH (benign prostatic hyperplasia)   . Chronic low back pain   . Depression   . Diverticulitis   . Elevated PSA   . Foot drop, right   . Gait disorder 02/03/2015  . Hip pain   . History of colonoscopy   . HOH (hard of hearing)    hearing aid  . Hypertension   . Lumbar radiculopathy   . Mitral valve prolapse   . Onychomycosis   . Peptic ulcer disease   . Sepsis ([place]) 2005   e. coli-after prostate bx  . Sleep apnea   . Torn ACL   . Tracheobronchomalacia    seen on  2019 Chest CT   Past Surgical History:  Procedure Laterality Date  . CARDIOVERSION N/A 03/27/2020   Procedure: CARDIOVERSION;  Surgeon: Patrick Latch, MD;  Location: [place];  Service: Cardiovascular;  Laterality: N/A;  . CATARACT EXTRACTION Bilateral   . ESOPHAGOGASTRODUODENOSCOPY N/A 08/19/2015   Procedure: ESOPHAGOGASTRODUODENOSCOPY (EGD);  Surgeon: Patrick Spates, MD;  Location[facility] ENDOSCOPY;  Service: Endoscopy;  Laterality: N/A;  . right knee replacement Right 1996  . SHOULDER SURGERY    . TEE WITHOUT CARDIOVERSION N/A 03/27/2020   Procedure: TRANSESOPHAGEAL ECHOCARDIOGRAM (TEE);  Surgeon: Patrick Latch, MD;  Location: [place];  Service: Cardiovascular;  Laterality: N/A;  . TONSILLECTOMY    . TOTAL HIP ARTHROPLASTY Right 08/13/2019   Procedure: RIGHT TOTAL HIP ARTHROPLASTY ANTERIOR APPROACH;  Surgeon: Patrick Rossetti, MD;  Location: [place];  Service: Orthopedics;  Laterality: Right;    Current Outpatient Medications  Medication Sig Dispense Refill  . allopurinol (ZYLOPRIM) 100 MG tablet TAKE 1 TABLET ONCE DAILY. (Patient taking differently: Take 100 mg by mouth daily. ) 90 tablet 0  . apixaban (ELIQUIS) 5 MG TABS tablet Take 1 tablet (5 mg total) by mouth 2 (two) times daily. 60 tablet 5  . aspirin EC 81 MG tablet Take 81 mg by mouth daily.    [name] atorvastatin (LIPITOR) 10 MG tablet Take 10 mg by mouth daily.  10  . colchicine 0.6 MG tablet Take 1 tablet (0.6 mg total) by mouth 2 (two) times daily as needed. (Patient taking differently: Take 0.6 mg by mouth as needed (Gout). As needed for Gout) 30 tablet 2  . dutasteride (AVODART) 0.5 MG capsule Take 0.5 mg by mouth daily.  2  . fluticasone furoate-vilanterol (BREO ELLIPTA) 100-25 MCG/INH AEPB Inhale 1 puff into the lungs daily. 2 each 0  . furosemide (LASIX) 40 MG tablet Take 1.5 tablets (60 mg total) by mouth daily. Take 1 tablet (78m) twice daily for 3 days then 1 1/2 tablets (669m daily 135 tablet 3  .  metoprolol tartrate (LOPRESSOR) 25 MG tablet Take 0.5 tablets (12.5 mg total) by mouth 2 (two) times daily. 90 tablet 3  . mirtazapine (REMERON) 30 MG tablet TAKE ONE TABLET AT BEDTIME. 90 tablet 0  . Multiple Vitamin (MULTIVITAMIN) tablet Take 1 tablet by mouth daily.    . Omega-3 Fatty Acids (FISH OIL PO) Take 1,000 mg by mouth daily.    . Vladimir Fasterlycol-Propyl Glycol (SYSTANE) 0.4-0.3 % SOLN Place 1 drop into both eyes daily as needed (Dry eye).    . potassium chloride (KLOR-CON) 10 MEQ tablet Take 10 mEq by mouth 2 (two) times daily.     . Probiotic Product (PROBIOTIC DAILY PO) Take 1 tablet by mouth daily.    . Marland KitchenyridOXINE (VITAMIN B-6) 100 MG tablet Take 100 mg by mouth daily.    . Marland Kitchenespiratory Therapy Supplies (FLUTTER) DEVI Use the flutter valve 10 times when doing it daily. 1 each 0  . tamsulosin (FLOMAX) 0.4 MG CAPS capsule Take 0.4 mg by mouth daily.  2  . Turmeric 500 MG CAPS Take 500 mg by mouth daily.    . verapamil (CALAN-SR) 180 MG CR tablet Take 2 tablets (360 mg total) by mouth daily. 60 tablet 2  . vitamin B-12 (CYANOCOBALAMIN) 1000 MCG tablet Take 1,000 mcg by mouth daily.    . Vitamin D, Ergocalciferol, (DRISDOL) 1.25 MG (50000 UNIT) CAPS capsule TAKE 1 CAPSULE WEEKLY. 12 capsule 0   No current facility-administered medications for this encounter.    No Known Allergies  Social History   Socioeconomic History  . Marital status: Married    Spouse name: Not on file  . Number of children: 2  . Years of education: Not on file  . Highest education level: Not on file  Occupational History  . Occupation: Retired chSocial research officer, governmentTobacco Use  . Smoking status: Former Smoker    Packs/day: 0.50    Types: Cigarettes    Start date: 1960    Quit date: 1961    Years since quitting: 60.4  . Smokeless tobacco: Never Used  . 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 Alcohol use: Not on file  . Drug use: No  . Sexual activity:  Yes  Other Topics Concern  . Not on file  Social History Narrative   Patient is right handed.   Patient drinks 1 cup of coffee per day.   Social Determinants of Health   Financial Resource Strain:   . Difficulty of Paying Living Expenses:   Food Insecurity:   . Worried About RunCharity fundraiser the Last Year:   . RanArboriculturist the Last Year:   Transportation Needs:   . LacFilm/video editoredical):   . LMarland Kitchenck of Transportation (Non-Medical):   Physical Activity:   . Days  of Exercise per Week:   . Minutes of Exercise per Session:   Stress:   . Feeling of Stress :   Social Connections:   . Frequency of Communication with Friends and Family:   . Frequency of Social Gatherings with Friends and Family:   . Attends Religious Services:   . Active Member of Clubs or Organizations:   . Attends Archivist Meetings:   [name] Marital Status:   Intimate Partner Violence:   . Fear of Current or Ex-Partner:   . Emotionally Abused:   [name] Physically Abused:   . Sexually Abused:     Family History  Problem Relation Age of Onset  . Pneumonia Mother 26  . Cancer - Lung Father 55  . Multiple myeloma Brother 55    ROS- All systems are reviewed and negative except as per the HPI above  Physical Exam: Vitals:   04/20/20 1406  BP: 116/70  Pulse: 68  Weight: 96 kg  Height: _0  (1.778 m)   Wt Readings from Last 3 Encounters:  04/20/20 96 kg  04/17/20 97.9 kg  03/27/20 94.3 kg    Labs: Lab Results  Component Value Date   NA 143 03/23/2020   K 4.1 03/23/2020   CL 102 03/23/2020   CO2 31 (H) 03/23/2020   GLUCOSE 113 (H) 03/23/2020   BUN 25 03/23/2020   CREATININE 1.03 03/23/2020   CALCIUM 10.0 03/23/2020   MG 2.1 03/08/2020   No results found for: INR Lab Results  Component Value Date   CHOL 96 03/06/2020   HDL 48 03/06/2020   LDLCALC 38 03/06/2020   TRIG 50 03/06/2020     GEN- The patient is well appearing, alert and oriented x 3 today.   Head-  normocephalic, atraumatic Eyes-  Sclera clear, conjunctiva pink Ears- hearing intact Oropharynx- clear Neck- supple, no JVP Lymph- no cervical lymphadenopathy Lungs- Clear to ausculation bilaterally, normal work of breathing Heart- irregular rate and rhythm, no murmurs, rubs or gallops, PMI not laterally displaced GI- soft, NT, ND, + BS Extremities- no clubbing, cyanosis, or edema MS- no significant deformity or atrophy Skin- no rash or lesion Psych- euthymic mood, full affect Neuro- strength and sensation are intact  EKG- afib at 68 bpm, qrs int 88 ms, qt 452 ms(436 ms   on EKG in SR in 2020) Echo-1. Left ventricular ejection fraction, by estimation, is 60 to 65%. The left ventricle has normal function. The left ventricle has no regional wall motion abnormalities. 2. Prominent moderator band. Right ventricular systolic function is normal. The right ventricular size is normal. 3. Left atrial size was severely dilated. No left atrial/left atrial appendage thrombus was detected. 4. Right atrial size was mildly dilated. 5. The mitral valve is degenerative. Moderate mitral valve regurgitation. No evidence of mitral stenosis. 6. The aortic valve is tricuspid. Aortic valve regurgitation is mild. Mild aortic valve stenosis. 7. Aortic dilatation noted. There is mild dilatation of the ascending aorta measuring 38 mm. There is mild (Grade II) atheroma plaque involving the transverse aorta. 8. The inferior vena cava is normal in size with greater than 50% respiratory variability, suggesting right atrial pressure of 3 mmHg.    Assessment and Plan: 1. Afib/flutter  He  is quite symptomatic with atrial fibrillation ERAF after successful cardioversion 5/14 Patrick Franco, [place] felt underlying pulmonary disease with  history of tracheobronchomalacia and prior  PFTs demonstrating  moderately reduced diffusion capacity may make amiodarone not the optimal choice for AAD therapy.  I discussed Tikosyn  in detail He will check on the price of the drug and he will get back in touch to schedule elective admission for tikosyn start He will avoid benadryl Drugs to be screened by PharmD  2. CHA2DS2VASc score of at least 5 Continue  eliquis 5 mg bid  Reminded not to miss nay doses  3. Diastolic HF Recent increase in lasix with weight loss of around 4 lbs  Continue  lasix at 60 mg daily Avoid slat  Daily weights  I will schedule pt for tikosyn admit when they call back with drug cost info  Butch Penny C. , [facility] [address] [place], [place] [address] [phone]

## 2020-04-21 ENCOUNTER — Encounter (HOSPITAL_COMMUNITY): Payer: Self-pay | Admitting: Nurse Practitioner

## 2020-04-22 ENCOUNTER — Telehealth: Payer: Self-pay | Admitting: Pharmacist

## 2020-04-22 ENCOUNTER — Other Ambulatory Visit (HOSPITAL_COMMUNITY): Payer: Self-pay | Admitting: *Deleted

## 2020-04-22 MED ORDER — DILTIAZEM HCL ER COATED BEADS 240 MG PO CP24
240.0000 mg | ORAL_CAPSULE | Freq: Every day | ORAL | 3 refills | Status: DC
Start: 2020-04-22 — End: 2020-08-18

## 2020-04-22 NOTE — Telephone Encounter (Signed)
Patient instructed to stop verapamil and start cardizem 240mg  once a day.  Pt verbalized understanding.

## 2020-04-22 NOTE — Addendum Note (Signed)
Addended by: Juluis Mire on: 04/22/2020 04:46 PM   Modules accepted: Orders

## 2020-04-22 NOTE — Telephone Encounter (Signed)
Medication list reviewed in anticipation of upcoming Tikosyn initiation. Patient is using Breo inhaler and taking mirtazapine which are QTc prolonging but not contraindicated. He is also taking verapamil which is contraindicated with Tikosyn and will need to be discontinued at least 3 days prior to Tikosyn initiation.  Patient is anticoagulated on Eliquis 5mg  BID on the appropriate dose. Please ensure that patient has not missed any anticoagulation doses in the 3 weeks prior to Tikosyn initiation.   Patient will need to be counseled to avoid use of Benadryl while on Tikosyn and in the 2-3 days prior to Tikosyn initiation.

## 2020-04-24 ENCOUNTER — Other Ambulatory Visit: Payer: Self-pay

## 2020-04-24 ENCOUNTER — Other Ambulatory Visit: Payer: Medicare Other | Admitting: *Deleted

## 2020-04-24 DIAGNOSIS — IMO0002 Reserved for concepts with insufficient information to code with codable children: Secondary | ICD-10-CM

## 2020-04-24 DIAGNOSIS — I4892 Unspecified atrial flutter: Secondary | ICD-10-CM | POA: Diagnosis not present

## 2020-04-24 DIAGNOSIS — I5033 Acute on chronic diastolic (congestive) heart failure: Secondary | ICD-10-CM

## 2020-04-24 DIAGNOSIS — I4891 Unspecified atrial fibrillation: Secondary | ICD-10-CM | POA: Diagnosis not present

## 2020-04-24 DIAGNOSIS — I2510 Atherosclerotic heart disease of native coronary artery without angina pectoris: Secondary | ICD-10-CM | POA: Diagnosis not present

## 2020-04-24 DIAGNOSIS — I10 Essential (primary) hypertension: Secondary | ICD-10-CM | POA: Diagnosis not present

## 2020-04-25 LAB — BASIC METABOLIC PANEL
BUN/Creatinine Ratio: 28 — ABNORMAL HIGH (ref 10–24)
BUN: 29 mg/dL — ABNORMAL HIGH (ref 8–27)
CO2: 26 mmol/L (ref 20–29)
Calcium: 9.3 mg/dL (ref 8.6–10.2)
Chloride: 103 mmol/L (ref 96–106)
Creatinine, Ser: 1.04 mg/dL (ref 0.76–1.27)
GFR calc Af Amer: 75 mL/min/{1.73_m2} (ref >59)
GFR calc non Af Amer: 65 mL/min/{1.73_m2} (ref >59)
Glucose: 135 mg/dL — ABNORMAL HIGH (ref 65–99)
Potassium: 4.8 mmol/L (ref 3.5–5.2)
Sodium: 144 mmol/L (ref 134–144)

## 2020-04-27 DIAGNOSIS — G4733 Obstructive sleep apnea (adult) (pediatric): Secondary | ICD-10-CM | POA: Diagnosis not present

## 2020-05-02 ENCOUNTER — Other Ambulatory Visit (HOSPITAL_COMMUNITY)
Admission: RE | Admit: 2020-05-02 | Discharge: 2020-05-02 | Disposition: A | Discharge status: Home / Self Care | Payer: Medicare Other | Source: Ambulatory Visit | Attending: Nurse Practitioner | Admitting: Nurse Practitioner

## 2020-05-02 DIAGNOSIS — Z20822 Contact with and (suspected) exposure to covid-19: Secondary | ICD-10-CM | POA: Insufficient documentation

## 2020-05-02 DIAGNOSIS — Z01812 Encounter for preprocedural laboratory examination: Secondary | ICD-10-CM | POA: Insufficient documentation

## 2020-05-02 LAB — SARS CORONAVIRUS 2 (TAT 6-24 HRS): SARS Coronavirus 2: NEGATIVE

## 2020-05-05 ENCOUNTER — Other Ambulatory Visit: Payer: Self-pay

## 2020-05-05 ENCOUNTER — Inpatient Hospital Stay (HOSPITAL_COMMUNITY)
Admission: RE | Admit: 2020-05-05 | Discharge: 2020-05-08 | DRG: 309 | Disposition: A | Discharge status: Home / Self Care | Payer: Medicare Other | Source: Ambulatory Visit | Attending: Internal Medicine | Admitting: Internal Medicine

## 2020-05-05 ENCOUNTER — Ambulatory Visit (HOSPITAL_COMMUNITY)
Admission: RE | Admit: 2020-05-05 | Discharge: 2020-05-05 | Disposition: A | Discharge status: Home / Self Care | Payer: Medicare Other | Source: Ambulatory Visit | Attending: Nurse Practitioner | Admitting: Nurse Practitioner

## 2020-05-05 ENCOUNTER — Encounter (HOSPITAL_COMMUNITY): Payer: Self-pay | Admitting: Internal Medicine

## 2020-05-05 VITALS — BP 100/66 | HR 68 | Ht 70.0 in | Wt 207.6 lb

## 2020-05-05 DIAGNOSIS — G4730 Sleep apnea, unspecified: Secondary | ICD-10-CM | POA: Diagnosis present

## 2020-05-05 DIAGNOSIS — I493 Ventricular premature depolarization: Secondary | ICD-10-CM | POA: Diagnosis present

## 2020-05-05 DIAGNOSIS — H9190 Unspecified hearing loss, unspecified ear: Secondary | ICD-10-CM | POA: Diagnosis present

## 2020-05-05 DIAGNOSIS — Z20822 Contact with and (suspected) exposure to covid-19: Secondary | ICD-10-CM | POA: Diagnosis not present

## 2020-05-05 DIAGNOSIS — Z96641 Presence of right artificial hip joint: Secondary | ICD-10-CM | POA: Diagnosis present

## 2020-05-05 DIAGNOSIS — I110 Hypertensive heart disease with heart failure: Secondary | ICD-10-CM | POA: Diagnosis not present

## 2020-05-05 DIAGNOSIS — N400 Enlarged prostate without lower urinary tract symptoms: Secondary | ICD-10-CM | POA: Diagnosis not present

## 2020-05-05 DIAGNOSIS — Z8711 Personal history of peptic ulcer disease: Secondary | ICD-10-CM | POA: Diagnosis not present

## 2020-05-05 DIAGNOSIS — Z801 Family history of malignant neoplasm of trachea, bronchus and lung: Secondary | ICD-10-CM | POA: Diagnosis not present

## 2020-05-05 DIAGNOSIS — M5416 Radiculopathy, lumbar region: Secondary | ICD-10-CM | POA: Diagnosis not present

## 2020-05-05 DIAGNOSIS — I5032 Chronic diastolic (congestive) heart failure: Secondary | ICD-10-CM

## 2020-05-05 DIAGNOSIS — Z974 Presence of external hearing-aid: Secondary | ICD-10-CM

## 2020-05-05 DIAGNOSIS — I480 Paroxysmal atrial fibrillation: Secondary | ICD-10-CM | POA: Diagnosis present

## 2020-05-05 DIAGNOSIS — Z87891 Personal history of nicotine dependence: Secondary | ICD-10-CM

## 2020-05-05 DIAGNOSIS — R0602 Shortness of breath: Secondary | ICD-10-CM | POA: Diagnosis present

## 2020-05-05 DIAGNOSIS — I4819 Other persistent atrial fibrillation: Principal | ICD-10-CM | POA: Diagnosis present

## 2020-05-05 DIAGNOSIS — I4891 Unspecified atrial fibrillation: Secondary | ICD-10-CM | POA: Diagnosis present

## 2020-05-05 DIAGNOSIS — G8929 Other chronic pain: Secondary | ICD-10-CM | POA: Diagnosis not present

## 2020-05-05 DIAGNOSIS — Z807 Family history of other malignant neoplasms of lymphoid, hematopoietic and related tissues: Secondary | ICD-10-CM

## 2020-05-05 DIAGNOSIS — D6869 Other thrombophilia: Secondary | ICD-10-CM

## 2020-05-05 DIAGNOSIS — E876 Hypokalemia: Secondary | ICD-10-CM | POA: Diagnosis not present

## 2020-05-05 DIAGNOSIS — IMO0002 Reserved for concepts with insufficient information to code with codable children: Secondary | ICD-10-CM

## 2020-05-05 DIAGNOSIS — I4892 Unspecified atrial flutter: Secondary | ICD-10-CM | POA: Diagnosis present

## 2020-05-05 DIAGNOSIS — I10 Essential (primary) hypertension: Secondary | ICD-10-CM | POA: Diagnosis not present

## 2020-05-05 HISTORY — DX: Unspecified atrial fibrillation: I48.91

## 2020-05-05 LAB — BASIC METABOLIC PANEL
Anion gap: 11 (ref 5–15)
BUN: 33 mg/dL — ABNORMAL HIGH (ref 8–23)
CO2: 26 mmol/L (ref 22–32)
Calcium: 9.6 mg/dL (ref 8.9–10.3)
Chloride: 105 mmol/L (ref 98–111)
Creatinine, Ser: 1.19 mg/dL (ref 0.61–1.24)
GFR calc Af Amer: >60 mL/min (ref >60)
GFR calc non Af Amer: 55 mL/min — ABNORMAL LOW (ref >60)
Glucose, Bld: 123 mg/dL — ABNORMAL HIGH (ref 70–99)
Potassium: 3.9 mmol/L (ref 3.5–5.1)
Sodium: 142 mmol/L (ref 135–145)

## 2020-05-05 LAB — MAGNESIUM: Magnesium: 2 mg/dL (ref 1.7–2.4)

## 2020-05-05 MED ORDER — MIRTAZAPINE 30 MG PO TABS
30.0000 mg | ORAL_TABLET | Freq: Every day | ORAL | Status: DC
  Administered 2020-05-07: 30 mg via ORAL
  Filled 2020-05-05 (×4): qty 1

## 2020-05-05 MED ORDER — DUTASTERIDE 0.5 MG PO CAPS
0.5000 mg | ORAL_CAPSULE | Freq: Every day | ORAL | Status: DC
  Administered 2020-05-06 – 2020-05-08 (×3): 0.5 mg via ORAL
  Filled 2020-05-05 (×3): qty 1

## 2020-05-05 MED ORDER — DOFETILIDE 500 MCG PO CAPS
500.0000 ug | ORAL_CAPSULE | Freq: Two times a day (BID) | ORAL | Status: DC
  Administered 2020-05-05 – 2020-05-08 (×6): 500 ug via ORAL
  Filled 2020-05-05 (×6): qty 1

## 2020-05-05 MED ORDER — MAGNESIUM SULFATE 2 GM/50ML IV SOLN
2.0000 g | Freq: Once | INTRAVENOUS | Status: AC
  Administered 2020-05-05: 2 g via INTRAVENOUS
  Filled 2020-05-05: qty 50

## 2020-05-05 MED ORDER — TAMSULOSIN HCL 0.4 MG PO CAPS
0.4000 mg | ORAL_CAPSULE | Freq: Every day | ORAL | Status: DC
  Administered 2020-05-06 – 2020-05-08 (×3): 0.4 mg via ORAL
  Filled 2020-05-05 (×3): qty 1

## 2020-05-05 MED ORDER — DILTIAZEM HCL ER COATED BEADS 240 MG PO CP24
240.0000 mg | ORAL_CAPSULE | Freq: Every day | ORAL | Status: DC
  Administered 2020-05-06 – 2020-05-08 (×3): 240 mg via ORAL
  Filled 2020-05-05 (×3): qty 1

## 2020-05-05 MED ORDER — SODIUM CHLORIDE 0.9% FLUSH: 3.0000 mL | INTRAVENOUS | Status: DC | PRN

## 2020-05-05 MED ORDER — POTASSIUM CHLORIDE ER 10 MEQ PO TBCR
10.0000 meq | EXTENDED_RELEASE_TABLET | Freq: Two times a day (BID) | ORAL | Status: DC
  Administered 2020-05-05: 10 meq via ORAL
  Filled 2020-05-05 (×3): qty 1

## 2020-05-05 MED ORDER — POLYVINYL ALCOHOL 1.4 % OP SOLN
1.0000 [drp] | OPHTHALMIC | Status: DC | PRN
  Filled 2020-05-05: qty 15

## 2020-05-05 MED ORDER — APIXABAN 5 MG PO TABS
5.0000 mg | ORAL_TABLET | Freq: Two times a day (BID) | ORAL | Status: DC
  Administered 2020-05-05 – 2020-05-08 (×6): 5 mg via ORAL
  Filled 2020-05-05 (×6): qty 1

## 2020-05-05 MED ORDER — POTASSIUM CHLORIDE CRYS ER 20 MEQ PO TBCR
40.0000 meq | EXTENDED_RELEASE_TABLET | Freq: Once | ORAL | Status: AC
  Administered 2020-05-05: 40 meq via ORAL
  Filled 2020-05-05: qty 2

## 2020-05-05 MED ORDER — SODIUM CHLORIDE 0.9% FLUSH
3.0000 mL | Freq: Two times a day (BID) | INTRAVENOUS | Status: DC
  Administered 2020-05-06 – 2020-05-08 (×4): 3 mL via INTRAVENOUS

## 2020-05-05 MED ORDER — METOPROLOL TARTRATE 12.5 MG HALF TABLET
12.5000 mg | ORAL_TABLET | Freq: Two times a day (BID) | ORAL | Status: DC
  Administered 2020-05-05 – 2020-05-08 (×6): 12.5 mg via ORAL
  Filled 2020-05-05 (×6): qty 1

## 2020-05-05 MED ORDER — FUROSEMIDE 40 MG PO TABS
60.0000 mg | ORAL_TABLET | Freq: Every day | ORAL | Status: DC
  Administered 2020-05-06 – 2020-05-08 (×3): 60 mg via ORAL
  Filled 2020-05-05 (×3): qty 1

## 2020-05-05 MED ORDER — ALLOPURINOL 100 MG PO TABS
100.0000 mg | ORAL_TABLET | Freq: Every day | ORAL | Status: DC
  Administered 2020-05-06 – 2020-05-08 (×3): 100 mg via ORAL
  Filled 2020-05-05 (×3): qty 1

## 2020-05-05 MED ORDER — ASPIRIN EC 81 MG PO TBEC
81.0000 mg | DELAYED_RELEASE_TABLET | Freq: Every day | ORAL | Status: DC
  Administered 2020-05-06 – 2020-05-08 (×3): 81 mg via ORAL
  Filled 2020-05-05 (×3): qty 1

## 2020-05-05 MED ORDER — SODIUM CHLORIDE 0.9 % IV SOLN: 250.0000 mL | INTRAVENOUS | Status: DC | PRN

## 2020-05-05 MED ORDER — POLYETHYL GLYCOL-PROPYL GLYCOL 0.4-0.3 % OP SOLN: 1.0000 [drp] | Freq: Three times a day (TID) | OPHTHALMIC | Status: DC

## 2020-05-05 MED ORDER — ATORVASTATIN CALCIUM 10 MG PO TABS
10.0000 mg | ORAL_TABLET | Freq: Every day | ORAL | Status: DC
  Administered 2020-05-06 – 2020-05-08 (×3): 10 mg via ORAL
  Filled 2020-05-05 (×3): qty 1

## 2020-05-05 NOTE — H&P (Signed)
Primary Care Physician: Lavone Orn, MD Referring Physician: Richardson Dopp, PA Cardiologist: Dr. Salli Quarry Patrick Franco is a 86 y.o. male with a h/o CAD, diastolic HF, aortic stenosis,MVP, HTN,  seen by Dr. Burt Knack on 03/05/2020. He reported feeling terrible. He had gained 10-15 lbs over the past two weeks. He had dyspnea with minimal exertion. He had lower extremity edema and abdominal bloating. He denied  chest pain, dizziness, near syncope, syncope, or awareness of his heart racing.    EKG in [facility] office showed atrial flutter with RVR.Patient was directly admitted for IV diuresis and management of atrial flutter.  he had successful TEE/DCCV. He saw Richardson Dopp PA on f/u. He still had shortness of breath and fatigue. He was found to be back in afib. He suggested AAD therapy.  He felt that his pulmonary issues may make amiodarone not a good option. He suggested Tikosyn  and asked him to f/u here for further discussion.   Patrick Franco also increased his lasix for several days. Weight is down a few lbs but still has fatigue and shortness of breath. EKg today shows afib at 68 bpm.  Pt is in afib clinic for Tikosyn admit, 6/22. Patrick Franco He continues to have no energy in afib, he is short of breath on exertion.  He  has not missed any DOAC doses, no benadryl use.  Qtc in afib acceptable at 435. Pharm D screened drugs and verapamil  was changed to Cardizem several weeks ago. Mirtazapine is not contraindicated, but can be qt prolonging. He is aware of the cost of the drug.   Today, he denies symptoms of palpitations, chest pain, shortness of breath, orthopnea, PND, lower extremity edema, dizziness, presyncope, syncope, or neurologic sequela. The patient is tolerating medications without difficulties and is otherwise without complaint today.   Past Medical History:  Diagnosis Date  . Aortic stenosis   . Arthritis   . Atrial fibrillation ([place])   . BPH (benign prostatic hyperplasia)   . Chronic low back pain   .  Depression   . Diverticulitis   . Elevated PSA   . Foot drop, right   . Gait disorder 02/03/2015  . Hip pain   . History of colonoscopy   . HOH (hard of hearing)    hearing aid  . Hypertension   . Lumbar radiculopathy   . Mitral valve prolapse   . Onychomycosis   . Peptic ulcer disease   . Sepsis ([place]) 2005   e. coli-after prostate bx  . Sleep apnea   . Torn ACL   . Tracheobronchomalacia    seen on 2019 Chest CT   Past Surgical History:  Procedure Laterality Date  . CARDIOVERSION N/A 03/27/2020   Procedure: CARDIOVERSION;  Surgeon: Skeet Latch, MD;  Location: [place];  Service: Cardiovascular;  Laterality: N/A;  . CATARACT EXTRACTION Bilateral   . ESOPHAGOGASTRODUODENOSCOPY N/A 08/19/2015   Procedure: ESOPHAGOGASTRODUODENOSCOPY (EGD);  Surgeon: Laurence Spates, MD;  Location[facility] ENDOSCOPY;  Service: Endoscopy;  Laterality: N/A;  . right knee replacement Right 1996  . SHOULDER SURGERY    . TEE WITHOUT CARDIOVERSION N/A 03/27/2020   Procedure: TRANSESOPHAGEAL ECHOCARDIOGRAM (TEE);  Surgeon: Skeet Latch, MD;  Location: [place];  Service: Cardiovascular;  Laterality: N/A;  . TONSILLECTOMY    . TOTAL HIP ARTHROPLASTY Right 08/13/2019   Procedure: RIGHT TOTAL HIP ARTHROPLASTY ANTERIOR APPROACH;  Surgeon: Mcarthur Rossetti, MD;  Location: [place];  Service: Orthopedics;  Laterality: Right;    Current Facility-Administered Medications  Medication Dose Route Frequency Provider Last Rate Last Admin  . 0.9 %  sodium chloride infusion  250 mL Intravenous PRN Sherran Needs, NP      . Derrill Memo ON 05/06/2020] allopurinol (ZYLOPRIM) tablet 100 mg  100 mg Oral Daily Sherran Needs, NP      . apixaban Arne Cleveland) tablet 5 mg  5 mg Oral BID Sherran Needs, NP      . Derrill Memo ON 05/06/2020] aspirin EC tablet 81 mg  81 mg Oral Daily Sherran Needs, NP      . Derrill Memo ON 05/06/2020] atorvastatin (LIPITOR) tablet 10 mg  10 mg Oral Daily Sherran Needs, NP      . Derrill Memo ON  05/06/2020] diltiazem (CARDIZEM CD) 24 hr capsule 240 mg  240 mg Oral Daily Sherran Needs, NP      . dofetilide (TIKOSYN) capsule 500 mcg  500 mcg Oral BID Sherran Needs, NP      . Derrill Memo ON 05/06/2020] dutasteride (AVODART) capsule 0.5 mg  0.5 mg Oral Daily Sherran Needs, NP      . Derrill Memo ON 05/06/2020] furosemide (LASIX) tablet 60 mg  60 mg Oral Daily Sherran Needs, NP      . metoprolol tartrate (LOPRESSOR) tablet 12.5 mg  12.5 mg Oral BID Sherran Needs, NP      . mirtazapine (REMERON) tablet 30 mg  30 mg Oral QHS Sherran Needs, NP      . polyvinyl alcohol (LIQUIFILM TEARS) 1.4 % ophthalmic solution 1 drop  1 drop Both Eyes PRN , , MD      . potassium chloride (KLOR-CON) CR tablet 10 mEq  10 mEq Oral BID Sherran Needs, NP      . sodium chloride flush (NS) 0.9 % injection 3 mL  3 mL Intravenous Q12H Roderic Palau C, NP      . sodium chloride flush (NS) 0.9 % injection 3 mL  3 mL Intravenous PRN Sherran Needs, NP      . Derrill Memo ON 05/06/2020] tamsulosin (FLOMAX) capsule 0.4 mg  0.4 mg Oral Daily Sherran Needs, NP        No Known Allergies  Social History   Socioeconomic History  . Marital status: Married    Spouse name: Not on file  . Number of children: 2  . Years of education: Not on file  . Highest education level: Not on file  Occupational History  . Occupation: Retired Social research officer, government  Tobacco Use  . Smoking status: Former Smoker    Packs/day: 0.50    Types: Cigarettes    Start date: 1960    Quit date: 1961    Years since quitting: 60.5  . Smokeless tobacco: Never Used  . 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 Vaping Use: Never used  Substance and Sexual Activity  . Alcohol use: Not on file  . Drug use: No  . Sexual activity: Yes  Other Topics Concern  . Not on file  Social History Narrative   Patient is right handed.   Patient drinks 1 cup of coffee per day.   Social Determinants of Health    Financial Resource Strain:   . Difficulty of Paying Living Expenses:   Food Insecurity:   . Worried About RCharity fundraiserin the Last Year:   . RMatthewsin the Last Year:   Transportation Needs:   . Lack of  Transportation (Medical):   Patrick Franco Lack of Transportation (Non-Medical):   Physical Activity:   . Days of Exercise per Week:   . Minutes of Exercise per Session:   Stress:   . Feeling of Stress :   Social Connections:   . Frequency of Communication with Friends and Family:   . Frequency of Social Gatherings with Friends and Family:   . Attends Religious Services:   . Active Member of Clubs or Organizations:   . Attends Archivist Meetings:   Patrick Franco Marital Status:   Intimate Partner Violence:   . Fear of Current or Ex-Partner:   . Emotionally Abused:   Patrick Franco Physically Abused:   . Sexually Abused:     Family History  Problem Relation Age of Onset  . Pneumonia Mother 70  . Cancer - Lung Father 3  . Multiple myeloma Brother 55    ROS- All systems are reviewed and negative except as per the HPI above  Physical Exam: Vitals:   05/05/20 1152 05/05/20 1155  BP:  116/70  Pulse:  65  Resp:  16  Temp:  98.2 F (36.8 C)  TempSrc:  Oral  SpO2:  95%  Weight: 93.7 kg   Height: 5' 10"  (1.778 m)    Wt Readings from Last 3 Encounters:  05/05/20 93.7 kg  05/05/20 94.2 kg  04/20/20 96 kg    Labs: Lab Results  Component Value Date   NA 142 05/05/2020   K 3.9 05/05/2020   CL 105 05/05/2020   CO2 26 05/05/2020   GLUCOSE 123 (H) 05/05/2020   BUN 33 (H) 05/05/2020   CREATININE 1.19 05/05/2020   CALCIUM 9.6 05/05/2020   MG 2.0 05/05/2020   No results found for: INR Lab Results  Component Value Date   CHOL 96 03/06/2020   HDL 48 03/06/2020   LDLCALC 38 03/06/2020   TRIG 50 03/06/2020     GEN- The patient is well appearing, alert and oriented x 3 today.   Head- normocephalic, atraumatic Eyes-  Sclera clear, conjunctiva pink Ears- hearing  intact Oropharynx- clear Neck- supple, no JVP Lymph- no cervical lymphadenopathy Lungs- Clear to ausculation bilaterally, normal work of breathing Heart- irregular rate and rhythm, no murmurs, rubs or gallops, PMI not laterally displaced GI- soft, NT, ND, + BS Extremities- no clubbing, cyanosis, or edema MS- no significant deformity or atrophy Skin- no rash or lesion Psych- euthymic mood, full affect Neuro- strength and sensation are intact  EKG- afib at 68 bpm, qrs int 86 ms, qt 435 ms(436 ms  on EKG in [facility] in 2020)  Echo-1. Left ventricular ejection fraction, by estimation, is 60 to 65%. The left ventricle has normal function. The left ventricle has no regional wall motion abnormalities. 2. Prominent moderator band. Right ventricular systolic function is normal. The right ventricular size is normal. 3. Left atrial size was severely dilated. No left atrial/left atrial appendage thrombus was detected. 4. Right atrial size was mildly dilated. 5. The mitral valve is degenerative. Moderate mitral valve regurgitation. No evidence of mitral stenosis. 6. The aortic valve is tricuspid. Aortic valve regurgitation is mild. Mild aortic valve stenosis. 7. Aortic dilatation noted. There is mild dilatation of the ascending aorta measuring 38 mm. There is mild (Grade II) atheroma plaque involving the transverse aorta. 8. The inferior vena cava is normal in size with greater than 50% respiratory variability, suggesting right atrial pressure of 3 mmHg.    Assessment and Plan: 1. Afib/flutter  He  is quite  symptomatic with atrial fibrillation ERAF after successful cardioversion 5/14 Richardson Dopp, [place] felt underlying pulmonary disease with  history of tracheobronchomalacia and prior  PFTs demonstrating  moderately reduced diffusion capacity may make amiodarone not the optimal choice for AAD therapy.  I discussed Tikosyn in detail He is here now for admit for tikosyn  Aware of price of drug No   benadryl Drugs  screened by PharmD with change form verapamil many days ago to Cardizem  Bmey/mag  covid negative   2. CHA2DS2VASc score of at least 5 Continue  eliquis 5 mg bid  No  missed   doses  3. Diastolic HF Continue  lasix at 60 mg daily Avoid salt  Daily weights  To  [place]. [place], [facility] [address] [place], [place] [address] [phone]   I have seen, examined the patient, and reviewed the above assessment and plan.  Changes to above are made where necessary.  On exam, iRRR.  He has diastolic dysfunction as well as ongoing afib.  We will admit for initiation of tikosyn at this time.  Co Sign: Thompson Grayer, MD 05/05/2020 3:53 PM

## 2020-05-05 NOTE — TOC Benefit Eligibility Note (Signed)
Transition of Care [facility]) Benefit Eligibility Note    Patient Details  Name: Patrick Franco MRN: [id number] Date of Birth: February 24, 1932   Medication/Dose: dofetilide  Covered?: Yes  Prescription Coverage Preferred Pharmacy: [facility] with Person/Company/Phone Number:: [employer]  Co-Pay: $3 for 30 day retail  Prior Approval: No   Delorse Lek Phone Number: 05/05/2020, 2:01 PM

## 2020-05-05 NOTE — Progress Notes (Addendum)
Pharmacy: Dofetilide (Tikosyn) - Initial Consult Assessment and Electrolyte Replacement  Pharmacy consulted to assist in monitoring and replacing electrolytes in this 86 y.o. male admitted on 05/05/2020 undergoing dofetilide initiation. First dofetilide dose: 6/22@2000 .  Assessment:  Patient Exclusion Criteria: If any screening criteria checked as "Yes", then  patient  should NOT receive dofetilide until criteria item is corrected.  If "Yes" please indicate correction plan.  YES  NO Patient  Exclusion Criteria Correction Plan   []   [x]   Baseline QTc interval is greater than or equal to 440 msec. IF above YES box checked dofetilide contraindicated unless patient has ICD; then may proceed if QTc 500-550 msec or with known ventricular conduction abnormalities may proceed with QTc 550-600 msec. QTc = 435 in AFib    []   [x]   Patient is known or suspected to have a digoxin level greater than 2 ng/ml: No results found for: DIGOXIN     []   [x]   Creatinine clearance less than 20 ml/min (calculated using Cockcroft-Gault, actual body weight and serum creatinine): Estimated Creatinine Clearance: 52.2 mL/min (by C-G formula based on SCr of 1.19 mg/dL).     []   [x]  Patient has received drugs known to prolong the QT intervals within the last 48 hours (phenothiazines, tricyclics or tetracyclic antidepressants, erythromycin, H-1 antihistamines, cisapride, fluoroquinolones, azithromycin). Updated information on QT prolonging agents is available to be searched on the following database:QT prolonging agents  Mirtazapine has the potential to prolong QTc (not contraindicated)- monitor closely and defer to prescriber if want to adjust in future    []   [x]   Patient received a dose of hydrochlorothiazide (Oretic) alone or in any combination including triamterene (Dyazide, Maxzide) in the last 48 hours.    []   [x]  Patient received a medication known to increase dofetilide plasma concentrations prior to  initial dofetilide dose:  . Trimethoprim (Primsol, Proloprim) in the last 36 hours . Verapamil (Calan, Verelan) in the last 36 hours or a sustained release dose in the last 72 hours . Megestrol (Megace) in the last 5 days  . Cimetidine (Tagamet) in the last 6 hours . Ketoconazole (Nizoral) in the last 24 hours . Itraconazole (Sporanox) in the last 48 hours  . Prochlorperazine (Compazine) in the last 36 hours     []   [x]   Patient is known to have a history of torsades de pointes; congenital or acquired long QT syndromes.    []   [x]   Patient has received a Class 1 antiarrhythmic with less than 2 half-lives since last dose. (Disopyramide, Quinidine, Procainamide, Lidocaine, Mexiletine, Flecainide, Propafenone)    []   [x]   Patient has received amiodarone therapy in the past 3 months or amiodarone level is greater than 0.3 ng/ml.    Patient has been appropriately anticoagulated with apixaban.  Labs:    Component Value Date/Time   K 3.9 05/05/2020 1030   MG 2.0 05/05/2020 1030     Plan: Potassium: K 3.8-3.9:  Hold Tikosyn initiation and give KCl 40 mEq po x1 then begin Tikosyn at least 2hr after KCl dose - do not need to recheck K   Magnesium: Mg 1.8-2: Give Mg 2 gm IV x1 to prevent Mg from dropping below 1.8 - do not need to recheck Mg. Appropriate to initiate Tikosyn   Thank you for allowing pharmacy to participate in this patient's care   Antonietta Jewel, PharmD, [place] Pharmacist  Phone: [phone] 05/05/2020 12:27 PM  Please check AMION for all [place] phone numbers After 10:00 PM, call  [place] [phone]

## 2020-05-05 NOTE — Progress Notes (Signed)
Primary Care Physician: Lavone Orn, MD Referring Physician: Richardson Dopp, PA Cardiologist: Dr. Salli Quarry JAJA SWITALSKI is a 86 y.o. male with a h/o CAD, diastolic HF, aortic stenosis,MVP, HTN,  seen by Dr. Burt Knack on 03/05/2020. He reported feeling terrible. He had gained 10-15 lbs over the past two weeks. He had dyspnea with minimal exertion. He had lower extremity edema and abdominal bloating. He denied  chest pain, dizziness, near syncope, syncope, or awareness of his heart racing.    EKG in [facility] office showed atrial flutter with RVR.Patient was directly admitted for IV diuresis and management of atrial flutter.  he had successful TEE/DCCV. He saw Richardson Dopp PA on f/u. He still had shortness of breath and fatigue. He was found to be back in afib. He suggested AAD therapy.  He felt that his pulmonary issues may make amiodarone not a good option. He suggested Tikosyn  and asked him to f/u here for further discussion.   [name] also increased his lasix for several days. Weight is down a few lbs but still has fatigue and shortness of breath. EKg today shows afib at 68 bpm.  Pt is in afib clinic for Tikosyn admit, 6/22. [name] He continues to have no energy in afib, he is short of breath on exertion.  He  has not missed any DOAC doses, no benadryl use.  Qtc in afib acceptable at 435. Pharm D screened drugs and verapamil  was changed to Cardizem several weeks ago. Mirtazapine is not contraindicated, but can be qt prolonging. He is aware of the cost of the drug.   Today, he denies symptoms of palpitations, chest pain, shortness of breath, orthopnea, PND, lower extremity edema, dizziness, presyncope, syncope, or neurologic sequela. The patient is tolerating medications without difficulties and is otherwise without complaint today.   Past Medical History:  Diagnosis Date  . Aortic stenosis   . Arthritis   . BPH (benign prostatic hyperplasia)   . Chronic low back pain   . Depression   .  Diverticulitis   . Elevated PSA   . Foot drop, right   . Gait disorder 02/03/2015  . Hip pain   . History of colonoscopy   . HOH (hard of hearing)    hearing aid  . Hypertension   . Lumbar radiculopathy   . Mitral valve prolapse   . Onychomycosis   . Peptic ulcer disease   . Sepsis ([place]) 2005   e. coli-after prostate bx  . Sleep apnea   . Torn ACL   . Tracheobronchomalacia    seen on 2019 Chest CT   Past Surgical History:  Procedure Laterality Date  . CARDIOVERSION N/A 03/27/2020   Procedure: CARDIOVERSION;  Surgeon: Skeet Latch, MD;  Location: [place] [facility];  Service: Cardiovascular;  Laterality: N/A;  . CATARACT EXTRACTION Bilateral   . ESOPHAGOGASTRODUODENOSCOPY N/A 08/19/2015   Procedure: ESOPHAGOGASTRODUODENOSCOPY (EGD);  Surgeon: Laurence Spates, MD;  Location[facility];  Service: Endoscopy;  Laterality: N/A;  . right knee replacement Right 1996  . SHOULDER SURGERY    . TEE WITHOUT CARDIOVERSION N/A 03/27/2020   Procedure: TRANSESOPHAGEAL ECHOCARDIOGRAM (TEE);  Surgeon: Skeet Latch, MD;  Location: [place];  Service: Cardiovascular;  Laterality: N/A;  . TONSILLECTOMY    . TOTAL HIP ARTHROPLASTY Right 08/13/2019   Procedure: RIGHT TOTAL HIP ARTHROPLASTY ANTERIOR APPROACH;  Surgeon: Mcarthur Rossetti, MD;  Location: [place];  Service: Orthopedics;  Laterality: Right;    No current facility-administered medications for this encounter.  No current outpatient medications on file.   Facility-Administered Medications Ordered in Other Encounters  Medication Dose Route Frequency Provider Last Rate Last Admin  . 0.9 %  sodium chloride infusion  250 mL Intravenous PRN Sherran Needs, NP      . Derrill Memo ON 05/06/2020] allopurinol (ZYLOPRIM) tablet 100 mg  100 mg Oral Daily Sherran Needs, NP      . apixaban (ELIQUIS) tablet 5 mg  5 mg Oral BID Sherran Needs, NP      . aspirin EC tablet 81 mg  81 mg Oral Daily Sherran Needs, NP      . Derrill Memo ON  05/06/2020] atorvastatin (LIPITOR) tablet 10 mg  10 mg Oral Daily Sherran Needs, NP      . Derrill Memo ON 05/06/2020] diltiazem (CARDIZEM CD) 24 hr capsule 240 mg  240 mg Oral Daily Sherran Needs, NP      . dofetilide (TIKOSYN) capsule 500 mcg  500 mcg Oral BID Sherran Needs, NP      . Derrill Memo ON 05/06/2020] dutasteride (AVODART) capsule 0.5 mg  0.5 mg Oral Daily Sherran Needs, NP      . Derrill Memo ON 05/06/2020] furosemide (LASIX) tablet 60 mg  60 mg Oral Daily Roderic Palau C, NP      . metoprolol tartrate (LOPRESSOR) tablet 12.5 mg  12.5 mg Oral BID Sherran Needs, NP      . mirtazapine (REMERON) tablet 30 mg  30 mg Oral QHS Sherran Needs, NP      . Polyethyl Glycol-Propyl Glycol 0.4-0.3 % SOLN 1 drop  1 drop Both Eyes TID Sherran Needs, NP      . potassium chloride (KLOR-CON) CR tablet 10 mEq  10 mEq Oral BID Sherran Needs, NP      . sodium chloride flush (NS) 0.9 % injection 3 mL  3 mL Intravenous Q12H Sherran Needs, NP      . sodium chloride flush (NS) 0.9 % injection 3 mL  3 mL Intravenous PRN Sherran Needs, NP      . Derrill Memo ON 05/06/2020] tamsulosin (FLOMAX) capsule 0.4 mg  0.4 mg Oral Daily Sherran Needs, NP        No Known Allergies  Social History   Socioeconomic History  . Marital status: Married    Spouse name: Not on file  . Number of children: 2  . Years of education: Not on file  . Highest education level: Not on file  Occupational History  . Occupation: Retired Social research officer, government  Tobacco Use  . Smoking status: Former Smoker    Packs/day: 0.50    Types: Cigarettes    Start date: 1960    Quit date: 1961    Years since quitting: 60.5  . Smokeless tobacco: Never Used  . 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 Vaping Use: Never used  Substance and Sexual Activity  . Alcohol use: Not on file  . Drug use: No  . Sexual activity: Yes  Other Topics Concern  . Not on file  Social History Narrative   Patient is right  handed.   Patient drinks 1 cup of coffee per day.   Social Determinants of Health   Financial Resource Strain:   . Difficulty of Paying Living Expenses:   Food Insecurity:   . Worried About RCharity fundraiserin the Last Year:   . RTheodorein the  Last Year:   Transportation Needs:   . Film/video editor (Medical):   [name] Lack of Transportation (Non-Medical):   Physical Activity:   . Days of Exercise per Week:   . Minutes of Exercise per Session:   Stress:   . Feeling of Stress :   Social Connections:   . Frequency of Communication with Friends and Family:   . Frequency of Social Gatherings with Friends and Family:   . Attends Religious Services:   . Active Member of Clubs or Organizations:   . Attends Archivist Meetings:   [name] Marital Status:   Intimate Partner Violence:   . Fear of Current or Ex-Partner:   . Emotionally Abused:   [name] Physically Abused:   . Sexually Abused:     Family History  Problem Relation Age of Onset  . Pneumonia Mother 52  . Cancer - Lung Father 48  . Multiple myeloma Brother 55    ROS- All systems are reviewed and negative except as per the HPI above  Physical Exam: Vitals:   05/05/20 1043  BP: 100/66  Pulse: 68  Weight: 94.2 kg  Height: _0  (1.778 m)   Wt Readings from Last 3 Encounters:  05/05/20 94.2 kg  04/20/20 96 kg  04/17/20 97.9 kg    Labs: Lab Results  Component Value Date   NA 144 04/24/2020   K 4.8 04/24/2020   CL 103 04/24/2020   CO2 26 04/24/2020   GLUCOSE 135 (H) 04/24/2020   BUN 29 (H) 04/24/2020   CREATININE 1.04 04/24/2020   CALCIUM 9.3 04/24/2020   MG 2.1 03/08/2020   No results found for: INR Lab Results  Component Value Date   CHOL 96 03/06/2020   HDL 48 03/06/2020   LDLCALC 38 03/06/2020   TRIG 50 03/06/2020     GEN- The patient is well appearing, alert and oriented x 3 today.   Head- normocephalic, atraumatic Eyes-  Sclera clear, conjunctiva pink Ears- hearing  intact Oropharynx- clear Neck- supple, no JVP Lymph- no cervical lymphadenopathy Lungs- Clear to ausculation bilaterally, normal work of breathing Heart- irregular rate and rhythm, no murmurs, rubs or gallops, PMI not laterally displaced GI- soft, NT, ND, + BS Extremities- no clubbing, cyanosis, or edema MS- no significant deformity or atrophy Skin- no rash or lesion Psych- euthymic mood, full affect Neuro- strength and sensation are intact  EKG- afib at 68 bpm, qrs int 86 ms, qt 435 ms(436 ms  on EKG in SR in 2020)  Echo-1. Left ventricular ejection fraction, by estimation, is 60 to 65%. The left ventricle has normal function. The left ventricle has no regional wall motion abnormalities. 2. Prominent moderator band. Right ventricular systolic function is normal. The right ventricular size is normal. 3. Left atrial size was severely dilated. No left atrial/left atrial appendage thrombus was detected. 4. Right atrial size was mildly dilated. 5. The mitral valve is degenerative. Moderate mitral valve regurgitation. No evidence of mitral stenosis. 6. The aortic valve is tricuspid. Aortic valve regurgitation is mild. Mild aortic valve stenosis. 7. Aortic dilatation noted. There is mild dilatation of the ascending aorta measuring 38 mm. There is mild (Grade II) atheroma plaque involving the transverse aorta. 8. The inferior vena cava is normal in size with greater than 50% respiratory variability, suggesting right atrial pressure of 3 mmHg.    Assessment and Plan: 1. Afib/flutter  He  is quite symptomatic with atrial fibrillation ERAF after successful cardioversion 5/14 Richardson Dopp, [place] felt underlying  pulmonary disease with  history of tracheobronchomalacia and prior  PFTs demonstrating  moderately reduced diffusion capacity may make amiodarone not the optimal choice for AAD therapy.  I discussed Tikosyn in detail He is here now for admit for tikosyn  Aware of price of drug No   benadryl Drugs  screened by PharmD with change form verapamil many days ago to Cardizem  Bmey/mag  covid negative   2. CHA2DS2VASc score of at least 5 Continue  eliquis 5 mg bid  No  missed   doses  3. Diastolic HF Continue  lasix at 60 mg daily Avoid salt  Daily weights  To  [place]. , [facility] [address] [place], [place] [address] [phone]

## 2020-05-05 NOTE — Care Management (Signed)
05-05-20 1306 Case Manager received referral for Tikosyn cost. Benefits check submitted and Case Manager will follow for cost and discuss pharmacy of choice with patient. Graves-Bigelow, Ocie Cornfield, RN, BSN Case Manager

## 2020-05-06 DIAGNOSIS — I10 Essential (primary) hypertension: Secondary | ICD-10-CM

## 2020-05-06 LAB — BASIC METABOLIC PANEL
Anion gap: 11 (ref 5–15)
BUN: 32 mg/dL — ABNORMAL HIGH (ref 8–23)
CO2: 25 mmol/L (ref 22–32)
Calcium: 9.1 mg/dL (ref 8.9–10.3)
Chloride: 107 mmol/L (ref 98–111)
Creatinine, Ser: 1.03 mg/dL (ref 0.61–1.24)
GFR calc Af Amer: >60 mL/min (ref >60)
GFR calc non Af Amer: >60 mL/min (ref >60)
Glucose, Bld: 103 mg/dL — ABNORMAL HIGH (ref 70–99)
Potassium: 3.8 mmol/L (ref 3.5–5.1)
Sodium: 143 mmol/L (ref 135–145)

## 2020-05-06 LAB — MAGNESIUM: Magnesium: 2.2 mg/dL (ref 1.7–2.4)

## 2020-05-06 MED ORDER — POTASSIUM CHLORIDE CRYS ER 10 MEQ PO TBCR
20.0000 meq | EXTENDED_RELEASE_TABLET | Freq: Two times a day (BID) | ORAL | Status: DC
  Administered 2020-05-06 – 2020-05-08 (×5): 20 meq via ORAL
  Filled 2020-05-06 (×8): qty 2

## 2020-05-06 MED ORDER — POTASSIUM CHLORIDE CRYS ER 20 MEQ PO TBCR
40.0000 meq | EXTENDED_RELEASE_TABLET | Freq: Once | ORAL | Status: AC
  Administered 2020-05-06: 40 meq via ORAL
  Filled 2020-05-06: qty 2

## 2020-05-06 MED ORDER — POTASSIUM CHLORIDE ER 10 MEQ PO TBCR
20.0000 meq | EXTENDED_RELEASE_TABLET | Freq: Two times a day (BID) | ORAL | Status: DC
  Filled 2020-05-06: qty 2

## 2020-05-06 NOTE — Progress Notes (Signed)
Patient had 2.2 second pause and converted to Sinus brady. EKG done and Oda Kilts, PA made aware. No new orders. Patient made aware

## 2020-05-06 NOTE — Progress Notes (Addendum)
Electrophysiology Rounding Note  Patient Name: Patrick Franco Date of Encounter: 05/06/2020  Primary Cardiologist: [name], MD  Electrophysiologist: New to Dr. Rayann Heman   Subjective   Pt remains in afib on Tikosyn 500 mcg BID   QTc from EKG last pm shows stable QTc at ~450 ms  The patient is doing well today.  At this time, the patient denies chest pain, shortness of breath, or any new concerns.  Inpatient Medications    Scheduled Meds: . allopurinol  100 mg Oral Daily  . apixaban  5 mg Oral BID  . aspirin EC  81 mg Oral Daily  . atorvastatin  10 mg Oral Daily  . diltiazem  240 mg Oral Daily  . dofetilide  500 mcg Oral BID  . dutasteride  0.5 mg Oral Daily  . furosemide  60 mg Oral Daily  . metoprolol tartrate  12.5 mg Oral BID  . mirtazapine  30 mg Oral QHS  . potassium chloride  20 mEq Oral BID  . potassium chloride  40 mEq Oral Once  . sodium chloride flush  3 mL Intravenous Q12H  . tamsulosin  0.4 mg Oral Daily   Continuous Infusions: . sodium chloride     PRN Meds: sodium chloride, polyvinyl alcohol, sodium chloride flush   Vital Signs    Vitals:   05/05/20 1702 05/05/20 2033 05/06/20 0450 05/06/20 0451  BP: 120/79 119/76 126/87   Pulse: 62 62 68   Resp: 14 16 18    Temp: 97.6 F (36.4 C) 97.7 F (36.5 C) 97.8 F (36.6 C)   TempSrc: Oral Oral Oral   SpO2: 96% 97% 97%   Weight:    92.9 kg  Height:        Intake/Output Summary (Last 24 hours) at 05/06/2020 0742 Last data filed at 05/05/2020 2010 Gross per 24 hour  Intake 493.39 ml  Output --  Net 493.39 ml   Filed Weights   05/05/20 1152 05/06/20 0451  Weight: 93.7 kg 92.9 kg    Physical Exam    GEN- The patient is well appearing, alert and oriented x 3 today.   Head- normocephalic, atraumatic Eyes-  Sclera clear, conjunctiva pink Ears- hearing intact Oropharynx- clear Neck- supple Lungs- Clear to ausculation bilaterally, normal work of breathing Heart- Irregularly irregular  rate and rhythm, no murmurs, rubs or gallops GI- soft, NT, ND, + BS Extremities- no clubbing, cyanosis, or edema Skin- no rash or lesion Psych- euthymic mood, full affect Neuro- strength and sensation are intact  Labs    CBC No results for input(s): WBC, NEUTROABS, HGB, HCT, MCV, PLT in the last 72 hours. Basic Metabolic Panel Recent Labs    05/05/20 1030 05/06/20 0302  NA 142 143  K 3.9 3.8  CL 105 107  CO2 26 25  GLUCOSE 123* 103*  BUN 33* 32*  CREATININE 1.19 1.03  CALCIUM 9.6 9.1  MG 2.0 2.2    Potassium  Date/Time Value Ref Range Status  05/06/2020 03:02 AM 3.8 3.5 - 5.1 mmol/L Final   Magnesium  Date/Time Value Ref Range Status  05/06/2020 03:02 AM 2.2 1.7 - 2.4 mg/dL Final    Comment:    Performed at [facility], [place] [address] [place], [place] [address]    Telemetry    ATrial fibrillation 70-90s (personally reviewed)  Radiology    No results found.   Patient Profile     Patrick Franco is a 86 y.o. male with a past medical  history significant for persistent atrial fibrillation.  They were admitted for tikosyn load.   Assessment & Plan    1. Persistent atrial fibrillation Pt remains in afib on Tikosyn 500 mcg BID  Continue Eliquis Mg stable.  K 3.8. Will supp and adjust daily dosing. CHA2DS2VASC is at least 5  2. Chronic diastolic CHF Continue lasix 60 mg daily  3. Hypokalemia Mild. Adjusted as above.   If pt does not convert chemically, plan on DCCV tomorrow    For questions or updates, please contact [place] Please consult [email or web address] for contact info under Cardiology/STEMI.  Signed, Shirley Friar, PA-C  05/06/2020, 7:42 AM   I have seen, examined the patient, and reviewed the above assessment and plan.  Changes to above are made where necessary.  On exam, iRRR.  Remains in afib.  Continue tikosyn load.  We discussed the possibility of cardioversion.  Co Sign: Thompson Grayer, MD

## 2020-05-06 NOTE — Progress Notes (Signed)
Pharmacy: Dofetilide (Tikosyn) - Follow Up Assessment and Electrolyte Replacement  Pharmacy consulted to assist in monitoring and replacing electrolytes in this 86 y.o. male admitted on 05/05/2020 undergoing dofetilide initiation. First dofetilide dose: 05/05/2020 at 2011  Labs:    Component Value Date/Time   K 3.8 05/06/2020 0302   MG 2.2 05/06/2020 0302     Plan: Potassium: K 3.8-3.9:  Give KCl 40 mEq po x1 now and maintenance regimen of KCl 66mEq BID changed to 20 mEq BID   Magnesium: Mg > 2: No additional supplementation needed  Thank you for allowing pharmacy to participate in this patient's care   Cristela Felt, PharmD PGY1 Pharmacy Resident [employer]: [phone]   05/06/2020  7:10 AM

## 2020-05-06 NOTE — Progress Notes (Signed)
Morning EKG reviewed  Shows has converted to NSR at 58 bpm with stable QTc at ~465ms.  Continue Tikosyn 500 mcg BID.   Pt will not require DCCV.   Shirley Friar, PA-C  Pager: [phone]  05/06/2020 10:49 AM

## 2020-05-07 LAB — BASIC METABOLIC PANEL
Anion gap: 10 (ref 5–15)
BUN: 30 mg/dL — ABNORMAL HIGH (ref 8–23)
CO2: 25 mmol/L (ref 22–32)
Calcium: 8.9 mg/dL (ref 8.9–10.3)
Chloride: 106 mmol/L (ref 98–111)
Creatinine, Ser: 0.93 mg/dL (ref 0.61–1.24)
GFR calc Af Amer: >60 mL/min (ref >60)
GFR calc non Af Amer: >60 mL/min (ref >60)
Glucose, Bld: 97 mg/dL (ref 70–99)
Potassium: 4 mmol/L (ref 3.5–5.1)
Sodium: 141 mmol/L (ref 135–145)

## 2020-05-07 LAB — MAGNESIUM: Magnesium: 2.2 mg/dL (ref 1.7–2.4)

## 2020-05-07 NOTE — Progress Notes (Signed)
Pharmacy: Dofetilide (Tikosyn) - Follow Up Assessment and Electrolyte Replacement  Pharmacy consulted to assist in monitoring and replacing electrolytes in this 86 y.o. male admitted on 05/05/2020 undergoing dofetilide initiation. First dofetilide dose: 05/05/2020 at 2011  Labs:    Component Value Date/Time   K 4.0 05/07/2020 0428   MG 2.2 05/07/2020 0428     Plan: Potassium: K >/= 4: No additional supplementation needed  Magnesium: Mg > 2: No additional supplementation needed  Thank you for allowing pharmacy to participate in this patient's care   Cristela Felt, PharmD PGY1 Pharmacy Resident [employer]: [phone]   05/07/2020  7:25 AM

## 2020-05-07 NOTE — Progress Notes (Signed)
Morning EKG reviewed  Shows remains in NSR at 67 bpm with stable QTc at ~465-470 ms.  Continue Tikosyn 500 mcg BID.   Pt will not require DCCV. Plan for home tomorrow if QTc remains stable.   Shirley Friar, [place]  Pager: [phone]  05/07/2020 12:31 PM

## 2020-05-07 NOTE — Care Management (Signed)
05-07-20 1510 Patient uses [facility] for medications. Dofetilide is in stock-patient will need Rx e-scribed to [facility] for 30 day supply with refills. No further needs from Case Manager at this time. Graves-Bigelow, Ocie Cornfield, RN, BSN Case Manager

## 2020-05-07 NOTE — Progress Notes (Addendum)
Electrophysiology Rounding Note  Patient Name: Patrick Franco Date of Encounter: 05/07/2020  Primary Cardiologist: [name], MD  Electrophysiologist: Dr. Rayann Heman   Subjective   Pt converted to sinus rhythm on Tikosyn 500 mcg BID; did not require DCCV  QTc from EKG last pm shows stable QTc at ~470 ms  The patient is doing well today.  At this time, the patient denies chest pain, shortness of breath, or any new concerns.  Inpatient Medications    Scheduled Meds: . allopurinol  100 mg Oral Daily  . apixaban  5 mg Oral BID  . aspirin EC  81 mg Oral Daily  . atorvastatin  10 mg Oral Daily  . diltiazem  240 mg Oral Daily  . dofetilide  500 mcg Oral BID  . dutasteride  0.5 mg Oral Daily  . furosemide  60 mg Oral Daily  . metoprolol tartrate  12.5 mg Oral BID  . mirtazapine  30 mg Oral QHS  . potassium chloride  20 mEq Oral BID  . sodium chloride flush  3 mL Intravenous Q12H  . tamsulosin  0.4 mg Oral Daily   Continuous Infusions: . sodium chloride     PRN Meds: sodium chloride, polyvinyl alcohol, sodium chloride flush   Vital Signs    Vitals:   05/06/20 0830 05/06/20 1411 05/06/20 2026 05/07/20 0557  BP: 123/77 106/69 128/87 121/79  Pulse:  60 72 67  Resp:   17 19  Temp:  97.7 F (36.5 C) 97.6 F (36.4 C) 97.7 F (36.5 C)  TempSrc:  Oral Oral Oral  SpO2:  93% 96% 100%  Weight:    92.4 kg  Height:        Intake/Output Summary (Last 24 hours) at 05/07/2020 0804 Last data filed at 05/06/2020 2241 Gross per 24 hour  Intake 843 ml  Output 1875 ml  Net -1032 ml   Filed Weights   05/05/20 1152 05/06/20 0451 05/07/20 0557  Weight: 93.7 kg 92.9 kg 92.4 kg    Physical Exam    GEN- The patient is well appearing, alert and oriented x 3 today.   Head- normocephalic, atraumatic Eyes-  Sclera clear, conjunctiva pink Ears- hearing intact Oropharynx- clear Neck- supple Lungs- Clear to ausculation bilaterally, normal work of breathing Heart- Regular rate  and rhythm, no murmurs, rubs or gallops GI- soft, NT, ND, + BS Extremities- no clubbing, cyanosis, or edema Skin- no rash or lesion Psych- euthymic mood, full affect Neuro- strength and sensation are intact  Labs    CBC No results for input(s): WBC, NEUTROABS, HGB, HCT, MCV, PLT in the last 72 hours. Basic Metabolic Panel Recent Labs    05/06/20 0302 05/07/20 0428  NA 143 141  K 3.8 4.0  CL 107 106  CO2 25 25  GLUCOSE 103* 97  BUN 32* 30*  CREATININE 1.03 0.93  CALCIUM 9.1 8.9  MG 2.2 2.2    Potassium  Date/Time Value Ref Range Status  05/07/2020 04:28 AM 4.0 3.5 - 5.1 mmol/L Final   Magnesium  Date/Time Value Ref Range Status  05/07/2020 04:28 AM 2.2 1.7 - 2.4 mg/dL Final    Comment:    Performed at [facility], [place] [address] [place], [place] [address]    Telemetry    NSR since yesterday afternoon. HRs 60-70s (personally reviewed)  Radiology    No results found.   Patient Profile     Patrick Franco is a 86 y.o. male with a past medical  history significant for persistent atrial fibrillation.  They were admitted for tikosyn load.   Assessment & Plan    1. Persistent atrial fibrillation Pt converted to sinus rhythm on Tikosyn 500 mcg BID  Continue Eliquis Electrolytes stable today. CHA2DS2VASC is at least 5  2. Chronic diastolic CHF Continue po lasix Denies SOB  3. Hypokalemia Mild, Resolved with supp yesterday.  Pt will not require DCCV. Home tomorrow if QTc remains stable.   For questions or updates, please contact [place] Please consult [email or web address] for contact info under Cardiology/STEMI.  Signed, Shirley Friar, PA-C  05/07/2020, 8:04 AM    I have seen, examined the patient, and reviewed the above assessment and plan.  Changes to above are made where necessary.  On exam, RRR.  He is now in sinus.  Qt is stable.  Continue current plan.  Co Sign: Thompson Grayer, MD

## 2020-05-08 LAB — BASIC METABOLIC PANEL WITH GFR
Anion gap: 10 (ref 5–15)
BUN: 23 mg/dL (ref 8–23)
CO2: 28 mmol/L (ref 22–32)
Calcium: 9.5 mg/dL (ref 8.9–10.3)
Chloride: 103 mmol/L (ref 98–111)
Creatinine, Ser: 1.02 mg/dL (ref 0.61–1.24)
GFR calc Af Amer: >60 mL/min
GFR calc non Af Amer: >60 mL/min
Glucose, Bld: 110 mg/dL — ABNORMAL HIGH (ref 70–99)
Potassium: 4.2 mmol/L (ref 3.5–5.1)
Sodium: 141 mmol/L (ref 135–145)

## 2020-05-08 LAB — MAGNESIUM: Magnesium: 2 mg/dL (ref 1.7–2.4)

## 2020-05-08 MED ORDER — MAGNESIUM SULFATE 2 GM/50ML IV SOLN
2.0000 g | Freq: Once | INTRAVENOUS | Status: AC
  Administered 2020-05-08: 2 g via INTRAVENOUS
  Filled 2020-05-08: qty 50

## 2020-05-08 MED ORDER — DOFETILIDE 500 MCG PO CAPS: 500.0000 ug | ORAL_CAPSULE | Freq: Two times a day (BID) | ORAL | 6 refills | Status: DC

## 2020-05-08 MED ORDER — POTASSIUM CHLORIDE CRYS ER 20 MEQ PO TBCR: 20.0000 meq | EXTENDED_RELEASE_TABLET | Freq: Two times a day (BID) | ORAL | 6 refills | Status: DC

## 2020-05-08 NOTE — Progress Notes (Signed)
Pharmacy: Dofetilide (Tikosyn) - Follow Up Assessment and Electrolyte Replacement  Pharmacy consulted to assist in monitoring and replacing electrolytes in this 86 y.o. male admitted on 05/05/2020 undergoing dofetilide initiation. First dofetilide dose: 05/05/2020 at 2011  Labs:    Component Value Date/Time   K 4.0 05/07/2020 0428   MG 2.2 05/07/2020 0428     Plan: Potassium: K >/= 4: No additional supplementation needed  Magnesium: Mg 1.8-2: Give Mg 2 gm IV x1   Thank you for allowing pharmacy to participate in this patient's care   Cristela Felt, PharmD PGY1 Pharmacy Resident [employer]: [phone]   05/08/2020  6:54 AM

## 2020-05-08 NOTE — Plan of Care (Signed)
Pt d/c to home with self care, d/c education complete

## 2020-05-08 NOTE — Care Management (Signed)
05-08-20 1043 Case Manager called [facility] and they have one bottle of Dofetilide in stock. Case Manager asked the pharmacy to hold the medication for this patient. Patient should be able to get the medication once he arrives. No further needs from Case Manager at this time. Bethena Roys, RN,BSN Case Manager

## 2020-05-08 NOTE — Discharge Summary (Signed)
ELECTROPHYSIOLOGY PROCEDURE DISCHARGE SUMMARY    Patient ID: Patrick Franco,  MRN: [id number], DOB/AGE: 01-03-1932 86 y.o.  Admit date: 05/05/2020 Discharge date: 05/08/2020  Primary Care Physician: Lavone Orn, MD  Primary Cardiologist: [name], MD  Electrophysiologist: None   Primary Discharge Diagnosis:  1.  Persistent atrial fibrillation status post Tikosyn loading this admission  Secondary Discharge Diagnosis:  2. Chronic diastolic CHF 3. Hypokalemia -> Corrected 4. PVCs (preceded tikosyn)  No Known Allergies  Procedures This Admission:  1.  Tikosyn loading  Brief HPI: Franco Patrick is a 86 y.o. male with a past medical history as noted above.  They were referred to EP in the outpatient setting for treatment options of atrial fibrillation.  Risks, benefits, and alternatives to Tikosyn were reviewed with the patient who wished to proceed.    Hospital Course:  The patient was admitted and Tikosyn was initiated.  Renal function and electrolytes were followed during the hospitalization.  Their QTc remained stable. He had borderline QTc at 490 ms on day of discharge, but repeat EKG that am in NSR showed. He converted to NSR/Sinus brady on tikosyn and did not require cardioversion.  He reverted to AF on day of discharge, but then back to NSR again after morning dose of tikosyn.  He required increase of his chronic K supp. Electrolytes stable on day of discharge.  They were monitored until discharge on telemetry which demonstrated NSR/sinus brady with intermitent AF. Pt in NSR with stable QTc at time of discharge.  On the day of discharge, they were examined by Dr. Rayann Heman  who considered them stable for discharge to home.  Follow-up has been arranged with the Atrial Fibrillation clinic in approximately 1 week and with Dr. Rayann Heman or his APP  in 4 weeks.   Physical Exam: Vitals:   05/07/20 1429 05/07/20 1439 05/07/20 2014 05/08/20 0616  BP: 106/68  125/79 105/73  Pulse:   61 63 70  Resp:  18 18 18   Temp:  97.7 F (36.5 C) 97.9 F (36.6 C) (!) 97.5 F (36.4 C)  TempSrc:  Oral Oral Oral  SpO2:  95% 95% 97%  Weight:    91.1 kg  Height:        GEN- The patient is well appearing, alert and oriented x 3 today.   HEENT: normocephalic, atraumatic; sclera clear, conjunctiva pink; hearing intact; oropharynx clear; neck supple, no JVP Lymph- no cervical lymphadenopathy Lungs- Clear to ausculation bilaterally, normal work of breathing.  No wheezes, rales, rhonchi Heart- Regular rate and rhythm, no murmurs, rubs or gallops, PMI not laterally displaced GI- soft, non-tender, non-distended, bowel sounds present, no hepatosplenomegaly Extremities- no clubbing, cyanosis, or edema; DP/PT/radial pulses 2+ bilaterally MS- no significant deformity or atrophy Skin- warm and dry, no rash or lesion Psych- euthymic mood, full affect Neuro- strength and sensation are intact   Labs:   Lab Results  Component Value Date   WBC 8.3 03/23/2020   HGB 15.2 03/23/2020   HCT 44.9 03/23/2020   MCV 96 03/23/2020   PLT 220 03/23/2020    Recent Labs  Lab 05/08/20 0704  NA 141  K 4.2  CL 103  CO2 28  BUN 23  CREATININE 1.02  CALCIUM 9.5  GLUCOSE 110*     Discharge Medications:  Allergies as of 05/08/2020   No Known Allergies     Medication List    STOP taking these medications   potassium chloride 10 MEQ tablet Commonly known as:  KLOR-CON Replaced by: potassium chloride SA 20 MEQ tablet     TAKE these medications   allopurinol 100 MG tablet Commonly known as: ZYLOPRIM TAKE 1 TABLET ONCE DAILY.   apixaban 5 MG Tabs tablet Commonly known as: ELIQUIS Take 1 tablet (5 mg total) by mouth 2 (two) times daily.   aspirin EC 81 MG tablet Take 81 mg by mouth daily.   atorvastatin 10 MG tablet Commonly known as: LIPITOR Take 10 mg by mouth daily.   diltiazem 240 MG 24 hr capsule Commonly known as: CARDIZEM CD Take 1 capsule (240 mg total) by mouth daily.     dofetilide 500 MCG capsule Commonly known as: TIKOSYN Take 1 capsule (500 mcg total) by mouth 2 (two) times daily.   dutasteride 0.5 MG capsule Commonly known as: AVODART Take 0.5 mg by mouth daily.   FISH OIL PO Take 1,200 mg by mouth daily.   Flutter Devi Use the flutter valve 10 times when doing it daily.   furosemide 40 MG tablet Commonly known as: LASIX Take 1.5 tablets (60 mg total) by mouth daily. Take 1 tablet (40mg ) twice daily for 3 days then 1 1/2 tablets (60mg ) daily What changed: additional instructions   metoprolol tartrate 25 MG tablet Commonly known as: LOPRESSOR Take 0.5 tablets (12.5 mg total) by mouth 2 (two) times daily. What changed:   how much to take  when to take this   mirtazapine 30 MG tablet Commonly known as: REMERON TAKE ONE TABLET AT BEDTIME.   multivitamin tablet Take 1 tablet by mouth daily.   potassium chloride SA 20 MEQ tablet Commonly known as: KLOR-CON Take 1 tablet (20 mEq total) by mouth 2 (two) times daily. Replaces: potassium chloride 10 MEQ tablet   PROBIOTIC DAILY PO Take 1 tablet by mouth daily.   pyridOXINE 100 MG tablet Commonly known as: VITAMIN B-6 Take 100 mg by mouth daily.   Systane 0.4-0.3 % Soln Generic drug: Polyethyl Glycol-Propyl Glycol Place 1 drop into both eyes in the morning, at noon, and at bedtime.   tamsulosin 0.4 MG Caps capsule Commonly known as: FLOMAX Take 0.4 mg by mouth daily.   Turmeric 500 MG Caps Take 500 mg by mouth daily.   vitamin B-12 1000 MCG tablet Commonly known as: CYANOCOBALAMIN Take 1,000 mcg by mouth daily.   Vitamin D (Ergocalciferol) 1.25 MG (50000 UNIT) Caps capsule Commonly known as: DRISDOL TAKE 1 CAPSULE WEEKLY. What changed: when to take this       Disposition:    Follow-up Information    [facility] Follow up on 05/14/2020.   Specialty: Cardiology Why: at 0830 for post hospital tikosyn follow up. Code for July is 4009 Contact  information: [address] [id number] [place] [phone]       Shirley Friar, PA-C Follow up on 06/19/2020.   Specialty: Physician Assistant Why: at [phone] for 1 month post tikosyn follow up. Contact information: [address] [place] [address] [phone]               Duration of Discharge Encounter: Greater than 30 minutes including physician time.  Jacalyn Lefevre, PA-C  05/08/2020 12:36 PM

## 2020-05-08 NOTE — Progress Notes (Addendum)
Evening EKG reviewed  He remained in NSR at 60 bpm with borderline QTc right at 490-500 ms.  Will reviewed and discuss dosing with Dr. Rayann Heman.   Labs pending this am.   Unfortunately, patient also went back into AF just prior to 0630; this would not affect his discharge however if QTc determined to be stable.   Would plan home today if QTc stable, but if need to dose adjust would be home tomorrow at earliest.   Annamaria Helling  Pager: [phone]  05/08/2020 7:10 AM

## 2020-05-08 NOTE — Progress Notes (Signed)
The patient has qtc 490 msec.  He has returned to afib.  If this am's qt ist stable, I would plan to discharge to home today with close follow-up in the AF clinic on his current tikosyn dose.  I have spoken with EP APP and patient by phone who are both in agreement with this plan.  Thompson Grayer MD, [place] 05/08/2020 8:41 AM

## 2020-05-14 ENCOUNTER — Other Ambulatory Visit: Payer: Self-pay

## 2020-05-14 ENCOUNTER — Encounter (HOSPITAL_COMMUNITY): Payer: Self-pay | Admitting: Nurse Practitioner

## 2020-05-14 ENCOUNTER — Ambulatory Visit (HOSPITAL_COMMUNITY)
Admission: RE | Admit: 2020-05-14 | Discharge: 2020-05-14 | Disposition: A | Discharge status: Home / Self Care | Payer: Medicare Other | Source: Ambulatory Visit | Attending: Nurse Practitioner | Admitting: Nurse Practitioner

## 2020-05-14 VITALS — BP 124/70 | HR 57 | Ht 70.0 in | Wt 205.2 lb

## 2020-05-14 DIAGNOSIS — I4892 Unspecified atrial flutter: Secondary | ICD-10-CM | POA: Insufficient documentation

## 2020-05-14 DIAGNOSIS — Z79899 Other long term (current) drug therapy: Secondary | ICD-10-CM | POA: Insufficient documentation

## 2020-05-14 DIAGNOSIS — Z7901 Long term (current) use of anticoagulants: Secondary | ICD-10-CM | POA: Insufficient documentation

## 2020-05-14 DIAGNOSIS — D6869 Other thrombophilia: Secondary | ICD-10-CM

## 2020-05-14 DIAGNOSIS — I2510 Atherosclerotic heart disease of native coronary artery without angina pectoris: Secondary | ICD-10-CM | POA: Insufficient documentation

## 2020-05-14 DIAGNOSIS — I10 Essential (primary) hypertension: Secondary | ICD-10-CM | POA: Diagnosis not present

## 2020-05-14 DIAGNOSIS — I4891 Unspecified atrial fibrillation: Secondary | ICD-10-CM | POA: Insufficient documentation

## 2020-05-14 DIAGNOSIS — I5032 Chronic diastolic (congestive) heart failure: Secondary | ICD-10-CM | POA: Insufficient documentation

## 2020-05-14 DIAGNOSIS — Z87891 Personal history of nicotine dependence: Secondary | ICD-10-CM | POA: Diagnosis not present

## 2020-05-14 DIAGNOSIS — IMO0002 Reserved for concepts with insufficient information to code with codable children: Secondary | ICD-10-CM

## 2020-05-14 DIAGNOSIS — G4730 Sleep apnea, unspecified: Secondary | ICD-10-CM | POA: Diagnosis not present

## 2020-05-14 LAB — BASIC METABOLIC PANEL
Anion gap: 9 (ref 5–15)
BUN: 26 mg/dL — ABNORMAL HIGH (ref 8–23)
CO2: 25 mmol/L (ref 22–32)
Calcium: 9.2 mg/dL (ref 8.9–10.3)
Chloride: 106 mmol/L (ref 98–111)
Creatinine, Ser: 0.99 mg/dL (ref 0.61–1.24)
GFR calc Af Amer: >60 mL/min (ref >60)
GFR calc non Af Amer: >60 mL/min (ref >60)
Glucose, Bld: 127 mg/dL — ABNORMAL HIGH (ref 70–99)
Potassium: 4.4 mmol/L (ref 3.5–5.1)
Sodium: 140 mmol/L (ref 135–145)

## 2020-05-14 LAB — MAGNESIUM: Magnesium: 2.1 mg/dL (ref 1.7–2.4)

## 2020-05-14 MED ORDER — METOPROLOL TARTRATE 25 MG PO TABS
ORAL_TABLET | ORAL | Status: DC
Start: 2020-05-14 — End: 2020-09-16

## 2020-05-14 MED ORDER — FUROSEMIDE 40 MG PO TABS
ORAL_TABLET | ORAL | Status: DC
Start: 2020-05-14 — End: 2021-09-20

## 2020-05-14 NOTE — Progress Notes (Signed)
Primary Care Physician: Lavone Orn, MD Referring Physician: Richardson Dopp, PA Cardiologist: Dr. Salli Quarry Patrick Franco is a 86 y.o. male with a h/o CAD, diastolic HF, aortic stenosis,MVP, HTN,  seen by Dr. Burt Knack on 03/05/2020. He reported feeling terrible. He had gained 10-15 lbs over the past two weeks. He had dyspnea with minimal exertion. He had lower extremity edema and abdominal bloating. He denied  chest pain, dizziness, near syncope, syncope, or awareness of his heart racing.    EKG in [facility] office showed atrial flutter with RVR.Patient was directly admitted for IV diuresis and management of atrial flutter.  he had successful TEE/DCCV. He saw Richardson Dopp PA on f/u. He still had shortness of breath and fatigue. He was found to be back in afib. He suggested AAD therapy.  He felt that his pulmonary issues may make amiodarone not a good option. He suggested Tikosyn  and asked him to f/u here for further discussion.   [name] also increased his lasix for several days. Weight is down a few lbs but still has fatigue and shortness of breath. EKg today shows afib at 68 bpm.  Pt is in afib clinic for Tikosyn admit, 6/22. [name] He continues to have no energy in afib, he is short of breath on exertion.  He  has not missed any DOAC doses, no benadryl use.  Qtc in afib acceptable at 435. Pharm D screened drugs and verapamil  was changed to Cardizem several weeks ago. Mirtazapine is not contraindicated, but can be qt prolonging. He is aware of the cost of the drug.   F/u in afib clinic, 7/1 after Tikosyn admit. He is staying in  rhythm and feels better. He is being compliant with tikosyn. qtc is stable. He continues on eliquis 5 mg bid for a CHA2DS2VASc score of 5.   Today, he denies symptoms of palpitations, chest pain, shortness of breath, orthopnea, PND, lower extremity edema, dizziness, presyncope, syncope, or neurologic sequela. The patient is tolerating medications without difficulties and is  otherwise without complaint today.   Past Medical History:  Diagnosis Date  . Aortic stenosis   . Arthritis   . Atrial fibrillation ([place])   . BPH (benign prostatic hyperplasia)   . Chronic low back pain   . Depression   . Diverticulitis   . Elevated PSA   . Foot drop, right   . Gait disorder 02/03/2015  . Hip pain   . History of colonoscopy   . HOH (hard of hearing)    hearing aid  . Hypertension   . Lumbar radiculopathy   . Mitral valve prolapse   . Onychomycosis   . Peptic ulcer disease   . Sepsis ([place]) 2005   e. coli-after prostate bx  . Sleep apnea   . Torn ACL   . Tracheobronchomalacia    seen on 2019 Chest CT   Past Surgical History:  Procedure Laterality Date  . CARDIOVERSION N/A 03/27/2020   Procedure: CARDIOVERSION;  Surgeon: Skeet Latch, MD;  Location: [facility];  Service: Cardiovascular;  Laterality: N/A;  . CATARACT EXTRACTION Bilateral   . ESOPHAGOGASTRODUODENOSCOPY N/A 08/19/2015   Procedure: ESOPHAGOGASTRODUODENOSCOPY (EGD);  Surgeon: Laurence Spates, MD;  Location[facility] ENDOSCOPY;  Service: Endoscopy;  Laterality: N/A;  . right knee replacement Right 1996  . SHOULDER SURGERY    . TEE WITHOUT CARDIOVERSION N/A 03/27/2020   Procedure: TRANSESOPHAGEAL ECHOCARDIOGRAM (TEE);  Surgeon: Skeet Latch, MD;  Location: [facility];  Service: Cardiovascular;  Laterality: N/A;  .  TONSILLECTOMY    . TOTAL HIP ARTHROPLASTY Right 08/13/2019   Procedure: RIGHT TOTAL HIP ARTHROPLASTY ANTERIOR APPROACH;  Surgeon: Mcarthur Rossetti, MD;  Location: [place];  Service: Orthopedics;  Laterality: Right;    Current Outpatient Medications  Medication Sig Dispense Refill  . allopurinol (ZYLOPRIM) 100 MG tablet TAKE 1 TABLET ONCE DAILY. (Patient taking differently: Take 100 mg by mouth daily. ) 90 tablet 0  . apixaban (ELIQUIS) 5 MG TABS tablet Take 1 tablet (5 mg total) by mouth 2 (two) times daily. 60 tablet 5  . aspirin EC 81 MG tablet Take 81 mg by mouth daily.     [name] atorvastatin (LIPITOR) 10 MG tablet Take 10 mg by mouth daily.   10  . diltiazem (CARDIZEM CD) 240 MG 24 hr capsule Take 1 capsule (240 mg total) by mouth daily. 30 capsule 3  . dofetilide (TIKOSYN) 500 MCG capsule Take 1 capsule (500 mcg total) by mouth 2 (two) times daily. 60 capsule 6  . dutasteride (AVODART) 0.5 MG capsule Take 0.5 mg by mouth daily.  2  . furosemide (LASIX) 40 MG tablet Taking 1.5 tablets by mouth daily    . metoprolol tartrate (LOPRESSOR) 25 MG tablet Taking 1 tablet in the am and 1/2 tablet by mouth in the evening    . mirtazapine (REMERON) 30 MG tablet TAKE ONE TABLET AT BEDTIME. (Patient taking differently: Take 30 mg by mouth at bedtime. ) 90 tablet 0  . Multiple Vitamin (MULTIVITAMIN) tablet Take 1 tablet by mouth daily.    . Omega-3 Fatty Acids (FISH OIL PO) Take 1,200 mg by mouth daily.     Vladimir Faster Glycol-Propyl Glycol (SYSTANE) 0.4-0.3 % SOLN Place 1 drop into both eyes in the morning, at noon, and at bedtime.     . potassium chloride (KLOR-CON) 20 MEQ tablet Take 1 tablet (20 mEq total) by mouth 2 (two) times daily. 60 tablet 6  . Probiotic Product (PROBIOTIC DAILY PO) Take 1 tablet by mouth daily.    [name] pyridOXINE (VITAMIN B-6) 100 MG tablet Take 100 mg by mouth daily.    [name] Respiratory Therapy Supplies (FLUTTER) DEVI Use the flutter valve 10 times when doing it daily. 1 each 0  . tamsulosin (FLOMAX) 0.4 MG CAPS capsule Take 0.4 mg by mouth daily.  2  . Turmeric 500 MG CAPS Take 500 mg by mouth daily.    . vitamin B-12 (CYANOCOBALAMIN) 1000 MCG tablet Take 1,000 mcg by mouth daily.    . Vitamin D, Ergocalciferol, (DRISDOL) 1.25 MG (50000 UNIT) CAPS capsule TAKE 1 CAPSULE WEEKLY. (Patient taking differently: Take 50,000 Units by mouth every 7 (seven) days. ) 12 capsule 0   No current facility-administered medications for this encounter.    No Known Allergies  Social History   Socioeconomic History  . Marital status: Married    Spouse name: Not on file   . Number of children: 2  . Years of education: Not on file  . Highest education level: Not on file  Occupational History  . Occupation: Retired Social research officer, government  Tobacco Use  . Smoking status: Former Smoker    Packs/day: 0.50    Types: Cigarettes    Start date: 1960    Quit date: 1961    Years since quitting: 60.5  . Smokeless tobacco: Never Used  . Tobacco comment: Pt smoked for 67month when in [employer]  Vaping Use  . Vaping Use: Never used  Substance and Sexual Activity  . Alcohol use: Not  on file  . Drug use: No  . Sexual activity: Yes  Other Topics Concern  . Not on file  Social History Narrative   Patient is right handed.   Patient drinks 1 cup of coffee per day.   Social Determinants of Health   Financial Resource Strain:   . Difficulty of Paying Living Expenses:   Food Insecurity:   . Worried About Charity fundraiser in the Last Year:   . Arboriculturist in the Last Year:   Transportation Needs:   . Film/video editor (Medical):   [name] Lack of Transportation (Non-Medical):   Physical Activity:   . Days of Exercise per Week:   . Minutes of Exercise per Session:   Stress:   . Feeling of Stress :   Social Connections:   . Frequency of Communication with Friends and Family:   . Frequency of Social Gatherings with Friends and Family:   . Attends Religious Services:   . Active Member of Clubs or Organizations:   . Attends Archivist Meetings:   [name] Marital Status:   Intimate Partner Violence:   . Fear of Current or Ex-Partner:   . Emotionally Abused:   [name] Physically Abused:   . Sexually Abused:     Family History  Problem Relation Age of Onset  . Pneumonia Mother 72  . Cancer - Lung Father 51  . Multiple myeloma Brother 55    ROS- All systems are reviewed and negative except as per the HPI above  Physical Exam: Vitals:   05/14/20 0828  BP: 124/70  Pulse: (!) 57  Weight: 93.1 kg  Height: 5' 10"  (1.778 m)   Wt Readings from Last 3  Encounters:  05/14/20 93.1 kg  05/08/20 91.1 kg  05/05/20 94.2 kg    Labs: Lab Results  Component Value Date   NA 141 05/08/2020   K 4.2 05/08/2020   CL 103 05/08/2020   CO2 28 05/08/2020   GLUCOSE 110 (H) 05/08/2020   BUN 23 05/08/2020   CREATININE 1.02 05/08/2020   CALCIUM 9.5 05/08/2020   MG 2.0 05/08/2020   No results found for: INR Lab Results  Component Value Date   CHOL 96 03/06/2020   HDL 48 03/06/2020   LDLCALC 38 03/06/2020   TRIG 50 03/06/2020     GEN- The patient is well appearing, alert and oriented x 3 today.   Head- normocephalic, atraumatic Eyes-  Sclera clear, conjunctiva pink Ears- hearing intact Oropharynx- clear Neck- supple, no JVP Lymph- no cervical lymphadenopathy Lungs- Clear to ausculation bilaterally, normal work of breathing Heart- regular rate and rhythm, no murmurs, rubs or gallops, PMI not laterally displaced GI- soft, NT, ND, + BS Extremities- no clubbing, cyanosis, or edema MS- no significant deformity or atrophy Skin- no rash or lesion Psych- euthymic mood, full affect Neuro- strength and sensation are intact  EKG-  Sinus brady at 57 bpm with a first degree AVB, pr int 200 ms, qrs int 86 ms, qtc 461 ms (stable)   Echo-1. Left ventricular ejection fraction, by estimation, is 60 to 65%. The left ventricle has normal function. The left ventricle has no regional wall motion abnormalities. 2. Prominent moderator band. Right ventricular systolic function is normal. The right ventricular size is normal. 3. Left atrial size was severely dilated. No left atrial/left atrial appendage thrombus was detected. 4. Right atrial size was mildly dilated. 5. The mitral valve is degenerative. Moderate mitral valve regurgitation. No evidence of mitral  stenosis. 6. The aortic valve is tricuspid. Aortic valve regurgitation is mild. Mild aortic valve stenosis. 7. Aortic dilatation noted. There is mild dilatation of the ascending aorta measuring 38  mm. There is mild (Grade II) atheroma plaque involving the transverse aorta. 8. The inferior vena cava is normal in size with greater than 50% respiratory variability, suggesting right atrial pressure of 3 mmHg.    Assessment and Plan: 1. Afib/flutter  He  is quite symptomatic with atrial fibrillation ERAF after successful cardioversion 5/14 He is now on tikosyn 500 mcg bid and staying in SR  No  benadryl Bmet/mag   2. CHA2DS2VASc score of at least 5 Continue  eliquis 5 mg bid  No  missed   doses  3. Diastolic HF Continue  lasix at 60 mg daily Avoid salt  Daily weights   f/u with Richardson Dopp, PA 8/2 and Oda Kilts, PA, 8/6  Geroge Baseman. , [facility] [address] [place], [place] [address] [phone]

## 2020-05-15 ENCOUNTER — Encounter (HOSPITAL_COMMUNITY): Payer: Medicare Other | Admitting: Physician Assistant

## 2020-05-20 NOTE — Progress Notes (Signed)
I spoke with the pt's spouse. She states that she just spoke with someone from here. She states that appt was scheduled to discuss these results. She states that the pt is "not in the mood" to talk to anyone about his results at the time b/c he is busy watching a soccer game. He is scheduled for 06/30/20. She wanted the results to be forwarded to Dr Laurann Montana, PVP and I have done so.

## 2020-05-29 ENCOUNTER — Other Ambulatory Visit: Payer: Self-pay | Admitting: Family Medicine

## 2020-06-13 NOTE — Progress Notes (Addendum)
Cardiology Office Note:    Date:  06/15/2020   ID:  Patrick Franco, DOB 1932/03/11, MRN [id number]  PCP:  Patrick Orn, MD  Cardiologist:  Patrick Chandler, MD    Electrophysiologist:  Patrick Grayer, MD   Referring MD: Patrick Orn, MD   Chief Complaint:    Follow-up (AFib, CHF)    Patient Profile:    Patrick Franco is a 86 y.o. male with:   Coronary artery disease ? CT 12/2017: LM and 3 v coronary atherosclerosis  ? Myoview 03/2017: no ischemia   Aortic stenosis  ? Echocardiogram 02/2020: EF 60-65, mild AS (mean 17 mmHg), mild MVP w mild MR  Diastolic CHF  Atrial Fibrillation/Flutter ? New onset 02/2020 >> c/b acute diastolic CHF; admitted to hosp ? S/p TEE-DCCV 03/2020 ? Dofetilide started 6.2021  Hypertension   MVP ? Mild MR Echocardiogram 02/2020  Sleep apnea   PACs  Aortic atherosclerosis (CT 12/2017)    Prior CV studies: TEE 03/27/20 EF 60-65, normal RVSF, mild RAE, severe LAE, mod MR, mild AI, mild dilation of asc Ao  Echocardiogram 03/05/2020 EF 60-65, no RWMA, severe LVH, normal RVSF, severe LAE, mod RAE, mild MVP with mild MR, mild AI, mild AS (mean 17 mmHg).    Myoview 04/07/17 EF 61, no ischemia or infarction.  Low Risk    History of Present Illness:    Patrick Franco was last seen in clinic in 04/2020.  He had recently undergone a TEE-DCCV for AFlutter with RVR.  He was in atrial fibrillation when I last saw him and he was symptomatic with evidence of decompensated CHF.  I set him up with the AFib Clinic to consider Dofetilide.  He was admitted in 04/2020 for Dofetilide load.  He converted to normal sinus rhythm without DCCV.  He was last seen in the AFib clinic 05/14/20 and was maintaining sinus rhythm.  He returns for follow up.  He is here with his wife.  He is feeling much better in normal sinus rhythm.  He notes less shortness of breath.  He has no had chest pain, significant leg swelling, orthopnea, syncope.  He does have some issues with his balance.    Past  Medical History:  Diagnosis Date  . Aortic stenosis   . Arthritis   . Atrial fibrillation ([place])   . BPH (benign prostatic hyperplasia)   . Chronic low back pain   . Depression   . Diverticulitis   . Elevated PSA   . Foot drop, right   . Gait disorder 02/03/2015  . Hip pain   . History of colonoscopy   . HOH (hard of hearing)    hearing aid  . Hypertension   . Lumbar radiculopathy   . Mitral valve prolapse   . Onychomycosis   . Peptic ulcer disease   . Sepsis ([place]) 2005   e. coli-after prostate bx  . Sleep apnea   . Torn ACL   . Tracheobronchomalacia    seen on 2019 Chest CT    Current Medications: Current Meds  Medication Sig  . allopurinol (ZYLOPRIM) 100 MG tablet TAKE 1 TABLET ONCE DAILY.  [name] apixaban (ELIQUIS) 5 MG TABS tablet Take 1 tablet (5 mg total) by mouth 2 (two) times daily.  [name] atorvastatin (LIPITOR) 10 MG tablet Take 10 mg by mouth daily.   [name] diltiazem (CARDIZEM CD) 240 MG 24 hr capsule Take 1 capsule (240 mg total) by mouth daily.  [name] dofetilide (TIKOSYN) 500 MCG capsule Take 1 capsule (  500 mcg total) by mouth 2 (two) times daily.  [name] dutasteride (AVODART) 0.5 MG capsule Take 0.5 mg by mouth daily.  . furosemide (LASIX) 40 MG tablet Taking 1.5 tablets by mouth daily  . metoprolol tartrate (LOPRESSOR) 25 MG tablet Taking 1 tablet in the am and 1/2 tablet by mouth in the evening  . mirtazapine (REMERON) 30 MG tablet TAKE ONE TABLET AT BEDTIME.  . Multiple Vitamin (MULTIVITAMIN) tablet Take 1 tablet by mouth daily.  . Omega-3 Fatty Acids (FISH OIL PO) Take 1,200 mg by mouth daily.   Vladimir Faster Glycol-Propyl Glycol (SYSTANE) 0.4-0.3 % SOLN Place 1 drop into both eyes in the morning, at noon, and at bedtime.   . potassium chloride (KLOR-CON) 20 MEQ tablet Take 1 tablet (20 mEq total) by mouth 2 (two) times daily.  . Probiotic Product (PROBIOTIC DAILY PO) Take 1 tablet by mouth daily.  [name] pyridOXINE (VITAMIN B-6) 100 MG tablet Take 100 mg by mouth daily.  [name]  Respiratory Therapy Supplies (FLUTTER) DEVI Use the flutter valve 10 times when doing it daily.  . tamsulosin (FLOMAX) 0.4 MG CAPS capsule Take 0.4 mg by mouth daily.  . Turmeric 500 MG CAPS Take 500 mg by mouth daily.  . vitamin B-12 (CYANOCOBALAMIN) 1000 MCG tablet Take 1,000 mcg by mouth daily.  . Vitamin D, Ergocalciferol, (DRISDOL) 1.25 MG (50000 UNIT) CAPS capsule TAKE 1 CAPSULE WEEKLY.  . [DISCONTINUED] aspirin EC 81 MG tablet Take 81 mg by mouth daily.     Allergies:   Patient has no known allergies.   Social History   Tobacco Use  . Smoking status: Former Smoker    Packs/day: 0.50    Types: Cigarettes    Start date: 1960    Quit date: 1961    Years since quitting: 60.6  . Smokeless tobacco: Never Used  . 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 Vaping Use: Never used  Substance Use Topics  . Alcohol use: Not on file  . Drug use: No     Family Hx: The patient's family history includes Cancer - Lung (age of onset: 568 in his father; Multiple myeloma (age of onset: 596 in his brother; Pneumonia (age of onset: 831 in his mother.  Review of Systems  Gastrointestinal: Negative for hematochezia and melena.      EKGs/Labs/Other Test Reviewed:    EKG:  EKG is    ordered today.  The ekg ordered today demonstrates normal sinus rhythm, HR 62, normal axis, 1st degree AVB (PR 242 ms), QTc 468 ms, no change since last tracing.   Recent Labs: 02/19/2020: NT-Pro BNP 721 03/05/2020: ALT 71; B Natriuretic Peptide 355.3; TSH 2.464 03/23/2020: Hemoglobin 15.2; Platelets 220 05/14/2020: BUN 26; Creatinine, Ser 0.99; Magnesium 2.1; Potassium 4.4; Sodium 140   Recent Lipid Panel Lab Results  Component Value Date/Time   CHOL 96 03/06/2020 03:30 AM   TRIG 50 03/06/2020 03:30 AM   HDL 48 03/06/2020 03:30 AM   CHOLHDL 2.0 03/06/2020 03:30 AM   LDLCALC 38 03/06/2020 03:30 AM    Physical Exam:    VS:  BP (!) 120/62   Pulse 68   Ht 5' 10"  (1.778 m)   Wt (!)  206 lb (93.4 kg)   SpO2 96%   BMI 29.56 kg/m     Wt Readings from Last 3 Encounters:  06/15/20 (!) 206 lb (93.4 kg)  05/14/20 205 lb 3.2 oz (93.1 kg)  05/08/20 200 lb 14.4  oz (91.1 kg)     Constitutional:      Appearance: Healthy appearance. Not in distress.  Neck:     Vascular: JVD normal.  Pulmonary:     Effort: Pulmonary effort is normal.     Breath sounds: No wheezing. No rales.  Cardiovascular:     Normal rate. Regular rhythm. Normal S1. Normal S2.     Murmurs: There is a grade 2/6 crescendo-decrescendo systolic murmur at the URSB.  Edema:    Ankle: bilateral trace edema of the ankle. Abdominal:     Palpations: Abdomen is soft.  Skin:    General: Skin is warm and dry.  Neurological:     General: No focal deficit present.     Mental Status: Alert and oriented to person, place and time.     Cranial Nerves: Cranial nerves are intact.        ASSESSMENT & PLAN:    1. Atrial fibrillation/flutter ([place]) Maintaining normal sinus rhythm on Dofetilide.  We obtained his ECG today.  Therefore, his appt with Oda Kilts, PA-C (EP) will be canceled.  I will have him follow up with Dr. Rayann Heman in 3 mos.  Recent K+, Mg2+ and Creatinine normal.    2. Coronary artery disease involving native coronary artery of native heart without angina pectoris He is not having anginal symptoms.  Continue statin Rx.  As he is on Apixaban, he can DC ASA.  3. Chronic diastolic CHF (congestive heart failure) (HCC) Volume status seems stable.  Continue current dose of Furosemide.    4. Essential hypertension The patient's blood pressure is controlled on his current regimen.  Continue current therapy.    5. Aortic stenosis Mild by echocardiogram in 02/2020.  Consider repeat echocardiogram in 2-3 years or if symptoms suggest worsening.        Dispo:  Return in about 3 months (around 09/15/2020) for Routine Follow Up w/ Dr. Rayann Heman.   Medication Adjustments/Labs and Tests Ordered: Current medicines  are reviewed at length with the patient today.  Concerns regarding medicines are outlined above.  Tests Ordered: Orders Placed This Encounter  Procedures  . EKG 12-Lead   Medication Changes: No orders of the defined types were placed in this encounter.   Signed, Richardson Dopp, PA-C  06/15/2020 9:54 AM    [facility] [address], [place], [place]  [address] Phone: [phone]; Fax: [phone]

## 2020-06-15 ENCOUNTER — Ambulatory Visit (INDEPENDENT_AMBULATORY_CARE_PROVIDER_SITE_OTHER): Payer: Medicare Other | Admitting: Physician Assistant

## 2020-06-15 ENCOUNTER — Other Ambulatory Visit: Payer: Self-pay

## 2020-06-15 ENCOUNTER — Encounter: Payer: Self-pay | Admitting: Physician Assistant

## 2020-06-15 VITALS — BP 120/62 | HR 68 | Ht 70.0 in | Wt 206.0 lb

## 2020-06-15 DIAGNOSIS — IMO0002 Reserved for concepts with insufficient information to code with codable children: Secondary | ICD-10-CM

## 2020-06-15 DIAGNOSIS — I10 Essential (primary) hypertension: Secondary | ICD-10-CM | POA: Diagnosis not present

## 2020-06-15 DIAGNOSIS — I4892 Unspecified atrial flutter: Secondary | ICD-10-CM | POA: Diagnosis not present

## 2020-06-15 DIAGNOSIS — I5032 Chronic diastolic (congestive) heart failure: Secondary | ICD-10-CM | POA: Diagnosis not present

## 2020-06-15 DIAGNOSIS — I2510 Atherosclerotic heart disease of native coronary artery without angina pectoris: Secondary | ICD-10-CM | POA: Diagnosis not present

## 2020-06-15 DIAGNOSIS — I350 Nonrheumatic aortic (valve) stenosis: Secondary | ICD-10-CM

## 2020-06-15 DIAGNOSIS — I4891 Unspecified atrial fibrillation: Secondary | ICD-10-CM | POA: Diagnosis not present

## 2020-06-15 NOTE — Patient Instructions (Addendum)
Medication Instructions:  Your physician has recommended you make the following change in your medication:   1) Stop Aspirin 81 mg  *If you need a refill on your cardiac medications before your next appointment, please call your pharmacy*  Lab Work: None ordered today  Testing/Procedures: None ordered today  Follow-Up: At [facility], you and your health needs are our priority.  As part of our continuing mission to provide you with exceptional heart care, we have created designated Provider Care Teams.  These Care Teams include your primary Cardiologist (physician) and Advanced Practice Providers (APPs -  Physician Assistants and Nurse Practitioners) who all work together to provide you with the care you need, when you need it.  We recommend signing up for the patient portal called "MyChart".  Sign up information is provided on this After Visit Summary.  MyChart is used to connect with patients for Virtual Visits (Telemedicine).  Patients are able to view lab/test results, encounter notes, upcoming appointments, etc.  Non-urgent messages can be sent to your provider as well.   To learn more about what you can do with MyChart, go to [email or web address].    Your next appointment:   6 month(s)  The format for your next appointment:   In Person  Provider:   [name], MD  Other Instructions Follow up with Dr. Rayann Heman on 09/16/20 at 10:30AM

## 2020-06-19 ENCOUNTER — Ambulatory Visit: Payer: Medicare Other | Admitting: Student

## 2020-06-30 ENCOUNTER — Encounter: Payer: Self-pay | Admitting: Pulmonary Disease

## 2020-06-30 ENCOUNTER — Other Ambulatory Visit: Payer: Self-pay

## 2020-06-30 ENCOUNTER — Ambulatory Visit (INDEPENDENT_AMBULATORY_CARE_PROVIDER_SITE_OTHER): Payer: Medicare Other | Admitting: Pulmonary Disease

## 2020-06-30 VITALS — BP 120/58 | HR 63 | Temp 97.4°F | Ht 68.5 in | Wt 205.6 lb

## 2020-06-30 DIAGNOSIS — J398 Other specified diseases of upper respiratory tract: Secondary | ICD-10-CM

## 2020-06-30 DIAGNOSIS — R0602 Shortness of breath: Secondary | ICD-10-CM | POA: Diagnosis not present

## 2020-06-30 DIAGNOSIS — G4733 Obstructive sleep apnea (adult) (pediatric): Secondary | ICD-10-CM

## 2020-06-30 DIAGNOSIS — I4891 Unspecified atrial fibrillation: Secondary | ICD-10-CM

## 2020-06-30 NOTE — Assessment & Plan Note (Signed)
Plan: Continue Tikosyn Continue follow-up with cardiology

## 2020-06-30 NOTE — Progress Notes (Signed)
@Patient  ID: Patrick Franco, male    DOB: 28-Dec-1931, 86 y.o.   MRN: [id number]  Chief Complaint  Patient presents with  . Follow-up    Follow-up for dyspnea, I reviewed pulmonary function testing results    Referring provider: Lavone Orn, MD  HPI:  86 year old male former smoker followed in our office for tracheobronchomalacia, obstructive sleep apnea, and dyspnea on exertion  PMH: Hypertension, BPH, obesity, depression, nonrheumatic aortic stenosis, aortic stenosis, PACs, CAD Smoker/ Smoking History: Former smoker.  Limited smoking history.  Quit 1961 Maintenance:  none Pt of: None Dr. Chase Caller  06/30/2020  - Visit   86 year old male former smoker followed in our office for at last office visit patient had a pulmonary function testing ordered.  He also had lab work as well as a trial of [employer].  He was requested to have follow-up in 4 weeks with Dr. Chase Caller.  This was never completed.  03/04/2020-pulmonary function test-FVC 2.14 (58% predicted), postbronchodilator ratio 76, postbronchodilator FEV1 1.60 (63% predicted), no bronchodilator response, mid flow reversibility, TLC 2.73 (39% predicted), DLCO 15.16 (65% predicted)  After last office visit patient has been established with cardiology who has been working with the patient for A. fib/a flutter management.  Patient reporting today that symptoms are much better manage since starting Tikosyn.  He feels that his shortness of breath has improved significantly.  He is able to do Pilates 1 time a week, going to a personal trainer 1 time a week, exercising on a recumbent bike for 30 minutes at a time as well as doing walking inside of a pool for about 30 minutes.  This is a significant improvement from where he was in April/2021.  Patient did not notice significant improvement with Breo Ellipta.  He is no longer taking this inhaler.  Questionaires / Pulmonary Flowsheets:   ACT:  No flowsheet data found.  MMRC: mMRC Dyspnea  Scale mMRC Score  06/30/2020 1  02/19/2020 3    Epworth:  No flowsheet data found.  Tests:   12/27/2017-CT chest high resolution-tracheobronchial malacia, moderate patchy air trapping in both lungs indicative of small airways disease, scattered mild cylindrical varicoid bronchiectasis in both lungs, no other evidence of interstitial lung disease, borderline mild cardiomegaly, dilated main pulmonary artery suggestive of pulmonary arterial hypertension, left main three-vessel coronary artery disease  06/12/2019-echocardiogram-LV ejection fraction greater than 65%, right ventricle is normal systolic function, left atrial size moderately dilated, right atrial size mildly dilated, moderate mitral valve prolapse, mitral valve is abnormal   FENO:  No results found for: NITRICOXIDE  PFT: PFT Results Latest Ref Rng & Units 03/04/2020  FVC-Pre L 2.14  FVC-Predicted Pre % 58  FVC-Post L 2.10  FVC-Predicted Post % 57  Pre FEV1/FVC % % 69  Post FEV1/FCV % % 76  FEV1-Pre L 1.48  FEV1-Predicted Pre % 58  FEV1-Post L 1.60  DLCO uncorrected ml/min/mmHg 15.16  DLCO UNC% % 65  DLCO corrected ml/min/mmHg 15.16  DLCO COR %Predicted % 65  DLVA Predicted % 101    WALK:  SIX MIN WALK 02/19/2020 09/12/2018 12/08/2017  Supplimental Oxygen during Test? (L/min) No No No  Tech Comments: Walked 2 laps no complaints. - Pt walked at a slow pace completing all required laps. Only complaint was mild SOB.    Imaging: No results found.  Lab Results:  CBC    Component Value Date/Time   WBC 8.3 03/23/2020 1208   WBC 5.8 03/08/2020 0550   RBC 4.68 03/23/2020 1208  RBC 4.24 03/08/2020 0550   HGB 15.2 03/23/2020 1208   HCT 44.9 03/23/2020 1208   PLT 220 03/23/2020 1208   MCV 96 03/23/2020 1208   MCH 32.5 03/23/2020 1208   MCH 33.0 03/08/2020 0550   MCHC 33.9 03/23/2020 1208   MCHC 33.2 03/08/2020 0550   RDW 13.7 03/23/2020 1208   LYMPHSABS 1.4 03/05/2020 1938   MONOABS 0.4 03/05/2020 1938   EOSABS  0.1 03/05/2020 1938   BASOSABS 0.0 03/05/2020 1938    BMET    Component Value Date/Time   NA 140 05/14/2020 0855   NA 144 04/24/2020 1428   K 4.4 05/14/2020 0855   CL 106 05/14/2020 0855   CO2 25 05/14/2020 0855   GLUCOSE 127 (H) 05/14/2020 0855   BUN 26 (H) 05/14/2020 0855   BUN 29 (H) 04/24/2020 1428   CREATININE 0.99 05/14/2020 0855   CALCIUM 9.2 05/14/2020 0855   GFRNONAA >60 05/14/2020 0855   GFRAA >60 05/14/2020 0855    BNP    Component Value Date/Time   BNP 355.3 (H) 03/05/2020 1938    ProBNP    Component Value Date/Time   PROBNP 721 (H) 02/19/2020 1008    Specialty Problems      Pulmonary Problems   Sleep apnea   Pulmonary air trapping   Shortness of breath   Tracheobronchomalacia      No Known Allergies  Immunization History  Administered Date(s) Administered  . Influenza Split 08/29/2012  . Influenza, High Dose Seasonal PF 09/10/2017, 08/14/2018, 08/29/2019  . Influenza,inj,Quad PF,6+ Mos 09/12/2013, 09/11/2015  . Moderna SARS-COVID-2 Vaccination 11/21/2019, 12/22/2019  . Pneumococcal Conjugate-13 01/13/2014  . Pneumococcal Polysaccharide-23 11/14/2004  . Tdap 01/18/2016    Past Medical History:  Diagnosis Date  . Aortic stenosis   . Arthritis   . Atrial fibrillation ([place])   . BPH (benign prostatic hyperplasia)   . Chronic low back pain   . Depression   . Diverticulitis   . Elevated PSA   . Foot drop, right   . Gait disorder 02/03/2015  . Hip pain   . History of colonoscopy   . HOH (hard of hearing)    hearing aid  . Hypertension   . Lumbar radiculopathy   . Mitral valve prolapse   . Onychomycosis   . Peptic ulcer disease   . Sepsis ([place]) 2005   e. coli-after prostate bx  . Sleep apnea   . Torn ACL   . Tracheobronchomalacia    seen on 2019 Chest CT    Tobacco History: Social History   Tobacco Use  Smoking Status Former Smoker  . Packs/day: 0.50  . Types: Cigarettes  . Start date: 77  . Quit date: 81  . 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 allopurinol (ZYLOPRIM) 100 MG tablet TAKE 1 TABLET ONCE DAILY.  [name] apixaban (ELIQUIS) 5 MG TABS tablet Take 1 tablet (5 mg total) by mouth 2 (two) times daily.  [name] atorvastatin (LIPITOR) 10 MG tablet Take 10 mg by mouth daily.   [name] diltiazem (CARDIZEM CD) 240 MG 24 hr capsule Take 1 capsule (240 mg total) by mouth daily.  [name] dofetilide (TIKOSYN) 500 MCG capsule Take 1 capsule (500 mcg total) by mouth 2 (two) times  daily.  . dutasteride (AVODART) 0.5 MG capsule Take 0.5 mg by mouth daily.  . furosemide (LASIX) 40 MG tablet Taking 1.5 tablets by mouth daily  . metoprolol tartrate (LOPRESSOR) 25 MG tablet Taking 1 tablet in the am and 1/2 tablet by mouth in the evening  . mirtazapine (REMERON) 30 MG tablet TAKE ONE TABLET AT BEDTIME.  . Multiple Vitamin (MULTIVITAMIN) tablet Take 1 tablet by mouth daily.  . Omega-3 Fatty Acids (FISH OIL PO) Take 1,200 mg by mouth daily.   Vladimir Faster Glycol-Propyl Glycol (SYSTANE) 0.4-0.3 % SOLN Place 1 drop into both eyes in the morning, at noon, and at bedtime.   . potassium chloride (KLOR-CON) 20 MEQ tablet Take 1 tablet (20 mEq total) by mouth 2 (two) times daily.  . Probiotic Product (PROBIOTIC DAILY PO) Take 1 tablet by mouth daily.  [name] pyridOXINE (VITAMIN B-6) 100 MG tablet Take 100 mg by mouth daily.  [name] Respiratory Therapy Supplies (FLUTTER) DEVI Use the flutter valve 10 times when doing it daily.  . tamsulosin (FLOMAX) 0.4 MG CAPS capsule Take 0.4 mg by mouth daily.  . Turmeric 500 MG CAPS Take 500 mg by mouth daily.  . vitamin B-12 (CYANOCOBALAMIN) 1000 MCG tablet Take 1,000 mcg by mouth daily.  . Vitamin D, Ergocalciferol, (DRISDOL) 1.25 MG (50000 UNIT) CAPS capsule TAKE 1 CAPSULE  WEEKLY.   No facility-administered encounter medications on file as of 06/30/2020.     Review of Systems  Review of Systems  Constitutional: Positive for fatigue. Negative for activity change, chills, fever and unexpected weight change.  HENT: Negative for postnasal drip, rhinorrhea, sinus pressure, sinus pain and sore throat.   Eyes: Negative.   Respiratory: Negative for cough, shortness of breath and wheezing.   Cardiovascular: Negative for chest pain and palpitations.  Gastrointestinal: Negative for constipation, diarrhea, nausea and vomiting.  Endocrine: Negative.   Genitourinary: Negative.   Musculoskeletal: Negative.   Skin: Negative.   Neurological: Negative for dizziness and headaches.  Psychiatric/Behavioral: Negative.  Negative for dysphoric mood. The patient is not nervous/anxious.   All other systems reviewed and are negative.    Physical Exam  BP (!) 120/58 (BP Location: Left Arm, Cuff Size: Normal)   Pulse 63   Temp (!) 97.4 F (36.3 C) (Temporal)   Ht 5' 8.5" (1.74 m)   Wt 205 lb 9.6 oz (93.3 kg)   SpO2 93%   BMI 30.81 kg/m   Wt Readings from Last 5 Encounters:  06/30/20 205 lb 9.6 oz (93.3 kg)  06/15/20 (!) 206 lb (93.4 kg)  05/14/20 205 lb 3.2 oz (93.1 kg)  05/08/20 200 lb 14.4 oz (91.1 kg)  05/05/20 207 lb 9.6 oz (94.2 kg)    BMI Readings from Last 5 Encounters:  06/30/20 30.81 kg/m  06/15/20 29.56 kg/m  05/14/20 29.44 kg/m  05/08/20 28.83 kg/m  05/05/20 29.79 kg/m     Physical Exam Vitals and nursing note reviewed.  Constitutional:      General: He is not in acute distress.    Appearance: Normal appearance. He is normal weight.  HENT:     Head: Normocephalic and atraumatic.     Right Ear: Hearing and external ear normal.     Left Ear: Hearing and external ear normal.     Nose: Nose normal. No mucosal edema or rhinorrhea.     Right Turbinates: Not enlarged.     Left Turbinates: Not enlarged.     Mouth/Throat:     Mouth: Mucous  membranes  are dry.     Pharynx: Oropharynx is clear. No oropharyngeal exudate.  Eyes:     Pupils: Pupils are equal, round, and reactive to light.  Cardiovascular:     Rate and Rhythm: Normal rate and regular rhythm.     Pulses: Normal pulses.     Heart sounds: Normal heart sounds. No murmur heard.   Pulmonary:     Effort: Pulmonary effort is normal.     Breath sounds: Normal breath sounds. No decreased breath sounds, wheezing or rales.  Musculoskeletal:     Cervical back: Normal range of motion.     Right lower leg: No edema.     Left lower leg: No edema.  Lymphadenopathy:     Cervical: No cervical adenopathy.  Skin:    General: Skin is warm and dry.     Capillary Refill: Capillary refill takes less than 2 seconds.     Findings: No erythema or rash.  Neurological:     General: No focal deficit present.     Mental Status: He is alert and oriented to person, place, and time.     Motor: No weakness.     Coordination: Coordination normal.     Gait: Gait is intact. Gait normal.  Psychiatric:        Mood and Affect: Mood normal.        Behavior: Behavior normal. Behavior is cooperative.        Thought Content: Thought content normal.        Judgment: Judgment normal.       Assessment & Plan:   Afib (HCC) Plan: Continue Tikosyn Continue follow-up with cardiology  Tracheobronchomalacia This is likely compounding cream contributing to patient's shortness of breath  Plan: We will repeat CT of chest today  Sleep apnea Plan: Continue CPAP therapy  Shortness of breath Shortness of breath is significantly improved since getting adequate treatment of A. fib Restrictive pattern seen on pulmonary function testing as well as diffusion defect  Plan: Will obtain CT chest to compare to 2019 high-resolution CT chest Continue follow-up with cardiology     Return in about 3 months (around 09/30/2020), or if symptoms worsen or fail to improve, for Follow up with Dr.  Purnell Shoemaker, After Chest CT.    Lauraine Rinne, NP 06/30/2020   This appointment required 32 minutes of patient care (this includes precharting, chart review, review of results, face-to-face care, etc.).

## 2020-06-30 NOTE — Assessment & Plan Note (Signed)
Plan: Continue CPAP therapy 

## 2020-06-30 NOTE — Assessment & Plan Note (Signed)
This is likely compounding cream contributing to patient's shortness of breath  Plan: We will repeat CT of chest today

## 2020-06-30 NOTE — Assessment & Plan Note (Signed)
Shortness of breath is significantly improved since getting adequate treatment of A. fib Restrictive pattern seen on pulmonary function testing as well as diffusion defect  Plan: Will obtain CT chest to compare to 2019 high-resolution CT chest Continue follow-up with cardiology

## 2020-06-30 NOTE — Patient Instructions (Addendum)
You were seen today by Lauraine Rinne, NP  for:   1. Shortness of breath 2. Tracheobronchomalacia  - CT Chest Wo Contrast; Future  We will repeat a CT of your chest to further evaluate your breathing after reviewing your pulmonary function test  This will be a comparison to your 2019 CT chest  Continue to work on increasing your overall physical activity  3. Atrial fibrillation, unspecified type [facility])  Continue follow-up with cardiology  Continue medications as managed by cardiology  We recommend today:  Orders Placed This Encounter  Procedures  . CT Chest Wo Contrast    Standing Status:   Future    Standing Expiration Date:   06/30/2021    Scheduling Instructions:     Within next 6-8 weeks    Order Specific Question:   Preferred imaging location?    Answer:   [place]    Order Specific Question:   Radiology Contrast Protocol - do NOT remove file path    Answer:   [email or web address]   Orders Placed This Encounter  Procedures  . CT Chest Wo Contrast   No orders of the defined types were placed in this encounter.   Follow Up:    Return in about 3 months (around 09/30/2020), or if symptoms worsen or fail to improve, for Follow up with Dr. Purnell Shoemaker, After Chest CT.   Please do your part to reduce the spread of COVID-19:      Reduce your risk of any infection  and COVID19 by using the similar precautions used for avoiding the common cold or flu:  [name] Wash your hands often with soap and warm water for at least 20 seconds.  If soap and water are not readily available, use an alcohol-based hand sanitizer with at least 60% alcohol.  . If coughing or sneezing, cover your mouth and nose by coughing or sneezing into the elbow areas of your shirt or coat, into a tissue or into your sleeve (not your hands). Langley Gauss A MASK when in public  . Avoid shaking hands with others and consider head nods or verbal greetings only. . Avoid touching your  eyes, nose, or mouth with unwashed hands.  . Avoid close contact with people who are sick. . Avoid places or events with large numbers of people in one location, like concerts or sporting events. . If you have some symptoms but not all symptoms, continue to monitor at home and seek medical attention if your symptoms worsen. . If you are having a medical emergency, call 911.   [place] / e-Visit: [email or web address]         [facility]: [facility]: [phone]                   [facility]: [phone]     It is flu season:   >>> Best ways to protect herself from the flu: Receive the yearly flu vaccine, practice good hand hygiene washing with soap and also using hand sanitizer when available, eat a nutritious meals, get adequate rest, hydrate appropriately   Please contact the office if your symptoms worsen or you have concerns that you are not improving.   Thank you for choosing [facility] for your healthcare, and for allowing [place] to partner with you on your healthcare journey. I am thankful to be able to provide care to you today.  Wyn Quaker FNP-C

## 2020-07-01 ENCOUNTER — Encounter: Payer: Self-pay | Admitting: Physician Assistant

## 2020-07-01 ENCOUNTER — Ambulatory Visit (INDEPENDENT_AMBULATORY_CARE_PROVIDER_SITE_OTHER): Payer: Medicare Other | Admitting: Physician Assistant

## 2020-07-01 ENCOUNTER — Ambulatory Visit (INDEPENDENT_AMBULATORY_CARE_PROVIDER_SITE_OTHER): Payer: Medicare Other

## 2020-07-01 DIAGNOSIS — I2510 Atherosclerotic heart disease of native coronary artery without angina pectoris: Secondary | ICD-10-CM

## 2020-07-01 DIAGNOSIS — M545 Low back pain, unspecified: Secondary | ICD-10-CM

## 2020-07-01 MED ORDER — METHYLPREDNISOLONE 4 MG PO TABS: ORAL_TABLET | ORAL | 0 refills | Status: DC

## 2020-07-01 NOTE — Progress Notes (Signed)
Office Visit Note   Patient: Patrick Franco           Date of Birth: [birth year]           MRN: [id number] Visit Date: 07/01/2020              Requested by: Patrick Orn, MD [facility] [employer] [address],  [place] [address] PCP: Patrick Orn, MD   Assessment & Plan: Visit Diagnoses:  1. Acute right-sided low back pain without sciatica     Plan: We will place him on a Medrol Dosepak.  Also having continue to work on stretching exercises for his back.  Recommended moist heat to low back also.  See him back in 1 month at that time he will be a year out from right total hip arthroplasty will obtain AP pelvis and lateral view of the right hip.  He continues to have low back pain may consider epidural steroid injection.  Questions were encouraged and answered at length.  Follow-Up Instructions: Return in about 4 weeks (around 07/29/2020).   Orders:  Orders Placed This Encounter  Procedures  . XR Lumbar Spine 2-3 Views   Meds ordered this encounter  Medications  . methylPREDNISolone (MEDROL) 4 MG tablet    Sig: Take as directed    Dispense:  21 tablet    Refill:  0      Procedures: No procedures performed   Clinical Data: No additional findings.   Subjective: Chief Complaint  Patient presents with  . Lower Back - Pain    HPI Patrick Franco is well-known to Dr. Ninfa Linden service comes in today with right low back pain that comes and goes.  He is having no real radicular symptoms down the leg.  States this is been ongoing for the past 2 weeks.  He has known spinal stenosis.  Recently been in and out of the hospital due to A. fib.  He denies any saddle anesthesia like symptoms, waking pain or bowel or bladder dysfunction.  Tried Tylenol for the pain which gives him some relief. Review of Systems See HPI otherwise negative or noncontributory.  Objective: Vital Signs: There were no vitals taken for this visit.  Physical Exam General: Well-developed well-nourished male in no  acute distress mood and affect appropriate. Psych: Alert and oriented x3 Ortho Exam Lower extremities 5 out of 5 strengths except for dorsiflexion of the right foot against resistance with shoes 4-5 strength.  Walks with an antalgic gait and valgus knees without the use of assistive device.  Negative straight leg raise bilaterally.  Good range of motion bilateral hips without pain. Specialty Comments:  No specialty comments available.  Imaging: XR Lumbar Spine 2-3 Views  Result Date: 07/01/2020 AP lateral views lumbar spine: No acute fractures.  Loss of lordotic curvature.  Levoscoliosis remains unchanged from previous films.  Severe facet arthritic changes throughout lumbar spine.  Degenerative changes throughout lumbar spine.    PMFS History: Patient Active Problem List   Diagnosis Date Noted  . A-fib ([place]) 05/05/2020  . Afib ([place]) 05/05/2020  . Acute diastolic CHF (congestive heart failure) ([place]) 03/08/2020  . CAD (coronary artery disease) 03/08/2020  . Aortic valve disease 03/08/2020  . Atypical atrial flutter ([place]) 03/05/2020  . Aortic stenosis 02/19/2020  . Mitral valve prolapse 02/19/2020  . Shortness of breath 02/19/2020  . Tracheobronchomalacia 02/19/2020  . Pulmonary air trapping 02/19/2020  . Peripheral neuropathy 10/04/2019  . Status post total replacement of right hip 08/13/2019  .  Unilateral primary osteoarthritis, right hip 06/26/2019  . Arthritis of right hip 06/18/2019  . Right hip pain 06/12/2019  . Greater trochanteric bursitis of right hip 05/22/2019  . Depression 02/21/2017  . Degenerative arthritis of left knee 09/16/2016  . Left knee pain 06/02/2016  . Gout 04/12/2016  . Obesity 02/09/2016  . Spinal stenosis, lumbar region, with neurogenic claudication 02/09/2016  . Muscle fatigue 09/21/2015  . Gastric ulcer with hemorrhage 08/19/2015  . Hypertension   . Sleep apnea   . Chronic low back pain   . BPH (benign prostatic hyperplasia)   . Gait disorder  02/03/2015  . Foot drop, right 02/03/2015   Past Medical History:  Diagnosis Date  . Aortic stenosis   . Arthritis   . Atrial fibrillation ([place])   . BPH (benign prostatic hyperplasia)   . Chronic low back pain   . Depression   . Diverticulitis   . Elevated PSA   . Foot drop, right   . Gait disorder 02/03/2015  . Hip pain   . History of colonoscopy   . HOH (hard of hearing)    hearing aid  . Hypertension   . Lumbar radiculopathy   . Mitral valve prolapse   . Onychomycosis   . Peptic ulcer disease   . Sepsis ([place]) 2005   e. coli-after prostate bx  . Sleep apnea   . Torn ACL   . Tracheobronchomalacia    seen on 2019 Chest CT    Family History  Problem Relation Age of Onset  . Pneumonia Mother 68  . Cancer - Lung Father 61  . Multiple myeloma Brother 53    Past Surgical History:  Procedure Laterality Date  . CARDIOVERSION N/A 03/27/2020   Procedure: CARDIOVERSION;  Surgeon: Skeet Latch, MD;  Location: [place];  Service: Cardiovascular;  Laterality: N/A;  . CATARACT EXTRACTION Bilateral   . ESOPHAGOGASTRODUODENOSCOPY N/A 08/19/2015   Procedure: ESOPHAGOGASTRODUODENOSCOPY (EGD);  Surgeon: Laurence Spates, MD;  Location: [facility] ENDOSCOPY;  Service: Endoscopy;  Laterality: N/A;  . right knee replacement Right 1996  . SHOULDER SURGERY    . TEE WITHOUT CARDIOVERSION N/A 03/27/2020   Procedure: TRANSESOPHAGEAL ECHOCARDIOGRAM (TEE);  Surgeon: Skeet Latch, MD;  Location: [place];  Service: Cardiovascular;  Laterality: N/A;  . TONSILLECTOMY    . TOTAL HIP ARTHROPLASTY Right 08/13/2019   Procedure: RIGHT TOTAL HIP ARTHROPLASTY ANTERIOR APPROACH;  Surgeon: Mcarthur Rossetti, MD;  Location: [place];  Service: Orthopedics;  Laterality: Right;   Social History   Occupational History  . Occupation: Retired Social research officer, government  Tobacco Use  . Smoking status: Former Smoker    Packs/day: 0.50    Types: Cigarettes    Start date: 1960    Quit date: 1961    Years  since quitting: 60.6  . Smokeless tobacco: Never Used  . Tobacco comment: Pt smoked for 67month when in [employer]  Vaping Use  . Vaping Use: Never used  Substance and Sexual Activity  . Alcohol use: Not on file  . Drug use: No  . Sexual activity: Yes

## 2020-07-09 ENCOUNTER — Other Ambulatory Visit (HOSPITAL_COMMUNITY): Payer: Medicare Other

## 2020-07-13 ENCOUNTER — Other Ambulatory Visit: Payer: Self-pay | Admitting: Family Medicine

## 2020-07-16 DIAGNOSIS — R351 Nocturia: Secondary | ICD-10-CM | POA: Diagnosis not present

## 2020-07-16 DIAGNOSIS — N401 Enlarged prostate with lower urinary tract symptoms: Secondary | ICD-10-CM | POA: Diagnosis not present

## 2020-07-16 DIAGNOSIS — N402 Nodular prostate without lower urinary tract symptoms: Secondary | ICD-10-CM | POA: Diagnosis not present

## 2020-07-18 DIAGNOSIS — Z20822 Contact with and (suspected) exposure to covid-19: Secondary | ICD-10-CM | POA: Diagnosis not present

## 2020-07-18 DIAGNOSIS — Z1152 Encounter for screening for COVID-19: Secondary | ICD-10-CM | POA: Diagnosis not present

## 2020-07-18 DIAGNOSIS — Z9229 Personal history of other drug therapy: Secondary | ICD-10-CM | POA: Diagnosis not present

## 2020-07-29 ENCOUNTER — Ambulatory Visit (INDEPENDENT_AMBULATORY_CARE_PROVIDER_SITE_OTHER): Payer: Medicare Other | Admitting: Orthopaedic Surgery

## 2020-07-29 ENCOUNTER — Encounter: Payer: Self-pay | Admitting: Orthopaedic Surgery

## 2020-07-29 ENCOUNTER — Ambulatory Visit (INDEPENDENT_AMBULATORY_CARE_PROVIDER_SITE_OTHER): Payer: Medicare Other

## 2020-07-29 ENCOUNTER — Other Ambulatory Visit: Payer: Self-pay | Admitting: Family Medicine

## 2020-07-29 DIAGNOSIS — I2510 Atherosclerotic heart disease of native coronary artery without angina pectoris: Secondary | ICD-10-CM | POA: Diagnosis not present

## 2020-07-29 DIAGNOSIS — Z96641 Presence of right artificial hip joint: Secondary | ICD-10-CM

## 2020-07-29 NOTE — Progress Notes (Signed)
The patient is now almost a year out from a right total hip arthroplasty.  He states of the right hip is doing well and he has no issues.  He is 86 years old.  He does have known significant degenerative scoliosis of his lumbar spine.  At his last visit he was started on a steroid taper and he said he is doing much better with his back.  He does not feel like he needs another intervention as it relates to any epidural steroid injection.  He has had epidurals remotely in the past.  He is able to put his shoes and socks on easily.  Both hips move fluidly and smoothly when I put him through internal ex rotation and feel equal.  [place] pelvis and lateral the right hip shows a well-seated total hip arthroplasty with no complicating features.  At this point we had a long thorough discussion about his back and his hips.  He is doing well at this point but follow-up can be as needed.  If things worsen for him in any way I would recommend outpatient physical therapy for his back and considering an epidural steroid injection.  He knows if there is any issues as a relates to his hip not to hesitate to come see [place].

## 2020-08-11 ENCOUNTER — Ambulatory Visit (HOSPITAL_COMMUNITY)
Admission: RE | Admit: 2020-08-11 | Discharge: 2020-08-11 | Disposition: A | Discharge status: Home / Self Care | Payer: Medicare Other | Source: Ambulatory Visit | Attending: Pulmonary Disease | Admitting: Pulmonary Disease

## 2020-08-11 ENCOUNTER — Other Ambulatory Visit: Payer: Self-pay

## 2020-08-11 ENCOUNTER — Encounter (HOSPITAL_COMMUNITY): Payer: Self-pay

## 2020-08-11 DIAGNOSIS — I700 Atherosclerosis of aorta: Secondary | ICD-10-CM | POA: Diagnosis not present

## 2020-08-11 DIAGNOSIS — R0600 Dyspnea, unspecified: Secondary | ICD-10-CM | POA: Diagnosis not present

## 2020-08-11 DIAGNOSIS — J984 Other disorders of lung: Secondary | ICD-10-CM | POA: Diagnosis not present

## 2020-08-11 DIAGNOSIS — R0602 Shortness of breath: Secondary | ICD-10-CM | POA: Insufficient documentation

## 2020-08-11 DIAGNOSIS — I2510 Atherosclerotic heart disease of native coronary artery without angina pectoris: Secondary | ICD-10-CM | POA: Diagnosis not present

## 2020-08-12 ENCOUNTER — Ambulatory Visit: Payer: Medicare Other | Admitting: Cardiovascular Disease

## 2020-08-13 ENCOUNTER — Telehealth: Payer: Self-pay | Admitting: *Deleted

## 2020-08-13 NOTE — Telephone Encounter (Signed)
1 year Ortho bundle call completed. ?

## 2020-08-17 ENCOUNTER — Other Ambulatory Visit (HOSPITAL_COMMUNITY): Payer: Self-pay | Admitting: Nurse Practitioner

## 2020-09-07 ENCOUNTER — Other Ambulatory Visit: Payer: Self-pay | Admitting: Student

## 2020-09-07 NOTE — Telephone Encounter (Signed)
Age 86, 93kg, SCr 0.99 on 05/14/20 Aflutter indication, last visit Aug 2021

## 2020-09-16 ENCOUNTER — Encounter: Payer: Self-pay | Admitting: Internal Medicine

## 2020-09-16 ENCOUNTER — Other Ambulatory Visit: Payer: Self-pay

## 2020-09-16 ENCOUNTER — Ambulatory Visit (INDEPENDENT_AMBULATORY_CARE_PROVIDER_SITE_OTHER): Payer: Medicare Other | Admitting: Internal Medicine

## 2020-09-16 VITALS — BP 126/70 | HR 65 | Ht 69.0 in | Wt 207.6 lb

## 2020-09-16 DIAGNOSIS — I5032 Chronic diastolic (congestive) heart failure: Secondary | ICD-10-CM | POA: Diagnosis not present

## 2020-09-16 DIAGNOSIS — I4819 Other persistent atrial fibrillation: Secondary | ICD-10-CM

## 2020-09-16 DIAGNOSIS — I2510 Atherosclerotic heart disease of native coronary artery without angina pectoris: Secondary | ICD-10-CM

## 2020-09-16 NOTE — Progress Notes (Signed)
PCP: Lavone Orn, MD   Primary EP: Dr Rayann Heman  Patrick Franco is a 86 y.o. male who presents today for routine electrophysiology followup.  Since starting tikosyn, the patient reports doing very well. His exercise tolerance is much improved.  SOB resolved.  He feels that he is staying in sinus rhythm.  He has multiple musculoskeletal complaints today. Today, he denies symptoms of palpitations, chest pain, shortness of breath,  lower extremity edema, dizziness, presyncope, or syncope.  The patient is otherwise without complaint today.   Past Medical History:  Diagnosis Date  . Aortic stenosis   . Arthritis   . Atrial fibrillation ([place])   . BPH (benign prostatic hyperplasia)   . Chronic low back pain   . Depression   . Diverticulitis   . Elevated PSA   . Foot drop, right   . Gait disorder 02/03/2015  . Hip pain   . History of colonoscopy   . HOH (hard of hearing)    hearing aid  . Hypertension   . Lumbar radiculopathy   . Mitral valve prolapse   . Onychomycosis   . Peptic ulcer disease   . Sepsis ([place]) 2005   e. coli-after prostate bx  . Sleep apnea   . Torn ACL   . Tracheobronchomalacia    seen on 2019 Chest CT   Past Surgical History:  Procedure Laterality Date  . CARDIOVERSION N/A 03/27/2020   Procedure: CARDIOVERSION;  Surgeon: Skeet Latch, MD;  Location: [place];  Service: Cardiovascular;  Laterality: N/A;  . CATARACT EXTRACTION Bilateral   . ESOPHAGOGASTRODUODENOSCOPY N/A 08/19/2015   Procedure: ESOPHAGOGASTRODUODENOSCOPY (EGD);  Surgeon: Laurence Spates, MD;  Location[facility] END[facility]COPY;  Service: Endoscopy;  Laterality: N/A;  . right knee replacement Right 1996  . SHOULDER SURGERY    . TEE WITHOUT CARDIOVERSION N/A 03/27/2020   Procedure: TRANSESOPHAGEAL ECHOCARDIOGRAM (TEE);  Surgeon: Skeet Latch, MD;  Location: [place];  Service: Cardiovascular;  Laterality: N/A;  . TONSILLECTOMY    . TOTAL HIP ARTHROPLASTY Right 08/13/2019   Procedure: RIGHT TOTAL  HIP ARTHROPLASTY ANTERIOR APPROACH;  Surgeon: Mcarthur Rossetti, MD;  Location: [place];  Service: Orthopedics;  Laterality: Right;    ROS- all systems are reviewed and negatives except as per HPI above  Current Outpatient Medications  Medication Sig Dispense Refill  . allopurinol (ZYLOPRIM) 100 MG tablet TAKE 1 TABLET ONCE DAILY. 90 tablet 0  . atorvastatin (LIPITOR) 10 MG tablet Take 10 mg by mouth daily.   10  . diltiazem (CARDIZEM CD) 240 MG 24 hr capsule TAKE (1) CAPSULE DAILY. 30 capsule 6  . dofetilide (TIKOSYN) 500 MCG capsule Take 1 capsule (500 mcg total) by mouth 2 (two) times daily. 60 capsule 6  . dutasteride (AVODART) 0.5 MG capsule Take 0.5 mg by mouth daily.  2  . ELIQUIS 5 MG TABS tablet TAKE 1 TABLET BY MOUTH TWICE DAILY. 60 tablet 5  . furosemide (LASIX) 40 MG tablet Taking 1.5 tablets by mouth daily    . methylPREDNISolone (MEDROL) 4 MG tablet Take as directed 21 tablet 0  . metoprolol tartrate (LOPRESSOR) 25 MG tablet Take 25 mg by mouth daily.    . mirtazapine (REMERON) 30 MG tablet TAKE ONE TABLET AT BEDTIME. 90 tablet 0  . Multiple Vitamin (MULTIVITAMIN) tablet Take 1 tablet by mouth daily.    . Omega-3 Fatty Acids (FISH OIL PO) Take 1,200 mg by mouth daily.     Vladimir Faster Glycol-Propyl Glycol (SYSTANE) 0.4-0.3 % SOLN Place 1  drop into both eyes in the morning, at noon, and at bedtime.     . potassium chloride (KLOR-CON) 20 MEQ tablet Take 1 tablet (20 mEq total) by mouth 2 (two) times daily. 60 tablet 6  . Probiotic Product (PROBIOTIC DAILY PO) Take 1 tablet by mouth daily.    [name] pyridOXINE (VITAMIN B-6) 100 MG tablet Take 100 mg by mouth daily.    [name] Respiratory Therapy Supplies (FLUTTER) DEVI Use the flutter valve 10 times when doing it daily. 1 each 0  . tamsulosin (FLOMAX) 0.4 MG CAPS capsule Take 0.4 mg by mouth daily.  2  . telmisartan (MICARDIS) 20 MG tablet Take 20 mg by mouth daily.    . Turmeric 500 MG CAPS Take 500 mg by mouth daily.    . vitamin B-12  (CYANOCOBALAMIN) 1000 MCG tablet Take 1,000 mcg by mouth daily.    . Vitamin D, Ergocalciferol, (DRISDOL) 1.25 MG (50000 UNIT) CAPS capsule TAKE 1 CAPSULE WEEKLY. 12 capsule 0   No current facility-administered medications for this visit.    Physical Exam: Vitals:   09/16/20 1050  BP: 126/70  Pulse: 65  SpO2: 97%  Weight: 207 lb 9.6 oz (94.2 kg)  Height: 5\' 9"  (1.753 m)    GEN- The patient is well appearing, alert and oriented x 3 today.   Head- normocephalic, atraumatic Eyes-  Sclera clear, conjunctiva pink Ears- hearing intact Oropharynx- clear Lungs- Clear to ausculation bilaterally, normal work of breathing Heart- Regular rate and rhythm, no murmurs, rubs or gallops, PMI not laterally displaced GI- soft, NT, ND, + BS Extremities- no clubbing, cyanosis, or edema  Wt Readings from Last 3 Encounters:  09/16/20 207 lb 9.6 oz (94.2 kg)  06/30/20 205 lb 9.6 oz (93.3 kg)  06/15/20 (!) 206 lb (93.4 kg)    EKG tracing ordered today is personally reviewed and shows sinus rhythm, stable qt  Assessment and Plan:  1. Persistent afib Maintaining sinus with tikosyn Bmet, mg today We will need to follow closely to avoid toxicity with this medicine. [email or web address] score is at least 5.  Continue eliquis  2. Chronic diastolic dysfunction Stable No change required today  3. HL Continue lipitor  38months AF clinic I will see in 6 months  Risks, benefits and potential toxicities for medications prescribed and/or refilled reviewed with patient today.   Thompson Grayer MD, [facility] 09/16/2020 11:16 AM

## 2020-09-16 NOTE — Patient Instructions (Addendum)
Medication Instructions:  Your physician recommends that you continue on your current medications as directed. Please refer to the Current Medication list given to you today.  *If you need a refill on your cardiac medications before your next appointment, please call your pharmacy*  Lab Work: [facility]  If you have labs (blood work) drawn today and your tests are completely normal, you will receive your results only by: [name] MyChart Message (if you have MyChart) OR . A paper copy in the mail If you have any lab test that is abnormal or we need to change your treatment, we will call you to review the results.  Testing/Procedures: None ordered.  Follow-Up: At [facility], you and your health needs are our priority.  As part of our continuing mission to provide you with exceptional heart care, we have created designated Provider Care Teams.  These Care Teams include your primary Cardiologist (physician) and Advanced Practice Providers (APPs -  Physician Assistants and Nurse Practitioners) who all work together to provide you with the care you need, when you need it.  We recommend signing up for the patient portal called "MyChart".  Sign up information is provided on this After Visit Summary.  MyChart is used to connect with patients for Virtual Visits (Telemedicine).  Patients are able to view lab/test results, encounter notes, upcoming appointments, etc.  Non-urgent messages can be sent to your provider as well.   To learn more about what you can do with MyChart, go to [email or web address].    Your next appointment:   Your physician wants you to follow-up in:  3 months with the afib clinic they will reach out to you to schedule.   Other Instructions:

## 2020-09-17 LAB — BASIC METABOLIC PANEL
BUN/Creatinine Ratio: 24 (ref 10–24)
BUN: 24 mg/dL (ref 8–27)
CO2: 27 mmol/L (ref 20–29)
Calcium: 10.2 mg/dL (ref 8.6–10.2)
Chloride: 103 mmol/L (ref 96–106)
Creatinine, Ser: 1 mg/dL (ref 0.76–1.27)
GFR calc Af Amer: 79 mL/min/{1.73_m2} (ref >59)
GFR calc non Af Amer: 68 mL/min/{1.73_m2} (ref >59)
Glucose: 108 mg/dL — ABNORMAL HIGH (ref 65–99)
Potassium: 4.5 mmol/L (ref 3.5–5.2)
Sodium: 142 mmol/L (ref 134–144)

## 2020-09-17 LAB — MAGNESIUM: Magnesium: 2.1 mg/dL (ref 1.6–2.3)

## 2020-09-21 ENCOUNTER — Other Ambulatory Visit: Payer: Self-pay | Admitting: Family Medicine

## 2020-09-29 ENCOUNTER — Telehealth: Payer: Self-pay | Admitting: Orthopaedic Surgery

## 2020-09-29 NOTE — Telephone Encounter (Signed)
Patient aware Rx was sent to [facility]

## 2020-09-29 NOTE — Telephone Encounter (Signed)
That will be fine. 

## 2020-09-29 NOTE — Telephone Encounter (Signed)
Patient called requesting Dr. Ninfa Linden send a referral to [facility] for patient's back pains. Patient stated he would like to try therapy to see if it helps with back since he has had hip replacement. Please call patient at [phone].

## 2020-10-02 DIAGNOSIS — K219 Gastro-esophageal reflux disease without esophagitis: Secondary | ICD-10-CM | POA: Diagnosis not present

## 2020-10-02 DIAGNOSIS — I10 Essential (primary) hypertension: Secondary | ICD-10-CM | POA: Diagnosis not present

## 2020-10-02 DIAGNOSIS — R269 Unspecified abnormalities of gait and mobility: Secondary | ICD-10-CM | POA: Diagnosis not present

## 2020-10-02 DIAGNOSIS — M48062 Spinal stenosis, lumbar region with neurogenic claudication: Secondary | ICD-10-CM | POA: Diagnosis not present

## 2020-10-02 DIAGNOSIS — I480 Paroxysmal atrial fibrillation: Secondary | ICD-10-CM | POA: Diagnosis not present

## 2020-10-02 DIAGNOSIS — M109 Gout, unspecified: Secondary | ICD-10-CM | POA: Diagnosis not present

## 2020-10-05 ENCOUNTER — Other Ambulatory Visit: Payer: Self-pay | Admitting: Family Medicine

## 2020-10-05 DIAGNOSIS — D1801 Hemangioma of skin and subcutaneous tissue: Secondary | ICD-10-CM | POA: Diagnosis not present

## 2020-10-05 DIAGNOSIS — Z85828 Personal history of other malignant neoplasm of skin: Secondary | ICD-10-CM | POA: Diagnosis not present

## 2020-10-05 DIAGNOSIS — L570 Actinic keratosis: Secondary | ICD-10-CM | POA: Diagnosis not present

## 2020-10-05 DIAGNOSIS — C44222 Squamous cell carcinoma of skin of right ear and external auricular canal: Secondary | ICD-10-CM | POA: Diagnosis not present

## 2020-10-05 DIAGNOSIS — L821 Other seborrheic keratosis: Secondary | ICD-10-CM | POA: Diagnosis not present

## 2020-10-05 DIAGNOSIS — L853 Xerosis cutis: Secondary | ICD-10-CM | POA: Diagnosis not present

## 2020-10-05 DIAGNOSIS — D485 Neoplasm of uncertain behavior of skin: Secondary | ICD-10-CM | POA: Diagnosis not present

## 2020-10-05 DIAGNOSIS — D225 Melanocytic nevi of trunk: Secondary | ICD-10-CM | POA: Diagnosis not present

## 2020-10-06 ENCOUNTER — Ambulatory Visit (INDEPENDENT_AMBULATORY_CARE_PROVIDER_SITE_OTHER): Payer: Medicare Other | Admitting: Internal Medicine

## 2020-10-06 ENCOUNTER — Other Ambulatory Visit: Payer: Self-pay

## 2020-10-06 ENCOUNTER — Encounter: Payer: Self-pay | Admitting: Internal Medicine

## 2020-10-06 VITALS — BP 128/80 | HR 67 | Temp 97.3°F | Ht 69.5 in | Wt 209.7 lb

## 2020-10-06 DIAGNOSIS — J398 Other specified diseases of upper respiratory tract: Secondary | ICD-10-CM

## 2020-10-06 DIAGNOSIS — R0602 Shortness of breath: Secondary | ICD-10-CM | POA: Diagnosis not present

## 2020-10-06 DIAGNOSIS — I2510 Atherosclerotic heart disease of native coronary artery without angina pectoris: Secondary | ICD-10-CM | POA: Diagnosis not present

## 2020-10-06 DIAGNOSIS — M545 Low back pain, unspecified: Secondary | ICD-10-CM | POA: Diagnosis not present

## 2020-10-06 DIAGNOSIS — M25551 Pain in right hip: Secondary | ICD-10-CM | POA: Diagnosis not present

## 2020-10-06 NOTE — Progress Notes (Signed)
PCP Lavone Orn, MD   IOV 12/08/2017  Chief Complaint  Patient presents with  . Advice Only    Referred by Dr. Laurann Montana due to SOB and an abn pft. Pt states SOB has been happening x3 years ago which has become worse.    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  He was president of a Human resources officer.  He has chronic thoracic dextroscoliosis.  He is here for insidious onset of shortness of breath.  He tells me that he once a month at home and [place].  The home is at 10,000 feet.  Ever since he turned 86 years old when he is at this home where he spends the summer for 2 months he is always hypoxemic and short of breath climbing a flight of stairs.  He is having to use oxygen quite a bit there.  He does not have a problem at night because he uses a CPAP.  He says he never gets acclimate highest.  He is able to handle 7000 feet pretty well when he was fly fishing but when he gets to his home at 10,000 feet he has a problem.  He thinks the severity of this problem is getting worse slowly.  In addition he is also plays doubles tennis and he is getting more more short of breath in the last 3 years.  However for day-to-day exertion he does not have a problem.  He goes for daily walks but this is compounded by the fact he has back pain from his chronic dextroscoliosis in the thoracic region.  When he gets an epidural shot he is able to walk more   Previous imaging personally visualized.  Chest x-ray August 08, 2017 shows profound thoracic scoliosis.  This is also seen in a CT abdomen lung cut from 2017 that is associated with left-sided basal atelectasis.  In that lung cut I do not see any evidence of ILD.   He did not have ischemia on a cardiac stress test the same month. Echocardiogram May 2018: Ejection fraction 65% with grade 1 diastolic dysfunction mild aortic stenosis.   Last hemoglobin check was in October 2016 and he was anemic at the time 9.8 g%. -According to  the patient this happened when he had GI bleed.  He says since then this is resolved.  He believes his hemoglobin is being rechecked by his primary care physician and this should be normal because he is not aware that he is anemic anymore.  He says he will have his this rechecked by his primary care physician in the coming weeks.  Walking desat in office: did not desat 185 feet x 3 laps  OV 01/25/2018  Chief Complaint  Patient presents with  . Follow-up    HRCT done 12/27/17  Pt states he is still becoming SOB but then after he sits down to rest, he will be able to do other activities again. Denies  any complaints of cough or CP.    Here with wife to discuss below. Wife reports that albuterol during spirometry helped dyspnea. No new issues. REports being on CPAP for OSA. Does exercises such as yoga and pilates. Plays doubles tennis without moving due to dyspnea.    1. Tracheobronchomalacia. CT chest 12/27/17 2. Moderate patchy air trapping in both lungs, indicative of small airways disease. 3. Scattered mild cylindrical and varicoid bronchiectasis in both lungs. Areas of postinfectious/postinflammatory scarring in the mid to lower lungs bilaterally. Otherwise no evidence of interstitial  lung disease.-> #1, #2 and #3 can explain dyspnea 4. Borderline mild cardiomegaly. Dilated main pulmonary artery, suggesting pulmonary arterial hypertension. 5. Left main and 3 vessel coronary atherosclerosis.- not to worry already had normal stress test 6. Inferior left thyroid lobe 4.6 cm nodule, for which thyroid ultrasound correlation is warranted if not previously performed.- needs to talk to pcp 7. Diffuse hepatic steatosis.- talk to pcp 8. Stable right adrenal adenoma.  Aortic Atherosclerosis (ICD10-I70.0).    OV 03/15/2018  Chief Complaint  Patient presents with  . Follow-up    thyroid was biopsied yesterday, 5/1.  Pt is having a sonogram on heart in a couple weeks.  Still having some mild  SOB. Denies any cough or CP. Pt started on Lipitor about 2 weeks ago by PCP and will occ become dizzy.     Follow-up dyspnea due to visceral obesity, tracheobronchomalacia, pulmonary air trapping and bronchomalacia without complication  Last visit we started incentive spirometry, encourage CPAP compliance and also flutter valve use during the day.  We also started inhaler Stiolto.  The combination of these measures have helped him somewhat.  He particularly gets significant benefit from the devices but not as much from the inhaler.  Nevertheless he wants to continue the inhaler.  We gave him a sample and he ran out of the sample approximately 1 week ago.  Despite not  taking inhaler he does not notice much worsening.  Nevertheless he wants to continue with inhaler.  He is worried about his coronary artery calcification.  I gave him the results at last visit but he does not remember this.  Nevertheless his primary care physician has ordered an echo and refer him to cardiology.  He did have a normal stress test in May 2018.  I shared all these results with him.    OV 09/12/2018  Subjective:  Patient ID: Patrick Franco, male , DOB: [birth year] , age 86 y.o. , MRN: [id number] , ADDRESS: [address] [place] [address]   09/12/2018 -   Chief Complaint  Patient presents with  . Follow-up    worsened SOB. unable to perform activities     HPI Patrick Franco 86 y.o. - bronchomalacia with multofactorial dyspnea -  visceral obesity, tracheobronchomalacia, pulmonary air trapping and bronchomalacia without complication   Returns for follow-up.  Since his last visit he spends 2 months in [place], [place].  He says he is extremely dyspneic.  It was an elevation of 10,000 feet.  He makes annual trips because he owns a home there.  He said he cannot do anything and is very dyspneic.  His pulse ox was in the low 80s.  He has not sold his home there and return.  He says he is gained a lot of weight in  the last year.  His current weight is 212 pounds.  He understands that he needs to lose weight.  While in [place] he did not use his Stiolto.  Since then he is restarted.  Nevertheless even if he plays 15 minutes of tennis he is extremely short of breath.  There is no wheezing or cough.  We [facility] referral for the tracheal bronchomalacia but he wants to hold off.  He first wants to try losing weight.[name]  He is also bothered by lumbar stenosis.  He continues with yoga and Pilates.  He is up-to-date with his flu shot.     ROS - per HPI   OV 12/14/2018  Subjective:  Patient ID:  Patrick Franco, male , DOB: 1932/07/11 , age 19 y.o. , MRN: [id number] , ADDRESS: [address] [place] [address]   12/14/2018 -   Chief Complaint  Patient presents with  . Follow-up    Pt states he feels healthier than before and has been working out more.    Bronchomalacia with multofactorial dyspnea -  visceral obesity, tracheobronchomalacia, pulmonary air trapping and bronchomalacia without complication  HPI Patrick Franco 86 y.o. -returns for follow-up of the above issues.  In the interim he has sold his house in [place].  This is because of shortness of breath.  He is also stopped playing tennis because of balance issues.  He has lost weight from 212 pounds to 205 pounds.  He is doing yoga and Pilates and working with a trainer for balance issues.  With all this his shortness of breath is better but then he also states that he has not been exerting himself as much such as going into the mountains and playing tennis.  Therefore he has limited himself.  Nevertheless he is feeling better because of the weight loss.  There has been no respiratory exacerbations.  He is taking his Stiolto 3 times a week.    06/30/2020  - Visit    86 year old male former smoker followed in our office for tracheobronchomalacia, obstructive sleep apnea, and dyspnea on exertion  PMH: Hypertension, BPH, obesity,  depression, nonrheumatic aortic stenosis, aortic stenosis, PACs, CAD Smoker/ Smoking History: Former smoker.  Limited smoking history.  Quit 1961 Maintenance:  none Pt of: None Dr. Chase Caller   86 year old male former smoker followed in our office for at last office visit patient had a pulmonary function testing ordered.  He also had lab work as well as a trial of [employer].  He was requested to have follow-up in 4 weeks with Dr. Chase Caller.  This was never completed.  03/04/2020-pulmonary function test-FVC 2.14 (58% predicted), postbronchodilator ratio 76, postbronchodilator FEV1 1.60 (63% predicted), no bronchodilator response, mid flow reversibility, TLC 2.73 (39% predicted), DLCO 15.16 (65% predicted)  After last office visit patient has been established with cardiology who has been working with the patient for A. fib/a flutter management.  Patient reporting today that symptoms are much better manage since starting Tikosyn.  He feels that his shortness of breath has improved significantly.  He is able to do Pilates 1 time a week, going to a personal trainer 1 time a week, exercising on a recumbent bike for 30 minutes at a time as well as doing walking inside of a pool for about 30 minutes.  This is a significant improvement from where he was in April/2021.  Patient did not notice significant improvement with Breo Ellipta.  He is no longer taking this inhaler.  Questionaires / Pulmonary Flowsheets:   ACT:  No flowsheet data found.  MMRC: mMRC Dyspnea Scale mMRC Score  06/30/2020 1  02/19/2020 3    Epworth:  No flowsheet data found.  Tests:   12/27/2017-CT chest high resolution-tracheobronchial malacia, moderate patchy air trapping in both lungs indicative of small airways disease, scattered mild cylindrical varicoid bronchiectasis in both lungs, no other evidence of interstitial lung disease, borderline mild cardiomegaly, dilated main pulmonary artery suggestive of pulmonary arterial  hypertension, left main three-vessel coronary artery disease  06/12/2019-echocardiogram-LV ejection fraction greater than 65%, right ventricle is normal systolic function, left atrial size moderately dilated, right atrial size mildly dilated, moderate mitral valve prolapse, mitral valve is abnormal   OV  10/06/2020  Subjective:  Patient ID: Patrick Franco, male , DOB: 1932-02-17 , age 26 y.o. , MRN: [id number] , ADDRESS: [address] [place] [address] PCP Lavone Orn, MD Patient Care Team: Lavone Orn, MD as PCP - General (Internal Medicine) Burnell Blanks, MD as PCP - Cardiology (Cardiology) Thompson Grayer, MD as PCP - Electrophysiology (Cardiology) Mcarthur Rossetti, MD as Consulting Physician (Orthopedic Surgery)  This Provider for this visit: Treatment Team:  Attending Provider: Brand Males, MD    10/06/2020 -   Chief Complaint  Patient presents with  . Follow-up    SOB     HPI Patrick Franco 86 y.o. - returns for follow-up. Not seen since Jan 2020.  In the interim he saw Wyn Quaker the nurse practitioner.  He was given a diagnosis of atrial fibrillation diagnosed by Wyn Quaker.  Since then he has been had admissions.  He is on Tikosyn he said cardioversion.  He says he is maintained in sinus rhythm this helped the dyspnea.  Last year he also had a right hip replacement but after that his hip pain is back.  He says orthopedic is saying it is not his new joint that is causing problems.  Nevertheless it is limiting his mobility.  His mobility so restricted that he is not perceiving dyspnea anymore.  He is not on any inhalers.  He had another CT scan of the chest to assess for tracheobronchomalacia.  The results are clear lung fields.  However no comment is made on tracheobronchomalacia.  The report is silent for this.  He did want me to inquire about this.  He is up-to-date with his vaccines.  He is given up tennis because of all his medical issues.  He  does do Pilates and work with a Clinical research associate.     PFT Results Latest Ref Rng & Units 03/04/2020  FVC-Pre L 2.14  FVC-Predicted Pre % 58  FVC-Post L 2.10  FVC-Predicted Post % 57  Pre FEV1/FVC % % 69  Post FEV1/FCV % % 76  FEV1-Pre L 1.48  FEV1-Predicted Pre % 58  FEV1-Post L 1.60  DLCO uncorrected ml/min/mmHg 15.16  DLCO UNC% % 65  DLCO corrected ml/min/mmHg 15.16  DLCO COR %Predicted % 65  DLVA Predicted % 101     CLINICAL DATA:  Dyspnea.  EXAM: CT CHEST WITHOUT CONTRAST  TECHNIQUE: Multidetector CT imaging of the chest was performed following the standard protocol without IV contrast.  COMPARISON:  December 27, 2017.  FINDINGS: Cardiovascular: Atherosclerosis of thoracic aorta is noted without aneurysm formation. Coronary artery calcifications are noted. Mild cardiomegaly is noted. No pericardial effusion is noted.  Mediastinum/Nodes: Stable 4.3 cm nodule seen arising inferiorly from the left thyroid lobe. No adenopathy is noted. The esophagus is unremarkable.  Lungs/Pleura: No pneumothorax or pleural effusion is noted. Stable bibasilar scarring is noted. No definite acute abnormality is noted.  Upper Abdomen: No acute abnormality.  Musculoskeletal: No chest wall mass or suspicious bone lesions identified.  IMPRESSION: 1. Coronary artery calcifications are noted suggesting coronary artery disease. 2. Stable 4.3 cm nodule seen arising inferiorly from left thyroid lobe. This has been previously biopsied and no further follow-up is required. (Ref: J Am Coll Radiol. 2015 Feb;12(2): 143-50). 3. Aortic atherosclerosis.  Aortic Atherosclerosis (ICD10-I70.0).   Electronically Signed   By: Marijo Conception M.D.   On: 08/13/2020 09:00 ROS - per HPI     has a past medical history of Aortic stenosis, Arthritis, Atrial fibrillation (  [place]), BPH (benign prostatic hyperplasia), Chronic low back pain, Depression, Diverticulitis, Elevated PSA, Foot drop,  right, Gait disorder (02/03/2015), Hip pain, History of colonoscopy, HOH (hard of hearing), Hypertension, Lumbar radiculopathy, Mitral valve prolapse, Onychomycosis, Peptic ulcer disease, Sepsis ([place]) (2005), Sleep apnea, Torn ACL, and Tracheobronchomalacia.   reports that he quit smoking about 60 years ago. His smoking use included cigarettes. He started smoking about 61 years ago. He smoked 0.50 packs per day. He has never used smokeless tobacco.  Past Surgical History:  Procedure Laterality Date  . CARDIOVERSION N/A 03/27/2020   Procedure: CARDIOVERSION;  Surgeon: Skeet Latch, MD;  Location: [facility];  Service: Cardiovascular;  Laterality: N/A;  . CATARACT EXTRACTION Bilateral   . ESOPHAGOGASTRODUODENOSCOPY N/A 08/19/2015   Procedure: ESOPHAGOGASTRODUODENOSCOPY (EGD);  Surgeon: Laurence Spates, MD;  Location: [facility] ENDOSCOPY;  Service: Endoscopy;  Laterality: N/A;  . right knee replacement Right 1996  . SHOULDER SURGERY    . TEE WITHOUT CARDIOVERSION N/A 03/27/2020   Procedure: TRANSESOPHAGEAL ECHOCARDIOGRAM (TEE);  Surgeon: Skeet Latch, MD;  Location: [facility];  Service: Cardiovascular;  Laterality: N/A;  . TONSILLECTOMY    . TOTAL HIP ARTHROPLASTY Right 08/13/2019   Procedure: RIGHT TOTAL HIP ARTHROPLASTY ANTERIOR APPROACH;  Surgeon: Mcarthur Rossetti, MD;  Location: [facility];  Service: Orthopedics;  Laterality: Right;    No Known Allergies  Immunization History  Administered Date(s) Administered  . Influenza Split 08/29/2012  . Influenza, High Dose Seasonal PF 09/10/2017, 08/14/2018, 08/29/2019  . Influenza,inj,Quad PF,6+ Mos 09/12/2013, 09/11/2015  . Moderna SARS-COVID-2 Vaccination 11/21/2019, 12/22/2019, 09/29/2020  . Pneumococcal Conjugate-13 01/13/2014  . Pneumococcal Polysaccharide-23 11/14/2004  . Tdap 01/18/2016    Family History  Problem Relation Age of Onset  . Pneumonia Mother 27  . Cancer - Lung Father 72  . Multiple myeloma Brother 24      Current Outpatient Medications:  .  allopurinol (ZYLOPRIM) 100 MG tablet, TAKE 1 TABLET ONCE DAILY., Disp: 90 tablet, Rfl: 0 .  atorvastatin (LIPITOR) 10 MG tablet, Take 10 mg by mouth daily. , Disp: , Rfl: 10 .  diltiazem (CARDIZEM CD) 240 MG 24 hr capsule, TAKE (1) CAPSULE DAILY., Disp: 30 capsule, Rfl: 6 .  dofetilide (TIKOSYN) 500 MCG capsule, Take 1 capsule (500 mcg total) by mouth 2 (two) times daily., Disp: 60 capsule, Rfl: 6 .  dutasteride (AVODART) 0.5 MG capsule, Take 0.5 mg by mouth daily., Disp: , Rfl: 2 .  ELIQUIS 5 MG TABS tablet, TAKE 1 TABLET BY MOUTH TWICE DAILY., Disp: 60 tablet, Rfl: 5 .  furosemide (LASIX) 40 MG tablet, Taking 1.5 tablets by mouth daily, Disp: , Rfl:  .  methylPREDNISolone (MEDROL) 4 MG tablet, Take as directed, Disp: 21 tablet, Rfl: 0 .  metoprolol tartrate (LOPRESSOR) 25 MG tablet, Take 25 mg by mouth daily., Disp: , Rfl:  .  mirtazapine (REMERON) 30 MG tablet, TAKE ONE TABLET AT BEDTIME., Disp: 90 tablet, Rfl: 0 .  Multiple Vitamin (MULTIVITAMIN) tablet, Take 1 tablet by mouth daily., Disp: , Rfl:  .  Omega-3 Fatty Acids (FISH OIL PO), Take 1,200 mg by mouth daily. , Disp: , Rfl:  .  Polyethyl Glycol-Propyl Glycol (SYSTANE) 0.4-0.3 % SOLN, Place 1 drop into both eyes in the morning, at noon, and at bedtime. , Disp: , Rfl:  .  potassium chloride (KLOR-CON) 20 MEQ tablet, Take 1 tablet (20 mEq total) by mouth 2 (two) times daily., Disp: 60 tablet, Rfl: 6 .  Probiotic Product (PROBIOTIC DAILY PO), Take 1 tablet by mouth  daily., Disp: , Rfl:  .  pyridOXINE (VITAMIN B-6) 100 MG tablet, Take 100 mg by mouth daily., Disp: , Rfl:  .  Respiratory Therapy Supplies (FLUTTER) DEVI, Use the flutter valve 10 times when doing it daily., Disp: 1 each, Rfl: 0 .  tamsulosin (FLOMAX) 0.4 MG CAPS capsule, Take 0.4 mg by mouth daily., Disp: , Rfl: 2 .  telmisartan (MICARDIS) 20 MG tablet, Take 20 mg by mouth daily., Disp: , Rfl:  .  Turmeric 500 MG CAPS, Take 500 mg by  mouth daily., Disp: , Rfl:  .  vitamin B-12 (CYANOCOBALAMIN) 1000 MCG tablet, Take 1,000 mcg by mouth daily., Disp: , Rfl:  .  Vitamin D, Ergocalciferol, (DRISDOL) 1.25 MG (50000 UNIT) CAPS capsule, TAKE 1 CAPSULE WEEKLY., Disp: 12 capsule, Rfl: 0      Objective:   Vitals:   10/06/20 1355  BP: 128/80  Pulse: 67  Temp: (!) 97.3 F (36.3 C)  TempSrc: Oral  SpO2: 96%  Weight: 209 lb 11.2 oz (95.1 kg)  Height: 5' 9.5" (1.765 m)    Estimated body mass index is 30.52 kg/m as calculated from the following:   Height as of this encounter: 5' 9.5" (1.765 m).   Weight as of this encounter: 209 lb 11.2 oz (95.1 kg).  @WEIGHTCHANGE @  [employer]   10/06/20 1355  Weight: 209 lb 11.2 oz (95.1 kg)     Physical Exam General: No distress. obese Neuro: Alert and Oriented x 3. GCS 15. Speech normal Psych: Pleasant Resp:  Barrel Chest - no.  Wheeze - no, Crackles - no, No overt respiratory distress CVS: Normal heart sounds. Murmurs - no Ext: Stigmata of Connective Tissue Disease - no HEENT: Normal upper airway. PEERL +. No post nasal drip        Assessment:       ICD-10-CM   1. Shortness of breath  R06.02   2. Tracheobronchomalacia  J39.8        Plan:     Patient Instructions    1. Shortness of breath 2. Tracheobronchomalacia  -Radiologist did not make a comment about tracheobronchomalacia -Ennever shortness of breath is not much of an issue after treatment of atrial fibrillation and also because he limited in immobility because of her hip issues  Plan -Continue atrial fibrillation management -Continue pain attention to your right hip to improve your mobility -Hold off on restarting inhalers for the moment because of limited symptoms -I will touch base with one of the radiologist to see if there is still some tracheobronchomalacia  Follow-up -6 months or sooner if needed     SIGNATURE    Dr. Brand Males, M.D., F.C.C.P,  Pulmonary and Critical Care  Medicine Staff Physician, [place] Director - Interstitial Lung Disease  Program  Pulmonary [place] at [place], [place], [address]  Pager: [phone], If no answer or between  15:00h - 7:00h: call [phone] Telephone: [phone]  2:19 PM 10/06/2020

## 2020-10-06 NOTE — Patient Instructions (Addendum)
   1. Shortness of breath 2. Tracheobronchomalacia  -Radiologist did not make a comment about tracheobronchomalacia -Ennever shortness of breath is not much of an issue after treatment of atrial fibrillation and also because he limited in immobility because of her hip issues  Plan -Continue atrial fibrillation management -Continue pain attention to your right hip to improve your mobility -Hold off on restarting inhalers for the moment because of limited symptoms -I will touch base with one of the radiologist to see if there is still some tracheobronchomalacia  Follow-up -6 months or sooner if needed

## 2020-10-12 DIAGNOSIS — M545 Low back pain, unspecified: Secondary | ICD-10-CM | POA: Diagnosis not present

## 2020-10-12 DIAGNOSIS — M25551 Pain in right hip: Secondary | ICD-10-CM | POA: Diagnosis not present

## 2020-10-15 DIAGNOSIS — M25551 Pain in right hip: Secondary | ICD-10-CM | POA: Diagnosis not present

## 2020-10-15 DIAGNOSIS — M545 Low back pain, unspecified: Secondary | ICD-10-CM | POA: Diagnosis not present

## 2020-10-19 DIAGNOSIS — M25551 Pain in right hip: Secondary | ICD-10-CM | POA: Diagnosis not present

## 2020-10-19 DIAGNOSIS — M545 Low back pain, unspecified: Secondary | ICD-10-CM | POA: Diagnosis not present

## 2020-10-27 DIAGNOSIS — Z85828 Personal history of other malignant neoplasm of skin: Secondary | ICD-10-CM | POA: Diagnosis not present

## 2020-10-27 DIAGNOSIS — C44222 Squamous cell carcinoma of skin of right ear and external auricular canal: Secondary | ICD-10-CM | POA: Diagnosis not present

## 2020-11-02 DIAGNOSIS — M545 Low back pain, unspecified: Secondary | ICD-10-CM | POA: Diagnosis not present

## 2020-11-02 DIAGNOSIS — M5416 Radiculopathy, lumbar region: Secondary | ICD-10-CM | POA: Diagnosis not present

## 2020-11-02 DIAGNOSIS — M25551 Pain in right hip: Secondary | ICD-10-CM | POA: Diagnosis not present

## 2020-11-04 DIAGNOSIS — M5416 Radiculopathy, lumbar region: Secondary | ICD-10-CM | POA: Diagnosis not present

## 2020-11-04 DIAGNOSIS — M545 Low back pain, unspecified: Secondary | ICD-10-CM | POA: Diagnosis not present

## 2020-11-04 DIAGNOSIS — M25551 Pain in right hip: Secondary | ICD-10-CM | POA: Diagnosis not present

## 2020-11-18 DIAGNOSIS — M5416 Radiculopathy, lumbar region: Secondary | ICD-10-CM | POA: Diagnosis not present

## 2020-11-18 DIAGNOSIS — M545 Low back pain, unspecified: Secondary | ICD-10-CM | POA: Diagnosis not present

## 2020-11-18 DIAGNOSIS — M25551 Pain in right hip: Secondary | ICD-10-CM | POA: Diagnosis not present

## 2020-11-20 DIAGNOSIS — M5416 Radiculopathy, lumbar region: Secondary | ICD-10-CM | POA: Diagnosis not present

## 2020-11-20 DIAGNOSIS — M545 Low back pain, unspecified: Secondary | ICD-10-CM | POA: Diagnosis not present

## 2020-11-20 DIAGNOSIS — M25551 Pain in right hip: Secondary | ICD-10-CM | POA: Diagnosis not present

## 2020-12-01 ENCOUNTER — Other Ambulatory Visit: Payer: Self-pay | Admitting: Student

## 2020-12-01 DIAGNOSIS — M5416 Radiculopathy, lumbar region: Secondary | ICD-10-CM | POA: Diagnosis not present

## 2020-12-01 DIAGNOSIS — M545 Low back pain, unspecified: Secondary | ICD-10-CM | POA: Diagnosis not present

## 2020-12-01 DIAGNOSIS — M25551 Pain in right hip: Secondary | ICD-10-CM | POA: Diagnosis not present

## 2020-12-03 DIAGNOSIS — M25551 Pain in right hip: Secondary | ICD-10-CM | POA: Diagnosis not present

## 2020-12-03 DIAGNOSIS — M5416 Radiculopathy, lumbar region: Secondary | ICD-10-CM | POA: Diagnosis not present

## 2020-12-03 DIAGNOSIS — M545 Low back pain, unspecified: Secondary | ICD-10-CM | POA: Diagnosis not present

## 2020-12-08 DIAGNOSIS — M25551 Pain in right hip: Secondary | ICD-10-CM | POA: Diagnosis not present

## 2020-12-08 DIAGNOSIS — M5416 Radiculopathy, lumbar region: Secondary | ICD-10-CM | POA: Diagnosis not present

## 2020-12-08 DIAGNOSIS — M545 Low back pain, unspecified: Secondary | ICD-10-CM | POA: Diagnosis not present

## 2020-12-10 DIAGNOSIS — M5416 Radiculopathy, lumbar region: Secondary | ICD-10-CM | POA: Diagnosis not present

## 2020-12-10 DIAGNOSIS — M25551 Pain in right hip: Secondary | ICD-10-CM | POA: Diagnosis not present

## 2020-12-10 DIAGNOSIS — M545 Low back pain, unspecified: Secondary | ICD-10-CM | POA: Diagnosis not present

## 2020-12-14 DIAGNOSIS — J479 Bronchiectasis, uncomplicated: Secondary | ICD-10-CM | POA: Diagnosis not present

## 2020-12-14 DIAGNOSIS — F329 Major depressive disorder, single episode, unspecified: Secondary | ICD-10-CM | POA: Diagnosis not present

## 2020-12-14 DIAGNOSIS — I10 Essential (primary) hypertension: Secondary | ICD-10-CM | POA: Diagnosis not present

## 2020-12-14 DIAGNOSIS — N401 Enlarged prostate with lower urinary tract symptoms: Secondary | ICD-10-CM | POA: Diagnosis not present

## 2020-12-14 DIAGNOSIS — I5031 Acute diastolic (congestive) heart failure: Secondary | ICD-10-CM | POA: Diagnosis not present

## 2020-12-14 DIAGNOSIS — K219 Gastro-esophageal reflux disease without esophagitis: Secondary | ICD-10-CM | POA: Diagnosis not present

## 2020-12-14 DIAGNOSIS — I2510 Atherosclerotic heart disease of native coronary artery without angina pectoris: Secondary | ICD-10-CM | POA: Diagnosis not present

## 2020-12-14 DIAGNOSIS — I480 Paroxysmal atrial fibrillation: Secondary | ICD-10-CM | POA: Diagnosis not present

## 2020-12-16 ENCOUNTER — Other Ambulatory Visit: Payer: Self-pay

## 2020-12-16 ENCOUNTER — Ambulatory Visit (HOSPITAL_COMMUNITY)
Admission: RE | Admit: 2020-12-16 | Discharge: 2020-12-16 | Disposition: A | Discharge status: Home / Self Care | Payer: Medicare Other | Source: Ambulatory Visit | Attending: Nurse Practitioner | Admitting: Nurse Practitioner

## 2020-12-16 ENCOUNTER — Encounter (HOSPITAL_COMMUNITY): Payer: Self-pay | Admitting: Nurse Practitioner

## 2020-12-16 VITALS — BP 118/74 | HR 63 | Ht 69.5 in | Wt 210.6 lb

## 2020-12-16 DIAGNOSIS — I4819 Other persistent atrial fibrillation: Secondary | ICD-10-CM | POA: Diagnosis not present

## 2020-12-16 DIAGNOSIS — I440 Atrioventricular block, first degree: Secondary | ICD-10-CM | POA: Diagnosis not present

## 2020-12-16 DIAGNOSIS — Z7901 Long term (current) use of anticoagulants: Secondary | ICD-10-CM | POA: Insufficient documentation

## 2020-12-16 DIAGNOSIS — Z79899 Other long term (current) drug therapy: Secondary | ICD-10-CM | POA: Diagnosis not present

## 2020-12-16 DIAGNOSIS — Z87891 Personal history of nicotine dependence: Secondary | ICD-10-CM | POA: Diagnosis not present

## 2020-12-16 DIAGNOSIS — I2510 Atherosclerotic heart disease of native coronary artery without angina pectoris: Secondary | ICD-10-CM | POA: Diagnosis not present

## 2020-12-16 DIAGNOSIS — I110 Hypertensive heart disease with heart failure: Secondary | ICD-10-CM | POA: Insufficient documentation

## 2020-12-16 DIAGNOSIS — I5030 Unspecified diastolic (congestive) heart failure: Secondary | ICD-10-CM | POA: Diagnosis not present

## 2020-12-16 DIAGNOSIS — I4892 Unspecified atrial flutter: Secondary | ICD-10-CM | POA: Diagnosis not present

## 2020-12-16 DIAGNOSIS — I4891 Unspecified atrial fibrillation: Secondary | ICD-10-CM | POA: Diagnosis not present

## 2020-12-16 DIAGNOSIS — D6869 Other thrombophilia: Secondary | ICD-10-CM | POA: Diagnosis not present

## 2020-12-16 LAB — BASIC METABOLIC PANEL
Anion gap: 12 (ref 5–15)
BUN: 22 mg/dL (ref 8–23)
CO2: 23 mmol/L (ref 22–32)
Calcium: 9.4 mg/dL (ref 8.9–10.3)
Chloride: 104 mmol/L (ref 98–111)
Creatinine, Ser: 0.95 mg/dL (ref 0.61–1.24)
GFR, Estimated: >60 mL/min (ref >60)
Glucose, Bld: 131 mg/dL — ABNORMAL HIGH (ref 70–99)
Potassium: 4.4 mmol/L (ref 3.5–5.1)
Sodium: 139 mmol/L (ref 135–145)

## 2020-12-16 LAB — MAGNESIUM: Magnesium: 2 mg/dL (ref 1.7–2.4)

## 2020-12-16 NOTE — Progress Notes (Signed)
Primary Care Physician: Lavone Orn, MD Referring Physician: Richardson Dopp, PA Cardiologist: Dr. Salli Quarry GLENDAL CASSADAY is a 87 y.o. male with a h/o CAD, diastolic HF, aortic stenosis,MVP, HTN,  seen by Dr. Burt Knack on 03/05/2020. He reported feeling terrible. He had gained 10-15 lbs over the past two weeks. He had dyspnea with minimal exertion. He had lower extremity edema and abdominal bloating. He denied  chest pain, dizziness, near syncope, syncope, or awareness of his heart racing.    EKG in [facility] office showed atrial flutter with RVR.Patient was directly admitted for IV diuresis and management of atrial flutter.  he had successful TEE/DCCV. He saw Richardson Dopp PA on f/u. He still had shortness of breath and fatigue. He was found to be back in afib. He suggested AAD therapy.  He felt that his pulmonary issues may make amiodarone not a good option. He suggested Tikosyn  and asked him to f/u here for further discussion.   [name] also increased his lasix for several days. Weight is down a few lbs but still has fatigue and shortness of breath. EKg today shows afib at 68 bpm.  Pt is in afib clinic for Tikosyn admit, 6/22. [name] He continues to have no energy in afib, he is short of breath on exertion.  He  has not missed any DOAC doses, no benadryl use.  Qtc in afib acceptable at 435. Pharm D screened drugs and verapamil  was changed to Cardizem several weeks ago. Mirtazapine is not contraindicated, but can be qt prolonging. He is aware of the cost of the drug.   F/u in afib clinic, 7/1 after Tikosyn admit. He is staying in  rhythm and feels better. He is being compliant with tikosyn. qtc is stable. He continues on eliquis 5 mg bid for a CHA2DS2VASc score of 5.  F/u 12/16/20. I am seeing f/u 3 month from  [place]. He is staying  in [place] on [address]. No issues with eliqius with a CHA2DS2VASc score of at least 5. Qtc stable.    Today, he denies symptoms of palpitations, chest pain, shortness of breath,  orthopnea, PND, lower extremity edema, dizziness, presyncope, syncope, or neurologic sequela. The patient is tolerating medications without difficulties and is otherwise without complaint today.   Past Medical History:  Diagnosis Date  . Aortic stenosis   . Arthritis   . Atrial fibrillation ([place])   . BPH (benign prostatic hyperplasia)   . Chronic low back pain   . Depression   . Diverticulitis   . Elevated PSA   . Foot drop, right   . Gait disorder 02/03/2015  . Hip pain   . History of colonoscopy   . HOH (hard of hearing)    hearing aid  . Hypertension   . Lumbar radiculopathy   . Mitral valve prolapse   . Onychomycosis   . Peptic ulcer disease   . Sepsis ([place]) 2005   e. coli-after prostate bx  . Sleep apnea   . Torn ACL   . Tracheobronchomalacia    seen on 2019 Chest CT   Past Surgical History:  Procedure Laterality Date  . CARDIOVERSION N/A 03/27/2020   Procedure: CARDIOVERSION;  Surgeon: Skeet Latch, MD;  Location: [place];  Service: Cardiovascular;  Laterality: N/A;  . CATARACT EXTRACTION Bilateral   . ESOPHAGOGASTRODUODENOSCOPY N/A 08/19/2015   Procedure: ESOPHAGOGASTRODUODENOSCOPY (EGD);  Surgeon: Laurence Spates, MD;  Location[facility] ENDOSCOPY;  Service: Endoscopy;  Laterality: N/A;  . right knee replacement Right 1996  .  SHOULDER SURGERY    . TEE WITHOUT CARDIOVERSION N/A 03/27/2020   Procedure: TRANSESOPHAGEAL ECHOCARDIOGRAM (TEE);  Surgeon: Skeet Latch, MD;  Location: [place];  Service: Cardiovascular;  Laterality: N/A;  . TONSILLECTOMY    . TOTAL HIP ARTHROPLASTY Right 08/13/2019   Procedure: RIGHT TOTAL HIP ARTHROPLASTY ANTERIOR APPROACH;  Surgeon: Mcarthur Rossetti, MD;  Location: [facility];  Service: Orthopedics;  Laterality: Right;    Current Outpatient Medications  Medication Sig Dispense Refill  . allopurinol (ZYLOPRIM) 100 MG tablet TAKE 1 TABLET ONCE DAILY. 90 tablet 0  . atorvastatin (LIPITOR) 10 MG tablet Take 10 mg by mouth daily.    10  . diltiazem (CARDIZEM CD) 240 MG 24 hr capsule TAKE (1) CAPSULE DAILY. 30 capsule 6  . dofetilide (TIKOSYN) 500 MCG capsule TAKE (1) CAPSULE TWICE DAILY. 60 capsule 3  . dutasteride (AVODART) 0.5 MG capsule Take 0.5 mg by mouth daily.  2  . ELIQUIS 5 MG TABS tablet TAKE 1 TABLET BY MOUTH TWICE DAILY. 60 tablet 5  . furosemide (LASIX) 40 MG tablet Taking 1.5 tablets by mouth daily    . methylPREDNISolone (MEDROL) 4 MG tablet Take as directed 21 tablet 0  . metoprolol tartrate (LOPRESSOR) 25 MG tablet Take 25 mg by mouth daily.    . mirtazapine (REMERON) 30 MG tablet TAKE ONE TABLET AT BEDTIME. 90 tablet 0  . Multiple Vitamin (MULTIVITAMIN) tablet Take 1 tablet by mouth daily.    . Omega-3 Fatty Acids (FISH OIL PO) Take 1,200 mg by mouth daily.     Vladimir Faster Glycol-Propyl Glycol (SYSTANE) 0.4-0.3 % SOLN Place 1 drop into both eyes in the morning, at noon, and at bedtime.    . potassium chloride (KLOR-CON) 20 MEQ tablet Take 1 tablet (20 mEq total) by mouth 2 (two) times daily. 60 tablet 6  . Probiotic Product (PROBIOTIC DAILY PO) Take 1 tablet by mouth daily.    [name] pyridOXINE (VITAMIN B-6) 100 MG tablet Take 100 mg by mouth daily.    [name] Respiratory Therapy Supplies (FLUTTER) DEVI Use the flutter valve 10 times when doing it daily. 1 each 0  . tamsulosin (FLOMAX) 0.4 MG CAPS capsule Take 0.4 mg by mouth daily.  2  . telmisartan (MICARDIS) 20 MG tablet Take 20 mg by mouth daily.    . Turmeric 500 MG CAPS Take 500 mg by mouth daily.    . vitamin B-12 (CYANOCOBALAMIN) 1000 MCG tablet Take 1,000 mcg by mouth daily.    . Vitamin D, Ergocalciferol, (DRISDOL) 1.25 MG (50000 UNIT) CAPS capsule TAKE 1 CAPSULE WEEKLY. 12 capsule 0   No current facility-administered medications for this encounter.    No Known Allergies  Social History   Socioeconomic History  . Marital status: Married    Spouse name: Not on file  . Number of children: 2  . Years of education: Not on file  . Highest education  level: Not on file  Occupational History  . Occupation: Retired Social research officer, government  Tobacco Use  . Smoking status: Former Smoker    Packs/day: 0.50    Types: Cigarettes    Start date: 1960    Quit date: 1961    Years since quitting: 61.1  . Smokeless tobacco: Never Used  . Tobacco comment: Pt smoked for 19month when in [employer]  Vaping Use  . Vaping Use: Never used  Substance and Sexual Activity  . Alcohol use: Yes    Alcohol/week: 2.0 - 3.0 standard drinks    Types: 2 -  3 Glasses of wine per week  . Drug use: No  . Sexual activity: Yes  Other Topics Concern  . Not on file  Social History Narrative   Patient is right handed.   Patient drinks 1 cup of coffee per day.   Social Determinants of Health   Financial Resource Strain: Not on file  Food Insecurity: Not on file  Transportation Needs: Not on file  Physical Activity: Not on file  Stress: Not on file  Social Connections: Not on file  Intimate Partner Violence: Not on file    Family History  Problem Relation Age of Onset  . Pneumonia Mother 45  . Cancer - Lung Father 84  . Multiple myeloma Brother 55    ROS- All systems are reviewed and negative except as per the HPI above  Physical Exam: Vitals:   12/16/20 0927  BP: 118/74  Pulse: 63  Weight: 95.5 kg  Height: 5' 9.5" (1.765 m)   Wt Readings from Last 3 Encounters:  12/16/20 95.5 kg  10/06/20 95.1 kg  09/16/20 94.2 kg    Labs: Lab Results  Component Value Date   NA 142 09/16/2020   K 4.5 09/16/2020   CL 103 09/16/2020   CO2 27 09/16/2020   GLUCOSE 108 (H) 09/16/2020   BUN 24 09/16/2020   CREATININE 1.00 09/16/2020   CALCIUM 10.2 09/16/2020   MG 2.1 09/16/2020   No results found for: INR Lab Results  Component Value Date   CHOL 96 03/06/2020   HDL 48 03/06/2020   LDLCALC 38 03/06/2020   TRIG 50 03/06/2020     GEN- The patient is well appearing, alert and oriented x 3 today.   Head- normocephalic, atraumatic Eyes-  Sclera clear,  conjunctiva pink Ears- hearing intact Oropharynx- clear Neck- supple, no JVP Lymph- no cervical lymphadenopathy Lungs- Clear to ausculation bilaterally, normal work of breathing Heart- regular rate and rhythm, 2/6 sys murmur, no rubs or gallops, PMI not laterally displaced GI- soft, NT, ND, + BS Extremities- no clubbing, cyanosis, or edema MS- no significant deformity or atrophy Skin- no rash or lesion Psych- euthymic mood, full affect Neuro- strength and sensation are intact  EKG- sinus rhythm with first degree AVB qrs int 90 ms, qtc 462 ms   Echo-1. Left ventricular ejection fraction, by estimation, is 60 to 65%. The left ventricle has normal function. The left ventricle has no regional wall motion abnormalities. 2. Prominent moderator band. Right ventricular systolic function is normal. The right ventricular size is normal. 3. Left atrial size was severely dilated. No left atrial/left atrial appendage thrombus was detected. 4. Right atrial size was mildly dilated. 5. The mitral valve is degenerative. Moderate mitral valve regurgitation. No evidence of mitral stenosis. 6. The aortic valve is tricuspid. Aortic valve regurgitation is mild. Mild aortic valve stenosis. 7. Aortic dilatation noted. There is mild dilatation of the ascending aorta measuring 38 mm. There is mild (Grade II) atheroma plaque involving the transverse aorta. 8. The inferior vena cava is normal in size with greater than 50% respiratory variability, suggesting right atrial pressure of 3 mmHg.    Assessment and Plan: 1. Afib/flutter  He was quite symptomatic with atrial fibrillation He is now on tikosyn 500 mcg bid and staying in SR  Continue Cardizem 240 mg daily and metoprolol tartrate 25 mg daily  Bmet/mag today   2. CHA2DS2VASc score of at least 5 Continue  eliquis 5 mg bid  Creatinine stable at 1.0 drawn 09/16/20   3.  Diastolic HF Continue  lasix at 60 mg daily Avoid salt  Daily  weights Normovolemic    f/u with Dr. Rayann Heman in May per recall   Geroge Baseman. , [facility] [address] [place], [place] [address] [phone]

## 2021-01-11 DIAGNOSIS — I10 Essential (primary) hypertension: Secondary | ICD-10-CM | POA: Diagnosis not present

## 2021-02-03 ENCOUNTER — Other Ambulatory Visit: Payer: Self-pay | Admitting: Student

## 2021-02-11 DIAGNOSIS — I480 Paroxysmal atrial fibrillation: Secondary | ICD-10-CM | POA: Diagnosis not present

## 2021-02-11 DIAGNOSIS — I2510 Atherosclerotic heart disease of native coronary artery without angina pectoris: Secondary | ICD-10-CM | POA: Diagnosis not present

## 2021-02-11 DIAGNOSIS — I5031 Acute diastolic (congestive) heart failure: Secondary | ICD-10-CM | POA: Diagnosis not present

## 2021-02-11 DIAGNOSIS — N401 Enlarged prostate with lower urinary tract symptoms: Secondary | ICD-10-CM | POA: Diagnosis not present

## 2021-02-11 DIAGNOSIS — J479 Bronchiectasis, uncomplicated: Secondary | ICD-10-CM | POA: Diagnosis not present

## 2021-02-11 DIAGNOSIS — K219 Gastro-esophageal reflux disease without esophagitis: Secondary | ICD-10-CM | POA: Diagnosis not present

## 2021-02-11 DIAGNOSIS — I10 Essential (primary) hypertension: Secondary | ICD-10-CM | POA: Diagnosis not present

## 2021-02-11 DIAGNOSIS — F329 Major depressive disorder, single episode, unspecified: Secondary | ICD-10-CM | POA: Diagnosis not present

## 2021-03-03 ENCOUNTER — Other Ambulatory Visit: Payer: Self-pay | Admitting: Internal Medicine

## 2021-03-08 ENCOUNTER — Other Ambulatory Visit: Payer: Self-pay | Admitting: Student

## 2021-03-08 NOTE — Telephone Encounter (Signed)
Pt's age 87, wt 95.5 kg, SCr 0.95, CrCl 75.39, last ov w/ JA 09/16/20.

## 2021-03-12 DIAGNOSIS — I10 Essential (primary) hypertension: Secondary | ICD-10-CM | POA: Diagnosis not present

## 2021-03-23 DIAGNOSIS — H353121 Nonexudative age-related macular degeneration, left eye, early dry stage: Secondary | ICD-10-CM | POA: Diagnosis not present

## 2021-03-23 DIAGNOSIS — H52223 Regular astigmatism, bilateral: Secondary | ICD-10-CM | POA: Diagnosis not present

## 2021-03-23 DIAGNOSIS — H353111 Nonexudative age-related macular degeneration, right eye, early dry stage: Secondary | ICD-10-CM | POA: Diagnosis not present

## 2021-03-23 DIAGNOSIS — H04121 Dry eye syndrome of right lacrimal gland: Secondary | ICD-10-CM | POA: Diagnosis not present

## 2021-03-23 DIAGNOSIS — Z961 Presence of intraocular lens: Secondary | ICD-10-CM | POA: Diagnosis not present

## 2021-03-23 DIAGNOSIS — H04122 Dry eye syndrome of left lacrimal gland: Secondary | ICD-10-CM | POA: Diagnosis not present

## 2021-03-23 DIAGNOSIS — H524 Presbyopia: Secondary | ICD-10-CM | POA: Diagnosis not present

## 2021-03-23 DIAGNOSIS — Z9849 Cataract extraction status, unspecified eye: Secondary | ICD-10-CM | POA: Diagnosis not present

## 2021-03-23 DIAGNOSIS — I10 Essential (primary) hypertension: Secondary | ICD-10-CM | POA: Diagnosis not present

## 2021-03-23 DIAGNOSIS — H5212 Myopia, left eye: Secondary | ICD-10-CM | POA: Diagnosis not present

## 2021-03-26 DIAGNOSIS — N401 Enlarged prostate with lower urinary tract symptoms: Secondary | ICD-10-CM | POA: Diagnosis not present

## 2021-03-26 DIAGNOSIS — I2510 Atherosclerotic heart disease of native coronary artery without angina pectoris: Secondary | ICD-10-CM | POA: Diagnosis not present

## 2021-03-26 DIAGNOSIS — R142 Eructation: Secondary | ICD-10-CM | POA: Diagnosis not present

## 2021-03-26 DIAGNOSIS — Z1389 Encounter for screening for other disorder: Secondary | ICD-10-CM | POA: Diagnosis not present

## 2021-03-26 DIAGNOSIS — I350 Nonrheumatic aortic (valve) stenosis: Secondary | ICD-10-CM | POA: Diagnosis not present

## 2021-03-26 DIAGNOSIS — I5030 Unspecified diastolic (congestive) heart failure: Secondary | ICD-10-CM | POA: Diagnosis not present

## 2021-03-26 DIAGNOSIS — I10 Essential (primary) hypertension: Secondary | ICD-10-CM | POA: Diagnosis not present

## 2021-03-26 DIAGNOSIS — I480 Paroxysmal atrial fibrillation: Secondary | ICD-10-CM | POA: Diagnosis not present

## 2021-03-26 DIAGNOSIS — I484 Atypical atrial flutter: Secondary | ICD-10-CM | POA: Diagnosis not present

## 2021-03-26 DIAGNOSIS — M109 Gout, unspecified: Secondary | ICD-10-CM | POA: Diagnosis not present

## 2021-03-26 DIAGNOSIS — Z0000 Encounter for general adult medical examination without abnormal findings: Secondary | ICD-10-CM | POA: Diagnosis not present

## 2021-03-26 DIAGNOSIS — G4733 Obstructive sleep apnea (adult) (pediatric): Secondary | ICD-10-CM | POA: Diagnosis not present

## 2021-03-26 DIAGNOSIS — A084 Viral intestinal infection, unspecified: Secondary | ICD-10-CM | POA: Diagnosis not present

## 2021-03-26 DIAGNOSIS — I700 Atherosclerosis of aorta: Secondary | ICD-10-CM | POA: Diagnosis not present

## 2021-04-05 DIAGNOSIS — L821 Other seborrheic keratosis: Secondary | ICD-10-CM | POA: Diagnosis not present

## 2021-04-05 DIAGNOSIS — D1801 Hemangioma of skin and subcutaneous tissue: Secondary | ICD-10-CM | POA: Diagnosis not present

## 2021-04-05 DIAGNOSIS — Z85828 Personal history of other malignant neoplasm of skin: Secondary | ICD-10-CM | POA: Diagnosis not present

## 2021-04-05 DIAGNOSIS — L812 Freckles: Secondary | ICD-10-CM | POA: Diagnosis not present

## 2021-04-05 DIAGNOSIS — L570 Actinic keratosis: Secondary | ICD-10-CM | POA: Diagnosis not present

## 2021-04-05 DIAGNOSIS — L853 Xerosis cutis: Secondary | ICD-10-CM | POA: Diagnosis not present

## 2021-04-11 ENCOUNTER — Other Ambulatory Visit (HOSPITAL_COMMUNITY): Payer: Self-pay | Admitting: Nurse Practitioner

## 2021-04-23 ENCOUNTER — Ambulatory Visit: Payer: Medicare Other | Attending: Internal Medicine

## 2021-04-23 ENCOUNTER — Other Ambulatory Visit: Payer: Self-pay

## 2021-04-23 ENCOUNTER — Other Ambulatory Visit (HOSPITAL_BASED_OUTPATIENT_CLINIC_OR_DEPARTMENT_OTHER): Payer: Self-pay

## 2021-04-23 DIAGNOSIS — Z23 Encounter for immunization: Secondary | ICD-10-CM

## 2021-04-23 MED ORDER — COVID-19 MRNA VACC (MODERNA) 100 MCG/0.5ML IM SUSP
INTRAMUSCULAR | 0 refills | Status: DC
  Filled 2021-04-23: qty 0.25, 1d supply, fill #0

## 2021-04-23 NOTE — Progress Notes (Signed)
   Covid-19 Vaccination Clinic  Name:  Patrick Franco    MRN: [id number] DOB: 10/08/1932  04/23/2021  Patrick Franco was observed post Covid-19 immunization for 15 minutes without incident. He was provided with Vaccine Information Sheet and instruction to access the V-Safe system.   Patrick Franco was instructed to call 911 with any severe reactions post vaccine: Difficulty breathing  Swelling of face and throat  A fast heartbeat  A bad rash all over body  Dizziness and weakness   Immunizations Administered     Name Date Dose VIS Date Route   Moderna Covid-19 Booster Vaccine 04/23/2021 11:18 AM 0.25 mL 09/02/2020 Intramuscular   Manufacturer: Moderna   Lot: [id number]   [place]: [phone]

## 2021-04-26 ENCOUNTER — Other Ambulatory Visit: Payer: Self-pay

## 2021-04-26 MED ORDER — METOPROLOL TARTRATE 25 MG PO TABS: 25.0000 mg | ORAL_TABLET | Freq: Every day | ORAL | 1 refills | Status: DC

## 2021-04-28 DIAGNOSIS — G4733 Obstructive sleep apnea (adult) (pediatric): Secondary | ICD-10-CM | POA: Diagnosis not present

## 2021-05-13 DIAGNOSIS — I10 Essential (primary) hypertension: Secondary | ICD-10-CM | POA: Diagnosis not present

## 2021-05-20 ENCOUNTER — Other Ambulatory Visit: Payer: Self-pay | Admitting: Internal Medicine

## 2021-05-23 DIAGNOSIS — N401 Enlarged prostate with lower urinary tract symptoms: Secondary | ICD-10-CM | POA: Diagnosis not present

## 2021-05-23 DIAGNOSIS — I5031 Acute diastolic (congestive) heart failure: Secondary | ICD-10-CM | POA: Diagnosis not present

## 2021-05-23 DIAGNOSIS — K219 Gastro-esophageal reflux disease without esophagitis: Secondary | ICD-10-CM | POA: Diagnosis not present

## 2021-05-23 DIAGNOSIS — I480 Paroxysmal atrial fibrillation: Secondary | ICD-10-CM | POA: Diagnosis not present

## 2021-05-23 DIAGNOSIS — J479 Bronchiectasis, uncomplicated: Secondary | ICD-10-CM | POA: Diagnosis not present

## 2021-05-23 DIAGNOSIS — F329 Major depressive disorder, single episode, unspecified: Secondary | ICD-10-CM | POA: Diagnosis not present

## 2021-05-23 DIAGNOSIS — I2510 Atherosclerotic heart disease of native coronary artery without angina pectoris: Secondary | ICD-10-CM | POA: Diagnosis not present

## 2021-05-23 DIAGNOSIS — I10 Essential (primary) hypertension: Secondary | ICD-10-CM | POA: Diagnosis not present

## 2021-05-23 DIAGNOSIS — I5030 Unspecified diastolic (congestive) heart failure: Secondary | ICD-10-CM | POA: Diagnosis not present

## 2021-05-31 ENCOUNTER — Telehealth: Payer: Self-pay | Admitting: Internal Medicine

## 2021-05-31 MED ORDER — DOFETILIDE 500 MCG PO CAPS: 500.0000 ug | ORAL_CAPSULE | Freq: Two times a day (BID) | ORAL | 0 refills | Status: DC

## 2021-05-31 NOTE — Telephone Encounter (Signed)
Left detailed message on patients voicemail informing that Patrick Franco refilled his medication, but since he called our office the rx has been sent over. Advised the patient to contact our office with any questions or concerns.   Rx(s) sent to pharmacy electronically.

## 2021-05-31 NOTE — Telephone Encounter (Signed)
New message    *STAT* If patient is at the pharmacy, call can be transferred to refill team.   1. Which medications need to be refilled? (please list name of each medication and dose if known) Dofetilide 500 mg  2. Which pharmacy/location (including street and city if local pharmacy) is medication to be sent to? [facility]   3. Do they need a 30 day or 90 day supply? 30 day   Pt states he has a week left, he does not have enough to last until appt on 8/15 with Dr. Rayann Heman

## 2021-06-28 ENCOUNTER — Other Ambulatory Visit: Payer: Self-pay

## 2021-06-28 ENCOUNTER — Ambulatory Visit (INDEPENDENT_AMBULATORY_CARE_PROVIDER_SITE_OTHER): Payer: Medicare Other | Admitting: Internal Medicine

## 2021-06-28 VITALS — BP 150/84 | HR 72 | Ht 69.5 in | Wt 214.4 lb

## 2021-06-28 DIAGNOSIS — I5032 Chronic diastolic (congestive) heart failure: Secondary | ICD-10-CM | POA: Diagnosis not present

## 2021-06-28 DIAGNOSIS — E785 Hyperlipidemia, unspecified: Secondary | ICD-10-CM | POA: Diagnosis not present

## 2021-06-28 DIAGNOSIS — I4819 Other persistent atrial fibrillation: Secondary | ICD-10-CM | POA: Diagnosis not present

## 2021-06-28 MED ORDER — APIXABAN 5 MG PO TABS: 5.0000 mg | ORAL_TABLET | Freq: Two times a day (BID) | ORAL | 3 refills | Status: DC

## 2021-06-28 MED ORDER — DOFETILIDE 500 MCG PO CAPS: 500.0000 ug | ORAL_CAPSULE | Freq: Two times a day (BID) | ORAL | 3 refills | Status: DC

## 2021-06-28 MED ORDER — METOPROLOL TARTRATE 25 MG PO TABS: 25.0000 mg | ORAL_TABLET | Freq: Every day | ORAL | 3 refills | Status: DC

## 2021-06-28 NOTE — Patient Instructions (Addendum)
Medication Instructions:  Your physician recommends that you continue on your current medications as directed. Please refer to the Current Medication list given to you today.  Labwork: BMP, Mag, Pro BNP  Testing/Procedures: Your physician has requested that you have an echocardiogram. Echocardiography is a painless test that uses sound waves to create images of your heart. It provides your doctor with information about the size and shape of your heart and how well your heart's chambers and valves are working. This procedure takes approximately one hour. There are no restrictions for this procedure.   Follow-Up: Your physician wants you to follow-up in: [place] or [place] Mody next available. 6 months with the Afib Clinic.  Any Other Special Instructions Will Be Listed Below (If Applicable).  If you need a refill on your cardiac medications before your next appointment, please call your pharmacy.

## 2021-06-28 NOTE — Progress Notes (Signed)
PCP: Lavone Orn, MD Primary Cardiologist:  Dr Angelena Form Primary EP: Dr Rayann Heman  Patrick Franco is a 87 y.o. male who presents today for routine electrophysiology followup.  Since last being seen in our clinic, the patient reports doing reasonably well.  He has SOB.  This is ongoing. He has not had afib. Today, he denies symptoms of palpitations, chest pain,   lower extremity edema, dizziness, presyncope, or syncope.  The patient is otherwise without complaint today.   Past Medical History:  Diagnosis Date   Aortic stenosis    Arthritis    Atrial fibrillation ([facility])    BPH (benign prostatic hyperplasia)    Chronic low back pain    Depression    Diverticulitis    Elevated PSA    Foot drop, right    Gait disorder 02/03/2015   Hip pain    History of colonoscopy    HOH (hard of hearing)    hearing aid   Hypertension    Lumbar radiculopathy    Mitral valve prolapse    Onychomycosis    Peptic ulcer disease    Sepsis ([place]) 2005   e. coli-after prostate bx   Sleep apnea    Torn ACL    Tracheobronchomalacia    seen on 2019 Chest CT   Past Surgical History:  Procedure Laterality Date   CARDIOVERSION N/A 03/27/2020   Procedure: CARDIOVERSION;  Surgeon: Skeet Latch, MD;  Location: [place];  Service: Cardiovascular;  Laterality: N/A;   CATARACT EXTRACTION Bilateral    ESOPHAGOGASTRODUODENOSCOPY N/A 08/19/2015   Procedure: ESOPHAGOGASTRODUODENOSCOPY (EGD);  Surgeon: Laurence Spates, MD;  [facility] ENDOSCOPY;  Service: Endoscopy;  Laterality: N/A;   right knee replacement Right 1996   SHOULDER SURGERY     TEE WITHOUT CARDIOVERSION N/A 03/27/2020   Procedure: TRANSESOPHAGEAL ECHOCARDIOGRAM (TEE);  Surgeon: Skeet Latch, MD;  Location: [place];  Service: Cardiovascular;  Laterality: N/A;   TONSILLECTOMY     TOTAL HIP ARTHROPLASTY Right 08/13/2019   Procedure: RIGHT TOTAL HIP ARTHROPLASTY ANTERIOR APPROACH;  Surgeon: Mcarthur Rossetti, MD;  Location: [place];   Service: Orthopedics;  Laterality: Right;    ROS- all systems are reviewed and negatives except as per HPI above  Current Outpatient Medications  Medication Sig Dispense Refill   allopurinol (ZYLOPRIM) 100 MG tablet TAKE 1 TABLET ONCE DAILY. 90 tablet 0   atorvastatin (LIPITOR) 10 MG tablet Take 10 mg by mouth daily.   10   COVID-19 mRNA vaccine, Moderna, 100 MCG/0.5ML injection Inject into the muscle. 0.25 mL 0   diltiazem (CARDIZEM CD) 240 MG 24 hr capsule TAKE (1) CAPSULE DAILY. 30 capsule 6   dofetilide (TIKOSYN) 500 MCG capsule Take 1 capsule (500 mcg total) by mouth 2 (two) times daily. 60 capsule 0   dutasteride (AVODART) 0.5 MG capsule Take 0.5 mg by mouth daily.  2   ELIQUIS 5 MG TABS tablet TAKE 1 TABLET BY MOUTH TWICE DAILY. 60 tablet 5   furosemide (LASIX) 40 MG tablet Taking 1.5 tablets by mouth daily     methylPREDNISolone (MEDROL) 4 MG tablet Take as directed 21 tablet 0   metoprolol tartrate (LOPRESSOR) 25 MG tablet Take 1 tablet (25 mg total) by mouth daily. Please make appt with Dr. Rayann Heman before anymore refills. Thank you 1st attempt 90 tablet 1   mirtazapine (REMERON) 30 MG tablet TAKE ONE TABLET AT BEDTIME. 90 tablet 0   Multiple Vitamin (MULTIVITAMIN) tablet Take 1 tablet by mouth daily.     Omega-3  Fatty Acids (FISH OIL PO) Take 1,200 mg by mouth daily.      Polyethyl Glycol-Propyl Glycol (SYSTANE) 0.4-0.3 % SOLN Place 1 drop into both eyes in the morning, at noon, and at bedtime.     potassium chloride (KLOR-CON) 20 MEQ tablet Take 1 tablet (20 mEq total) by mouth 2 (two) times daily. 60 tablet 6   Probiotic Product (PROBIOTIC DAILY PO) Take 1 tablet by mouth daily.     pyridOXINE (VITAMIN B-6) 100 MG tablet Take 100 mg by mouth daily.     Respiratory Therapy Supplies (FLUTTER) DEVI Use the flutter valve 10 times when doing it daily. 1 each 0   tamsulosin (FLOMAX) 0.4 MG CAPS capsule Take 0.4 mg by mouth daily.  2   telmisartan (MICARDIS) 20 MG tablet Take 20 mg by  mouth daily.     Turmeric 500 MG CAPS Take 500 mg by mouth daily.     vitamin B-12 (CYANOCOBALAMIN) 1000 MCG tablet Take 1,000 mcg by mouth daily.     Vitamin D, Ergocalciferol, (DRISDOL) 1.25 MG (50000 UNIT) CAPS capsule TAKE 1 CAPSULE WEEKLY. 12 capsule 0   No current facility-administered medications for this visit.    Physical Exam: Vitals:   06/28/21 0845  BP: (!) 150/84  Pulse: 72  SpO2: 94%  Weight: 214 lb 6.4 oz (97.3 kg)  Height: 5' 9.5" (1.765 m)    GEN- The patient is elderly appearing, alert and oriented x 3 today.   Head- normocephalic, atraumatic Eyes-  Sclera clear, conjunctiva pink Ears- hearing intact Oropharynx- clear Lungs- Clear to ausculation bilaterally, normal work of breathing Heart- Regular rate and rhythm,, 2/6 SEM LUSB which is mid to late peaking GI- soft, NT, ND, + BS Extremities- no clubbing, cyanosis, or edema  Wt Readings from Last 3 Encounters:  06/28/21 214 lb 6.4 oz (97.3 kg)  12/16/20 210 lb 9.6 oz (95.5 kg)  10/06/20 209 lb 11.2 oz (95.1 kg)    EKG tracing ordered today is personally reviewed and shows sinus rhythm 72 bpm, PR 258 msec, Qtc 481 msec (stable conpared to 09/16/20 ekg)  Assessment and Plan:  Persistent afib Maintaining sinus with tikosyn Chads2vasc score is 5.  He is on eliquis Labs 2/22 reviewed Repeat bmet, mg today   2. Chronic diastolic dysfunction Euvolemic today No changes Check pro BNP  3. HL Continue atorvastatin '10mg'$  daily  4. AS Previously mild At least moderate by exam Repeat echo  5. SOB Multifactoral He has tracheal malacia. Update echo to evaluate AS Pro BNP as above CBC to evaluate for anemia Will arrange general cardiology follow-up (overdue)  Risks, benefits and potential toxicities for medications prescribed and/or refilled reviewed with patient today.   Follow-up in AF clinic every 6 months I will see as needed  Thompson Grayer MD, [facility] 06/28/2021 9:10 AM

## 2021-06-29 LAB — BASIC METABOLIC PANEL
BUN/Creatinine Ratio: 23 (ref 10–24)
BUN: 24 mg/dL (ref 8–27)
CO2: 24 mmol/L (ref 20–29)
Calcium: 9.8 mg/dL (ref 8.6–10.2)
Chloride: 101 mmol/L (ref 96–106)
Creatinine, Ser: 1.05 mg/dL (ref 0.76–1.27)
Glucose: 121 mg/dL — ABNORMAL HIGH (ref 65–99)
Potassium: 4.4 mmol/L (ref 3.5–5.2)
Sodium: 140 mmol/L (ref 134–144)
eGFR: 69 mL/min/{1.73_m2} (ref >59)

## 2021-06-29 LAB — MAGNESIUM: Magnesium: 2 mg/dL (ref 1.6–2.3)

## 2021-06-29 LAB — PRO B NATRIURETIC PEPTIDE: NT-Pro BNP: 246 pg/mL (ref 0–486)

## 2021-07-16 ENCOUNTER — Other Ambulatory Visit: Payer: Self-pay

## 2021-07-16 ENCOUNTER — Ambulatory Visit (HOSPITAL_COMMUNITY): Payer: Medicare Other | Attending: Cardiology

## 2021-07-16 DIAGNOSIS — I4819 Other persistent atrial fibrillation: Secondary | ICD-10-CM | POA: Diagnosis not present

## 2021-07-16 DIAGNOSIS — I5032 Chronic diastolic (congestive) heart failure: Secondary | ICD-10-CM | POA: Diagnosis not present

## 2021-07-16 DIAGNOSIS — E785 Hyperlipidemia, unspecified: Secondary | ICD-10-CM | POA: Insufficient documentation

## 2021-07-16 DIAGNOSIS — I350 Nonrheumatic aortic (valve) stenosis: Secondary | ICD-10-CM

## 2021-07-16 LAB — ECHOCARDIOGRAM COMPLETE
AR max vel: 0.94 cm2
AV Area VTI: 0.95 cm2
AV Area mean vel: 1.02 cm2
AV Mean grad: 26.5 mmHg
AV Peak grad: 55.7 mmHg
Ao pk vel: 3.73 m/s
Area-P 1/2: 2.62 cm2
MV M vel: 5.81 m/s
MV Peak grad: 134.9 mmHg
S' Lateral: 2.5 cm

## 2021-08-09 NOTE — Progress Notes (Addendum)
Cardiology Office Note:    Date:  08/10/2021   ID:  Patrick Franco, DOB 07/24/1932, MRN [id number]  PCP:  Patrick Orn, MD   [facility] Providers Cardiologist:  Patrick Chandler, MD Electrophysiologist:  Patrick Grayer, MD    Referring MD: Patrick Orn, MD   Chief Complaint:  f/u for CAD, CHF, AFib    Patient Profile:   Patrick Franco is a 86 y.o. male with:  Coronary artery disease CT 12/2017: LM and 3 v coronary atherosclerosis  Myoview 03/2017: no ischemia  Aortic stenosis, moderate   Echocardiogram 02/2020: EF 60-65, mild AS (mean 17 mmHg), mild MVP w mild MR Echocardiogram 9/22: mean 26.5, Vmax 373 cm/s, DI 0.76 Diastolic CHF Atrial Fibrillation/Flutter New onset 02/2020 >> c/b acute diastolic CHF; admitted to [facility] S/p TEE-DCCV 03/2020 Dofetilide started 6.2021 Hypertension  MVP Mild MR Echocardiogram 02/2020 Sleep apnea  PACs Aortic atherosclerosis (CT 12/2017)   Tracheal malacia    Prior CV studies: Echocardiogram 07/16/21 EF 60-65, no RWMA, mild LVH, GR 1 DD, normal RVSF, severe LAE, mild MR, severe MAC, moderate aortic stenosis (mean 26.5 mmHg, V-max 373 cm/s, DI 0.27)  TEE 03/27/20 EF 60-65, normal RVSF, mild RAE, severe LAE, mod MR, mild AI, mild dilation of asc Ao   Echocardiogram 03/05/2020 EF 60-65, no RWMA, severe LVH, normal RVSF, severe LAE, mod RAE, mild MVP with mild MR, mild AI, mild AS (mean 17 mmHg).     Myoview 04/07/17 EF 61, no ischemia or infarction.  Low Risk    History of Present Illness: Patrick Franco was last seen by Dr. Rayann Franco in 8/22.  He had symptoms of shortness of breath.  He was maintaining normal sinus rhythm.  His NT-Pro BNP was normal.  An echocardiogram demonstrated normal EF and mod AS.  He returns for f/u.  He is here with his wife.  We discussed the findings of his echocardiogram.  He continues to have issues with shortness of breath with exertion.  Of note, he does have spinal stenosis as well as tracheomalacia.  He only gets short of  breath when he walks.  If he does exercises in the pool rides a bike, his breathing is fairly stable.  He has not had chest pain, syncope, orthopnea or significant leg edema.        Past Medical History:  Diagnosis Date   Aortic stenosis    Arthritis    Atrial fibrillation (HCC)    BPH (benign prostatic hyperplasia)    Chronic low back pain    Depression    Diverticulitis    Elevated PSA    Foot drop, right    Gait disorder 02/03/2015   Hip pain    History of colonoscopy    HOH (hard of hearing)    hearing aid   Hypertension    Lumbar radiculopathy    Mitral valve prolapse    Onychomycosis    Peptic ulcer disease    Sepsis ([place]) 2005   e. coli-after prostate bx   Sleep apnea    Torn ACL    Tracheobronchomalacia    seen on 2019 Chest CT   Current Medications: Current Meds  Medication Sig   allopurinol (ZYLOPRIM) 100 MG tablet TAKE 1 TABLET ONCE DAILY.   apixaban (ELIQUIS) 5 MG TABS tablet Take 1 tablet (5 mg total) by mouth 2 (two) times daily.   atorvastatin (LIPITOR) 10 MG tablet Take 10 mg by mouth daily.    COVID-19 mRNA vaccine, Moderna, 100 MCG/0.5ML injection  Inject into the muscle.   diltiazem (CARDIZEM CD) 240 MG 24 hr capsule TAKE (1) CAPSULE DAILY.   dofetilide (TIKOSYN) 500 MCG capsule Take 1 capsule (500 mcg total) by mouth 2 (two) times daily.   dutasteride (AVODART) 0.5 MG capsule Take 0.5 mg by mouth daily.   furosemide (LASIX) 40 MG tablet Taking 1.5 tablets by mouth daily   metoprolol tartrate (LOPRESSOR) 25 MG tablet Take 1 tablet (25 mg total) by mouth daily.   mirtazapine (REMERON) 30 MG tablet TAKE ONE TABLET AT BEDTIME.   Multiple Vitamin (MULTIVITAMIN) tablet Take 1 tablet by mouth daily.   Omega-3 Fatty Acids (FISH OIL PO) Take 1,200 mg by mouth daily.    Polyethyl Glycol-Propyl Glycol (SYSTANE) 0.4-0.3 % SOLN Place 1 drop into both eyes in the morning, at noon, and at bedtime.   potassium chloride SA (KLOR-CON) 20 MEQ tablet TAKE 1 TABLET BY MOUTH  IN THE MORNING, TAKE 1/2 TABLET BY MOUTH IN THE P.M.   Probiotic Product (PROBIOTIC DAILY PO) Take 1 tablet by mouth daily.   pyridOXINE (VITAMIN B-6) 100 MG tablet Take 100 mg by mouth daily.   Respiratory Therapy Supplies (FLUTTER) DEVI Use the flutter valve 10 times when doing it daily.   spironolactone (ALDACTONE) 25 MG tablet Take 0.5 tablets (12.5 mg total) by mouth daily.   tamsulosin (FLOMAX) 0.4 MG CAPS capsule Take 0.4 mg by mouth daily.   telmisartan (MICARDIS) 20 MG tablet Take 20 mg by mouth daily.   Turmeric 500 MG CAPS Take 500 mg by mouth daily.   vitamin B-12 (CYANOCOBALAMIN) 1000 MCG tablet Take 1,000 mcg by mouth daily.   [DISCONTINUED] potassium chloride (KLOR-CON) 20 MEQ tablet Take 1 tablet (20 mEq total) by mouth 2 (two) times daily.    Allergies:   Patient has no known allergies.   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  Review of Systems  Gastrointestinal:  Negative for hematochezia.    EKGs/Labs/Other Test Reviewed:    EKG:  EKG is   ordered today.  The ekg ordered today demonstrates sinus bradycardia, HR 59, normal axis, anteroseptal Q waves, QTC 479  Recent Labs: 06/28/2021: BUN 24; Creatinine, Ser 1.05; Magnesium 2.0; NT-Pro BNP 246; Potassium 4.4; Sodium 140   Recent Lipid Panel Lab Results  Component Value Date/Time   CHOL 96 03/06/2020 03:30 AM   TRIG 50 03/06/2020  03:30 AM   HDL 48 03/06/2020 03:30 AM   LDLCALC 38 03/06/2020 03:30 AM     Risk Assessment/Calculations:    CHA2DS2-VASc Score = 5   This indicates a 7.2% annual risk of stroke. The patient's score is based upon: CHF History: 1 HTN History: 1 Diabetes History: 0 Stroke History: 0 Vascular Disease  History: 1 Age Score: 2 Gender Score: 0        Physical Exam:    VS:  BP 140/80   Pulse (!) 59   Ht 5' 9.5" (1.765 m)   Wt 218 lb 9.6 oz (99.2 kg)   SpO2 97%   BMI 31.82 kg/m     Wt Readings from Last 3 Encounters:  08/10/21 218 lb 9.6 oz (99.2 kg)  06/28/21 214 lb 6.4 oz (97.3 kg)  12/16/20 210 lb 9.6 oz (95.5 kg)    Constitutional:      Appearance: Healthy appearance. Not in distress.  Neck:     Vascular: JVD normal.  Pulmonary:     Effort: Pulmonary effort is normal.     Breath sounds: No wheezing. No rales.  Cardiovascular:     Normal rate. Regular rhythm. Normal S1. Normal S2.      Murmurs: There is a grade 3/6 systolic murmur at the URSB.  Edema:    Peripheral edema absent.  Abdominal:     Palpations: Abdomen is soft.  Skin:    General: Skin is warm and dry.  Neurological:     General: No focal deficit present.     Mental Status: Alert and oriented to person, place and time.     Cranial Nerves: Cranial nerves are intact.       ASSESSMENT & PLAN:   1. Coronary artery disease involving native coronary artery of native heart without angina pectoris 2. Shortness of breath History of coronary calcification by CT scan.  Myoview in 2018 was low risk.  He has not had chest pain.  However, he does have shortness of breath with exertion.  I suspect his shortness of breath is multifactorial and related to deconditioning from spinal stenosis as well as tracheomalacia in the setting of heart failure, atrial fibrillation, coronary disease.  His recent echocardiogram demonstrates normal LV function.  He does have moderate aortic stenosis but this is not likely contributing  to his shortness of breath at this time.  Recent BNP was also normal.  His exam does not suggest volume excess.  I have recommended proceeding with stress testing to rule out the possibility of ischemia for his shortness of breath.  As long as this is low risk, he will not need further ischemic testing at this time.  -Lexiscan Myoview  -Continue atorvastatin 10 mg daily  3. Chronic heart failure with preserved ejection fraction (HCC) EF 60-65 by echocardiogram 07/16/2021.  He does have mild diastolic dysfunction.  We discussed the pathophysiology of diastolic heart failure.  As noted, his volume status appears to be stable.  His recent BNP was also normal.  His blood pressure is out of control.  I have recommended starting him on spironolactone 12.5 mg daily.  I will reduce his potassium to 20 mEq in the morning and 10 mEq in the evening.  He will need a BMET every week x2.  Continue furosemide 60 mg daily.  We could consider addition of SGLT2 inhibitor in the future.  4. Moderate aortic stenosis As noted, recent echocardiogram demonstrates mean gradient 26.5.  He will need a follow-up echocardiogram in 1 year.  I will review further with Dr. Angelena Form to see if we should repeat his echocardiogram in 6 months instead.  5. Persistent atrial fibrillation ([place]) He remains in sinus rhythm.  He is tolerating anticoagulation.  Continue apixaban 5 mg twice daily.  Recent creatinine normal.  I will obtain a follow-up CBC with one of his  upcoming BMET's.  He remains on dofetilide.  Continue dofetilide 500 mcg twice daily, diltiazem 240 mg daily, metoprolol tartrate 25 mg daily.  Follow-up with EP as planned.  6. Essential hypertension Blood pressure is uncontrolled.  Add spironolactone 12.5 mg daily as noted.  Continue diltiazem 240 mg daily, metoprolol tartrate 25 mg daily.  We will contact his PCPs office to find out why he was taken off of telmisartan.  If he has no contraindications to ACE/ARB, we can  certainly consider this again in the future for his blood pressure.  7. Hyperlipidemia, unspecified hyperlipidemia type Continue atorvastatin 10 mg daily.  Recent LDL 42.  Shared Decision Making/Informed Consent The risks [chest pain, shortness of breath, cardiac arrhythmias, dizziness, blood pressure fluctuations, myocardial infarction, stroke/transient ischemic attack, nausea, vomiting, allergic reaction, radiation exposure, metallic taste sensation and life-threatening complications (estimated to be 1 in 10,000)], benefits (risk stratification, diagnosing coronary artery disease, treatment guidance) and alternatives of a nuclear stress test were discussed in detail with Mr. Covello and he agrees to proceed.   Dispo:  Return in about 3 months (around 11/09/2021) for Routine Follow Up with Dr. Angelena Form.   Medication Adjustments/Labs and Tests Ordered: Current medicines are reviewed at length with the patient today.  Concerns regarding medicines are outlined above.  Tests Ordered: Orders Placed This Encounter  Procedures   Basic metabolic panel   Basic metabolic panel   CBC   Cardiac Stress Test: Informed Consent Details: Physician/Practitioner Attestation; Transcribe to consent form and obtain patient signature   MYOCARDIAL PERFUSION IMAGING   EKG 12-Lead   Medication Changes: Meds ordered this encounter  Medications   spironolactone (ALDACTONE) 25 MG tablet    Sig: Take 0.5 tablets (12.5 mg total) by mouth daily.    Dispense:  45 tablet    Refill:  3   potassium chloride SA (KLOR-CON) 20 MEQ tablet    Sig: TAKE 1 TABLET BY MOUTH IN THE MORNING, TAKE 1/2 TABLET BY MOUTH IN THE P.M.    Dispense:  135 tablet    Refill:  3    Signed, Richardson Dopp, PA-C  08/10/2021 5:08 PM    [facility] [place], [place], [place]  [address] Phone: [phone]; Fax: [phone]

## 2021-08-10 ENCOUNTER — Encounter: Payer: Self-pay | Admitting: Physician Assistant

## 2021-08-10 ENCOUNTER — Other Ambulatory Visit: Payer: Self-pay

## 2021-08-10 ENCOUNTER — Ambulatory Visit (INDEPENDENT_AMBULATORY_CARE_PROVIDER_SITE_OTHER): Payer: Medicare Other | Admitting: Physician Assistant

## 2021-08-10 VITALS — BP 140/80 | HR 59 | Ht 69.5 in | Wt 218.6 lb

## 2021-08-10 DIAGNOSIS — I4819 Other persistent atrial fibrillation: Secondary | ICD-10-CM | POA: Diagnosis not present

## 2021-08-10 DIAGNOSIS — I480 Paroxysmal atrial fibrillation: Secondary | ICD-10-CM | POA: Diagnosis not present

## 2021-08-10 DIAGNOSIS — I5030 Unspecified diastolic (congestive) heart failure: Secondary | ICD-10-CM | POA: Diagnosis not present

## 2021-08-10 DIAGNOSIS — I350 Nonrheumatic aortic (valve) stenosis: Secondary | ICD-10-CM

## 2021-08-10 DIAGNOSIS — I2510 Atherosclerotic heart disease of native coronary artery without angina pectoris: Secondary | ICD-10-CM

## 2021-08-10 DIAGNOSIS — I5032 Chronic diastolic (congestive) heart failure: Secondary | ICD-10-CM | POA: Diagnosis not present

## 2021-08-10 DIAGNOSIS — I10 Essential (primary) hypertension: Secondary | ICD-10-CM

## 2021-08-10 DIAGNOSIS — F329 Major depressive disorder, single episode, unspecified: Secondary | ICD-10-CM | POA: Diagnosis not present

## 2021-08-10 DIAGNOSIS — E785 Hyperlipidemia, unspecified: Secondary | ICD-10-CM | POA: Diagnosis not present

## 2021-08-10 DIAGNOSIS — I200 Unstable angina: Secondary | ICD-10-CM

## 2021-08-10 DIAGNOSIS — R0602 Shortness of breath: Secondary | ICD-10-CM | POA: Diagnosis not present

## 2021-08-10 DIAGNOSIS — J479 Bronchiectasis, uncomplicated: Secondary | ICD-10-CM | POA: Diagnosis not present

## 2021-08-10 DIAGNOSIS — K219 Gastro-esophageal reflux disease without esophagitis: Secondary | ICD-10-CM | POA: Diagnosis not present

## 2021-08-10 DIAGNOSIS — I5031 Acute diastolic (congestive) heart failure: Secondary | ICD-10-CM | POA: Diagnosis not present

## 2021-08-10 DIAGNOSIS — N401 Enlarged prostate with lower urinary tract symptoms: Secondary | ICD-10-CM | POA: Diagnosis not present

## 2021-08-10 MED ORDER — SPIRONOLACTONE 25 MG PO TABS: 12.5000 mg | ORAL_TABLET | Freq: Every day | ORAL | 3 refills | Status: DC

## 2021-08-10 MED ORDER — POTASSIUM CHLORIDE CRYS ER 20 MEQ PO TBCR: EXTENDED_RELEASE_TABLET | ORAL | 3 refills | Status: DC

## 2021-08-10 NOTE — Patient Instructions (Addendum)
Medication Instructions:  Your physician has recommended you make the following change in your medication:   START Spironolactone 12.5 mg taking 1 daily  REDUCE the Potassium to 20 in the a.m and 1/2 tablet in p.m   *If you need a refill on your cardiac medications before your next appointment, please call your pharmacy*   Lab Work: 1 WEEK:  BMET 2 WEEKS:  BMET   If you have labs (blood work) drawn today and your tests are completely normal, you will receive your results only by: [employer] (if you have MyChart) OR A paper copy in the mail If you have any lab test that is abnormal or we need to change your treatment, we will call you to review the results.   Testing/Procedures: Your physician has requested that you have a lexiscan myoview. For further information please visit [email or web address]. Please follow instruction sheet BELOW:    You are scheduled for a Myocardial Perfusion Imaging Study  Please arrive 15 minutes prior to your appointment time for registration and insurance purposes.  The test will take approximately 3 to 4 hours to complete; you may bring reading material.  If someone comes with you to your appointment, they will need to remain in the main lobby due to limited space in the testing area. **If you are pregnant or breastfeeding, please notify the nuclear lab prior to your appointment**  How to prepare for your Myocardial Perfusion Test: Do not eat or drink 3 hours prior to your test, except you may have water. Do not consume products containing caffeine (regular or decaffeinated) 12 hours prior to your test. (ex: coffee, chocolate, sodas, tea). Do bring a list of your current medications with you.  If not listed below, you may take your medications as normal. Do wear comfortable clothes (no dresses or overalls) and walking shoes, tennis shoes preferred (No heels or open toe shoes are allowed). Do NOT wear cologne, perfume, aftershave, or lotions  (deodorant is allowed). If these instructions are not followed, your test will have to be rescheduled.      Follow-Up: At [facility], you and your health needs are our priority.  As part of our continuing mission to provide you with exceptional heart care, we have created designated Provider Care Teams.  These Care Teams include your primary Cardiologist (physician) and Advanced Practice Providers (APPs -  Physician Assistants and Nurse Practitioners) who all work together to provide you with the care you need, when you need it.  We recommend signing up for the patient portal called "MyChart".  Sign up information is provided on this After Visit Summary.  MyChart is used to connect with patients for Virtual Visits (Telemedicine).  Patients are able to view lab/test results, encounter notes, upcoming appointments, etc.  Non-urgent messages can be sent to your provider as well.   To learn more about what you can do with MyChart, go to [email or web address].    Your next appointment:   3 month(s)  The format for your next appointment:   In Person  Provider:   You may see [name], MD or one of the following Advanced Practice Providers on your designated Care Team:   Melina Copa, PA-C Ermalinda Barrios, PA-C   Other Instructions

## 2021-08-12 ENCOUNTER — Telehealth: Payer: Self-pay | Admitting: Physician Assistant

## 2021-08-12 DIAGNOSIS — R351 Nocturia: Secondary | ICD-10-CM | POA: Diagnosis not present

## 2021-08-12 DIAGNOSIS — N403 Nodular prostate with lower urinary tract symptoms: Secondary | ICD-10-CM | POA: Diagnosis not present

## 2021-08-12 DIAGNOSIS — N401 Enlarged prostate with lower urinary tract symptoms: Secondary | ICD-10-CM | POA: Diagnosis not present

## 2021-08-12 DIAGNOSIS — H903 Sensorineural hearing loss, bilateral: Secondary | ICD-10-CM | POA: Diagnosis not present

## 2021-08-12 DIAGNOSIS — Z822 Family history of deafness and hearing loss: Secondary | ICD-10-CM | POA: Diagnosis not present

## 2021-08-12 DIAGNOSIS — Z77122 Contact with and (suspected) exposure to noise: Secondary | ICD-10-CM | POA: Diagnosis not present

## 2021-08-12 NOTE — Telephone Encounter (Signed)
Please call patient I reviewed his echocardiogram with Dr. Angelena Form. We agree that his f/u echocardiogram should be in 1 year.   Please make sure he has a f/u echocardiogram for aortic stenosis in Sept 2023. Thanks Richardson Dopp, PA-C    08/12/2021 2:45 PM

## 2021-08-13 ENCOUNTER — Other Ambulatory Visit: Payer: Self-pay | Admitting: *Deleted

## 2021-08-13 ENCOUNTER — Telehealth: Payer: Self-pay | Admitting: Cardiovascular Disease

## 2021-08-13 DIAGNOSIS — I350 Nonrheumatic aortic (valve) stenosis: Secondary | ICD-10-CM

## 2021-08-13 DIAGNOSIS — I10 Essential (primary) hypertension: Secondary | ICD-10-CM | POA: Diagnosis not present

## 2021-08-13 NOTE — Telephone Encounter (Signed)
Sent pt mychart message and scheduled echo.

## 2021-08-13 NOTE — Telephone Encounter (Signed)
Blanch Media noted that Patrick Franco wanted to know if the patient was taking telmisartan. Blanch Media stated that they started him on this medication in February of 2021 and in their records he was in the hospital that April and it was discontinued at discharge. No indication of reaction or allergy. The last time they filled the drug was in April 2021.

## 2021-08-13 NOTE — Telephone Encounter (Signed)
Left message to call back  

## 2021-08-13 NOTE — Telephone Encounter (Signed)
Blanch Media is calling from Dr. Wonda Amis office stating she is returning [employer] call. Please advise.

## 2021-08-16 ENCOUNTER — Telehealth (HOSPITAL_COMMUNITY): Payer: Self-pay

## 2021-08-16 NOTE — Telephone Encounter (Signed)
Spoke with the patient, detailed instructions given. He stated that he would be here for his test. Asked to call back with any questions. S. EMTP 

## 2021-08-17 NOTE — Telephone Encounter (Signed)
I think this was routed to me in error. Patient follows with Nicki Reaper, already routed to him as below.

## 2021-08-19 ENCOUNTER — Other Ambulatory Visit: Payer: Self-pay

## 2021-08-19 ENCOUNTER — Ambulatory Visit (HOSPITAL_COMMUNITY): Payer: Medicare Other | Attending: Cardiology

## 2021-08-19 ENCOUNTER — Other Ambulatory Visit: Payer: Medicare Other

## 2021-08-19 DIAGNOSIS — I5032 Chronic diastolic (congestive) heart failure: Secondary | ICD-10-CM | POA: Diagnosis not present

## 2021-08-19 DIAGNOSIS — I10 Essential (primary) hypertension: Secondary | ICD-10-CM

## 2021-08-19 DIAGNOSIS — E785 Hyperlipidemia, unspecified: Secondary | ICD-10-CM

## 2021-08-19 DIAGNOSIS — I200 Unstable angina: Secondary | ICD-10-CM | POA: Insufficient documentation

## 2021-08-19 DIAGNOSIS — I4819 Other persistent atrial fibrillation: Secondary | ICD-10-CM

## 2021-08-19 DIAGNOSIS — R0602 Shortness of breath: Secondary | ICD-10-CM | POA: Diagnosis not present

## 2021-08-19 DIAGNOSIS — I350 Nonrheumatic aortic (valve) stenosis: Secondary | ICD-10-CM

## 2021-08-19 DIAGNOSIS — I2510 Atherosclerotic heart disease of native coronary artery without angina pectoris: Secondary | ICD-10-CM | POA: Diagnosis not present

## 2021-08-19 LAB — BASIC METABOLIC PANEL
BUN/Creatinine Ratio: 21 (ref 10–24)
BUN: 21 mg/dL (ref 8–27)
CO2: 24 mmol/L (ref 20–29)
Calcium: 9.5 mg/dL (ref 8.6–10.2)
Chloride: 103 mmol/L (ref 96–106)
Creatinine, Ser: 0.98 mg/dL (ref 0.76–1.27)
Glucose: 121 mg/dL — ABNORMAL HIGH (ref 70–99)
Potassium: 4.6 mmol/L (ref 3.5–5.2)
Sodium: 141 mmol/L (ref 134–144)
eGFR: 75 mL/min/{1.73_m2} (ref >59)

## 2021-08-19 LAB — CBC
Hematocrit: 43.9 % (ref 37.5–51.0)
Hemoglobin: 14.9 g/dL (ref 13.0–17.7)
MCH: 33.6 pg — ABNORMAL HIGH (ref 26.6–33.0)
MCHC: 33.9 g/dL (ref 31.5–35.7)
MCV: 99 fL — ABNORMAL HIGH (ref 79–97)
Platelets: 199 10*3/uL (ref 150–450)
RBC: 4.43 x10E6/uL (ref 4.14–5.80)
RDW: 12.8 % (ref 11.6–15.4)
WBC: 5.8 10*3/uL (ref 3.4–10.8)

## 2021-08-19 LAB — MYOCARDIAL PERFUSION IMAGING
Base ST Depression (mm): 0 mm
LV dias vol: 136 mL (ref 62–150)
LV sys vol: 45 mL
Nuc Stress EF: 67 %
Peak HR: 89 {beats}/min
Rest HR: 76 {beats}/min
Rest Nuclear Isotope Dose: 10.9 mCi
SDS: 0
SRS: 0
SSS: 0
ST Depression (mm): 0 mm
Stress Nuclear Isotope Dose: 30.8 mCi
TID: 1.12

## 2021-08-19 MED ORDER — REGADENOSON 0.4 MG/5ML IV SOLN
0.4000 mg | Freq: Once | INTRAVENOUS | Status: AC
  Administered 2021-08-19: 0.4 mg via INTRAVENOUS

## 2021-08-19 MED ORDER — TECHNETIUM TC 99M TETROFOSMIN IV KIT
30.8000 | PACK | Freq: Once | INTRAVENOUS | Status: AC | PRN
  Administered 2021-08-19: 30.8 via INTRAVENOUS
  Filled 2021-08-19: qty 31

## 2021-08-19 MED ORDER — TECHNETIUM TC 99M TETROFOSMIN IV KIT
10.9000 | PACK | Freq: Once | INTRAVENOUS | Status: AC | PRN
  Administered 2021-08-19: 10.9 via INTRAVENOUS
  Filled 2021-08-19: qty 11

## 2021-08-20 ENCOUNTER — Encounter: Payer: Self-pay | Admitting: Physician Assistant

## 2021-08-26 ENCOUNTER — Other Ambulatory Visit: Payer: Self-pay

## 2021-08-26 ENCOUNTER — Other Ambulatory Visit: Payer: Medicare Other

## 2021-08-26 DIAGNOSIS — R0602 Shortness of breath: Secondary | ICD-10-CM

## 2021-08-26 DIAGNOSIS — I10 Essential (primary) hypertension: Secondary | ICD-10-CM | POA: Diagnosis not present

## 2021-08-26 DIAGNOSIS — I200 Unstable angina: Secondary | ICD-10-CM | POA: Diagnosis not present

## 2021-08-26 DIAGNOSIS — I5032 Chronic diastolic (congestive) heart failure: Secondary | ICD-10-CM | POA: Diagnosis not present

## 2021-08-26 DIAGNOSIS — I4819 Other persistent atrial fibrillation: Secondary | ICD-10-CM

## 2021-08-26 DIAGNOSIS — I350 Nonrheumatic aortic (valve) stenosis: Secondary | ICD-10-CM

## 2021-08-26 DIAGNOSIS — I2510 Atherosclerotic heart disease of native coronary artery without angina pectoris: Secondary | ICD-10-CM

## 2021-08-26 DIAGNOSIS — E785 Hyperlipidemia, unspecified: Secondary | ICD-10-CM | POA: Diagnosis not present

## 2021-08-26 LAB — BASIC METABOLIC PANEL
BUN/Creatinine Ratio: 26 — ABNORMAL HIGH (ref 10–24)
BUN: 26 mg/dL (ref 8–27)
CO2: 22 mmol/L (ref 20–29)
Calcium: 9.6 mg/dL (ref 8.6–10.2)
Chloride: 100 mmol/L (ref 96–106)
Creatinine, Ser: 0.99 mg/dL (ref 0.76–1.27)
Glucose: 160 mg/dL — ABNORMAL HIGH (ref 70–99)
Potassium: 4.6 mmol/L (ref 3.5–5.2)
Sodium: 138 mmol/L (ref 134–144)
eGFR: 74 mL/min/{1.73_m2} (ref >59)

## 2021-09-03 DIAGNOSIS — Z23 Encounter for immunization: Secondary | ICD-10-CM | POA: Diagnosis not present

## 2021-09-16 ENCOUNTER — Ambulatory Visit: Payer: Medicare Other | Attending: Internal Medicine

## 2021-09-16 ENCOUNTER — Other Ambulatory Visit (HOSPITAL_BASED_OUTPATIENT_CLINIC_OR_DEPARTMENT_OTHER): Payer: Self-pay

## 2021-09-16 DIAGNOSIS — Z23 Encounter for immunization: Secondary | ICD-10-CM

## 2021-09-16 MED ORDER — MODERNA COVID-19 BIVAL BOOSTER 50 MCG/0.5ML IM SUSP
INTRAMUSCULAR | 0 refills | Status: DC
  Filled 2021-09-16: qty 0.5, 1d supply, fill #0

## 2021-09-16 NOTE — Progress Notes (Signed)
   Covid-19 Vaccination Clinic  Name:  Patrick Franco    MRN: [id number] DOB: [birth year]  09/16/2021  Patrick Franco was observed post Covid-19 immunization for 15 minutes without incident. He was provided with Vaccine Information Sheet and instruction to access the V-Safe system.   Patrick Franco was instructed to call 911 with any severe reactions post vaccine: Difficulty breathing  Swelling of face and throat  A fast heartbeat  A bad rash all over body  Dizziness and weakness   Immunizations Administered     Name Date Dose VIS Date Route   Moderna Covid-19 vaccine Bivalent Booster 09/16/2021 11:10 AM 0.5 mL 06/26/2021 Intramuscular   Manufacturer: Moderna   Lot: [id number]   [place]: [id number]

## 2021-09-19 ENCOUNTER — Other Ambulatory Visit: Payer: Self-pay | Admitting: Physician Assistant

## 2021-09-22 DIAGNOSIS — R0789 Other chest pain: Secondary | ICD-10-CM | POA: Diagnosis not present

## 2021-09-22 DIAGNOSIS — I10 Essential (primary) hypertension: Secondary | ICD-10-CM | POA: Diagnosis not present

## 2021-09-22 DIAGNOSIS — Z5181 Encounter for therapeutic drug level monitoring: Secondary | ICD-10-CM | POA: Diagnosis not present

## 2021-09-22 DIAGNOSIS — K921 Melena: Secondary | ICD-10-CM | POA: Diagnosis not present

## 2021-09-22 DIAGNOSIS — I480 Paroxysmal atrial fibrillation: Secondary | ICD-10-CM | POA: Diagnosis not present

## 2021-09-22 DIAGNOSIS — J479 Bronchiectasis, uncomplicated: Secondary | ICD-10-CM | POA: Diagnosis not present

## 2021-09-22 DIAGNOSIS — I4891 Unspecified atrial fibrillation: Secondary | ICD-10-CM | POA: Diagnosis not present

## 2021-09-22 DIAGNOSIS — D509 Iron deficiency anemia, unspecified: Secondary | ICD-10-CM | POA: Diagnosis not present

## 2021-09-22 DIAGNOSIS — R350 Frequency of micturition: Secondary | ICD-10-CM | POA: Diagnosis not present

## 2021-09-22 DIAGNOSIS — F32A Depression, unspecified: Secondary | ICD-10-CM | POA: Diagnosis not present

## 2021-09-22 DIAGNOSIS — K219 Gastro-esophageal reflux disease without esophagitis: Secondary | ICD-10-CM | POA: Diagnosis not present

## 2021-09-22 DIAGNOSIS — F329 Major depressive disorder, single episode, unspecified: Secondary | ICD-10-CM | POA: Diagnosis not present

## 2021-09-22 DIAGNOSIS — N401 Enlarged prostate with lower urinary tract symptoms: Secondary | ICD-10-CM | POA: Diagnosis not present

## 2021-09-22 DIAGNOSIS — R079 Chest pain, unspecified: Secondary | ICD-10-CM | POA: Diagnosis not present

## 2021-09-22 DIAGNOSIS — R0902 Hypoxemia: Secondary | ICD-10-CM | POA: Diagnosis not present

## 2021-09-23 ENCOUNTER — Other Ambulatory Visit: Payer: Self-pay

## 2021-09-23 ENCOUNTER — Telehealth: Payer: Self-pay | Admitting: Internal Medicine

## 2021-09-23 ENCOUNTER — Encounter (HOSPITAL_COMMUNITY): Payer: Self-pay | Admitting: Nurse Practitioner

## 2021-09-23 ENCOUNTER — Ambulatory Visit (HOSPITAL_COMMUNITY)
Admission: RE | Admit: 2021-09-23 | Discharge: 2021-09-23 | Disposition: A | Discharge status: Home / Self Care | Payer: Medicare Other | Source: Ambulatory Visit | Attending: Nurse Practitioner | Admitting: Nurse Practitioner

## 2021-09-23 VITALS — BP 128/72 | HR 68 | Ht 69.5 in | Wt 208.8 lb

## 2021-09-23 DIAGNOSIS — I350 Nonrheumatic aortic (valve) stenosis: Secondary | ICD-10-CM | POA: Diagnosis not present

## 2021-09-23 DIAGNOSIS — D649 Anemia, unspecified: Secondary | ICD-10-CM

## 2021-09-23 DIAGNOSIS — R0602 Shortness of breath: Secondary | ICD-10-CM | POA: Insufficient documentation

## 2021-09-23 DIAGNOSIS — Z87891 Personal history of nicotine dependence: Secondary | ICD-10-CM | POA: Insufficient documentation

## 2021-09-23 NOTE — Patient Instructions (Signed)
If feel back in afib - check blood pressure/heart rate -- IF top number of blood pressure is over 100 and pulse is over 100 can take an extra 25mg  of metoprolol.  If in afib but blood pressures are low and you are symptomatic then proceed to ER

## 2021-09-23 NOTE — Telephone Encounter (Signed)
Offered patient 3 pm appointment at the [facility] today. Verbalized he could make the appointment and appreciative for the fast assistance.

## 2021-09-23 NOTE — Telephone Encounter (Signed)
Spoke with Patrick Franco who denies current CP or worsening SOB.  Patrick Franco states he does feel he is back at his baseline.  Advised of Trinidad Curet recommendation to see if we can get him into the AFib clinic this PM or tomorrow to evaluate for recurrent atrial fibrillation.  Will contact Afib clinic to check availability and call Patrick Franco with appointment date and time once obtained.  Patrick Franco verbalizes understanding and agrees with current plan.

## 2021-09-23 NOTE — Progress Notes (Signed)
Primary Care Physician: Lavone Orn, MD Referring Physician: Richardson Dopp, PA Cardiologist/EP: Dr. Fara Boros Patrick Franco is a 87 y.o. male with a h/o HTN, previous gastric ulcer with hemorrhage, moderate aortic stenosis, h/o CHF, CAD, afib on Tikosyn. He is in the afib clinic as he had a spell of dyspnea at bedtime last night and called EMS. He had just went to bed and was putting  on his CPAP and felt winded. He sat up and tried CPAP again and became short of breath again told his wife to call EMS. The fire department was there in a few minutes when he was still feeling short of breath and placed on monitor which showed SR. EMS arrived, EKG was done and it showed SR as well. His V/S were normal. It was not advised for him to go to the ER. He immediately felt better.  He  states that he may have panicked a little last night.  Episode lasted 25-30 mins. He also states that he has not felt well for the last few months. His PCP called this am to tell him that his HGB has dropped to 10.3 from 14.9 10/22 and is trying to get GI appointment asap. He has noted black stools.  He had a stress test 10/6 which was low risk and an echo in September did show progression of aortic stenosis from mild to moderate.   He is in SR today with stable BP. His weight today is 208 lbs and it was 218 lbs in September.    Today, he denies symptoms of palpitations, chest pain, shortness of breath, orthopnea, PND, lower extremity edema, dizziness, presyncope, syncope, or neurologic sequela. The patient is tolerating medications without difficulties and is otherwise without complaint today.   Past Medical History:  Diagnosis Date   Aortic stenosis    Arthritis    Atrial fibrillation (HCC)    BPH (benign prostatic hyperplasia)    Chronic low back pain    Depression    Diverticulitis    Elevated PSA    Foot drop, right    Gait disorder 02/03/2015   Hip pain    History of colonoscopy    History of nuclear stress  test    Myoview 10/22: EF 67, no ischemia or infarction; low risk   HOH (hard of hearing)    hearing aid   Hypertension    Lumbar radiculopathy    Mitral valve prolapse    Onychomycosis    Peptic ulcer disease    Sepsis ([place]) 2005   e. coli-after prostate bx   Sleep apnea    Torn ACL    Tracheobronchomalacia    seen on 2019 Chest CT   Past Surgical History:  Procedure Laterality Date   CARDIOVERSION N/A 03/27/2020   Procedure: CARDIOVERSION;  Surgeon: Skeet Latch, MD;  Location: [facility];  Service: Cardiovascular;  Laterality: N/A;   CATARACT EXTRACTION Bilateral    ESOPHAGOGASTRODUODENOSCOPY N/A 08/19/2015   Procedure: ESOPHAGOGASTRODUODENOSCOPY (EGD);  Surgeon: Laurence Spates, MD;  Location: [facility] ENDOSCOPY;  Service: Endoscopy;  Laterality: N/A;   right knee replacement Right 1996   SHOULDER SURGERY     TEE WITHOUT CARDIOVERSION N/A 03/27/2020   Procedure: TRANSESOPHAGEAL ECHOCARDIOGRAM (TEE);  Surgeon: Skeet Latch, MD;  Location: [facility];  Service: Cardiovascular;  Laterality: N/A;   TONSILLECTOMY     TOTAL HIP ARTHROPLASTY Right 08/13/2019   Procedure: RIGHT TOTAL HIP ARTHROPLASTY ANTERIOR APPROACH;  Surgeon: Mcarthur Rossetti, MD;  Location: [facility];  Service: Orthopedics;  Laterality: Right;    Current Outpatient Medications  Medication Sig Dispense Refill   allopurinol (ZYLOPRIM) 100 MG tablet TAKE 1 TABLET ONCE DAILY. 90 tablet 0   apixaban (ELIQUIS) 5 MG TABS tablet Take 1 tablet (5 mg total) by mouth 2 (two) times daily. 180 tablet 3   atorvastatin (LIPITOR) 10 MG tablet Take 10 mg by mouth daily.   10   COVID-19 mRNA bivalent vaccine, Moderna, (MODERNA COVID-19 BIVAL BOOSTER) 50 MCG/0.5ML injection Inject into the muscle. 0.5 mL 0   COVID-19 mRNA vaccine, Moderna, 100 MCG/0.5ML injection Inject into the muscle. 0.25 mL 0   diltiazem (CARDIZEM CD) 240 MG 24 hr capsule TAKE (1) CAPSULE DAILY. 30 capsule 6   dofetilide (TIKOSYN) 500 MCG capsule Take 1  capsule (500 mcg total) by mouth 2 (two) times daily. 180 capsule 3   dutasteride (AVODART) 0.5 MG capsule Take 0.5 mg by mouth daily.  2   furosemide (LASIX) 40 MG tablet Take 1.5 tablets (60 mg total) by mouth daily. TAKE 1 TABLET TWICE DAILY FOR 3 DAYS THEN TAKE 1&1/2 TABLETS DAILY THEREAFTER. 135 tablet 3   metoprolol tartrate (LOPRESSOR) 25 MG tablet Take 1 tablet (25 mg total) by mouth daily. 90 tablet 3   mirtazapine (REMERON) 30 MG tablet TAKE ONE TABLET AT BEDTIME. 90 tablet 0   Multiple Vitamin (MULTIVITAMIN) tablet Take 1 tablet by mouth daily.     Omega-3 Fatty Acids (FISH OIL PO) Take 1,200 mg by mouth daily.      Polyethyl Glycol-Propyl Glycol (SYSTANE) 0.4-0.3 % SOLN Place 1 drop into both eyes in the morning, at noon, and at bedtime.     potassium chloride SA (KLOR-CON) 20 MEQ tablet TAKE 1 TABLET BY MOUTH IN THE MORNING, TAKE 1/2 TABLET BY MOUTH IN THE P.M. 135 tablet 3   Probiotic Product (PROBIOTIC DAILY PO) Take 1 tablet by mouth daily.     pyridOXINE (VITAMIN B-6) 100 MG tablet Take 100 mg by mouth daily.     Respiratory Therapy Supplies (FLUTTER) DEVI Use the flutter valve 10 times when doing it daily. 1 each 0   tamsulosin (FLOMAX) 0.4 MG CAPS capsule Take 0.4 mg by mouth daily.  2   telmisartan (MICARDIS) 20 MG tablet Take 20 mg by mouth daily.     Turmeric 500 MG CAPS Take 500 mg by mouth daily.     vitamin B-12 (CYANOCOBALAMIN) 1000 MCG tablet Take 1,000 mcg by mouth daily.     spironolactone (ALDACTONE) 25 MG tablet 1 tablet     No current facility-administered medications for this encounter.    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   Patient drinks 1 cup of coffee per day.   Social Determinants of Health   Financial Resource Strain: Not on file  Food Insecurity: Not on file  Transportation Needs: Not on file  Physical Activity: Not on file  Stress: Not on file  Social Connections: Not on file  Intimate Partner Violence: Not on file    Family History  Problem Relation Age of Onset   Pneumonia Mother 92   Cancer - Lung Father 99   Multiple myeloma Brother 74    ROS- All systems are reviewed and negative except as per the HPI above  Physical Exam: Vitals:   09/23/21 1506  BP: 128/72  Pulse: 68  Weight: 94.7 kg  Height: 5' 9.5" (1.765 m)   Wt Readings from Last 3 Encounters:  09/23/21 94.7 kg  08/19/21 98.9 kg  08/10/21 99.2 kg    Labs: Lab Results  Component Value Date   NA 138 08/26/2021   K 4.6 08/26/2021   CL 100 08/26/2021   CO2 22 08/26/2021   GLUCOSE 160 (H) 08/26/2021   BUN 26 08/26/2021   CREATININE 0.99 08/26/2021   CALCIUM 9.6 08/26/2021   MG 2.0 06/28/2021   No results found for: INR Lab Results  Component Value Date   CHOL 96 03/06/2020   HDL 48 03/06/2020   LDLCALC 38 03/06/2020   TRIG 50 03/06/2020     GEN- The patient is well appearing, alert and oriented x 3 today.   Head- normocephalic, atraumatic Eyes-  Sclera clear, conjunctiva pink Ears- hearing intact Oropharynx- clear Neck- supple, no JVP Lymph- no cervical lymphadenopathy Lungs- Clear to ausculation  bilaterally, normal work of breathing Heart- Regular rate and rhythm, no murmurs, rubs or gallops, PMI not laterally displaced GI- soft, NT, ND, + BS Extremities- no clubbing, cyanosis, or edema MS- no significant deformity or atrophy Skin- no rash or lesion Psych- euthymic mood, full affect Neuro- strength and sensation are intact  EKG-SR with first degree AV block at 66 bpm, pr int 274 ms, qrs int 88 ms, qtc 465 ms  Epic records reviewed    Assessment and Plan:  1. Episode of acute shortness of breath resolved within 30 mins  I doubt this was afib as he was on a monitor within a few minutes while feeling poorly and was in SR as well as EKG showing SR  No change in treatment for afib  2. CHA2DS2VASc  score of at least 5 Continue  apixaban 5 mg bid   3. Moderate aortic stenosis 08/13/21 (mild on previous  echo)  Anemia may be causing this to be more symptomatic    4, Newly found anemia with 10 lb weight loss over 2 months with associated fatigue and exertional dyspnea/black stools  PCP is trying to arrange for GI appointment asap  Episode may have been related to this new finding   F/u with cardiology and PCP/GI  as scheduled   Butch Penny C. , [facility] [address] [place], [place] [address] [phone]

## 2021-09-23 NOTE — Telephone Encounter (Signed)
   Dr. Louis Matte requesting a call back from Dr. Rayann Heman or Richardson Dopp, she need to discuss about the pt.

## 2021-09-23 NOTE — Telephone Encounter (Signed)
I spoke to Dr. Louis Matte with [facility]. Pt was seen yesterday with some fatigue and dark stools. Hgb was 10.3 (down from 14.9 in 10/22) and he is getting referred to GI ASAP. Pt was contacted today by Dr. Louis Matte to notify him of lab results and pt noted that he awoke last night short of breath and had chest pain.  He felt he was back in atrial fibrillation.  His symptoms suddenly resolved before EMS arrived.  When EMS arrived, it was noted his EKG was ok.  He did not go to the ED.  EKG from EMS has not been reviewed.  Echocardiogram in 9/22 showed normal EF and mod AS Myoview in 10/22 was low risk.  Pt may have had recurrent atrial fibrillation. Dr. Louis Matte wanted to know if a Zio monitor should be placed. We should confirm if he is in atrial fibrillation or normal sinus rhythm before deciding on the monitor.   He is on dofetilide for rhythm control. He has seen the AFib Clinic in the past.  Triage: Please contact patient for update on current symptoms. If he is still having chest pain or worsening shortness of breath, he needs to go to the ED. If pt feels he is back to his baseline, see if we can get him into the AFib clinic this PM or tomorrow to evaluate for recurrent atrial fibrillation. [facility] # (if needed) is [place], PA-C    09/23/2021 12:20 PM

## 2021-09-24 ENCOUNTER — Encounter (HOSPITAL_COMMUNITY): Payer: Self-pay

## 2021-09-27 ENCOUNTER — Telehealth: Payer: Self-pay | Admitting: *Deleted

## 2021-09-27 DIAGNOSIS — I480 Paroxysmal atrial fibrillation: Secondary | ICD-10-CM | POA: Diagnosis not present

## 2021-09-27 DIAGNOSIS — K279 Peptic ulcer, site unspecified, unspecified as acute or chronic, without hemorrhage or perforation: Secondary | ICD-10-CM | POA: Diagnosis not present

## 2021-09-27 DIAGNOSIS — I350 Nonrheumatic aortic (valve) stenosis: Secondary | ICD-10-CM | POA: Diagnosis not present

## 2021-09-27 DIAGNOSIS — K921 Melena: Secondary | ICD-10-CM | POA: Diagnosis not present

## 2021-09-27 DIAGNOSIS — R195 Other fecal abnormalities: Secondary | ICD-10-CM | POA: Diagnosis not present

## 2021-09-27 DIAGNOSIS — D649 Anemia, unspecified: Secondary | ICD-10-CM | POA: Diagnosis not present

## 2021-09-27 NOTE — Telephone Encounter (Signed)
Patient with diagnosis of a fib on Eliquis for anticoagulation.    Procedure: endoscopy Date of procedure: TBD  CHA2DS2-VASc Score = 5  This indicates a 7.2% annual risk of stroke. The patient's score is based upon: CHF History: 1 HTN History: 1 Diabetes History: 0 Stroke History: 0 Vascular Disease History: 1 Age Score: 2 Gender Score: 0   CrCl Scr 0.99 34ml/min (08/19/21) Platelet count 199 (08/19/21)  Per office protocol, patient can hold Eliquis for 2 days prior to procedure.    Cathrine Muster, PharmD PGY2 Cardiology Pharmacy Resident

## 2021-09-27 NOTE — Telephone Encounter (Signed)
   Pre-operative Risk Assessment    Patient Name: Patrick Franco  DOB: September 01, 1932 MRN: [id number]      Request for Surgical Clearance   Procedure:   ENDOSCOPY ; DX ANEMIA, MELENA, OCCULT GI BLEEDING  Date of Surgery: Clearance TBD                                 Surgeon:  Deliah Goody, PAC LISTED ON CLEARANCE  Surgeon's Group or Practice Name:  [facility] Phone number:  [phone] Fax number:  [phone]   Type of Clearance Requested: - Medical  - Pharmacy:  Hold Apixaban (Eliquis) x 2-3 DAYS PRIOR TO PROCEDURE   Type of Anesthesia:   PROPOFOL   Additional requests/questions:   [name]   09/27/2021, 12:06 PM

## 2021-09-27 NOTE — Telephone Encounter (Signed)
Covering preop today. Will route to pharm. Given indication for procedure unless any outside hospital cardioversion procedures in the interim since last OV, would likely clear for further evaluation.

## 2021-09-28 DIAGNOSIS — K921 Melena: Secondary | ICD-10-CM | POA: Diagnosis not present

## 2021-09-28 DIAGNOSIS — R0600 Dyspnea, unspecified: Secondary | ICD-10-CM | POA: Diagnosis not present

## 2021-09-28 DIAGNOSIS — M109 Gout, unspecified: Secondary | ICD-10-CM | POA: Diagnosis not present

## 2021-09-28 DIAGNOSIS — I10 Essential (primary) hypertension: Secondary | ICD-10-CM | POA: Diagnosis not present

## 2021-09-29 NOTE — Telephone Encounter (Signed)
   Primary Cardiologist: [name], MD  Chart reviewed as part of pre-operative protocol coverage. Given past medical history and time since last visit, based on ACC/AHA guidelines, Patrick Franco would be at acceptable risk for the planned procedure without further cardiovascular testing.   Patient with diagnosis of a fib on Eliquis for anticoagulation.     Procedure: endoscopy Date of procedure: TBD   CHA2DS2-VASc Score = 5  This indicates a 7.2% annual risk of stroke. The patient's score is based upon: CHF History: 1 HTN History: 1 Diabetes History: 0 Stroke History: 0 Vascular Disease History: 1 Age Score: 2 Gender Score: 0   CrCl Scr 0.99 39ml/min (08/19/21) Platelet count 199 (08/19/21)   Per office protocol, patient can hold Eliquis for 2 days prior to procedure.    I will route this recommendation to the requesting party via Epic fax function and remove from pre-op pool.  Please call with questions.  Jossie Ng.  NP-C    09/29/2021, 8:29 AM [address] Office [phone] Fax [phone]

## 2021-09-30 ENCOUNTER — Telehealth: Payer: Self-pay | Admitting: Physician Assistant

## 2021-09-30 NOTE — Telephone Encounter (Signed)
Recent BPs received via mail and reviewed: 146/94 140/88 137/81 138/85 140/85 161/104 161/98 157/98 136/85 161/99 135/79 147/90 151/88  BP could be better.  But, note he has pending workup for anemia.  Recent visit to AFib Clinic with optimal BP.    I would hold off on adjusting meds for now. He still has Telmisartan on his list.  He notified us he was no longer taking this. Please take off his list.  Ask him to continue to checking his BP.  If he sees more than 3 readings > 140/90, call us.  At that point, I would start him back on the Telmisartan.  Patrick Dopp, PA-C    09/30/2021 1:34 PM

## 2021-10-01 ENCOUNTER — Other Ambulatory Visit: Payer: Self-pay | Admitting: Gastroenterology

## 2021-10-01 ENCOUNTER — Other Ambulatory Visit: Payer: Self-pay | Admitting: *Deleted

## 2021-10-01 NOTE — Telephone Encounter (Signed)
S/w pt is aware of [name]'s recommendations. Pt stated bp readings go directly to PCP everyday but will keep a record of them.  Will send to [facility] To FYI.

## 2021-10-04 NOTE — Anesthesia Preprocedure Evaluation (Addendum)
Anesthesia Evaluation  Patient identified by MRN, date of birth, ID band Patient awake    Reviewed: Allergy & Precautions, NPO status , Patient's Chart, lab work & pertinent test results  Airway Mallampati: II  TM Distance: >3 FB Neck ROM: Full    Dental no notable dental hx. (+) Teeth Intact, Dental Advisory Given   Pulmonary shortness of breath, sleep apnea , former smoker,    Pulmonary exam normal breath sounds clear to auscultation       Cardiovascular hypertension, + CAD  Normal cardiovascular exam Rhythm:Regular Rate:Normal  09/05/23 Myoview  .  The study is normal. Findings are consistent with no prior ischemia. The study is low risk. .  No ST deviation was noted. .  LV perfusion is normal. .  Left ventricular function is normal. Nuclear stress EF: 67 %. The left ventricular ejection fraction is normal (55-65%). End diastolic cavity size is mildly enlarged. End systolic cavity size is normal. .  Prior study available for comparison from 04/07/2017.  Normal study. No evidence of ischemia or infarction. EF Normal.  Compared to prior study on 04/07/2017 no significant change.     Neuro/Psych  Neuromuscular disease    GI/Hepatic Neg liver ROS, PUD,   Endo/Other  negative endocrine ROS  Renal/GU      Musculoskeletal  (+) Arthritis ,   Abdominal   Peds  Hematology Lab Results      Component                Value               Date                      WBC                      5.8                 08/19/2021                HGB                      14.9                08/19/2021                HCT                      43.9                08/19/2021                MCV                      99 (H)              08/19/2021                PLT                      199                 08/19/2021              Anesthesia Other Findings   Reproductive/Obstetrics                            Anesthesia  Physical Anesthesia Plan  ASA: 3  Anesthesia Plan: MAC   Post-op Pain Management: Minimal or no pain anticipated   Induction:   PONV Risk Score and Plan: 2 and Treatment may vary due to age or medical condition  Airway Management Planned: Natural Airway and Simple Face Mask  Additional Equipment: None  Intra-op Plan:   Post-operative Plan:   Informed Consent: I have reviewed the patients History and Physical, chart, labs and discussed the procedure including the risks, benefits and alternatives for the proposed anesthesia with the patient or authorized representative who has indicated his/her understanding and acceptance.     Dental advisory given  Plan Discussed with: CRNA  Anesthesia Plan Comments: (EGD for melena and GI bleedin)       Anesthesia Quick Evaluation

## 2021-10-04 NOTE — H&P (Signed)
History of Present Illness  General:          87 year old male, previous patient of Dr. Oletta Lamas, is referred to evaluate black stools last week. Most recent CBC done 09/22/21 showed hemoglobin 10.3, hematocrit 29, MCV 98, platelets 203. CMP showed elevated BUN 46, glucose 140, creatinine 0.98, normal LFTs and electrolytes. Normal ferritin 95. Normal vitamin B12 of 752. Normal folate.        Patient states he had several black stools one week ago. He admits to increased shortness of breath with exertion and increased fatigue for several weeks. Stool was brown this morning. He denies bright red rectal bleeding. Denies abdominal pain, nausea, vomiting, diarrhea, constipation, or weight loss. He is here today with his wife.        He has history of bleeding gastric ulcer found in 2016 which was a 1-2 centimeter mass in the gastric antrum. Multiple EGDs and biopsies were benign and showed reactive gastropathy. No malignancy. History of peptic ulcer disease and H. pylori treated in 1997. History of diverticular bleed in 2014.        Last EGD done 03/2017 by Dr. Oletta Lamas showed localized white plaques in the proximal esophagus, localized moderate inflammation in the gastric antrum, small polypoid mass with no bleeding in the gastric antrum, much smaller than previous EGDs. Normal duodenum. Biopsies in the esophagus were consistent with candidiasis and he was treated with Diflucan. Stomach biopsies showed chronic inflammation, negative H. pylori, no malignancy.        He is on Apixiban (Eliquis), has history of paroxysmal Afib, CAD, CHF, moderate aortic stenosis, mitral valve prolapse. Cardiologist Dr. Merlene Pulling. Electrophysiologist Dr. Rayann Heman. He saw cardiology PA last week.        He was started on pantoprazole 40 milligrams twice daily for the past week by his PCP. Continues on Eliquis. He denies aspirin or NSAID use. He is not on iron.        Last colonoscopy done 05/2014 showed one small benign tubular  adenomatous polyp and one small hyperplastic removed by Dr. Wynetta Emery. No further repeat colonoscopies were recommended due to advanced age.     Current Medications  Taking  Allopurinol 100 mg Tablet 2 tablets Once a day  Apixaban 5 MG Tablet as directed Orally 1 tablet, 2 times a day, Notes: Eliquis  Atorvastatin Calcium 10 mg Tablet TAKE 1 TABLET ONCE DAILY.  dilTIAZem HCl ER Coated Beads 240 MG Capsule Extended Release 24 Hour Oral  Dofetilide 500 MCG Capsule 1 capsule Orally Twice a day Dutasteride 0.5 MG Capsule 1 capsule Orally Once a day Furosemide 40 MG Tablet 1 tablet Orally Once a day Metoprolol Tartrate 25 MG Tablet 1 tablet with food Orally once a day, Spironolactone 25 MG Tablet 1 tablet Orally Once a day Tamsulosin HCl 0.4 MG Capsule 1 capsule 30 minutes after the same meal each day Orally Once a day Pantoprazole Sodium 40 MG Tablet Delayed Release 1 tablet Orally twice daily Mirtazapine 30 MG Tablet 1 tablet at bedtime Orally Once a day Potassium Chloride Crys ER 20 mEq Tablet Extended Release TAKE 1 TABLET BY MOUTH TWICE DAILY.  Omega 3 1200 MG Capsule 1 capsule Orally Once a day Vitamin B-6 100 MG Tablet 1 tablet Orally Once a day Vitamin B12 1000 MCG Tablet Extended Release 1 tablet Orally Once a day MVI 1 tablet Once a day  Probiotic - Capsule 1 capsule Orally once a day  Turmeric 500 MG Capsule 1 capsule Orally once a day,  Notes: current bottle is 1000 mg Systane(Polyethyl Glycol-Propyl Glycol) 0.4-0.3 % Solution 1 drop into both eyes Ophthalmic three times a day, Notes: Listed in Epic   Past Medical History       Essential hypertension, benign.       peptic ulcer disease (1997; H. pylori treated).      Diverticulosis.      coronary artery disease on CT scan 2019.      CHF with atypical atrial flutter, echo severe LVH, April 2021.      diverticular bleeding - 2014 - while in [place].      impaired fasting glucose (123 in 2010; A1c was 5.2).      BPH and elevated PSA,  multiple biopsies, Dr. Junious Silk.      gait disorder, bilateral foot drops, MRI unremarkable, lab work unremarkable, [facility].      E coli sepsis after prostate bx - 2005.      left knee ruptured ACL - discovered 2012 (probably happened in high school) - uses brace - Dr. Forbes Cellar.      left leg weakness and numbness - prior history of lumbar radiculopathy - Dr. Forbes Cellar.      Ruptured anterior tibial tendon - right leg - very mild foot drop - uses brace prn.      obstructive sleep apnea (PSG 10/23/14 ESS 8, AHI 60/hr REM 62/hr, RDI 62/hr REM 63/hr, O2 79%), CPAP.       Mitral valve prolapse.      Bicspid aortic valve with moderate stenosis, diastolic dysfunction echo 11/17.      Pain in joint, pelvic region and thigh.      Depressive disorder, not elsewhere classified.      Lumbar spinal stenosis.       Hearing loss, hearing aids.      Gout.      left thyroid nodule, benign follicular, [facility] 2019.      ophth - Dr. Marica Otter, [facility], ortho [facility], sleep [facility], urologist [facility], derm jones, pulm-ramaswamy, osteo-smith, cards mcalhaney/allred.   Surgical History        knee replacement,, right (with subsequent revision), [place] [address]        prostate biopsy x3         rotator cuff tear repair (arthroscopic)         cataracts        squamous cell r hand 2017        EUS 09/20/2016        R THA AA- Dr. Ninfa Linden 07/2019      Family History  Father: deceased 71 yrs, (smoker), diagnosed with Lung Cancer  Mother: deceased 12 yrs, dementia  Brother 1: deceased 38 yrs, myeloma  1 brother(s) . 2 daughter(s) .   neg gi family hx for colon ca/polyps and liver Dz.      Social History  General:   Tobacco use      cigarettes: Former smoker for 6 months in army    Quit in year 1960s    Pack-year Hx: .5    Tobacco history last updated 09/27/2021    Vaping No no EXPOSURE TO PASSIVE SMOKE.  Alcohol: yes, 2 drinks wine daily.  Caffeine: yes.  no Recreational drug use.  Exercise: pilates  yoga, trainer strength, 30 min TM or bike.  Marital Status: Married.  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      Allergies N.K.D.A.       Hospitalization/Major Diagnostic Procedure  bleeding  ulcer 08/2015  None in the past year 09/2021    Review of Systems  GI PROCEDURE:         Pacemaker/ AICD no. Artificial heart valves YES, mitral valve murmmue. MI/heart attack no. Abnormal heart rhythm YES Atrial Fibrillation. Angina no. CVA no. Hypertension YES. Hypotension no. Asthma, COPD no. Sleep apnea YES using CPAP. Seizure disorders no. Artificial joints YES RT knee RT hip. Severe DJD no. Diabetes no. Significant headaches no. Vertigo no. Depression/anxiety no. Abnormal bleeding no. Kidney Disease no. Liver disease no. Chance of pregnancy no. Blood transfusion no.        Vital Signs  Wt 206.0, Wt change -2 lb, Ht 68.5, BMI 30.86, Temp 97.9, Pulse sitting 68, BP sitting 120/67.    Examination  Gastroenterology Exam:       GENERAL APPEARANCE: Well developed, obese, feeble elderly male; no active distress, pleasant, no acute distress; No assistive devices for walking. Got on and off exam table with no assistance..        SCLERA: anicteric.        RESPIRATORY Breath sounds clear to auscultation. No wheezes, rales or rhonchi. Respiration even and unlabored.        CARDIOVASCULAR Normal RRR; LOUD HOLOSYSTOLIC MURMUR; Not currently in afib..        ABDOMEN No masses palpated. Liver and spleen not palpated, normal. Bowel sounds normal, Abdomen obese, soft, nontender; Rectus diastasis and reducible umbilical hernia are present. No tenderness.[name]        RECTAL: Stool brown, heme-POSITIVE. No rectal lesions.        PSYCHIATRIC Alert and oriented x3, mood and affect appear normal..      Assessments  1. Anemia - D64.9 (Primary)  2. Occult GI bleeding - R19.5, Heme Positive today  3. Melena - K92.1  4. Peptic ulcer disease - K27.9  5. Paroxysmal atrial fibrillation -  I48.0, In normal sinus rhythm on Tikosyn and Eliquis  6. Aortic stenosis, moderate - I35.0    Symptoms are most suspicious for upper GI bleed/gastric ulcer.     Treatment  1. Anemia        LAB: CBC without Diff      LAB: Ferritin      LAB: Iron Panel  2. Occult GI bleeding        IMAGING: EGD Esophagogastroduodenoscopy Notes: If cardiology gives clearance for EGD and permission to hold Eliquis, then we will schedule EGD in hospital given his age and comorbidities. He is at increased risk for endoscopy procedures. All this was discussed with patient and his wife. He expressed understanding.      3. Melena        IMAGING: EGD Esophagogastroduodenoscopy Notes: I discussed risks of EGD with patient today, including risk of sedation, bleeding or perforation. Patient expressed understanding and agrees to proceed with procedure. Plan to hold Eliquis 2 days before EGD (if cardiology approves).      4. Peptic ulcer disease   Continue Pantoprazole Sodium Tablet Delayed Release, 40 MG, 1 tablet, Orally, twice daily      IMAGING: EGD Esophagogastroduodenoscopy Notes: Remain off all NSAIDS.      5. Paroxysmal atrial fibrillation   Notes: We are requesting cardiac clearance from cardiology for EGD procedure in hospital.,  We are requesting cardiac permission to hold blood thinner (Eliquis) 2-3 days prior to EGD procedure.        Labs            FERRITIN 49.1  23.9-336.2 - ng/mL  IRON BINDING CAPACITY, TOTAL 423  250-450 - ug/dL       IRON 66  50-212 - ug/dL       IRON SATURATION 16 L 20-55 - %       TRANSFERRIN 302  203-362 - mg/dL        WBC 7.3  4.0-11.0 - K/ul       RBC 2.99 L 4.20-5.80 - M/uL       HGB 10.4 L 13.0-17.0 - g/dL       HCT 30.2 L 39.0-52.0 - %       MCV 101.1 H 80.0-94.0 - fL       MCH 34.9 H 27.0-33.0 - pg       MCHC 34.5  32.0-36.0 - g/dL       RDW 15.4  11.5-15.5 - %       PLT 252  150-400 - K/uL

## 2021-10-05 ENCOUNTER — Encounter (HOSPITAL_COMMUNITY)
Admission: RE | Disposition: A | Discharge status: Home / Self Care | Payer: Self-pay | Source: Home / Self Care | Attending: Gastroenterology

## 2021-10-05 ENCOUNTER — Other Ambulatory Visit: Payer: Self-pay

## 2021-10-05 ENCOUNTER — Ambulatory Visit (HOSPITAL_COMMUNITY): Payer: Medicare Other | Admitting: Anesthesiology

## 2021-10-05 ENCOUNTER — Ambulatory Visit (HOSPITAL_COMMUNITY)
Admission: RE | Admit: 2021-10-05 | Discharge: 2021-10-05 | Disposition: A | Discharge status: Home / Self Care | Payer: Medicare Other | Attending: Gastroenterology | Admitting: Gastroenterology

## 2021-10-05 ENCOUNTER — Encounter (HOSPITAL_COMMUNITY): Payer: Self-pay | Admitting: Gastroenterology

## 2021-10-05 DIAGNOSIS — I2510 Atherosclerotic heart disease of native coronary artery without angina pectoris: Secondary | ICD-10-CM | POA: Diagnosis not present

## 2021-10-05 DIAGNOSIS — I10 Essential (primary) hypertension: Secondary | ICD-10-CM | POA: Insufficient documentation

## 2021-10-05 DIAGNOSIS — K449 Diaphragmatic hernia without obstruction or gangrene: Secondary | ICD-10-CM | POA: Insufficient documentation

## 2021-10-05 DIAGNOSIS — K319 Disease of stomach and duodenum, unspecified: Secondary | ICD-10-CM | POA: Insufficient documentation

## 2021-10-05 DIAGNOSIS — D62 Acute posthemorrhagic anemia: Secondary | ICD-10-CM | POA: Diagnosis not present

## 2021-10-05 DIAGNOSIS — K314 Gastric diverticulum: Secondary | ICD-10-CM | POA: Insufficient documentation

## 2021-10-05 DIAGNOSIS — K222 Esophageal obstruction: Secondary | ICD-10-CM | POA: Diagnosis not present

## 2021-10-05 DIAGNOSIS — K274 Chronic or unspecified peptic ulcer, site unspecified, with hemorrhage: Secondary | ICD-10-CM | POA: Diagnosis not present

## 2021-10-05 DIAGNOSIS — K254 Chronic or unspecified gastric ulcer with hemorrhage: Secondary | ICD-10-CM | POA: Insufficient documentation

## 2021-10-05 DIAGNOSIS — I484 Atypical atrial flutter: Secondary | ICD-10-CM | POA: Diagnosis not present

## 2021-10-05 DIAGNOSIS — I080 Rheumatic disorders of both mitral and aortic valves: Secondary | ICD-10-CM | POA: Diagnosis not present

## 2021-10-05 DIAGNOSIS — K259 Gastric ulcer, unspecified as acute or chronic, without hemorrhage or perforation: Secondary | ICD-10-CM | POA: Diagnosis not present

## 2021-10-05 DIAGNOSIS — K922 Gastrointestinal hemorrhage, unspecified: Secondary | ICD-10-CM | POA: Diagnosis not present

## 2021-10-05 DIAGNOSIS — K921 Melena: Secondary | ICD-10-CM | POA: Insufficient documentation

## 2021-10-05 DIAGNOSIS — Z87891 Personal history of nicotine dependence: Secondary | ICD-10-CM | POA: Diagnosis not present

## 2021-10-05 DIAGNOSIS — K2970 Gastritis, unspecified, without bleeding: Secondary | ICD-10-CM | POA: Diagnosis not present

## 2021-10-05 HISTORY — PX: ESOPHAGOGASTRODUODENOSCOPY: SHX5428

## 2021-10-05 HISTORY — PX: BIOPSY: SHX5522

## 2021-10-05 SURGERY — EGD (ESOPHAGOGASTRODUODENOSCOPY)
Anesthesia: Monitor Anesthesia Care

## 2021-10-05 MED ORDER — LACTATED RINGERS IV SOLN: INTRAVENOUS | Status: DC

## 2021-10-05 MED ORDER — PANTOPRAZOLE SODIUM 40 MG PO TBEC: 40.0000 mg | DELAYED_RELEASE_TABLET | Freq: Two times a day (BID) | ORAL | 1 refills | Status: DC

## 2021-10-05 MED ORDER — SODIUM CHLORIDE 0.9 % IV SOLN: INTRAVENOUS | Status: DC

## 2021-10-05 MED ORDER — EPHEDRINE SULFATE-NACL 50-0.9 MG/10ML-% IV SOSY
PREFILLED_SYRINGE | INTRAVENOUS | Status: DC | PRN
  Administered 2021-10-05 (×2): 10 mg via INTRAVENOUS
  Administered 2021-10-05: 5 mg via INTRAVENOUS

## 2021-10-05 MED ORDER — PROPOFOL 500 MG/50ML IV EMUL
INTRAVENOUS | Status: DC | PRN
  Administered 2021-10-05: 150 ug/kg/min via INTRAVENOUS

## 2021-10-05 NOTE — Op Note (Signed)
[facility] Patient Name: Patrick Franco Procedure Date: 10/05/2021 MRN: [id number] Attending MD: Ronnette Juniper , MD Date of Birth: 05-30-1932 CSN: [id number] Age: 87 Admit Type: Inpatient Procedure:                Upper GI endoscopy Indications:              Acute post hemorrhagic anemia, Melena Providers:                Ronnette Juniper, MD, Dulcy Fanny, Benetta Spar,                            Technician Referring MD:             Celene Kras Medicines:                Monitored Anesthesia Care Complications:            No immediate complications. Estimated blood loss:                            Minimal. Estimated Blood Loss:     Estimated blood loss was minimal. Procedure:                Pre-Anesthesia Assessment:                           - Prior to the procedure, a History and Physical                            was performed, and patient medications and                            allergies were reviewed. The patient's tolerance of                            previous anesthesia was also reviewed. The risks                            and benefits of the procedure and the sedation                            options and risks were discussed with the patient.                            All questions were answered, and informed consent                            was obtained. Prior Anticoagulants: The patient has                            taken Eliquis (apixaban), last dose was 2 days                            prior to procedure. ASA Grade Assessment: III - A  patient with severe systemic disease. After                            reviewing the risks and benefits, the patient was                            deemed in satisfactory condition to undergo the                            procedure.                           After obtaining informed consent, the endoscope was                            passed under direct vision. Throughout the                             procedure, the patient's blood pressure, pulse, and                            oxygen saturations were monitored continuously. The                            [id number]-[id number] ([id number]) Olympus endoscope was introduced                            through the mouth, and advanced to the second part                            of duodenum. The upper GI endoscopy was                            accomplished without difficulty. The patient                            tolerated the procedure well. Scope In: Scope Out: Findings:      The upper third of the esophagus, middle third of the esophagus and       lower third of the esophagus were normal.      A widely patent Schatzki ring was found at the gastroesophageal junction.      A 1 cm hiatal hernia was present.      One non-bleeding cratered gastric ulcer with a clean ulcer base (Forrest       Class III) was found in the gastric antrum. The lesion was 5 mm in       largest dimension. Biopsies were taken with a cold forceps for       Helicobacter pylori testing.      Two small non-bleeding diverticulum was found in the gastric fundus. One       of them appeared inverted with abnormal appearing mucosa. Biopsies were       taken with a cold forceps for histology.      The examined duodenum was normal. Impression:               - Normal upper  third of esophagus, middle third of                            esophagus and lower third of esophagus.                           - Widely patent Schatzki ring.                           - 1 cm hiatal hernia.                           - Non-bleeding gastric ulcer with a clean ulcer                            base (Forrest Class III). Biopsied.                           - Gastric diverticulum, mucosal changes. Biopsied.                           - Normal examined duodenum. Moderate Sedation:      Patient did not receive moderate sedation for this procedure, but       instead received monitored  anesthesia care. Recommendation:           - Discharge patient to home.                           - Resume regular diet.                           - Continue present medications, PPI BID for 2                            months.                           - Await pathology results.                           - Resume Eliquis (apixaban) at prior dose tomorrow. Procedure Code(s):        --- Professional ---                           [id number], Esophagogastroduodenoscopy, flexible,                            transoral; with biopsy, single or multiple Diagnosis Code(s):        --- Professional ---                           K22.2, Esophageal obstruction                           K44.9, Diaphragmatic hernia without obstruction or  gangrene                           K25.9, Gastric ulcer, unspecified as acute or                            chronic, without hemorrhage or perforation                           K31.4, Gastric diverticulum                           D62, Acute posthemorrhagic anemia                           K92.1, Melena (includes Hematochezia) CPT copyright 2019 American Medical Association. All rights reserved. The codes documented in this report are preliminary and upon coder review may  be revised to meet current compliance requirements. Ronnette Juniper, MD 10/05/2021 10:08:28 AM This report has been signed electronically. Number of Addenda: 0

## 2021-10-05 NOTE — Discharge Instructions (Signed)
YOU HAD AN ENDOSCOPIC PROCEDURE TODAY: Refer to the procedure report and other information in the discharge instructions given to you for any specific questions about what was found during the examination. If this information does not answer your questions, please call [facility] office at [phone] to clarify.   YOU SHOULD EXPECT: Some feelings of bloating in the abdomen. Passage of more gas than usual. Walking can help get rid of the air that was put into your GI tract during the procedure and reduce the bloating.  DIET: Your first meal following the procedure should be a light meal and then it is ok to progress to your normal diet. A half-sandwich or bowl of soup is an example of a good first meal. Heavy or fried foods are harder to digest and may make you feel nauseous or bloated. Drink plenty of fluids but you should avoid alcoholic beverages for 24 hours.   ACTIVITY: Your care partner should take you home directly after the procedure. You should plan to take it easy, moving slowly for the rest of the day. You can resume normal activity the day after the procedure however YOU SHOULD NOT DRIVE, use power tools, machinery or perform tasks that involve climbing or major physical exertion for 24 hours (because of the sedation medicines used during the test).   SYMPTOMS TO REPORT IMMEDIATELY: A gastroenterologist can be reached at any hour. Please call [phone]  for any of the following symptoms:  Following upper endoscopy (EGD, EUS, ERCP, esophageal dilation) Vomiting of blood or coffee ground material  New, significant abdominal pain  New, significant chest pain or pain under the shoulder blades  Painful or persistently difficult swallowing  New shortness of breath  Black, tarry-looking or red, bloody stools  FOLLOW UP:  If any biopsies were taken you will be contacted by phone or by letter within the next 1-3 weeks. Call [phone]  if you have not heard about the biopsies in 3 weeks.   Please also call with any specific questions about appointments or follow up tests. YOU HAD AN ENDOSCOPIC PROCEDURE TODAY: Refer to the procedure report and other information in the discharge instructions given to you for any specific questions about what was found during the examination. If this information does not answer your questions, please call [facility] office at [phone] to clarify.  

## 2021-10-05 NOTE — Anesthesia Postprocedure Evaluation (Signed)
Anesthesia Post Note  Patient: Patrick Franco  Procedure(s) Performed: ESOPHAGOGASTRODUODENOSCOPY (EGD) BIOPSY     Patient location during evaluation: Endoscopy Anesthesia Type: MAC Level of consciousness: awake and alert Pain management: pain level controlled Vital Signs Assessment: post-procedure vital signs reviewed and stable Respiratory status: spontaneous breathing, nonlabored ventilation, respiratory function stable and patient connected to nasal cannula oxygen Cardiovascular status: blood pressure returned to baseline and stable Postop Assessment: no apparent nausea or vomiting Anesthetic complications: no   No notable events documented.  Last Vitals:  Vitals:   10/05/21 1030 10/05/21 1040  BP: 111/74 124/71  Pulse: (!) 54 (!) 55  Resp: 16 17  Temp:    SpO2: 93% 92%    Last Pain:  Vitals:   10/05/21 1040  TempSrc:   PainSc: 0-No pain                 Barnet Glasgow

## 2021-10-05 NOTE — Anesthesia Procedure Notes (Signed)
Procedure Name: MAC Date/Time: 10/05/2021 9:58 AM Performed by: Lieutenant Diego, CRNA Pre-anesthesia Checklist: Patient identified, Emergency Drugs available, Suction available, Patient being monitored and Timeout performed Patient Re-evaluated:Patient Re-evaluated prior to induction Oxygen Delivery Method: Simple face mask Induction Type: IV induction

## 2021-10-05 NOTE — Interval H&P Note (Signed)
History and Physical Interval Note: 88/male, history of black stools for 1 week, was on Eliquis, last dose 2 days ago, anemia and FOBT positive stools, history of PUD, for an EGD with propofol.  10/05/2021 9:09 AM  Patrick Franco  has presented today for EGD with the diagnosis of Melena, GI bleeding.  The various methods of treatment have been discussed with the patient and family. After consideration of risks, benefits and other options for treatment, the patient has consented to  Procedure(s): ESOPHAGOGASTRODUODENOSCOPY (EGD) (N/A) as a surgical intervention.  The patient's history has been reviewed, patient examined, no change in status, stable for surgery.  I have reviewed the patient's chart and labs.  Questions were answered to the patient's satisfaction.     Ronnette Juniper

## 2021-10-05 NOTE — Transfer of Care (Signed)
Immediate Anesthesia Transfer of Care Note  Patient: Patrick Franco  Procedure(s) Performed: ESOPHAGOGASTRODUODENOSCOPY (EGD) BIOPSY  Patient Location: Endoscopy Unit  Anesthesia Type:MAC  Level of Consciousness: awake  Airway & Oxygen Therapy: Patient Spontanous Breathing and Patient connected to face mask oxygen  Post-op Assessment: Report given to RN and Post -op Vital signs reviewed and stable  Post vital signs: Reviewed and stable  Last Vitals:  Vitals Value Taken Time  BP    Temp    Pulse    Resp    SpO2      Last Pain:  Vitals:   10/05/21 0915  TempSrc: Oral  PainSc: 0-No pain         Complications: No notable events documented.

## 2021-10-06 DIAGNOSIS — L853 Xerosis cutis: Secondary | ICD-10-CM | POA: Diagnosis not present

## 2021-10-06 DIAGNOSIS — L821 Other seborrheic keratosis: Secondary | ICD-10-CM | POA: Diagnosis not present

## 2021-10-06 DIAGNOSIS — D692 Other nonthrombocytopenic purpura: Secondary | ICD-10-CM | POA: Diagnosis not present

## 2021-10-06 DIAGNOSIS — D1801 Hemangioma of skin and subcutaneous tissue: Secondary | ICD-10-CM | POA: Diagnosis not present

## 2021-10-06 DIAGNOSIS — D225 Melanocytic nevi of trunk: Secondary | ICD-10-CM | POA: Diagnosis not present

## 2021-10-06 DIAGNOSIS — L570 Actinic keratosis: Secondary | ICD-10-CM | POA: Diagnosis not present

## 2021-10-06 DIAGNOSIS — Z85828 Personal history of other malignant neoplasm of skin: Secondary | ICD-10-CM | POA: Diagnosis not present

## 2021-10-06 LAB — SURGICAL PATHOLOGY

## 2021-10-08 ENCOUNTER — Encounter (HOSPITAL_COMMUNITY): Payer: Self-pay | Admitting: Gastroenterology

## 2021-10-13 DIAGNOSIS — I10 Essential (primary) hypertension: Secondary | ICD-10-CM | POA: Diagnosis not present

## 2021-10-14 ENCOUNTER — Other Ambulatory Visit: Payer: Self-pay

## 2021-10-14 MED ORDER — SPIRONOLACTONE 25 MG PO TABS: 25.0000 mg | ORAL_TABLET | Freq: Every day | ORAL | 2 refills | Status: DC

## 2021-10-14 NOTE — Telephone Encounter (Signed)
Pt's medication was sent to pt's pharmacy as requested. Confirmation received.  °

## 2021-10-25 DIAGNOSIS — D509 Iron deficiency anemia, unspecified: Secondary | ICD-10-CM | POA: Diagnosis not present

## 2021-10-25 DIAGNOSIS — K259 Gastric ulcer, unspecified as acute or chronic, without hemorrhage or perforation: Secondary | ICD-10-CM | POA: Diagnosis not present

## 2021-11-12 DIAGNOSIS — I10 Essential (primary) hypertension: Secondary | ICD-10-CM | POA: Diagnosis not present

## 2021-12-02 DIAGNOSIS — E785 Hyperlipidemia, unspecified: Secondary | ICD-10-CM | POA: Insufficient documentation

## 2021-12-02 NOTE — Progress Notes (Signed)
Cardiology Office Note:    Date:  12/03/2021   ID:  Patrick Franco, DOB 10/04/1932, MRN [id number]  PCP:  Lavone Orn, MD  [facility] Providers Cardiologist:  [name], MD Electrophysiologist:  Thompson Grayer, MD     Referring MD: Lavone Orn, MD   Chief Complaint:  F/u for CAD, CHF, AFib, AS    Patient Profile: Coronary artery disease CT 12/2017: LM and 3 v coronary atherosclerosis  Myoview 03/2017: no ischemia  Myoview 10/22: low risk Aortic stenosis, moderate   Echocardiogram 02/2020: EF 60-65, mild AS (mean 17 mmHg), mild MVP w mild MR Echocardiogram 9/22: mean 26.5, Vmax 373 cm/s, DI 0.27 (HFpEF) heart failure with preserved ejection fraction  Atrial Fibrillation/Flutter New onset 02/2020 >> c/b acute diastolic CHF; admitted to [facility] S/p TEE-DCCV 03/2020 Dofetilide started 6.2021 Hypertension  MVP Mild MR Echocardiogram 02/2020 Sleep apnea  PACs Aortic atherosclerosis (CT 12/2017)   Tracheal malacia    Prior CV studies: Myoview 09/08/21 Normal study. No evidence of ischemia or infarction. EF Normal. Compared to prior study on 04/07/2017 no significant change.  Echocardiogram 07/16/21 EF 60-65, no RWMA, mild LVH, GR 1 DD, normal RVSF, severe LAE, mild MR, severe MAC, moderate aortic stenosis (mean 26.5 mmHg, V-max 373 cm/s, DI 0.27)   TEE 03/27/20 EF 60-65, normal RVSF, mild RAE, severe LAE, mod MR, mild AI, mild dilation of asc Ao   Echocardiogram 03/05/2020 EF 60-65, no RWMA, severe LVH, normal RVSF, severe LAE, mod RAE, mild MVP with mild MR, mild AI, mild AS (mean 17 mmHg).     Myoview 04/07/17 EF 61, no ischemia or infarction.  Low Risk    History of Present Illness:   Patrick Franco is a 88 y.o. male with the above problem list.  He was last seen in 9/22.  I put him on Spironolactone for uncontrolled BP.  He had symptoms of shortness of breath and a Myoview was obtained.  This was neg for ischemia.  He was then seen in the AFib clinic for an episode of  shortness of breath.  He remained in normal sinus rhythm.   He was noted to be anemic recently with his PCP and was being referred to GI.  EGD in 11/22 demonstrated a non-bleeding gastric ulcer.  He returns for f/u.  He is here alone.  He just got back from duck hunting in [place].  He is overall doing well.  He had some lightheaded symptoms several weeks ago.  He changed his metoprolol and spironolactone to PM dosing.  He has felt fine since.  He continues to have shortness of breath with exertion.  This is unchanged.  He has had no orthopnea, leg edema or syncope.  He notes his Hgb is improved on recent labs.       Past Medical History:  Diagnosis Date   Aortic stenosis    Arthritis    Atrial fibrillation (HCC)    BPH (benign prostatic hyperplasia)    Chronic low back pain    Depression    Diverticulitis    Elevated PSA    Foot drop, right    Gait disorder 02/03/2015   Hip pain    History of colonoscopy    History of nuclear stress test    Myoview 10/22: EF 67, no ischemia or infarction; low risk   HOH (hard of hearing)    hearing aid   Hypertension    Lumbar radiculopathy    Mitral valve prolapse    Onychomycosis  Peptic ulcer disease    Sepsis ([place]) 2005   e. coli-after prostate bx   Sleep apnea    Torn ACL    Tracheobronchomalacia    seen on 2019 Chest CT   Current Medications: Current Meds  Medication Sig   allopurinol (ZYLOPRIM) 100 MG tablet TAKE 1 TABLET ONCE DAILY. (Patient taking differently: Take 200 mg by mouth daily.)   apixaban (ELIQUIS) 5 MG TABS tablet Take 1 tablet (5 mg total) by mouth 2 (two) times daily.   atorvastatin (LIPITOR) 10 MG tablet Take 10 mg by mouth daily.    diltiazem (CARDIZEM CD) 240 MG 24 hr capsule TAKE (1) CAPSULE DAILY.   dofetilide (TIKOSYN) 500 MCG capsule Take 1 capsule (500 mcg total) by mouth 2 (two) times daily.   dutasteride (AVODART) 0.5 MG capsule Take 0.5 mg by mouth daily.   furosemide (LASIX) 40 MG tablet Take 1.5  tablets (60 mg total) by mouth daily. TAKE 1 TABLET TWICE DAILY FOR 3 DAYS THEN TAKE 1&1/2 TABLETS DAILY THEREAFTER. (Patient taking differently: Take 60 mg by mouth daily.)   metoprolol tartrate (LOPRESSOR) 25 MG tablet Take 1 tablet (25 mg total) by mouth daily. (Patient taking differently: Take 25 mg by mouth every evening.)   mirtazapine (REMERON) 30 MG tablet TAKE ONE TABLET AT BEDTIME.   Multiple Vitamin (MULTIVITAMIN) tablet Take 1 tablet by mouth daily.   Omega-3 Fatty Acids (FISH OIL) 1200 MG CAPS Take 1,200 mg by mouth daily.   pantoprazole (PROTONIX) 40 MG tablet Take 1 tablet (40 mg total) by mouth 2 (two) times daily.   Polyethyl Glycol-Propyl Glycol (SYSTANE) 0.4-0.3 % SOLN Place 1 drop into both eyes 2 (two) times daily.   potassium chloride SA (KLOR-CON) 20 MEQ tablet TAKE 1 TABLET BY MOUTH IN THE MORNING, TAKE 1/2 TABLET BY MOUTH IN THE P.M.   Probiotic Product (PROBIOTIC DAILY PO) Take 1 tablet by mouth daily.   pyridOXINE (VITAMIN B-6) 100 MG tablet Take 100 mg by mouth daily.   Respiratory Therapy Supplies (FLUTTER) DEVI Use the flutter valve 10 times when doing it daily.   spironolactone (ALDACTONE) 25 MG tablet Take 1 tablet (25 mg total) by mouth daily. (Patient taking differently: Take 25 mg by mouth every evening.)   tamsulosin (FLOMAX) 0.4 MG CAPS capsule Take 0.4 mg by mouth daily.   Turmeric 500 MG CAPS Take 500 mg by mouth daily.   vitamin B-12 (CYANOCOBALAMIN) 1000 MCG tablet Take 1,000 mcg by mouth daily.    Allergies:   Patient has no known allergies.   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  Review of Systems  Gastrointestinal:  Negative for melena.  EKGs/Labs/Other Test Reviewed:    EKG:  EKG is   ordered today.  The ekg ordered today demonstrates NSR, HR 70, normal axis, first-degree block, PR 250, anteroseptal Q waves, QTC 466, no change from prior trace  Recent Labs: 06/28/2021: Magnesium 2.0; NT-Pro BNP 246 08/19/2021: Hemoglobin 14.9; Platelets 199 08/26/2021: BUN 26; Creatinine, Ser 0.99; Potassium 4.6; Sodium 138   Recent Lipid Panel No results for input(s): CHOL, TRIG, HDL, VLDL, LDLCALC, LDLDIRECT in the last 8760 hours.   Risk Assessment/Calculations:    CHA2DS2-VASc Score = 5   This indicates a 7.2% annual risk of stroke. The patient's score is based upon: CHF History: 1 HTN History: 1 Diabetes History: 0 Stroke History: 0 Vascular Disease History: 1 Age Score: 2 Gender Score: 0        Physical Exam:    VS:  BP 128/60 (BP Location: Left Arm, Patient Position: Sitting, Cuff Size: Normal)    Ht _0  (1.778 m)    Wt 213 lb 3.2 oz (96.7 kg)    SpO2 96%    BMI 30.59 kg/m     Wt Readings from Last 3 Encounters:  12/03/21 213 lb 3.2 oz (96.7 kg)  10/05/21 200 lb (90.7 kg)  09/23/21 208 lb 12.8 oz (94.7 kg)    Constitutional:      Appearance: Healthy appearance. Not in distress.  Neck:     Vascular: JVD normal.  Pulmonary:     Effort: Pulmonary effort is normal.     Breath sounds: No wheezing. No rales.  Cardiovascular:     Normal rate. Regular rhythm. Normal S1. Normal S2.      Murmurs: There is a grade 3/6 systolic murmur at the URSB.  Edema:    Peripheral edema absent.  Abdominal:     Palpations: Abdomen is soft.  Skin:    General: Skin is warm and dry.   Neurological:     General: No focal deficit present.     Mental Status: Alert and oriented to person, place and time.     Cranial Nerves: Cranial nerves are intact.        ASSESSMENT & PLAN:   CAD (coronary artery disease) Coronary artery calcification by prior CT.  Myoview in 10/22 negative for ischemia and low risk.  He has not had any chest discomfort.  His breathing is overall stable.  As noted previously, I suspect his shortness of breath is multifactorial.  No further work-up at this time.  He is not on aspirin as he is on Apixaban.  Continue atorvastatin 10 mg daily.  Afib ([place]) Maintaining sinus rhythm.  He remains on dofetilide 500 mcg twice daily.  QTC is stable.  Recent hemoglobin normal.  Obtain follow-up BMET, magnesium.  Continue apixaban 5 mg twice daily, diltiazem to 40 mg daily, metoprolol tartrate 25 mg every afternoon.  Hypertension Blood pressure is well controlled.  Continue diltiazem to 40 mg daily, metoprolol tartrate 20 mg every afternoon, spironolactone 25 mg daily.  HLD (hyperlipidemia) He remains on atorvastatin 10 mg daily.  (HFpEF) heart failure with preserved ejection fraction (HCC) EF 60-65 by echocardiogram in September.  NYHA IIb-III.  Volume status appears stable.  Continue furosemide 60 mg daily, spironolactone 25 mg daily.  Mitral valve prolapse Echocardiogram in September with mild mitral regurgitation.  Aortic stenosis Moderate aortic stenosis by echocardiogram in September.  Follow-up echocardiogram pending in September 2023.         Dispo:  Return in about 6 months (around 06/02/2022) for  Routine Follow Up w/ Dr. Angelena Form.   Medication Adjustments/Labs and Tests Ordered: Current medicines are reviewed at length with the patient today.  Concerns regarding medicines are outlined above.  Tests Ordered: Orders Placed This Encounter  Procedures   Basic Metabolic Panel (BMET)   Magnesium   EKG 12-Lead   Medication Changes: No orders of the  defined types were placed in this encounter.  Signed, Richardson Dopp, PA-C  12/03/2021 10:13 AM    [facility] [place], [place], [place]  [address] Phone: [phone]; Fax: [phone]

## 2021-12-03 ENCOUNTER — Ambulatory Visit (INDEPENDENT_AMBULATORY_CARE_PROVIDER_SITE_OTHER): Payer: Medicare Other | Admitting: Physician Assistant

## 2021-12-03 ENCOUNTER — Other Ambulatory Visit: Payer: Self-pay

## 2021-12-03 ENCOUNTER — Encounter: Payer: Self-pay | Admitting: Physician Assistant

## 2021-12-03 VITALS — BP 128/60 | Ht 70.0 in | Wt 213.2 lb

## 2021-12-03 DIAGNOSIS — R0602 Shortness of breath: Secondary | ICD-10-CM | POA: Diagnosis not present

## 2021-12-03 DIAGNOSIS — I5032 Chronic diastolic (congestive) heart failure: Secondary | ICD-10-CM | POA: Diagnosis not present

## 2021-12-03 DIAGNOSIS — I4819 Other persistent atrial fibrillation: Secondary | ICD-10-CM

## 2021-12-03 DIAGNOSIS — I341 Nonrheumatic mitral (valve) prolapse: Secondary | ICD-10-CM

## 2021-12-03 DIAGNOSIS — I2510 Atherosclerotic heart disease of native coronary artery without angina pectoris: Secondary | ICD-10-CM | POA: Diagnosis not present

## 2021-12-03 DIAGNOSIS — E782 Mixed hyperlipidemia: Secondary | ICD-10-CM | POA: Diagnosis not present

## 2021-12-03 DIAGNOSIS — I350 Nonrheumatic aortic (valve) stenosis: Secondary | ICD-10-CM | POA: Diagnosis not present

## 2021-12-03 DIAGNOSIS — I10 Essential (primary) hypertension: Secondary | ICD-10-CM | POA: Diagnosis not present

## 2021-12-03 LAB — BASIC METABOLIC PANEL
BUN/Creatinine Ratio: 31 — ABNORMAL HIGH (ref 10–24)
BUN: 27 mg/dL (ref 8–27)
CO2: 24 mmol/L (ref 20–29)
Calcium: 10 mg/dL (ref 8.6–10.2)
Chloride: 103 mmol/L (ref 96–106)
Creatinine, Ser: 0.86 mg/dL (ref 0.76–1.27)
Glucose: 133 mg/dL — ABNORMAL HIGH (ref 70–99)
Potassium: 4.8 mmol/L (ref 3.5–5.2)
Sodium: 140 mmol/L (ref 134–144)
eGFR: 84 mL/min/{1.73_m2} (ref >59)

## 2021-12-03 LAB — MAGNESIUM: Magnesium: 2.1 mg/dL (ref 1.6–2.3)

## 2021-12-03 NOTE — Patient Instructions (Signed)
Medication Instructions:   Your physician recommends that you continue on your current medications as directed. Please refer to the Current Medication list given to you today.  *If you need a refill on your cardiac medications before your next appointment, please call your pharmacy*   Lab Work:  TODAY!!!!  BMET/MAG  If you have labs (blood work) drawn today and your tests are completely normal, you will receive your results only by: [place] (if you have MyChart) OR A paper copy in the mail If you have any lab test that is abnormal or we need to change your treatment, we will call you to review the results.   Testing/Procedures:  None Ordered.    Follow-Up: At [facility], you and your health needs are our priority.  As part of our continuing mission to provide you with exceptional heart care, we have created designated Provider Care Teams.  These Care Teams include your primary Cardiologist (physician) and Advanced Practice Providers (APPs -  Physician Assistants and Nurse Practitioners) who all work together to provide you with the care you need, when you need it.  We recommend signing up for the patient portal called "MyChart".  Sign up information is provided on this After Visit Summary.  MyChart is used to connect with patients for Virtual Visits (Telemedicine).  Patients are able to view lab/test results, encounter notes, upcoming appointments, etc.  Non-urgent messages can be sent to your provider as well.   To learn more about what you can do with MyChart, go to [email or web address].    Your next appointment:   6 month(s)  The format for your next appointment:   In Person  Provider:   [name], MD     Other Instructions  None

## 2021-12-03 NOTE — Assessment & Plan Note (Signed)
Moderate aortic stenosis by echocardiogram in September.  Follow-up echocardiogram pending in September 2023.

## 2021-12-03 NOTE — Assessment & Plan Note (Signed)
Echocardiogram in September with mild mitral regurgitation.

## 2021-12-03 NOTE — Assessment & Plan Note (Signed)
Blood pressure is well controlled.  Continue diltiazem to 40 mg daily, metoprolol tartrate 20 mg every afternoon, spironolactone 25 mg daily.

## 2021-12-03 NOTE — Assessment & Plan Note (Signed)
Coronary artery calcification by prior CT.  Myoview in 10/22 negative for ischemia and low risk.  He has not had any chest discomfort.  His breathing is overall stable.  As noted previously, I suspect his shortness of breath is multifactorial.  No further work-up at this time.  He is not on aspirin as he is on Apixaban.  Continue atorvastatin 10 mg daily.

## 2021-12-03 NOTE — Assessment & Plan Note (Signed)
EF 60-65 by echocardiogram in September.  NYHA IIb-III.  Volume status appears stable.  Continue furosemide 60 mg daily, spironolactone 25 mg daily.

## 2021-12-03 NOTE — Assessment & Plan Note (Signed)
He remains on atorvastatin 10 mg daily.

## 2021-12-03 NOTE — Assessment & Plan Note (Addendum)
Maintaining sinus rhythm.  He remains on dofetilide 500 mcg twice daily.  QTC is stable.  Recent hemoglobin normal.  Obtain follow-up BMET, magnesium.  Continue apixaban 5 mg twice daily, diltiazem to 40 mg daily, metoprolol tartrate 25 mg every afternoon.

## 2021-12-06 DIAGNOSIS — G4733 Obstructive sleep apnea (adult) (pediatric): Secondary | ICD-10-CM | POA: Diagnosis not present

## 2021-12-14 DIAGNOSIS — I10 Essential (primary) hypertension: Secondary | ICD-10-CM | POA: Diagnosis not present

## 2021-12-16 ENCOUNTER — Other Ambulatory Visit: Payer: Self-pay | Admitting: Internal Medicine

## 2021-12-29 ENCOUNTER — Encounter (HOSPITAL_COMMUNITY): Payer: Self-pay | Admitting: Nurse Practitioner

## 2021-12-29 ENCOUNTER — Ambulatory Visit (HOSPITAL_COMMUNITY)
Admission: RE | Admit: 2021-12-29 | Discharge: 2021-12-29 | Disposition: A | Discharge status: Home / Self Care | Payer: Medicare Other | Source: Ambulatory Visit | Attending: Nurse Practitioner | Admitting: Nurse Practitioner

## 2021-12-29 ENCOUNTER — Other Ambulatory Visit: Payer: Self-pay

## 2021-12-29 VITALS — BP 136/80 | HR 83 | Ht 70.0 in | Wt 214.2 lb

## 2021-12-29 DIAGNOSIS — I4819 Other persistent atrial fibrillation: Secondary | ICD-10-CM | POA: Diagnosis not present

## 2021-12-29 DIAGNOSIS — D6869 Other thrombophilia: Secondary | ICD-10-CM

## 2021-12-29 DIAGNOSIS — I4891 Unspecified atrial fibrillation: Secondary | ICD-10-CM | POA: Diagnosis not present

## 2021-12-29 DIAGNOSIS — I110 Hypertensive heart disease with heart failure: Secondary | ICD-10-CM | POA: Diagnosis not present

## 2021-12-29 DIAGNOSIS — I350 Nonrheumatic aortic (valve) stenosis: Secondary | ICD-10-CM | POA: Insufficient documentation

## 2021-12-29 DIAGNOSIS — Z79899 Other long term (current) drug therapy: Secondary | ICD-10-CM | POA: Diagnosis not present

## 2021-12-29 DIAGNOSIS — I509 Heart failure, unspecified: Secondary | ICD-10-CM | POA: Diagnosis not present

## 2021-12-29 MED ORDER — PANTOPRAZOLE SODIUM 40 MG PO TBEC: 40.0000 mg | DELAYED_RELEASE_TABLET | Freq: Every day | ORAL | Status: AC

## 2021-12-29 MED ORDER — ALLOPURINOL 100 MG PO TABS: 200.0000 mg | ORAL_TABLET | Freq: Every day | ORAL | Status: AC

## 2021-12-29 NOTE — Progress Notes (Signed)
Primary Care Physician: Lavone Orn, MD Referring Physician: Richardson Dopp, PA Cardiologist/EP: Dr. Fara Boros Patrick Franco is a 88 y.o. male with a h/o HTN, previous gastric ulcer with hemorrhage, moderate aortic stenosis, h/o CHF, CAD, afib on Tikosyn. He is in the afib clinic as he had a spell of dyspnea at bedtime last night and called EMS. He had just went to bed and was putting  on his CPAP and felt winded. He sat up and tried CPAP again and became short of breath again told his wife to call EMS. The fire department was there in a few minutes when he was still feeling short of breath and placed on monitor which showed SR. EMS arrived, EKG was done and it showed SR as well. His V/S were normal. It was not advised for him to go to the ER. He immediately felt better.  He  states that he may have panicked a little last night.  Episode lasted 25-30 mins. He also states that he has not felt well for the last few months. His PCP called this am to tell him that his HGB has dropped to 10.3 from 14.9 10/22 and is trying to get GI appointment asap. He has noted black stools.  He had a stress test 10/6 which was low risk and an echo in September did show progression of aortic stenosis from mild to moderate.   He is in SR today with stable BP. His weight today is 208 lbs and it was 218 lbs in September.    F/u in afib clinic, 12/29/21. He is staying in [place] with stable qt. He did have a GI w/u last fall and was found to have a gastric ulcer. He was placed on a PPI, and oral iron and now CBC has returned to normal. He has not had any afib. No further dyspnea spells as described above.   Today, he denies symptoms of palpitations, chest pain, shortness of breath, orthopnea, PND, lower extremity edema, dizziness, presyncope, syncope, or neurologic sequela. The patient is tolerating medications without difficulties and is otherwise without complaint today.   Past Medical History:  Diagnosis Date   Aortic  stenosis    Arthritis    Atrial fibrillation (HCC)    BPH (benign prostatic hyperplasia)    Chronic low back pain    Depression    Diverticulitis    Elevated PSA    Foot drop, right    Gait disorder 02/03/2015   Hip pain    History of colonoscopy    History of nuclear stress test    Myoview 10/22: EF 67, no ischemia or infarction; low risk   HOH (hard of hearing)    hearing aid   Hypertension    Lumbar radiculopathy    Mitral valve prolapse    Onychomycosis    Peptic ulcer disease    Sepsis ([place]) 2005   e. coli-after prostate bx   Sleep apnea    Torn ACL    Tracheobronchomalacia    seen on 2019 Chest CT   Past Surgical History:  Procedure Laterality Date   BIOPSY  10/05/2021   Procedure: BIOPSY;  Surgeon: Ronnette Juniper, MD;  Location: [facility];  Service: Gastroenterology;;   CARDIOVERSION N/A 03/27/2020   Procedure: CARDIOVERSION;  Surgeon: Skeet Latch, MD;  Location: [facility];  Service: Cardiovascular;  Laterality: N/A;   CATARACT EXTRACTION Bilateral    ESOPHAGOGASTRODUODENOSCOPY N/A 08/19/2015   Procedure: ESOPHAGOGASTRODUODENOSCOPY (EGD);  Surgeon: Laurence Spates, MD;  Location: [facility];  Service: Endoscopy;  Laterality: N/A;   ESOPHAGOGASTRODUODENOSCOPY N/A 10/05/2021   Procedure: ESOPHAGOGASTRODUODENOSCOPY (EGD);  Surgeon: Ronnette Juniper, MD;  Location: [facility];  Service: Gastroenterology;  Laterality: N/A;   right knee replacement Right 1996   SHOULDER SURGERY     TEE WITHOUT CARDIOVERSION N/A 03/27/2020   Procedure: TRANSESOPHAGEAL ECHOCARDIOGRAM (TEE);  Surgeon: Skeet Latch, MD;  Location: [facility];  Service: Cardiovascular;  Laterality: N/A;   TONSILLECTOMY     TOTAL HIP ARTHROPLASTY Right 08/13/2019   Procedure: RIGHT TOTAL HIP ARTHROPLASTY ANTERIOR APPROACH;  Surgeon: Mcarthur Rossetti, MD;  Location: [facility];  Service: Orthopedics;  Laterality: Right;    Current Outpatient Medications  Medication Sig Dispense Refill    allopurinol (ZYLOPRIM) 100 MG tablet TAKE 1 TABLET ONCE DAILY. (Patient taking differently: Take 200 mg by mouth daily.) 90 tablet 0   apixaban (ELIQUIS) 5 MG TABS tablet Take 1 tablet (5 mg total) by mouth 2 (two) times daily. 180 tablet 3   atorvastatin (LIPITOR) 10 MG tablet Take 10 mg by mouth daily.   10   diltiazem (CARDIZEM CD) 240 MG 24 hr capsule TAKE (1) CAPSULE DAILY. 30 capsule 6   dofetilide (TIKOSYN) 500 MCG capsule Take 1 capsule (500 mcg total) by mouth 2 (two) times daily. 180 capsule 3   dutasteride (AVODART) 0.5 MG capsule Take 0.5 mg by mouth daily.  2   furosemide (LASIX) 40 MG tablet Take 1.5 tablets (60 mg total) by mouth daily. TAKE 1 TABLET TWICE DAILY FOR 3 DAYS THEN TAKE 1&1/2 TABLETS DAILY THEREAFTER. (Patient taking differently: Take 60 mg by mouth daily.) 135 tablet 3   metoprolol tartrate (LOPRESSOR) 25 MG tablet Take 1 tablet (25 mg total) by mouth daily. (Patient taking differently: Take 25 mg by mouth every evening.) 90 tablet 3   mirtazapine (REMERON) 30 MG tablet TAKE ONE TABLET AT BEDTIME. 90 tablet 0   Multiple Vitamin (MULTIVITAMIN) tablet Take 1 tablet by mouth daily.     Omega-3 Fatty Acids (FISH OIL) 1200 MG CAPS Take 1,200 mg by mouth daily.     pantoprazole (PROTONIX) 40 MG tablet Take 1 tablet (40 mg total) by mouth 2 (two) times daily. 60 tablet 1   Polyethyl Glycol-Propyl Glycol (SYSTANE) 0.4-0.3 % SOLN Place 1 drop into both eyes 2 (two) times daily.     potassium chloride SA (KLOR-CON) 20 MEQ tablet TAKE 1 TABLET BY MOUTH IN THE MORNING, TAKE 1/2 TABLET BY MOUTH IN THE P.M. 135 tablet 3   Probiotic Product (PROBIOTIC DAILY PO) Take 1 tablet by mouth daily.     pyridOXINE (VITAMIN B-6) 100 MG tablet Take 100 mg by mouth daily.     Respiratory Therapy Supplies (FLUTTER) DEVI Use the flutter valve 10 times when doing it daily. 1 each 0   spironolactone (ALDACTONE) 25 MG tablet Take 1 tablet (25 mg total) by mouth daily. (Patient taking differently: Take  25 mg by mouth every evening.) 90 tablet 2   tamsulosin (FLOMAX) 0.4 MG CAPS capsule Take 0.4 mg by mouth daily.  2   Turmeric 500 MG CAPS Take 500 mg by mouth daily.     vitamin B-12 (CYANOCOBALAMIN) 1000 MCG tablet Take 1,000 mcg by mouth daily.     No current facility-administered medications for this encounter.    No Known Allergies  Social History   Socioeconomic History   Marital status: Married    Spouse name: Not on file   Number of children: 2  Years of education: Not on file   Highest education level: Not on file  Occupational History   Occupation: Retired Social research officer, government  Tobacco Use   Smoking status: Former    Packs/day: 0.50    Types: Cigarettes    Start date: 1960    Quit date: 1961    Years since quitting: 62.1   Smokeless tobacco: Never   Tobacco comments:    Pt smoked for 33month when in [employer], [employer]  Vaping Use: Never used  Substance and Sexual Activity   Alcohol use: Yes    Alcohol/week: 14.0 - 21.0 standard drinks    Types: 14 - 21 Glasses of wine per week    Comment: 2-3 glasses of wine nightly 09/23/2021   Drug use: Never   Sexual activity: Yes  Other Topics Concern   Not on file  Social History Narrative   Patient is right handed.   Patient drinks 1 cup of coffee per day.   Social Determinants of Health   Financial Resource Strain: Not on file  Food Insecurity: Not on file  Transportation Needs: Not on file  Physical Activity: Not on file  Stress: Not on file  Social Connections: Not on file  Intimate Partner Violence: Not on file    Family History  Problem Relation Age of Onset   Pneumonia Mother 819  Cancer - Lung Father 544  Multiple myeloma Brother 523   ROS- All systems are reviewed and negative except as per the HPI above  Physical Exam: Vitals:   12/29/21 0925  Weight: 97.2 kg  Height: 5' 10"  (1.778 m)   Wt Readings from Last 3 Encounters:  12/29/21 97.2 kg  12/03/21 96.7 kg  10/05/21 90.7 kg     Labs: Lab Results  Component Value Date   NA 140 12/03/2021   K 4.8 12/03/2021   CL 103 12/03/2021   CO2 24 12/03/2021   GLUCOSE 133 (H) 12/03/2021   BUN 27 12/03/2021   CREATININE 0.86 12/03/2021   CALCIUM 10.0 12/03/2021   MG 2.1 12/03/2021   No results found for: INR Lab Results  Component Value Date   CHOL 96 03/06/2020   HDL 48 03/06/2020   LDLCALC 38 03/06/2020   TRIG 50 03/06/2020     GEN- The patient is well appearing, alert and oriented x 3 today.   Head- normocephalic, atraumatic Eyes-  Sclera clear, conjunctiva pink Ears- hearing intact Oropharynx- clear Neck- supple, no JVP Lymph- no cervical lymphadenopathy Lungs- Clear to ausculation bilaterally, normal work of breathing Heart- Regular rate and rhythm, no murmurs, rubs or gallops, PMI not laterally displaced GI- soft, NT, ND, + BS Extremities- no clubbing, cyanosis, or edema MS- no significant deformity or atrophy Skin- no rash or lesion Psych- euthymic mood, full affect Neuro- strength and sensation are intact  EKG-SR with first degree AV block at 83 bpm, pr int 276 ms, qrs int 84 ms, qtc 453 ms   Epic records reviewed    Assessment and Plan:  1.Afib Maintaining SR with Tikosyn Continue 500 mcg bid  Qt stable, labs- creatinine, K+/mag stable when checked in January   2. CHA2DS2VASc  score of at least 5 Continue  apixaban 5 mg bid   3. Moderate aortic stenosis 08/13/21 (mild on previous  echo)  Pending repeat echo fall of 2023 Per  Dr. MAngelena Form   4, Newly found anemia fall of 2022 Per pt endoscopy showed gastric ulcer now resolved with PPI  and oral iron   F/u with Dr. Angelena Form 04/13/22 and in 6 months afib clinic for [place]. , [facility] [address] [place], [place] [address] [phone]

## 2021-12-31 DIAGNOSIS — R0981 Nasal congestion: Secondary | ICD-10-CM | POA: Diagnosis not present

## 2021-12-31 DIAGNOSIS — R059 Cough, unspecified: Secondary | ICD-10-CM | POA: Diagnosis not present

## 2021-12-31 DIAGNOSIS — J069 Acute upper respiratory infection, unspecified: Secondary | ICD-10-CM | POA: Diagnosis not present

## 2022-01-11 DIAGNOSIS — I10 Essential (primary) hypertension: Secondary | ICD-10-CM | POA: Diagnosis not present

## 2022-02-11 DIAGNOSIS — I10 Essential (primary) hypertension: Secondary | ICD-10-CM | POA: Diagnosis not present

## 2022-03-03 ENCOUNTER — Other Ambulatory Visit: Payer: Self-pay | Admitting: Physician Assistant

## 2022-03-03 DIAGNOSIS — D509 Iron deficiency anemia, unspecified: Secondary | ICD-10-CM | POA: Diagnosis not present

## 2022-03-03 DIAGNOSIS — R1900 Intra-abdominal and pelvic swelling, mass and lump, unspecified site: Secondary | ICD-10-CM

## 2022-03-03 DIAGNOSIS — I5030 Unspecified diastolic (congestive) heart failure: Secondary | ICD-10-CM | POA: Diagnosis not present

## 2022-03-03 DIAGNOSIS — R635 Abnormal weight gain: Secondary | ICD-10-CM | POA: Diagnosis not present

## 2022-03-03 DIAGNOSIS — K279 Peptic ulcer, site unspecified, unspecified as acute or chronic, without hemorrhage or perforation: Secondary | ICD-10-CM | POA: Diagnosis not present

## 2022-03-03 DIAGNOSIS — I2510 Atherosclerotic heart disease of native coronary artery without angina pectoris: Secondary | ICD-10-CM | POA: Diagnosis not present

## 2022-03-03 DIAGNOSIS — R0602 Shortness of breath: Secondary | ICD-10-CM | POA: Diagnosis not present

## 2022-03-03 DIAGNOSIS — I350 Nonrheumatic aortic (valve) stenosis: Secondary | ICD-10-CM | POA: Diagnosis not present

## 2022-03-04 DIAGNOSIS — K219 Gastro-esophageal reflux disease without esophagitis: Secondary | ICD-10-CM | POA: Diagnosis not present

## 2022-03-04 DIAGNOSIS — N401 Enlarged prostate with lower urinary tract symptoms: Secondary | ICD-10-CM | POA: Diagnosis not present

## 2022-03-04 DIAGNOSIS — I10 Essential (primary) hypertension: Secondary | ICD-10-CM | POA: Diagnosis not present

## 2022-03-04 DIAGNOSIS — I480 Paroxysmal atrial fibrillation: Secondary | ICD-10-CM | POA: Diagnosis not present

## 2022-03-04 DIAGNOSIS — I5030 Unspecified diastolic (congestive) heart failure: Secondary | ICD-10-CM | POA: Diagnosis not present

## 2022-03-08 ENCOUNTER — Ambulatory Visit
Admission: RE | Admit: 2022-03-08 | Discharge: 2022-03-08 | Disposition: A | Discharge status: Home / Self Care | Payer: Medicare Other | Source: Ambulatory Visit | Attending: Physician Assistant | Admitting: Physician Assistant

## 2022-03-08 DIAGNOSIS — R1900 Intra-abdominal and pelvic swelling, mass and lump, unspecified site: Secondary | ICD-10-CM

## 2022-03-08 DIAGNOSIS — R188 Other ascites: Secondary | ICD-10-CM | POA: Diagnosis not present

## 2022-03-31 DIAGNOSIS — I350 Nonrheumatic aortic (valve) stenosis: Secondary | ICD-10-CM | POA: Diagnosis not present

## 2022-03-31 DIAGNOSIS — I5030 Unspecified diastolic (congestive) heart failure: Secondary | ICD-10-CM | POA: Diagnosis not present

## 2022-03-31 DIAGNOSIS — R269 Unspecified abnormalities of gait and mobility: Secondary | ICD-10-CM | POA: Diagnosis not present

## 2022-03-31 DIAGNOSIS — I10 Essential (primary) hypertension: Secondary | ICD-10-CM | POA: Diagnosis not present

## 2022-03-31 DIAGNOSIS — I480 Paroxysmal atrial fibrillation: Secondary | ICD-10-CM | POA: Diagnosis not present

## 2022-03-31 DIAGNOSIS — I700 Atherosclerosis of aorta: Secondary | ICD-10-CM | POA: Diagnosis not present

## 2022-03-31 DIAGNOSIS — G4733 Obstructive sleep apnea (adult) (pediatric): Secondary | ICD-10-CM | POA: Diagnosis not present

## 2022-03-31 DIAGNOSIS — I2510 Atherosclerotic heart disease of native coronary artery without angina pectoris: Secondary | ICD-10-CM | POA: Diagnosis not present

## 2022-03-31 DIAGNOSIS — Z0000 Encounter for general adult medical examination without abnormal findings: Secondary | ICD-10-CM | POA: Diagnosis not present

## 2022-03-31 DIAGNOSIS — Z23 Encounter for immunization: Secondary | ICD-10-CM | POA: Diagnosis not present

## 2022-03-31 DIAGNOSIS — Z1331 Encounter for screening for depression: Secondary | ICD-10-CM | POA: Diagnosis not present

## 2022-03-31 DIAGNOSIS — K279 Peptic ulcer, site unspecified, unspecified as acute or chronic, without hemorrhage or perforation: Secondary | ICD-10-CM | POA: Diagnosis not present

## 2022-03-31 DIAGNOSIS — M109 Gout, unspecified: Secondary | ICD-10-CM | POA: Diagnosis not present

## 2022-03-31 DIAGNOSIS — N401 Enlarged prostate with lower urinary tract symptoms: Secondary | ICD-10-CM | POA: Diagnosis not present

## 2022-04-05 DIAGNOSIS — D692 Other nonthrombocytopenic purpura: Secondary | ICD-10-CM | POA: Diagnosis not present

## 2022-04-05 DIAGNOSIS — Z85828 Personal history of other malignant neoplasm of skin: Secondary | ICD-10-CM | POA: Diagnosis not present

## 2022-04-05 DIAGNOSIS — L821 Other seborrheic keratosis: Secondary | ICD-10-CM | POA: Diagnosis not present

## 2022-04-05 DIAGNOSIS — L570 Actinic keratosis: Secondary | ICD-10-CM | POA: Diagnosis not present

## 2022-04-05 DIAGNOSIS — L853 Xerosis cutis: Secondary | ICD-10-CM | POA: Diagnosis not present

## 2022-04-12 NOTE — Progress Notes (Unsigned)
No chief complaint on file.  History of Present Illness: 88 yo male with history of CAD, aortic stenosis, HTN, mitral valve prolapse and sleep apnea here today for cardiac follow up. He was seen in our office for evaluation of aortic stenosis and abnormal EKG in May 2018. He was having a colonoscopy on 04/04/17 and was found to have an irregular heart rhythm. He was sent to his primary care office where an EKG showed sinus rhythm with PACs. Echo November 2017 with normal LV systolic function, HERD=40-81%, grade 1 diastolic dysfunction, functionally bicuspid aortic valve with moderate stenosis (mean gradient 21 mmHg, AVA 1.4cm2). There was mild mitral regurgitation. At his first visit here in May 2018, he described dyspnea and less energy but no chest pain or dizziness. Nuclear stress test May 2018 with no ischemia. Echo July 2020 showed normal LV systolic function, mild aortic valve stenosis (mean gradient 17 mmHg), mild AI. Atrial flutter in April 2021. He has been on Cardizem, Eliquis and Tikosyn. His atrial flutter has been followed by Dr. Rayann Heman in the EP clinic. Last echo in September 2022 with LVEF=60-65%. Mild mitral regurgitation. Moderate aortic stenosis with mean gradient 26.5 mmHg, peak gradient 55.19mHg, AVA 0.95 cm2, DI 0.27, SVI 35. Nuclear stress test October 2022 without ischemia.   He is here today for follow up. The patient denies any chest pain, dyspnea, palpitations, lower extremity edema, orthopnea, PND, dizziness, near syncope or syncope.   Primary Care Physician: GLavone Orn MD  Past Medical History:  Diagnosis Date   Aortic stenosis    Arthritis    [place]rial [place]    BPH (benign prostatic hyperplasia)    Chronic low back pain    Depression    Diverticulitis    Elevated PSA    Foot drop, right    Gait disorder 02/03/2015   Hip pain    History of colonoscopy    History of nuclear stress test    Myoview 10/22: EF 67, no ischemia or infarction; low risk    HOH (hard of hearing)    hearing aid   Hypertension    Lumbar radiculopathy    Mitral valve prolapse    Onychomycosis    Peptic ulcer disease    [place]   e. coli-after prostate bx   Sleep apnea    Torn ACL    Tracheobronchomalacia    seen on 2019 Chest CT    Past Surgical History:  Procedure Laterality Date   BIOPSY  10/05/2021   Procedure: BIOPSY;  Surgeon: KRonnette Juniper MD;  [facility];  Service: Gastroenterology;;   CARDIOVERSION N/A 03/27/2020   Procedure: CARDIOVERSION;  Surgeon: RSkeet Latch MD;  [place] [facility] [facility]  Service: Cardiovascular;  Laterality: N/A;   CATARACT EXTRACTION Bilateral    ESOPHAGOGASTRODUODENOSCOPY N/A 08/19/2015   Procedure: ESOPHAGOGASTRODUODENOSCOPY (EGD);  Surgeon: JLaurence Spates MD;  [facility]  Service: Endoscopy;  Laterality: N/A;   ESOPHAGOGASTRODUODENOSCOPY N/A 10/05/2021   Procedure: ESOPHAGOGASTRODUODENOSCOPY (EGD);  Surgeon: KRonnette Juniper MD;  [facility];  Service: Gastroenterology;  Laterality: N/A;   right knee replacement Right 1996   SHOULDER SURGERY     TEE WITHOUT CARDIOVERSION N/A 03/27/2020   Procedure: TRANSESOPHAGEAL ECHOCARDIOGRAM (TEE);  Surgeon: RSkeet Latch MD;  [place] [facility]  Service: Cardiovascular;  Laterality: N/A;   TONSILLECTOMY     TOTAL HIP ARTHROPLASTY Right 08/13/2019   Procedure: RIGHT TOTAL HIP ARTHROPLASTY ANTERIOR APPROACH;  Surgeon: BMcarthur Rossetti MD;  [facility]  Service: Orthopedics;  Laterality: Right;    Current Outpatient Medications  Medication Sig Dispense Refill   allopurinol (ZYLOPRIM) 100 MG tablet Take 2 tablets (200 mg total) by mouth daily.     apixaban (ELIQUIS) 5 MG TABS tablet Take 1 tablet (5 mg total) by mouth 2 (two) times daily. 180 tablet 3   atorvastatin (LIPITOR) 10 MG tablet Take 10 mg by mouth daily.   10   diltiazem (CARDIZEM CD) 240 MG 24 hr capsule TAKE (1) CAPSULE DAILY. 30 capsule 6   dofetilide  (TIKOSYN) 500 MCG capsule Take 1 capsule (500 mcg total) by mouth 2 (two) times daily. 180 capsule 3   dutasteride (AVODART) 0.5 MG capsule Take 0.5 mg by mouth daily.  2   furosemide (LASIX) 40 MG tablet Take 1.5 tablets (60 mg total) by mouth daily. TAKE 1 TABLET TWICE DAILY FOR 3 DAYS THEN TAKE 1&1/2 TABLETS DAILY THEREAFTER. (Patient taking differently: Take 60 mg by mouth daily.) 135 tablet 3   metoprolol tartrate (LOPRESSOR) 25 MG tablet Take 1 tablet (25 mg total) by mouth daily. (Patient taking differently: Take 25 mg by mouth every evening.) 90 tablet 3   mirtazapine (REMERON) 30 MG tablet TAKE ONE TABLET AT BEDTIME. 90 tablet 0   Multiple Vitamin (MULTIVITAMIN) tablet Take 1 tablet by mouth daily.     Omega-3 Fatty Acids (FISH OIL) 1200 MG CAPS Take 1,200 mg by mouth daily.     pantoprazole (PROTONIX) 40 MG tablet Take 1 tablet (40 mg total) by mouth daily.     Polyethyl Glycol-Propyl Glycol (SYSTANE) 0.4-0.3 % SOLN Place 1 drop into both eyes 2 (two) times daily.     potassium chloride SA (KLOR-CON) 20 MEQ tablet TAKE 1 TABLET BY MOUTH IN THE MORNING, TAKE 1/2 TABLET BY MOUTH IN THE P.M. 135 tablet 3   Probiotic Product (PROBIOTIC DAILY PO) Take 1 tablet by mouth daily.     pyridOXINE (VITAMIN B-6) 100 MG tablet Take 100 mg by mouth daily.     Respiratory Therapy Supplies (FLUTTER) DEVI Use the flutter valve 10 times when doing it daily. 1 each 0   spironolactone (ALDACTONE) 25 MG tablet Take 1 tablet (25 mg total) by mouth daily. (Patient taking differently: Take 25 mg by mouth every evening.) 90 tablet 2   tamsulosin (FLOMAX) 0.4 MG CAPS capsule Take 0.4 mg by mouth daily.  2   Turmeric 500 MG CAPS Take 500 mg by mouth daily.     vitamin B-12 (CYANOCOBALAMIN) 1000 MCG tablet Take 1,000 mcg by mouth daily.     No current facility-administered medications for this visit.    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   Patient drinks 1 cup of coffee per day.   Social Determinants of Health   Financial Resource Strain: Not on file  Food Insecurity: Not on file  Transportation Needs: Not on file  Physical Activity: Not on file  Stress: Not on file  Social Connections: Not on file  Intimate Partner Violence: Not on file    Family History  Problem Relation Age of Onset   Pneumonia Mother 9   Cancer - Lung Father 8   Multiple myeloma Brother 21    Review of Systems:  As stated in the HPI and otherwise negative.   There were no vitals taken for this visit.  Physical Examination: General: Well developed, well nourished, NAD  HEENT: OP clear, mucus membranes moist  SKIN: warm, dry. No rashes. Neuro: No focal deficits  Musculoskeletal: Muscle strength 5/5 all ext  Psychiatric: Mood and affect normal  Neck: No JVD, no carotid bruits, no thyromegaly, no lymphadenopathy.  Lungs:Clear bilaterally, no wheezes, rhonci, crackles Cardiovascular: Regular rate and rhythm. *** Systolic murmur.  Abdomen:Soft. Bowel sounds present. Non-tender.  Extremities: No lower extremity edema. Pulses are 2 + in the bilateral DP/PT.  Echo 07/16/21:    1.  Left ventricular ejection fraction, by estimation, is 60 to 65%. The  left ventricle has normal function. The left ventricle has no regional  wall motion abnormalities. There is mild left ventricular hypertrophy.  Left ventricular diastolic parameters  are consistent with Grade I diastolic dysfunction (impaired relaxation).   2. Right ventricular systolic function is normal. The right ventricular  size is normal.   3. Left atrial size was severely dilated.   4. The mitral valve is normal in structure. Mild mitral valve  regurgitation. No evidence of mitral stenosis. Severe mitral annular  calcification.   5. The aortic valve is calcified. There is severe calcifcation of the  aortic valve. There is severe thickening of the aortic valve. Aortic valve  regurgitation is not visualized. Moderate aortic valve stenosis. Aortic  valve mean gradient measures 26.5  mmHg. Aortic valve Vmax measures 3.73 m/s.   6. The inferior vena cava is normal in size with greater than 50%  respiratory variability, suggesting right atrial pressure of 3 mmHg.   FINDINGS   Left Ventricle: Left ventricular ejection fraction, by estimation, is 60  to 65%. The left ventricle has normal function. The left ventricle has no  regional wall motion abnormalities. The left ventricular internal cavity  size was normal in size. There is   mild left ventricular hypertrophy. Left ventricular diastolic parameters  are consistent with Grade I diastolic dysfunction (impaired relaxation).   Right Ventricle: The right ventricular size is normal. No increase in  right ventricular wall thickness. Right ventricular systolic function is  normal.   Left Atrium: Left atrial size was severely dilated.   Right Atrium: Right atrial size was normal in size.   Pericardium: There is no evidence of pericardial effusion.   Mitral Valve: The mitral valve is normal in structure. Severe mitral  annular calcification. Mild mitral valve  regurgitation. No evidence of  mitral valve stenosis.   Tricuspid Valve: The tricuspid valve is normal in structure. Tricuspid  valve regurgitation is not demonstrated. No evidence of tricuspid  stenosis.  Aortic Valve: The aortic valve is calcified. There is severe calcifcation  of the aortic valve. There is severe thickening of the aortic valve.  Aortic valve regurgitation is not visualized. Moderate aortic stenosis is  present. Aortic valve mean gradient  measures 26.5 mmHg. Aortic valve peak gradient measures 55.7 mmHg. Aortic  valve area, by VTI measures 0.95 cm.   Pulmonic Valve: The pulmonic valve was normal in structure. Pulmonic valve  regurgitation is not visualized. No evidence of pulmonic stenosis.   Aorta: The aortic root is normal in size and structure.   Venous: The inferior vena cava is normal in size with greater than 50%  respiratory variability, suggesting right atrial pressure of 3 mmHg.   IAS/Shunts: No atrial level shunt detected by color flow Doppler.      LEFT VENTRICLE  PLAX 2D  LVIDd:         4.90 cm  Diastology  LVIDs:         2.50 cm  LV e' medial:    4.58 cm/s  LV PW:         1.30 cm  LV E/e' medial:  26.6  LV IVS:        1.30 cm  LV e' lateral:   6.53 cm/s  LVOT diam:     2.10 cm  LV E/e' lateral: 18.6  LV SV:         74  LV SV Index:   35  LVOT Area:     3.46 cm      RIGHT VENTRICLE  RV Basal diam:  3.30 cm  RV S prime:     18.50 cm/s  TAPSE (M-mode): 1.4 cm   LEFT ATRIUM              Index       RIGHT ATRIUM           Index  LA diam:        5.20 cm  2.43 cm/m  RA Area:     17.20 cm  LA Vol (A2C):   92.6 ml  43.30 ml/m RA Volume:   35.20 ml  16.46 ml/m  LA Vol (A4C):   124.0 ml 57.98 ml/m  LA Biplane Vol: 108.0 ml 50.50 ml/m   AORTIC VALVE  AV Area (Vmax):    0.94 cm  AV Area (Vmean):   1.02 cm  AV Area (VTI):     0.95 cm  AV Vmax:           373.00 cm/s  AV Vmean:          233.500 cm/s  AV VTI:            0.782 m  AV  Peak Grad:      55.7 mmHg  AV Mean Grad:      26.5 mmHg  LVOT Vmax:         101.00 cm/s  LVOT Vmean:        68.600 cm/s  LVOT VTI:          0.214 m  LVOT/AV VTI ratio: 0.27     AORTA  Ao Root diam: 3.40 cm   MITRAL VALVE  MV Area (PHT): 2.62 cm     SHUNTS  MV Decel Time: 290 msec     Systemic VTI:  0.21 m  MR Peak grad: 134.9 mmHg    Systemic Diam: 2.10 cm  MR Mean grad: 75.3 mmHg  MR Vmax:      580.67 cm/s  MR Vmean:     403.0 cm/s  MV E velocity: 121.67 cm/s  MV A velocity: 124.33 cm/s  MV E/A ratio:  0.98   EKG:  EKG is *** ordered today. The ekg ordered today demonstrates   Recent Labs: 06/28/2021: NT-Pro BNP 246 08/19/2021: Hemoglobin 14.9; Platelets 199 12/03/2021: BUN 27; Creatinine, Ser 0.86; Magnesium 2.1; Potassium 4.8; Sodium 140   Lipid Panel    Component Value Date/Time   CHOL 96 03/06/2020 0330   TRIG 50 03/06/2020 0330   HDL 48 03/06/2020 0330   CHOLHDL 2.0 03/06/2020 0330   VLDL 10 03/06/2020 0330   LDLCALC 38 03/06/2020 0330     Wt Readings from Last 3 Encounters:  12/29/21 214 lb 3.2 oz (97.2 kg)  12/03/21 213 lb 3.2 oz (96.7 kg)  10/05/21 200 lb (90.7 kg)     Other studies Reviewed: Additional studies/ records that were reviewed today include: . Review of the above records demonstrates:   Assessment and Plan:   Aortic stenosis: He has moderate aortic stenosis by echo in September 2022. Will repeat echo now.   2.  Chronic diastolic CHF:   3.  CAD without angina: Evidence of CAD by prior CTA. He has no angina. No ischemia on stress test in October 2022. Will continue beta blocker and statin. He is not on an ASA since he is on Eliquis.   4. Atrial fib/flutter: Sinus today. Continue Tikosyn, Eliquis and Cardizem.    Current medicines are reviewed at length with the patient today.  The patient does not have concerns regarding medicines.  The following changes have been made:  no change  Labs/ tests ordered today include:   No orders of  the defined types were placed in this encounter.  Disposition:   F/U with me in *** months   Signed, [name], MD 04/12/2022 2:55 PM    [address], [place], [place]  [address] Phone: [phone]; Fax: [phone]

## 2022-04-13 ENCOUNTER — Encounter: Payer: Self-pay | Admitting: Cardiovascular Disease

## 2022-04-13 ENCOUNTER — Ambulatory Visit (INDEPENDENT_AMBULATORY_CARE_PROVIDER_SITE_OTHER): Payer: Medicare Other | Admitting: Cardiovascular Disease

## 2022-04-13 VITALS — BP 126/70 | HR 69 | Ht 70.0 in | Wt 220.2 lb

## 2022-04-13 DIAGNOSIS — I350 Nonrheumatic aortic (valve) stenosis: Secondary | ICD-10-CM | POA: Diagnosis not present

## 2022-04-13 DIAGNOSIS — I4819 Other persistent atrial fibrillation: Secondary | ICD-10-CM | POA: Diagnosis not present

## 2022-04-13 DIAGNOSIS — I2510 Atherosclerotic heart disease of native coronary artery without angina pectoris: Secondary | ICD-10-CM | POA: Diagnosis not present

## 2022-04-13 DIAGNOSIS — I10 Essential (primary) hypertension: Secondary | ICD-10-CM | POA: Diagnosis not present

## 2022-04-13 DIAGNOSIS — I5032 Chronic diastolic (congestive) heart failure: Secondary | ICD-10-CM | POA: Diagnosis not present

## 2022-04-13 NOTE — Patient Instructions (Signed)
Medication Instructions:  Your physician recommends that you continue on your current medications as directed. Please refer to the Current Medication list given to you today.  *If you need a refill on your cardiac medications before your next appointment, please call your pharmacy*   Lab Work: NONE If you have labs (blood work) drawn today and your tests are completely normal, you will receive your results only by: [facility] (if you have MyChart) OR A paper copy in the mail If you have any lab test that is abnormal or we need to change your treatment, we will call you to review the results.   Testing/Procedures: Your physician has requested that you have an echocardiogram. Echocardiography is a painless test that uses sound waves to create images of your heart. It provides your doctor with information about the size and shape of your heart and how well your heart's chambers and valves are working. This procedure takes approximately one hour. There are no restrictions for this procedure.   Follow-Up: In about 4 weeks - see below.   Important Information About Sugar

## 2022-04-20 DIAGNOSIS — H353131 Nonexudative age-related macular degeneration, bilateral, early dry stage: Secondary | ICD-10-CM | POA: Diagnosis not present

## 2022-04-20 DIAGNOSIS — H04129 Dry eye syndrome of unspecified lacrimal gland: Secondary | ICD-10-CM | POA: Diagnosis not present

## 2022-04-20 DIAGNOSIS — M5416 Radiculopathy, lumbar region: Secondary | ICD-10-CM | POA: Diagnosis not present

## 2022-04-20 DIAGNOSIS — M545 Low back pain, unspecified: Secondary | ICD-10-CM | POA: Diagnosis not present

## 2022-04-20 DIAGNOSIS — I10 Essential (primary) hypertension: Secondary | ICD-10-CM | POA: Diagnosis not present

## 2022-04-20 DIAGNOSIS — H3530 Unspecified macular degeneration: Secondary | ICD-10-CM | POA: Diagnosis not present

## 2022-04-20 DIAGNOSIS — M25551 Pain in right hip: Secondary | ICD-10-CM | POA: Diagnosis not present

## 2022-04-20 DIAGNOSIS — H524 Presbyopia: Secondary | ICD-10-CM | POA: Diagnosis not present

## 2022-04-20 DIAGNOSIS — Z9849 Cataract extraction status, unspecified eye: Secondary | ICD-10-CM | POA: Diagnosis not present

## 2022-05-03 ENCOUNTER — Other Ambulatory Visit: Payer: Self-pay | Admitting: *Deleted

## 2022-05-03 DIAGNOSIS — M5416 Radiculopathy, lumbar region: Secondary | ICD-10-CM | POA: Diagnosis not present

## 2022-05-03 DIAGNOSIS — M545 Low back pain, unspecified: Secondary | ICD-10-CM | POA: Diagnosis not present

## 2022-05-03 DIAGNOSIS — M25551 Pain in right hip: Secondary | ICD-10-CM | POA: Diagnosis not present

## 2022-05-03 DIAGNOSIS — I484 Atypical atrial flutter: Secondary | ICD-10-CM

## 2022-05-03 MED ORDER — APIXABAN 5 MG PO TABS: 5.0000 mg | ORAL_TABLET | Freq: Two times a day (BID) | ORAL | 2 refills | Status: DC

## 2022-05-03 NOTE — Telephone Encounter (Signed)
Eliquis '5mg'$  paper refill request received. Patient is 88 years old, weight-99.9kg, Crea-0.86 on 12/03/2021, Diagnosis-Afib, and last seen by Dr. Angelena Form on 04/13/2022. Dose is appropriate based on dosing criteria. Will send in refill to requested pharmacy.

## 2022-05-04 ENCOUNTER — Ambulatory Visit (HOSPITAL_COMMUNITY): Payer: Medicare Other | Attending: Cardiology

## 2022-05-04 DIAGNOSIS — I350 Nonrheumatic aortic (valve) stenosis: Secondary | ICD-10-CM

## 2022-05-04 LAB — ECHOCARDIOGRAM COMPLETE
AR max vel: 0.93 cm2
AV Area VTI: 1 cm2
AV Area mean vel: 0.85 cm2
AV Mean grad: 38.4 mmHg
AV Peak grad: 62.3 mmHg
Ao pk vel: 3.95 m/s
Area-P 1/2: 3.95 cm2
MV M vel: 5.72 m/s
MV Peak grad: 130.8 mmHg
MV VTI: 1.62 cm2
S' Lateral: 2.7 cm

## 2022-05-05 DIAGNOSIS — M5416 Radiculopathy, lumbar region: Secondary | ICD-10-CM | POA: Diagnosis not present

## 2022-05-05 DIAGNOSIS — M545 Low back pain, unspecified: Secondary | ICD-10-CM | POA: Diagnosis not present

## 2022-05-05 DIAGNOSIS — M25551 Pain in right hip: Secondary | ICD-10-CM | POA: Diagnosis not present

## 2022-05-09 NOTE — Progress Notes (Signed)
Chief Complaint  Patient presents with   Follow-up    Aortic stenosis   History of Present Illness: 88 yo male with history of CAD, aortic stenosis, HTN, mitral valve prolapse, chronic diastolic CHF, atrial fibrillation and sleep apnea here today for cardiac follow up. He was seen in our office for evaluation of aortic stenosis and abnormal EKG in May 2018. He was having a colonoscopy on 04/04/17 and was found to have an irregular heart rhythm. He was sent to his primary care office where an EKG showed sinus rhythm with PACs. Echo November 2017 with normal LV systolic function, TMYT=11-73%, grade 1 diastolic dysfunction, functionally bicuspid aortic valve with moderate stenosis (mean gradient 21 mmHg, AVA 1.4cm2). There was mild mitral regurgitation. At his first visit here in May 2018, he described dyspnea and less energy but no chest pain or dizziness. Nuclear stress test May 2018 with no ischemia. Chest CT in February 2019 with evidence of CAD. Echo July 2020 showed normal LV systolic function, mild aortic valve stenosis (mean gradient 17 mmHg), mild AI. Atrial flutter in April 2021. He has been on Cardizem, Eliquis and Tikosyn. His atrial flutter has been followed by Dr. Rayann Heman in the EP clinic. Echo in September 2022 with LVEF=60-65%. Mild mitral regurgitation. Moderate aortic stenosis with mean gradient 26.5 mmHg, peak gradient 55.98mHg, AVA 0.95 cm2, DI 0.27, SVI 35. Nuclear stress test October 2022 without ischemia. Last echo 05/04/22 with LVEF=60-65%. Mild LVH. Normal RV function. Severe dilation left atrium. Mild MR. Mild MS. His aortic stenosis has progressed. The mean gradient is now 38.4 mmHg with peak gradient 62.3 mmHg, AVA 0.85 cm2, DI 0.26. This is now consistent with severe aortic stenosis.   He tells me today that he has had progressive dyspnea on exertion and fatigue. No chest pain. [place] He is a retired cSocial research officer, government   Primary Care Physician:  GLavone Orn MD  Past Medical History:  Diagnosis Date   Aortic stenosis    Arthritis    [place]    BPH (benign prostatic hyperplasia)    Chronic low back pain    Depression    Diverticulitis    Elevated PSA    Foot drop, right    Gait disorder 02/03/2015   Hip pain    History of colonoscopy    History of nuclear stress test    Myoview 10/22: EF 67, no ischemia or infarction; low risk   HOH (hard of hearing)    hearing aid   Hypertension    Lumbar radiculopathy    Mitral valve prolapse    Onychomycosis    Peptic ulcer disease    Sepsis ([place]   e. coli-after prostate bx   Sleep apnea    Torn ACL    Tracheobronchomalacia    seen on 2019 Chest CT    Past Surgical History:  Procedure Laterality Date   BIOPSY  10/05/2021   Procedure: BIOPSY;  Surgeon: KRonnette Juniper MD;  [facility];  Service: Gastroenterology;;   CARDIOVERSION N/A 03/27/2020   Procedure: CARDIOVERSION;  Surgeon: RSkeet Latch MD;  [place]  Service: Cardiovascular;  Laterality: N/A;   CATARACT EXTRACTION Bilateral    ESOPHAGOGASTRODUODENOSCOPY N/A 08/19/2015   Procedure: ESOPHAGOGASTRODUODENOSCOPY (EGD);  Surgeon: JLaurence Spates MD;  [facility]  Service: Endoscopy;  Laterality: N/A;   ESOPHAGOGASTRODUODENOSCOPY N/A 10/05/2021   Procedure: ESOPHAGOGASTRODUODENOSCOPY (EGD);  Surgeon: KRonnette Juniper MD;  [facility];  Service: Gastroenterology;  Laterality: N/A;   right knee replacement Right 1996   SHOULDER SURGERY     TEE WITHOUT CARDIOVERSION N/A 03/27/2020   Procedure: TRANSESOPHAGEAL ECHOCARDIOGRAM (TEE);  Surgeon: Skeet Latch, MD;  Location: [facility];  Service: Cardiovascular;  Laterality: N/A;   TONSILLECTOMY     TOTAL HIP ARTHROPLASTY Right 08/13/2019   Procedure: RIGHT TOTAL HIP ARTHROPLASTY ANTERIOR APPROACH;  Surgeon: Mcarthur Rossetti, MD;  Location: [facility];  Service: Orthopedics;  Laterality: Right;     Current Outpatient Medications  Medication Sig Dispense Refill   allopurinol (ZYLOPRIM) 100 MG tablet Take 2 tablets (200 mg total) by mouth daily.     apixaban (ELIQUIS) 5 MG TABS tablet Take 1 tablet (5 mg total) by mouth 2 (two) times daily. 180 tablet 2   atorvastatin (LIPITOR) 10 MG tablet Take 10 mg by mouth daily.   10   diltiazem (CARDIZEM CD) 240 MG 24 hr capsule TAKE (1) CAPSULE DAILY. 30 capsule 6   dofetilide (TIKOSYN) 500 MCG capsule Take 1 capsule (500 mcg total) by mouth 2 (two) times daily. 180 capsule 3   dutasteride (AVODART) 0.5 MG capsule Take 0.5 mg by mouth daily.  2   furosemide (LASIX) 40 MG tablet Take 60 mg by mouth 3 (three) times a week.     metoprolol tartrate (LOPRESSOR) 25 MG tablet Take 1 tablet (25 mg total) by mouth daily. (Patient taking differently: Take 25 mg by mouth every evening.) 90 tablet 3   mirtazapine (REMERON) 30 MG tablet TAKE ONE TABLET AT BEDTIME. 90 tablet 0   Multiple Vitamin (MULTIVITAMIN) tablet Take 1 tablet by mouth daily.     Omega-3 Fatty Acids (FISH OIL) 1200 MG CAPS Take 1,200 mg by mouth daily.     pantoprazole (PROTONIX) 40 MG tablet Take 1 tablet (40 mg total) by mouth daily.     Polyethyl Glycol-Propyl Glycol (SYSTANE) 0.4-0.3 % SOLN Place 1 drop into both eyes 2 (two) times daily.     potassium chloride SA (KLOR-CON) 20 MEQ tablet TAKE 1 TABLET BY MOUTH IN THE MORNING, TAKE 1/2 TABLET BY MOUTH IN THE P.M. 135 tablet 3   Probiotic Product (PROBIOTIC DAILY PO) Take 1 tablet by mouth daily.     pyridOXINE (VITAMIN B-6) 100 MG tablet Take 100 mg by mouth daily.     Respiratory Therapy Supplies (FLUTTER) DEVI Use the flutter valve 10 times when doing it daily. 1 each 0   spironolactone (ALDACTONE) 25 MG tablet Take 1 tablet (25 mg total) by mouth daily. (Patient taking differently: Take 25 mg by mouth every evening.) 90 tablet 2   tamsulosin (FLOMAX) 0.4 MG CAPS capsule Take 0.4 mg by mouth daily.  2   Turmeric 500 MG CAPS Take 500  mg by mouth daily.     vitamin B-12 (CYANOCOBALAMIN) 1000 MCG tablet Take 1,000 mcg by mouth daily.     No current facility-administered medications for this visit.    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   Patient drinks 1 cup of coffee per day.   Social Determinants of Health   Financial Resource Strain: Not on file  Food Insecurity: Not on file  Transportation Needs: Not on file  Physical Activity: Not on file  Stress: Not on file  Social Connections: Not on file  Intimate Partner Violence: Not on file    Family History  Problem Relation Age of Onset   Pneumonia Mother 861  Cancer - Lung Father 549  Multiple myeloma Brother 534   Review of Systems:  As stated in the HPI and otherwise negative.   BP 114/70   Pulse 74   Ht 5' 10"  (1.778 m)   Wt 212 lb 9.6 oz (96.4 kg)   SpO2 96%   BMI 30.50 kg/m   Physical Examination: General: Well developed, well nourished, NAD  HEENT: OP clear, mucus membranes moist  SKIN: warm, dry. No rashes. Neuro: No focal deficits  Musculoskeletal: Muscle strength 5/5 all ext  Psychiatric: Mood  and affect normal  Neck: No JVD, no carotid bruits, no thyromegaly, no lymphadenopathy.  Lungs:Clear bilaterally, no wheezes, rhonci, crackles Cardiovascular: Regular rate and rhythm. Loud, harsh systolic murmur.  Abdomen:Soft. Bowel sounds present. Non-tender.  Extremities: No lower extremity edema. Pulses are 2 + in the bilateral DP/PT.  Echo 05/04/22:  1. Left ventricular ejection fraction, by estimation, is 60 to 65%. The  left ventricle has normal function. The left ventricle has no regional  wall motion abnormalities. There is mild concentric left ventricular  hypertrophy. Left ventricular diastolic  parameters are consistent with Grade II diastolic dysfunction  (pseudonormalization). Elevated left ventricular end-diastolic pressure.   2. Right ventricular systolic function is normal. The right ventricular  size is normal. There is mildly elevated pulmonary artery systolic  pressure.   3. Left atrial size was severely dilated.   4. Right atrial size was mildly dilated.   5. The mitral valve is abnormal. Mild mitral valve regurgitation. Mild  mitral stenosis. There is moderate holosystolic prolapse of both leaflets  of the mitral valve. Severe mitral annular calcification.   6. The aortic valve is calcified. There is severe calcifcation of the  aortic valve. There is severe thickening of the aortic valve. Aortic valve  regurgitation is not visualized. Moderate to severe aortic valve stenosis.  Aortic valve area, by VTI  measures 1.00 cm. Aortic valve mean gradient measures 38.4 mmHg. Aortic  valve Vmax measures 3.95 m/s.   7. The inferior vena cava is normal in size with greater than 50%  respiratory variability, suggesting right atrial pressure of 3 mmHg.   8. Cannot exclude a small PFO.   Comparison(s): Changes from prior study are noted. AoV mean gradient  increased to 39 mmHg, peak velocity increased to 3.94 m/s, consistent with  moderate to severe AS.   FINDINGS   Left  Ventricle: Left ventricular ejection fraction, by estimation, is 60  to 65%.  The left ventricle has normal function. The left ventricle has no  regional wall motion abnormalities. The left ventricular internal cavity  size was normal in size. There is   mild concentric left ventricular hypertrophy. Left ventricular diastolic  parameters are consistent with Grade II diastolic dysfunction  (pseudonormalization). Elevated left ventricular end-diastolic pressure.   Right Ventricle: The right ventricular size is normal. No increase in  right ventricular wall thickness. Right ventricular systolic function is  normal. There is mildly elevated pulmonary artery systolic pressure. The  tricuspid regurgitant velocity is 3.07   m/s, and with an assumed right atrial pressure of 3 mmHg, the estimated  right ventricular systolic pressure is 89.2 mmHg.   Left Atrium: Left atrial size was severely dilated.   Right Atrium: Right atrial size was mildly dilated.   Pericardium: There is no evidence of pericardial effusion.   Mitral Valve: The mitral valve is abnormal. There is moderate holosystolic  prolapse of both leaflets of the mitral valve. Severe mitral annular  calcification. Mild mitral valve regurgitation. Mild mitral valve  stenosis. MV peak gradient, 11.2 mmHg. The  mean mitral valve gradient is 4.0 mmHg.   Tricuspid Valve: The tricuspid valve is normal in structure. Tricuspid  valve regurgitation is trivial. No evidence of tricuspid stenosis.   Aortic Valve: The aortic valve is calcified. There is severe calcifcation  of the aortic valve. There is severe thickening of the aortic valve.  Aortic valve regurgitation is not visualized. Moderate to severe aortic  stenosis is present. Aortic valve mean  gradient measures 38.4 mmHg. Aortic valve peak gradient measures 62.3  mmHg. Aortic valve area, by VTI measures 1.00 cm.   Pulmonic Valve: The pulmonic valve was not well visualized. Pulmonic  valve  regurgitation is mild to moderate. No evidence of pulmonic stenosis.   Aorta: The aortic root, ascending aorta and aortic arch are all  structurally normal, with no evidence of dilitation or obstruction.   Venous: The inferior vena cava is normal in size with greater than 50%  respiratory variability, suggesting right atrial pressure of 3 mmHg.   IAS/Shunts: Cannot exclude a small PFO.      LEFT VENTRICLE  PLAX 2D  LVIDd:         5.10 cm   Diastology  LVIDs:         2.70 cm   LV e' medial:    5.11 cm/s  LV PW:         1.20 cm   LV E/e' medial:  29.1  LV IVS:        1.20 cm   LV e' lateral:   5.66 cm/s  LVOT diam:     2.20 cm   LV E/e' lateral: 26.2  LV SV:         89  LV SV Index:   41  LVOT Area:     3.80 cm      RIGHT VENTRICLE             IVC  RV Basal diam:  3.03 cm     IVC diam: 1.30 cm  RV S prime:     18.50 cm/s  TAPSE (M-mode): 2.1 cm   LEFT ATRIUM             Index        RIGHT ATRIUM           Index  LA diam:        6.40 cm 2.94 cm/m  RA Area:     18.40 cm  LA Vol (A2C):   85.8 ml 39.46 ml/m  RA Volume:   54.60 ml  25.11 ml/m  LA Vol (A4C):   97.3 ml 44.75 ml/m  LA Biplane Vol: 93.4 ml 42.95 ml/m   AORTIC VALVE  AV Area (Vmax):    0.93 cm  AV Area (Vmean):   0.85 cm  AV Area (VTI):     1.00 cm  AV Vmax:           394.60 cm/s  AV Vmean:          288.000 cm/s  AV VTI:            0.891 m  AV Peak Grad:      62.3 mmHg  AV Mean Grad:      38.4 mmHg  LVOT Vmax:         96.75 cm/s  LVOT Vmean:        64.700 cm/s  LVOT VTI:          0.234 m  LVOT/AV VTI ratio: 0.26     AORTA  Ao Root diam: 4.00 cm  Ao Asc diam:  3.70 cm   MITRAL VALVE                TRICUSPID VALVE  MV Area (PHT): 3.95 cm     TR Peak grad:   37.7 mmHg  MV Area VTI:   1.62 cm     TR Vmax:        307.00 cm/s  MV Peak grad:  11.2 mmHg  MV Mean grad:  4.0 mmHg     SHUNTS  MV Vmax:       1.67 m/s     Systemic VTI:  0.23 m  MV Vmean:      92.3 cm/s    Systemic Diam: 2.20 cm   MV Decel Time: 192 msec  MR Peak grad: 130.8 mmHg  MR Mean grad: 88.8 mmHg  MR Vmax:      571.75 cm/s  MR Vmean:     450.3 cm/s  MV E velocity: 148.50 cm/s  MV A velocity: 118.00 cm/s  MV E/A ratio:  1.26    EKG:  EKG is ordered today. The ekg ordered today demonstrates sinus, PVCs  Recent Labs: 06/28/2021: NT-Pro BNP 246 08/19/2021: Hemoglobin 14.9; Platelets 199 12/03/2021: BUN 27; Creatinine, Ser 0.86; Magnesium 2.1; Potassium 4.8; Sodium 140   Lipid Panel    Component Value Date/Time   CHOL 96 03/06/2020 0330   TRIG 50 03/06/2020 0330   HDL 48 03/06/2020 0330   CHOLHDL 2.0 03/06/2020 0330   VLDL 10 03/06/2020 0330   LDLCALC 38 03/06/2020 0330     Wt Readings from Last 3 Encounters:  05/10/22 212 lb 9.6 oz (96.4 kg)  04/13/22 220 lb 3.2 oz (99.9 kg)  12/29/21 214 lb 3.2 oz (97.2 kg)     Other studies Reviewed: Additional studies/ records that were reviewed today include: . Review of the above records demonstrates:   Assessment and Plan:   Severe Aortic stenosis:  He has severe, stage D aortic valve stenosis. NYHA class 2.  I have personally reviewed the echo images. The aortic valve is thickened, calcified with limited leaflet mobility. I think he would benefit from AVR. Given advanced age, he is not a good candidate for conventional AVR by surgical approach. I think he may be a good candidate for TAVR.   I have reviewed the natural  history of aortic stenosis with the patient and their family members  who are present today. We have discussed the limitations of medical therapy and the poor prognosis associated with symptomatic aortic stenosis. We have reviewed potential treatment options, including palliative medical therapy, conventional surgical aortic valve replacement, and transcatheter aortic valve replacement. We discussed treatment options in the context of the patient's specific comorbid medical conditions.   He would like to proceed with planning for TAVR. I will  arrange a right and left heart catheterization at [facility] 05/26/22. Risks and benefits of the cath procedure and the valve procedure are reviewed with the patient. After the cath, he will have a cardiac CT, CTA of the chest/abdomen and pelvis and will then be referred to see one of the CT surgeons on our TAVR team.   2.  Chronic diastolic CHF: Weight is stable. No LE edema on exam. Continue Lasix.   3.  CAD without angina: Evidence of CAD by prior CTA. No angina. No ischemia on stress test in October 2022. Continue beta blocker and statin. He is not on an ASA since he is on Eliquis.   4. Atrial fib/flutter: sinus today on exam. Will continue Tikosyn, Eliquis and Cardizem.    Current medicines are reviewed at length with the patient today.  The patient does not have concerns regarding medicines.  The following changes have been made:  no change  Labs/ tests ordered today include:   Orders Placed This Encounter  Procedures   CBC   Basic metabolic panel   EKG 25-QDIY   Disposition:   F/U with me after his valve replacement    Signed, [name], MD 05/10/2022 10:50 AM    [facility] [place], [place],   [address] Phone: [phone]; Fax: [phone]

## 2022-05-09 NOTE — H&P (View-Only) (Signed)
Chief Complaint  Patient presents with   Follow-up    Aortic stenosis   History of Present Illness: 88 yo male with history of CAD, aortic stenosis, HTN, mitral valve prolapse, chronic diastolic CHF, atrial fibrillation and sleep apnea here today for cardiac follow up. He was seen in our office for evaluation of aortic stenosis and abnormal EKG in May 2018. He was having a colonoscopy on 04/04/17 and was found to have an irregular heart rhythm. He was sent to his primary care office where an EKG showed sinus rhythm with PACs. Echo November 2017 with normal LV systolic function, TMYT=11-73%, grade 1 diastolic dysfunction, functionally bicuspid aortic valve with moderate stenosis (mean gradient 21 mmHg, AVA 1.4cm2). There was mild mitral regurgitation. At his first visit here in May 2018, he described dyspnea and less energy but no chest pain or dizziness. Nuclear stress test May 2018 with no ischemia. Chest CT in February 2019 with evidence of CAD. Echo July 2020 showed normal LV systolic function, mild aortic valve stenosis (mean gradient 17 mmHg), mild AI. Atrial flutter in April 2021. He has been on Cardizem, Eliquis and Tikosyn. His atrial flutter has been followed by Dr. Rayann Heman in the EP clinic. Echo in September 2022 with LVEF=60-65%. Mild mitral regurgitation. Moderate aortic stenosis with mean gradient 26.5 mmHg, peak gradient 55.98mHg, AVA 0.95 cm2, DI 0.27, SVI 35. Nuclear stress test October 2022 without ischemia. Last echo 05/04/22 with LVEF=60-65%. Mild LVH. Normal RV function. Severe dilation left atrium. Mild MR. Mild MS. His aortic stenosis has progressed. The mean gradient is now 38.4 mmHg with peak gradient 62.3 mmHg, AVA 0.85 cm2, DI 0.26. This is now consistent with severe aortic stenosis.   He tells me today that he has had progressive dyspnea on exertion and fatigue. No chest pain. [place] He is a retired cSocial research officer, government   Primary Care Physician:  GLavone Orn MD  Past Medical History:  Diagnosis Date   Aortic stenosis    Arthritis    [place]    BPH (benign prostatic hyperplasia)    Chronic low back pain    Depression    Diverticulitis    Elevated PSA    Foot drop, right    Gait disorder 02/03/2015   Hip pain    History of colonoscopy    History of nuclear stress test    Myoview 10/22: EF 67, no ischemia or infarction; low risk   HOH (hard of hearing)    hearing aid   Hypertension    Lumbar radiculopathy    Mitral valve prolapse    Onychomycosis    Peptic ulcer disease    Sepsis ([place]   e. coli-after prostate bx   Sleep apnea    Torn ACL    Tracheobronchomalacia    seen on 2019 Chest CT    Past Surgical History:  Procedure Laterality Date   BIOPSY  10/05/2021   Procedure: BIOPSY;  Surgeon: KRonnette Juniper MD;  [facility];  Service: Gastroenterology;;   CARDIOVERSION N/A 03/27/2020   Procedure: CARDIOVERSION;  Surgeon: RSkeet Latch MD;  [place]  Service: Cardiovascular;  Laterality: N/A;   CATARACT EXTRACTION Bilateral    ESOPHAGOGASTRODUODENOSCOPY N/A 08/19/2015   Procedure: ESOPHAGOGASTRODUODENOSCOPY (EGD);  Surgeon: JLaurence Spates MD;  [facility]  Service: Endoscopy;  Laterality: N/A;   ESOPHAGOGASTRODUODENOSCOPY N/A 10/05/2021   Procedure: ESOPHAGOGASTRODUODENOSCOPY (EGD);  Surgeon: KRonnette Juniper MD;  [facility];  Service: Gastroenterology;  Laterality: N/A;   right knee replacement Right 1996   SHOULDER SURGERY     TEE WITHOUT CARDIOVERSION N/A 03/27/2020   Procedure: TRANSESOPHAGEAL ECHOCARDIOGRAM (TEE);  Surgeon: Skeet Latch, MD;  Location: [facility];  Service: Cardiovascular;  Laterality: N/A;   TONSILLECTOMY     TOTAL HIP ARTHROPLASTY Right 08/13/2019   Procedure: RIGHT TOTAL HIP ARTHROPLASTY ANTERIOR APPROACH;  Surgeon: Mcarthur Rossetti, MD;  Location: [facility];  Service: Orthopedics;  Laterality: Right;     Current Outpatient Medications  Medication Sig Dispense Refill   allopurinol (ZYLOPRIM) 100 MG tablet Take 2 tablets (200 mg total) by mouth daily.     apixaban (ELIQUIS) 5 MG TABS tablet Take 1 tablet (5 mg total) by mouth 2 (two) times daily. 180 tablet 2   atorvastatin (LIPITOR) 10 MG tablet Take 10 mg by mouth daily.   10   diltiazem (CARDIZEM CD) 240 MG 24 hr capsule TAKE (1) CAPSULE DAILY. 30 capsule 6   dofetilide (TIKOSYN) 500 MCG capsule Take 1 capsule (500 mcg total) by mouth 2 (two) times daily. 180 capsule 3   dutasteride (AVODART) 0.5 MG capsule Take 0.5 mg by mouth daily.  2   furosemide (LASIX) 40 MG tablet Take 60 mg by mouth 3 (three) times a week.     metoprolol tartrate (LOPRESSOR) 25 MG tablet Take 1 tablet (25 mg total) by mouth daily. (Patient taking differently: Take 25 mg by mouth every evening.) 90 tablet 3   mirtazapine (REMERON) 30 MG tablet TAKE ONE TABLET AT BEDTIME. 90 tablet 0   Multiple Vitamin (MULTIVITAMIN) tablet Take 1 tablet by mouth daily.     Omega-3 Fatty Acids (FISH OIL) 1200 MG CAPS Take 1,200 mg by mouth daily.     pantoprazole (PROTONIX) 40 MG tablet Take 1 tablet (40 mg total) by mouth daily.     Polyethyl Glycol-Propyl Glycol (SYSTANE) 0.4-0.3 % SOLN Place 1 drop into both eyes 2 (two) times daily.     potassium chloride SA (KLOR-CON) 20 MEQ tablet TAKE 1 TABLET BY MOUTH IN THE MORNING, TAKE 1/2 TABLET BY MOUTH IN THE P.M. 135 tablet 3   Probiotic Product (PROBIOTIC DAILY PO) Take 1 tablet by mouth daily.     pyridOXINE (VITAMIN B-6) 100 MG tablet Take 100 mg by mouth daily.     Respiratory Therapy Supplies (FLUTTER) DEVI Use the flutter valve 10 times when doing it daily. 1 each 0   spironolactone (ALDACTONE) 25 MG tablet Take 1 tablet (25 mg total) by mouth daily. (Patient taking differently: Take 25 mg by mouth every evening.) 90 tablet 2   tamsulosin (FLOMAX) 0.4 MG CAPS capsule Take 0.4 mg by mouth daily.  2   Turmeric 500 MG CAPS Take 500  mg by mouth daily.     vitamin B-12 (CYANOCOBALAMIN) 1000 MCG tablet Take 1,000 mcg by mouth daily.     No current facility-administered medications for this visit.    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   Patient drinks 1 cup of coffee per day.   Social Determinants of Health   Financial Resource Strain: Not on file  Food Insecurity: Not on file  Transportation Needs: Not on file  Physical Activity: Not on file  Stress: Not on file  Social Connections: Not on file  Intimate Partner Violence: Not on file    Family History  Problem Relation Age of Onset   Pneumonia Mother 861  Cancer - Lung Father 549  Multiple myeloma Brother 534   Review of Systems:  As stated in the HPI and otherwise negative.   BP 114/70   Pulse 74   Ht 5' 10"  (1.778 m)   Wt 212 lb 9.6 oz (96.4 kg)   SpO2 96%   BMI 30.50 kg/m   Physical Examination: General: Well developed, well nourished, NAD  HEENT: OP clear, mucus membranes moist  SKIN: warm, dry. No rashes. Neuro: No focal deficits  Musculoskeletal: Muscle strength 5/5 all ext  Psychiatric: Mood  and affect normal  Neck: No JVD, no carotid bruits, no thyromegaly, no lymphadenopathy.  Lungs:Clear bilaterally, no wheezes, rhonci, crackles Cardiovascular: Regular rate and rhythm. Loud, harsh systolic murmur.  Abdomen:Soft. Bowel sounds present. Non-tender.  Extremities: No lower extremity edema. Pulses are 2 + in the bilateral DP/PT.  Echo 05/04/22:  1. Left ventricular ejection fraction, by estimation, is 60 to 65%. The  left ventricle has normal function. The left ventricle has no regional  wall motion abnormalities. There is mild concentric left ventricular  hypertrophy. Left ventricular diastolic  parameters are consistent with Grade II diastolic dysfunction  (pseudonormalization). Elevated left ventricular end-diastolic pressure.   2. Right ventricular systolic function is normal. The right ventricular  size is normal. There is mildly elevated pulmonary artery systolic  pressure.   3. Left atrial size was severely dilated.   4. Right atrial size was mildly dilated.   5. The mitral valve is abnormal. Mild mitral valve regurgitation. Mild  mitral stenosis. There is moderate holosystolic prolapse of both leaflets  of the mitral valve. Severe mitral annular calcification.   6. The aortic valve is calcified. There is severe calcifcation of the  aortic valve. There is severe thickening of the aortic valve. Aortic valve  regurgitation is not visualized. Moderate to severe aortic valve stenosis.  Aortic valve area, by VTI  measures 1.00 cm. Aortic valve mean gradient measures 38.4 mmHg. Aortic  valve Vmax measures 3.95 m/s.   7. The inferior vena cava is normal in size with greater than 50%  respiratory variability, suggesting right atrial pressure of 3 mmHg.   8. Cannot exclude a small PFO.   Comparison(s): Changes from prior study are noted. AoV mean gradient  increased to 39 mmHg, peak velocity increased to 3.94 m/s, consistent with  moderate to severe AS.   FINDINGS   Left  Ventricle: Left ventricular ejection fraction, by estimation, is 60  to 65%.  The left ventricle has normal function. The left ventricle has no  regional wall motion abnormalities. The left ventricular internal cavity  size was normal in size. There is   mild concentric left ventricular hypertrophy. Left ventricular diastolic  parameters are consistent with Grade II diastolic dysfunction  (pseudonormalization). Elevated left ventricular end-diastolic pressure.   Right Ventricle: The right ventricular size is normal. No increase in  right ventricular wall thickness. Right ventricular systolic function is  normal. There is mildly elevated pulmonary artery systolic pressure. The  tricuspid regurgitant velocity is 3.07   m/s, and with an assumed right atrial pressure of 3 mmHg, the estimated  right ventricular systolic pressure is 89.2 mmHg.   Left Atrium: Left atrial size was severely dilated.   Right Atrium: Right atrial size was mildly dilated.   Pericardium: There is no evidence of pericardial effusion.   Mitral Valve: The mitral valve is abnormal. There is moderate holosystolic  prolapse of both leaflets of the mitral valve. Severe mitral annular  calcification. Mild mitral valve regurgitation. Mild mitral valve  stenosis. MV peak gradient, 11.2 mmHg. The  mean mitral valve gradient is 4.0 mmHg.   Tricuspid Valve: The tricuspid valve is normal in structure. Tricuspid  valve regurgitation is trivial. No evidence of tricuspid stenosis.   Aortic Valve: The aortic valve is calcified. There is severe calcifcation  of the aortic valve. There is severe thickening of the aortic valve.  Aortic valve regurgitation is not visualized. Moderate to severe aortic  stenosis is present. Aortic valve mean  gradient measures 38.4 mmHg. Aortic valve peak gradient measures 62.3  mmHg. Aortic valve area, by VTI measures 1.00 cm.   Pulmonic Valve: The pulmonic valve was not well visualized. Pulmonic  valve  regurgitation is mild to moderate. No evidence of pulmonic stenosis.   Aorta: The aortic root, ascending aorta and aortic arch are all  structurally normal, with no evidence of dilitation or obstruction.   Venous: The inferior vena cava is normal in size with greater than 50%  respiratory variability, suggesting right atrial pressure of 3 mmHg.   IAS/Shunts: Cannot exclude a small PFO.      LEFT VENTRICLE  PLAX 2D  LVIDd:         5.10 cm   Diastology  LVIDs:         2.70 cm   LV e' medial:    5.11 cm/s  LV PW:         1.20 cm   LV E/e' medial:  29.1  LV IVS:        1.20 cm   LV e' lateral:   5.66 cm/s  LVOT diam:     2.20 cm   LV E/e' lateral: 26.2  LV SV:         89  LV SV Index:   41  LVOT Area:     3.80 cm      RIGHT VENTRICLE             IVC  RV Basal diam:  3.03 cm     IVC diam: 1.30 cm  RV S prime:     18.50 cm/s  TAPSE (M-mode): 2.1 cm   LEFT ATRIUM             Index        RIGHT ATRIUM           Index  LA diam:        6.40 cm 2.94 cm/m  RA Area:     18.40 cm  LA Vol (A2C):   85.8 ml 39.46 ml/m  RA Volume:   54.60 ml  25.11 ml/m  LA Vol (A4C):   97.3 ml 44.75 ml/m  LA Biplane Vol: 93.4 ml 42.95 ml/m   AORTIC VALVE  AV Area (Vmax):    0.93 cm  AV Area (Vmean):   0.85 cm  AV Area (VTI):     1.00 cm  AV Vmax:           394.60 cm/s  AV Vmean:          288.000 cm/s  AV VTI:            0.891 m  AV Peak Grad:      62.3 mmHg  AV Mean Grad:      38.4 mmHg  LVOT Vmax:         96.75 cm/s  LVOT Vmean:        64.700 cm/s  LVOT VTI:          0.234 m  LVOT/AV VTI ratio: 0.26     AORTA  Ao Root diam: 4.00 cm  Ao Asc diam:  3.70 cm   MITRAL VALVE                TRICUSPID VALVE  MV Area (PHT): 3.95 cm     TR Peak grad:   37.7 mmHg  MV Area VTI:   1.62 cm     TR Vmax:        307.00 cm/s  MV Peak grad:  11.2 mmHg  MV Mean grad:  4.0 mmHg     SHUNTS  MV Vmax:       1.67 m/s     Systemic VTI:  0.23 m  MV Vmean:      92.3 cm/s    Systemic Diam: 2.20 cm   MV Decel Time: 192 msec  MR Peak grad: 130.8 mmHg  MR Mean grad: 88.8 mmHg  MR Vmax:      571.75 cm/s  MR Vmean:     450.3 cm/s  MV E velocity: 148.50 cm/s  MV A velocity: 118.00 cm/s  MV E/A ratio:  1.26    EKG:  EKG is ordered today. The ekg ordered today demonstrates sinus, PVCs  Recent Labs: 06/28/2021: NT-Pro BNP 246 08/19/2021: Hemoglobin 14.9; Platelets 199 12/03/2021: BUN 27; Creatinine, Ser 0.86; Magnesium 2.1; Potassium 4.8; Sodium 140   Lipid Panel    Component Value Date/Time   CHOL 96 03/06/2020 0330   TRIG 50 03/06/2020 0330   HDL 48 03/06/2020 0330   CHOLHDL 2.0 03/06/2020 0330   VLDL 10 03/06/2020 0330   LDLCALC 38 03/06/2020 0330     Wt Readings from Last 3 Encounters:  05/10/22 212 lb 9.6 oz (96.4 kg)  04/13/22 220 lb 3.2 oz (99.9 kg)  12/29/21 214 lb 3.2 oz (97.2 kg)     Other studies Reviewed: Additional studies/ records that were reviewed today include: . Review of the above records demonstrates:   Assessment and Plan:   Severe Aortic stenosis:  He has severe, stage D aortic valve stenosis. NYHA class 2.  I have personally reviewed the echo images. The aortic valve is thickened, calcified with limited leaflet mobility. I think he would benefit from AVR. Given advanced age, he is not a good candidate for conventional AVR by surgical approach. I think he may be a good candidate for TAVR.   I have reviewed the natural  history of aortic stenosis with the patient and their family members  who are present today. We have discussed the limitations of medical therapy and the poor prognosis associated with symptomatic aortic stenosis. We have reviewed potential treatment options, including palliative medical therapy, conventional surgical aortic valve replacement, and transcatheter aortic valve replacement. We discussed treatment options in the context of the patient's specific comorbid medical conditions.   He would like to proceed with planning for TAVR. I will  arrange a right and left heart catheterization at [facility] 05/26/22. Risks and benefits of the cath procedure and the valve procedure are reviewed with the patient. After the cath, he will have a cardiac CT, CTA of the chest/abdomen and pelvis and will then be referred to see one of the CT surgeons on our TAVR team.   2.  Chronic diastolic CHF: Weight is stable. No LE edema on exam. Continue Lasix.   3.  CAD without angina: Evidence of CAD by prior CTA. No angina. No ischemia on stress test in October 2022. Continue beta blocker and statin. He is not on an ASA since he is on Eliquis.   4. Atrial fib/flutter: sinus today on exam. Will continue Tikosyn, Eliquis and Cardizem.    Current medicines are reviewed at length with the patient today.  The patient does not have concerns regarding medicines.  The following changes have been made:  no change  Labs/ tests ordered today include:   Orders Placed This Encounter  Procedures   CBC   Basic metabolic panel   EKG 25-QDIY   Disposition:   F/U with me after his valve replacement    Signed, [name], MD 05/10/2022 10:50 AM    [facility] [place], [place],   [address] Phone: [phone]; Fax: [phone]

## 2022-05-10 ENCOUNTER — Encounter: Payer: Self-pay | Admitting: Cardiovascular Disease

## 2022-05-10 ENCOUNTER — Encounter: Payer: Self-pay | Admitting: *Deleted

## 2022-05-10 ENCOUNTER — Ambulatory Visit (INDEPENDENT_AMBULATORY_CARE_PROVIDER_SITE_OTHER): Payer: Medicare Other | Admitting: Cardiovascular Disease

## 2022-05-10 VITALS — BP 114/70 | HR 74 | Ht 70.0 in | Wt 212.6 lb

## 2022-05-10 DIAGNOSIS — M5416 Radiculopathy, lumbar region: Secondary | ICD-10-CM | POA: Diagnosis not present

## 2022-05-10 DIAGNOSIS — M545 Low back pain, unspecified: Secondary | ICD-10-CM | POA: Diagnosis not present

## 2022-05-10 DIAGNOSIS — I350 Nonrheumatic aortic (valve) stenosis: Secondary | ICD-10-CM

## 2022-05-10 DIAGNOSIS — M25551 Pain in right hip: Secondary | ICD-10-CM | POA: Diagnosis not present

## 2022-05-10 DIAGNOSIS — I2510 Atherosclerotic heart disease of native coronary artery without angina pectoris: Secondary | ICD-10-CM

## 2022-05-10 DIAGNOSIS — Z01812 Encounter for preprocedural laboratory examination: Secondary | ICD-10-CM | POA: Diagnosis not present

## 2022-05-10 LAB — BASIC METABOLIC PANEL
BUN/Creatinine Ratio: 30 — ABNORMAL HIGH (ref 10–24)
BUN: 32 mg/dL — ABNORMAL HIGH (ref 8–27)
CO2: 26 mmol/L (ref 20–29)
Calcium: 10.2 mg/dL (ref 8.6–10.2)
Chloride: 101 mmol/L (ref 96–106)
Creatinine, Ser: 1.06 mg/dL (ref 0.76–1.27)
Glucose: 122 mg/dL — ABNORMAL HIGH (ref 70–99)
Potassium: 5.1 mmol/L (ref 3.5–5.2)
Sodium: 139 mmol/L (ref 134–144)
eGFR: 68 mL/min/{1.73_m2} (ref >59)

## 2022-05-10 LAB — CBC
Hematocrit: 45.2 % (ref 37.5–51.0)
Hemoglobin: 15.3 g/dL (ref 13.0–17.7)
MCH: 33.3 pg — ABNORMAL HIGH (ref 26.6–33.0)
MCHC: 33.8 g/dL (ref 31.5–35.7)
MCV: 98 fL — ABNORMAL HIGH (ref 79–97)
Platelets: 206 10*3/uL (ref 150–450)
RBC: 4.6 x10E6/uL (ref 4.14–5.80)
RDW: 13.9 % (ref 11.6–15.4)
WBC: 7.4 10*3/uL (ref 3.4–10.8)

## 2022-05-10 NOTE — Progress Notes (Addendum)
Pre Surgical Assessment: 5 M Walk Test  89M=16.64f  5 Meter Walk Test- trial 1: 6.94 seconds 5 Meter Walk Test- trial 2: 5.75 seconds 5 Meter Walk Test- trial 3: 6.00 seconds 5 Meter Walk Test Average: 6.23 seconds  Procedure: Isolated AVR Risk of Mortality: 2.061% Renal Failure: 2.743% Permanent Stroke: 1.011% Prolonged Ventilation: 6.850% DSW Infection: 0.189% Reoperation: 3.729% Morbidity or Mortality: 10.921% Short Length of Stay: 23.051% Long Length of Stay: 6.564%

## 2022-05-12 DIAGNOSIS — M5416 Radiculopathy, lumbar region: Secondary | ICD-10-CM | POA: Diagnosis not present

## 2022-05-12 DIAGNOSIS — M545 Low back pain, unspecified: Secondary | ICD-10-CM | POA: Diagnosis not present

## 2022-05-12 DIAGNOSIS — M25551 Pain in right hip: Secondary | ICD-10-CM | POA: Diagnosis not present

## 2022-05-13 DIAGNOSIS — I10 Essential (primary) hypertension: Secondary | ICD-10-CM | POA: Diagnosis not present

## 2022-05-19 DIAGNOSIS — M545 Low back pain, unspecified: Secondary | ICD-10-CM | POA: Diagnosis not present

## 2022-05-19 DIAGNOSIS — M25551 Pain in right hip: Secondary | ICD-10-CM | POA: Diagnosis not present

## 2022-05-19 DIAGNOSIS — M5416 Radiculopathy, lumbar region: Secondary | ICD-10-CM | POA: Diagnosis not present

## 2022-05-23 DIAGNOSIS — M25551 Pain in right hip: Secondary | ICD-10-CM | POA: Diagnosis not present

## 2022-05-23 DIAGNOSIS — M545 Low back pain, unspecified: Secondary | ICD-10-CM | POA: Diagnosis not present

## 2022-05-23 DIAGNOSIS — M5416 Radiculopathy, lumbar region: Secondary | ICD-10-CM | POA: Diagnosis not present

## 2022-05-24 ENCOUNTER — Telehealth: Payer: Self-pay | Admitting: *Deleted

## 2022-05-24 NOTE — Telephone Encounter (Addendum)
Cardiac Catheterization scheduled at [facility] for: Thursday May 26, 2022 9 AM Arrival time and place: [facility] Main Entrance A at: 7 AM   Nothing to eat after midnight prior to procedure, clear liquids until 5 AM day of procedure.  Medication instructions: -Hold:  Eliquis-none 05/24/22 until post procedure  Lasix/KCl/Spironolactone-AM of procedure -Except hold medications usual morning medications can be taken with sips of water including aspirin 81 mg.  Confirmed patient has responsible adult to drive home post procedure and be with patient first 24 hours after arriving home.  Patient reports no new symptoms concerning for COVID-19 in the past 10 days.  Reviewed procedure instructions with patient.

## 2022-05-26 ENCOUNTER — Ambulatory Visit (HOSPITAL_COMMUNITY)
Admission: RE | Admit: 2022-05-26 | Discharge: 2022-05-26 | Disposition: A | Discharge status: Home / Self Care | Payer: Medicare Other | Attending: Cardiovascular Disease | Admitting: Cardiovascular Disease

## 2022-05-26 ENCOUNTER — Other Ambulatory Visit: Payer: Self-pay | Admitting: Cardiology

## 2022-05-26 ENCOUNTER — Encounter (HOSPITAL_COMMUNITY)
Admission: RE | Disposition: A | Discharge status: Home / Self Care | Payer: Self-pay | Source: Home / Self Care | Attending: Cardiovascular Disease

## 2022-05-26 ENCOUNTER — Encounter: Payer: Self-pay | Admitting: Cardiology

## 2022-05-26 DIAGNOSIS — Z7901 Long term (current) use of anticoagulants: Secondary | ICD-10-CM | POA: Insufficient documentation

## 2022-05-26 DIAGNOSIS — Z79899 Other long term (current) drug therapy: Secondary | ICD-10-CM | POA: Insufficient documentation

## 2022-05-26 DIAGNOSIS — I5032 Chronic diastolic (congestive) heart failure: Secondary | ICD-10-CM | POA: Diagnosis not present

## 2022-05-26 DIAGNOSIS — R0609 Other forms of dyspnea: Secondary | ICD-10-CM | POA: Diagnosis not present

## 2022-05-26 DIAGNOSIS — I4892 Unspecified atrial flutter: Secondary | ICD-10-CM | POA: Diagnosis not present

## 2022-05-26 DIAGNOSIS — I110 Hypertensive heart disease with heart failure: Secondary | ICD-10-CM | POA: Diagnosis not present

## 2022-05-26 DIAGNOSIS — I350 Nonrheumatic aortic (valve) stenosis: Secondary | ICD-10-CM

## 2022-05-26 DIAGNOSIS — I2510 Atherosclerotic heart disease of native coronary artery without angina pectoris: Secondary | ICD-10-CM | POA: Diagnosis not present

## 2022-05-26 DIAGNOSIS — I341 Nonrheumatic mitral (valve) prolapse: Secondary | ICD-10-CM | POA: Diagnosis not present

## 2022-05-26 DIAGNOSIS — I4891 Unspecified atrial fibrillation: Secondary | ICD-10-CM | POA: Diagnosis not present

## 2022-05-26 DIAGNOSIS — Z87891 Personal history of nicotine dependence: Secondary | ICD-10-CM | POA: Diagnosis not present

## 2022-05-26 DIAGNOSIS — I2584 Coronary atherosclerosis due to calcified coronary lesion: Secondary | ICD-10-CM | POA: Insufficient documentation

## 2022-05-26 HISTORY — PX: RIGHT/LEFT HEART CATH AND CORONARY ANGIOGRAPHY: CATH118266

## 2022-05-26 LAB — POCT I-STAT 7, (LYTES, BLD GAS, ICA,H+H)
Acid-base deficit: 1 mmol/L (ref 0.0–2.0)
Bicarbonate: 23 mmol/L (ref 20.0–28.0)
Calcium, Ion: 1.17 mmol/L (ref 1.15–1.40)
HCT: 41 % (ref 39.0–52.0)
Hemoglobin: 13.9 g/dL (ref 13.0–17.0)
O2 Saturation: 97 %
Potassium: 3.9 mmol/L (ref 3.5–5.1)
Sodium: 141 mmol/L (ref 135–145)
TCO2: 24 mmol/L (ref 22–32)
pCO2 arterial: 34.5 mmHg (ref 32–48)
pH, Arterial: 7.432 (ref 7.35–7.45)
pO2, Arterial: 85 mmHg (ref 83–108)

## 2022-05-26 LAB — POCT ACTIVATED CLOTTING TIME: Activated Clotting Time: 275 seconds

## 2022-05-26 SURGERY — RIGHT/LEFT HEART CATH AND CORONARY ANGIOGRAPHY
Anesthesia: LOCAL

## 2022-05-26 MED ORDER — SODIUM CHLORIDE 0.9 % IV SOLN: 250.0000 mL | INTRAVENOUS | Status: DC | PRN

## 2022-05-26 MED ORDER — CLOPIDOGREL BISULFATE 300 MG PO TABS
ORAL_TABLET | ORAL | Status: DC | PRN
  Administered 2022-05-26: 600 mg via ORAL

## 2022-05-26 MED ORDER — IOHEXOL 350 MG/ML SOLN
INTRAVENOUS | Status: DC | PRN
  Administered 2022-05-26: 120 mL

## 2022-05-26 MED ORDER — VERAPAMIL HCL 2.5 MG/ML IV SOLN
INTRAVENOUS | Status: DC | PRN
  Administered 2022-05-26: 10 mL via INTRA_ARTERIAL

## 2022-05-26 MED ORDER — VERAPAMIL HCL 2.5 MG/ML IV SOLN
INTRAVENOUS | Status: AC
  Filled 2022-05-26: qty 2

## 2022-05-26 MED ORDER — FENTANYL CITRATE (PF) 100 MCG/2ML IJ SOLN
INTRAMUSCULAR | Status: DC | PRN
  Administered 2022-05-26 (×2): 25 ug via INTRAVENOUS

## 2022-05-26 MED ORDER — LABETALOL HCL 5 MG/ML IV SOLN: 10.0000 mg | INTRAVENOUS | Status: DC | PRN

## 2022-05-26 MED ORDER — SODIUM CHLORIDE 0.9% FLUSH: 3.0000 mL | Freq: Two times a day (BID) | INTRAVENOUS | Status: DC

## 2022-05-26 MED ORDER — ASPIRIN 81 MG PO CHEW: 81.0000 mg | CHEWABLE_TABLET | ORAL | Status: DC

## 2022-05-26 MED ORDER — HEPARIN (PORCINE) IN NACL 1000-0.9 UT/500ML-% IV SOLN
INTRAVENOUS | Status: AC
  Filled 2022-05-26: qty 1000

## 2022-05-26 MED ORDER — FAMOTIDINE IN NACL 20-0.9 MG/50ML-% IV SOLN
INTRAVENOUS | Status: AC
  Filled 2022-05-26: qty 50

## 2022-05-26 MED ORDER — HEPARIN SODIUM (PORCINE) 1000 UNIT/ML IJ SOLN
INTRAMUSCULAR | Status: DC | PRN
  Administered 2022-05-26: 10000 [IU] via INTRAVENOUS
  Administered 2022-05-26: 3000 [IU] via INTRAVENOUS

## 2022-05-26 MED ORDER — MIDAZOLAM HCL 2 MG/2ML IJ SOLN
INTRAMUSCULAR | Status: DC | PRN
  Administered 2022-05-26 (×2): 1 mg via INTRAVENOUS

## 2022-05-26 MED ORDER — SODIUM CHLORIDE 0.9% FLUSH: 3.0000 mL | INTRAVENOUS | Status: DC | PRN

## 2022-05-26 MED ORDER — FENTANYL CITRATE (PF) 100 MCG/2ML IJ SOLN
INTRAMUSCULAR | Status: AC
  Filled 2022-05-26: qty 2

## 2022-05-26 MED ORDER — CLOPIDOGREL BISULFATE 300 MG PO TABS
ORAL_TABLET | ORAL | Status: AC
  Filled 2022-05-26: qty 1

## 2022-05-26 MED ORDER — HEPARIN SODIUM (PORCINE) 1000 UNIT/ML IJ SOLN
INTRAMUSCULAR | Status: AC
  Filled 2022-05-26: qty 10

## 2022-05-26 MED ORDER — FAMOTIDINE IN NACL 20-0.9 MG/50ML-% IV SOLN
INTRAVENOUS | Status: DC | PRN
  Administered 2022-05-26: 20 mg via INTRAVENOUS

## 2022-05-26 MED ORDER — ONDANSETRON HCL 4 MG/2ML IJ SOLN: 4.0000 mg | Freq: Four times a day (QID) | INTRAMUSCULAR | Status: DC | PRN

## 2022-05-26 MED ORDER — LIDOCAINE HCL (PF) 1 % IJ SOLN
INTRAMUSCULAR | Status: DC | PRN
  Administered 2022-05-26: 5 mL

## 2022-05-26 MED ORDER — SODIUM CHLORIDE 0.9 % WEIGHT BASED INFUSION: 1.0000 mL/kg/h | INTRAVENOUS | Status: DC

## 2022-05-26 MED ORDER — HEPARIN (PORCINE) IN NACL 1000-0.9 UT/500ML-% IV SOLN
INTRAVENOUS | Status: DC | PRN
  Administered 2022-05-26 (×2): 500 mL

## 2022-05-26 MED ORDER — SODIUM CHLORIDE 0.9 % IV SOLN: INTRAVENOUS | Status: AC

## 2022-05-26 MED ORDER — LIDOCAINE HCL (PF) 1 % IJ SOLN
INTRAMUSCULAR | Status: AC
  Filled 2022-05-26: qty 30

## 2022-05-26 MED ORDER — HYDRALAZINE HCL 20 MG/ML IJ SOLN: 10.0000 mg | INTRAMUSCULAR | Status: DC | PRN

## 2022-05-26 MED ORDER — ACETAMINOPHEN 325 MG PO TABS: 650.0000 mg | ORAL_TABLET | ORAL | Status: DC | PRN

## 2022-05-26 MED ORDER — SODIUM CHLORIDE 0.9 % WEIGHT BASED INFUSION
3.0000 mL/kg/h | INTRAVENOUS | Status: AC
  Administered 2022-05-26: 3 mL/kg/h via INTRAVENOUS

## 2022-05-26 MED ORDER — MIDAZOLAM HCL 2 MG/2ML IJ SOLN
INTRAMUSCULAR | Status: AC
  Filled 2022-05-26: qty 2

## 2022-05-26 SURGICAL SUPPLY — 21 items
BALLN SAPPHIRE 2.0X12 (BALLOONS) ×2
BALLOON SAPPHIRE 2.0X12 (BALLOONS) IMPLANT
BAND CMPR LRG ZPHR (HEMOSTASIS) ×1
BAND ZEPHYR COMPRESS 30 LONG (HEMOSTASIS) ×1 IMPLANT
CATH 5FR JL3.5 JR4 ANG PIG MP (CATHETERS) ×1 IMPLANT
CATH BALLN WEDGE 5F 110CM (CATHETERS) ×1 IMPLANT
CATH TELESCOPE 6F GEC (CATHETERS) ×1 IMPLANT
CATH VISTA GUIDE 6FR XBLAD3.5 (CATHETERS) ×1 IMPLANT
GLIDESHEATH SLEND SS 6F .021 (SHEATH) ×1 IMPLANT
GUIDEWIRE .025 260CM (WIRE) ×1 IMPLANT
GUIDEWIRE INQWIRE 1.5J.035X260 (WIRE) IMPLANT
INQWIRE 1.5J .035X260CM (WIRE) ×2
KIT ENCORE 26 ADVANTAGE (KITS) ×1 IMPLANT
KIT HEART LEFT (KITS) ×2 IMPLANT
PACK CARDIAC CATHETERIZATION (CUSTOM PROCEDURE TRAY) ×2 IMPLANT
SHEATH GLIDE SLENDER 4/5FR (SHEATH) ×1 IMPLANT
SYR MEDRAD MARK 7 150ML (SYRINGE) ×2 IMPLANT
TRANSDUCER W/STOPCOCK (MISCELLANEOUS) ×2 IMPLANT
TUBING CIL FLEX 10 FLL-RA (TUBING) ×2 IMPLANT
WIRE COUGAR XT STRL 190CM (WIRE) ×1 IMPLANT
WIRE HI TORQ VERSACORE-J 145CM (WIRE) ×1 IMPLANT

## 2022-05-26 NOTE — Progress Notes (Signed)
Patient and wife was given discharge instructions. Both verbalized understanding. 

## 2022-05-26 NOTE — Interval H&P Note (Signed)
History and Physical Interval Note:  05/26/2022 7:19 AM  Patrick Franco  has presented today for surgery, with the diagnosis of aortic stenosis.  The various methods of treatment have been discussed with the patient and family. After consideration of risks, benefits and other options for treatment, the patient has consented to  Procedure(s): RIGHT/LEFT HEART CATH AND CORONARY ANGIOGRAPHY (N/A) as a surgical intervention.  The patient's history has been reviewed, patient examined, no change in status, stable for surgery.  I have reviewed the patient's chart and labs.  Questions were answered to the patient's satisfaction.    Cath Lab Visit (complete for each Cath Lab visit)  Clinical Evaluation Leading to the Procedure:   ACS: No.  Non-ACS:    Anginal Classification: CCS II  Anti-ischemic medical therapy: Maximal Therapy (2 or more classes of medications)  Non-Invasive Test Results: No non-invasive testing performed  Prior CABG: No previous CABG        [name]

## 2022-05-26 NOTE — Discharge Instructions (Addendum)
Resume Eliquis tomorrow if no bleeding from right wrist cath site.    Drink plenty of fluid for 48 hours and keep wrist elevated at heart level for 24 hours  Radial Site Care   This sheet gives you information about how to care for yourself after your procedure. Your health care provider may also give you more specific instructions. If you have problems or questions, contact your health care provider. What can I expect after the procedure? After the procedure, it is common to have: Bruising and tenderness at the catheter insertion area. Follow these instructions at home: Medicines Take over-the-counter and prescription medicines only as told by your health care provider. Insertion site care Follow instructions from your health care provider about how to take care of your insertion site. Make sure you: Wash your hands with soap and water before you change your bandage (dressing). If soap and water are not available, use hand sanitizer. remove your dressing as told by your health care provider. In 24 hours Check your insertion site every day for signs of infection. Check for: Redness, swelling, or pain. Fluid or blood. Pus or a bad smell. Warmth. Do not take baths, swim, or use a hot tub until your health care provider approves. You may shower 24-48 hours after the procedure, or as directed by your health care provider. Remove the dressing and gently wash the site with plain soap and water. Pat the area dry with a clean towel. Do not rub the site. That could cause bleeding. Do not apply powder or lotion to the site. Activity   For 24 hours after the procedure, or as directed by your health care provider: Do not flex or bend the affected arm. Do not push or pull heavy objects with the affected arm. Do not drive yourself home from the hospital or clinic. You may drive 24 hours after the procedure unless your health care provider tells you not to. Do not operate machinery or power  tools. Do not lift anything that is heavier than 10 lb (4.5 kg), or the limit that you are told, until your health care provider says that it is safe. For 4 days Ask your health care provider when it is okay to: Return to work or school. Resume usual physical activities or sports. Resume sexual activity. General instructions If the catheter site starts to bleed, raise your arm and put firm pressure on the site. If the bleeding does not stop, get help right away. This is a medical emergency. If you went home on the same day as your procedure, a responsible adult should be with you for the first 24 hours after you arrive home. Keep all follow-up visits as told by your health care provider. This is important. Contact a health care provider if: You have a fever. You have redness, swelling, or yellow drainage around your insertion site. Get help right away if: You have unusual pain at the radial site. The catheter insertion area swells very fast. The insertion area is bleeding, and the bleeding does not stop when you hold steady pressure on the area. Your arm or hand becomes pale, cool, tingly, or numb. These symptoms may represent a serious problem that is an emergency. Do not wait to see if the symptoms will go away. Get medical help right away. Call your local emergency services (911 in the [place] Do not drive yourself to the hospital. Summary After the procedure, it is common to have bruising and tenderness at the site. Follow  instructions from your health care provider about how to take care of your radial site wound. Check the wound every day for signs of infection. Do not lift anything that is heavier than 10 lb (4.5 kg), or the limit that you are told, until your health care provider says that it is safe. This information is not intended to replace advice given to you by your health care provider. Make sure you discuss any questions you have with your health care provider. Document  Revised: 12/06/2017 Document Reviewed: 12/06/2017 Elsevier Patient Education  2020 [employer].

## 2022-05-26 NOTE — Progress Notes (Signed)
CARDIAC REHAB PHASE I   Pt not appropriate for cardiac rehab, no intervention completed at this time.   Rufina Falco, RN BSN 05/26/2022 11:37 AM

## 2022-05-27 ENCOUNTER — Encounter (HOSPITAL_COMMUNITY): Payer: Self-pay | Admitting: Cardiovascular Disease

## 2022-06-01 DIAGNOSIS — M25551 Pain in right hip: Secondary | ICD-10-CM | POA: Diagnosis not present

## 2022-06-01 DIAGNOSIS — M545 Low back pain, unspecified: Secondary | ICD-10-CM | POA: Diagnosis not present

## 2022-06-01 DIAGNOSIS — M5416 Radiculopathy, lumbar region: Secondary | ICD-10-CM | POA: Diagnosis not present

## 2022-06-06 DIAGNOSIS — M545 Low back pain, unspecified: Secondary | ICD-10-CM | POA: Diagnosis not present

## 2022-06-06 DIAGNOSIS — M25551 Pain in right hip: Secondary | ICD-10-CM | POA: Diagnosis not present

## 2022-06-06 DIAGNOSIS — M5416 Radiculopathy, lumbar region: Secondary | ICD-10-CM | POA: Diagnosis not present

## 2022-06-07 ENCOUNTER — Ambulatory Visit (HOSPITAL_COMMUNITY)
Admission: RE | Admit: 2022-06-07 | Discharge: 2022-06-07 | Disposition: A | Discharge status: Home / Self Care | Payer: Medicare Other | Source: Ambulatory Visit | Attending: Cardiology | Admitting: Cardiology

## 2022-06-07 DIAGNOSIS — Z01818 Encounter for other preprocedural examination: Secondary | ICD-10-CM | POA: Diagnosis not present

## 2022-06-07 DIAGNOSIS — I358 Other nonrheumatic aortic valve disorders: Secondary | ICD-10-CM | POA: Diagnosis not present

## 2022-06-07 DIAGNOSIS — M4186 Other forms of scoliosis, lumbar region: Secondary | ICD-10-CM | POA: Diagnosis not present

## 2022-06-07 DIAGNOSIS — K429 Umbilical hernia without obstruction or gangrene: Secondary | ICD-10-CM | POA: Diagnosis not present

## 2022-06-07 DIAGNOSIS — K5730 Diverticulosis of large intestine without perforation or abscess without bleeding: Secondary | ICD-10-CM | POA: Diagnosis not present

## 2022-06-07 DIAGNOSIS — I350 Nonrheumatic aortic (valve) stenosis: Secondary | ICD-10-CM | POA: Insufficient documentation

## 2022-06-07 DIAGNOSIS — I771 Stricture of artery: Secondary | ICD-10-CM | POA: Diagnosis not present

## 2022-06-07 MED ORDER — IOHEXOL 350 MG/ML SOLN
100.0000 mL | Freq: Once | INTRAVENOUS | Status: AC | PRN
  Administered 2022-06-07: 100 mL via INTRAVENOUS

## 2022-06-13 DIAGNOSIS — M545 Low back pain, unspecified: Secondary | ICD-10-CM | POA: Diagnosis not present

## 2022-06-13 DIAGNOSIS — M5416 Radiculopathy, lumbar region: Secondary | ICD-10-CM | POA: Diagnosis not present

## 2022-06-13 DIAGNOSIS — M25551 Pain in right hip: Secondary | ICD-10-CM | POA: Diagnosis not present

## 2022-06-20 DIAGNOSIS — M5416 Radiculopathy, lumbar region: Secondary | ICD-10-CM | POA: Diagnosis not present

## 2022-06-20 DIAGNOSIS — M545 Low back pain, unspecified: Secondary | ICD-10-CM | POA: Diagnosis not present

## 2022-06-20 DIAGNOSIS — M25551 Pain in right hip: Secondary | ICD-10-CM | POA: Diagnosis not present

## 2022-06-27 DIAGNOSIS — M545 Low back pain, unspecified: Secondary | ICD-10-CM | POA: Diagnosis not present

## 2022-06-27 DIAGNOSIS — M25551 Pain in right hip: Secondary | ICD-10-CM | POA: Diagnosis not present

## 2022-06-27 DIAGNOSIS — M5416 Radiculopathy, lumbar region: Secondary | ICD-10-CM | POA: Diagnosis not present

## 2022-07-07 ENCOUNTER — Other Ambulatory Visit: Payer: Self-pay | Admitting: Internal Medicine

## 2022-07-07 NOTE — Telephone Encounter (Signed)
This is a A-Fib clinic pt 

## 2022-07-11 DIAGNOSIS — M545 Low back pain, unspecified: Secondary | ICD-10-CM | POA: Diagnosis not present

## 2022-07-11 DIAGNOSIS — M5416 Radiculopathy, lumbar region: Secondary | ICD-10-CM | POA: Diagnosis not present

## 2022-07-11 DIAGNOSIS — M25551 Pain in right hip: Secondary | ICD-10-CM | POA: Diagnosis not present

## 2022-07-12 ENCOUNTER — Ambulatory Visit (HOSPITAL_COMMUNITY)
Admission: RE | Admit: 2022-07-12 | Discharge: 2022-07-12 | Disposition: A | Discharge status: Home / Self Care | Payer: Medicare Other | Source: Ambulatory Visit | Attending: Nurse Practitioner | Admitting: Nurse Practitioner

## 2022-07-12 ENCOUNTER — Encounter (HOSPITAL_COMMUNITY): Payer: Self-pay | Admitting: Nurse Practitioner

## 2022-07-12 VITALS — BP 104/56 | HR 64 | Ht 70.0 in | Wt 213.6 lb

## 2022-07-12 DIAGNOSIS — I2510 Atherosclerotic heart disease of native coronary artery without angina pectoris: Secondary | ICD-10-CM | POA: Insufficient documentation

## 2022-07-12 DIAGNOSIS — I110 Hypertensive heart disease with heart failure: Secondary | ICD-10-CM | POA: Diagnosis not present

## 2022-07-12 DIAGNOSIS — I509 Heart failure, unspecified: Secondary | ICD-10-CM | POA: Insufficient documentation

## 2022-07-12 DIAGNOSIS — I350 Nonrheumatic aortic (valve) stenosis: Secondary | ICD-10-CM | POA: Insufficient documentation

## 2022-07-12 DIAGNOSIS — I484 Atypical atrial flutter: Secondary | ICD-10-CM | POA: Diagnosis not present

## 2022-07-12 DIAGNOSIS — I4819 Other persistent atrial fibrillation: Secondary | ICD-10-CM | POA: Diagnosis not present

## 2022-07-12 DIAGNOSIS — I493 Ventricular premature depolarization: Secondary | ICD-10-CM

## 2022-07-12 DIAGNOSIS — D6869 Other thrombophilia: Secondary | ICD-10-CM | POA: Diagnosis not present

## 2022-07-12 DIAGNOSIS — I4891 Unspecified atrial fibrillation: Secondary | ICD-10-CM | POA: Insufficient documentation

## 2022-07-12 DIAGNOSIS — Z7901 Long term (current) use of anticoagulants: Secondary | ICD-10-CM | POA: Insufficient documentation

## 2022-07-12 LAB — BASIC METABOLIC PANEL
Anion gap: 6 (ref 5–15)
BUN: 30 mg/dL — ABNORMAL HIGH (ref 8–23)
CO2: 25 mmol/L (ref 22–32)
Calcium: 9.1 mg/dL (ref 8.9–10.3)
Chloride: 108 mmol/L (ref 98–111)
Creatinine, Ser: 1.15 mg/dL (ref 0.61–1.24)
GFR, Estimated: >60 mL/min (ref >60)
Glucose, Bld: 127 mg/dL — ABNORMAL HIGH (ref 70–99)
Potassium: 5.1 mmol/L (ref 3.5–5.1)
Sodium: 139 mmol/L (ref 135–145)

## 2022-07-12 LAB — MAGNESIUM: Magnesium: 2.2 mg/dL (ref 1.7–2.4)

## 2022-07-12 MED ORDER — SPIRONOLACTONE 25 MG PO TABS: 12.5000 mg | ORAL_TABLET | Freq: Every day | ORAL | 2 refills | Status: DC

## 2022-07-12 MED ORDER — METOPROLOL TARTRATE 25 MG PO TABS: 25.0000 mg | ORAL_TABLET | Freq: Every day | ORAL | 3 refills | Status: DC

## 2022-07-12 MED ORDER — METOPROLOL TARTRATE 25 MG PO TABS: 12.5000 mg | ORAL_TABLET | Freq: Every day | ORAL | 3 refills | Status: DC

## 2022-07-12 NOTE — Patient Instructions (Addendum)
Decrease spironolactone to 1/2 tablet daily

## 2022-07-12 NOTE — Progress Notes (Signed)
Primary Care Physician: Lavone Orn, MD Referring Physician: Richardson Dopp, PA Cardiologist/EP: Dr. Fara Boros Patrick Franco is a 88 y.o. male with a h/o HTN, previous gastric ulcer with hemorrhage, moderate aortic stenosis, h/o CHF, CAD, afib on Tikosyn. He is in the afib clinic as he had a spell of dyspnea at bedtime last night and called EMS. He had just went to bed and was putting  on his CPAP and felt winded. He sat up and tried CPAP again and became short of breath again told his wife to call EMS. The fire department was there in a few minutes when he was still feeling short of breath and placed on monitor which showed SR. EMS arrived, EKG was done and it showed SR as well. His V/S were normal. It was not advised for him to go to the ER. He immediately felt better.  He  states that he may have panicked a little last night.  Episode lasted 25-30 mins. He also states that he has not felt well for the last few months. His PCP called this am to tell him that his HGB has dropped to 10.3 from 14.9 10/22 and is trying to get GI appointment asap. He has noted black stools.  He had a stress test 10/6 which was low risk and an echo in September did show progression of aortic stenosis from mild to moderate.   He is in SR today with stable BP. His weight today is 208 lbs and it was 218 lbs in September.    F/u in afib clinic, 12/29/21. He is staying in [place] with stable qt. He did have a GI w/u last fall and was found to have a gastric ulcer. He was placed on a PPI, and oral iron and now CBC has returned to normal. He has not had any afib. No further dyspnea spells as described above.   F/u in afib clinic, 07/12/22. He is getting ready to have Aortic stenosis addressed, pending TAVR,  he is seeing Dr. Cyndia Bent 9/13. He is in SR with bigeminal PVC's today. Tikosyn labs are pending. He takes his metoprolol and his spironolactone at noon and feels wiped out after that. His BP is soft today at 104/56. He has had PVC's  in the past but I have not seen bigeminal PVC's. Qt stable at 480 ms. He is unaware.    Today, he denies symptoms of palpitations, chest pain, shortness of breath, orthopnea, PND, lower extremity edema, dizziness, presyncope, syncope, or neurologic sequela. The patient is tolerating medications without difficulties and is otherwise without complaint today.   Past Medical History:  Diagnosis Date   Aortic stenosis    Arthritis    Atrial fibrillation (HCC)    BPH (benign prostatic hyperplasia)    Chronic low back pain    Depression    Diverticulitis    Elevated PSA    Foot drop, right    Gait disorder 02/03/2015   Hip pain    History of colonoscopy    History of nuclear stress test    Myoview 10/22: EF 67, no ischemia or infarction; low risk   HOH (hard of hearing)    hearing aid   Hypertension    Lumbar radiculopathy    Mitral valve prolapse    Onychomycosis    Peptic ulcer disease    Sepsis ([place]) 2005   e. coli-after prostate bx   Sleep apnea    Torn ACL    Tracheobronchomalacia  seen on 2019 Chest CT   Past Surgical History:  Procedure Laterality Date   BIOPSY  10/05/2021   Procedure: BIOPSY;  Surgeon: Ronnette Juniper, MD;  Location: [facility];  Service: Gastroenterology;;   CARDIOVERSION N/A 03/27/2020   Procedure: CARDIOVERSION;  Surgeon: Skeet Latch, MD;  Location: [facility] ENDOSCOPY;  Service: Cardiovascular;  Laterality: N/A;   CATARACT EXTRACTION Bilateral    ESOPHAGOGASTRODUODENOSCOPY N/A 08/19/2015   Procedure: ESOPHAGOGASTRODUODENOSCOPY (EGD);  Surgeon: Laurence Spates, MD;  Location: [facility] ENDOSCOPY;  Service: Endoscopy;  Laterality: N/A;   ESOPHAGOGASTRODUODENOSCOPY N/A 10/05/2021   Procedure: ESOPHAGOGASTRODUODENOSCOPY (EGD);  Surgeon: Ronnette Juniper, MD;  Location: [facility];  Service: Gastroenterology;  Laterality: N/A;   right knee replacement Right 1996   RIGHT/LEFT HEART CATH AND CORONARY ANGIOGRAPHY N/A 05/26/2022   Procedure: RIGHT/LEFT HEART CATH AND  CORONARY ANGIOGRAPHY;  Surgeon: Burnell Blanks, MD;  Location: [facility];  Service: Cardiovascular;  Laterality: N/A;   SHOULDER SURGERY     TEE WITHOUT CARDIOVERSION N/A 03/27/2020   Procedure: TRANSESOPHAGEAL ECHOCARDIOGRAM (TEE);  Surgeon: Skeet Latch, MD;  Location: [facility];  Service: Cardiovascular;  Laterality: N/A;   TONSILLECTOMY     TOTAL HIP ARTHROPLASTY Right 08/13/2019   Procedure: RIGHT TOTAL HIP ARTHROPLASTY ANTERIOR APPROACH;  Surgeon: Mcarthur Rossetti, MD;  Location: [facility];  Service: Orthopedics;  Laterality: Right;    Current Outpatient Medications  Medication Sig Dispense Refill   allopurinol (ZYLOPRIM) 100 MG tablet Take 2 tablets (200 mg total) by mouth daily.     apixaban (ELIQUIS) 5 MG TABS tablet Take 1 tablet (5 mg total) by mouth 2 (two) times daily. 180 tablet 2   atorvastatin (LIPITOR) 10 MG tablet Take 10 mg by mouth every morning.  10   diltiazem (CARDIZEM CD) 240 MG 24 hr capsule TAKE (1) CAPSULE DAILY. 30 capsule 6   dofetilide (TIKOSYN) 500 MCG capsule TAKE ONE CAPSULE BY MOUTH TWICE DAILY 180 capsule 1   dutasteride (AVODART) 0.5 MG capsule Take 0.5 mg by mouth every morning.  2   furosemide (LASIX) 40 MG tablet Take 60 mg by mouth 3 (three) times a week.     mirtazapine (REMERON) 30 MG tablet TAKE ONE TABLET AT BEDTIME. 90 tablet 0   Multiple Vitamin (MULTIVITAMIN) tablet Take 1 tablet by mouth daily.     Omega-3 Fatty Acids (FISH OIL) 1200 MG CAPS Take 1,200 mg by mouth daily.     pantoprazole (PROTONIX) 40 MG tablet Take 1 tablet (40 mg total) by mouth daily.     Polyethyl Glycol-Propyl Glycol (SYSTANE) 0.4-0.3 % SOLN Place 1 drop into both eyes 2 (two) times daily.     potassium chloride SA (KLOR-CON) 20 MEQ tablet TAKE 1 TABLET BY MOUTH IN THE MORNING, TAKE 1/2 TABLET BY MOUTH IN THE P.M. 135 tablet 3   Probiotic Product (PROBIOTIC DAILY PO) Take 1 tablet by mouth daily.     pyridOXINE (VITAMIN B-6) 100 MG tablet Take  100 mg by mouth daily.     Respiratory Therapy Supplies (FLUTTER) DEVI Use the flutter valve 10 times when doing it daily. 1 each 0   tamsulosin (FLOMAX) 0.4 MG CAPS capsule Take 0.4 mg by mouth daily after breakfast.  2   Turmeric 500 MG CAPS Take 500 mg by mouth daily.     vitamin B-12 (CYANOCOBALAMIN) 1000 MCG tablet Take 1,000 mcg by mouth daily.     metoprolol tartrate (LOPRESSOR) 25 MG tablet Take 1 tablet (25 mg total) by mouth daily. [place]  tablet 3   spironolactone (ALDACTONE) 25 MG tablet Take 0.5 tablets (12.5 mg total) by mouth daily. 90 tablet 2   No current facility-administered medications for this encounter.    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   Patient drinks 1 cup of coffee per day.   Social Determinants of Health   Financial Resource Strain: Not on file  Food Insecurity: Not on file  Transportation Needs: Not on file  Physical Activity: Not on file  Stress: Not on file  Social Connections: Not on file  Intimate Partner Violence: Not on file    Family History  Problem Relation Age of Onset   Pneumonia Mother 826  Cancer  - Lung Father 596  Multiple myeloma Brother 517   ROS- All systems are reviewed and negative except as per the HPI above  Physical Exam: Vitals:   07/12/22 1013  BP: (!) 104/56  Pulse: 64  Weight: 96.9 kg  Height: _0  (1.778 m)   Wt Readings from Last 3 Encounters:  07/12/22 96.9 kg  05/26/22 93 kg  05/10/22 96.4 kg    Labs: Lab Results  Component Value Date   NA 141 05/26/2022   K 3.9 05/26/2022   CL 101 05/10/2022   CO2 26 05/10/2022   GLUCOSE 122 (H) 05/10/2022   BUN 32 (H) 05/10/2022   CREATININE 1.06 05/10/2022   CALCIUM 10.2 05/10/2022   MG 2.1 12/03/2021   No results found for: "INR" Lab Results  Component Value Date   CHOL 96 03/06/2020   HDL 48 03/06/2020   LDLCALC 38 03/06/2020   TRIG 50 03/06/2020     GEN- The patient is well appearing, alert and oriented x 3 today.   Head- normocephalic, atraumatic Eyes-  Sclera clear, conjunctiva pink Ears- hearing intact Oropharynx- clear Neck- supple, no JVP Lymph- no cervical lymphadenopathy Lungs- Clear to ausculation bilaterally, normal work of breathing Heart- Regular rate and rhythm, no murmurs, rubs or gallops, PMI not laterally displaced GI- soft, NT, ND, + BS Extremities- no clubbing, cyanosis, or edema MS- no significant deformity or atrophy Skin- no rash or lesion Psych- euthymic mood, full affect Neuro- strength and sensation are intact  EKG-  Vent. rate 60 BPM PR interval 276 ms QRS duration 86 ms QT/QTcB 494/494 ms P-R-T axes 65 28 16 Sinus rhythm with 1st degree A-V block with frequent Premature ventricular complexes in a pattern  of bigeminy Anterior infarct , age undetermined Abnormal ECG When compared with ECG of 12-Jul-2022 10:22, PREVIOUS ECG IS PRESENT  Epic records reviewed    Assessment and Plan:  1.Afib Maintaining SR with Tikosyn Continue 500 mcg bid  Qt stable, bmet/mag pending  Bigeminal PVC's today  2. CHA2DS2VASc  score of at least 5 Continue  apixaban 5 mg  bid   3. Aortic stenosis  Pending TAVR Per  Dr. Angelena Form    4. Soft BP States feels wiped out with metoprolol and spironolactone when he takes at noon Continue metoprolol at current dose for now with PVC's but try to reduce spironolactone to 1/2 tab a day  I will see back in one week with ekg, bp check    Butch Penny C. , [facility] [address] [place], [place] [address] [phone]

## 2022-07-15 ENCOUNTER — Other Ambulatory Visit: Payer: Self-pay | Admitting: Cardiovascular Disease

## 2022-07-20 ENCOUNTER — Institutional Professional Consult (permissible substitution) (INDEPENDENT_AMBULATORY_CARE_PROVIDER_SITE_OTHER): Payer: Medicare Other | Admitting: Surgery

## 2022-07-20 ENCOUNTER — Other Ambulatory Visit (HOSPITAL_COMMUNITY): Payer: Medicare Other

## 2022-07-20 ENCOUNTER — Encounter: Payer: Self-pay | Admitting: Surgery

## 2022-07-20 ENCOUNTER — Other Ambulatory Visit: Payer: Self-pay

## 2022-07-20 ENCOUNTER — Ambulatory Visit (HOSPITAL_COMMUNITY)
Admission: RE | Admit: 2022-07-20 | Discharge: 2022-07-20 | Disposition: A | Discharge status: Home / Self Care | Payer: Medicare Other | Source: Ambulatory Visit | Attending: Nurse Practitioner | Admitting: Nurse Practitioner

## 2022-07-20 VITALS — BP 104/62 | HR 61 | Ht 70.0 in

## 2022-07-20 VITALS — BP 112/69 | HR 61 | Resp 18 | Ht 70.0 in | Wt 213.0 lb

## 2022-07-20 DIAGNOSIS — I484 Atypical atrial flutter: Secondary | ICD-10-CM | POA: Diagnosis not present

## 2022-07-20 DIAGNOSIS — I2510 Atherosclerotic heart disease of native coronary artery without angina pectoris: Secondary | ICD-10-CM

## 2022-07-20 DIAGNOSIS — I10 Essential (primary) hypertension: Secondary | ICD-10-CM | POA: Diagnosis not present

## 2022-07-20 DIAGNOSIS — I493 Ventricular premature depolarization: Secondary | ICD-10-CM | POA: Diagnosis not present

## 2022-07-20 DIAGNOSIS — I350 Nonrheumatic aortic (valve) stenosis: Secondary | ICD-10-CM

## 2022-07-20 DIAGNOSIS — I480 Paroxysmal atrial fibrillation: Secondary | ICD-10-CM | POA: Diagnosis not present

## 2022-07-20 DIAGNOSIS — N401 Enlarged prostate with lower urinary tract symptoms: Secondary | ICD-10-CM | POA: Diagnosis not present

## 2022-07-20 DIAGNOSIS — D509 Iron deficiency anemia, unspecified: Secondary | ICD-10-CM | POA: Diagnosis not present

## 2022-07-20 DIAGNOSIS — K219 Gastro-esophageal reflux disease without esophagitis: Secondary | ICD-10-CM | POA: Diagnosis not present

## 2022-07-20 DIAGNOSIS — Z79899 Other long term (current) drug therapy: Secondary | ICD-10-CM | POA: Insufficient documentation

## 2022-07-20 DIAGNOSIS — I5030 Unspecified diastolic (congestive) heart failure: Secondary | ICD-10-CM | POA: Diagnosis not present

## 2022-07-20 DIAGNOSIS — F329 Major depressive disorder, single episode, unspecified: Secondary | ICD-10-CM | POA: Diagnosis not present

## 2022-07-20 NOTE — Progress Notes (Signed)
In for EKG with ekg last week showing bigeminal PVC's with QTC of 494 ms. His  ekg today looks improved with a rare PVC with qtc of 428 ms. He feels slightly better with spironolactone being cut in half. He is pending TAVR 9/12 with Dr. Julianne Handler. I will see back in 6 months for Tikosyn surveillance.

## 2022-07-20 NOTE — Progress Notes (Signed)
Patient ID: Patrick Franco, male   DOB: [birth year], 88 y.o.   MRN: [id number]  [facility]       [address]       [place] [address]             [phone]          CARDIOTHORACIC SURGERY CONSULTATION REPORT  PCP is Lavone Orn, MD Referring Provider is [name], MD Primary Cardiologist is [name], MD  Reason for consultation: Severe aortic stenosis  HPI:  The patient is an 88 year old gentleman with history of hypertension, atrial fibrillation on Eliquis and Tikosyn, mitral valve prolapse, chronic diastolic congestive heart failure, OSA on CPAP, coronary artery disease, and aortic stenosis who was referred for consideration of transcatheter aortic valve replacement.  A 2D echo in September 2022 showed a mean gradient of 26.5 mmHg with a valve area by VTI of 0.95 cm.  The valve was severely calcified and thickened.  His most recent echo on 05/04/2022 shows an increase in the mean gradient to 38.4 mmHg with a valve area by VTI of 1 cm.  V-max is 3.95 m/s.  Left ventricular ejection fraction is 60 to 65% with grade 2 diastolic dysfunction.  He underwent a right and left heart cath on 05/26/2022 which showed a 99% stenosis in the mid to distal LAD and a heavily calcified area.  Left circumflex had 40% mid vessel stenosis.  The right coronary artery was a large dominant vessel with mild plaque in the proximal and mid vessel.  The tight LAD lesion could not be crossed at the time of catheterization.  The patient is here today with his wife.  He reports progressive exertional fatigue and shortness of breath with low-level activity.  His wife reports that he sounds out of breath walking to the bathroom at night.  He has significantly decreased his activity level.  He denies any chest pain or pressure.  He has had some dizziness but no syncope.  He denies PND and orthopnea.  He has had mild lower  extremity edema improved since being on diuretics.  Past Medical History:  Diagnosis Date   Aortic stenosis    Arthritis    Atrial fibrillation (HCC)    BPH (benign prostatic hyperplasia)    Chronic low back pain    Depression    Diverticulitis    Elevated PSA    Foot drop, right    Gait disorder 02/03/2015   Hip pain    History of colonoscopy    History of nuclear stress test    Myoview 10/22: EF 67, no ischemia or infarction; low risk   HOH (hard of hearing)    hearing aid   Hypertension    Lumbar radiculopathy    Mitral valve prolapse    Onychomycosis    Peptic ulcer disease    Sepsis ([place]) 2005   e. coli-after prostate bx   Sleep apnea    Torn ACL    Tracheobronchomalacia    seen on 2019 Chest CT    Past Surgical History:  Procedure Laterality Date   BIOPSY  10/05/2021   Procedure: BIOPSY;  Surgeon: Ronnette Juniper, MD;  Location: [facility];  Service: Gastroenterology;;   CARDIOVERSION N/A 03/27/2020   Procedure: CARDIOVERSION;  Surgeon: Skeet Latch, MD;  Location: [facility];  Service: Cardiovascular;  Laterality: N/A;   CATARACT EXTRACTION Bilateral    ESOPHAGOGASTRODUODENOSCOPY N/A 08/19/2015   Procedure: ESOPHAGOGASTRODUODENOSCOPY (EGD);  Surgeon: Laurence Spates, MD;  Location: [facility];  Service: Endoscopy;  Laterality: N/A;   ESOPHAGOGASTRODUODENOSCOPY N/A 10/05/2021   Procedure: ESOPHAGOGASTRODUODENOSCOPY (EGD);  Surgeon: Ronnette Juniper, MD;  Location: [facility];  Service: Gastroenterology;  Laterality: N/A;   right knee replacement Right 1996   RIGHT/LEFT HEART CATH AND CORONARY ANGIOGRAPHY N/A 05/26/2022   Procedure: RIGHT/LEFT HEART CATH AND CORONARY ANGIOGRAPHY;  Surgeon: Burnell Blanks, MD;  Location: [facility];  Service: Cardiovascular;  Laterality: N/A;   SHOULDER SURGERY     TEE WITHOUT CARDIOVERSION N/A 03/27/2020   Procedure: TRANSESOPHAGEAL ECHOCARDIOGRAM (TEE);  Surgeon: Skeet Latch, MD;  Location: [facility];   Service: Cardiovascular;  Laterality: N/A;   TONSILLECTOMY     TOTAL HIP ARTHROPLASTY Right 08/13/2019   Procedure: RIGHT TOTAL HIP ARTHROPLASTY ANTERIOR APPROACH;  Surgeon: Mcarthur Rossetti, MD;  Location: [facility];  Service: Orthopedics;  Laterality: Right;    Family History  Problem Relation Age of Onset   Pneumonia Mother 27   Cancer - Lung Father 87   Multiple myeloma Brother 48    Social History   Socioeconomic History   Marital status: Married    Spouse name: Not on file   Number of children: 2   Years of education: Not on file   Highest education level: Not on file  Occupational History   Occupation: Retired Social research officer, government  Tobacco Use   Smoking status: Former    Packs/day: 0.50    Types: Cigarettes    Start date: 1960    Quit date: 1961    Years since quitting: 62.7   Smokeless tobacco: Never   Tobacco comments:    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   Patient drinks 1 cup of coffee per day.   Social Determinants of Health   Financial Resource Strain: Not on file  Food Insecurity: Not on file  Transportation Needs: Not on file  Physical Activity: Not on file  Stress: Not on file  Social Connections: Not on file  Intimate Partner Violence: Not on file    Prior to Admission medications   Medication Sig Start Date End Date Taking? Authorizing Provider  allopurinol (ZYLOPRIM) 100 MG tablet Take 2 tablets (200 mg total) by mouth daily. 12/29/21  Yes CSherran Needs NP  apixaban (ELIQUIS) 5 MG TABS tablet Take 1 tablet (5 mg total) by mouth 2 (two) times daily. 05/03/22  Yes MBurnell Blanks MD  atorvastatin (LIPITOR) 10 MG tablet Take 10 mg by mouth every morning. 02/19/18  Yes [provider]  diltiazem (CARDIZEM CD) 240 MG 24 hr capsule TAKE (1) CAPSULE DAILY. 07/15/22  Yes MBurnell Blanks MD  dofetilide (TIKOSYN) 500 MCG capsule TAKE ONE CAPSULE BY MOUTH TWICE DAILY 07/07/22  Yes CSherran Needs NP  dutasteride (AVODART) 0.5 MG capsule Take 0.5 mg by mouth every morning. 01/06/15  Yes [provider]  furosemide (LASIX) 40 MG tablet Take 60 mg by mouth 4 (four) times a week.   Yes [provider]  metoprolol tartrate (LOPRESSOR) 25 MG tablet Take 1 tablet (25 mg total) by mouth daily. 07/12/22  Yes Sherran Needs, NP  mirtazapine (REMERON) 30 MG tablet TAKE ONE TABLET AT BEDTIME. 08/03/20  Yes Lyndal Pulley, DO  Multiple Vitamin (MULTIVITAMIN) tablet Take 1 tablet by mouth daily.   Yes [provider]  Omega-3 Fatty Acids (FISH OIL) 1200 MG CAPS Take 1,200 mg by mouth daily.   Yes [provider]  pantoprazole (PROTONIX) 40 MG tablet Take 1 tablet (40 mg total) by mouth daily. 12/29/21 12/29/22 Yes Sherran Needs, NP  Polyethyl Glycol-Propyl Glycol (SYSTANE) 0.4-0.3 % SOLN Place 1 drop into both eyes 2 (two) times daily.   Yes [provider]  potassium chloride SA (KLOR-CON) 20 MEQ tablet TAKE 1 TABLET BY MOUTH IN THE MORNING, TAKE 1/2 TABLET BY MOUTH IN THE P.M. 08/10/21  Yes Weaver, [name] T, PA-C  Probiotic Product (PROBIOTIC DAILY PO) Take 1 tablet by mouth daily.   Yes [provider]  pyridOXINE (VITAMIN B-6) 100 MG tablet Take 100 mg by mouth daily.   Yes [provider]  Respiratory Therapy Supplies (FLUTTER) DEVI Use the flutter valve 10 times when doing it daily. 01/25/18  Yes Brand Males, MD  spironolactone (ALDACTONE) 25 MG tablet Take 0.5 tablets (12.5 mg total) by mouth daily. 07/12/22  Yes Sherran Needs, NP  tamsulosin (FLOMAX) 0.4 MG CAPS capsule Take 0.4 mg by mouth  daily after breakfast. 01/06/15  Yes [provider]  Turmeric 500 MG CAPS Take 500 mg by mouth daily.   Yes [provider]  vitamin B-12 (CYANOCOBALAMIN) 1000 MCG tablet Take 1,000 mcg by mouth daily.    [provider]    Current Outpatient Medications  Medication Sig Dispense Refill   allopurinol (ZYLOPRIM) 100 MG tablet Take 2 tablets (200 mg total) by mouth daily.     apixaban (ELIQUIS) 5 MG TABS tablet Take 1 tablet (5 mg total) by mouth 2 (two) times daily. 180 tablet 2   atorvastatin (LIPITOR) 10 MG tablet Take 10 mg by mouth every morning.  10   diltiazem (CARDIZEM CD) 240 MG 24 hr capsule TAKE (1) CAPSULE DAILY. 30 capsule 9   dofetilide (TIKOSYN) 500 MCG capsule TAKE ONE CAPSULE BY MOUTH TWICE DAILY 180 capsule 1   dutasteride (AVODART) 0.5 MG capsule Take 0.5 mg by mouth every morning.  2   furosemide (LASIX) 40 MG tablet Take 60 mg by mouth 4 (four) times a week.     metoprolol tartrate (LOPRESSOR) 25 MG tablet Take 1 tablet (25 mg total) by mouth daily. 90 tablet 3   mirtazapine (REMERON) 30 MG tablet TAKE ONE TABLET AT BEDTIME. 90 tablet 0   Multiple Vitamin (MULTIVITAMIN) tablet Take 1 tablet by mouth daily.     Omega-3 Fatty Acids (FISH OIL) 1200 MG CAPS Take 1,200 mg by mouth daily.     pantoprazole (PROTONIX) 40 MG tablet Take 1 tablet (40 mg total) by mouth daily.     Polyethyl Glycol-Propyl Glycol (SYSTANE) 0.4-0.3 % SOLN Place 1 drop into both eyes 2 (two) times daily.     potassium chloride SA (KLOR-CON) 20 MEQ tablet TAKE 1 TABLET BY MOUTH IN THE MORNING, TAKE 1/2 TABLET BY MOUTH IN THE P.M. 135 tablet 3   Probiotic Product (PROBIOTIC DAILY PO) Take 1 tablet by mouth daily.     pyridOXINE (VITAMIN B-6) 100 MG tablet Take 100 mg by mouth daily.  Respiratory Therapy Supplies (FLUTTER) DEVI Use the flutter valve 10 times when doing it daily. 1 each 0   spironolactone (ALDACTONE) 25 MG tablet Take 0.5 tablets (12.5 mg total) by mouth daily. 90  tablet 2   tamsulosin (FLOMAX) 0.4 MG CAPS capsule Take 0.4 mg by mouth daily after breakfast.  2   Turmeric 500 MG CAPS Take 500 mg by mouth daily.     vitamin B-12 (CYANOCOBALAMIN) 1000 MCG tablet Take 1,000 mcg by mouth daily.     No current facility-administered medications for this visit.    No Known Allergies    Review of Systems:   General:  normal appetite, + decreased energy, no weight gain, no weight loss, no fever  Cardiac:  no chest pain with exertion, no chest pain at rest, +SOB with mild exertion, no resting SOB, no PND, no orthopnea, no palpitations, + arrhythmia, + atrial fibrillation, + LE edema, + dizzy spells, no syncope  Respiratory:  + exertional shortness of breath, no home oxygen, no productive cough, no dry cough, no bronchitis, no wheezing, no hemoptysis, no asthma, no pain with inspiration or cough, + sleep apnea, + CPAP at night  GI:   no difficulty swallowing, no reflux, no frequent heartburn, no hiatal hernia, no abdominal pain, no constipation, no diarrhea, no hematochezia, no hematemesis, no melena  GU:   no dysuria,  n frequency, ono urinary tract infection, no hematuria, no enlarged prostate, no kidney stones, no kidney disease  Vascular:  no pain suggestive of claudication, no pain in feet, no leg cramps, no varicose veins, no DVT, no non-healing foot ulcer  Neuro:   no stroke, no TIA's, no seizures, no headaches, no temporary blindness one eye,  no slurred speech, + peripheral neuropathy, no chronic pain, no instability of gait, no memory/cognitive dysfunction  Musculoskeletal: + arthritis - primarily involving the knees, no joint swelling, no myalgias, no difficulty walking, normal mobility   Skin:   no rash, no itching, no skin infections, no pressure sores or ulcerations  Psych:   no anxiety, no depression, no nervousness, no unusual recent stress  Eyes:   no blurry vision, no floaters, no recent vision changes, + wears glasses  ENT:   no hearing loss, no  loose or painful teeth, no dentures, last saw dentist this year  Hematologic:  no easy bruising, no abnormal bleeding, no clotting disorder, no frequent epistaxis  Endocrine:  no diabetes, does not check CBG's at home     Physical Exam:   BP 112/69 (BP Location: Right Arm, Patient Position: Sitting)   Pulse 61   Resp 18   Ht 5' 10" (1.778 m)   Wt 213 lb (96.6 kg)   SpO2 93% Comment: RA  BMI 30.56 kg/m   General:  well-appearing  HEENT:  Unremarkable, NCAT, PERLA, EOMI  Neck:   no JVD, no bruits, no adenopathy   Chest:   clear to auscultation, symmetrical breath sounds, no wheezes, no rhonchi   CV:   RRR, 3/6 systolic murmur RSB, no diastolic murmur  Abdomen:  soft, non-tender, no masses   Extremities:  warm, well-perfused, pulses palpable at ankles, no lower extremity edema  Rectal/GU  Deferred  Neuro:   Grossly non-focal and symmetrical throughout  Skin:   Clean and dry, no rashes, no breakdown  Diagnostic Tests:  ECHOCARDIOGRAM REPORT         Patient Name:   Patrick Franco   Date of Exam: 05/04/2022  Medical Rec #:  [id number]  Height:       70.0 in  Accession #:    [id number]    Weight:       220.2 lb  Date of Birth:  25-Sep-1932     BSA:          2.174 m  Patient Age:    34 years      BP:           126/70 mmHg  Patient Gender: M             HR:           72 bpm.  Exam Location:  [facility]   Procedure: 2D Echo, Cardiac Doppler and Color Doppler   Indications:    I35.0 Aortic Stenosis     History:        Patient has prior history of Echocardiogram examinations,  most                  recent 07/16/2021. Mitral Valve Prolapse, Arrythmias:Atrial                  Fibrillation; Risk Factors:Hypertension and Sleep Apnea.     Sonographer:    Wilford Sports Rodgers-Jones RDCS  Referring Phys: [place]     1. Left ventricular ejection fraction, by estimation, is 60 to 65%. The  left ventricle has normal function. The left ventricle has no  regional  wall motion abnormalities. There is mild concentric left ventricular  hypertrophy. Left ventricular diastolic  parameters are consistent with Grade II diastolic dysfunction  (pseudonormalization). Elevated left ventricular end-diastolic pressure.   2. Right ventricular systolic function is normal. The right ventricular  size is normal. There is mildly elevated pulmonary artery systolic  pressure.   3. Left atrial size was severely dilated.   4. Right atrial size was mildly dilated.   5. The mitral valve is abnormal. Mild mitral valve regurgitation. Mild  mitral stenosis. There is moderate holosystolic prolapse of both leaflets  of the mitral valve. Severe mitral annular calcification.   6. The aortic valve is calcified. There is severe calcifcation of the  aortic valve. There is severe thickening of the aortic valve. Aortic valve  regurgitation is not visualized. Moderate to severe aortic valve stenosis.  Aortic valve area, by VTI  measures 1.00 cm. Aortic valve mean gradient measures 38.4 mmHg. Aortic  valve Vmax measures 3.95 m/s.   7. The inferior vena cava is normal in size with greater than 50%  respiratory variability, suggesting right atrial pressure of 3 mmHg.   8. Cannot exclude a small PFO.   Comparison(s): Changes from prior study are noted. AoV mean gradient  increased to 39 mmHg, peak velocity increased to 3.94 m/s, consistent with  moderate to severe AS.   FINDINGS   Left Ventricle: Left ventricular ejection fraction, by estimation, is 60  to 65%. The left ventricle has normal function. The left ventricle has no  regional wall motion abnormalities. The left ventricular internal cavity  size was normal in size. There is   mild concentric left ventricular hypertrophy. Left ventricular diastolic  parameters are consistent with Grade II diastolic dysfunction  (pseudonormalization). Elevated left ventricular end-diastolic pressure.   Right Ventricle: The right  ventricular size is normal. No increase in  right ventricular wall thickness. Right ventricular systolic function is  normal. There is mildly elevated pulmonary artery systolic pressure. The  tricuspid regurgitant velocity is 3.07   m/s, and with an assumed  right atrial pressure of 3 mmHg, the estimated  right ventricular systolic pressure is 86.5 mmHg.   Left Atrium: Left atrial size was severely dilated.   Right Atrium: Right atrial size was mildly dilated.   Pericardium: There is no evidence of pericardial effusion.   Mitral Valve: The mitral valve is abnormal. There is moderate holosystolic  prolapse of both leaflets of the mitral valve. Severe mitral annular  calcification. Mild mitral valve regurgitation. Mild mitral valve  stenosis. MV peak gradient, 11.2 mmHg. The  mean mitral valve gradient is 4.0 mmHg.   Tricuspid Valve: The tricuspid valve is normal in structure. Tricuspid  valve regurgitation is trivial. No evidence of tricuspid stenosis.   Aortic Valve: The aortic valve is calcified. There is severe calcifcation  of the aortic valve. There is severe thickening of the aortic valve.  Aortic valve regurgitation is not visualized. Moderate to severe aortic  stenosis is present. Aortic valve mean  gradient measures 38.4 mmHg. Aortic valve peak gradient measures 62.3  mmHg. Aortic valve area, by VTI measures 1.00 cm.   Pulmonic Valve: The pulmonic valve was not well visualized. Pulmonic valve  regurgitation is mild to moderate. No evidence of pulmonic stenosis.   Aorta: The aortic root, ascending aorta and aortic arch are all  structurally normal, with no evidence of dilitation or obstruction.   Venous: The inferior vena cava is normal in size with greater than 50%  respiratory variability, suggesting right atrial pressure of 3 mmHg.   IAS/Shunts: Cannot exclude a small PFO.      LEFT VENTRICLE  PLAX 2D  LVIDd:         5.10 cm   Diastology  LVIDs:         2.70 cm    LV e' medial:    5.11 cm/s  LV PW:         1.20 cm   LV E/e' medial:  29.1  LV IVS:        1.20 cm   LV e' lateral:   5.66 cm/s  LVOT diam:     2.20 cm   LV E/e' lateral: 26.2  LV SV:         89  LV SV Index:   41  LVOT Area:     3.80 cm      RIGHT VENTRICLE             IVC  RV Basal diam:  3.03 cm     IVC diam: 1.30 cm  RV S prime:     18.50 cm/s  TAPSE (M-mode): 2.1 cm   LEFT ATRIUM             Index        RIGHT ATRIUM           Index  LA diam:        6.40 cm 2.94 cm/m   RA Area:     18.40 cm  LA Vol (A2C):   85.8 ml 39.46 ml/m  RA Volume:   54.60 ml  25.11 ml/m  LA Vol (A4C):   97.3 ml 44.75 ml/m  LA Biplane Vol: 93.4 ml 42.95 ml/m   AORTIC VALVE  AV Area (Vmax):    0.93 cm  AV Area (Vmean):   0.85 cm  AV Area (VTI):     1.00 cm  AV Vmax:           394.60 cm/s  AV Vmean:  288.000 cm/s  AV VTI:            0.891 m  AV Peak Grad:      62.3 mmHg  AV Mean Grad:      38.4 mmHg  LVOT Vmax:         96.75 cm/s  LVOT Vmean:        64.700 cm/s  LVOT VTI:          0.234 m  LVOT/AV VTI ratio: 0.26     AORTA  Ao Root diam: 4.00 cm  Ao Asc diam:  3.70 cm   MITRAL VALVE                TRICUSPID VALVE  MV Area (PHT): 3.95 cm     TR Peak grad:   37.7 mmHg  MV Area VTI:   1.62 cm     TR Vmax:        307.00 cm/s  MV Peak grad:  11.2 mmHg  MV Mean grad:  4.0 mmHg     SHUNTS  MV Vmax:       1.67 m/s     Systemic VTI:  0.23 m  MV Vmean:      92.3 cm/s    Systemic Diam: 2.20 cm  MV Decel Time: 192 msec  MR Peak grad: 130.8 mmHg  MR Mean grad: 88.8 mmHg  MR Vmax:      571.75 cm/s  MR Vmean:     450.3 cm/s  MV E velocity: 148.50 cm/s  MV A velocity: 118.00 cm/s  MV E/A ratio:  1.26   Buford Dresser MD  Electronically signed by Buford Dresser MD  Signature Date/Time: 05/04/2022/2:50:41 PM         Final     Physicians  Panel Physicians Referring Physician Case Authorizing Physician  Burnell Blanks, MD (Primary)      Procedures  RIGHT/LEFT HEART CATH AND CORONARY ANGIOGRAPHY   Conclusion      Ost RCA to Mid RCA lesion is 20% stenosed.   Mid Cx lesion is 40% stenosed.   Dist LAD lesion is 99% stenosed.   Prox LAD to Mid LAD lesion is 30% stenosed.   The LAD has diffuse mild to moderate proximal and mid stenosis. There is a severe, heavily calcified stenosis in the mid to distal LAD. There appears to be a completely occluded branch just beyond this stenosis and then the LAD continues to the apex.  The Circumflex has a moderate mid stenosis The RCA is a large dominant vessel with heavy calcification throughout. Mild plaque in the proximal and mid vessel.    Recommendations: His mid to distal LAD lesion is heavily calcified. I was unable to cross the lesion today. I think we should continue with workup for TAVR. Will treat his CAD medically for now. He has no angina. If we complete his TAVR and he continues to have dyspnea on exertion, can then consider complex PCI of the mid to distal LAD with orbital atherectomy and stenting.    Indications  Severe aortic stenosis [I35.0 (ICD-10-CM)]  Coronary artery disease involving native coronary artery of native heart without angina pectoris [I25.10 (ICD-10-CM)]   Procedural Details  Technical Details Indication: 88 yo male with severe aortic stenosis with recent worsening dyspnea on exertion.   Procedure: The risks, benefits, complications, treatment options, and expected outcomes were discussed with the patient. The patient and/or family concurred with the proposed plan, giving informed consent. The patient was brought to  the cath lab after IV hydration was given. The patient was sedated with Versed and Fentanyl. The IV catheter in the right antecubital vein was changed for a 5 [place] sheath. Right heart catheterization was performed with a balloon tipped catheter. I was unable to pass the catheter into the RV despite having a wire out in the PA. RA pressure  measured. The right wrist was prepped and draped in a sterile fashion. 1% lidocaine was used for local anesthesia. Using the modified Seldinger access technique, a 5 French sheath was placed in the right radial artery. 3 mg Verapamil was given through the sheath. Weight based IV heparin was given. Standard diagnostic catheters were used to perform selective coronary angiography. I did not cross the aortic valve. All catheter exchanges were performed over an exchange length guidewire.   PCI Note: I attempted PCI of the mid to distal LAD stenosis. Plavix 600 mg po x 1.  I engaged the left main with a XB LAD 3.5 guiding catheter and passed a Cougar IC wire down the LAD. I was unable to pass a balloon beyond the severe, heavily calcified mid LAD stenosis. A Telescope guide extension catheter was placed into the left main for better support but I was still unable to pass the balloon into the distal LAD beyond the severe stenosis. The PCI was aborted.   The sheath was removed from the right radial artery and a hemostasis band was applied at the arteriotomy site on the right wrist.      Estimated blood loss <50 mL.   During this procedure medications were administered to achieve and maintain moderate conscious sedation while the patient's heart rate, blood pressure, and oxygen saturation were continuously monitored and I was present face-to-face 100% of this time.   Medications (Filter: Administrations occurring from 0820 to 0940 on 05/26/22) Heparin (Porcine) in NaCl 1000-0.9 UT/500ML-% SOLN (mL) Total volume:  1,000 mL  Date/Time Rate/Dose/Volume Action   05/26/22 0824 500 mL Given   0824 500 mL Given    fentaNYL (SUBLIMAZE) injection (mcg) Total dose:  50 mcg  Date/Time Rate/Dose/Volume Action   05/26/22 0832 25 mcg Given   0912 25 mcg Given    midazolam (VERSED) injection (mg) Total dose:  2 mg  Date/Time Rate/Dose/Volume Action   05/26/22 0832 1 mg Given   0912 1 mg Given     lidocaine (PF) (XYLOCAINE) 1 % injection (mL) Total volume:  5 mL  Date/Time Rate/Dose/Volume Action   05/26/22 0842 5 mL Given    Radial Cocktail/Verapamil only (mL) Total volume:  10 mL  Date/Time Rate/Dose/Volume Action   05/26/22 0843 10 mL Given    heparin sodium (porcine) injection (Units) Total dose:  13,000 Units  Date/Time Rate/Dose/Volume Action   05/26/22 0904 10,000 Units Given   0919 3,000 Units Given    clopidogrel (PLAVIX) tablet (mg) Total dose:  600 mg  Date/Time Rate/Dose/Volume Action   05/26/22 0905 600 mg Given    famotidine (PEPCID) IVPB 20 mg premix (mg) Total dose:  20 mg  Date/Time Rate/Dose/Volume Action   05/26/22 0911 20 mg New Bag/Given    iohexol (OMNIPAQUE) 350 MG/ML injection (mL) Total volume:  120 mL  Date/Time Rate/Dose/Volume Action   05/26/22 0929 120 mL Given    Sedation Time  Sedation Time Physician-1: 49 minutes 11 seconds Contrast  Medication Name Total Dose  iohexol (OMNIPAQUE) 350 MG/ML injection 120 mL   Radiation/Fluoro  Fluoro time: 14.8 (min) DAP: 38756 (mGycm2) Cumulative Air Kerma: 412 (  mGy) Complications  Complications documented before study signed (05/26/2022  6:07 AM)   No complications were associated with this study.  Documented by Renne Musca, RT - 05/26/2022  9:23 AM     Coronary Findings  Diagnostic Dominance: Right Left Anterior Descending  Vessel is large.  Prox LAD to Mid LAD lesion is 30% stenosed.  Dist LAD lesion is 99% stenosed. The lesion is calcified.    Left Circumflex  Vessel is large.  Mid Cx lesion is 40% stenosed. The lesion is calcified.    Right Coronary Artery  Vessel is large.  Ost RCA to Mid RCA lesion is 20% stenosed. The lesion is calcified.    Intervention   No interventions have been documented.   Coronary Diagrams  Diagnostic Dominance: Right  Intervention  Implants     No implant documentation for this case.   Syngo Images   Show images for  CARDIAC CATHETERIZATION Images on Long Term Storage   Show images for [place], [place] to Procedure Log  Procedure Log    Hemo Data  Flowsheet Row Most Recent Value  RA A Wave 5 mmHg  RA V Wave 1 mmHg  RA Mean 0 mmHg  AO Systolic Pressure 371 mmHg  AO Diastolic Pressure 73 mmHg  AO Mean 99 mmHg    ADDENDUM REPORT: 06/07/2022 18:02   CLINICAL DATA:  Severe Aortic Stenosis.   EXAM: Cardiac TAVR CT   TECHNIQUE: A non-contrast, gated CT scan was obtained with axial slices of 3 mm through the heart for aortic valve calcium scoring. A 120 kV retrospective, gated, contrast cardiac scan was obtained. Gantry rotation speed was 250 msecs and collimation was 0.6 mm. Nitroglycerin was not given. The 3D data set was reconstructed in 5% intervals of the 0-95% of the R-R cycle. Systolic and diastolic phases were analyzed on a dedicated workstation using MPR, MIP, and VRT modes. The patient received 100 cc of contrast.   FINDINGS: Image quality: Excellent.   Noise artifact is: Limited.   Valve Morphology: Tricuspid aortic valve with severe diffuse calcifications. Restricted movement in systole.   Aortic Valve Calcium score: 4409   Aortic annular dimension:   Phase assessed: 15%   Annular area: 521 mm2   Annular perimeter: 83.1 mm   Max diameter: 29.6 mm   Min diameter: 21.9 mm   Annular and subannular calcification: Moderate protruding calcium under the [place].   Membranous septum length: 11.2 mm   Optimal coplanar projection: LAO 37 CRA 8   Coronary Artery Height above Annulus:   Left Main: 21.8 mm   Right Coronary: 19.1 mm   Sinus of Valsalva Measurements:   Non-coronary: 36 mm   Right-coronary: 35 mm   Left-coronary: 37 mm   Sinus of Valsalva Height:   Non-coronary: 24.9 mm   Right-coronary: 25.2 mm   Left-coronary: 30.2 mm   Sinotubular Junction: 33 mm   Ascending Thoracic Aorta: 34 mm   Coronary Arteries: Normal coronary origin. Right  dominance. The study was performed without use of NTG and is insufficient for plaque evaluation. Please refer to recent cardiac catheterization for coronary assessment. 3-vessel coronary calcifications.   Cardiac Morphology:   Right Atrium: Right atrial size is within normal limits.   Right Ventricle: The right ventricular cavity is within normal limits.   Left Atrium: Left atrial size is normal in size with no left atrial appendage filling defect.   Left Ventricle: The ventricular cavity size is within normal limits.   Pulmonary arteries:  Dilated suggestive of pulmonary hypertension.   Pulmonary veins: Normal pulmonary venous drainage.   Pericardium: Normal thickness with no significant effusion or calcium present.   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   Extra-cardiac findings: See attached radiology report for non-cardiac structures.   IMPRESSION: 1. Tricuspid aortic valve with severe diffuse calcifications.   2. Annular measurements appropriate for a 26 mm S3 (521 mm2).   3. Moderate protruding calcium under the [place].   4. Sufficient coronary to annulus distance.   5. Optimal Fluoroscopic Angle for Delivery: LAO 37 CRA 8   6. Dilated pulmonary artery suggestive of pulmonary hypertension.   [place] T. Audie Box, MD     Electronically Signed   By: Eleonore Chiquito M.D.   On: 06/07/2022 18:02    Addended by Geralynn Rile, MD on 06/07/2022  6:04 PM   Study Result  Narrative & Impression  EXAM: OVER-READ INTERPRETATION  CT CHEST   The following report is a limited chest CT over-read performed by radiologist Dr. Suzy Bouchard of [facility], [place] on 06/07/2022. The CTA/cardiac morphology interpretation by the cardiologist is attached.   COMPARISON:  None Available.   FINDINGS: Limited view of the lung parenchyma demonstrates mild LEFT lingular atelectasis. Airways are normal.   Limited  view of the mediastinum demonstrates no adenopathy. Esophagus normal.   Limited view of the upper abdomen unremarkable. Benign central hepatic cyst   Limited view of the skeleton and chest wall is unremarkable. Scoliosis of the spine.   IMPRESSION: No significant extracardiac findings.   Electronically Signed: By: Suzy Bouchard M.D. On: 06/07/2022 16:33      Narrative & Impression  CLINICAL DATA:  88 year old male history of severe aortic stenosis. Preprocedural study prior to potential transcatheter aortic valve replacement (TAVR) procedure.   EXAM: CT ANGIOGRAPHY CHEST, ABDOMEN AND PELVIS   TECHNIQUE: Multidetector CT imaging through the chest, abdomen and pelvis was performed using the standard protocol during bolus administration of intravenous contrast. Multiplanar reconstructed images and MIPs were obtained and reviewed to evaluate the vascular anatomy.   RADIATION DOSE REDUCTION: This exam was performed according to the departmental dose-optimization program which includes automated exposure control, adjustment of the mA and/or kV according to patient size and/or use of iterative reconstruction technique.   CONTRAST:  186m OMNIPAQUE IOHEXOL 350 MG/ML SOLN   COMPARISON:  Chest CT 08/11/2020. No prior CT the abdomen and pelvis.   FINDINGS: CTA CHEST FINDINGS   Cardiovascular: Heart size is mildly enlarged with left atrial dilatation. There is no significant pericardial fluid, thickening or pericardial calcification. There is aortic atherosclerosis, as well as atherosclerosis of the great vessels of the mediastinum and the coronary arteries, including calcified atherosclerotic plaque in the left main, left anterior descending, left circumflex and right coronary arteries. Thickening and calcification of the aortic valve. Severe calcifications of the mitral annulus. Dilatation of the pulmonic trunk (4.1 cm in diameter).   Mediastinum/Lymph Nodes: No  pathologically enlarged mediastinal or hilar lymph nodes. Esophagus is unremarkable in appearance. No axillary lymphadenopathy. Heterogeneously enhancing nodule in the left lobe of the thyroid gland extending into the isthmus (axial image 36 of series 4) measuring 3.7 x 3.3 cm, similar to prior examinations, previously biopsied (no further imaging is recommended).   Lungs/Pleura: No acute consolidative airspace disease. No pleural effusions. Bibasilar areas of scarring and/or atelectasis are noted in the lungs. No definite suspicious appearing pulmonary nodules or masses are noted.   Musculoskeletal/Soft Tissues: There are  no aggressive appearing lytic or blastic lesions noted in the visualized portions of the skeleton. S shaped scoliosis of the thoracolumbar spine convex to the left superiorly and to the right inferiorly in the thoracic region.   CTA ABDOMEN AND PELVIS FINDINGS   Hepatobiliary: Diffuse low attenuation throughout the hepatic parenchyma, indicative of a background of hepatic steatosis. 2.3 cm low-attenuation lesion in segment 4A of the liver, compatible with a simple cyst. No aggressive appearing hepatic lesions. No intra or extrahepatic biliary ductal dilatation. Gallbladder is unremarkable in appearance.   Pancreas: No pancreatic mass. No pancreatic ductal dilatation. No pancreatic or peripancreatic fluid collections or inflammatory changes.   Spleen: Unremarkable.   Adrenals/Urinary Tract: Right kidney and bilateral adrenal glands are normal in appearance. 2.1 cm low-attenuation lesion in the interpolar region of the left kidney, compatible with a simple cyst (no imaging follow-up is recommended). No hydroureteronephrosis. Urinary bladder wall appears mildly thickened with multiple small trabeculations and 8 mm diverticulum on the right side.   Stomach/Bowel: Stomach is unremarkable in appearance. No pathologic dilatation of small bowel or colon. Numerous  colonic diverticulae are noted, particularly in the sigmoid colon without definite focal surrounding inflammatory changes to clearly indicate an acute diverticulitis at this time. Normal appendix.   Vascular/Lymphatic: Aortic atherosclerosis, without evidence of aneurysm or dissection in the abdominal or pelvic vasculature. Vascular findings and measurements pertinent to potential TAVR procedure, as detailed below. No lymphadenopathy noted in the abdomen or pelvis.   Reproductive: Prostate gland and seminal vesicles are largely obscured by beam hardening artifact from the patient's right hip arthroplasty, but visualized portions are unremarkable.   Other: Small umbilical hernia containing fat and a small volume of fluid. No other significant volume of ascites. No pneumoperitoneum.   Musculoskeletal: Status post right hip arthroplasty. There are no aggressive appearing lytic or blastic lesions noted in the visualized portions of the skeleton. Levoscoliosis in the lumbar spine.   VASCULAR MEASUREMENTS PERTINENT TO TAVR:   AORTA:   Minimal Aortic Diameter-17 x 16 mm   Severity of Aortic Calcification-mild   RIGHT PELVIS:   Right Common Iliac Artery -   Minimal Diameter-11.6 x 10.9 mm   Tortuosity-mild   Calcification-moderate   Right External Iliac Artery -   Minimal Diameter-9.2 x 9.4 mm   Tortuosity-severe   Calcification-mild   Right Common Femoral Artery -   Minimal Diameter-9.8 x 9.9 mm   Tortuosity-mild   Calcification-mild   LEFT PELVIS:   Left Common Iliac Artery -   Minimal Diameter-13.0 x 11.1 mm   Tortuosity-moderate   Calcification-moderate   Left External Iliac Artery -   Minimal Diameter-9.9 x 10.5 mm   Tortuosity-severe   Calcification-none   Left Common Femoral Artery -   Minimal Diameter-9.1 x 9.9 mm   Tortuosity-mild   Calcification-mild   Review of the MIP images confirms the above findings.   IMPRESSION: 1.  Vascular findings and measurements pertinent to potential TAVR procedure, as detailed above. 2. Severe thickening calcification of the aortic valve, compatible with reported clinical history of severe aortic stenosis. 3. Cardiomegaly with left atrial dilatation. 4. There are calcifications of the mitral annulus. Echocardiographic correlation for evaluation of potential valvular dysfunction may be warranted if clinically indicated. 5. Aortic atherosclerosis, in addition to left main and three-vessel coronary artery disease. 6. Dilatation of the pulmonic trunk (4.1 cm in diameter), concerning for pulmonary arterial hypertension. 7. Hepatic steatosis. 8. Severe colonic diverticulosis without evidence of acute diverticulitis at this time. 9. Umbilical  hernia containing omental fat and a small volume of fluid. No associated bowel incarceration or obstruction at this time. 10. Additional incidental findings, as above.     Electronically Signed   By: Vinnie Langton M.D.   On: 06/08/2022 08:46     Impression:  This 88 year old gentleman has stage D, severe, symptomatic aortic stenosis with New York Heart Association class II symptoms of exertional fatigue and shortness of breath consistent with chronic diastolic congestive heart failure.  I have personally reviewed his 2D echocardiogram, cardiac catheterization, and CTA studies.  His echocardiogram shows a severely calcified and thickened aortic valve with restricted leaflet mobility. The mean gradient has increased to 38.4 mmHg with a peak gradient of 62 mmHg and a valve area 1.0 cm by VTI consistent with severe aortic stenosis.  There is severe mitral annular calcification with mild mitral valve regurgitation.  Left ventricular ejection fraction is 60 to 65% with mild concentric LVH and grade 2 diastolic dysfunction.  Cardiac catheterization shows a 99% mid to distal LAD stenosis which is heavily calcified and could not be crossed at the  time of catheterization.  The patient denies any chest pain or pressure.  I agree that aortic valve replacement is indicated for relief of his progressive symptoms and to prevent left ventricular dysfunction.  Given his advanced age I think that transcatheter aortic valve replacement would be the best treatment for him.  I think his LAD stenosis can be treated medically for now and if he develops any chest discomfort symptoms after TAVR or has persistent exertional shortness of breath and I think atherectomy and PCI could be entertained.  His gated cardiac CTA shows anatomy suitable for TAVR using a SAPIEN 3 valve.  His valve appears to be trileaflet with some protruding calcium under the noncoronary cusp.  His abdominal and pelvic CTA shows adequate pelvic vascular anatomy to allow transfemoral insertion.  The patient and his wife were counseled at length regarding treatment alternatives for management of severe symptomatic aortic stenosis. The risks and benefits of surgical intervention has been discussed in detail. Long-term prognosis with medical therapy was discussed. Alternative approaches such as conventional surgical aortic valve replacement, transcatheter aortic valve replacement, and palliative medical therapy were compared and contrasted at length. This discussion was placed in the context of the patient's own specific clinical presentation and past medical history. All of their questions have been addressed.   Following the decision to proceed with transcatheter aortic valve replacement, a discussion was held regarding what types of management strategies would be attempted intraoperatively in the event of life-threatening complications, including whether or not the patient would be considered a candidate for the use of cardiopulmonary bypass and/or conversion to open sternotomy for attempted surgical intervention.  He is in relatively good shape for 88 years old and I think he would be a candidate  for emergency sternotomy to manage any intraoperative complications.  The patient is aware of the fact that transient use of cardiopulmonary bypass may be necessary. The patient has been advised of a variety of complications that might develop including but not limited to risks of death, stroke, paravalvular leak, aortic dissection or other major vascular complications, aortic annulus rupture, device embolization, cardiac rupture or perforation, mitral regurgitation, acute myocardial infarction, arrhythmia, heart block or bradycardia requiring permanent pacemaker placement, congestive heart failure, respiratory failure, renal failure, pneumonia, infection, other late complications related to structural valve deterioration or migration, or other complications that might ultimately cause a temporary or permanent loss  of functional independence or other long term morbidity. The patient provides full informed consent for the procedure as described and all questions were answered.      Plan:  He will be scheduled for transfemoral TAVR using a SAPIEN 3 valve on 07/26/2022.  His Eliquis will be stopped 5 days preoperatively.  I spent 60 minutes performing this consultation and > 50% of this time was spent face to face counseling and coordinating the care of this patient's severe symptomatic aortic stenosis.   Gaye Pollack, MD 07/20/2022

## 2022-07-21 NOTE — Pre-Procedure Instructions (Signed)
Surgical Instructions    Your procedure is scheduled on Tuesday September 12.  Report to [name] Main Entrance "A" at 11:45 A.M., then check in with the Admitting office.  Call this number if you have problems the morning of surgery:  [phone]   If you have any questions prior to your surgery date call [phone]: Open Monday-Friday 8am-4pm    Remember:  Do not eat or drink after midnight the night before your surgery   STOP now, 9/6, taking any Aspirin (unless otherwise instructed by your surgeon), Aleve, Naproxen, Ibuprofen, Motrin, Advil, Goody's, BC's, all herbal medications, fish oil, and all non-prescription vitamins.   Stop taking Eliquis on Thursday, 9/7. You will take your last dose on Wednesday, 9/6.     Continue taking all other medications without change through the day before surgery.   On the morning of surgery do not take any medications.          Do not wear jewelry. Do not wear lotions, powders, cologne or deodorant. Men may shave face and neck. Do not bring valuables to the hospital. [facility] is not responsible for any belongings or valuables.    Do NOT Smoke (Tobacco/Vaping)  24 hours prior to your procedure  If you use a CPAP at night, you may bring your mask for your overnight stay.   Contacts, glasses, hearing aids, dentures or partials may not be worn into surgery, please bring cases for these belongings   For patients admitted to the hospital, discharge time will be determined by your treatment team.   Patients discharged the day of surgery will not be allowed to drive home, and someone needs to stay with them for 24 hours.   SURGICAL WAITING ROOM VISITATION Patients having surgery or a procedure may have no more than 2 support people in the waiting area - these visitors may rotate.   Children under the age of 78 must have an adult with them who is not the patient. If the patient needs to stay at the hospital during part of their recovery,  the visitor guidelines for inpatient rooms apply. Pre-op nurse will coordinate an appropriate time for 1 support person to accompany patient in pre-op.  This support person may not rotate.   Please refer to the [facility] website for the visitor guidelines for Inpatients (after your surgery is over and you are in a regular room).    Special instructions:    Oral Hygiene is also important to reduce your risk of infection.  Remember - BRUSH YOUR TEETH THE MORNING OF SURGERY WITH YOUR REGULAR TOOTHPASTE   [place]- Preparing For Surgery  Before surgery, you can play an important role. Because skin is not sterile, your skin needs to be as free of germs as possible. You can reduce the number of germs on your skin by washing with CHG (chlorahexidine gluconate) Soap before surgery.  CHG is an antiseptic cleaner which kills germs and bonds with the skin to continue killing germs even after washing.     Please do not use if you have an allergy to CHG or antibacterial soaps. If your skin becomes reddened/irritated stop using the CHG.  Do not shave (including legs and underarms) for at least 48 hours prior to first CHG shower. It is OK to shave your face.  Please follow these instructions carefully.     Shower the NIGHT BEFORE SURGERY and the MORNING OF SURGERY with CHG Soap.   If you chose to wash your hair,  wash your hair first as usual with your normal shampoo. After you shampoo, rinse your hair and body thoroughly to remove the shampoo.  Then [employer] and genitals (private parts) with your normal soap and rinse thoroughly to remove soap.  After that Use CHG Soap as you would any other liquid soap. You can apply CHG directly to the skin and wash gently with a scrungie or a clean washcloth.   Apply the CHG Soap to your body ONLY FROM THE NECK DOWN.  Do not use on open wounds or open sores. Avoid contact with your eyes, ears, mouth and genitals (private parts). Wash Face and genitals (private  parts)  with your normal soap.   Wash thoroughly, paying special attention to the area where your surgery will be performed.  Thoroughly rinse your body with warm water from the neck down.  DO NOT shower/wash with your normal soap after using and rinsing off the CHG Soap.  Pat yourself dry with a CLEAN TOWEL.  Wear CLEAN PAJAMAS to bed the night before surgery  Place CLEAN SHEETS on your bed the night before your surgery  DO NOT SLEEP WITH PETS.   Day of Surgery:  Take a shower with CHG soap. Wear Clean/Comfortable clothing the morning of surgery Do not apply any deodorants/lotions.   Remember to brush your teeth WITH YOUR REGULAR TOOTHPASTE.    If you received a COVID test during your pre-op visit, it is requested that you wear a mask when out in public, stay away from anyone that may not be feeling well, and notify your surgeon if you develop symptoms. If you have been in contact with anyone that has tested positive in the last 10 days, please notify your surgeon.    Please read over the following fact sheets that you were given.

## 2022-07-22 ENCOUNTER — Other Ambulatory Visit: Payer: Self-pay

## 2022-07-22 ENCOUNTER — Encounter (HOSPITAL_COMMUNITY)
Admission: RE | Admit: 2022-07-22 | Discharge: 2022-07-22 | Disposition: A | Discharge status: Home / Self Care | Payer: Medicare Other | Source: Ambulatory Visit | Attending: Cardiovascular Disease | Admitting: Cardiovascular Disease

## 2022-07-22 ENCOUNTER — Encounter (HOSPITAL_COMMUNITY): Payer: Self-pay

## 2022-07-22 ENCOUNTER — Ambulatory Visit (HOSPITAL_COMMUNITY)
Admission: RE | Admit: 2022-07-22 | Discharge: 2022-07-22 | Disposition: A | Discharge status: Home / Self Care | Payer: Medicare Other | Source: Ambulatory Visit | Attending: Cardiovascular Disease | Admitting: Cardiovascular Disease

## 2022-07-22 VITALS — BP 136/74 | HR 70 | Temp 97.6°F | Resp 17 | Ht 70.0 in | Wt 212.6 lb

## 2022-07-22 DIAGNOSIS — I350 Nonrheumatic aortic (valve) stenosis: Secondary | ICD-10-CM | POA: Diagnosis not present

## 2022-07-22 DIAGNOSIS — Z01818 Encounter for other preprocedural examination: Secondary | ICD-10-CM | POA: Diagnosis not present

## 2022-07-22 DIAGNOSIS — J9811 Atelectasis: Secondary | ICD-10-CM | POA: Diagnosis not present

## 2022-07-22 DIAGNOSIS — Z20822 Contact with and (suspected) exposure to covid-19: Secondary | ICD-10-CM | POA: Insufficient documentation

## 2022-07-22 HISTORY — DX: Dyspnea, unspecified: R06.00

## 2022-07-22 LAB — URINALYSIS, ROUTINE W REFLEX MICROSCOPIC
Bacteria, UA: NONE SEEN
Bilirubin Urine: NEGATIVE
Glucose, UA: NEGATIVE mg/dL
Hgb urine dipstick: NEGATIVE
Ketones, ur: NEGATIVE mg/dL
Leukocytes,Ua: NEGATIVE
Nitrite: NEGATIVE
Protein, ur: 30 mg/dL — AB
Specific Gravity, Urine: 1.019 (ref 1.005–1.030)
pH: 5 (ref 5.0–8.0)

## 2022-07-22 LAB — SURGICAL PCR SCREEN
MRSA, PCR: NEGATIVE
Staphylococcus aureus: NEGATIVE

## 2022-07-22 LAB — TYPE AND SCREEN
ABO/RH(D): A POS
Antibody Screen: NEGATIVE

## 2022-07-22 LAB — COMPREHENSIVE METABOLIC PANEL
ALT: 20 U/L (ref 0–44)
AST: 20 U/L (ref 15–41)
Albumin: 4.1 g/dL (ref 3.5–5.0)
Alkaline Phosphatase: 52 U/L (ref 38–126)
Anion gap: 9 (ref 5–15)
BUN: 22 mg/dL (ref 8–23)
CO2: 23 mmol/L (ref 22–32)
Calcium: 9.2 mg/dL (ref 8.9–10.3)
Chloride: 107 mmol/L (ref 98–111)
Creatinine, Ser: 1.1 mg/dL (ref 0.61–1.24)
GFR, Estimated: >60 mL/min (ref >60)
Glucose, Bld: 154 mg/dL — ABNORMAL HIGH (ref 70–99)
Potassium: 4.5 mmol/L (ref 3.5–5.1)
Sodium: 139 mmol/L (ref 135–145)
Total Bilirubin: 1.2 mg/dL (ref 0.3–1.2)
Total Protein: 6.8 g/dL (ref 6.5–8.1)

## 2022-07-22 LAB — CBC
HCT: 43.9 % (ref 39.0–52.0)
Hemoglobin: 14.9 g/dL (ref 13.0–17.0)
MCH: 34.3 pg — ABNORMAL HIGH (ref 26.0–34.0)
MCHC: 33.9 g/dL (ref 30.0–36.0)
MCV: 101.2 fL — ABNORMAL HIGH (ref 80.0–100.0)
Platelets: 179 10*3/uL (ref 150–400)
RBC: 4.34 MIL/uL (ref 4.22–5.81)
RDW: 13.6 % (ref 11.5–15.5)
WBC: 7.3 10*3/uL (ref 4.0–10.5)
nRBC: 0 % (ref 0.0–0.2)

## 2022-07-22 LAB — PROTIME-INR
INR: 1.1 (ref 0.8–1.2)
Prothrombin Time: 14 seconds (ref 11.4–15.2)

## 2022-07-22 NOTE — Progress Notes (Signed)
PCP - Jenny Reichmann griffin Cardiologist - Angelena Form EP Physician: Renaye Rakers Pulmonologist: Brand Males  PPM/ICD - denies   Chest x-ray - 07/22/22 EKG - 07/20/22 Stress Test - 08/19/21 ECHO - 05/04/22 Cardiac Cath - 05/26/22  Sleep Study - +OSA, autotitrate settings  STOP now, 9/6, taking any Aspirin (unless otherwise instructed by your surgeon), Aleve, Naproxen, Ibuprofen, Motrin, Advil, Goody's, BC's, all herbal medications, fish oil, and all non-prescription vitamins.   Stop taking Eliquis on Thursday, 9/7. You will take your last dose on Wednesday, 9/6.     Continue taking all other medications without change through the day before surgery.   On the morning of surgery do not take any medications.  ERAS Protcol -no   COVID TEST- 07/22/2022   Anesthesia review: yes, TAVR  Patient denies shortness of breath, fever, cough and chest pain at PAT appointment   All instructions explained to the patient, with a verbal understanding of the material. Patient agrees to go over the instructions while at home for a better understanding. Patient also instructed to self quarantine after being tested for COVID-19. The opportunity to ask questions was provided.

## 2022-07-23 LAB — SARS CORONAVIRUS 2 (TAT 6-24 HRS): SARS Coronavirus 2: NEGATIVE

## 2022-07-25 DIAGNOSIS — M5416 Radiculopathy, lumbar region: Secondary | ICD-10-CM | POA: Diagnosis not present

## 2022-07-25 DIAGNOSIS — M545 Low back pain, unspecified: Secondary | ICD-10-CM | POA: Diagnosis not present

## 2022-07-25 DIAGNOSIS — M25551 Pain in right hip: Secondary | ICD-10-CM | POA: Diagnosis not present

## 2022-07-25 MED ORDER — HEPARIN 30,000 UNITS/1000 ML (OHS) CELLSAVER SOLUTION
Status: DC
  Filled 2022-07-25: qty 1000

## 2022-07-25 MED ORDER — POTASSIUM CHLORIDE 2 MEQ/ML IV SOLN
80.0000 meq | INTRAVENOUS | Status: DC
  Filled 2022-07-25: qty 40

## 2022-07-25 MED ORDER — MAGNESIUM SULFATE 50 % IJ SOLN
40.0000 meq | INTRAMUSCULAR | Status: DC
  Filled 2022-07-25: qty 9.85

## 2022-07-25 MED ORDER — CEFAZOLIN SODIUM-DEXTROSE 2-4 GM/100ML-% IV SOLN
2.0000 g | INTRAVENOUS | Status: AC
  Administered 2022-07-26: 2 g via INTRAVENOUS
  Filled 2022-07-25: qty 100

## 2022-07-25 MED ORDER — DEXMEDETOMIDINE HCL IN NACL 400 MCG/100ML IV SOLN
0.1000 ug/kg/h | INTRAVENOUS | Status: AC
  Administered 2022-07-26: 48.2 ug via INTRAVENOUS
  Filled 2022-07-25: qty 100

## 2022-07-25 MED ORDER — NOREPINEPHRINE 4 MG/250ML-% IV SOLN
0.0000 ug/min | INTRAVENOUS | Status: AC
  Administered 2022-07-26: 2 ug/min via INTRAVENOUS
  Filled 2022-07-25: qty 250

## 2022-07-25 NOTE — H&P (Signed)
[address]       [place] [address]             [phone]      Cardiothoracic Surgery Admission History and Physical   PCP is Lavone Orn, MD Referring Provider is [name], MD Primary Cardiologist is [name], MD   Reason for admission: Severe aortic stenosis   HPI:   The patient is an 88 year old gentleman with history of hypertension, atrial fibrillation on Eliquis and Tikosyn, mitral valve prolapse, chronic diastolic congestive heart failure, OSA on CPAP, coronary artery disease, and aortic stenosis who was referred for consideration of transcatheter aortic valve replacement.  A 2D echo in September 2022 showed a mean gradient of 26.5 mmHg with a valve area by VTI of 0.95 cm.  The valve was severely calcified and thickened.  His most recent echo on 05/04/2022 shows an increase in the mean gradient to 38.4 mmHg with a valve area by VTI of 1 cm.  V-max is 3.95 m/s.  Left ventricular ejection fraction is 60 to 65% with grade 2 diastolic dysfunction.  He underwent a right and left heart cath on 05/26/2022 which showed a 99% stenosis in the mid to distal LAD and a heavily calcified area.  Left circumflex had 40% mid vessel stenosis.  The right coronary artery was a large dominant vessel with mild plaque in the proximal and mid vessel.  The tight LAD lesion could not be crossed at the time of catheterization.   The patient lives with his wife.  He reports progressive exertional fatigue and shortness of breath with low-level activity.  His wife reports that he sounds out of breath walking to the bathroom at night.  He has significantly decreased his activity level.  He denies any chest pain or pressure.  He has had some dizziness but no syncope.  He denies PND and orthopnea.  He has had mild lower extremity edema improved since being on diuretics.       Past Medical History:  Diagnosis Date   Aortic stenosis     Arthritis     Atrial  fibrillation (HCC)     BPH (benign prostatic hyperplasia)     Chronic low back pain     Depression     Diverticulitis     Elevated PSA     Foot drop, right     Gait disorder 02/03/2015   Hip pain     History of colonoscopy     History of nuclear stress test      Myoview 10/22: EF 67, no ischemia or infarction; low risk   HOH (hard of hearing)      hearing aid   Hypertension     Lumbar radiculopathy     Mitral valve prolapse     Onychomycosis     Peptic ulcer disease     Sepsis ([place]) 2005    e. coli-after prostate bx   Sleep apnea     Torn ACL     Tracheobronchomalacia      seen on 2019 Chest CT           Past Surgical History:  Procedure Laterality Date   BIOPSY   10/05/2021    Procedure: BIOPSY;  Surgeon: Ronnette Juniper, MD;  Location: [facility];  Service: Gastroenterology;;   CARDIOVERSION N/A 03/27/2020    Procedure: CARDIOVERSION;  Surgeon: Skeet Latch, MD;  Location: [facility];  Service: Cardiovascular;  Laterality: N/A;   CATARACT EXTRACTION  Bilateral     ESOPHAGOGASTRODUODENOSCOPY N/A 08/19/2015    Procedure: ESOPHAGOGASTRODUODENOSCOPY (EGD);  Surgeon: Laurence Spates, MD;  Location: [facility];  Service: Endoscopy;  Laterality: N/A;   ESOPHAGOGASTRODUODENOSCOPY N/A 10/05/2021    Procedure: ESOPHAGOGASTRODUODENOSCOPY (EGD);  Surgeon: Ronnette Juniper, MD;  Location: [facility];  Service: Gastroenterology;  Laterality: N/A;   right knee replacement Right 1996   RIGHT/LEFT HEART CATH AND CORONARY ANGIOGRAPHY N/A 05/26/2022    Procedure: RIGHT/LEFT HEART CATH AND CORONARY ANGIOGRAPHY;  Surgeon: Burnell Blanks, MD;  Location: [facility];  Service: Cardiovascular;  Laterality: N/A;   SHOULDER SURGERY       TEE WITHOUT CARDIOVERSION N/A 03/27/2020    Procedure: TRANSESOPHAGEAL ECHOCARDIOGRAM (TEE);  Surgeon: Skeet Latch, MD;  Location: [facility];  Service: Cardiovascular;  Laterality: N/A;   TONSILLECTOMY       TOTAL HIP ARTHROPLASTY Right  08/13/2019    Procedure: RIGHT TOTAL HIP ARTHROPLASTY ANTERIOR APPROACH;  Surgeon: Mcarthur Rossetti, MD;  Location: [facility];  Service: Orthopedics;  Laterality: Right;           Family History  Problem Relation Age of Onset   Pneumonia Mother 26   Cancer - Lung Father 17   Multiple myeloma Brother 24      Social History         Socioeconomic History   Marital status: Married      Spouse name: Not on file   Number of children: 2   Years of education: Not on file   Highest education level: Not on file  Occupational History   Occupation: Retired Social research officer, government  Tobacco Use   Smoking status: Former      Packs/day: 0.50      Types: Cigarettes      Start date: 1960      Quit date: 1961      Years since quitting: 62.7   Smokeless tobacco: Never   Tobacco comments:      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    Patient drinks 1 cup of coffee per day.    Social Determinants of Health    Financial Resource Strain: Not on file  Food Insecurity: Not on file  Transportation Needs: Not on file  Physical Activity: Not on file  Stress: Not on file  Social Connections: Not on file  Intimate Partner Violence: Not on file             Prior to Admission medications   Medication Sig Start Date End Date Taking? Authorizing Provider  allopurinol (ZYLOPRIM) 100 MG tablet Take 2 tablets (200 mg total) by mouth daily. 12/29/21   Yes CSherran Needs NP  apixaban (ELIQUIS) 5 MG TABS tablet Take 1 tablet (5 mg total) by mouth 2 (two) times daily. 05/03/22   Yes MBurnell Blanks MD  atorvastatin (LIPITOR)  10 MG tablet Take 10 mg by mouth every morning. 02/19/18   Yes [provider]  diltiazem (CARDIZEM CD) 240 MG  24 hr capsule TAKE (1) CAPSULE DAILY. 07/15/22   Yes Burnell Blanks, MD  dofetilide (TIKOSYN) 500 MCG capsule TAKE ONE CAPSULE BY MOUTH TWICE DAILY 07/07/22   Yes Sherran Needs, NP  dutasteride (AVODART) 0.5 MG capsule Take 0.5 mg by mouth every morning. 01/06/15   Yes [provider]  furosemide (LASIX) 40 MG tablet Take 60 mg by mouth 4 (four) times a week.     Yes [provider]  metoprolol tartrate (LOPRESSOR) 25 MG tablet Take 1 tablet (25 mg total) by mouth daily. 07/12/22   Yes Sherran Needs, NP  mirtazapine (REMERON) 30 MG tablet TAKE ONE TABLET AT BEDTIME. 08/03/20   Yes Lyndal Pulley, DO  Multiple Vitamin (MULTIVITAMIN) tablet Take 1 tablet by mouth daily.     Yes [provider]  Omega-3 Fatty Acids (FISH OIL) 1200 MG CAPS Take 1,200 mg by mouth daily.     Yes [provider]  pantoprazole (PROTONIX) 40 MG tablet Take 1 tablet (40 mg total) by mouth daily. 12/29/21 12/29/22 Yes Sherran Needs, NP  Polyethyl Glycol-Propyl Glycol (SYSTANE) 0.4-0.3 % SOLN Place 1 drop into both eyes 2 (two) times daily.     Yes [provider]  potassium chloride SA (KLOR-CON) 20 MEQ tablet TAKE 1 TABLET BY MOUTH IN THE MORNING, TAKE 1/2 TABLET BY MOUTH IN THE P.M. 08/10/21   Yes Weaver, [name] T, PA-C  Probiotic Product (PROBIOTIC DAILY PO) Take 1 tablet by mouth daily.     Yes [provider]  pyridOXINE (VITAMIN B-6) 100 MG tablet Take 100 mg by mouth daily.     Yes [provider]  Respiratory Therapy Supplies (FLUTTER) DEVI Use the flutter valve 10 times when doing it daily. 01/25/18   Yes Brand Males, MD  spironolactone (ALDACTONE) 25 MG tablet Take 0.5 tablets (12.5 mg total) by mouth daily. 07/12/22   Yes Sherran Needs, NP  tamsulosin (FLOMAX) 0.4 MG CAPS capsule Take 0.4 mg by mouth daily after breakfast.  01/06/15   Yes [provider]  Turmeric 500 MG CAPS Take 500 mg by mouth daily.     Yes [provider]  vitamin B-12 (CYANOCOBALAMIN) 1000 MCG tablet Take 1,000 mcg by mouth daily.       [provider]            Current Outpatient Medications  Medication Sig Dispense Refill   allopurinol (ZYLOPRIM) 100 MG tablet Take 2 tablets (200 mg total) by mouth daily.       apixaban (ELIQUIS) 5 MG TABS tablet Take 1 tablet (5 mg total) by mouth 2 (two) times daily. 180 tablet 2   atorvastatin (LIPITOR) 10 MG tablet Take 10 mg by mouth every morning.   10   diltiazem (CARDIZEM CD) 240 MG 24 hr capsule TAKE (1) CAPSULE DAILY. 30 capsule 9   dofetilide (TIKOSYN) 500 MCG capsule TAKE ONE CAPSULE BY MOUTH TWICE DAILY 180 capsule 1   dutasteride (AVODART) 0.5 MG capsule Take 0.5 mg by mouth every morning.   2   furosemide (LASIX) 40 MG tablet Take 60 mg by mouth 4 (four) times a week.       metoprolol tartrate (LOPRESSOR) 25 MG tablet Take 1 tablet (25 mg total) by mouth daily. 90 tablet 3   mirtazapine (REMERON) 30 MG tablet TAKE ONE TABLET AT BEDTIME. 90 tablet 0   Multiple Vitamin (MULTIVITAMIN) tablet Take 1 tablet by mouth daily.  Omega-3 Fatty Acids (FISH OIL) 1200 MG CAPS Take 1,200 mg by mouth daily.       pantoprazole (PROTONIX) 40 MG tablet Take 1 tablet (40 mg total) by mouth daily.       Polyethyl Glycol-Propyl Glycol (SYSTANE) 0.4-0.3 % SOLN Place 1 drop into both eyes 2 (two) times daily.       potassium chloride SA (KLOR-CON) 20 MEQ tablet TAKE 1 TABLET BY MOUTH IN THE MORNING, TAKE 1/2 TABLET BY MOUTH IN THE P.M. 135 tablet 3   Probiotic Product (PROBIOTIC DAILY PO) Take 1 tablet by mouth daily.       pyridOXINE (VITAMIN B-6) 100 MG tablet Take 100 mg by mouth daily.       Respiratory Therapy Supplies (FLUTTER) DEVI Use the flutter valve 10 times when doing it daily. 1 each 0   spironolactone (ALDACTONE) 25 MG tablet Take 0.5 tablets (12.5 mg total) by mouth  daily. 90 tablet 2   tamsulosin (FLOMAX) 0.4 MG CAPS capsule Take 0.4 mg by mouth daily after breakfast.   2   Turmeric 500 MG CAPS Take 500 mg by mouth daily.       vitamin B-12 (CYANOCOBALAMIN) 1000 MCG tablet Take 1,000 mcg by mouth daily.        No current facility-administered medications for this visit.      No Known Allergies       Review of Systems:               General:                      normal appetite, + decreased energy, no weight gain, no weight loss, no fever             Cardiac:                       no chest pain with exertion, no chest pain at rest, +SOB with mild exertion, no resting SOB, no PND, no orthopnea, no palpitations, + arrhythmia, + atrial fibrillation, + LE edema, + dizzy spells, no syncope             Respiratory:                 + exertional shortness of breath, no home oxygen, no productive cough, no dry cough, no bronchitis, no wheezing, no hemoptysis, no asthma, no pain with inspiration or cough, + sleep apnea, + CPAP at night             GI:                               no difficulty swallowing, no reflux, no frequent heartburn, no hiatal hernia, no abdominal pain, no constipation, no diarrhea, no hematochezia, no hematemesis, no melena             GU:                              no dysuria,  n frequency, ono urinary tract infection, no hematuria, no enlarged prostate, no kidney stones, no kidney disease             Vascular:                     no pain suggestive of claudication, no pain in feet, no  leg cramps, no varicose veins, no DVT, no non-healing foot ulcer             Neuro:                         no stroke, no TIA's, no seizures, no headaches, no temporary blindness one eye,  no slurred speech, + peripheral neuropathy, no chronic pain, no instability of gait, no memory/cognitive dysfunction             Musculoskeletal:         + arthritis - primarily involving the knees, no joint swelling, no myalgias, no difficulty walking, normal mobility               Skin:                            no rash, no itching, no skin infections, no pressure sores or ulcerations             Psych:                         no anxiety, no depression, no nervousness, no unusual recent stress             Eyes:                           no blurry vision, no floaters, no recent vision changes, + wears glasses            ENT:                            no hearing loss, no loose or painful teeth, no dentures, last saw dentist this year             Hematologic:               no easy bruising, no abnormal bleeding, no clotting disorder, no frequent epistaxis             Endocrine:                   no diabetes, does not check CBG's at home                            Physical Exam:               BP 112/69 (BP Location: Right Arm, Patient Position: Sitting)   Pulse 61   Resp 18   Ht 5' 10"  (1.778 m)   Wt 213 lb (96.6 kg)   SpO2 93% Comment: RA  BMI 30.56 kg/m              General:                      well-appearing             HEENT:                       Unremarkable, NCAT, PERLA, EOMI             Neck:                           no JVD,  no bruits, no adenopathy              Chest:                          clear to auscultation, symmetrical breath sounds, no wheezes, no rhonchi              CV:                              RRR, 3/6 systolic murmur RSB, no diastolic murmur             Abdomen:                    soft, non-tender, no masses              Extremities:                 warm, well-perfused, pulses palpable at ankles, no lower extremity edema             Rectal/GU                   Deferred             Neuro:                         Grossly non-focal and symmetrical throughout             Skin:                            Clean and dry, no rashes, no breakdown   Diagnostic Tests:   ECHOCARDIOGRAM REPORT         Patient Name:   Patrick Franco   Date of Exam: 05/04/2022  Medical Rec #:  [id number]     Height:       70.0 in  Accession #:    [id number]     Weight:       220.2 lb  Date of Birth:  [birth year]     BSA:          2.174 m  Patient Age:    51 years      BP:           126/70 mmHg  Patient Gender: M             HR:           72 bpm.  Exam Location:  [address]   Procedure: 2D Echo, Cardiac Doppler and Color Doppler   Indications:    I35.0 Aortic Stenosis     History:        Patient has prior history of Echocardiogram examinations,  most                  recent 07/16/2021. Mitral Valve Prolapse, Arrythmias:Atrial                  Fibrillation; Risk Factors:Hypertension and Sleep Apnea.     Sonographer:    Wilford Sports Rodgers-Jones RDCS  Referring Phys: [place]     1. Left ventricular ejection fraction, by estimation, is 60 to 65%. The  left ventricle has normal function. The left ventricle has no regional  wall motion abnormalities. There is mild concentric left ventricular  hypertrophy. Left  ventricular diastolic  parameters are consistent with Grade II diastolic dysfunction  (pseudonormalization). Elevated left ventricular end-diastolic pressure.   2. Right ventricular systolic function is normal. The right ventricular  size is normal. There is mildly elevated pulmonary artery systolic  pressure.   3. Left atrial size was severely dilated.   4. Right atrial size was mildly dilated.   5. The mitral valve is abnormal. Mild mitral valve regurgitation. Mild  mitral stenosis. There is moderate holosystolic prolapse of both leaflets  of the mitral valve. Severe mitral annular calcification.   6. The aortic valve is calcified. There is severe calcifcation of the  aortic valve. There is severe thickening of the aortic valve. Aortic valve  regurgitation is not visualized. Moderate to severe aortic valve stenosis.  Aortic valve area, by VTI  measures 1.00 cm. Aortic valve mean gradient measures 38.4 mmHg. Aortic  valve Vmax measures 3.95 m/s.   7. The inferior vena cava is normal in size with  greater than 50%  respiratory variability, suggesting right atrial pressure of 3 mmHg.   8. Cannot exclude a small PFO.   Comparison(s): Changes from prior study are noted. AoV mean gradient  increased to 39 mmHg, peak velocity increased to 3.94 m/s, consistent with  moderate to severe AS.   FINDINGS   Left Ventricle: Left ventricular ejection fraction, by estimation, is 60  to 65%. The left ventricle has normal function. The left ventricle has no  regional wall motion abnormalities. The left ventricular internal cavity  size was normal in size. There is   mild concentric left ventricular hypertrophy. Left ventricular diastolic  parameters are consistent with Grade II diastolic dysfunction  (pseudonormalization). Elevated left ventricular end-diastolic pressure.   Right Ventricle: The right ventricular size is normal. No increase in  right ventricular wall thickness. Right ventricular systolic function is  normal. There is mildly elevated pulmonary artery systolic pressure. The  tricuspid regurgitant velocity is 3.07   m/s, and with an assumed right atrial pressure of 3 mmHg, the estimated  right ventricular systolic pressure is 03.0 mmHg.   Left Atrium: Left atrial size was severely dilated.   Right Atrium: Right atrial size was mildly dilated.   Pericardium: There is no evidence of pericardial effusion.   Mitral Valve: The mitral valve is abnormal. There is moderate holosystolic  prolapse of both leaflets of the mitral valve. Severe mitral annular  calcification. Mild mitral valve regurgitation. Mild mitral valve  stenosis. MV peak gradient, 11.2 mmHg. The  mean mitral valve gradient is 4.0 mmHg.   Tricuspid Valve: The tricuspid valve is normal in structure. Tricuspid  valve regurgitation is trivial. No evidence of tricuspid stenosis.   Aortic Valve: The aortic valve is calcified. There is severe calcifcation  of the aortic valve. There is severe thickening of the aortic  valve.  Aortic valve regurgitation is not visualized. Moderate to severe aortic  stenosis is present. Aortic valve mean  gradient measures 38.4 mmHg. Aortic valve peak gradient measures 62.3  mmHg. Aortic valve area, by VTI measures 1.00 cm.   Pulmonic Valve: The pulmonic valve was not well visualized. Pulmonic valve  regurgitation is mild to moderate. No evidence of pulmonic stenosis.   Aorta: The aortic root, ascending aorta and aortic arch are all  structurally normal, with no evidence of dilitation or obstruction.   Venous: The inferior vena cava is normal in size with greater than 50%  respiratory variability, suggesting right atrial pressure of 3 mmHg.   IAS/Shunts: Cannot  exclude a small PFO.      LEFT VENTRICLE  PLAX 2D  LVIDd:         5.10 cm   Diastology  LVIDs:         2.70 cm   LV e' medial:    5.11 cm/s  LV PW:         1.20 cm   LV E/e' medial:  29.1  LV IVS:        1.20 cm   LV e' lateral:   5.66 cm/s  LVOT diam:     2.20 cm   LV E/e' lateral: 26.2  LV SV:         89  LV SV Index:   41  LVOT Area:     3.80 cm      RIGHT VENTRICLE             IVC  RV Basal diam:  3.03 cm     IVC diam: 1.30 cm  RV S prime:     18.50 cm/s  TAPSE (M-mode): 2.1 cm   LEFT ATRIUM             Index        RIGHT ATRIUM           Index  LA diam:        6.40 cm 2.94 cm/m   RA Area:     18.40 cm  LA Vol (A2C):   85.8 ml 39.46 ml/m  RA Volume:   54.60 ml  25.11 ml/m  LA Vol (A4C):   97.3 ml 44.75 ml/m  LA Biplane Vol: 93.4 ml 42.95 ml/m   AORTIC VALVE  AV Area (Vmax):    0.93 cm  AV Area (Vmean):   0.85 cm  AV Area (VTI):     1.00 cm  AV Vmax:           394.60 cm/s  AV Vmean:          288.000 cm/s  AV VTI:            0.891 m  AV Peak Grad:      62.3 mmHg  AV Mean Grad:      38.4 mmHg  LVOT Vmax:         96.75 cm/s  LVOT Vmean:        64.700 cm/s  LVOT VTI:          0.234 m  LVOT/AV VTI ratio: 0.26     AORTA  Ao Root diam: 4.00 cm  Ao Asc diam:  3.70 cm   MITRAL  VALVE                TRICUSPID VALVE  MV Area (PHT): 3.95 cm     TR Peak grad:   37.7 mmHg  MV Area VTI:   1.62 cm     TR Vmax:        307.00 cm/s  MV Peak grad:  11.2 mmHg  MV Mean grad:  4.0 mmHg     SHUNTS  MV Vmax:       1.67 m/s     Systemic VTI:  0.23 m  MV Vmean:      92.3 cm/s    Systemic Diam: 2.20 cm  MV Decel Time: 192 msec  MR Peak grad: 130.8 mmHg  MR Mean grad: 88.8 mmHg  MR Vmax:      571.75 cm/s  MR Vmean:     450.3 cm/s  MV E velocity: 148.50 cm/s  MV A velocity: 118.00 cm/s  MV E/A ratio:  1.26   Buford Dresser MD  Electronically signed by Buford Dresser MD  Signature Date/Time: 05/04/2022/2:50:41 PM         Final       Physicians   Panel Physicians Referring Physician Case Authorizing Physician  Burnell Blanks, MD (Primary)        Procedures   RIGHT/LEFT HEART CATH AND CORONARY ANGIOGRAPHY    Conclusion       Ost RCA to Mid RCA lesion is 20% stenosed.   Mid Cx lesion is 40% stenosed.   Dist LAD lesion is 99% stenosed.   Prox LAD to Mid LAD lesion is 30% stenosed.   The LAD has diffuse mild to moderate proximal and mid stenosis. There is a severe, heavily calcified stenosis in the mid to distal LAD. There appears to be a completely occluded branch just beyond this stenosis and then the LAD continues to the apex.  The Circumflex has a moderate mid stenosis The RCA is a large dominant vessel with heavy calcification throughout. Mild plaque in the proximal and mid vessel.    Recommendations: His mid to distal LAD lesion is heavily calcified. I was unable to cross the lesion today. I think we should continue with workup for TAVR. Will treat his CAD medically for now. He has no angina. If we complete his TAVR and he continues to have dyspnea on exertion, can then consider complex PCI of the mid to distal LAD with orbital atherectomy and stenting.    Indications   Severe aortic stenosis [I35.0 (ICD-10-CM)]  Coronary artery disease  involving native coronary artery of native heart without angina pectoris [I25.10 (ICD-10-CM)]    Procedural Details   Technical Details Indication: 88 yo male with severe aortic stenosis with recent worsening dyspnea on exertion.   Procedure: The risks, benefits, complications, treatment options, and expected outcomes were discussed with the patient. The patient and/or family concurred with the proposed plan, giving informed consent. The patient was brought to the cath lab after IV hydration was given. The patient was sedated with Versed and Fentanyl. The IV catheter in the right antecubital vein was changed for a 5 [place] sheath. Right heart catheterization was performed with a balloon tipped catheter. I was unable to pass the catheter into the RV despite having a wire out in the PA. RA pressure measured. The right wrist was prepped and draped in a sterile fashion. 1% lidocaine was used for local anesthesia. Using the modified Seldinger access technique, a 5 French sheath was placed in the right radial artery. 3 mg Verapamil was given through the sheath. Weight based IV heparin was given. Standard diagnostic catheters were used to perform selective coronary angiography. I did not cross the aortic valve. All catheter exchanges were performed over an exchange length guidewire.   PCI Note: I attempted PCI of the mid to distal LAD stenosis. Plavix 600 mg po x 1.  I engaged the left main with a XB LAD 3.5 guiding catheter and passed a Cougar IC wire down the LAD. I was unable to pass a balloon beyond the severe, heavily calcified mid LAD stenosis. A Telescope guide extension catheter was placed into the left main for better support but I was still unable to pass the balloon into the distal LAD beyond the severe stenosis. The PCI was aborted.   The sheath was removed from the right radial artery and a hemostasis band  was applied at the arteriotomy site on the right wrist.      Estimated blood loss <50 mL.    During this procedure medications were administered to achieve and maintain moderate conscious sedation while the patient's heart rate, blood pressure, and oxygen saturation were continuously monitored and I was present face-to-face 100% of this time.    Medications (Filter: Administrations occurring from 0820 to 0940 on 05/26/22) Heparin (Porcine) in NaCl 1000-0.9 UT/500ML-% SOLN (mL) Total volume:  1,000 mL  Date/Time Rate/Dose/Volume Action    05/26/22 0824 500 mL Given    0824 500 mL Given      fentaNYL (SUBLIMAZE) injection (mcg) Total dose:  50 mcg  Date/Time Rate/Dose/Volume Action    05/26/22 0832 25 mcg Given    0912 25 mcg Given      midazolam (VERSED) injection (mg) Total dose:  2 mg  Date/Time Rate/Dose/Volume Action    05/26/22 0832 1 mg Given    0912 1 mg Given      lidocaine (PF) (XYLOCAINE) 1 % injection (mL) Total volume:  5 mL  Date/Time Rate/Dose/Volume Action    05/26/22 0842 5 mL Given      Radial Cocktail/Verapamil only (mL) Total volume:  10 mL  Date/Time Rate/Dose/Volume Action    05/26/22 0843 10 mL Given      heparin sodium (porcine) injection (Units) Total dose:  13,000 Units  Date/Time Rate/Dose/Volume Action    05/26/22 0904 10,000 Units Given    0919 3,000 Units Given      clopidogrel (PLAVIX) tablet (mg) Total dose:  600 mg  Date/Time Rate/Dose/Volume Action    05/26/22 0905 600 mg Given      famotidine (PEPCID) IVPB 20 mg premix (mg) Total dose:  20 mg  Date/Time Rate/Dose/Volume Action    05/26/22 0911 20 mg New Bag/Given      iohexol (OMNIPAQUE) 350 MG/ML injection (mL) Total volume:  120 mL  Date/Time Rate/Dose/Volume Action    05/26/22 0929 120 mL Given      Sedation Time   Sedation Time Physician-1: 49 minutes 11 seconds Contrast   Medication Name Total Dose  iohexol (OMNIPAQUE) 350 MG/ML injection 120 mL    Radiation/Fluoro   Fluoro time: 14.8 (min) DAP: 25336 (mGycm2) Cumulative Air Kerma: 350  (mGy) Complications      Complications documented before study signed (05/26/2022  0:93 AM)     No complications were associated with this study.  Documented by Renne Musca, RT - 05/26/2022  9:23 AM      Coronary Findings   Diagnostic Dominance: Right Left Anterior Descending  Vessel is large.  Prox LAD to Mid LAD lesion is 30% stenosed.  Dist LAD lesion is 99% stenosed. The lesion is calcified.    Left Circumflex  Vessel is large.  Mid Cx lesion is 40% stenosed. The lesion is calcified.    Right Coronary Artery  Vessel is large.  Ost RCA to Mid RCA lesion is 20% stenosed. The lesion is calcified.    Intervention    No interventions have been documented.    Coronary Diagrams   Diagnostic Dominance: Right  Intervention   Implants      No implant documentation for this case.    Syngo Images    Show images for CARDIAC CATHETERIZATION Images on Long Term Storage    Show images for Kaydan, Wong to Procedure Log   Procedure Log    Hemo Data   Flowsheet Row Most Recent Value  RA  A Wave 5 mmHg  RA V Wave 1 mmHg  RA Mean 0 mmHg  AO Systolic Pressure 301 mmHg  AO Diastolic Pressure 73 mmHg  AO Mean 99 mmHg      ADDENDUM REPORT: 06/07/2022 18:02   CLINICAL DATA:  Severe Aortic Stenosis.   EXAM: Cardiac TAVR CT   TECHNIQUE: A non-contrast, gated CT scan was obtained with axial slices of 3 mm through the heart for aortic valve calcium scoring. A 120 kV retrospective, gated, contrast cardiac scan was obtained. Gantry rotation speed was 250 msecs and collimation was 0.6 mm. Nitroglycerin was not given. The 3D data set was reconstructed in 5% intervals of the 0-95% of the R-R cycle. Systolic and diastolic phases were analyzed on a dedicated workstation using MPR, MIP, and VRT modes. The patient received 100 cc of contrast.   FINDINGS: Image quality: Excellent.   Noise artifact is: Limited.   Valve Morphology: Tricuspid aortic valve with severe  diffuse calcifications. Restricted movement in systole.   Aortic Valve Calcium score: 4409   Aortic annular dimension:   Phase assessed: 15%   Annular area: 521 mm2   Annular perimeter: 83.1 mm   Max diameter: 29.6 mm   Min diameter: 21.9 mm   Annular and subannular calcification: Moderate protruding calcium under the [place].   Membranous septum length: 11.2 mm   Optimal coplanar projection: LAO 37 CRA 8   Coronary Artery Height above Annulus:   Left Main: 21.8 mm   Right Coronary: 19.1 mm   Sinus of Valsalva Measurements:   Non-coronary: 36 mm   Right-coronary: 35 mm   Left-coronary: 37 mm   Sinus of Valsalva Height:   Non-coronary: 24.9 mm   Right-coronary: 25.2 mm   Left-coronary: 30.2 mm   Sinotubular Junction: 33 mm   Ascending Thoracic Aorta: 34 mm   Coronary Arteries: Normal coronary origin. Right dominance. The study was performed without use of NTG and is insufficient for plaque evaluation. Please refer to recent cardiac catheterization for coronary assessment. 3-vessel coronary calcifications.   Cardiac Morphology:   Right Atrium: Right atrial size is within normal limits.   Right Ventricle: The right ventricular cavity is within normal limits.   Left Atrium: Left atrial size is normal in size with no left atrial appendage filling defect.   Left Ventricle: The ventricular cavity size is within normal limits.   Pulmonary arteries: Dilated suggestive of pulmonary hypertension.   Pulmonary veins: Normal pulmonary venous drainage.   Pericardium: Normal thickness with no significant effusion or calcium present.   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   Extra-cardiac findings: See attached radiology report for non-cardiac structures.   IMPRESSION: 1. Tricuspid aortic valve with severe diffuse calcifications.   2. Annular measurements appropriate for a 26 mm S3 (521 mm2).   3.  Moderate protruding calcium under the [place].   4. Sufficient coronary to annulus distance.   5. Optimal Fluoroscopic Angle for Delivery: LAO 37 CRA 8   6. Dilated pulmonary artery suggestive of pulmonary hypertension.   [place] T. Audie Box, MD     Electronically Signed   By: Eleonore Chiquito M.D.   On: 06/07/2022 18:02    Addended by Geralynn Rile, MD on 06/07/2022  6:04 PM    Study Result   Narrative & Impression  EXAM: OVER-READ INTERPRETATION  CT CHEST   The following report is a limited chest CT over-read performed by radiologist Dr. Suzy Bouchard of [facility] on  06/07/2022. The CTA/cardiac morphology interpretation by the cardiologist is attached.   COMPARISON:  None Available.   FINDINGS: Limited view of the lung parenchyma demonstrates mild LEFT lingular atelectasis. Airways are normal.   Limited view of the mediastinum demonstrates no adenopathy. Esophagus normal.   Limited view of the upper abdomen unremarkable. Benign central hepatic cyst   Limited view of the skeleton and chest wall is unremarkable. Scoliosis of the spine.   IMPRESSION: No significant extracardiac findings.   Electronically Signed: By: Suzy Bouchard M.D. On: 06/07/2022 16:33        Narrative & Impression  CLINICAL DATA:  88 year old male history of severe aortic stenosis. Preprocedural study prior to potential transcatheter aortic valve replacement (TAVR) procedure.   EXAM: CT ANGIOGRAPHY CHEST, ABDOMEN AND PELVIS   TECHNIQUE: Multidetector CT imaging through the chest, abdomen and pelvis was performed using the standard protocol during bolus administration of intravenous contrast. Multiplanar reconstructed images and MIPs were obtained and reviewed to evaluate the vascular anatomy.   RADIATION DOSE REDUCTION: This exam was performed according to the departmental dose-optimization program which includes automated exposure control, adjustment of the mA  and/or kV according to patient size and/or use of iterative reconstruction technique.   CONTRAST:  135m OMNIPAQUE IOHEXOL 350 MG/ML SOLN   COMPARISON:  Chest CT 08/11/2020. No prior CT the abdomen and pelvis.   FINDINGS: CTA CHEST FINDINGS   Cardiovascular: Heart size is mildly enlarged with left atrial dilatation. There is no significant pericardial fluid, thickening or pericardial calcification. There is aortic atherosclerosis, as well as atherosclerosis of the great vessels of the mediastinum and the coronary arteries, including calcified atherosclerotic plaque in the left main, left anterior descending, left circumflex and right coronary arteries. Thickening and calcification of the aortic valve. Severe calcifications of the mitral annulus. Dilatation of the pulmonic trunk (4.1 cm in diameter).   Mediastinum/Lymph Nodes: No pathologically enlarged mediastinal or hilar lymph nodes. Esophagus is unremarkable in appearance. No axillary lymphadenopathy. Heterogeneously enhancing nodule in the left lobe of the thyroid gland extending into the isthmus (axial image 36 of series 4) measuring 3.7 x 3.3 cm, similar to prior examinations, previously biopsied (no further imaging is recommended).   Lungs/Pleura: No acute consolidative airspace disease. No pleural effusions. Bibasilar areas of scarring and/or atelectasis are noted in the lungs. No definite suspicious appearing pulmonary nodules or masses are noted.   Musculoskeletal/Soft Tissues: There are no aggressive appearing lytic or blastic lesions noted in the visualized portions of the skeleton. S shaped scoliosis of the thoracolumbar spine convex to the left superiorly and to the right inferiorly in the thoracic region.   CTA ABDOMEN AND PELVIS FINDINGS   Hepatobiliary: Diffuse low attenuation throughout the hepatic parenchyma, indicative of a background of hepatic steatosis. 2.3 cm low-attenuation lesion in segment 4A of  the liver, compatible with a simple cyst. No aggressive appearing hepatic lesions. No intra or extrahepatic biliary ductal dilatation. Gallbladder is unremarkable in appearance.   Pancreas: No pancreatic mass. No pancreatic ductal dilatation. No pancreatic or peripancreatic fluid collections or inflammatory changes.   Spleen: Unremarkable.   Adrenals/Urinary Tract: Right kidney and bilateral adrenal glands are normal in appearance. 2.1 cm low-attenuation lesion in the interpolar region of the left kidney, compatible with a simple cyst (no imaging follow-up is recommended). No hydroureteronephrosis. Urinary bladder wall appears mildly thickened with multiple small trabeculations and 8 mm diverticulum on the right side.   Stomach/Bowel: Stomach is unremarkable in appearance. No pathologic dilatation of small bowel  or colon. Numerous colonic diverticulae are noted, particularly in the sigmoid colon without definite focal surrounding inflammatory changes to clearly indicate an acute diverticulitis at this time. Normal appendix.   Vascular/Lymphatic: Aortic atherosclerosis, without evidence of aneurysm or dissection in the abdominal or pelvic vasculature. Vascular findings and measurements pertinent to potential TAVR procedure, as detailed below. No lymphadenopathy noted in the abdomen or pelvis.   Reproductive: Prostate gland and seminal vesicles are largely obscured by beam hardening artifact from the patient's right hip arthroplasty, but visualized portions are unremarkable.   Other: Small umbilical hernia containing fat and a small volume of fluid. No other significant volume of ascites. No pneumoperitoneum.   Musculoskeletal: Status post right hip arthroplasty. There are no aggressive appearing lytic or blastic lesions noted in the visualized portions of the skeleton. Levoscoliosis in the lumbar spine.   VASCULAR MEASUREMENTS PERTINENT TO TAVR:   AORTA:   Minimal Aortic  Diameter-17 x 16 mm   Severity of Aortic Calcification-mild   RIGHT PELVIS:   Right Common Iliac Artery -   Minimal Diameter-11.6 x 10.9 mm   Tortuosity-mild   Calcification-moderate   Right External Iliac Artery -   Minimal Diameter-9.2 x 9.4 mm   Tortuosity-severe   Calcification-mild   Right Common Femoral Artery -   Minimal Diameter-9.8 x 9.9 mm   Tortuosity-mild   Calcification-mild   LEFT PELVIS:   Left Common Iliac Artery -   Minimal Diameter-13.0 x 11.1 mm   Tortuosity-moderate   Calcification-moderate   Left External Iliac Artery -   Minimal Diameter-9.9 x 10.5 mm   Tortuosity-severe   Calcification-none   Left Common Femoral Artery -   Minimal Diameter-9.1 x 9.9 mm   Tortuosity-mild   Calcification-mild   Review of the MIP images confirms the above findings.   IMPRESSION: 1. Vascular findings and measurements pertinent to potential TAVR procedure, as detailed above. 2. Severe thickening calcification of the aortic valve, compatible with reported clinical history of severe aortic stenosis. 3. Cardiomegaly with left atrial dilatation. 4. There are calcifications of the mitral annulus. Echocardiographic correlation for evaluation of potential valvular dysfunction may be warranted if clinically indicated. 5. Aortic atherosclerosis, in addition to left main and three-vessel coronary artery disease. 6. Dilatation of the pulmonic trunk (4.1 cm in diameter), concerning for pulmonary arterial hypertension. 7. Hepatic steatosis. 8. Severe colonic diverticulosis without evidence of acute diverticulitis at this time. 9. Umbilical hernia containing omental fat and a small volume of fluid. No associated bowel incarceration or obstruction at this time. 10. Additional incidental findings, as above.     Electronically Signed   By: Vinnie Langton M.D.   On: 06/08/2022 08:46      Impression:   This 88 year old gentleman has stage D,  severe, symptomatic aortic stenosis with New York Heart Association class II symptoms of exertional fatigue and shortness of breath consistent with chronic diastolic congestive heart failure.  I have personally reviewed his 2D echocardiogram, cardiac catheterization, and CTA studies.  His echocardiogram shows a severely calcified and thickened aortic valve with restricted leaflet mobility. The mean gradient has increased to 38.4 mmHg with a peak gradient of 62 mmHg and a valve area 1.0 cm by VTI consistent with severe aortic stenosis.  There is severe mitral annular calcification with mild mitral valve regurgitation.  Left ventricular ejection fraction is 60 to 65% with mild concentric LVH and grade 2 diastolic dysfunction.  Cardiac catheterization shows a 99% mid to distal LAD stenosis which is heavily calcified and  could not be crossed at the time of catheterization.  The patient denies any chest pain or pressure.  I agree that aortic valve replacement is indicated for relief of his progressive symptoms and to prevent left ventricular dysfunction.  Given his advanced age I think that transcatheter aortic valve replacement would be the best treatment for him.  I think his LAD stenosis can be treated medically for now and if he develops any chest discomfort symptoms after TAVR or has persistent exertional shortness of breath and I think atherectomy and PCI could be entertained.  His gated cardiac CTA shows anatomy suitable for TAVR using a SAPIEN 3 valve.  His valve appears to be trileaflet with some protruding calcium under the noncoronary cusp.  His abdominal and pelvic CTA shows adequate pelvic vascular anatomy to allow transfemoral insertion.   The patient and his wife were counseled at length regarding treatment alternatives for management of severe symptomatic aortic stenosis. The risks and benefits of surgical intervention has been discussed in detail. Long-term prognosis with medical therapy was  discussed. Alternative approaches such as conventional surgical aortic valve replacement, transcatheter aortic valve replacement, and palliative medical therapy were compared and contrasted at length. This discussion was placed in the context of the patient's own specific clinical presentation and past medical history. All of their questions have been addressed.    Following the decision to proceed with transcatheter aortic valve replacement, a discussion was held regarding what types of management strategies would be attempted intraoperatively in the event of life-threatening complications, including whether or not the patient would be considered a candidate for the use of cardiopulmonary bypass and/or conversion to open sternotomy for attempted surgical intervention.  He is in relatively good shape for 88 years old and I think he would be a candidate for emergency sternotomy to manage any intraoperative complications.  The patient is aware of the fact that transient use of cardiopulmonary bypass may be necessary. The patient has been advised of a variety of complications that might develop including but not limited to risks of death, stroke, paravalvular leak, aortic dissection or other major vascular complications, aortic annulus rupture, device embolization, cardiac rupture or perforation, mitral regurgitation, acute myocardial infarction, arrhythmia, heart block or bradycardia requiring permanent pacemaker placement, congestive heart failure, respiratory failure, renal failure, pneumonia, infection, other late complications related to structural valve deterioration or migration, or other complications that might ultimately cause a temporary or permanent loss of functional independence or other long term morbidity. The patient provides full informed consent for the procedure as described and all questions were answered.       Plan:   Transfemoral TAVR using a SAPIEN 3 valve.    Gaye Pollack,  MD

## 2022-07-26 ENCOUNTER — Other Ambulatory Visit: Payer: Self-pay

## 2022-07-26 ENCOUNTER — Encounter (HOSPITAL_COMMUNITY): Payer: Self-pay | Admitting: Cardiovascular Disease

## 2022-07-26 ENCOUNTER — Inpatient Hospital Stay (HOSPITAL_COMMUNITY): Payer: Medicare Other

## 2022-07-26 ENCOUNTER — Other Ambulatory Visit (HOSPITAL_COMMUNITY): Payer: Self-pay | Admitting: *Deleted

## 2022-07-26 ENCOUNTER — Inpatient Hospital Stay (HOSPITAL_COMMUNITY): Payer: Medicare Other | Admitting: Physician Assistant

## 2022-07-26 ENCOUNTER — Other Ambulatory Visit: Payer: Self-pay | Admitting: Cardiology

## 2022-07-26 ENCOUNTER — Inpatient Hospital Stay (HOSPITAL_COMMUNITY)
Admission: RE | Admit: 2022-07-26 | Discharge: 2022-07-27 | DRG: 267 | Disposition: A | Discharge status: Home / Self Care | Payer: Medicare Other | Attending: Cardiovascular Disease | Admitting: Cardiovascular Disease

## 2022-07-26 ENCOUNTER — Encounter (HOSPITAL_COMMUNITY)
Admission: RE | Disposition: A | Discharge status: Home / Self Care | Payer: Self-pay | Source: Home / Self Care | Attending: Cardiovascular Disease

## 2022-07-26 DIAGNOSIS — Z96651 Presence of right artificial knee joint: Secondary | ICD-10-CM | POA: Diagnosis present

## 2022-07-26 DIAGNOSIS — Z952 Presence of prosthetic heart valve: Secondary | ICD-10-CM

## 2022-07-26 DIAGNOSIS — K760 Fatty (change of) liver, not elsewhere classified: Secondary | ICD-10-CM | POA: Diagnosis not present

## 2022-07-26 DIAGNOSIS — I480 Paroxysmal atrial fibrillation: Secondary | ICD-10-CM | POA: Diagnosis present

## 2022-07-26 DIAGNOSIS — G8929 Other chronic pain: Secondary | ICD-10-CM | POA: Diagnosis present

## 2022-07-26 DIAGNOSIS — Z7901 Long term (current) use of anticoagulants: Secondary | ICD-10-CM | POA: Diagnosis not present

## 2022-07-26 DIAGNOSIS — I10 Essential (primary) hypertension: Secondary | ICD-10-CM

## 2022-07-26 DIAGNOSIS — I5030 Unspecified diastolic (congestive) heart failure: Secondary | ICD-10-CM | POA: Diagnosis present

## 2022-07-26 DIAGNOSIS — Z79899 Other long term (current) drug therapy: Secondary | ICD-10-CM

## 2022-07-26 DIAGNOSIS — K5730 Diverticulosis of large intestine without perforation or abscess without bleeding: Secondary | ICD-10-CM | POA: Diagnosis present

## 2022-07-26 DIAGNOSIS — F32A Depression, unspecified: Secondary | ICD-10-CM | POA: Diagnosis present

## 2022-07-26 DIAGNOSIS — Z8711 Personal history of peptic ulcer disease: Secondary | ICD-10-CM

## 2022-07-26 DIAGNOSIS — H9190 Unspecified hearing loss, unspecified ear: Secondary | ICD-10-CM | POA: Diagnosis present

## 2022-07-26 DIAGNOSIS — I5032 Chronic diastolic (congestive) heart failure: Secondary | ICD-10-CM | POA: Diagnosis present

## 2022-07-26 DIAGNOSIS — Z87891 Personal history of nicotine dependence: Secondary | ICD-10-CM | POA: Diagnosis not present

## 2022-07-26 DIAGNOSIS — K429 Umbilical hernia without obstruction or gangrene: Secondary | ICD-10-CM | POA: Diagnosis present

## 2022-07-26 DIAGNOSIS — M21371 Foot drop, right foot: Secondary | ICD-10-CM | POA: Diagnosis present

## 2022-07-26 DIAGNOSIS — G4733 Obstructive sleep apnea (adult) (pediatric): Secondary | ICD-10-CM | POA: Diagnosis present

## 2022-07-26 DIAGNOSIS — I110 Hypertensive heart disease with heart failure: Secondary | ICD-10-CM | POA: Diagnosis present

## 2022-07-26 DIAGNOSIS — I9581 Postprocedural hypotension: Secondary | ICD-10-CM | POA: Diagnosis not present

## 2022-07-26 DIAGNOSIS — N400 Enlarged prostate without lower urinary tract symptoms: Secondary | ICD-10-CM | POA: Diagnosis present

## 2022-07-26 DIAGNOSIS — I2510 Atherosclerotic heart disease of native coronary artery without angina pectoris: Secondary | ICD-10-CM | POA: Diagnosis present

## 2022-07-26 DIAGNOSIS — I350 Nonrheumatic aortic (valve) stenosis: Principal | ICD-10-CM

## 2022-07-26 DIAGNOSIS — G4730 Sleep apnea, unspecified: Secondary | ICD-10-CM | POA: Diagnosis present

## 2022-07-26 DIAGNOSIS — Z006 Encounter for examination for normal comparison and control in clinical research program: Secondary | ICD-10-CM

## 2022-07-26 DIAGNOSIS — Z974 Presence of external hearing-aid: Secondary | ICD-10-CM

## 2022-07-26 DIAGNOSIS — M5416 Radiculopathy, lumbar region: Secondary | ICD-10-CM | POA: Diagnosis present

## 2022-07-26 DIAGNOSIS — Z96641 Presence of right artificial hip joint: Secondary | ICD-10-CM | POA: Diagnosis present

## 2022-07-26 DIAGNOSIS — M1990 Unspecified osteoarthritis, unspecified site: Secondary | ICD-10-CM | POA: Diagnosis present

## 2022-07-26 DIAGNOSIS — I341 Nonrheumatic mitral (valve) prolapse: Secondary | ICD-10-CM | POA: Diagnosis not present

## 2022-07-26 HISTORY — DX: Presence of prosthetic heart valve: Z95.2

## 2022-07-26 HISTORY — PX: INTRAOPERATIVE TRANSTHORACIC ECHOCARDIOGRAM: SHX6523

## 2022-07-26 HISTORY — PX: TRANSCATHETER AORTIC VALVE REPLACEMENT, TRANSFEMORAL: SHX6400

## 2022-07-26 LAB — POCT I-STAT, CHEM 8
BUN: 21 mg/dL (ref 8–23)
BUN: 22 mg/dL (ref 8–23)
BUN: 13 mg/dL (ref 8–23)
BUN: 21 mg/dL (ref 8–23)
Calcium, Ion: 1.2 mmol/L (ref 1.15–1.40)
Calcium, Ion: 1.23 mmol/L (ref 1.15–1.40)
Calcium, Ion: 0.88 mmol/L — CL (ref 1.15–1.40)
Calcium, Ion: 1.12 mmol/L — ABNORMAL LOW (ref 1.15–1.40)
Chloride: 107 mmol/L (ref 98–111)
Chloride: 106 mmol/L (ref 98–111)
Chloride: 107 mmol/L (ref 98–111)
Chloride: 117 mmol/L — ABNORMAL HIGH (ref 98–111)
Creatinine, Ser: 0.7 mg/dL (ref 0.61–1.24)
Creatinine, Ser: 0.8 mg/dL (ref 0.61–1.24)
Creatinine, Ser: 0.3 mg/dL — ABNORMAL LOW (ref 0.61–1.24)
Creatinine, Ser: 0.7 mg/dL (ref 0.61–1.24)
Glucose, Bld: 101 mg/dL — ABNORMAL HIGH (ref 70–99)
Glucose, Bld: 134 mg/dL — ABNORMAL HIGH (ref 70–99)
Glucose, Bld: 70 mg/dL (ref 70–99)
Glucose, Bld: 98 mg/dL (ref 70–99)
HCT: 41 % (ref 39.0–52.0)
HCT: 41 % (ref 39.0–52.0)
HCT: 29 % — ABNORMAL LOW (ref 39.0–52.0)
HCT: 40 % (ref 39.0–52.0)
Hemoglobin: 13.9 g/dL (ref 13.0–17.0)
Hemoglobin: 13.9 g/dL (ref 13.0–17.0)
Hemoglobin: 13.6 g/dL (ref 13.0–17.0)
Hemoglobin: 9.9 g/dL — ABNORMAL LOW (ref 13.0–17.0)
Potassium: 3.8 mmol/L (ref 3.5–5.1)
Potassium: 3.6 mmol/L (ref 3.5–5.1)
Potassium: 2.2 mmol/L — CL (ref 3.5–5.1)
Potassium: 3.5 mmol/L (ref 3.5–5.1)
Sodium: 142 mmol/L (ref 135–145)
Sodium: 143 mmol/L (ref 135–145)
Sodium: 142 mmol/L (ref 135–145)
Sodium: 149 mmol/L — ABNORMAL HIGH (ref 135–145)
TCO2: 23 mmol/L (ref 22–32)
TCO2: 23 mmol/L (ref 22–32)
TCO2: 15 mmol/L — ABNORMAL LOW (ref 22–32)
TCO2: 20 mmol/L — ABNORMAL LOW (ref 22–32)

## 2022-07-26 LAB — ECHOCARDIOGRAM LIMITED
AR max vel: 3.39 cm2
AV Area VTI: 3.6 cm2
AV Area mean vel: 3.22 cm2
AV Mean grad: 2 mmHg
AV Peak grad: 4 mmHg
Ao pk vel: 1 m/s
S' Lateral: 3.2 cm

## 2022-07-26 LAB — POCT ACTIVATED CLOTTING TIME: Activated Clotting Time: 125 seconds

## 2022-07-26 LAB — GLUCOSE, CAPILLARY: Glucose-Capillary: 94 mg/dL (ref 70–99)

## 2022-07-26 SURGERY — IMPLANTATION, AORTIC VALVE, TRANSCATHETER, FEMORAL APPROACH
Anesthesia: Monitor Anesthesia Care | Site: Chest

## 2022-07-26 MED ORDER — DILTIAZEM HCL ER COATED BEADS 240 MG PO CP24
240.0000 mg | ORAL_CAPSULE | Freq: Every day | ORAL | Status: DC
  Administered 2022-07-27: 240 mg via ORAL
  Filled 2022-07-26: qty 1

## 2022-07-26 MED ORDER — ACETAMINOPHEN 325 MG PO TABS: 650.0000 mg | ORAL_TABLET | Freq: Four times a day (QID) | ORAL | Status: DC | PRN

## 2022-07-26 MED ORDER — SODIUM CHLORIDE 0.9% FLUSH
3.0000 mL | Freq: Two times a day (BID) | INTRAVENOUS | Status: DC
  Administered 2022-07-27: 3 mL via INTRAVENOUS

## 2022-07-26 MED ORDER — SODIUM CHLORIDE 0.9% FLUSH: 3.0000 mL | INTRAVENOUS | Status: DC | PRN

## 2022-07-26 MED ORDER — NITROGLYCERIN IN D5W 200-5 MCG/ML-% IV SOLN
0.0000 ug/min | INTRAVENOUS | Status: DC
  Filled 2022-07-26: qty 250

## 2022-07-26 MED ORDER — PANTOPRAZOLE SODIUM 40 MG PO TBEC
40.0000 mg | DELAYED_RELEASE_TABLET | Freq: Every day | ORAL | Status: DC
  Administered 2022-07-26 – 2022-07-27 (×2): 40 mg via ORAL
  Filled 2022-07-26 (×2): qty 1

## 2022-07-26 MED ORDER — SPIRONOLACTONE 12.5 MG HALF TABLET
12.5000 mg | ORAL_TABLET | Freq: Every day | ORAL | Status: DC
  Administered 2022-07-27: 12.5 mg via ORAL
  Filled 2022-07-26: qty 1

## 2022-07-26 MED ORDER — SODIUM CHLORIDE 0.9 % IV SOLN: INTRAVENOUS | Status: DC

## 2022-07-26 MED ORDER — PROTAMINE SULFATE 10 MG/ML IV SOLN
INTRAVENOUS | Status: DC | PRN
  Administered 2022-07-26: 140 mg via INTRAVENOUS

## 2022-07-26 MED ORDER — ONDANSETRON HCL 4 MG/2ML IJ SOLN
INTRAMUSCULAR | Status: AC
  Filled 2022-07-26: qty 4

## 2022-07-26 MED ORDER — MIRTAZAPINE 15 MG PO TABS
30.0000 mg | ORAL_TABLET | Freq: Every day | ORAL | Status: DC
  Administered 2022-07-26: 30 mg via ORAL
  Filled 2022-07-26: qty 2

## 2022-07-26 MED ORDER — PHENYLEPHRINE HCL-NACL 20-0.9 MG/250ML-% IV SOLN
INTRAVENOUS | Status: DC | PRN
  Administered 2022-07-26: 40 ug/min via INTRAVENOUS

## 2022-07-26 MED ORDER — CHLORHEXIDINE GLUCONATE 0.12 % MT SOLN
15.0000 mL | Freq: Once | OROMUCOSAL | Status: AC
  Administered 2022-07-26: 15 mL via OROMUCOSAL

## 2022-07-26 MED ORDER — LIDOCAINE 2% (20 MG/ML) 5 ML SYRINGE
INTRAMUSCULAR | Status: DC | PRN
  Administered 2022-07-26: 100 mg via INTRAVENOUS

## 2022-07-26 MED ORDER — IODIXANOL 320 MG/ML IV SOLN
INTRAVENOUS | Status: DC | PRN
  Administered 2022-07-26: 100 mL

## 2022-07-26 MED ORDER — ACETAMINOPHEN 650 MG RE SUPP: 650.0000 mg | Freq: Four times a day (QID) | RECTAL | Status: DC | PRN

## 2022-07-26 MED ORDER — ONDANSETRON HCL 4 MG/2ML IJ SOLN: 4.0000 mg | Freq: Four times a day (QID) | INTRAMUSCULAR | Status: DC | PRN

## 2022-07-26 MED ORDER — CEFAZOLIN SODIUM-DEXTROSE 2-4 GM/100ML-% IV SOLN
2.0000 g | Freq: Three times a day (TID) | INTRAVENOUS | Status: AC
  Administered 2022-07-26 – 2022-07-27 (×2): 2 g via INTRAVENOUS
  Filled 2022-07-26 (×2): qty 100

## 2022-07-26 MED ORDER — FENTANYL CITRATE (PF) 250 MCG/5ML IJ SOLN
INTRAMUSCULAR | Status: DC | PRN
  Administered 2022-07-26: 100 ug via INTRAVENOUS
  Administered 2022-07-26: 50 ug via INTRAVENOUS

## 2022-07-26 MED ORDER — PROPOFOL 500 MG/50ML IV EMUL
INTRAVENOUS | Status: DC | PRN
  Administered 2022-07-26: 20 ug/kg/min via INTRAVENOUS

## 2022-07-26 MED ORDER — HEPARIN 6000 UNIT IRRIGATION SOLUTION
Status: DC | PRN
  Administered 2022-07-26 (×3): 1

## 2022-07-26 MED ORDER — HEPARIN 6000 UNIT IRRIGATION SOLUTION
Status: AC
  Filled 2022-07-26: qty 1500

## 2022-07-26 MED ORDER — PROPOFOL 10 MG/ML IV BOLUS
INTRAVENOUS | Status: AC
  Filled 2022-07-26: qty 20

## 2022-07-26 MED ORDER — EPHEDRINE SULFATE-NACL 50-0.9 MG/10ML-% IV SOSY
PREFILLED_SYRINGE | INTRAVENOUS | Status: DC | PRN
  Administered 2022-07-26: 10 mg via INTRAVENOUS

## 2022-07-26 MED ORDER — SODIUM CHLORIDE 0.9 % IV SOLN: INTRAVENOUS | Status: AC

## 2022-07-26 MED ORDER — ONDANSETRON HCL 4 MG/2ML IJ SOLN
INTRAMUSCULAR | Status: DC | PRN
  Administered 2022-07-26: 4 mg via INTRAVENOUS

## 2022-07-26 MED ORDER — ORAL CARE MOUTH RINSE: 15.0000 mL | Freq: Once | OROMUCOSAL | Status: AC

## 2022-07-26 MED ORDER — DOFETILIDE 500 MCG PO CAPS
500.0000 ug | ORAL_CAPSULE | Freq: Two times a day (BID) | ORAL | Status: DC
  Administered 2022-07-26 – 2022-07-27 (×2): 500 ug via ORAL
  Filled 2022-07-26 (×4): qty 1

## 2022-07-26 MED ORDER — CHLORHEXIDINE GLUCONATE 4 % EX LIQD: 60.0000 mL | Freq: Once | CUTANEOUS | Status: DC

## 2022-07-26 MED ORDER — HEPARIN SODIUM (PORCINE) 1000 UNIT/ML IJ SOLN
INTRAMUSCULAR | Status: AC
  Filled 2022-07-26: qty 2

## 2022-07-26 MED ORDER — CHLORHEXIDINE GLUCONATE 4 % EX LIQD: 30.0000 mL | CUTANEOUS | Status: DC

## 2022-07-26 MED ORDER — CHLORHEXIDINE GLUCONATE 0.12 % MT SOLN
15.0000 mL | Freq: Once | OROMUCOSAL | Status: AC
  Filled 2022-07-26: qty 15

## 2022-07-26 MED ORDER — LACTATED RINGERS IV SOLN: INTRAVENOUS | Status: DC

## 2022-07-26 MED ORDER — LIDOCAINE HCL 1 % IJ SOLN
INTRAMUSCULAR | Status: AC
  Filled 2022-07-26: qty 20

## 2022-07-26 MED ORDER — FENTANYL CITRATE (PF) 250 MCG/5ML IJ SOLN
INTRAMUSCULAR | Status: AC
  Filled 2022-07-26: qty 5

## 2022-07-26 MED ORDER — OXYCODONE HCL 5 MG PO TABS: 5.0000 mg | ORAL_TABLET | ORAL | Status: DC | PRN

## 2022-07-26 MED ORDER — TRAMADOL HCL 50 MG PO TABS: 50.0000 mg | ORAL_TABLET | ORAL | Status: DC | PRN

## 2022-07-26 MED ORDER — PHENYLEPHRINE HCL-NACL 20-0.9 MG/250ML-% IV SOLN
0.0000 ug/min | INTRAVENOUS | Status: DC
  Administered 2022-07-26: 10 ug/min via INTRAVENOUS

## 2022-07-26 MED ORDER — LIDOCAINE HCL 1 % IJ SOLN
INTRAMUSCULAR | Status: DC | PRN
  Administered 2022-07-26: 20 mL via INTRADERMAL

## 2022-07-26 MED ORDER — DUTASTERIDE 0.5 MG PO CAPS
0.5000 mg | ORAL_CAPSULE | Freq: Every morning | ORAL | Status: DC
  Administered 2022-07-27: 0.5 mg via ORAL
  Filled 2022-07-26: qty 1

## 2022-07-26 MED ORDER — PROPOFOL 10 MG/ML IV BOLUS
INTRAVENOUS | Status: DC | PRN
  Administered 2022-07-26: 40 mg via INTRAVENOUS

## 2022-07-26 MED ORDER — SODIUM CHLORIDE 0.9 % IV SOLN: 250.0000 mL | INTRAVENOUS | Status: DC | PRN

## 2022-07-26 MED ORDER — METOPROLOL TARTRATE 25 MG PO TABS
25.0000 mg | ORAL_TABLET | Freq: Every day | ORAL | Status: DC
  Administered 2022-07-27: 25 mg via ORAL
  Filled 2022-07-26: qty 1

## 2022-07-26 MED ORDER — ATORVASTATIN CALCIUM 10 MG PO TABS
10.0000 mg | ORAL_TABLET | Freq: Every morning | ORAL | Status: DC
  Administered 2022-07-27: 10 mg via ORAL
  Filled 2022-07-26: qty 1

## 2022-07-26 MED ORDER — HEPARIN SODIUM (PORCINE) 1000 UNIT/ML IJ SOLN
INTRAMUSCULAR | Status: DC | PRN
  Administered 2022-07-26: 14000 [IU] via INTRAVENOUS

## 2022-07-26 MED ORDER — TAMSULOSIN HCL 0.4 MG PO CAPS
0.4000 mg | ORAL_CAPSULE | Freq: Every day | ORAL | Status: DC
  Administered 2022-07-27: 0.4 mg via ORAL
  Filled 2022-07-26: qty 1

## 2022-07-26 MED ORDER — PHENYLEPHRINE 80 MCG/ML (10ML) SYRINGE FOR IV PUSH (FOR BLOOD PRESSURE SUPPORT)
PREFILLED_SYRINGE | INTRAVENOUS | Status: DC | PRN
  Administered 2022-07-26 (×2): 160 ug via INTRAVENOUS
  Administered 2022-07-26: 80 ug via INTRAVENOUS
  Administered 2022-07-26: 20 ug via INTRAVENOUS
  Administered 2022-07-26: 160 ug via INTRAVENOUS

## 2022-07-26 MED ORDER — MORPHINE SULFATE (PF) 2 MG/ML IV SOLN: 1.0000 mg | INTRAVENOUS | Status: DC | PRN

## 2022-07-26 SURGICAL SUPPLY — 66 items
ADH SKN CLS APL DERMABOND .7 (GAUZE/BANDAGES/DRESSINGS) ×2
APL PRP STRL LF DISP 70% ISPRP (MISCELLANEOUS) ×4
BAG COUNTER SPONGE SURGICOUNT (BAG) ×2 IMPLANT
BAG DECANTER FOR FLEXI CONT (MISCELLANEOUS) IMPLANT
BAG SPNG CNTER NS LX DISP (BAG) ×2
BLADE CLIPPER SURG (BLADE) IMPLANT
BLADE OSCILLATING /SAGITTAL (BLADE) IMPLANT
BLADE STERNUM SYSTEM 6 (BLADE) IMPLANT
CABLE ADAPT CONN TEMP 6FT (ADAPTER) ×2 IMPLANT
CATH DIAG EXPO 6F AL1 (CATHETERS) IMPLANT
CATH DIAG EXPO 6F FR4 (CATHETERS) IMPLANT
CATH DIAG EXPO 6F VENT PIG 145 (CATHETERS) ×4 IMPLANT
CATH INFINITI 6F AL2 (CATHETERS) IMPLANT
CATH S G BIP PACING (CATHETERS) ×2 IMPLANT
CHLORAPREP W/TINT 26 (MISCELLANEOUS) ×2 IMPLANT
CLOSURE MYNX CONTROL 6F/7F (Vascular Products) IMPLANT
CNTNR URN SCR LID CUP LEK RST (MISCELLANEOUS) ×4 IMPLANT
CONT SPEC 4OZ STRL OR WHT (MISCELLANEOUS) ×4
COVER BACK TABLE 80X110 HD (DRAPES) ×2 IMPLANT
DERMABOND ADVANCED .7 DNX12 (GAUZE/BANDAGES/DRESSINGS) ×2 IMPLANT
DEVICE CLOSURE PERCLS PRGLD 6F (VASCULAR PRODUCTS) ×4 IMPLANT
DRSG TEGADERM 4X4.75 (GAUZE/BANDAGES/DRESSINGS) ×4 IMPLANT
ELECT REM PT RETURN 9FT ADLT (ELECTROSURGICAL) ×2
ELECTRODE REM PT RTRN 9FT ADLT (ELECTROSURGICAL) ×2 IMPLANT
GAUZE SPONGE 4X4 12PLY STRL (GAUZE/BANDAGES/DRESSINGS) ×2 IMPLANT
GLOVE BIO SURGEON STRL SZ7.5 (GLOVE) ×2 IMPLANT
GLOVE BIOGEL PI IND STRL 6 (GLOVE) IMPLANT
GLOVE BIOGEL PI IND STRL 6.5 (GLOVE) IMPLANT
GLOVE ECLIPSE 7.0 STRL STRAW (GLOVE) ×2 IMPLANT
GLOVE ECLIPSE 7.5 STRL STRAW (GLOVE) IMPLANT
GOWN STRL REUS W/ TWL LRG LVL3 (GOWN DISPOSABLE) IMPLANT
GOWN STRL REUS W/ TWL XL LVL3 (GOWN DISPOSABLE) ×2 IMPLANT
GOWN STRL REUS W/TWL LRG LVL3 (GOWN DISPOSABLE) ×4
GOWN STRL REUS W/TWL XL LVL3 (GOWN DISPOSABLE) ×6
KIT BASIN OR (CUSTOM PROCEDURE TRAY) ×2 IMPLANT
KIT HEART LEFT (KITS) ×2 IMPLANT
KIT SAPIAN 3 ULTRA RESILIA 26 (Valve) IMPLANT
KIT TURNOVER KIT B (KITS) ×2 IMPLANT
NS IRRIG 1000ML POUR BTL (IV SOLUTION) ×2 IMPLANT
PACK ENDO MINOR (CUSTOM PROCEDURE TRAY) ×2 IMPLANT
PAD ARMBOARD 7.5X6 YLW CONV (MISCELLANEOUS) ×4 IMPLANT
PAD ELECT DEFIB RADIOL ZOLL (MISCELLANEOUS) ×2 IMPLANT
PERCLOSE PROGLIDE 6F (VASCULAR PRODUCTS) ×4
POSITIONER HEAD DONUT 9IN (MISCELLANEOUS) ×2 IMPLANT
SET MICROPUNCTURE 5F STIFF (MISCELLANEOUS) ×2 IMPLANT
SHEATH BRITE TIP 7FR 35CM (SHEATH) ×2 IMPLANT
SHEATH PINNACLE 6F 10CM (SHEATH) ×2 IMPLANT
SHEATH PINNACLE 8F 10CM (SHEATH) ×2 IMPLANT
SLEEVE REPOSITIONING LENGTH 30 (MISCELLANEOUS) ×2 IMPLANT
SPIKE FLUID TRANSFER (MISCELLANEOUS) ×2 IMPLANT
SPONGE T-LAP 18X18 ~~LOC~~+RFID (SPONGE) ×2 IMPLANT
STOPCOCK MORSE 400PSI 3WAY (MISCELLANEOUS) ×4 IMPLANT
SUT PROLENE 6 0 C 1 30 (SUTURE) IMPLANT
SUT SILK  1 MH (SUTURE) ×2
SUT SILK 1 MH (SUTURE) ×2 IMPLANT
SYR 30ML LL (SYRINGE) IMPLANT
SYR 3ML LL SCALE MARK (SYRINGE) IMPLANT
SYR 50ML LL SCALE MARK (SYRINGE) ×2 IMPLANT
SYR 50ML SLIP (SYRINGE) IMPLANT
SYR BULB IRRIG 60ML STRL (SYRINGE) IMPLANT
TOWEL GREEN STERILE (TOWEL DISPOSABLE) ×4 IMPLANT
TOWEL GREEN STERILE FF (TOWEL DISPOSABLE) IMPLANT
TRANSDUCER W/STOPCOCK (MISCELLANEOUS) ×4 IMPLANT
WIRE EMERALD 3MM-J .035X150CM (WIRE) ×2 IMPLANT
WIRE EMERALD 3MM-J .035X260CM (WIRE) ×2 IMPLANT
WIRE EMERALD ST .035X260CM (WIRE) ×4 IMPLANT

## 2022-07-26 NOTE — Interval H&P Note (Signed)
History and Physical Interval Note:  07/26/2022 12:28 PM  Patrick Franco  has presented today for surgery, with the diagnosis of Severe Aortic Stenosis.  The various methods of treatment have been discussed with the patient and family. After consideration of risks, benefits and other options for treatment, the patient has consented to  Procedure(s): Transcatheter Aortic Valve Replacement, Transfemoral (N/A) INTRAOPERATIVE TRANSTHORACIC ECHOCARDIOGRAM (N/A) as a surgical intervention.  The patient's history has been reviewed, patient examined, no change in status, stable for surgery.  I have reviewed the patient's chart and labs.  Questions were answered to the patient's satisfaction.     Gaye Pollack

## 2022-07-26 NOTE — Progress Notes (Signed)
  Echocardiogram 2D Echocardiogram has been performed.  Bobbye Charleston 07/26/2022, 5:02 PM

## 2022-07-26 NOTE — Discharge Summary (Signed)
[place] VALVE TEAM  Discharge Summary    Patient ID: Patrick Franco MRN: [id number]; DOB: Mar 05, 1932  Admit date: 07/26/2022 Discharge date: 07/27/2022  Primary Care Provider: Lavone Orn, MD  Primary Cardiologist: [name], MD   Discharge Diagnoses    Principal Problem:   S/P TAVR (transcatheter aortic valve replacement) Active Problems:   Hypertension   Sleep apnea   Mitral valve prolapse   (HFpEF) heart failure with preserved ejection fraction (HCC)   CAD (coronary artery disease)   PAF (paroxysmal atrial fibrillation) ([place])   Severe aortic stenosis  Allergies No Known Allergies  Diagnostic Studies/Procedures    TAVR OPERATIVE NOTE     Date of Procedure:                07/26/2022   Preoperative Diagnosis:      Severe Aortic Stenosis    Postoperative Diagnosis:    Same    Procedure:        Transcatheter Aortic Valve Replacement - Percutaneous Left Transfemoral Approach             Edwards Sapien 3 Ultra THV (size 26 mm, model # 9755RSL, serial # [id number])              Co-Surgeons:                        Coralie Common MD and [name] /     Anesthesiologist:                  Albertha Ghee MD   Echocardiographer:              Eleonore Chiquito   Pre-operative Echo Findings: Severe aortic stenosis Normal left ventricular systolic function   Post-operative Echo Findings: no paravalvular leak Normal left ventricular systolic function ____________   Echo 07/27/22: Completed but pending formal read at the time of discharge   History of Present Illness     Patrick Franco is a 88 y.o. male with a history of CAD, HTN, mitral valve prolapse, chronic diastolic CHF, PAF on Eliquis, sleep apnea and severe aortic stenosis who presented to [facility] on 07/26/22 for planned TAVR.   Patrick Franco has been followed by Dr. Angelena Form for his cardiology care. He had an echocardiogram 09/2016 which showed an LVEF at 65-70%, grade  1 diastolic dysfunction, functionally bicuspid aortic valve with moderate stenosis (mean gradient 21 mmHg, AVA 1.4cm2) with mild mitral regurgitation. He was referred to Dr. Angelena Form to follow his AS. Repeat echos over the years showed mild progression with most recent from 04/2022 showing a normal LVEFT at 60-65%, and worsening AS with a mean gradient at 38.64mHg, peak 62.337mg, AVA by VTI at 1.0cm2, and DI at 0.26. At that time he was having progression of his symptoms as well. He underwent R/[facility]/13/23 which showed a heavily calcified LAD lesion with moderate proximal and mid disease.There appears to be a completely occluded branch just beyond this stenosis and then the LAD continues to the apex. Circumflex has a moderate mid stenosis. RCA is a large dominant vessel with heavy calcification throughout. Mild plaque in the proximal and mid vessel. Recommendations were to continue TAVR workup and treat his CAD medically. After his TAVR, if he continues to have dyspnea on exertion, can then consider complex PCI of the mid to distal LAD with orbital atherectomy and stenting.  Pre TAVR scan showed anatomy suitable for a 2619m  S3UR valve.   He was evaluated by the multidisciplinary valve team and felt to have severe, symptomatic aortic stenosis and to be a suitable candidate for TAVR, which was set up for 07/26/22.     Hospital Course     Severe AS: s/p successful TAVR with a 26 mm Edwards Sapien 3 THV via the TF approach on 07/26/22. Post operative echo pending. Groin sites are stable. ECG with NSR and no high grade heart block. Eliquis restarted this AM. Post procedure instructions reviewed with understanding. He has ambulated with CR with no complication. SBE discussed and will be RX'ed at follow up next week.    CAD: As above, he underwent [facility] 05/26/22 which showed a heavily calcified LAD lesion with moderate proximal and mid disease.There appears to be a completely occluded branch just beyond this stenosis  and then the LAD continues to the apex. Circumflex has a moderate mid stenosis. RCA is a large dominant vessel with heavy calcification throughout. Mild plaque in the proximal and mid vessel. Recommendations were to continue TAVR workup and treat his CAD medically. After his TAVR, if he continues to have dyspnea on exertion, can then consider complex PCI of the mid to distal LAD with orbital atherectomy and stenting. No ASA given Eliquis.   PAF: Maintaining sinus rhythm. Continue dofetilide 500 mcg twice daily. QTC is stable. Restart Eliquis today   Hypertension: Had post op hypotension requiring brief pressor support. Pressors have weaned off this AM with stabilized BPs.   Chronic diastolic CHF: Appears euvolemic on exam today. Plan to restart home regimen.    Mitral valve prolapse: Continue to follow with surveillence echocardiograms.   Consultants: None    The patient was seen and examined by Dr. Angelena Form and felt to be stable and ready for discharge today, 07/27/22.  _____________  Discharge Vitals Blood pressure (!) 151/91, pulse 83, temperature 98.2 F (36.8 C), temperature source Oral, resp. rate 14, height 5' 10"  (1.778 m), weight 96.6 kg, SpO2 93 %.  Filed Weights   07/26/22 1201 07/26/22 2000 07/27/22 0500  Weight: 94.3 kg 95.3 kg 96.6 kg   General: Well developed, well nourished, NAD Neck: Negative for carotid bruits. No JVD Lungs:Clear to ausculation bilaterally. No wheezes, rales, or rhonchi. Breathing is unlabored. Cardiovascular: RRR with S1 S2. Soft flow murmur Abdomen: Soft, non-tender, non-distended. No obvious abdominal masses. Extremities: No edema.  Neuro: Alert and oriented. No focal deficits. No facial asymmetry. MAE spontaneously. Psych: Responds to questions appropriately with normal affect.    Labs & Radiologic Studies    CBC Recent Labs    07/26/22 2159 07/27/22 0252  WBC  --  12.7*  HGB 13.6 12.9*  HCT 40.0 37.5*  MCV  --  99.2  PLT  --  133*    Basic Metabolic Panel Recent Labs    07/26/22 2159 07/27/22 0252  NA 142 140  K 3.5 4.2  CL 107 111  CO2  --  22  GLUCOSE 98 116*  BUN 21 22  CREATININE 0.70 1.00  CALCIUM  --  8.3*  MG  --  1.7   Liver Function Tests No results for input(s): "AST", "ALT", "ALKPHOS", "BILITOT", "PROT", "ALBUMIN" in the last 72 hours. No results for input(s): "LIPASE", "AMYLASE" in the last 72 hours. Cardiac Enzymes No results for input(s): "CKTOTAL", "CKMB", "CKMBINDEX", "TROPONINI" in the last 72 hours. BNP Invalid input(s): "POCBNP" D-Dimer No results for input(s): "DDIMER" in the last 72 hours. Hemoglobin A1C No results for input(s): "HGBA1C"  in the last 72 hours. Fasting Lipid Panel No results for input(s): "CHOL", "HDL", "LDLCALC", "TRIG", "CHOLHDL", "LDLDIRECT" in the last 72 hours. Thyroid Function Tests No results for input(s): "TSH", "T4TOTAL", "T3FREE", "THYROIDAB" in the last 72 hours.  Invalid input(s): "FREET3" _____________  Structural Heart Procedure  Result Date: 07/26/2022 See surgical note for result.  ECHOCARDIOGRAM LIMITED  Result Date: 07/26/2022    ECHOCARDIOGRAM LIMITED REPORT   Patient Name:   Patrick Franco Date of Exam: 07/26/2022 Medical Rec #:  [id number]   Height:       70.0 in Accession #:    [id number]  Weight:       208.0 lb Date of Birth:  March 08, 1932   BSA:          2.122 m Patient Age:    50 years    BP:           162/91 mmHg Patient Gender: M           HR:           89 bpm. Exam Location:  Inpatient Procedure: Limited Echo, Cardiac Doppler and Color Doppler Indications:     I35.0 Nonrheumatic aortic (valve) stenosis  History:         Patient has prior history of Echocardiogram examinations, most                  recent 05/04/2022. CAD, Mitral Valve Disease, Aortic Valve                  Disease and Mitral Valve Prolapse, Signs/Symptoms:Dyspnea; Risk                  Factors:Sleep Apnea, Hypertension and Dyslipidemia.                  Aortic Valve: 26 mm Sapien  prosthetic, stented (TAVR) valve is                  present in the aortic position. Procedure Date: 07/26/2022.  Sonographer:     Roseanna Rainbow RDCS Referring Phys:  [place] Diagnosing Phys: Eleonore Chiquito MD  Sonographer Comments: Suboptimal parasternal window, Technically difficult study due to poor echo windows and suboptimal apical window. Poor imaging windows due to supine position. IMPRESSIONS  1. 26 mm S3. Vmax 1.0 m/s, MG 2.0 mmHG, EOA 3.60 cm2, DI 1.0. No regurgitation or paravalvular leak. No immediate complications. The aortic valve has been repaired/replaced. Moderate to severe aortic valve stenosis. There is a 26 mm Sapien prosthetic (TAVR) valve present in the aortic position. Procedure Date: 07/26/2022.  2. Left ventricular ejection fraction, by estimation, is 60 to 65%. The left ventricle has normal function. There is severe concentric left ventricular hypertrophy.  3. Right ventricular systolic function is normal. The right ventricular size is normal.  4. The mitral valve is degenerative. Mild mitral valve regurgitation. There is moderate holosystolic prolapse of both leaflets of the mitral valve. Moderate to severe mitral annular calcification. FINDINGS  Left Ventricle: Left ventricular ejection fraction, by estimation, is 60 to 65%. The left ventricle has normal function. The left ventricular internal cavity size was normal in size. There is severe concentric left ventricular hypertrophy. Right Ventricle: The right ventricular size is normal. No increase in right ventricular wall thickness. Right ventricular systolic function is normal. Pericardium: Trivial pericardial effusion is present. Presence of epicardial fat layer. Mitral Valve: The mitral valve is degenerative in appearance. There is moderate holosystolic prolapse of both  leaflets of the mitral valve. Moderate to severe mitral annular calcification. Mild mitral valve regurgitation. Tricuspid Valve: The tricuspid valve is  grossly normal. Tricuspid valve regurgitation is trivial. Aortic Valve: 26 mm S3. Vmax 1.0 m/s, MG 2.0 mmHG, EOA 3.60 cm2, DI 1.0. No regurgitation or paravalvular leak. No immediate complications. The aortic valve has been repaired/replaced. Moderate to severe aortic stenosis is present. Aortic valve mean gradient measures 2.0 mmHg. Aortic valve peak gradient measures 4.0 mmHg. Aortic valve area, by VTI measures 3.60 cm. There is a 26 mm Sapien prosthetic, stented (TAVR) valve present in the aortic position. Procedure Date: 07/26/2022. Aorta: The aortic root and ascending aorta are structurally normal, with no evidence of dilitation. LEFT VENTRICLE PLAX 2D LVIDd:         4.80 cm LVIDs:         3.20 cm LV PW:         1.80 cm LV IVS:        1.80 cm LVOT diam:     2.14 cm LV SV:         61 LV SV Index:   29 LVOT Area:     3.60 cm  AORTIC VALVE AV Area (Vmax):    3.39 cm AV Area (Vmean):   3.22 cm AV Area (VTI):     3.60 cm AV Vmax:           100.00 cm/s AV Vmean:          68.400 cm/s AV VTI:            0.170 m AV Peak Grad:      4.0 mmHg AV Mean Grad:      2.0 mmHg LVOT Vmax:         94.30 cm/s LVOT Vmean:        61.200 cm/s LVOT VTI:          0.170 m LVOT/AV VTI ratio: 1.00  AORTA Ao Root diam: 3.80 cm Ao Asc diam:  3.60 cm  SHUNTS Systemic VTI:  0.17 m Systemic Diam: 2.14 cm Eleonore Chiquito MD Electronically signed by Eleonore Chiquito MD Signature Date/Time: 07/26/2022/4:59:18 PM    Final    DG Chest 2 View  Result Date: 07/24/2022 CLINICAL DATA:  Preop for heart surgery. EXAM: CHEST - 2 VIEW COMPARISON:  Chest x-ray 08/08/2017 FINDINGS: The heart is enlarged. There is bibasilar atelectasis. There is no evidence for pleural effusion or pneumothorax. There is dextroconvex curvature of the thoracic spine. No acute fractures are seen. IMPRESSION: 1. Cardiomegaly. 2. Bibasilar atelectasis. Electronically Signed   By: Ronney Asters M.D.   On: 07/24/2022 21:25    Disposition   Pt is being discharged home today in  good condition.  Follow-up Plans & Appointments    Follow-up Information     Eileen Stanford, PA-C Follow up on 08/05/2022.   Specialties: Cardiology, Radiology Why: @ 1pm. Please arrive to your appointment at 12:45pm. Contact information: [place] [phone]                Discharge Instructions     Amb Referral to Cardiac Rehabilitation   Complete by: As directed    Diagnosis: Valve Replacement   Valve: Aortic   After initial evaluation and assessments completed: Virtual Based Care may be provided alone or in conjunction with Phase 2 Cardiac Rehab based on patient barriers.: Yes   Intensive Cardiac Rehabilitation (ICR) [facility] location only OR Traditional Cardiac Rehabilitation (  TCR) *If criteria for ICR are not met will enroll in TCR [facility] only): Yes   Call MD for:  difficulty breathing, headache or visual disturbances   Complete by: As directed    Call MD for:  extreme fatigue   Complete by: As directed    Call MD for:  hives   Complete by: As directed    Call MD for:  persistant dizziness or light-headedness   Complete by: As directed    Call MD for:  persistant nausea and vomiting   Complete by: As directed    Call MD for:  redness, tenderness, or signs of infection (pain, swelling, redness, odor or green/yellow discharge around incision site)   Complete by: As directed    Call MD for:  severe uncontrolled pain   Complete by: As directed    Call MD for:  temperature >100.4   Complete by: As directed    Change dressing (specify)   Complete by: As directed    May take the dressing off after discharge home. If you feel comfortable, can place Bandaid or leave open to air.   Diet - low sodium heart healthy   Complete by: As directed    Discharge instructions   Complete by: As directed    ACTIVITY AND EXERCISE  Daily activity and exercise are an important part of your recovery. People recover at different rates  depending on their general health and type of valve procedure.  Most people recovering from TAVR feel better relatively quickly   No lifting, pushing, pulling more than 10 pounds (examples to avoid: groceries, vacuuming, gardening, golfing):             - For one week with a procedure through the groin.             - For six weeks for procedures through the chest wall or neck. NOTE: You will typically see one of our providers 7-14 days after your procedure to discuss [place] the above activities.      DRIVING  Do not drive until you are seen for follow up and cleared by a provider. Generally, we ask patient to not drive for 1 week after their procedure.  If you have been told by your doctor in the past that you may not drive, you must talk with him/her before you begin driving again.   DRESSING  Groin site: you may leave the clear dressing over the site for up to one week or until it falls off.   HYGIENE  If you had a femoral (leg) procedure, you may take a shower when you return home. After the shower, pat the site dry. Do NOT use powder, oils or lotions in your groin area until the site has completely healed.  If you had a chest procedure, you may shower when you return home unless specifically instructed not to by your discharging practitioner.             - DO NOT scrub incision; pat dry with a towel.             - DO NOT apply any lotions, oils, powders to the incision.             - No tub baths / swimming for at least 2 weeks.  If you notice any fevers, chills, increased pain, swelling, bleeding or pus, please contact your doctor.   ADDITIONAL INFORMATION  If you are going to have an upcoming dental procedure, please  contact our office as you will require antibiotics ahead of time to prevent infection on your heart valve.    If you have any questions or concerns you can call the structural heart phone during normal business hours 8am-4pm. If you have an urgent need  after hours or weekends please call [phone] to talk to the on call provider for general cardiology. If you have an emergency that requires immediate attention, please call 911.    After TAVR Checklist  Check  Test Description  Follow up appointment in 1-2 weeks  You will see our structural heart advanced practice provider. Your incision sites will be checked and you will be cleared to drive and resume all normal activities if you are doing well.    1 month echo and follow up  You will have an echo to check on your new heart valve and be seen back in the office by a structural heart advanced practice provider.  Follow up with your primary cardiologist You will need to be seen by your primary cardiologist in the following 3-6 months after your 1 month appointment in the valve clinic.   1 year echo and follow up You will have another echo to check on your heart valve after 1 year and be seen back in the office by a structural heart advanced practice provider. This your last structural heart visit.  Bacterial endocarditis prophylaxis  You will have to take antibiotics for the rest of your life before all dental procedures (even teeth cleanings) to protect your heart valve. Antibiotics are also required before some surgeries. Please check with your cardiologist before scheduling any surgeries. Also, please make sure to tell us if you have a penicillin allergy as you will require an alternative antibiotic.   Increase activity slowly   Complete by: As directed       Discharge Medications   Allergies as of 07/27/2022   No Known Allergies      Medication List     TAKE these medications    allopurinol 100 MG tablet Commonly known as: ZYLOPRIM Take 2 tablets (200 mg total) by mouth daily.   apixaban 5 MG Tabs tablet Commonly known as: Eliquis Take 1 tablet (5 mg total) by mouth 2 (two) times daily.   atorvastatin 10 MG tablet Commonly known as: LIPITOR Take 10 mg by mouth every  morning.   cyanocobalamin 1000 MCG tablet Commonly known as: VITAMIN B12 Take 1,000 mcg by mouth daily.   diltiazem 240 MG 24 hr capsule Commonly known as: CARDIZEM CD TAKE (1) CAPSULE DAILY.   dofetilide 500 MCG capsule Commonly known as: TIKOSYN TAKE ONE CAPSULE BY MOUTH TWICE DAILY   dutasteride 0.5 MG capsule Commonly known as: AVODART Take 0.5 mg by mouth every morning.   Fish Oil 1200 MG Caps Take 1,200 mg by mouth daily.   Flutter Devi Use the flutter valve 10 times when doing it daily.   furosemide 40 MG tablet Commonly known as: LASIX Take 60 mg by mouth 4 (four) times a week.   metoprolol tartrate 25 MG tablet Commonly known as: LOPRESSOR Take 1 tablet (25 mg total) by mouth daily.   mirtazapine 30 MG tablet Commonly known as: REMERON TAKE ONE TABLET AT BEDTIME.   multivitamin tablet Take 1 tablet by mouth daily.   pantoprazole 40 MG tablet Commonly known as: Protonix Take 1 tablet (40 mg total) by mouth daily.   potassium chloride SA 20 MEQ tablet Commonly known as: KLOR-CON M TAKE 1  TABLET BY MOUTH IN THE MORNING, TAKE 1/2 TABLET BY MOUTH IN THE P.M.   PROBIOTIC DAILY PO Take 1 tablet by mouth daily.   pyridOXINE 100 MG tablet Commonly known as: VITAMIN B6 Take 100 mg by mouth daily.   spironolactone 25 MG tablet Commonly known as: ALDACTONE Take 0.5 tablets (12.5 mg total) by mouth daily.   Systane 0.4-0.3 % Soln Generic drug: Polyethyl Glycol-Propyl Glycol Place 1 drop into both eyes 2 (two) times daily.   tamsulosin 0.4 MG Caps capsule Commonly known as: FLOMAX Take 0.4 mg by mouth daily after breakfast.   Turmeric 500 MG Caps Take 500 mg by mouth daily.               Discharge Care Instructions  (From admission, onward)           Start     Ordered   07/27/22 0000  Change dressing (specify)       Comments: May take the dressing off after discharge home. If you feel comfortable, can place Bandaid or leave open to air.    07/27/22 1148            Outstanding Labs/Studies   None   Duration of Discharge Encounter   Greater than 30 minutes including physician time.  Signed, Kathyrn Drown, NP 07/27/2022, 11:49 AM [phone]   I have personally seen and examined this patient. I agree with the assessment and plan as outlined above.  Doing well post TAVR. Groins stable. BP stable.  Discharge home today  Kathyrn Drown, MD, [facility] 07/27/2022 11:49 AM

## 2022-07-26 NOTE — Anesthesia Preprocedure Evaluation (Signed)
Anesthesia Evaluation  Patient identified by MRN, date of birth, ID band Patient awake    Reviewed: Allergy & Precautions, H&P , NPO status , Patient's Chart, lab work & pertinent test results  Airway Mallampati: II   Neck ROM: full    Dental   Pulmonary sleep apnea , former smoker,    breath sounds clear to auscultation       Cardiovascular hypertension, + CAD  + dysrhythmias Atrial Fibrillation + Valvular Problems/Murmurs AS  Rhythm:regular Rate:Normal     Neuro/Psych PSYCHIATRIC DISORDERS Depression  Neuromuscular disease    GI/Hepatic PUD,   Endo/Other    Renal/GU      Musculoskeletal  (+) Arthritis ,   Abdominal   Peds  Hematology   Anesthesia Other Findings   Reproductive/Obstetrics                             Anesthesia Physical Anesthesia Plan  ASA: 3  Anesthesia Plan: MAC   Post-op Pain Management:    Induction: Intravenous  PONV Risk Score and Plan: 1 and Propofol infusion and Treatment may vary due to age or medical condition  Airway Management Planned: Simple Face Mask  Additional Equipment: Arterial line  Intra-op Plan:   Post-operative Plan:   Informed Consent: I have reviewed the patients History and Physical, chart, labs and discussed the procedure including the risks, benefits and alternatives for the proposed anesthesia with the patient or authorized representative who has indicated his/her understanding and acceptance.     Dental advisory given  Plan Discussed with: CRNA, Anesthesiologist and Surgeon  Anesthesia Plan Comments:         Anesthesia Quick Evaluation

## 2022-07-26 NOTE — Transfer of Care (Signed)
Immediate Anesthesia Transfer of Care Note  Patient: Patrick Franco  Procedure(s) Performed: Transcatheter Aortic Valve Replacement, Transfemoral using a 26 MM Edwards SAPIEN 3 Ultra (Chest) INTRAOPERATIVE TRANSTHORACIC ECHOCARDIOGRAM  Patient Location: Cath Lab  Anesthesia Type:MAC  Level of Consciousness: awake and alert   Airway & Oxygen Therapy: Patient Spontanous Breathing and Patient connected to nasal cannula oxygen  Post-op Assessment: Report given to RN and Post -op Vital signs reviewed and stable  Post vital signs: Reviewed and stable  Last Vitals:  Vitals Value Taken Time  BP 92/52 07/26/22 1745  Temp    Pulse 79 07/26/22 1749  Resp 23 07/26/22 1749  SpO2 94 % 07/26/22 1749  Vitals shown include unvalidated device data.  Last Pain:  Vitals:   07/26/22 1223  TempSrc:   PainSc: 0-No pain         Complications: No notable events documented.

## 2022-07-26 NOTE — Op Note (Signed)
[facility]   MULTIDISCIPLINARY HEART VALVE TEAM   TAVR OPERATIVE NOTE   Date of Procedure:  07/26/2022  Preoperative Diagnosis: Severe Aortic Stenosis   Postoperative Diagnosis: Same   Procedure:   Transcatheter Aortic Valve Replacement - Percutaneous Left Transfemoral Approach  Edwards Sapien 3 Ultra THV (size 26 mm, model # [id number], serial # [id number])   Co-Surgeons:  Coralie Common MD and [name] /   Anesthesiologist:  Albertha Ghee MD  Echocardiographer:  Eleonore Chiquito  Pre-operative Echo Findings: Severe aortic stenosis Normal left ventricular systolic function  Post-operative Echo Findings: no paravalvular leak Normal left ventricular systolic function   BRIEF CLINICAL NOTE AND INDICATIONS FOR SURGERY    This 88 year old gentleman has stage D, severe, symptomatic aortic stenosis with New York Heart Association class II symptoms of exertional fatigue and shortness of breath consistent with chronic diastolic congestive heart failure.  I have personally reviewed his 2D echocardiogram, cardiac catheterization, and CTA studies.  His echocardiogram shows a severely calcified and thickened aortic valve with restricted leaflet mobility. The mean gradient has increased to 38.4 mmHg with a peak gradient of 62 mmHg and a valve area 1.0 cm by VTI consistent with severe aortic stenosis.  There is severe mitral annular calcification with mild mitral valve regurgitation.  Left ventricular ejection fraction is 60 to 65% with mild concentric LVH and grade 2 diastolic dysfunction.  Cardiac catheterization shows a 99% mid to distal LAD stenosis which is heavily calcified and could not be crossed at the time of catheterization.  The patient denies any chest pain or pressure.  I agree that aortic valve replacement is indicated for relief of his progressive symptoms and to prevent left ventricular dysfunction.  Given his advanced age I think that transcatheter aortic  valve replacement would be the best treatment for him.  I think his LAD stenosis can be treated medically for now and if he develops any chest discomfort symptoms after TAVR or has persistent exertional shortness of breath and I think atherectomy and PCI could be entertained.  His gated cardiac CTA shows anatomy suitable for TAVR using a SAPIEN 3 valve.  His valve appears to be trileaflet with some protruding calcium under the noncoronary cusp.  His abdominal and pelvic CTA shows adequate pelvic vascular anatomy to allow transfemoral insertion.   The patient and his wife were counseled at length regarding treatment alternatives for management of severe symptomatic aortic stenosis. The risks and benefits of surgical intervention has been discussed in detail. Long-term prognosis with medical therapy was discussed. Alternative approaches such as conventional surgical aortic valve replacement, transcatheter aortic valve replacement, and palliative medical therapy were compared and contrasted at length. This discussion was placed in the context of the patient's own specific clinical presentation and past medical history. All of their questions have been addressed.     DETAILS OF THE OPERATIVE PROCEDURE  PREPARATION:    The patient was brought to the operating room on the above mentioned date and appropriate monitoring was established by the anesthesia team. The patient was placed in the supine position on the operating table.  Intravenous antibiotics were administered. The patient was monitored closely throughout the procedure under conscious sedation.  Baseline transthoracic echocardiogram was performed. The patient's abdomen and both groins were prepped and draped in a sterile manner. A time out procedure was performed.   PERIPHERAL ACCESS:    Using the modified Seldinger technique, femoral arterial and venous access was obtained with placement of 6 Fr sheaths  on the right side.  A pigtail diagnostic  catheter was passed through the right arterial sheath under fluoroscopic guidance into the aortic root.  A temporary transvenous pacemaker catheter was passed through the right femoral venous sheath under fluoroscopic guidance into the right ventricle.  The pacemaker was tested to ensure stable lead placement and pacemaker capture. Aortic root angiography was performed in order to determine the optimal angiographic angle for valve deployment.   TRANSFEMORAL ACCESS:   Percutaneous transfemoral access and sheath placement was performed using ultrasound guidance.  The left common femoral artery was cannulated using a micropuncture needle and appropriate location was verified using hand injection angiogram.  A pair of Abbott Perclose percutaneous closure devices were placed and a 6 French sheath replaced into the femoral artery.  The patient was heparinized systemically and ACT verified > 250 seconds.    A 14 Fr transfemoral E-sheath was introduced into the left common femoral artery after progressively dilating over an Amplatz superstiff wire. An AL2 catheter was used to direct a straight-tip exchange length wire across the native aortic valve into the left ventricle. This was exchanged out for a pigtail catheter and position was confirmed in the LV apex. The pigtail catheter was exchanged for a Safari wire in the LV apex.   BALLOON AORTIC VALVULOPLASTY:   Not done   TRANSCATHETER HEART VALVE DEPLOYMENT:   An Edwards Sapien 3 Ultra transcatheter heart valve (size 26 mm) was prepared and crimped per manufacturer's guidelines, and the proper orientation of the valve is confirmed on the [employer] delivery system. The valve was advanced through the introducer sheath using normal technique until in an appropriate position in the abdominal aorta beyond the sheath tip. The balloon was then retracted and using the fine-tuning wheel was centered on the valve. The valve was then advanced across the aortic  arch using appropriate flexion of the catheter. The valve was carefully positioned across the aortic valve annulus. The Commander catheter was retracted using normal technique. Once final position of the valve has been confirmed by angiographic assessment, the valve is deployed during rapid ventricular pacing to maintain systolic blood pressure < 50 mmHg and pulse pressure < 10 mmHg. The balloon inflation is held for >3 seconds after reaching full deployment volume. Once the balloon has fully deflated the balloon is retracted into the ascending aorta and valve function is assessed using echocardiography. There is felt to be no paravalvular leak and no central aortic insufficiency.  The patient's hemodynamic recovery following valve deployment is good.  The deployment balloon and guidewire are both removed.    PROCEDURE COMPLETION:   The sheath was removed and femoral artery closure performed.  Protamine was administered once femoral arterial repair was complete. The temporary pacemaker, pigtail catheter and femoral sheaths were removed with manual pressure used for venous hemostasis.  A Mynx femoral closure device was utilized following removal of the diagnostic sheath in the right femoral artery.  The patient tolerated the procedure well and is transported to the cath lab recovery area in stable condition. There were no immediate intraoperative complications. All sponge instrument and needle counts are verified correct at completion of the operation.   No blood products were administered during the operation.  The patient received a total of 60 mL of intravenous contrast during the procedure.   Coralie Common, MD 07/26/2022 5:12 PM

## 2022-07-26 NOTE — Anesthesia Procedure Notes (Signed)
Arterial Line Insertion Start/End9/10/2022 12:50 PM, 07/26/2022 12:55 PM Performed by: Colin Benton, CRNA, CRNA  Patient location: Pre-op. Preanesthetic checklist: patient identified, IV checked, site marked, risks and benefits discussed, surgical consent, monitors and equipment checked, pre-op evaluation, timeout performed and anesthesia consent Lidocaine 1% used for infiltration Right, radial was placed Catheter size: 20 G Hand hygiene performed  and maximum sterile barriers used  Allen's test indicative of satisfactory collateral circulation Attempts: 1 Procedure performed without using ultrasound guided technique. Following insertion, dressing applied and Biopatch. Post procedure assessment: normal and unchanged  Patient tolerated the procedure with difficulty.

## 2022-07-26 NOTE — CV Procedure (Signed)
[facility]  TAVR OPERATIVE NOTE   Date of Procedure:  07/26/2022  Preoperative Diagnosis: Severe Aortic Stenosis   Postoperative Diagnosis: Same   Procedure:   Transcatheter Aortic Valve Replacement - Transfemoral Approach  Edwards Sapien 3 THV (size 26 mm, model # [id number], serial # [id number])   Co-Surgeons:  [name], MD and Gaye Pollack, MD/Paul Lavonna Monarch, MD  Anesthesiologist:  Hodierne  Echocardiographer:  O'Neal  Pre-operative Echo Findings: Severe aortic stenosis Normal left ventricular systolic function  Post-operative Echo Findings: No paravalvular leak Normal left ventricular systolic function  BRIEF CLINICAL NOTE AND INDICATIONS FOR SURGERY  88 yo male with history of CAD, aortic stenosis, HTN, mitral valve prolapse, chronic diastolic CHF, atrial fibrillation and sleep apnea here today for TAVR.  Last echo 05/04/22 with LVEF=60-65%. Mild LVH. Normal RV function. Severe dilation left atrium. Mild MR. Mild MS. His aortic stenosis has progressed. The mean gradient is now 38.4 mmHg with peak gradient 62.3 mmHg, AVA 0.85 cm2, DI 0.26. This is now consistent with severe aortic stenosis. Cardiac cath with moderate CAD.   During the course of the patient's preoperative work up they have been evaluated comprehensively by a multidisciplinary team of specialists coordinated through the [facility] in the [facility].  They have been demonstrated to suffer from symptomatic severe aortic stenosis as noted above. The patient has been counseled extensively as to the relative risks and benefits of all options for the treatment of severe aortic stenosis including long term medical therapy, conventional surgery for aortic valve replacement, and transcatheter aortic valve replacement.  The patient has been independently evaluated by Dr. Cyndia Bent with CT surgery and they are felt to be at high risk for conventional  surgical aortic valve replacement. The surgeon indicated the patient would be a poor candidate for conventional surgery. Based upon review of all of the patient's preoperative diagnostic tests they are felt to be candidate for transcatheter aortic valve replacement using the transfemoral approach as an alternative to high risk conventional surgery.    Following the decision to proceed with transcatheter aortic valve replacement, a discussion has been held regarding what types of management strategies would be attempted intraoperatively in the event of life-threatening complications, including whether or not the patient would be considered a candidate for the use of cardiopulmonary bypass and/or conversion to open sternotomy for attempted surgical intervention.  The patient has been advised of a variety of complications that might develop peculiar to this approach including but not limited to risks of death, stroke, paravalvular leak, aortic dissection or other major vascular complications, aortic annulus rupture, device embolization, cardiac rupture or perforation, acute myocardial infarction, arrhythmia, heart block or bradycardia requiring permanent pacemaker placement, congestive heart failure, respiratory failure, renal failure, pneumonia, infection, other late complications related to structural valve deterioration or migration, or other complications that might ultimately cause a temporary or permanent loss of functional independence or other long term morbidity.  The patient provides full informed consent for the procedure as described and all questions were answered preoperatively.    DETAILS OF THE OPERATIVE PROCEDURE  PREPARATION:   The patient is brought to the operating room on the above mentioned date and central monitoring was established by the anesthesia team including placement of a radial arterial line. The patient is placed in the supine position on the operating table.  Intravenous  antibiotics are administered. Conscious sedation is used.   Baseline transthoracic echocardiogram was performed. The patient's chest,  abdomen, both groins, and both lower extremities are prepared and draped in a sterile manner. A time out procedure is performed.   PERIPHERAL ACCESS:   Using the modified Seldinger technique, femoral arterial and venous access were obtained with placement of a 6 Fr sheath in the artery and a 7 Fr sheath in the vein on the right side using u/s guidance.  A pigtail diagnostic catheter was passed through the femoral arterial sheath under fluoroscopic guidance into the aortic root.  A temporary transvenous pacemaker catheter was passed through the femoral venous sheath under fluoroscopic guidance into the right ventricle.  The pacemaker was tested to ensure stable lead placement and pacemaker capture. Aortic root angiography was performed in order to determine the optimal angiographic angle for valve deployment.  TRANSFEMORAL ACCESS:  A micropuncture kit was used to gain access to the left femoral artery using u/s guidance. Position confirmed with angiography. Pre-closure with double ProGlide closure devices. The patient was heparinized systemically and ACT verified > 250 seconds.    A 14 Fr transfemoral E-sheath was introduced into the left femoral artery after progressively dilating over an Amplatz superstiff wire. An AL-2 catheter was used to direct a straight-tip exchange length wire across the native aortic valve into the left ventricle. This was exchanged out for a pigtail catheter and position was confirmed in the LV apex. Simultaneous LV and Ao pressures were recorded.  The pigtail catheter was then exchanged for an Amplatz Extra-stiff wire in the LV apex.   TRANSCATHETER HEART VALVE DEPLOYMENT:  An Edwards Sapien 3 THV (size 26 mm) was prepared and crimped per manufacturer's guidelines, and the proper orientation of the valve is confirmed on the [employer]  delivery system. The valve was advanced through the introducer sheath using normal technique until in an appropriate position in the abdominal aorta beyond the sheath tip. The balloon was then retracted and using the fine-tuning wheel was centered on the valve. The valve was then advanced across the aortic arch using appropriate flexion of the catheter. The valve was carefully positioned across the aortic valve annulus. The Commander catheter was retracted using normal technique. Once final position of the valve has been confirmed by angiographic assessment, the valve is deployed while temporarily holding ventilation and during rapid ventricular pacing to maintain systolic blood pressure < 50 mmHg and pulse pressure < 10 mmHg. The balloon inflation is held for >3 seconds after reaching full deployment volume. Once the balloon has fully deflated the balloon is retracted into the ascending aorta and valve function is assessed using TTE. There is felt to be paravalvular leak and no central aortic insufficiency.  The patient's hemodynamic recovery following valve deployment is good.  The deployment balloon and guidewire are both removed. Echo demostrated acceptable post-procedural gradients, stable mitral valve function, and no AI.   PROCEDURE COMPLETION:  The sheath was then removed and closure devices were completed. Protamine was administered once femoral arterial repair was complete. The temporary pacemaker, pigtail catheters and femoral sheaths were removed with a Mynx closure device placed in the artery and manual pressure used for venous hemostasis.    The patient tolerated the procedure well and is transported to the surgical intensive care in stable condition. There were no immediate intraoperative complications. All sponge instrument and needle counts are verified correct at completion of the operation.   No blood products were administered during the operation.  The patient received a total of 40 mL  of intravenous contrast during the procedure.  LVEDP:  Not measured  [name] MD, [facility] 07/26/2022 5:22 PM

## 2022-07-27 ENCOUNTER — Encounter: Payer: Medicare Other | Admitting: Surgery

## 2022-07-27 ENCOUNTER — Inpatient Hospital Stay (HOSPITAL_COMMUNITY): Payer: Medicare Other

## 2022-07-27 DIAGNOSIS — I350 Nonrheumatic aortic (valve) stenosis: Secondary | ICD-10-CM

## 2022-07-27 DIAGNOSIS — Z952 Presence of prosthetic heart valve: Secondary | ICD-10-CM

## 2022-07-27 LAB — CBC
HCT: 37.5 % — ABNORMAL LOW (ref 39.0–52.0)
Hemoglobin: 12.9 g/dL — ABNORMAL LOW (ref 13.0–17.0)
MCH: 34.1 pg — ABNORMAL HIGH (ref 26.0–34.0)
MCHC: 34.4 g/dL (ref 30.0–36.0)
MCV: 99.2 fL (ref 80.0–100.0)
Platelets: 133 10*3/uL — ABNORMAL LOW (ref 150–400)
RBC: 3.78 MIL/uL — ABNORMAL LOW (ref 4.22–5.81)
RDW: 13.5 % (ref 11.5–15.5)
WBC: 12.7 10*3/uL — ABNORMAL HIGH (ref 4.0–10.5)
nRBC: 0 % (ref 0.0–0.2)

## 2022-07-27 LAB — BASIC METABOLIC PANEL
Anion gap: 7 (ref 5–15)
BUN: 22 mg/dL (ref 8–23)
CO2: 22 mmol/L (ref 22–32)
Calcium: 8.3 mg/dL — ABNORMAL LOW (ref 8.9–10.3)
Chloride: 111 mmol/L (ref 98–111)
Creatinine, Ser: 1 mg/dL (ref 0.61–1.24)
GFR, Estimated: >60 mL/min (ref >60)
Glucose, Bld: 116 mg/dL — ABNORMAL HIGH (ref 70–99)
Potassium: 4.2 mmol/L (ref 3.5–5.1)
Sodium: 140 mmol/L (ref 135–145)

## 2022-07-27 LAB — MAGNESIUM: Magnesium: 1.7 mg/dL (ref 1.7–2.4)

## 2022-07-27 LAB — GLUCOSE, CAPILLARY: Glucose-Capillary: 172 mg/dL — ABNORMAL HIGH (ref 70–99)

## 2022-07-27 MED ORDER — APIXABAN 5 MG PO TABS
5.0000 mg | ORAL_TABLET | Freq: Two times a day (BID) | ORAL | Status: DC
  Administered 2022-07-27: 5 mg via ORAL
  Filled 2022-07-27: qty 1

## 2022-07-27 MED ORDER — ALUM & MAG HYDROXIDE-SIMETH 200-200-20 MG/5ML PO SUSP
15.0000 mL | ORAL | Status: DC | PRN
  Administered 2022-07-27: 15 mL via ORAL
  Filled 2022-07-27: qty 30

## 2022-07-27 MED ORDER — MAGNESIUM SULFATE 2 GM/50ML IV SOLN
2.0000 g | Freq: Once | INTRAVENOUS | Status: AC
  Administered 2022-07-27: 2 g via INTRAVENOUS
  Filled 2022-07-27: qty 50

## 2022-07-27 MED ORDER — CHLORHEXIDINE GLUCONATE CLOTH 2 % EX PADS
6.0000 | MEDICATED_PAD | Freq: Every day | CUTANEOUS | Status: DC
  Administered 2022-07-27: 6 via TOPICAL

## 2022-07-27 NOTE — Progress Notes (Signed)
CARDIAC REHAB PHASE I             Pt oob to chair. He states my stomach hurts from my ulcer I have. Otherwise feeling good this morning. He was able to get in and out of bed with no problems. Post TAVR teaching including site care, restrictions, risk factors, heart health diet, exercise guidelines, and CRP2 reviewed with pt and family. All questions and concern addressed. Will refer to [facility] for CRP2. Pt back to bed for Echo moving well with no pain or SOB. Plan to discharge today.    [id number] Vanessa Barbara RN BSN  07-27-22 [phone]

## 2022-07-27 NOTE — Progress Notes (Signed)
  Echocardiogram 2D Echocardiogram has been performed.  Patrick Franco 07/27/2022, 11:37 AM

## 2022-07-27 NOTE — Anesthesia Postprocedure Evaluation (Signed)
Anesthesia Post Note  Patient: Patrick Franco  Procedure(s) Performed: Transcatheter Aortic Valve Replacement, Transfemoral using a 26 MM Edwards SAPIEN 3 Ultra (Chest) INTRAOPERATIVE TRANSTHORACIC ECHOCARDIOGRAM     Patient location during evaluation: PACU Anesthesia Type: MAC Level of consciousness: awake and alert Pain management: pain level controlled Vital Signs Assessment: post-procedure vital signs reviewed and stable Respiratory status: spontaneous breathing, nonlabored ventilation, respiratory function stable and patient connected to nasal cannula oxygen Cardiovascular status: stable and blood pressure returned to baseline Postop Assessment: no apparent nausea or vomiting Anesthetic complications: no   No notable events documented.  Last Vitals:  Vitals:   07/27/22 1100 07/27/22 1115  BP:    Pulse: 83   Resp: 14   Temp:  36.8 C  SpO2: 93%     Last Pain:  Vitals:   07/27/22 1115  TempSrc: Oral  PainSc:                  [place]

## 2022-07-28 ENCOUNTER — Telehealth: Payer: Self-pay | Admitting: Physician Assistant

## 2022-07-28 LAB — ECHOCARDIOGRAM COMPLETE
AR max vel: 2.47 cm2
AV Area VTI: 2.45 cm2
AV Area mean vel: 2.28 cm2
AV Mean grad: 5.5 mmHg
AV Peak grad: 10.5 mmHg
Ao pk vel: 1.62 m/s
Height: 70 in
S' Lateral: 3.3 cm
Weight: 3407.43 oz

## 2022-07-28 MED FILL — Magnesium Sulfate Inj 50%: INTRAMUSCULAR | Qty: 10 | Status: AC

## 2022-07-28 MED FILL — Heparin Sodium (Porcine) Inj 1000 Unit/ML: Qty: 1000 | Status: AC

## 2022-07-28 MED FILL — Potassium Chloride Inj 2 mEq/ML: INTRAVENOUS | Qty: 40 | Status: AC

## 2022-07-28 NOTE — Telephone Encounter (Signed)
  [facility] VALVE TEAM   Patient contacted regarding discharge from [facility] on 9/13  Patient understands to follow up with a structural heart APP on 9/22 at [place].  Patient understands discharge instructions? yes Patient understands medications and regimen? yes Patient understands to bring all medications to this visit? yes  Angelena Form PA-C  [facility]

## 2022-08-01 ENCOUNTER — Encounter (HOSPITAL_COMMUNITY): Payer: Self-pay | Admitting: Cardiovascular Disease

## 2022-08-04 NOTE — Progress Notes (Addendum)
HEART AND [place]                                     Cardiology Office Note:    Date:  08/05/2022   ID:  Patrick Franco, DOB 1932-06-14, MRN [id number]  PCP:  Lavone Orn, MD  [facility] Cardiologist:  [name], MD  [facility] Electrophysiologist:   Grayer, MD   Referring MD: Lavone Orn, MD   [facility] s/p TAVR  History of Present Illness:    Patrick Franco is a 88 y.o. male with a hx of CAD, HTN, mitral valve prolapse, chronic diastolic CHF, PAF on Eliquis, sleep apnea and severe aortic stenosis s/p TAVR (07/26/22) who presents to clinic for follow up.   He had an echocardiogram 09/2016 which showed an LVEF at 78-67%, grade 1 diastolic dysfunction, functionally bicuspid aortic valve with moderate stenosis (mean gradient 21 mmHg, AVA 1.4cm2) with mild mitral regurgitation. He was referred to Dr. Angelena Form to follow his AS. Repeat echos over the years showed mild progression with most recent from 04/2022 showing a normal LVEF at 60-65%, and worsening AS with a mean gradient at 38.42mHg, peak 62.348mg, AVA by VTI at 1.0cm2, and DI at 0.26. At that time he was having progression of his symptoms as well. He underwent R/[facility]/13/23 which showed a heavily calcified LAD lesion with moderate proximal and mid disease.There appears to be a completely occluded branch just beyond this stenosis and then the LAD continues to the apex. Circumflex has a moderate mid stenosis. RCA is a large dominant vessel with heavy calcification throughout. Mild plaque in the proximal and mid vessel. Recommendations were to continue TAVR workup and treat his CAD medically. After his TAVR, if he continues to have dyspnea on exertion, can then consider complex PCI of the mid to distal LAD with orbital atherectomy and stenting.    He was evaluated by the multidisciplinary valve team and underwent successful TAVR with a 26 mm Edwards Sapien 3 THV via the TF approach  on 07/26/22. Post operative echo showed EF 65%, normally functioning TAVR with a mean gradient of 5.5 mm hg and no PVL. He was restarted on Eliquis.  Today the patient presents to clinic for follow up. Hasnt been sleepign well. Doesn't have a lot of engergy. Has shortness of breath and fatigue. Had one good day but then back to feeling SOB/fatigued. Has not been exercising the way he used to. No CP. No LE edema, orthopnea or PND. No dizziness or syncope. No blood in stool or urine. No palpitations.    Past Medical History:  Diagnosis Date   Aortic stenosis    Arthritis    Atrial fibrillation (HCC)    BPH (benign prostatic hyperplasia)    Chronic low back pain    Depression    Diverticulitis    Dyspnea    Elevated PSA    Foot drop, right    Gait disorder 02/03/2015   Hip pain    History of colonoscopy    History of nuclear stress test    Myoview 10/22: EF 67, no ischemia or infarction; low risk   HOH (hard of hearing)    hearing aid   Hypertension    Lumbar radiculopathy    Mitral valve prolapse    Onychomycosis    Peptic ulcer disease    S/P TAVR (transcatheter aortic valve  replacement) 07/26/2022   40m S3UR via TF approach with Dr. MAngelena Formand Dr. BRandolm Idol  Sepsis [facility]   e. coli-after prostate bx   Sleep apnea    Torn ACL    Tracheobronchomalacia    seen on 2019 Chest CT    Past Surgical History:  Procedure Laterality Date   BIOPSY  10/05/2021   Procedure: BIOPSY;  Surgeon: KRonnette Juniper MD;  [facility];  Service: Gastroenterology;;   CARDIOVERSION N/A 03/27/2020   Procedure: CARDIOVERSION;  Surgeon: RSkeet Latch MD;  [facility]  Service: Cardiovascular;  Laterality: N/A;   CATARACT EXTRACTION Bilateral    ESOPHAGOGASTRODUODENOSCOPY N/A 08/19/2015   Procedure: ESOPHAGOGASTRODUODENOSCOPY (EGD);  Surgeon: JLaurence Spates MD;  [facility];  Service: Endoscopy;  Laterality: N/A;   ESOPHAGOGASTRODUODENOSCOPY N/A  10/05/2021   Procedure: ESOPHAGOGASTRODUODENOSCOPY (EGD);  Surgeon: KRonnette Juniper MD;  [facility];  Service: Gastroenterology;  Laterality: N/A;   INTRAOPERATIVE TRANSTHORACIC ECHOCARDIOGRAM N/A 07/26/2022   Procedure: INTRAOPERATIVE TRANSTHORACIC ECHOCARDIOGRAM;  Surgeon: MBurnell Blanks MD;  [place] [facility]  Service: Open Heart Surgery;  Laterality: N/A;   right knee replacement Right 1996   RIGHT/LEFT HEART CATH AND CORONARY ANGIOGRAPHY N/A 05/26/2022   Procedure: RIGHT/LEFT HEART CATH AND CORONARY ANGIOGRAPHY;  Surgeon: MBurnell Blanks MD;  [facility];  Service: Cardiovascular;  Laterality: N/A;   SHOULDER SURGERY     TEE WITHOUT CARDIOVERSION N/A 03/27/2020   Procedure: TRANSESOPHAGEAL ECHOCARDIOGRAM (TEE);  Surgeon: RSkeet Latch MD;  [place] [facility]  Service: Cardiovascular;  Laterality: N/A;   TONSILLECTOMY     TOTAL HIP ARTHROPLASTY Right 08/13/2019   Procedure: RIGHT TOTAL HIP ARTHROPLASTY ANTERIOR APPROACH;  Surgeon: BMcarthur Rossetti MD;  [place] [facility]  Service: Orthopedics;  Laterality: Right;   TRANSCATHETER AORTIC VALVE REPLACEMENT, TRANSFEMORAL N/A 07/26/2022   Procedure: Transcatheter Aortic Valve Replacement, Transfemoral using a 26 MM Edwards SAPIEN 3 Ultra;  Surgeon: MBurnell Blanks MD;  [place] [facility]  Service: Open Heart Surgery;  Laterality: N/A;  Transfemoral approach    Current Medications: Current Meds  Medication Sig   allopurinol (ZYLOPRIM) 100 MG tablet Take 2 tablets (200 mg total) by mouth daily.   amoxicillin (AMOXIL) 500 MG tablet Take 4 tablets (2,000 mg total) by mouth as directed. 1 hour prior to dental work including cleanings   apixaban (ELIQUIS) 5 MG TABS tablet Take 1 tablet (5 mg total) by mouth 2 (two) times daily.   atorvastatin (LIPITOR) 10 MG tablet Take 10 mg by mouth every morning.   diltiazem (CARDIZEM CD) 240 MG 24 hr capsule TAKE (1) CAPSULE DAILY.   dofetilide  (TIKOSYN) 500 MCG capsule TAKE ONE CAPSULE BY MOUTH TWICE DAILY   dutasteride (AVODART) 0.5 MG capsule Take 0.5 mg by mouth every morning.   furosemide (LASIX) 40 MG tablet Take 60 mg by mouth 4 (four) times a week.   metoprolol tartrate (LOPRESSOR) 25 MG tablet Take 1 tablet (25 mg total) by mouth daily.   mirtazapine (REMERON) 30 MG tablet TAKE ONE TABLET AT BEDTIME.   Multiple Vitamin (MULTIVITAMIN) tablet Take 1 tablet by mouth daily.   Omega-3 Fatty Acids (FISH OIL) 1200 MG CAPS Take 1,200 mg by mouth daily.   pantoprazole (PROTONIX) 40 MG tablet Take 1 tablet (40 mg total) by mouth daily.   Polyethyl Glycol-Propyl Glycol (SYSTANE) 0.4-0.3 % SOLN Place 1 drop into both eyes 2 (two) times daily.   potassium chloride SA (KLOR-CON) 20 MEQ tablet TAKE 1 TABLET  BY MOUTH IN THE MORNING, TAKE 1/2 TABLET BY MOUTH IN THE P.M.   Probiotic Product (PROBIOTIC DAILY PO) Take 1 tablet by mouth daily.   pyridOXINE (VITAMIN B-6) 100 MG tablet Take 100 mg by mouth daily.   Respiratory Therapy Supplies (FLUTTER) DEVI Use the flutter valve 10 times when doing it daily.   spironolactone (ALDACTONE) 25 MG tablet Take 0.5 tablets (12.5 mg total) by mouth daily.   tamsulosin (FLOMAX) 0.4 MG CAPS capsule Take 0.4 mg by mouth daily after breakfast.   Turmeric 500 MG CAPS Take 500 mg by mouth daily.   vitamin B-12 (CYANOCOBALAMIN) 1000 MCG tablet Take 1,000 mcg by mouth daily.     Allergies:   Patient has no known allergies.   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   Patient drinks 1 cup of coffee per day.   Social Determinants of Health   Financial Resource Strain: Not on file  Food Insecurity: Not on file  Transportation Needs: Not on file  Physical Activity: Not on file  Stress: Not on file  Social Connections: Not on file     Family History: The patient's family history includes Cancer - Lung (age of onset: 550 in his father; Multiple myeloma (age of onset: 553 in his brother; Pneumonia (age of onset: 857 in his mother.  ROS:   Please see the history of present illness.    All other systems reviewed and are negative.  EKGs/Labs/Other Studies Reviewed:    The following studies were reviewed today:  TAVR OPERATIVE NOTE     Date of Procedure:                07/26/2022   Preoperative Diagnosis:      Severe Aortic Stenosis    Postoperative Diagnosis:    Same    Procedure:        Transcatheter Aortic Valve Replacement - Percutaneous Left Transfemoral Approach             Edwards Sapien 3 Ultra THV (size 26 mm, model # 9755RSL, serial # [id number]              Co-Surgeons:                        PCoralie CommonMD and CLauree Chandler/     Anesthesiologist:  Albertha Ghee MD   Echocardiographer:              Eleonore Chiquito   Pre-operative Echo Findings: Severe aortic stenosis Normal left ventricular systolic function   Post-operative Echo Findings: no paravalvular leak Normal left ventricular systolic function  ____________    Echo 07/27/22: IMPRESSIONS   1. 26 mm S3. Vmax 1.6 m/s, MG 5.5 mmHG, EOA 2.45, DI 0.57. No  regurgitation or  paravalvular leak. Normal prosthesis. The aortic valve  has been repaired/replaced. Aortic valve regurgitation is not visualized.  There is a 26 mm Edwards Ultra, stented  (TAVR) valve present in the aortic position. Procedure Date: 07/26/22.   2. Left ventricular ejection fraction, by estimation, is 65 to 70%. The  left ventricle has normal function. The left ventricle has no regional  wall motion abnormalities. There is mild concentric left ventricular  hypertrophy. Left ventricular diastolic  function could not be evaluated.   3. Right ventricular systolic function is normal. The right ventricular  size is normal. There is mildly elevated pulmonary artery systolic  pressure. The estimated right ventricular systolic pressure is 05.6 mmHg.   4. Left atrial size was severely dilated.   5. The mitral valve is degenerative. Mild mitral valve regurgitation.  Mild to moderate mitral stenosis. Severe mitral annular calcification.   6. The inferior vena cava is normal in size with greater than 50%  respiratory variability, suggesting right atrial pressure of 3 mmHg.    EKG:  EKG is ordered today.  The ekg ordered today demonstrates sinus with 1 st deg AV block HR 72  Recent Labs: 07/22/2022: ALT 20 07/27/2022: BUN 22; Creatinine, Ser 1.00; Hemoglobin 12.9; Magnesium 1.7; Platelets 133; Potassium 4.2; Sodium 140  Recent Lipid Panel    Component Value Date/Time   CHOL 96 03/06/2020 0330   TRIG 50 03/06/2020 0330   HDL 48 03/06/2020 0330   CHOLHDL 2.0 03/06/2020 0330   VLDL 10 03/06/2020 0330   LDLCALC 38 03/06/2020 0330     Risk Assessment/Calculations:    CHA2DS2-VASc Score = 5   This indicates a 7.2% annual risk of stroke. The patient's score is based upon: CHF History: 1 HTN History: 1 Diabetes History: 0 Stroke History: 0 Vascular Disease History: 1 Age Score: 2 Gender Score: 0      Physical Exam:    VS:  BP 112/60   Pulse 73   Ht 5' 9.5" (1.765 m)   Wt 212 lb (96.2  kg)   SpO2 94%   BMI 30.86 kg/m     Wt Readings from Last 3 Encounters:  08/05/22 212 lb (96.2 kg)  07/27/22 212 lb 15.4 oz (96.6 kg)  07/22/22 212 lb 9.6 oz (96.4 kg)     GEN:  Well nourished, well developed in no acute distress HEENT: Normal NECK: No JVD LYMPHATICS: No lymphadenopathy CARDIAC: RRR, soft flow murmur @ RUSB and holosystolic murmur at apex. No rubs, gallops RESPIRATORY:  Clear to auscultation without rales, wheezing or rhonchi  ABDOMEN: Soft, non-tender, non-distended MUSCULOSKELETAL:  No edema; No deformity  SKIN: Warm and dry. Diffuse ecchymosis on groins NEUROLOGIC:  Alert and oriented x 3 PSYCHIATRIC:  Normal affect   ASSESSMENT:    1. S/P TAVR (transcatheter aortic valve replacement)   2. Coronary artery disease involving native coronary artery of native heart without angina pectoris   3. PAF (paroxysmal atrial fibrillation) ([place])   4. Chronic heart failure with preserved ejection fraction (HCC)   5. Mitral valve prolapse  PLAN:    In order of problems listed above:  Severe AS s/p TAVR: doing okay 1 week out from TAVR. Still having some SOB and fatigue. Encouraged exercise and participating in cardiac rehab. ECG with no HAVB. Groin sites with diffuse ecchymosis but healing well. SBE prophylaxis discussed; I have RX'd amoxicillin. Continue on Eliquis. I will see him back next month for follow up and echo.   CAD: he underwent [facility] 05/26/22 which showed a heavily calcified LAD lesion with moderate proximal and mid disease.There appears to be a completely occluded branch just beyond this stenosis and then the LAD continues to the apex. Circumflex has a moderate mid stenosis. RCA is a large dominant vessel with heavy calcification throughout. Mild plaque in the proximal and mid vessel. Recommendations were to continue TAVR workup and treat his CAD medically. After his TAVR, if he continues to have dyspnea on exertion, can then consider complex PCI of the mid to  distal LAD with orbital atherectomy and stenting. No ASA given Eliquis.    PAF: maintaining sinus rhythm. Continue dofetilide 500 mcg twice daily. QTC is stable. Continue Eliquis    HTN: BP well controlled. No changes made.    Chronic diastolic CHF: appears euvolemic on exam today.  Coontnue as need lasix.    Mitral valve prolapse: continue to follow with surveillence echocardiograms.      Cardiac Rehabilitation Eligibility Assessment  The patient is ready to start cardiac rehabilitation from a cardiac standpoint.         Medication Adjustments/Labs and Tests Ordered: Current medicines are reviewed at length with the patient today.  Concerns regarding medicines are outlined above.  Orders Placed This Encounter  Procedures   EKG 12-Lead   Meds ordered this encounter  Medications   amoxicillin (AMOXIL) 500 MG tablet    Sig: Take 4 tablets (2,000 mg total) by mouth as directed. 1 hour prior to dental work including cleanings    Dispense:  12 tablet    Refill:  12    Order Specific Question:   Supervising Provider    Answer:   [place], [place]    Patient Instructions  Medication Instructions:  Your physician recommends that you continue on your current medications as directed. Please refer to the Current Medication list given to you today.  *If you need a refill on your cardiac medications before your next appointment, please call your pharmacy*  Lab Work: If you have labs (blood work) drawn today and your tests are completely normal, you will receive your results only by: [place] (if you have MyChart) OR A paper copy in the mail If you have any lab test that is abnormal or we need to change your treatment, we will call you to review the results.  Testing/Procedures: None ordered today .  Follow-Up: At [facility], you and your health needs are our priority.  As part of our continuing mission to provide you with exceptional heart care, we have  created designated Provider Care Teams.  These Care Teams include your primary Cardiologist (physician) and Advanced Practice Providers (APPs -  Physician Assistants and Nurse Practitioners) who all work together to provide you with the care you need, when you need it.  We recommend signing up for the patient portal called "MyChart".  Sign up information is provided on this After Visit Summary.  MyChart is used to connect with patients for Virtual Visits (Telemedicine).  Patients are able to view lab/test results, encounter notes, upcoming appointments, etc.  Non-urgent messages can be sent to your provider as well.   To learn more about what you can do with MyChart, go to [email or web address].    Your next appointment:   09/02/22  The format for your next appointment:   In Person  Provider:   Kathyrn Drown, NP or Nell Range, PA-C     Important Information About Sugar         Signed, Angelena Form, PA-C  08/05/2022 12:20 PM    [place]

## 2022-08-05 ENCOUNTER — Ambulatory Visit: Payer: Medicare Other | Attending: Cardiology | Admitting: Physician Assistant

## 2022-08-05 ENCOUNTER — Ambulatory Visit: Payer: Medicare Other

## 2022-08-05 VITALS — BP 112/60 | HR 73 | Ht 69.5 in | Wt 212.0 lb

## 2022-08-05 DIAGNOSIS — I2510 Atherosclerotic heart disease of native coronary artery without angina pectoris: Secondary | ICD-10-CM | POA: Diagnosis not present

## 2022-08-05 DIAGNOSIS — I5032 Chronic diastolic (congestive) heart failure: Secondary | ICD-10-CM | POA: Insufficient documentation

## 2022-08-05 DIAGNOSIS — I341 Nonrheumatic mitral (valve) prolapse: Secondary | ICD-10-CM | POA: Diagnosis not present

## 2022-08-05 DIAGNOSIS — Z952 Presence of prosthetic heart valve: Secondary | ICD-10-CM | POA: Insufficient documentation

## 2022-08-05 DIAGNOSIS — I480 Paroxysmal atrial fibrillation: Secondary | ICD-10-CM | POA: Diagnosis not present

## 2022-08-05 MED ORDER — AMOXICILLIN 500 MG PO TABS: 2000.0000 mg | ORAL_TABLET | ORAL | 12 refills | Status: DC

## 2022-08-05 NOTE — Patient Instructions (Signed)
Medication Instructions:  Your physician recommends that you continue on your current medications as directed. Please refer to the Current Medication list given to you today.  *If you need a refill on your cardiac medications before your next appointment, please call your pharmacy*  Lab Work: If you have labs (blood work) drawn today and your tests are completely normal, you will receive your results only by: [place] (if you have MyChart) OR A paper copy in the mail If you have any lab test that is abnormal or we need to change your treatment, we will call you to review the results.  Testing/Procedures: None ordered today .  Follow-Up: At [facility], you and your health needs are our priority.  As part of our continuing mission to provide you with exceptional heart care, we have created designated Provider Care Teams.  These Care Teams include your primary Cardiologist (physician) and Advanced Practice Providers (APPs -  Physician Assistants and Nurse Practitioners) who all work together to provide you with the care you need, when you need it.  We recommend signing up for the patient portal called "MyChart".  Sign up information is provided on this After Visit Summary.  MyChart is used to connect with patients for Virtual Visits (Telemedicine).  Patients are able to view lab/test results, encounter notes, upcoming appointments, etc.  Non-urgent messages can be sent to your provider as well.   To learn more about what you can do with MyChart, go to [email or web address].    Your next appointment:   09/02/22  The format for your next appointment:   In Person  Provider:   Kathyrn Drown, NP or Nell Range, PA-C     Important Information About Sugar

## 2022-08-08 ENCOUNTER — Telehealth (HOSPITAL_COMMUNITY): Payer: Self-pay

## 2022-08-08 NOTE — Telephone Encounter (Signed)
Pt insurance is active and benefits verified through Medicare A/B. Co-pay $0.00, DED $226.00/$226.00 met, out of pocket $0.00/$0.00 met, co-insurance 20%. No pre-authorization required. Passport, 08/08/22 @ 2:47PM, JNG#[id number]   How many CR sessions are covered? (36 sessions for TCR, 72 sessions for ICR)72 Is this a lifetime maximum or an annual maximum? No Has the member used any of these services to date? 0 Is there a time limit (weeks/months) on start of program and/or program completion? No   2ndary insurance is active and benefits verified through [employer]. Co-pay $0.00, DED $0.00/$0.00 met, out of pocket $0.00/$0.00 met, co-insurance 0%. No pre-authorization required. Passport, 08/08/22 @ 2:49PM, PCW#[id number]      Will contact patient to see if he is interested in the Cardiac Rehab Program. If interested, patient will need to complete follow up appt. Once completed, patient will be contacted for scheduling upon review by the RN Navigator.

## 2022-08-08 NOTE — Telephone Encounter (Signed)
Attempted to call patient in regards to Cardiac Rehab - LM on VM 

## 2022-08-16 ENCOUNTER — Other Ambulatory Visit (HOSPITAL_BASED_OUTPATIENT_CLINIC_OR_DEPARTMENT_OTHER): Payer: Self-pay

## 2022-08-16 ENCOUNTER — Telehealth (HOSPITAL_COMMUNITY): Payer: Self-pay | Admitting: *Deleted

## 2022-08-16 DIAGNOSIS — Z23 Encounter for immunization: Secondary | ICD-10-CM | POA: Diagnosis not present

## 2022-08-16 MED ORDER — FLUAD QUADRIVALENT 0.5 ML IM PRSY
PREFILLED_SYRINGE | INTRAMUSCULAR | 0 refills | Status: DC
  Filled 2022-08-16: qty 0.5, 1d supply, fill #0

## 2022-08-16 NOTE — Telephone Encounter (Signed)
Returned call from message left on departmental voicemail.  Pt was out of town and this was his first chance to return the call.  Reviewed with pt his financial responsibility he would have for cardiac rehab.  Reviewed generalities of the program and where we are with scheduling as well as options for 2 x week verses 3.  Pt is already exercising at [facility] where he resides.  Jensen is doing water aerobics 2 x week, works with Physiological scientist 2 a week. Pt would like to remain on the wait list while the thinks about it also has an upcoming appt on 10/20 in the structural heart clinic.  Pt already on the wait list, will contact him again at his turn for scheduling. Cherre Huger, BSN Cardiac and Training and development officer

## 2022-08-23 ENCOUNTER — Inpatient Hospital Stay (HOSPITAL_COMMUNITY)
Admission: EM | Admit: 2022-08-23 | Discharge: 2022-08-26 | DRG: 354 | Disposition: A | Discharge status: Home / Self Care | Payer: Medicare Other | Source: Ambulatory Visit | Attending: General Surgery | Admitting: General Surgery

## 2022-08-23 ENCOUNTER — Emergency Department (HOSPITAL_COMMUNITY): Payer: Medicare Other

## 2022-08-23 ENCOUNTER — Encounter (HOSPITAL_COMMUNITY): Payer: Self-pay

## 2022-08-23 DIAGNOSIS — Z79899 Other long term (current) drug therapy: Secondary | ICD-10-CM

## 2022-08-23 DIAGNOSIS — I4819 Other persistent atrial fibrillation: Secondary | ICD-10-CM | POA: Diagnosis not present

## 2022-08-23 DIAGNOSIS — E785 Hyperlipidemia, unspecified: Secondary | ICD-10-CM | POA: Diagnosis present

## 2022-08-23 DIAGNOSIS — Z87891 Personal history of nicotine dependence: Secondary | ICD-10-CM | POA: Diagnosis not present

## 2022-08-23 DIAGNOSIS — N400 Enlarged prostate without lower urinary tract symptoms: Secondary | ICD-10-CM | POA: Diagnosis present

## 2022-08-23 DIAGNOSIS — I472 Ventricular tachycardia, unspecified: Secondary | ICD-10-CM | POA: Diagnosis present

## 2022-08-23 DIAGNOSIS — D649 Anemia, unspecified: Secondary | ICD-10-CM | POA: Diagnosis not present

## 2022-08-23 DIAGNOSIS — I4892 Unspecified atrial flutter: Secondary | ICD-10-CM | POA: Diagnosis present

## 2022-08-23 DIAGNOSIS — I350 Nonrheumatic aortic (valve) stenosis: Secondary | ICD-10-CM | POA: Diagnosis present

## 2022-08-23 DIAGNOSIS — I2510 Atherosclerotic heart disease of native coronary artery without angina pectoris: Secondary | ICD-10-CM | POA: Diagnosis not present

## 2022-08-23 DIAGNOSIS — Z96651 Presence of right artificial knee joint: Secondary | ICD-10-CM | POA: Diagnosis present

## 2022-08-23 DIAGNOSIS — G4733 Obstructive sleep apnea (adult) (pediatric): Secondary | ICD-10-CM | POA: Diagnosis not present

## 2022-08-23 DIAGNOSIS — Z7901 Long term (current) use of anticoagulants: Secondary | ICD-10-CM

## 2022-08-23 DIAGNOSIS — K420 Umbilical hernia with obstruction, without gangrene: Principal | ICD-10-CM | POA: Diagnosis present

## 2022-08-23 DIAGNOSIS — I5032 Chronic diastolic (congestive) heart failure: Secondary | ICD-10-CM | POA: Diagnosis not present

## 2022-08-23 DIAGNOSIS — K259 Gastric ulcer, unspecified as acute or chronic, without hemorrhage or perforation: Secondary | ICD-10-CM | POA: Diagnosis not present

## 2022-08-23 DIAGNOSIS — Z953 Presence of xenogenic heart valve: Secondary | ICD-10-CM | POA: Diagnosis not present

## 2022-08-23 DIAGNOSIS — Z807 Family history of other malignant neoplasms of lymphoid, hematopoietic and related tissues: Secondary | ICD-10-CM | POA: Diagnosis not present

## 2022-08-23 DIAGNOSIS — I110 Hypertensive heart disease with heart failure: Secondary | ICD-10-CM | POA: Diagnosis present

## 2022-08-23 DIAGNOSIS — I4891 Unspecified atrial fibrillation: Secondary | ICD-10-CM | POA: Diagnosis not present

## 2022-08-23 DIAGNOSIS — K5730 Diverticulosis of large intestine without perforation or abscess without bleeding: Secondary | ICD-10-CM | POA: Diagnosis not present

## 2022-08-23 DIAGNOSIS — N281 Cyst of kidney, acquired: Secondary | ICD-10-CM | POA: Diagnosis not present

## 2022-08-23 DIAGNOSIS — R1084 Generalized abdominal pain: Secondary | ICD-10-CM | POA: Diagnosis not present

## 2022-08-23 DIAGNOSIS — I493 Ventricular premature depolarization: Secondary | ICD-10-CM | POA: Diagnosis present

## 2022-08-23 DIAGNOSIS — K5669 Other partial intestinal obstruction: Secondary | ICD-10-CM | POA: Diagnosis not present

## 2022-08-23 DIAGNOSIS — Z01810 Encounter for preprocedural cardiovascular examination: Secondary | ICD-10-CM | POA: Diagnosis not present

## 2022-08-23 DIAGNOSIS — I10 Essential (primary) hypertension: Secondary | ICD-10-CM | POA: Diagnosis not present

## 2022-08-23 DIAGNOSIS — K429 Umbilical hernia without obstruction or gangrene: Secondary | ICD-10-CM | POA: Diagnosis not present

## 2022-08-23 DIAGNOSIS — I341 Nonrheumatic mitral (valve) prolapse: Secondary | ICD-10-CM | POA: Diagnosis present

## 2022-08-23 DIAGNOSIS — F32A Depression, unspecified: Secondary | ICD-10-CM | POA: Diagnosis present

## 2022-08-23 LAB — URINALYSIS, ROUTINE W REFLEX MICROSCOPIC
Bilirubin Urine: NEGATIVE
Glucose, UA: NEGATIVE mg/dL
Hgb urine dipstick: NEGATIVE
Ketones, ur: 5 mg/dL — AB
Leukocytes,Ua: NEGATIVE
Nitrite: NEGATIVE
Protein, ur: 100 mg/dL — AB
Specific Gravity, Urine: 1.026 (ref 1.005–1.030)
pH: 5 (ref 5.0–8.0)

## 2022-08-23 LAB — CBC
HCT: 48.4 % (ref 39.0–52.0)
Hemoglobin: 16.2 g/dL (ref 13.0–17.0)
MCH: 34 pg (ref 26.0–34.0)
MCHC: 33.5 g/dL (ref 30.0–36.0)
MCV: 101.5 fL — ABNORMAL HIGH (ref 80.0–100.0)
Platelets: 205 10*3/uL (ref 150–400)
RBC: 4.77 MIL/uL (ref 4.22–5.81)
RDW: 14.2 % (ref 11.5–15.5)
WBC: 11.3 10*3/uL — ABNORMAL HIGH (ref 4.0–10.5)
nRBC: 0 % (ref 0.0–0.2)

## 2022-08-23 LAB — COMPREHENSIVE METABOLIC PANEL
ALT: 22 U/L (ref 0–44)
AST: 26 U/L (ref 15–41)
Albumin: 4.6 g/dL (ref 3.5–5.0)
Alkaline Phosphatase: 68 U/L (ref 38–126)
Anion gap: 11 (ref 5–15)
BUN: 26 mg/dL — ABNORMAL HIGH (ref 8–23)
CO2: 24 mmol/L (ref 22–32)
Calcium: 9.8 mg/dL (ref 8.9–10.3)
Chloride: 104 mmol/L (ref 98–111)
Creatinine, Ser: 1.09 mg/dL (ref 0.61–1.24)
GFR, Estimated: >60 mL/min (ref >60)
Glucose, Bld: 137 mg/dL — ABNORMAL HIGH (ref 70–99)
Potassium: 4.5 mmol/L (ref 3.5–5.1)
Sodium: 139 mmol/L (ref 135–145)
Total Bilirubin: 2.3 mg/dL — ABNORMAL HIGH (ref 0.3–1.2)
Total Protein: 7.5 g/dL (ref 6.5–8.1)

## 2022-08-23 LAB — LIPASE, BLOOD: Lipase: 33 U/L (ref 11–51)

## 2022-08-23 MED ORDER — SPIRONOLACTONE 12.5 MG HALF TABLET
12.5000 mg | ORAL_TABLET | Freq: Every day | ORAL | Status: DC
  Administered 2022-08-24 – 2022-08-25 (×2): 12.5 mg via ORAL
  Filled 2022-08-23 (×4): qty 1

## 2022-08-23 MED ORDER — TAMSULOSIN HCL 0.4 MG PO CAPS
0.4000 mg | ORAL_CAPSULE | Freq: Every day | ORAL | Status: DC
  Administered 2022-08-24 – 2022-08-25 (×2): 0.4 mg via ORAL
  Filled 2022-08-23 (×2): qty 1

## 2022-08-23 MED ORDER — METHOCARBAMOL 1000 MG/10ML IJ SOLN: 500.0000 mg | Freq: Three times a day (TID) | INTRAVENOUS | Status: DC | PRN

## 2022-08-23 MED ORDER — MORPHINE SULFATE (PF) 2 MG/ML IV SOLN: 1.0000 mg | INTRAVENOUS | Status: DC | PRN

## 2022-08-23 MED ORDER — METOPROLOL TARTRATE 25 MG PO TABS
25.0000 mg | ORAL_TABLET | Freq: Every day | ORAL | Status: DC
  Administered 2022-08-24: 25 mg via ORAL
  Filled 2022-08-23 (×2): qty 1

## 2022-08-23 MED ORDER — METOPROLOL TARTRATE 5 MG/5ML IV SOLN: 5.0000 mg | Freq: Four times a day (QID) | INTRAVENOUS | Status: DC | PRN

## 2022-08-23 MED ORDER — IOHEXOL 300 MG/ML  SOLN
100.0000 mL | Freq: Once | INTRAMUSCULAR | Status: AC | PRN
  Administered 2022-08-23: 100 mL via INTRAVENOUS

## 2022-08-23 MED ORDER — DUTASTERIDE 0.5 MG PO CAPS
0.5000 mg | ORAL_CAPSULE | Freq: Every morning | ORAL | Status: DC
  Administered 2022-08-25: 0.5 mg via ORAL
  Filled 2022-08-23: qty 1

## 2022-08-23 MED ORDER — PANTOPRAZOLE SODIUM 40 MG PO TBEC
40.0000 mg | DELAYED_RELEASE_TABLET | Freq: Every day | ORAL | Status: DC
  Administered 2022-08-24 – 2022-08-25 (×2): 40 mg via ORAL
  Filled 2022-08-23 (×2): qty 1

## 2022-08-23 MED ORDER — ACETAMINOPHEN 325 MG PO TABS: 650.0000 mg | ORAL_TABLET | Freq: Four times a day (QID) | ORAL | Status: DC | PRN

## 2022-08-23 MED ORDER — ACETAMINOPHEN 650 MG RE SUPP: 650.0000 mg | Freq: Four times a day (QID) | RECTAL | Status: DC | PRN

## 2022-08-23 MED ORDER — METHOCARBAMOL 500 MG PO TABS: 500.0000 mg | ORAL_TABLET | Freq: Three times a day (TID) | ORAL | Status: DC | PRN

## 2022-08-23 MED ORDER — CEFAZOLIN SODIUM-DEXTROSE 2-4 GM/100ML-% IV SOLN
2.0000 g | INTRAVENOUS | Status: DC
  Filled 2022-08-23: qty 100

## 2022-08-23 MED ORDER — ONDANSETRON 4 MG PO TBDP: 4.0000 mg | ORAL_TABLET | Freq: Four times a day (QID) | ORAL | Status: DC | PRN

## 2022-08-23 MED ORDER — DOCUSATE SODIUM 100 MG PO CAPS
100.0000 mg | ORAL_CAPSULE | Freq: Two times a day (BID) | ORAL | Status: DC
  Administered 2022-08-24 – 2022-08-25 (×4): 100 mg via ORAL
  Filled 2022-08-23 (×5): qty 1

## 2022-08-23 MED ORDER — LACTATED RINGERS IV SOLN: INTRAVENOUS | Status: DC

## 2022-08-23 MED ORDER — DOFETILIDE 250 MCG PO CAPS
500.0000 ug | ORAL_CAPSULE | Freq: Two times a day (BID) | ORAL | Status: DC
  Filled 2022-08-23 (×2): qty 2

## 2022-08-23 MED ORDER — POLYETHYLENE GLYCOL 3350 17 G PO PACK: 17.0000 g | PACK | Freq: Every day | ORAL | Status: DC | PRN

## 2022-08-23 MED ORDER — DIPHENHYDRAMINE HCL 50 MG/ML IJ SOLN: 12.5000 mg | Freq: Four times a day (QID) | INTRAMUSCULAR | Status: DC | PRN

## 2022-08-23 MED ORDER — DILTIAZEM HCL ER COATED BEADS 120 MG PO CP24
240.0000 mg | ORAL_CAPSULE | Freq: Every day | ORAL | Status: DC
  Administered 2022-08-24 – 2022-08-25 (×2): 240 mg via ORAL
  Filled 2022-08-23 (×3): qty 2

## 2022-08-23 MED ORDER — DIPHENHYDRAMINE HCL 12.5 MG/5ML PO ELIX: 12.5000 mg | ORAL_SOLUTION | Freq: Four times a day (QID) | ORAL | Status: DC | PRN

## 2022-08-23 MED ORDER — ALLOPURINOL 100 MG PO TABS
200.0000 mg | ORAL_TABLET | Freq: Every day | ORAL | Status: DC
  Administered 2022-08-24 – 2022-08-25 (×2): 200 mg via ORAL
  Filled 2022-08-23 (×3): qty 2

## 2022-08-23 MED ORDER — SIMETHICONE 80 MG PO CHEW: 40.0000 mg | CHEWABLE_TABLET | Freq: Four times a day (QID) | ORAL | Status: DC | PRN

## 2022-08-23 MED ORDER — ONDANSETRON HCL 4 MG/2ML IJ SOLN: 4.0000 mg | Freq: Four times a day (QID) | INTRAMUSCULAR | Status: DC | PRN

## 2022-08-23 MED ORDER — OXYCODONE HCL 5 MG PO TABS: 2.5000 mg | ORAL_TABLET | ORAL | Status: DC | PRN

## 2022-08-23 MED ORDER — ATORVASTATIN CALCIUM 10 MG PO TABS
10.0000 mg | ORAL_TABLET | Freq: Every morning | ORAL | Status: DC
  Administered 2022-08-24 – 2022-08-25 (×2): 10 mg via ORAL
  Filled 2022-08-23 (×2): qty 1

## 2022-08-23 MED ORDER — MIRTAZAPINE 15 MG PO TABS
30.0000 mg | ORAL_TABLET | Freq: Every day | ORAL | Status: DC
  Administered 2022-08-24 – 2022-08-25 (×2): 30 mg via ORAL
  Filled 2022-08-23 (×3): qty 2

## 2022-08-23 NOTE — ED Notes (Signed)
Pt coming POV from [place] due to hernia pain? Pt has stomach cramps/vomiting since Saturday.

## 2022-08-23 NOTE — ED Triage Notes (Signed)
Pt arrived via POV, c/o abd pain, n/v, has not been able to keep any food down.

## 2022-08-23 NOTE — ED Provider Triage Note (Signed)
Emergency Medicine Provider Triage Evaluation Note  ELFORD EVILSIZER , a 88 y.o. male  was evaluated in triage.  Patient sent over by PCP due to abdominal pain, nausea and vomiting since Saturday.  Has a history of umbilical hernia.  Says his abdomen is very distended, still having bowel movements, most recent 1 yesterday morning.  On any opioid medications.  Says he is vomiting any tries to drink.  Review of Systems  Positive: Abdominal pain, nausea and vomiting Negative: Diarrhea  Physical Exam  BP (!) 157/85 (BP Location: Left Arm)   Pulse 84   Temp 98.1 F (36.7 C) (Oral)   Resp 18   SpO2 98%  Gen:   Awake, no distress   Resp:  Normal effort  MSK:   Moves extremities without difficulty  Other:  Moderately distended abdomen.  Abdomen generally tender to palpation.  Large protruding umbilical hernia  Medical Decision Making  Medically screening exam initiated at 11:35 AM.  Appropriate orders placed.  TRAY KLAYMAN was informed that the remainder of the evaluation will be completed by another provider, this initial triage assessment does not replace that evaluation, and the importance of remaining in the ED until their evaluation is complete.     Rhae Hammock, PA-C 08/23/22 1137

## 2022-08-23 NOTE — ED Provider Notes (Signed)
[facility] DEPT Provider Note   CSN: [id number] Arrival date & time: 08/23/22  1044     History  Chief Complaint  Patient presents with   Abdominal Pain    Patrick Franco is a 88 y.o. male with history of aortic stenosis s/p TAVR, atrial fibrillation on Eliquis, diverticulitis, hypertension, and hyperlipidemia presents the emergency department complaining of abdominal pain for the past 4 days.  Patient states that his pain came on somewhat suddenly, has been worsening.  It feels like cramping.  Has an umbilical hernia, and states this pain is located around there.  Last had bowel movement yesterday morning which was normal for him, however has been feeling nauseous with decreased appetite, as well as lots of belching.  As of today he is no longer passing gas. Follows with [facility] for gastric ulcers.    Abdominal Pain Associated symptoms: nausea and vomiting   Associated symptoms: no chills and no fever        Home Medications Prior to Admission medications   Medication Sig Start Date End Date Taking? Authorizing Provider  allopurinol (ZYLOPRIM) 100 MG tablet Take 2 tablets (200 mg total) by mouth daily. 12/29/21   Sherran Needs, NP  amoxicillin (AMOXIL) 500 MG tablet Take 4 tablets (2,000 mg total) by mouth as directed. 1 hour prior to dental work including cleanings 08/05/22   Eileen Stanford, PA-C  apixaban (ELIQUIS) 5 MG TABS tablet Take 1 tablet (5 mg total) by mouth 2 (two) times daily. 05/03/22   Burnell Blanks, MD  atorvastatin (LIPITOR) 10 MG tablet Take 10 mg by mouth every morning. 02/19/18   [provider]  diltiazem (CARDIZEM CD) 240 MG 24 hr capsule TAKE (1) CAPSULE DAILY. 07/15/22   Burnell Blanks, MD  dofetilide (TIKOSYN) 500 MCG capsule TAKE ONE CAPSULE BY MOUTH TWICE DAILY 07/07/22   Sherran Needs, NP  dutasteride (AVODART) 0.5 MG capsule Take 0.5 mg by mouth every morning. 01/06/15   [provider]  furosemide (LASIX) 40 MG tablet Take 60 mg by mouth 4 (four) times a week.    [provider]  influenza vaccine adjuvanted (FLUAD QUADRIVALENT) 0.5 ML injection Inject into the muscle. 08/15/22   Carlyle Basques, MD  metoprolol tartrate (LOPRESSOR) 25 MG tablet Take 1 tablet (25 mg total) by mouth daily. 07/12/22   Sherran Needs, NP  mirtazapine (REMERON) 30 MG tablet TAKE ONE TABLET AT BEDTIME. 08/03/20   Lyndal Pulley, DO  Multiple Vitamin (MULTIVITAMIN) tablet Take 1 tablet by mouth daily.    [provider]  Omega-3 Fatty Acids (FISH OIL) 1200 MG CAPS Take 1,200 mg by mouth daily.    [provider]  pantoprazole (PROTONIX) 40 MG tablet Take 1 tablet (40 mg total) by mouth daily. 12/29/21 12/29/22  Sherran Needs, NP  Polyethyl Glycol-Propyl Glycol (SYSTANE) 0.4-0.3 % SOLN Place 1 drop into both eyes 2 (two) times daily.    [provider]  potassium chloride SA (KLOR-CON) 20 MEQ tablet TAKE 1 TABLET BY MOUTH IN THE MORNING, TAKE 1/2 TABLET BY MOUTH IN THE P.M. 08/10/21   Richardson Dopp T, PA-C  Probiotic Product (PROBIOTIC DAILY PO) Take 1 tablet by mouth daily.    [provider]  pyridOXINE (VITAMIN B-6) 100 MG tablet Take 100 mg by mouth daily.    [provider]  Respiratory Therapy Supplies (FLUTTER) DEVI Use the flutter valve 10 times when doing it daily. 01/25/18  Brand Males, MD  spironolactone (ALDACTONE) 25 MG tablet Take 0.5 tablets (12.5 mg total) by mouth daily. 07/12/22   Sherran Needs, NP  tamsulosin (FLOMAX) 0.4 MG CAPS capsule Take 0.4 mg by mouth daily after breakfast. 01/06/15   [provider]  Turmeric 500 MG CAPS Take 500 mg by mouth daily.    [provider]  vitamin B-12 (CYANOCOBALAMIN) 1000 MCG tablet Take 1,000 mcg by mouth daily.    [provider]      Allergies    Patient has no known allergies.    Review of Systems   Review of Systems  Constitutional:  Negative for  chills and fever.  Gastrointestinal:  Positive for abdominal pain, nausea and vomiting.       Obstipation  All other systems reviewed and are negative.   Physical Exam Updated Vital Signs BP (!) 157/70 (BP Location: Left Arm)   Pulse (!) 44   Temp 97.6 F (36.4 C) (Oral)   Resp 18   SpO2 96%  Physical Exam Vitals and nursing note reviewed.  Constitutional:      Appearance: Normal appearance.  HENT:     Head: Normocephalic and atraumatic.  Eyes:     Conjunctiva/sclera: Conjunctivae normal.  Cardiovascular:     Rate and Rhythm: Normal rate and regular rhythm.  Pulmonary:     Effort: Pulmonary effort is normal. No respiratory distress.     Breath sounds: Normal breath sounds.  Abdominal:     General: Bowel sounds are decreased. There is distension.     Palpations: Abdomen is soft.     Tenderness: There is no abdominal tenderness.     Comments: Umbilical hernia distended and approximately 3 cm in diameter. Overlying erythema. Not significantly tender but unable to reduce upon initial exam  Skin:    General: Skin is warm and dry.  Neurological:     General: No focal deficit present.     Mental Status: He is alert.     ED Results / Procedures / Treatments   Labs (all labs ordered are listed, but only abnormal results are displayed) Labs Reviewed  COMPREHENSIVE METABOLIC PANEL - Abnormal; Notable for the following components:      Result Value   Glucose, Bld 137 (*)    BUN 26 (*)    Total Bilirubin 2.3 (*)    All other components within normal limits  CBC - Abnormal; Notable for the following components:   WBC 11.3 (*)    MCV 101.5 (*)    All other components within normal limits  URINALYSIS, ROUTINE W REFLEX MICROSCOPIC - Abnormal; Notable for the following components:   Color, Urine AMBER (*)    APPearance HAZY (*)    Ketones, ur 5 (*)    Protein, ur 100 (*)    Bacteria, UA RARE (*)    All other components within normal limits  LIPASE, BLOOD     EKG None  Radiology CT ABDOMEN PELVIS W CONTRAST  Result Date: 08/23/2022 CLINICAL DATA:  Stomach cramps and vomiting since Saturday, question hernia pain EXAM: CT ABDOMEN AND PELVIS WITH CONTRAST TECHNIQUE: Multidetector CT imaging of the abdomen and pelvis was performed using the standard protocol following bolus administration of intravenous contrast. RADIATION DOSE REDUCTION: This exam was performed according to the departmental dose-optimization program which includes automated exposure control, adjustment of the mA and/or kV according to patient size and/or use of iterative reconstruction technique. CONTRAST:  164m OMNIPAQUE IOHEXOL 300 MG/ML SOLN IV. No oral  contrast. COMPARISON:  06/07/2022 FINDINGS: Lower chest: Subsegmental atelectasis BILATERAL lower lobes greater on LEFT. Hepatobiliary: Hepatic cyst superiorly LEFT lobe 2.3 x 2.1 cm image 26 unchanged. Gallbladder and remainder of liver normal appearance. Pancreas: Normal appearance Spleen: Normal appearance Adrenals/Urinary Tract: Adrenal thickening without mass. Small BILATERAL renal cysts, largest LEFT kidney 2.1 cm diameter, all simple in character; no follow-up imaging recommended. Kidneys, ureters, and bladder otherwise normal appearance. Stomach/Bowel: Normal appendix. Extensive colonic diverticulosis, greatest at sigmoid colon, without evidence of diverticulitis. Stomach distended by fluid. Dilated proximal and decompressed distal small bowel loops consistent with small bowel obstruction, secondary to a small bowel loop within an umbilical hernia. Mild wall thickening of the segment within the hernia sac. No evidence of perforation. Vascular/Lymphatic: Atherosclerotic calcifications aorta and iliac arteries without aneurysm. Reproductive: Unremarkable prostate gland Other: Small amount of nonspecific free fluid in pelvis. Above-noted umbilical hernia containing fat and a nonobstructed small bowel loop. No free air or abscess.  Musculoskeletal: Degenerative disc/facet disease changes of lumbar spine with rotatory scoliosis and osseous demineralization. RIGHT hip prosthesis with beam hardening artifacts in pelvis. Osteoarthritic changes LEFT hip joint. IMPRESSION: Small bowel obstruction secondary to an obstructed small bowel loop within an umbilical hernia. Small amount of nonspecific free pelvic fluid without evidence of perforation or abscess. Extensive colonic diverticulosis without evidence of diverticulitis. Bibasilar atelectasis greater on LEFT. Aortic Atherosclerosis (ICD10-I70.0). Electronically Signed   By: Lavonia Dana M.D.   On: 08/23/2022 12:58    Procedures Procedures    Medications Ordered in ED Medications  iohexol (OMNIPAQUE) 300 MG/ML solution 100 mL (100 mLs Intravenous Contrast Given 08/23/22 1235)    ED Course/ Medical Decision Making/ A&P Clinical Course as of 08/23/22 1419  Tue Aug 23, 2022  1316 This is an 88 year old male presenting to the ED with 2 to 3 days of abdominal cramping pain, had a last bowel movement yesterday, has not passed gas since then.  He has belching and feeling nauseous.  He has a history of umbilical hernia but is protruding more.  On exam he is afebrile well-appearing.  He is not in significant distress.  He does have a moderate-sized umbilical hernia with loops of bowel palpable with thin, some overlying skin reddening and changes, no significant tenderness on palpation but we are not able to reduce the bowel at the bedside.  He has a minor leukocytosis.  CT scan shows a bowel obstruction secondary to an umbilical hernia.  I suspect he may be incarceration but not strangulation at that time.  We will discuss and consult with general surgery.  Patient n.p.o. and will need admission [MT]    Clinical Course User Index [MT] Trifan, Carola Rhine, MD                           Medical Decision Making Risk Prescription drug management.  This patient is a 88 y.o. male  who presents  to the ED for concern of abdominal pain x 4 days. No longer passing gas.   Differential diagnoses prior to evaluation: The emergent differential diagnosis includes, but is not limited to,  AAA, mesenteric ischemia, appendicitis, diverticulitis, DKA, gastritis/gastroenteritis, nephrolithiasis, pancreatitis, constipation, UTI, bowel obstruction, biliary disease, IBD, PUD, hepatitis, testicular torsion. This is not an exhaustive differential.   Past Medical History / Co-morbidities: aortic stenosis s/p TAVR, atrial fibrillation on Eliquis, diverticulitis, hypertension, and hyperlipidemia  Additional history: Chart reviewed. Pertinent results include: CT angio performed on 7/25 for pre-procedural  purposes showed umbilical hernia containing omental fat and fluid without obstruction or incarceration.   Physical Exam: Physical exam performed. The pertinent findings include: Hypertensive but otherwise normal vital signs. Abdomen distended with umbilical hernia. Not significantly tender but unable to reduce upon initial examination, concerning for incarceration.   Lab Tests/Imaging studies: I personally interpreted labs/imaging and the pertinent results include:  Leukocytosis of 11.3, CMP unremarkable.   CT abdomen/pelvis shows bowel obstruction secondary to obstructed small bowel loop within umbilical hernia, and nonspecific free pelvic fluid. I agree with the radiologist interpretation.  Consultations obtained: I consulted with [facility] Brooke Meuth PA-C. Discussed the case with surgeon Dr Marlou Starks at the bedside. Dr Marlou Starks was able to successfully reduce the hernia. We discussed the plan with the patient and his wife to admit him to the surgical service. Surgical team plans to do a hernia repair on Thursday morning.   Disposition: After consideration of the diagnostic results and the patients response to treatment, I feel that patient would benefit from admission for surgical hernia repair  in the setting of small bowel obstruction from umbilical hernia.   I discussed this case with my attending physician Dr. Langston Masker who cosigned this note including patient's presenting symptoms, physical exam, and planned diagnostics and interventions. Attending physician stated agreement with plan or made changes to plan which were implemented.   Final Clinical Impression(s) / ED Diagnoses Final diagnoses:  Umbilical hernia with obstruction, without gangrene    Rx / DC Orders ED Discharge Orders     None      Portions of this report may have been transcribed using voice recognition software. Every effort was made to ensure accuracy; however, inadvertent computerized transcription errors may be present.    Estill Cotta 08/23/22 1420    Wyvonnia Dusky, MD 08/23/22 [phone]

## 2022-08-23 NOTE — H&P (Signed)
[facility] Admission Note  Patrick Franco [birth year]  [id number].    Requesting MD: Octaviano Glow Chief Complaint/Reason for Consult: umbilical hernia  HPI:  Patrick Franco is a 88 y.o. male PMH A fib on eliquis (last dose 10/9 in PM) and tikosyn, MVP, CHF, OSA on CPAP, CAD, HTN, HLD, and AS s/p TAVR 07/26/2022 who presented to [facility] today complaining of worsening pain at site of known umbilical hernia. States that he has had this hernia for several years. It has popped out before but he is always able to self reduce. States that this time his symptoms started Saturday and have gotten worse. He has had some nausea, burping, and vomited once this morning. He did have a bowel movement yesterday but is not passing any flatus.  In the ED he underwent CT scan which reports small bowel obstruction secondary to an obstructed small bowel loop within an umbilical hernia; small amount of nonspecific free pelvic fluid without evidence of perforation or abscess. General surgery asked to see.  Abdominal surgical history: none  Family History  Problem Relation Age of Onset   Pneumonia Mother 73   Cancer - Lung Father 6   Multiple myeloma Brother 12    Past Medical History:  Diagnosis Date   Aortic stenosis    Arthritis    Atrial fibrillation (HCC)    BPH (benign prostatic hyperplasia)    Chronic low back pain    Depression    Diverticulitis    Dyspnea    Elevated PSA    Foot drop, right    Gait disorder 02/03/2015   Hip pain    History of colonoscopy    History of nuclear stress test    Myoview 10/22: EF 67, no ischemia or infarction; low risk   HOH (hard of hearing)    hearing aid   Hypertension    Lumbar radiculopathy    Mitral valve prolapse    Onychomycosis    Peptic ulcer disease    S/P TAVR (transcatheter aortic valve replacement) 07/26/2022   70m S3UR via TF approach with Dr. MAngelena Formand Dr. BRandolm Idol  Sepsis ([facility] 2005   e. coli-after prostate bx   Sleep  apnea    Torn ACL    Tracheobronchomalacia    seen on 2019 Chest CT    Past Surgical History:  Procedure Laterality Date   BIOPSY  10/05/2021   Procedure: BIOPSY;  Surgeon: KRonnette Juniper MD;  [facility];  Service: Gastroenterology;;   CARDIOVERSION N/A 03/27/2020   Procedure: CARDIOVERSION;  Surgeon: RSkeet Latch MD;  [place] [facility]  Service: Cardiovascular;  Laterality: N/A;   CATARACT EXTRACTION Bilateral    ESOPHAGOGASTRODUODENOSCOPY N/A 08/19/2015   Procedure: ESOPHAGOGASTRODUODENOSCOPY (EGD);  Surgeon: JLaurence Spates MD;  [facility];  Service: Endoscopy;  Laterality: N/A;   ESOPHAGOGASTRODUODENOSCOPY N/A 10/05/2021   Procedure: ESOPHAGOGASTRODUODENOSCOPY (EGD);  Surgeon: KRonnette Juniper MD;  [facility];  Service: Gastroenterology;  Laterality: N/A;   INTRAOPERATIVE TRANSTHORACIC ECHOCARDIOGRAM N/A 07/26/2022   Procedure: INTRAOPERATIVE TRANSTHORACIC ECHOCARDIOGRAM;  Surgeon: MBurnell Blanks MD;  [place] [facility]  Service: Open Heart Surgery;  Laterality: N/A;   right knee replacement Right 1996   RIGHT/LEFT HEART CATH AND CORONARY ANGIOGRAPHY N/A 05/26/2022   Procedure: RIGHT/LEFT HEART CATH AND CORONARY ANGIOGRAPHY;  Surgeon: MBurnell Blanks MD;  [facility];  Service: Cardiovascular;  Laterality: N/A;   SHOULDER SURGERY     TEE WITHOUT CARDIOVERSION N/A 03/27/2020   Procedure: TRANSESOPHAGEAL  ECHOCARDIOGRAM (TEE);  Surgeon: Skeet Latch, MD;  Location: [place];  Service: Cardiovascular;  Laterality: N/A;   TONSILLECTOMY     TOTAL HIP ARTHROPLASTY Right 08/13/2019   Procedure: RIGHT TOTAL HIP ARTHROPLASTY ANTERIOR APPROACH;  Surgeon: Mcarthur Rossetti, MD;  Location: [place];  Service: Orthopedics;  Laterality: Right;   TRANSCATHETER AORTIC VALVE REPLACEMENT, TRANSFEMORAL N/A 07/26/2022   Procedure: Transcatheter Aortic Valve Replacement, Transfemoral using a 26 MM Edwards SAPIEN 3 Ultra;   Surgeon: Burnell Blanks, MD;  Location: [place];  Service: Open Heart Surgery;  Laterality: N/A;  Transfemoral approach    Social History:  reports that he quit smoking about 62 years ago. His smoking use included cigarettes. He started smoking about 63 years ago. He smoked an average of .5 packs per day. He has never used smokeless tobacco. He reports current alcohol use of about 14.0 - 21.0 standard drinks of alcohol per week. He reports that he does not use drugs.  Allergies: No Known Allergies  (Not in a hospital admission)   Prior to Admission medications   Medication Sig Start Date End Date Taking? Authorizing Provider  allopurinol (ZYLOPRIM) 100 MG tablet Take 2 tablets (200 mg total) by mouth daily. 12/29/21   Sherran Needs, NP  amoxicillin (AMOXIL) 500 MG tablet Take 4 tablets (2,000 mg total) by mouth as directed. 1 hour prior to dental work including cleanings 08/05/22   Eileen Stanford, PA-C  apixaban (ELIQUIS) 5 MG TABS tablet Take 1 tablet (5 mg total) by mouth 2 (two) times daily. 05/03/22   Burnell Blanks, MD  atorvastatin (LIPITOR) 10 MG tablet Take 10 mg by mouth every morning. 02/19/18   [provider]  diltiazem (CARDIZEM CD) 240 MG 24 hr capsule TAKE (1) CAPSULE DAILY. 07/15/22   Burnell Blanks, MD  dofetilide (TIKOSYN) 500 MCG capsule TAKE ONE CAPSULE BY MOUTH TWICE DAILY 07/07/22   Sherran Needs, NP  dutasteride (AVODART) 0.5 MG capsule Take 0.5 mg by mouth every morning. 01/06/15   [provider]  furosemide (LASIX) 40 MG tablet Take 60 mg by mouth 4 (four) times a week.    [provider]  influenza vaccine adjuvanted (FLUAD QUADRIVALENT) 0.5 ML injection Inject into the muscle. 08/15/22   Carlyle Basques, MD  metoprolol tartrate (LOPRESSOR) 25 MG tablet Take 1 tablet (25 mg total) by mouth daily. 07/12/22   Sherran Needs, NP  mirtazapine (REMERON) 30 MG tablet TAKE ONE TABLET AT BEDTIME. 08/03/20   Lyndal Pulley, DO  Multiple Vitamin (MULTIVITAMIN) tablet Take 1 tablet by mouth daily.    [provider]  Omega-3 Fatty Acids (FISH OIL) 1200 MG CAPS Take 1,200 mg by mouth daily.    [provider]  pantoprazole (PROTONIX) 40 MG tablet Take 1 tablet (40 mg total) by mouth daily. 12/29/21 12/29/22  Sherran Needs, NP  Polyethyl Glycol-Propyl Glycol (SYSTANE) 0.4-0.3 % SOLN Place 1 drop into both eyes 2 (two) times daily.    [provider]  potassium chloride SA (KLOR-CON) 20 MEQ tablet TAKE 1 TABLET BY MOUTH IN THE MORNING, TAKE 1/2 TABLET BY MOUTH IN THE P.M. 08/10/21   Richardson Dopp T, PA-C  Probiotic Product (PROBIOTIC DAILY PO) Take 1 tablet by mouth daily.    [provider]  pyridOXINE (VITAMIN B-6) 100 MG tablet Take 100 mg by mouth daily.    [provider]  Respiratory Therapy Supplies (FLUTTER) DEVI Use the flutter valve 10 times when  doing it daily. 01/25/18   Brand Males, MD  spironolactone (ALDACTONE) 25 MG tablet Take 0.5 tablets (12.5 mg total) by mouth daily. 07/12/22   Sherran Needs, NP  tamsulosin (FLOMAX) 0.4 MG CAPS capsule Take 0.4 mg by mouth daily after breakfast. 01/06/15   [provider]  Turmeric 500 MG CAPS Take 500 mg by mouth daily.    [provider]  vitamin B-12 (CYANOCOBALAMIN) 1000 MCG tablet Take 1,000 mcg by mouth daily.    [provider]    Blood pressure (!) 157/70, pulse (!) 44, temperature 97.6 F (36.4 C), temperature source Oral, resp. rate 18, SpO2 96 %. Physical Exam: General: pleasant, elderly male who is laying in bed in NAD HEENT: head is normocephalic, atraumatic.  Sclera are noninjected.  Pupils equal and round.  Ears and nose without any masses or lesions.  Mouth is pink and moist. Dentition fair Heart: irregular rhythm, regular rate Lungs: CTAB, no wheezes, rhonchi, or rales noted.  Respiratory effort nonlabored on room air Abd: protuberant, soft, mild tenderness focally over  umbilical hernia/ no overlying skin changes/ hernia fully reduced MS: no BUE/BLE edema, calves soft and nontender Skin: warm and dry with no masses, lesions, or rashes Psych: A&Ox4 with an appropriate affect Neuro: MAEs, no gross motor or sensory deficits BUE/BLE  Results for orders placed or performed during the hospital encounter of 08/23/22 (from the past 48 hour(s))  Lipase, blood     Status: None   Collection Time: 08/23/22 11:42 AM  Result Value Ref Range   Lipase 33 11 - 51 U/L    Comment: Performed at [facility], [place] [address] [place], [place] [address]  Comprehensive metabolic panel     Status: Abnormal   Collection Time: 08/23/22 11:42 AM  Result Value Ref Range   Sodium 139 135 - 145 mmol/L   Potassium 4.5 3.5 - 5.1 mmol/L   Chloride 104 98 - 111 mmol/L   CO2 24 22 - 32 mmol/L   Glucose, Bld 137 (H) 70 - 99 mg/dL    Comment: Glucose reference range applies only to samples taken after fasting for at least 8 hours.   BUN 26 (H) 8 - 23 mg/dL   Creatinine, Ser 1.09 0.61 - 1.24 mg/dL   Calcium 9.8 8.9 - 10.3 mg/dL   Total Protein 7.5 6.5 - 8.1 g/dL   Albumin 4.6 3.5 - 5.0 g/dL   AST 26 15 - 41 U/L   ALT 22 0 - 44 U/L   Alkaline Phosphatase 68 38 - 126 U/L   Total Bilirubin 2.3 (H) 0.3 - 1.2 mg/dL   GFR, Estimated >60 >60 mL/min    Comment: (NOTE) Calculated using the CKD-EPI Creatinine Equation (2021)    Anion gap 11 5 - 15    Comment: Performed at [facility], [place] [address] [place], [place] [address]  CBC     Status: Abnormal   Collection Time: 08/23/22 11:42 AM  Result Value Ref Range   WBC 11.3 (H) 4.0 - 10.5 K/uL   RBC 4.77 4.22 - 5.81 MIL/uL   Hemoglobin 16.2 13.0 - 17.0 g/dL   HCT 48.4 39.0 - 52.0 %   MCV 101.5 (H) 80.0 - 100.0 fL   MCH 34.0 26.0 - 34.0 pg   MCHC 33.5 30.0 - 36.0 g/dL   RDW 14.2 11.5 - 15.5 %   Platelets 205 150 - 400 K/uL   nRBC 0.0 0.0 - 0.2 %  Comment: Performed at [facility], [place] [address] [place], [place] [address]  Urinalysis, Routine w reflex microscopic     Status: Abnormal   Collection Time: 08/23/22 11:42 AM  Result Value Ref Range   Color, Urine AMBER (A) YELLOW    Comment: BIOCHEMICALS MAY BE AFFECTED BY COLOR   APPearance HAZY (A) CLEAR   Specific Gravity, Urine 1.026 1.005 - 1.030   pH 5.0 5.0 - 8.0   Glucose, UA NEGATIVE NEGATIVE mg/dL   Hgb urine dipstick NEGATIVE NEGATIVE   Bilirubin Urine NEGATIVE NEGATIVE   Ketones, ur 5 (A) NEGATIVE mg/dL   Protein, ur 100 (A) NEGATIVE mg/dL   Nitrite NEGATIVE NEGATIVE   Leukocytes,Ua NEGATIVE NEGATIVE   RBC / HPF 0-5 0 - 5 RBC/hpf   WBC, UA 0-5 0 - 5 WBC/hpf   Bacteria, UA RARE (A) NONE SEEN   Squamous Epithelial / LPF 0-5 0 - 5   Mucus PRESENT    Hyaline Casts, UA PRESENT     Comment: Performed at [facility], [place] [address] [place], [place] [address]   CT ABDOMEN PELVIS W CONTRAST  Result Date: 08/23/2022 CLINICAL DATA:  Stomach cramps and vomiting since Saturday, question hernia pain EXAM: CT ABDOMEN AND PELVIS WITH CONTRAST TECHNIQUE: Multidetector CT imaging of the abdomen and pelvis was performed using the standard protocol following bolus administration of intravenous contrast. RADIATION DOSE REDUCTION: This exam was performed according to the departmental dose-optimization program which includes automated exposure control, adjustment of the mA and/or kV according to patient size and/or use of iterative reconstruction technique. CONTRAST:  169m OMNIPAQUE IOHEXOL 300 MG/ML SOLN IV. No oral contrast. COMPARISON:  06/07/2022 FINDINGS: Lower chest: Subsegmental atelectasis BILATERAL lower lobes greater on LEFT. Hepatobiliary: Hepatic cyst superiorly LEFT lobe 2.3 x 2.1 cm image 26 unchanged. Gallbladder and remainder of liver normal appearance. Pancreas: Normal appearance Spleen: Normal appearance Adrenals/Urinary Tract: Adrenal thickening without mass. Small BILATERAL  renal cysts, largest LEFT kidney 2.1 cm diameter, all simple in character; no follow-up imaging recommended. Kidneys, ureters, and bladder otherwise normal appearance. Stomach/Bowel: Normal appendix. Extensive colonic diverticulosis, greatest at sigmoid colon, without evidence of diverticulitis. Stomach distended by fluid. Dilated proximal and decompressed distal small bowel loops consistent with small bowel obstruction, secondary to a small bowel loop within an umbilical hernia. Mild wall thickening of the segment within the hernia sac. No evidence of perforation. Vascular/Lymphatic: Atherosclerotic calcifications aorta and iliac arteries without aneurysm. Reproductive: Unremarkable prostate gland Other: Small amount of nonspecific free fluid in pelvis. Above-noted umbilical hernia containing fat and a nonobstructed small bowel loop. No free air or abscess. Musculoskeletal: Degenerative disc/facet disease changes of lumbar spine with rotatory scoliosis and osseous demineralization. RIGHT hip prosthesis with beam hardening artifacts in pelvis. Osteoarthritic changes LEFT hip joint. IMPRESSION: Small bowel obstruction secondary to an obstructed small bowel loop within an umbilical hernia. Small amount of nonspecific free pelvic fluid without evidence of perforation or abscess. Extensive colonic diverticulosis without evidence of diverticulitis. Bibasilar atelectasis greater on LEFT. Aortic Atherosclerosis (ICD10-I70.0). Electronically Signed   By: MLavonia DanaM.D.   On: 08/23/2022 12:58      Assessment/Plan SBO secondary to incarcerated umbilical hernia - Patient seen and examined with Dr. TMarlou Starks Hernia was reduced. Would still recommend surgery this admission to prevent recurrence. Will admit to med-surg bed and keep mostly on bed rest. He last took his xarelto yesterday evening. We will hold his xarelto. Cardiology called for consult and advised he does  not need to be on heparin gtt, they will see him in the  morning for perioperative risk stratification and recommendations. Possible surgery tomorrow afternoon vs Thursday.  ID - none VTE - SCDs, hold xarelto/ discussed with cardiology and he does not need to be on heparin gtt FEN - IVF, HH diet, NPO after MN Foley - none  A fib on eliquis (last dose 10/9 in PM) and tikosyn MVP CHF - EF 65-70% on ECHO 07/27/2022 OSA on CPAP CAD HTN HLD AS s/p TAVR 07/26/2022  I reviewed ED provider notes, last 24 h vitals and pain scores, last 48 h intake and output, last 24 h labs and trends, and last 24 h imaging results.   Wellington Hampshire, [facility] 08/23/2022, 2:11 PM Please see Amion for pager number during day hours 7:00am-4:30pm

## 2022-08-23 NOTE — ED Notes (Signed)
ED TO INPATIENT HANDOFF REPORT  Name/Age/Gender Patrick Franco 88 y.o. male  Code Status    Code Status Orders  (From admission, onward)           Start     Ordered   08/23/22 1430  Full code  Continuous        08/23/22 1434           Code Status History     Date Active Date Inactive Code Status Order ID Comments User Context   07/26/2022 2037 07/27/2022 1932 Full Code [id number]  Crista Luria Inpatient   05/26/2022 2440 05/26/2022 1816 Full Code [id number]  Burnell Blanks, MD Inpatient   05/05/2020 1152 05/08/2020 2049 Full Code [id number]  Sherran Needs, NP Inpatient   03/05/2020 1913 03/08/2020 1633 Full Code [id number]  Abigail Butts., PA-C Inpatient   08/13/2019 1751 08/15/2019 1802 Full Code [id number]  Mcarthur Rossetti, MD Inpatient   08/18/2015 2010 08/19/2015 2044 Full Code [id number]  Mendel Corning, MD Inpatient      Advance Directive Documentation    Flowsheet Row Most Recent Value  Type of Advance Directive Healthcare Power of Attorney, Living will  Pre-existing out of facility DNR order (yellow form or pink MOST form) --  "MOST" Form in Place? --       Home/SNF/Other Home  Chief Complaint Umbilical hernia with obstruction [K42.0]  Level of Care/Admitting Diagnosis ED Disposition     ED Disposition  Admit   Condition  --   [place]: [facility] [[id number]]  Level of Care: Med-Surg [16]  May place patient in observation at [facility] or [facility] if equivalent level of care is available:: No  Covid Evaluation: Asymptomatic - no recent exposure (last 10 days) testing not required  Diagnosis: Umbilical hernia with obstruction [010.1.ICD-9-CM]  Admitting Physician: C[place], [place]  Attending Physician: CCS, MD [[id number]]          Medical History Past Medical History:  Diagnosis Date   Aortic stenosis    Arthritis    Atrial fibrillation ([place])    BPH (benign prostatic hyperplasia)     Chronic low back pain    Depression    Diverticulitis    Dyspnea    Elevated PSA    Foot drop, right    Gait disorder 02/03/2015   Hip pain    History of colonoscopy    History of nuclear stress test    Myoview 10/22: EF 67, no ischemia or infarction; low risk   HOH (hard of hearing)    hearing aid   Hypertension    Lumbar radiculopathy    Mitral valve prolapse    Onychomycosis    Peptic ulcer disease    S/P TAVR (transcatheter aortic valve replacement) 07/26/2022   68m S3UR via TF approach with Dr. MAngelena Formand Dr. BRandolm Idol  Sepsis ([facility] 2005   e. coli-after prostate bx   Sleep apnea    Torn ACL    Tracheobronchomalacia    seen on 2019 Chest CT    Allergies No Known Allergies  IV Location/Drains/Wounds Patient Lines/Drains/Airways Status     Active Line/Drains/Airways     Name Placement date Placement time Site Days   Peripheral IV 06/07/22 Anterior;Distal;Right;Upper Arm 06/07/22  1005  Arm  77   Peripheral IV 08/23/22 20 G Left;Posterior Hand 08/23/22  1225  Hand  less than 1   Incision (Closed) 08/13/19 Hip Right 08/13/19  1529  --  1106   Incision (Closed) 07/26/22 Groin Left 07/26/22  1715  -- 28   Incision (Closed) 07/26/22 Groin Right 07/26/22  1715  -- 28            Labs/Imaging Results for orders placed or performed during the hospital encounter of 08/23/22 (from the past 48 hour(s))  Lipase, blood     Status: None   Collection Time: 08/23/22 11:42 AM  Result Value Ref Range   Lipase 33 11 - 51 U/L    Comment: Performed at [facility], [place] [address] [place], [place] [address]  Comprehensive metabolic panel     Status: Abnormal   Collection Time: 08/23/22 11:42 AM  Result Value Ref Range   Sodium 139 135 - 145 mmol/L   Potassium 4.5 3.5 - 5.1 mmol/L   Chloride 104 98 - 111 mmol/L   CO2 24 22 - 32 mmol/L   Glucose, Bld 137 (H) 70 - 99 mg/dL    Comment: Glucose reference range applies only to samples taken after  fasting for at least 8 hours.   BUN 26 (H) 8 - 23 mg/dL   Creatinine, Ser 1.09 0.61 - 1.24 mg/dL   Calcium 9.8 8.9 - 10.3 mg/dL   Total Protein 7.5 6.5 - 8.1 g/dL   Albumin 4.6 3.5 - 5.0 g/dL   AST 26 15 - 41 U/L   ALT 22 0 - 44 U/L   Alkaline Phosphatase 68 38 - 126 U/L   Total Bilirubin 2.3 (H) 0.3 - 1.2 mg/dL   GFR, Estimated >60 >60 mL/min    Comment: (NOTE) Calculated using the CKD-EPI Creatinine Equation (2021)    Anion gap 11 5 - 15    Comment: Performed at [facility], [place] [address] [place], [place] [address]  CBC     Status: Abnormal   Collection Time: 08/23/22 11:42 AM  Result Value Ref Range   WBC 11.3 (H) 4.0 - 10.5 K/uL   RBC 4.77 4.22 - 5.81 MIL/uL   Hemoglobin 16.2 13.0 - 17.0 g/dL   HCT 48.4 39.0 - 52.0 %   MCV 101.5 (H) 80.0 - 100.0 fL   MCH 34.0 26.0 - 34.0 pg   MCHC 33.5 30.0 - 36.0 g/dL   RDW 14.2 11.5 - 15.5 %   Platelets 205 150 - 400 K/uL   nRBC 0.0 0.0 - 0.2 %    Comment: Performed at [facility], [place] [address] [place], [place] [address]  Urinalysis, Routine w reflex microscopic     Status: Abnormal   Collection Time: 08/23/22 11:42 AM  Result Value Ref Range   Color, Urine AMBER (A) YELLOW    Comment: BIOCHEMICALS MAY BE AFFECTED BY COLOR   APPearance HAZY (A) CLEAR   Specific Gravity, Urine 1.026 1.005 - 1.030   pH 5.0 5.0 - 8.0   Glucose, UA NEGATIVE NEGATIVE mg/dL   Hgb urine dipstick NEGATIVE NEGATIVE   Bilirubin Urine NEGATIVE NEGATIVE   Ketones, ur 5 (A) NEGATIVE mg/dL   Protein, ur 100 (A) NEGATIVE mg/dL   Nitrite NEGATIVE NEGATIVE   Leukocytes,Ua NEGATIVE NEGATIVE   RBC / HPF 0-5 0 - 5 RBC/hpf   WBC, UA 0-5 0 - 5 WBC/hpf   Bacteria, UA RARE (A) NONE SEEN   Squamous Epithelial / LPF 0-5 0 - 5   Mucus PRESENT    Hyaline Casts, UA PRESENT     Comment: Performed at [facility], [place] [address]  [place], [place] [address]   CT ABDOMEN PELVIS W CONTRAST  Result Date:  08/23/2022 CLINICAL DATA:  Stomach cramps and vomiting since Saturday, question hernia pain EXAM: CT ABDOMEN AND PELVIS WITH CONTRAST TECHNIQUE: Multidetector CT imaging of the abdomen and pelvis was performed using the standard protocol following bolus administration of intravenous contrast. RADIATION DOSE REDUCTION: This exam was performed according to the departmental dose-optimization program which includes automated exposure control, adjustment of the mA and/or kV according to patient size and/or use of iterative reconstruction technique. CONTRAST:  129m OMNIPAQUE IOHEXOL 300 MG/ML SOLN IV. No oral contrast. COMPARISON:  06/07/2022 FINDINGS: Lower chest: Subsegmental atelectasis BILATERAL lower lobes greater on LEFT. Hepatobiliary: Hepatic cyst superiorly LEFT lobe 2.3 x 2.1 cm image 26 unchanged. Gallbladder and remainder of liver normal appearance. Pancreas: Normal appearance Spleen: Normal appearance Adrenals/Urinary Tract: Adrenal thickening without mass. Small BILATERAL renal cysts, largest LEFT kidney 2.1 cm diameter, all simple in character; no follow-up imaging recommended. Kidneys, ureters, and bladder otherwise normal appearance. Stomach/Bowel: Normal appendix. Extensive colonic diverticulosis, greatest at sigmoid colon, without evidence of diverticulitis. Stomach distended by fluid. Dilated proximal and decompressed distal small bowel loops consistent with small bowel obstruction, secondary to a small bowel loop within an umbilical hernia. Mild wall thickening of the segment within the hernia sac. No evidence of perforation. Vascular/Lymphatic: Atherosclerotic calcifications aorta and iliac arteries without aneurysm. Reproductive: Unremarkable prostate gland Other: Small amount of nonspecific free fluid in pelvis. Above-noted umbilical hernia containing fat and a nonobstructed small bowel loop. No free air or abscess. Musculoskeletal: Degenerative disc/facet disease changes of lumbar spine with  rotatory scoliosis and osseous demineralization. RIGHT hip prosthesis with beam hardening artifacts in pelvis. Osteoarthritic changes LEFT hip joint. IMPRESSION: Small bowel obstruction secondary to an obstructed small bowel loop within an umbilical hernia. Small amount of nonspecific free pelvic fluid without evidence of perforation or abscess. Extensive colonic diverticulosis without evidence of diverticulitis. Bibasilar atelectasis greater on LEFT. Aortic Atherosclerosis (ICD10-I70.0). Electronically Signed   By: MLavonia DanaM.D.   On: 08/23/2022 12:58    Pending Labs Unresulted Labs (From admission, onward)     Start     Ordered   08/24/22 08413 Basic metabolic panel  Tomorrow morning,   R        08/23/22 1434   08/24/22 0500  CBC  Tomorrow morning,   R        08/23/22 1434            Vitals/Pain Today's Vitals   08/23/22 1058 08/23/22 1301 08/23/22 1329  BP: (!) 157/85 (!) 147/81 (!) 157/70  Pulse: 84 81 (!) 44  Resp: '18 18 18  '$ Temp: 98.1 F (36.7 C)  97.6 F (36.4 C)  TempSrc: Oral  Oral  SpO2: 98% 97% 96%    Isolation Precautions No active isolations  Medications Medications  lactated ringers infusion (has no administration in time range)  metoprolol tartrate (LOPRESSOR) injection 5 mg (has no administration in time range)  simethicone (MYLICON) chewable tablet 40 mg (has no administration in time range)  pantoprazole (PROTONIX) EC tablet 40 mg (has no administration in time range)  ondansetron (ZOFRAN-ODT) disintegrating tablet 4 mg (has no administration in time range)    Or  ondansetron (ZOFRAN) injection 4 mg (has no administration in time range)  polyethylene glycol (MIRALAX / GLYCOLAX) packet 17 g (has no administration in time range)  docusate sodium (COLACE) capsule 100 mg (has no administration in time range)  diphenhydrAMINE (BENADRYL) 12.5 MG/5ML elixir  12.5 mg (has no administration in time range)    Or  diphenhydrAMINE (BENADRYL) injection 12.5 mg  (has no administration in time range)  methocarbamol (ROBAXIN) tablet 500 mg (has no administration in time range)    Or  methocarbamol (ROBAXIN) 500 mg in dextrose 5 % 50 mL IVPB (has no administration in time range)  acetaminophen (TYLENOL) tablet 650 mg (has no administration in time range)    Or  acetaminophen (TYLENOL) suppository 650 mg (has no administration in time range)  morphine (PF) 2 MG/ML injection 1-2 mg (has no administration in time range)  oxyCODONE (Oxy IR/ROXICODONE) immediate release tablet 2.5-5 mg (has no administration in time range)  ceFAZolin (ANCEF) IVPB 2g/100 mL premix (has no administration in time range)  tamsulosin (FLOMAX) capsule 0.4 mg (has no administration in time range)  spironolactone (ALDACTONE) tablet 12.5 mg (has no administration in time range)  mirtazapine (REMERON) tablet 30 mg (has no administration in time range)  metoprolol tartrate (LOPRESSOR) tablet 25 mg (has no administration in time range)  dutasteride (AVODART) capsule 0.5 mg (has no administration in time range)  allopurinol (ZYLOPRIM) tablet 200 mg (has no administration in time range)  atorvastatin (LIPITOR) tablet 10 mg (has no administration in time range)  diltiazem (CARDIZEM CD) 24 hr capsule 240 mg (has no administration in time range)  dofetilide (TIKOSYN) capsule 500 mcg (has no administration in time range)  iohexol (OMNIPAQUE) 300 MG/ML solution 100 mL (100 mLs Intravenous Contrast Given 08/23/22 1235)    Mobility walks with device

## 2022-08-24 ENCOUNTER — Other Ambulatory Visit: Payer: Self-pay

## 2022-08-24 DIAGNOSIS — I4892 Unspecified atrial flutter: Secondary | ICD-10-CM | POA: Diagnosis present

## 2022-08-24 DIAGNOSIS — I472 Ventricular tachycardia, unspecified: Secondary | ICD-10-CM | POA: Diagnosis present

## 2022-08-24 DIAGNOSIS — I341 Nonrheumatic mitral (valve) prolapse: Secondary | ICD-10-CM | POA: Diagnosis present

## 2022-08-24 DIAGNOSIS — K420 Umbilical hernia with obstruction, without gangrene: Secondary | ICD-10-CM | POA: Diagnosis present

## 2022-08-24 DIAGNOSIS — G4733 Obstructive sleep apnea (adult) (pediatric): Secondary | ICD-10-CM | POA: Diagnosis present

## 2022-08-24 DIAGNOSIS — K259 Gastric ulcer, unspecified as acute or chronic, without hemorrhage or perforation: Secondary | ICD-10-CM | POA: Diagnosis present

## 2022-08-24 DIAGNOSIS — K429 Umbilical hernia without obstruction or gangrene: Secondary | ICD-10-CM | POA: Diagnosis not present

## 2022-08-24 DIAGNOSIS — Z96651 Presence of right artificial knee joint: Secondary | ICD-10-CM | POA: Diagnosis present

## 2022-08-24 DIAGNOSIS — I2510 Atherosclerotic heart disease of native coronary artery without angina pectoris: Secondary | ICD-10-CM | POA: Diagnosis present

## 2022-08-24 DIAGNOSIS — Z87891 Personal history of nicotine dependence: Secondary | ICD-10-CM | POA: Diagnosis not present

## 2022-08-24 DIAGNOSIS — F32A Depression, unspecified: Secondary | ICD-10-CM | POA: Diagnosis present

## 2022-08-24 DIAGNOSIS — D649 Anemia, unspecified: Secondary | ICD-10-CM | POA: Diagnosis not present

## 2022-08-24 DIAGNOSIS — Z7901 Long term (current) use of anticoagulants: Secondary | ICD-10-CM | POA: Diagnosis not present

## 2022-08-24 DIAGNOSIS — I4891 Unspecified atrial fibrillation: Secondary | ICD-10-CM | POA: Diagnosis not present

## 2022-08-24 DIAGNOSIS — I110 Hypertensive heart disease with heart failure: Secondary | ICD-10-CM | POA: Diagnosis present

## 2022-08-24 DIAGNOSIS — I10 Essential (primary) hypertension: Secondary | ICD-10-CM | POA: Diagnosis not present

## 2022-08-24 DIAGNOSIS — Z79899 Other long term (current) drug therapy: Secondary | ICD-10-CM | POA: Diagnosis not present

## 2022-08-24 DIAGNOSIS — I493 Ventricular premature depolarization: Secondary | ICD-10-CM | POA: Diagnosis present

## 2022-08-24 DIAGNOSIS — I4819 Other persistent atrial fibrillation: Secondary | ICD-10-CM

## 2022-08-24 DIAGNOSIS — N400 Enlarged prostate without lower urinary tract symptoms: Secondary | ICD-10-CM | POA: Diagnosis present

## 2022-08-24 DIAGNOSIS — Z953 Presence of xenogenic heart valve: Secondary | ICD-10-CM | POA: Diagnosis not present

## 2022-08-24 DIAGNOSIS — Z807 Family history of other malignant neoplasms of lymphoid, hematopoietic and related tissues: Secondary | ICD-10-CM | POA: Diagnosis not present

## 2022-08-24 DIAGNOSIS — Z01810 Encounter for preprocedural cardiovascular examination: Secondary | ICD-10-CM

## 2022-08-24 DIAGNOSIS — K5669 Other partial intestinal obstruction: Secondary | ICD-10-CM | POA: Diagnosis not present

## 2022-08-24 DIAGNOSIS — I5032 Chronic diastolic (congestive) heart failure: Secondary | ICD-10-CM | POA: Diagnosis present

## 2022-08-24 DIAGNOSIS — E785 Hyperlipidemia, unspecified: Secondary | ICD-10-CM | POA: Diagnosis present

## 2022-08-24 DIAGNOSIS — I350 Nonrheumatic aortic (valve) stenosis: Secondary | ICD-10-CM | POA: Diagnosis present

## 2022-08-24 LAB — BASIC METABOLIC PANEL
Anion gap: 7 (ref 5–15)
BUN: 25 mg/dL — ABNORMAL HIGH (ref 8–23)
CO2: 23 mmol/L (ref 22–32)
Calcium: 8.3 mg/dL — ABNORMAL LOW (ref 8.9–10.3)
Chloride: 108 mmol/L (ref 98–111)
Creatinine, Ser: 0.75 mg/dL (ref 0.61–1.24)
GFR, Estimated: >60 mL/min (ref >60)
Glucose, Bld: 94 mg/dL (ref 70–99)
Potassium: 4.2 mmol/L (ref 3.5–5.1)
Sodium: 138 mmol/L (ref 135–145)

## 2022-08-24 LAB — SURGICAL PCR SCREEN
MRSA, PCR: NEGATIVE
Staphylococcus aureus: NEGATIVE

## 2022-08-24 LAB — CBC
HCT: 37.5 % — ABNORMAL LOW (ref 39.0–52.0)
Hemoglobin: 12.1 g/dL — ABNORMAL LOW (ref 13.0–17.0)
MCH: 33.9 pg (ref 26.0–34.0)
MCHC: 32.3 g/dL (ref 30.0–36.0)
MCV: 105 fL — ABNORMAL HIGH (ref 80.0–100.0)
Platelets: 144 10*3/uL — ABNORMAL LOW (ref 150–400)
RBC: 3.57 MIL/uL — ABNORMAL LOW (ref 4.22–5.81)
RDW: 14.3 % (ref 11.5–15.5)
WBC: 6.3 10*3/uL (ref 4.0–10.5)
nRBC: 0 % (ref 0.0–0.2)

## 2022-08-24 MED ORDER — DOFETILIDE 250 MCG PO CAPS
500.0000 ug | ORAL_CAPSULE | Freq: Two times a day (BID) | ORAL | Status: DC
  Filled 2022-08-24: qty 2

## 2022-08-24 MED ORDER — METOPROLOL TARTRATE 25 MG PO TABS
25.0000 mg | ORAL_TABLET | Freq: Two times a day (BID) | ORAL | Status: DC
  Administered 2022-08-24 – 2022-08-25 (×3): 25 mg via ORAL
  Filled 2022-08-24 (×3): qty 1

## 2022-08-24 NOTE — Consult Note (Signed)
Cardiology Consultation   Patient ID: Patrick Franco MRN: [id number]; DOB: 06/09/1932  Admit date: 08/23/2022 Date of Consult: 08/24/2022  PCP:  Lavone Orn, MD   [place] [facility] Cardiologist:  [name], MD  Electrophysiologist:  Thompson Grayer, MD  {   Patient Profile:   Patrick Franco is a 88 y.o. male with a hx of atrial fibrillation on eliquis and tikosyn, MVP, CHF, OSA on CAP, CAD, HTN, HLD, AS s.p TAVR 07/26/2022 who is being seen 08/24/2022 for preoperative evaluation at the request of general surgery.  History of Present Illness:   [name] is an 88 year old male with above medical history who is followed by Dr. Angelena Form and Dr. Rayann Heman.  An echocardiogram in 09/2016 showed normal LV systolic function with EF 60-10%, grade 1 diastolic dysfunction, functionally bicuspid aortic valve with moderate stenosis (mean gradient 21 mmHg), mild mitral valve regurgitation. He had a colonoscopy on 04/04/2017 and was found to have an irregular heart rhythm.  He was referred to his PCP where EKG showed sinus rhythm with PACs.   Patient established care with [facility] in 03/2017 for evaluation of aortic stenosis and abnormal EKG. At his first visit with [facility] in 03/2017, he had complained of dyspnea, energy loss.  However he denied chest pain or dizziness.  A nuclear stress test on 04/07/2017 showed no ischemia. A later chest CT in 12/2017 showed evidence of CAD. Nuclear stress test from 08/19/2021 was a low risk study without evidence of ischemia or infarction.   Patient was noted to have developed atrial flutter in 02/2020.  He has been on Cardizem, Eliquis, Tikosyn.  Atrial flutter is followed by Dr. Rayann Heman in the EP clinic.   Patient/s aortic stenosis was monitored with annual echocardiograms. Echocardiogram from 05/04/2022 showed EF 60-65%, no regional wall motion abnormalities, mild LVH with grade 2 diastolic dysfunction, elevated LVEDP, normal RV systolic function, mildly  elevated pulmonary artery systolic pressure, severely dilated left atrium, mild mitral valve regurgitation, mild mitral stenosis moderate-severe aortic valve stenosis. Given progression of aortic stenosis, patient decided to proceed with planning for TAVR.  Right/left heart catheterization on 05/26/2022 showed 20% stenosis in the os RCA-mid RCA, 40% stenosis of the mid circumflex, 30% stenosis of proximal LAD-mid LAD.  There was 99% stenosis of the distal LAD, however it was heavily calcified and they were unable to cross the lesion with a wire.  Planned to treat his CAD medically and continue work-up for TAVR.  Commented that if patient continued to have dyspnea after TAVR, could consider complex PCI of the mid to distal LAD.  Patient underwent TAVR on 07/26/2022.  Postoperative echocardiogram on 07/27/2022 showed aortic valve prosthesis, no aortic valve regurgitation, LVEF 65-70%, mild LVH, normal RV systolic function, mildly elevated pulmonary artery systolic pressure, severely dilated left atrium, mild-moderate mitral valve stenosis.  Patient was last seen by cardiology on 08/05/2022.  At that time, patient did not have a lot of energy and had not been sleeping well.  He had continued to have shortness of breath and fatigue.  Patient presented to the ER on 10/10 complaining of abdominal pain, nausea/vomiting.  He was complaining of worsening pain at the site of a known umbilical hernia.  Portably had had a hernia for several years, it has popped out before but he has always been able to self reduce.  In the ED, patient underwent CT scan that reported a small bowel obstruction secondary to an obstructed small bowel loop within an  umbilical hernia.  Patient was seen by general surgery, hernia was able to be reduced.  However, they still recommend surgery this admission to prevent recurrence.  Cardiology was asked to see for preoperative risk stratification.  On interview, patient reports that since having his  TAVR, his shortness of breath on exertion has improved.  He continues to have some dyspnea on exertion, however he feels like he is able to walk farther and be more active.  He is able to participate in Pilates, water aerobics.  Denies having issues with chest pain.  He continues to have low energy.  Reports that since starting Tikosyn, his A-fib has been very well controlled and to the best of his knowledge she has been in normal sinus rhythm.  Does take a diuretic 4 times per week, this does help control lower extremity edema well.  Past Medical History:  Diagnosis Date   Aortic stenosis    Arthritis    Atrial fibrillation (HCC)    BPH (benign prostatic hyperplasia)    Chronic low back pain    Depression    Diverticulitis    Dyspnea    Elevated PSA    Foot drop, right    Gait disorder 02/03/2015   Hip pain    History of colonoscopy    History of nuclear stress test    Myoview 10/22: EF 67, no ischemia or infarction; low risk   HOH (hard of hearing)    hearing aid   Hypertension    Lumbar radiculopathy    Mitral valve prolapse    Onychomycosis    Peptic ulcer disease    S/P TAVR (transcatheter aortic valve replacement) 07/26/2022   61m S3UR via TF approach with Dr. MAngelena Formand Dr. BRandolm Idol  Sepsis ([facility] 2005   e. coli-after prostate bx   Sleep apnea    Torn ACL    Tracheobronchomalacia    seen on 2019 Chest CT    Past Surgical History:  Procedure Laterality Date   BIOPSY  10/05/2021   Procedure: BIOPSY;  Surgeon: KRonnette Juniper MD;  [facility];  Service: Gastroenterology;;   CARDIOVERSION N/A 03/27/2020   Procedure: CARDIOVERSION;  Surgeon: RSkeet Latch MD;  [facility]  Service: Cardiovascular;  Laterality: N/A;   CATARACT EXTRACTION Bilateral    ESOPHAGOGASTRODUODENOSCOPY N/A 08/19/2015   Procedure: ESOPHAGOGASTRODUODENOSCOPY (EGD);  Surgeon: JLaurence Spates MD;  [facility];  Service: Endoscopy;  Laterality: N/A;    ESOPHAGOGASTRODUODENOSCOPY N/A 10/05/2021   Procedure: ESOPHAGOGASTRODUODENOSCOPY (EGD);  Surgeon: KRonnette Juniper MD;  [facility];  Service: Gastroenterology;  Laterality: N/A;   INTRAOPERATIVE TRANSTHORACIC ECHOCARDIOGRAM N/A 07/26/2022   Procedure: INTRAOPERATIVE TRANSTHORACIC ECHOCARDIOGRAM;  Surgeon: MBurnell Blanks MD;  [place] [facility]  Service: Open Heart Surgery;  Laterality: N/A;   right knee replacement Right 1996   RIGHT/LEFT HEART CATH AND CORONARY ANGIOGRAPHY N/A 05/26/2022   Procedure: RIGHT/LEFT HEART CATH AND CORONARY ANGIOGRAPHY;  Surgeon: MBurnell Blanks MD;  [facility];  Service: Cardiovascular;  Laterality: N/A;   SHOULDER SURGERY     TEE WITHOUT CARDIOVERSION N/A 03/27/2020   Procedure: TRANSESOPHAGEAL ECHOCARDIOGRAM (TEE);  Surgeon: RSkeet Latch MD;  [facility]  Service: Cardiovascular;  Laterality: N/A;   TONSILLECTOMY     TOTAL HIP ARTHROPLASTY Right 08/13/2019   Procedure: RIGHT TOTAL HIP ARTHROPLASTY ANTERIOR APPROACH;  Surgeon: BMcarthur Rossetti MD;  [facility]  Service: Orthopedics;  Laterality: Right;   TRANSCATHETER AORTIC VALVE REPLACEMENT, TRANSFEMORAL N/A 07/26/2022  Procedure: Transcatheter Aortic Valve Replacement, Transfemoral using a 26 MM Edwards SAPIEN 3 Ultra;  Surgeon: Burnell Blanks, MD;  Location: [place];  Service: Open Heart Surgery;  Laterality: N/A;  Transfemoral approach     Home Medications:  Prior to Admission medications   Medication Sig Start Date End Date Taking? Authorizing Provider  allopurinol (ZYLOPRIM) 100 MG tablet Take 2 tablets (200 mg total) by mouth daily. 12/29/21  Yes Sherran Needs, NP  amoxicillin (AMOXIL) 500 MG tablet Take 4 tablets (2,000 mg total) by mouth as directed. 1 hour prior to dental work including cleanings Patient taking differently: Take 2,000 mg by mouth See admin instructions. Take 4 tablets by mouth 1 hour prior to dental work  including cleanings only per patient 08/05/22  Yes Eileen Stanford, PA-C  apixaban (ELIQUIS) 5 MG TABS tablet Take 1 tablet (5 mg total) by mouth 2 (two) times daily. 05/03/22  Yes Burnell Blanks, MD  atorvastatin (LIPITOR) 10 MG tablet Take 10 mg by mouth every morning. 02/19/18  Yes [provider]  cyanocobalamin (VITAMIN B12) 1000 MCG tablet Take 1,000 mcg by mouth daily.   Yes [provider]  diltiazem (CARDIZEM CD) 240 MG 24 hr capsule TAKE (1) CAPSULE DAILY. Patient taking differently: Take 240 mg by mouth daily. 07/15/22  Yes Burnell Blanks, MD  dofetilide (TIKOSYN) 500 MCG capsule TAKE ONE CAPSULE BY MOUTH TWICE DAILY 07/07/22  Yes Sherran Needs, NP  dutasteride (AVODART) 0.5 MG capsule Take 0.5 mg by mouth every morning. 01/06/15  Yes [provider]  furosemide (LASIX) 40 MG tablet Take 60 mg by mouth 4 (four) times a week.   Yes [provider]  metoprolol tartrate (LOPRESSOR) 25 MG tablet Take 1 tablet (25 mg total) by mouth daily. 07/12/22  Yes Sherran Needs, NP  mirtazapine (REMERON) 30 MG tablet TAKE ONE TABLET AT BEDTIME. 08/03/20  Yes Lyndal Pulley, DO  Multiple Vitamin (MULTIVITAMIN) tablet Take 1 tablet by mouth daily.   Yes [provider]  Omega-3 Fatty Acids (FISH OIL) 1200 MG CAPS Take 1,200 mg by mouth daily.   Yes [provider]  pantoprazole (PROTONIX) 40 MG tablet Take 1 tablet (40 mg total) by mouth daily. 12/29/21 12/29/22 Yes Sherran Needs, NP  Polyethyl Glycol-Propyl Glycol (SYSTANE) 0.4-0.3 % SOLN Place 1 drop into both eyes 2 (two) times daily.   Yes [provider]  potassium chloride SA (KLOR-CON) 20 MEQ tablet TAKE 1 TABLET BY MOUTH IN THE MORNING, TAKE 1/2 TABLET BY MOUTH IN THE P.M. Patient taking differently: Take 10-20 mEq by mouth See admin instructions. TAKE 1 TABLET BY MOUTH IN THE MORNING, TAKE 1/2 TABLET BY MOUTH IN THE EVENING 08/10/21  Yes Weaver, [name] T, PA-C   Probiotic Product (PROBIOTIC DAILY PO) Take 1 tablet by mouth daily.   Yes [provider]  pyridOXINE (VITAMIN B-6) 100 MG tablet Take 100 mg by mouth daily.   Yes [provider]  Respiratory Therapy Supplies (FLUTTER) DEVI Use the flutter valve 10 times when doing it daily. 01/25/18  Yes Brand Males, MD  spironolactone (ALDACTONE) 25 MG tablet Take 0.5 tablets (12.5 mg total) by mouth daily. 07/12/22  Yes Sherran Needs, NP  tamsulosin (FLOMAX) 0.4 MG CAPS capsule Take 0.4 mg by mouth daily after breakfast. 01/06/15  Yes [provider]  Turmeric 500 MG CAPS Take 500 mg by mouth daily.   Yes [provider]  influenza vaccine adjuvanted (FLUAD  QUADRIVALENT) 0.5 ML injection Inject into the muscle. 08/15/22   Carlyle Basques, MD    Inpatient Medications: Scheduled Meds:  allopurinol  200 mg Oral Daily   atorvastatin  10 mg Oral q morning   diltiazem  240 mg Oral Daily   docusate sodium  100 mg Oral BID   dofetilide  500 mcg Oral BID   dutasteride  0.5 mg Oral q morning   metoprolol tartrate  25 mg Oral BID   mirtazapine  30 mg Oral QHS   pantoprazole  40 mg Oral Daily   spironolactone  12.5 mg Oral Daily   tamsulosin  0.4 mg Oral Daily   Continuous Infusions:   ceFAZolin (ANCEF) IV     lactated ringers 50 mL/hr at 08/23/22 1754   methocarbamol (ROBAXIN) IV     PRN Meds: acetaminophen **OR** acetaminophen, diphenhydrAMINE **OR** diphenhydrAMINE, methocarbamol **OR** methocarbamol (ROBAXIN) IV, metoprolol tartrate, morphine injection, ondansetron **OR** ondansetron (ZOFRAN) IV, oxyCODONE, polyethylene glycol, simethicone  Allergies:   No Known Allergies  Social History:   Social History   Socioeconomic History   Marital status: Married    Spouse name: Not on file   Number of children: 2   Years of education: Not on file   Highest education level: Not on file  Occupational History   Occupation: Retired Social research officer, government  Tobacco Use    Smoking status: Former    Packs/day: 0.50    Types: Cigarettes    Start date: 1960    Quit date: 1961    Years since quitting: 62.8   Smokeless tobacco: Never   Tobacco comments:    Pt smoked [facility]month [facility]   Vaping Use: Never used  Substance and Sexual Activity   Alcohol use: Yes    Alcohol/week: 14.0 - 21.0 standard drinks of alcohol    Types: 14 - 21 Glasses of wine per week    Comment: 1-2 glasses of wine nightly   Drug use: Never   Sexual activity: Yes  Other Topics Concern   Not on file  Social History Narrative   Patient is right handed.   Patient drinks 1 cup of coffee per day.   Social Determinants of Health   Financial Resource Strain: Not on file  Food Insecurity: No Food Insecurity (08/23/2022)   Hunger Vital Sign    Worried About Running Out of Food in the Last Year: Never true    Ran Out of Food in the Last Year: Never true  Transportation Needs: No Transportation Needs (08/23/2022)   PRAPARE - THydrologist(Medical): No    Lack of Transportation (Non-Medical): No  Physical Activity: Not on file  Stress: Not on file  Social Connections: Not on file  Intimate Partner Violence: Not on file    Family History:    Family History  Problem Relation Age of Onset   Pneumonia Mother 847  Cancer - Lung Father 513  Multiple myeloma Brother 573    ROS:  Please see the history of present illness.  All other ROS reviewed and negative.     Physical Exam/Data:   Vitals:   08/23/22 2105 08/24/22 0119 08/24/22 0517 08/24/22 0900  BP: 120/64 110/71 (!) 112/55 136/77  Pulse: 87 85 76 87  Resp: 18 18 18 14   Temp: 98 F (36.7 C) 97.8 F (36.6 C) 98 F (36.7 C) 97.9 F (36.6 C)  TempSrc: Oral Oral Oral Oral  SpO2:  97% 98% 96% 96%    Intake/Output Summary (Last 24 hours) at 08/24/2022 1037 Last data filed at 08/24/2022 1000 Gross per 24 hour  Intake 1107.47 ml  Output 550 ml  Net 557.47 ml       08/05/2022   11:02 AM 07/27/2022    5:00 AM 07/26/2022    8:00 PM  Last 3 Weights  Weight (lbs) 212 lb 212 lb 15.4 oz 210 lb 1.6 oz  Weight (kg) 96.163 kg 96.6 kg 95.3 kg     There is no height or weight on file to calculate BMI.  General:  Well nourished, well developed, in no acute distress. Sitting comfortably in the bed  HEENT: normal Neck: no JVD Vascular: No carotid bruits; Radial pulses 2+ bilaterally Cardiac:  normal S1, S2; RRR; grade 2/6 systolic murmur at upper sternal boarder  Lungs:  clear to auscultation bilaterally, no wheezing, rhonchi or rales. Normal WOB on room air  Ext: no edema Musculoskeletal:  No deformities, BUE and BLE strength normal and equal Skin: warm and dry  Neuro:  CNs 2-12 intact, no focal abnormalities noted Psych:  Normal affect   EKG:  The EKG was personally reviewed and demonstrates:  NA  Telemetry:  Telemetry was personally reviewed and demonstrates:  NA  Relevant CV Studies:   Laboratory Data:  High Sensitivity Troponin:  No results for input(s): "TROPONINIHS" in the last 720 hours.   Chemistry Recent Labs  Lab 08/23/22 1142 08/24/22 0422  NA 139 138  K 4.5 4.2  CL 104 108  CO2 24 23  GLUCOSE 137* 94  BUN 26* 25*  CREATININE 1.09 0.75  CALCIUM 9.8 8.3*  GFRNONAA >60 >60  ANIONGAP 11 7    Recent Labs  Lab 08/23/22 1142  PROT 7.5  ALBUMIN 4.6  AST 26  ALT 22  ALKPHOS 68  BILITOT 2.3*   Lipids No results for input(s): "CHOL", "TRIG", "HDL", "LABVLDL", "LDLCALC", "CHOLHDL" in the last 168 hours.  Hematology Recent Labs  Lab 08/23/22 1142 08/24/22 0422  WBC 11.3* 6.3  RBC 4.77 3.57*  HGB 16.2 12.1*  HCT 48.4 37.5*  MCV 101.5* 105.0*  MCH 34.0 33.9  MCHC 33.5 32.3  RDW 14.2 14.3  PLT 205 144*   Thyroid No results for input(s): "TSH", "FREET4" in the last 168 hours.  BNPNo results for input(s): "BNP", "PROBNP" in the last 168 hours.  DDimer No results for input(s): "DDIMER" in the last 168  hours.   Radiology/Studies:  CT ABDOMEN PELVIS W CONTRAST  Result Date: 08/23/2022 CLINICAL DATA:  Stomach cramps and vomiting since Saturday, question hernia pain EXAM: CT ABDOMEN AND PELVIS WITH CONTRAST TECHNIQUE: Multidetector CT imaging of the abdomen and pelvis was performed using the standard protocol following bolus administration of intravenous contrast. RADIATION DOSE REDUCTION: This exam was performed according to the departmental dose-optimization program which includes automated exposure control, adjustment of the mA and/or kV according to patient size and/or use of iterative reconstruction technique. CONTRAST:  120m OMNIPAQUE IOHEXOL 300 MG/ML SOLN IV. No oral contrast. COMPARISON:  06/07/2022 FINDINGS: Lower chest: Subsegmental atelectasis BILATERAL lower lobes greater on LEFT. Hepatobiliary: Hepatic cyst superiorly LEFT lobe 2.3 x 2.1 cm image 26 unchanged. Gallbladder and remainder of liver normal appearance. Pancreas: Normal appearance Spleen: Normal appearance Adrenals/Urinary Tract: Adrenal thickening without mass. Small BILATERAL renal cysts, largest LEFT kidney 2.1 cm diameter, all simple in character; no follow-up imaging recommended. Kidneys, ureters, and bladder otherwise normal appearance. Stomach/Bowel: Normal appendix. Extensive colonic diverticulosis, greatest  at sigmoid colon, without evidence of diverticulitis. Stomach distended by fluid. Dilated proximal and decompressed distal small bowel loops consistent with small bowel obstruction, secondary to a small bowel loop within an umbilical hernia. Mild wall thickening of the segment within the hernia sac. No evidence of perforation. Vascular/Lymphatic: Atherosclerotic calcifications aorta and iliac arteries without aneurysm. Reproductive: Unremarkable prostate gland Other: Small amount of nonspecific free fluid in pelvis. Above-noted umbilical hernia containing fat and a nonobstructed small bowel loop. No free air or abscess.  Musculoskeletal: Degenerative disc/facet disease changes of lumbar spine with rotatory scoliosis and osseous demineralization. RIGHT hip prosthesis with beam hardening artifacts in pelvis. Osteoarthritic changes LEFT hip joint. IMPRESSION: Small bowel obstruction secondary to an obstructed small bowel loop within an umbilical hernia. Small amount of nonspecific free pelvic fluid without evidence of perforation or abscess. Extensive colonic diverticulosis without evidence of diverticulitis. Bibasilar atelectasis greater on LEFT. Aortic Atherosclerosis (ICD10-I70.0). Electronically Signed   By: Lavonia Dana M.D.   On: 08/23/2022 12:58     Assessment and Plan:   Perioperative risk evaluation - According to the Revised Cardiac Risk Index, patient has a 10.1% 30-day risk of death, MI, or cardiac arrest due to his history of ischemic hear disease and CHF. Additionally, patient has a history of atrial fibrillation, OSA that further increase his risk of perioperative complications  - Patient is able to walk up and down stairs, do gentle piliates, and exercise in a pool without chest pain, severe sob.  - Most recent cardiac catheterization from 05/26/2022 showed 99% stenosis of the distal LAD. Dr. Angelena Form was unable to cross the lesion so he recommended medical management. Also showed 40% stenosis of the mid Cx, 30% stenosis of the prox-mid LAD, and 20% stenosis of the ost-mid RCA  - Patient underwent TAVR on 07/26/22. Echo post-procedure showed a normal aortic prosthesis, no aortic valve regurgitation, LVEF 65-70%, no regional wall motion abnormalities , mild LVH, normal RV systolic function, severely dialted LA, mild mitral valve regurgitation, mild-moderate mitral stenosis.  - Has not had an EKG yet this admission-- I ordered.  - No plans for further cardiology workup prior to surgery. MD to see   Aortic stenosis s/p TAVR 07/26/22 - Echo on 9/13 post-procedure showed normal prosthesis, no aortic regurgitation.  26 mm S3. Vmax 1.6 m/s, MG 5.5 mmHG, EOA 2.45, DI 0.57. - 6 week echo scheduled from 10/20. Discussed with MD, no plan to perform echo prior to surgery   Paroxysmal atrial fibrillation - Patient on Tikosyn prior to admission, did not take either dose yesterday. Resume this AM making sure he does not miss any more doses  - Continue lopressor 25 mg BID, diltiazem 240 mg daily  - Eliquis held for surgery  - Patient is not on telemetry. Was in normal rhythm on physical exam, but I ordered EKG to confirm. Also ordered EKG for tomorrow AM to assess QT on tikosyn   Chronic diastolic heart failure  - Most recent echo with mild LVH, LVEF 65-70% - Patient euvolemic on exam  - Continue spironolactone 12.5 mg daily  - PRN lasix    Risk Assessment/Risk Scores:    New York Heart Association (NYHA) Functional Class NYHA Class II  CHA2DS2-VASc Score = 5   This indicates a 7.2% annual risk of stroke. The patient's score is based upon: CHF History: 1 HTN History: 1 Diabetes History: 0 Stroke History: 0 Vascular Disease History: 1 Age Score: 2 Gender Score: 0   For questions or updates,  please contact [place] Please consult [email or web address] for contact info under    Signed, Margie Billet, PA-C  08/24/2022 10:37 AM

## 2022-08-24 NOTE — Progress Notes (Signed)
[facility] Progress Note     Subjective: CC-  No complaints this morning. Hernia remains reduced. Bloating improved. Denies n/v. Tolerating diet. BM yesterday.  Objective: Vital signs in last 24 hours: Temp:  [97.5 F (36.4 C)-98.1 F (36.7 C)] 98 F (36.7 C) (10/11 0517) Pulse Rate:  [44-87] 76 (10/11 0517) Resp:  [18] 18 (10/11 0517) BP: (110-157)/(55-85) 112/55 (10/11 0517) SpO2:  [96 %-98 %] 96 % (10/11 0517) Last BM Date : 08/23/22  Intake/Output from previous day: 10/10 0701 - 10/11 0700 In: 747.5 [P.O.:240; I.V.:507.5] Out: 350 [Urine:350] Intake/Output this shift: No intake/output data recorded.  PE: Gen:  Alert, NAD, pleasant Abd: protuberant, soft, nontender, reduced umbilical hernia  Lab Results:  Recent Labs    08/23/22 1142 08/24/22 0422  WBC 11.3* 6.3  HGB 16.2 12.1*  HCT 48.4 37.5*  PLT 205 144*   BMET Recent Labs    08/23/22 1142 08/24/22 0422  NA 139 138  K 4.5 4.2  CL 104 108  CO2 24 23  GLUCOSE 137* 94  BUN 26* 25*  CREATININE 1.09 0.75  CALCIUM 9.8 8.3*   PT/INR No results for input(s): "LABPROT", "INR" in the last 72 hours. CMP     Component Value Date/Time   NA 138 08/24/2022 0422   NA 139 05/10/2022 0938   K 4.2 08/24/2022 0422   CL 108 08/24/2022 0422   CO2 23 08/24/2022 0422   GLUCOSE 94 08/24/2022 0422   BUN 25 (H) 08/24/2022 0422   BUN 32 (H) 05/10/2022 0938   CREATININE 0.75 08/24/2022 0422   CALCIUM 8.3 (L) 08/24/2022 0422   PROT 7.5 08/23/2022 1142   ALBUMIN 4.6 08/23/2022 1142   AST 26 08/23/2022 1142   ALT 22 08/23/2022 1142   ALKPHOS 68 08/23/2022 1142   BILITOT 2.3 (H) 08/23/2022 1142   GFRNONAA >60 08/24/2022 0422   GFRAA 79 09/16/2020 1135   Lipase     Component Value Date/Time   LIPASE 33 08/23/2022 1142       Studies/Results: CT ABDOMEN PELVIS W CONTRAST  Result Date: 08/23/2022 CLINICAL DATA:  Stomach cramps and vomiting since Saturday, question hernia pain EXAM: CT ABDOMEN  AND PELVIS WITH CONTRAST TECHNIQUE: Multidetector CT imaging of the abdomen and pelvis was performed using the standard protocol following bolus administration of intravenous contrast. RADIATION DOSE REDUCTION: This exam was performed according to the departmental dose-optimization program which includes automated exposure control, adjustment of the mA and/or kV according to patient size and/or use of iterative reconstruction technique. CONTRAST:  131m OMNIPAQUE IOHEXOL 300 MG/ML SOLN IV. No oral contrast. COMPARISON:  06/07/2022 FINDINGS: Lower chest: Subsegmental atelectasis BILATERAL lower lobes greater on LEFT. Hepatobiliary: Hepatic cyst superiorly LEFT lobe 2.3 x 2.1 cm image 26 unchanged. Gallbladder and remainder of liver normal appearance. Pancreas: Normal appearance Spleen: Normal appearance Adrenals/Urinary Tract: Adrenal thickening without mass. Small BILATERAL renal cysts, largest LEFT kidney 2.1 cm diameter, all simple in character; no follow-up imaging recommended. Kidneys, ureters, and bladder otherwise normal appearance. Stomach/Bowel: Normal appendix. Extensive colonic diverticulosis, greatest at sigmoid colon, without evidence of diverticulitis. Stomach distended by fluid. Dilated proximal and decompressed distal small bowel loops consistent with small bowel obstruction, secondary to a small bowel loop within an umbilical hernia. Mild wall thickening of the segment within the hernia sac. No evidence of perforation. Vascular/Lymphatic: Atherosclerotic calcifications aorta and iliac arteries without aneurysm. Reproductive: Unremarkable prostate gland Other: Small amount of nonspecific free fluid in pelvis. Above-noted umbilical hernia containing fat and  a nonobstructed small bowel loop. No free air or abscess. Musculoskeletal: Degenerative disc/facet disease changes of lumbar spine with rotatory scoliosis and osseous demineralization. RIGHT hip prosthesis with beam hardening artifacts in pelvis.  Osteoarthritic changes LEFT hip joint. IMPRESSION: Small bowel obstruction secondary to an obstructed small bowel loop within an umbilical hernia. Small amount of nonspecific free pelvic fluid without evidence of perforation or abscess. Extensive colonic diverticulosis without evidence of diverticulitis. Bibasilar atelectasis greater on LEFT. Aortic Atherosclerosis (ICD10-I70.0). Electronically Signed   By: Lavonia Dana M.D.   On: 08/23/2022 12:58    Anti-infectives: Anti-infectives (From admission, onward)    Start     Dose/Rate Route Frequency Ordered Stop   08/24/22 0600  ceFAZolin (ANCEF) IVPB 2g/100 mL premix        2 g 200 mL/hr over 30 Minutes Intravenous On call to O.R. 08/23/22 1434 08/25/22 0559        Assessment/Plan SBO secondary to incarcerated umbilical hernia - Hernia remains reduced and patient without obstructive symptoms. Cardiology to see patient today. Continue holding xarelto. Planning for surgery tomorrow.   ID - none VTE - SCDs, hold xarelto/ discussed with cardiology and he does not need to be on heparin gtt FEN - IVF, HH diet, NPO after MN Foley - none   A fib on eliquis (last dose 10/9 in PM) and tikosyn MVP CHF - EF 65-70% on ECHO 07/27/2022 OSA on CPAP CAD HTN HLD AS s/p TAVR 07/26/2022   I reviewed last 24 h vitals and pain scores, last 48 h intake and output, last 24 h labs and trends, and last 24 h imaging results.    LOS: 0 days    Wellington Hampshire, [facility] 08/24/2022, 9:48 AM Please see Amion for pager number during day hours 7:00am-4:30pm

## 2022-08-24 NOTE — H&P (View-Only) (Signed)
[facility] Progress Note     Subjective: CC-  No complaints this morning. Hernia remains reduced. Bloating improved. Denies n/v. Tolerating diet. BM yesterday.  Objective: Vital signs in last 24 hours: Temp:  [97.5 F (36.4 C)-98.1 F (36.7 C)] 98 F (36.7 C) (10/11 0517) Pulse Rate:  [44-87] 76 (10/11 0517) Resp:  [18] 18 (10/11 0517) BP: (110-157)/(55-85) 112/55 (10/11 0517) SpO2:  [96 %-98 %] 96 % (10/11 0517) Last BM Date : 08/23/22  Intake/Output from previous day: 10/10 0701 - 10/11 0700 In: 747.5 [P.O.:240; I.V.:507.5] Out: 350 [Urine:350] Intake/Output this shift: No intake/output data recorded.  PE: Gen:  Alert, NAD, pleasant Abd: protuberant, soft, nontender, reduced umbilical hernia  Lab Results:  Recent Labs    08/23/22 1142 08/24/22 0422  WBC 11.3* 6.3  HGB 16.2 12.1*  HCT 48.4 37.5*  PLT 205 144*   BMET Recent Labs    08/23/22 1142 08/24/22 0422  NA 139 138  K 4.5 4.2  CL 104 108  CO2 24 23  GLUCOSE 137* 94  BUN 26* 25*  CREATININE 1.09 0.75  CALCIUM 9.8 8.3*   PT/INR No results for input(s): "LABPROT", "INR" in the last 72 hours. CMP     Component Value Date/Time   NA 138 08/24/2022 0422   NA 139 05/10/2022 0938   K 4.2 08/24/2022 0422   CL 108 08/24/2022 0422   CO2 23 08/24/2022 0422   GLUCOSE 94 08/24/2022 0422   BUN 25 (H) 08/24/2022 0422   BUN 32 (H) 05/10/2022 0938   CREATININE 0.75 08/24/2022 0422   CALCIUM 8.3 (L) 08/24/2022 0422   PROT 7.5 08/23/2022 1142   ALBUMIN 4.6 08/23/2022 1142   AST 26 08/23/2022 1142   ALT 22 08/23/2022 1142   ALKPHOS 68 08/23/2022 1142   BILITOT 2.3 (H) 08/23/2022 1142   GFRNONAA >60 08/24/2022 0422   GFRAA 79 09/16/2020 1135   Lipase     Component Value Date/Time   LIPASE 33 08/23/2022 1142       Studies/Results: CT ABDOMEN PELVIS W CONTRAST  Result Date: 08/23/2022 CLINICAL DATA:  Stomach cramps and vomiting since Saturday, question hernia pain EXAM: CT ABDOMEN  AND PELVIS WITH CONTRAST TECHNIQUE: Multidetector CT imaging of the abdomen and pelvis was performed using the standard protocol following bolus administration of intravenous contrast. RADIATION DOSE REDUCTION: This exam was performed according to the departmental dose-optimization program which includes automated exposure control, adjustment of the mA and/or kV according to patient size and/or use of iterative reconstruction technique. CONTRAST:  19m OMNIPAQUE IOHEXOL 300 MG/ML SOLN IV. No oral contrast. COMPARISON:  06/07/2022 FINDINGS: Lower chest: Subsegmental atelectasis BILATERAL lower lobes greater on LEFT. Hepatobiliary: Hepatic cyst superiorly LEFT lobe 2.3 x 2.1 cm image 26 unchanged. Gallbladder and remainder of liver normal appearance. Pancreas: Normal appearance Spleen: Normal appearance Adrenals/Urinary Tract: Adrenal thickening without mass. Small BILATERAL renal cysts, largest LEFT kidney 2.1 cm diameter, all simple in character; no follow-up imaging recommended. Kidneys, ureters, and bladder otherwise normal appearance. Stomach/Bowel: Normal appendix. Extensive colonic diverticulosis, greatest at sigmoid colon, without evidence of diverticulitis. Stomach distended by fluid. Dilated proximal and decompressed distal small bowel loops consistent with small bowel obstruction, secondary to a small bowel loop within an umbilical hernia. Mild wall thickening of the segment within the hernia sac. No evidence of perforation. Vascular/Lymphatic: Atherosclerotic calcifications aorta and iliac arteries without aneurysm. Reproductive: Unremarkable prostate gland Other: Small amount of nonspecific free fluid in pelvis. Above-noted umbilical hernia containing fat and  a nonobstructed small bowel loop. No free air or abscess. Musculoskeletal: Degenerative disc/facet disease changes of lumbar spine with rotatory scoliosis and osseous demineralization. RIGHT hip prosthesis with beam hardening artifacts in pelvis.  Osteoarthritic changes LEFT hip joint. IMPRESSION: Small bowel obstruction secondary to an obstructed small bowel loop within an umbilical hernia. Small amount of nonspecific free pelvic fluid without evidence of perforation or abscess. Extensive colonic diverticulosis without evidence of diverticulitis. Bibasilar atelectasis greater on LEFT. Aortic Atherosclerosis (ICD10-I70.0). Electronically Signed   By: Lavonia Dana M.D.   On: 08/23/2022 12:58    Anti-infectives: Anti-infectives (From admission, onward)    Start     Dose/Rate Route Frequency Ordered Stop   08/24/22 0600  ceFAZolin (ANCEF) IVPB 2g/100 mL premix        2 g 200 mL/hr over 30 Minutes Intravenous On call to O.R. 08/23/22 1434 08/25/22 0559        Assessment/Plan SBO secondary to incarcerated umbilical hernia - Hernia remains reduced and patient without obstructive symptoms. Cardiology to see patient today. Continue holding xarelto. Planning for surgery tomorrow.   ID - none VTE - SCDs, hold xarelto/ discussed with cardiology and he does not need to be on heparin gtt FEN - IVF, HH diet, NPO after MN Foley - none   A fib on eliquis (last dose 10/9 in PM) and tikosyn MVP CHF - EF 65-70% on ECHO 07/27/2022 OSA on CPAP CAD HTN HLD AS s/p TAVR 07/26/2022   I reviewed last 24 h vitals and pain scores, last 48 h intake and output, last 24 h labs and trends, and last 24 h imaging results.    LOS: 0 days    Wellington Hampshire, [facility] 08/24/2022, 9:48 AM Please see Amion for pager number during day hours 7:00am-4:30pm

## 2022-08-25 ENCOUNTER — Inpatient Hospital Stay (HOSPITAL_COMMUNITY): Payer: Medicare Other | Admitting: Certified Registered Nurse Anesthetist

## 2022-08-25 ENCOUNTER — Encounter (HOSPITAL_COMMUNITY): Admission: EM | Disposition: A | Discharge status: Home / Self Care | Payer: Self-pay | Source: Ambulatory Visit

## 2022-08-25 ENCOUNTER — Encounter (HOSPITAL_COMMUNITY): Payer: Self-pay

## 2022-08-25 ENCOUNTER — Other Ambulatory Visit: Payer: Self-pay

## 2022-08-25 DIAGNOSIS — I10 Essential (primary) hypertension: Secondary | ICD-10-CM

## 2022-08-25 DIAGNOSIS — D649 Anemia, unspecified: Secondary | ICD-10-CM

## 2022-08-25 DIAGNOSIS — I2510 Atherosclerotic heart disease of native coronary artery without angina pectoris: Secondary | ICD-10-CM

## 2022-08-25 DIAGNOSIS — K429 Umbilical hernia without obstruction or gangrene: Secondary | ICD-10-CM

## 2022-08-25 DIAGNOSIS — Z87891 Personal history of nicotine dependence: Secondary | ICD-10-CM

## 2022-08-25 HISTORY — PX: UMBILICAL HERNIA REPAIR: SHX196

## 2022-08-25 SURGERY — REPAIR, HERNIA, UMBILICAL, ADULT
Anesthesia: General | Site: Abdomen

## 2022-08-25 MED ORDER — ACETAMINOPHEN 160 MG/5ML PO SOLN: 1000.0000 mg | Freq: Once | ORAL | Status: DC | PRN

## 2022-08-25 MED ORDER — ONDANSETRON HCL 4 MG/2ML IJ SOLN
INTRAMUSCULAR | Status: DC | PRN
  Administered 2022-08-25: 4 mg via INTRAVENOUS

## 2022-08-25 MED ORDER — ACETAMINOPHEN 10 MG/ML IV SOLN: 1000.0000 mg | Freq: Once | INTRAVENOUS | Status: DC | PRN

## 2022-08-25 MED ORDER — CEFAZOLIN SODIUM-DEXTROSE 2-3 GM-%(50ML) IV SOLR
INTRAVENOUS | Status: DC | PRN
  Administered 2022-08-25: 2 g via INTRAVENOUS

## 2022-08-25 MED ORDER — SUGAMMADEX SODIUM 500 MG/5ML IV SOLN
INTRAVENOUS | Status: AC
  Filled 2022-08-25: qty 5

## 2022-08-25 MED ORDER — CEFAZOLIN SODIUM-DEXTROSE 2-4 GM/100ML-% IV SOLN
INTRAVENOUS | Status: AC
  Filled 2022-08-25: qty 100

## 2022-08-25 MED ORDER — ACETAMINOPHEN 500 MG PO TABS: 1000.0000 mg | ORAL_TABLET | Freq: Once | ORAL | Status: DC | PRN

## 2022-08-25 MED ORDER — DEXAMETHASONE SODIUM PHOSPHATE 10 MG/ML IJ SOLN
INTRAMUSCULAR | Status: DC | PRN
  Administered 2022-08-25: 4 mg via INTRAVENOUS

## 2022-08-25 MED ORDER — FENTANYL CITRATE (PF) 100 MCG/2ML IJ SOLN
INTRAMUSCULAR | Status: DC | PRN
  Administered 2022-08-25 (×2): 50 ug via INTRAVENOUS

## 2022-08-25 MED ORDER — FENTANYL CITRATE (PF) 100 MCG/2ML IJ SOLN
INTRAMUSCULAR | Status: AC
  Filled 2022-08-25: qty 2

## 2022-08-25 MED ORDER — OXYCODONE HCL 5 MG/5ML PO SOLN: 5.0000 mg | Freq: Once | ORAL | Status: DC | PRN

## 2022-08-25 MED ORDER — PROPOFOL 10 MG/ML IV BOLUS
INTRAVENOUS | Status: DC | PRN
  Administered 2022-08-25: 130 mg via INTRAVENOUS

## 2022-08-25 MED ORDER — PROPOFOL 10 MG/ML IV BOLUS
INTRAVENOUS | Status: AC
  Filled 2022-08-25: qty 20

## 2022-08-25 MED ORDER — MORPHINE SULFATE (PF) 2 MG/ML IV SOLN: 2.0000 mg | INTRAVENOUS | Status: DC | PRN

## 2022-08-25 MED ORDER — BUPIVACAINE-EPINEPHRINE 0.25% -1:200000 IJ SOLN
INTRAMUSCULAR | Status: DC | PRN
  Administered 2022-08-25: 10 mL

## 2022-08-25 MED ORDER — AMIODARONE HCL 200 MG PO TABS
200.0000 mg | ORAL_TABLET | Freq: Every day | ORAL | Status: DC
  Administered 2022-08-25: 200 mg via ORAL
  Filled 2022-08-25 (×2): qty 1

## 2022-08-25 MED ORDER — ROCURONIUM BROMIDE 10 MG/ML (PF) SYRINGE
PREFILLED_SYRINGE | INTRAVENOUS | Status: DC | PRN
  Administered 2022-08-25: 80 mg via INTRAVENOUS

## 2022-08-25 MED ORDER — SUGAMMADEX SODIUM 500 MG/5ML IV SOLN
INTRAVENOUS | Status: DC | PRN
  Administered 2022-08-25: 300 mg via INTRAVENOUS

## 2022-08-25 MED ORDER — ACETAMINOPHEN 500 MG PO TABS
1000.0000 mg | ORAL_TABLET | Freq: Four times a day (QID) | ORAL | Status: DC | PRN
  Administered 2022-08-25: 1000 mg via ORAL
  Filled 2022-08-25: qty 2

## 2022-08-25 MED ORDER — EPHEDRINE SULFATE-NACL 50-0.9 MG/10ML-% IV SOSY
PREFILLED_SYRINGE | INTRAVENOUS | Status: DC | PRN
  Administered 2022-08-25: 10 mg via INTRAVENOUS

## 2022-08-25 MED ORDER — LACTATED RINGERS IV SOLN: INTRAVENOUS | Status: DC

## 2022-08-25 MED ORDER — 0.9 % SODIUM CHLORIDE (POUR BTL) OPTIME
TOPICAL | Status: DC | PRN
  Administered 2022-08-25: 1000 mL

## 2022-08-25 MED ORDER — ACETAMINOPHEN 10 MG/ML IV SOLN
INTRAVENOUS | Status: AC
  Filled 2022-08-25: qty 100

## 2022-08-25 MED ORDER — DEXAMETHASONE SODIUM PHOSPHATE 10 MG/ML IJ SOLN
INTRAMUSCULAR | Status: AC
  Filled 2022-08-25: qty 1

## 2022-08-25 MED ORDER — BUPIVACAINE-EPINEPHRINE (PF) 0.25% -1:200000 IJ SOLN
INTRAMUSCULAR | Status: AC
  Filled 2022-08-25: qty 30

## 2022-08-25 MED ORDER — FENTANYL CITRATE PF 50 MCG/ML IJ SOSY: 25.0000 ug | PREFILLED_SYRINGE | INTRAMUSCULAR | Status: DC | PRN

## 2022-08-25 MED ORDER — OXYCODONE HCL 5 MG PO TABS: 5.0000 mg | ORAL_TABLET | Freq: Once | ORAL | Status: DC | PRN

## 2022-08-25 MED ORDER — LIDOCAINE 2% (20 MG/ML) 5 ML SYRINGE: INTRAMUSCULAR | Status: DC | PRN

## 2022-08-25 MED ORDER — ONDANSETRON HCL 4 MG/2ML IJ SOLN
INTRAMUSCULAR | Status: AC
  Filled 2022-08-25: qty 2

## 2022-08-25 MED ORDER — ACETAMINOPHEN 10 MG/ML IV SOLN
INTRAVENOUS | Status: DC | PRN
  Administered 2022-08-25: 1000 mg via INTRAVENOUS

## 2022-08-25 SURGICAL SUPPLY — 35 items
ADH SKN CLS APL DERMABOND .7 (GAUZE/BANDAGES/DRESSINGS) ×1
APL PRP STRL LF DISP 70% ISPRP (MISCELLANEOUS) ×1
BAG COUNTER SPONGE SURGICOUNT (BAG) IMPLANT
BAG SPNG CNTER NS LX DISP (BAG) ×1
BINDER ABDOMINAL 12 ML 46-62 (SOFTGOODS) IMPLANT
BLADE SURG 15 STRL LF DISP TIS (BLADE) ×1 IMPLANT
BLADE SURG 15 STRL SS (BLADE) ×1
CHLORAPREP W/TINT 26 (MISCELLANEOUS) IMPLANT
DERMABOND ADVANCED .7 DNX12 (GAUZE/BANDAGES/DRESSINGS) IMPLANT
DRAPE LAPAROSCOPIC ABDOMINAL (DRAPES) ×1 IMPLANT
ELECT REM PT RETURN 15FT ADLT (MISCELLANEOUS) ×1 IMPLANT
GAUZE SPONGE 4X4 12PLY STRL (GAUZE/BANDAGES/DRESSINGS) ×1 IMPLANT
GLOVE BIO SURGEON STRL SZ7.5 (GLOVE) ×2 IMPLANT
GLOVE BIOGEL PI IND STRL 7.0 (GLOVE) ×1 IMPLANT
GOWN STRL REUS W/ TWL LRG LVL3 (GOWN DISPOSABLE) ×1 IMPLANT
GOWN STRL REUS W/ TWL XL LVL3 (GOWN DISPOSABLE) ×1 IMPLANT
GOWN STRL REUS W/TWL LRG LVL3 (GOWN DISPOSABLE) ×1
GOWN STRL REUS W/TWL XL LVL3 (GOWN DISPOSABLE) ×1
KIT BASIN OR (CUSTOM PROCEDURE TRAY) ×1 IMPLANT
KIT TURNOVER KIT A (KITS) IMPLANT
MESH VENTRALEX ST 2.5 CRC MED (Mesh General) IMPLANT
NEEDLE HYPO 22GX1.5 SAFETY (NEEDLE) ×1 IMPLANT
NS IRRIG 1000ML POUR BTL (IV SOLUTION) ×1 IMPLANT
PACK BASIC VI WITH GOWN DISP (CUSTOM PROCEDURE TRAY) ×1 IMPLANT
PENCIL SMOKE EVACUATOR (MISCELLANEOUS) IMPLANT
SUT MNCRL AB 4-0 PS2 18 (SUTURE) ×1 IMPLANT
SUT NOVA NAB DX-16 0-1 5-0 T12 (SUTURE) ×2 IMPLANT
SUT PROLENE 0 CT 1 30 (SUTURE) ×1 IMPLANT
SUT PROLENE 0 CT 1 CR/8 (SUTURE) IMPLANT
SUT VIC AB 2-0 SH 27 (SUTURE) ×2
SUT VIC AB 2-0 SH 27X BRD (SUTURE) ×2 IMPLANT
SYR BULB IRRIG 60ML STRL (SYRINGE) ×1 IMPLANT
SYR CONTROL 10ML LL (SYRINGE) ×1 IMPLANT
TOWEL OR 17X26 10 PK STRL BLUE (TOWEL DISPOSABLE) ×1 IMPLANT
WATER STERILE IRR 1000ML POUR (IV SOLUTION) ×1 IMPLANT

## 2022-08-25 NOTE — Discharge Instructions (Signed)
[facility] _______[facility]  UMBILICAL OR INGUINAL HERNIA REPAIR: POST OP INSTRUCTIONS  Always review your discharge instruction sheet given to you by the facility where your surgery was performed. IF YOU HAVE DISABILITY OR FAMILY LEAVE FORMS, YOU MUST BRING THEM TO THE OFFICE FOR PROCESSING.   DO NOT GIVE THEM TO YOUR DOCTOR.  1. A  prescription for pain medication may be given to you upon discharge.  Take your pain medication as prescribed, if needed.  If narcotic pain medicine is not needed, then you may take acetaminophen (Tylenol) as needed. 2. Take your usually prescribed medications unless otherwise directed. If you need a refill on your pain medication, please contact your pharmacy.  They will contact our office to request authorization. Prescriptions will not be filled after 5 pm or on week-ends. 3. You should follow a light diet the first 24 hours after arrival home, such as soup and crackers, etc.  Be sure to include lots of fluids daily.  Resume your normal diet the day after surgery. 4.Most patients will experience some swelling and bruising around the umbilicus or in the groin and scrotum.  Ice packs and reclining will help.  Swelling and bruising can take several days to resolve.  6. It is common to experience some constipation if taking pain medication after surgery.  Increasing fluid intake and taking a stool softener (such as Colace) will usually help or prevent this problem from occurring.  A mild laxative (Milk of Magnesia or Miralax) should be taken according to package directions if there are no bowel movements after 48 hours. 7. Unless discharge instructions indicate otherwise, you may remove your bandages 24-48 hours after surgery, and you may shower at that time.  You may have steri-strips (small skin tapes) in place directly over the incision.  These strips should be left on the skin for 7-10 days.  If your surgeon used skin glue on the incision, you may shower in  24 hours.  The glue will flake off over the next 2-3 weeks.  Any sutures or staples will be removed at the office during your follow-up visit. 8. ACTIVITIES:  You may resume regular (light) daily activities beginning the next day--such as daily self-care, walking, climbing stairs--gradually increasing activities as tolerated.  You may have sexual intercourse when it is comfortable.  Refrain from any heavy lifting or straining until approved by your doctor.  a.You may drive when you are no longer taking prescription pain medication, you can comfortably wear a seatbelt, and you can safely maneuver your car and apply brakes. b.RETURN TO WORK:   _____________________________________________  9.You should see your doctor in the office for a follow-up appointment approximately 2-3 weeks after your surgery.  Make sure that you call for this appointment within a day or two after you arrive home to insure a convenient appointment time. 10.OTHER INSTRUCTIONS: _________________________    _____________________________________  WHEN TO CALL YOUR DOCTOR: Fever over 101.0 Inability to urinate Nausea and/or vomiting Extreme swelling or bruising Continued bleeding from incision. Increased pain, redness, or drainage from the incision  The clinic staff is available to answer your questions during regular business hours.  Please don't hesitate to call and ask to speak to one of the nurses for clinical concerns.  If you have a medical emergency, go to the nearest emergency room or call 911.  A surgeon from [facility] is always on call at the hospital   [address] [place], [place], [place]  [address] ?  [address] [address], [place], [place]   [address] [phone] ? [phone] ? FAX [phone] Web site: [email or web address]  

## 2022-08-25 NOTE — Transfer of Care (Signed)
Immediate Anesthesia Transfer of Care Note  Patient: Patrick Franco  Procedure(s) Performed: UMBILICAL HERNIA REPAIR WITH POSSIBLE MESH (Abdomen)  Patient Location: PACU  Anesthesia Type:General  Level of Consciousness: awake and patient cooperative  Airway & Oxygen Therapy: Patient Spontanous Breathing and Patient connected to face mask oxygen  Post-op Assessment: Report given to RN and Post -op Vital signs reviewed and stable  Post vital signs: Reviewed and stable  Last Vitals:  Vitals Value Taken Time  BP 142/82 08/25/22 1520  Temp    Pulse 67 08/25/22 1522  Resp 14 08/25/22 1522  SpO2 94 % 08/25/22 1522  Vitals shown include unvalidated device data.  Last Pain:  Vitals:   08/25/22 0903  TempSrc:   PainSc: 0-No pain         Complications: No notable events documented.

## 2022-08-25 NOTE — TOC Progression Note (Signed)
Transition of Care [facility]) - Progression Note    Patient Details  Name: EMMONS TOTH MRN: [id number] Date of Birth: 1932/07/21  Transition of Care [facility]) CM/SW Contact  Servando Snare, [place] Phone Number: 08/25/2022, 9:25 AM  Clinical Narrative:    Transition of Care [facility]) Screening Note   Patient Details  Name: JLON BETKER Date of Birth: 19-Mar-1932   Transition of Care [facility]) CM/SW Contact:    Servando Snare, LCSW Phone Number: 08/25/2022, 9:25 AM  From [facility], independent at baseline. Some assistance with dressing.   Transition of Care Department [facility]) has reviewed patient and no TOC needs have been identified at this time. We will continue to monitor patient advancement through interdisciplinary progression rounds. If new patient transition needs arise, please place a TOC consult.          Expected Discharge Plan and Services                                                 Social Determinants of Health (SDOH) Interventions    Readmission Risk Interventions     No data to display

## 2022-08-25 NOTE — Progress Notes (Signed)
PT uses personal CPAP equip. Plugged into red out and cord has no frays.

## 2022-08-25 NOTE — Anesthesia Postprocedure Evaluation (Signed)
Anesthesia Post Note  Patient: Patrick Franco  Procedure(s) Performed: UMBILICAL HERNIA REPAIR WITH POSSIBLE MESH (Abdomen)     Patient location during evaluation: PACU Anesthesia Type: General Level of consciousness: awake and alert Pain management: pain level controlled Vital Signs Assessment: post-procedure vital signs reviewed and stable Respiratory status: spontaneous breathing, nonlabored ventilation, respiratory function stable and patient connected to nasal cannula oxygen Cardiovascular status: blood pressure returned to baseline and stable Postop Assessment: no apparent nausea or vomiting Anesthetic complications: no   No notable events documented.  Last Vitals:  Vitals:   08/25/22 1558 08/25/22 1610  BP: 118/62 (!) 144/72  Pulse: (!) 55 64  Resp: 14 18  Temp: 36.4 C 37.1 C  SpO2: 94% 94%    Last Pain:  Vitals:   08/25/22 1610  TempSrc: Oral  PainSc: 2                  ,

## 2022-08-25 NOTE — Op Note (Addendum)
08/23/2022 - 08/25/2022  1:29 PM  PATIENT:  Patrick Franco  88 y.o. male  PRE-OPERATIVE DIAGNOSIS:  UMBILICAL HERNIA  POST-OPERATIVE DIAGNOSIS:  UMBILICAL HERNIA  PROCEDURE:  Procedure(s): UMBILICAL HERNIA REPAIR WITH MESH (N/A)  SURGEON:  Surgeon(s) and Role:    * Jovita Kussmaul, MD - Primary  PHYSICIAN ASSISTANT:   ASSISTANTS: Wyonia Hough, PA   ANESTHESIA:   local and general  EBL:  minimal   BLOOD ADMINISTERED:none  DRAINS: none   LOCAL MEDICATIONS USED:  MARCAINE     SPECIMEN:  Source of Specimen:  hernia sac  DISPOSITION OF SPECIMEN:  PATHOLOGY  COUNTS:  YES  TOURNIQUET:  * No tourniquets in log *  DICTATION: .Dragon Dictation  After informed consent was obtained the patient was brought to the operating room and placed in the supine position on the operating table.  After adequate duction of general anesthesia the patient's abdomen was prepped with ChloraPrep, allowed to dry, draped in usual sterile manner.  An appropriate timeout was performed.  The area around the umbilicus was infiltrated with quarter percent Marcaine.  A small transversely oriented incision was made along the upper edge of the umbilicus with a 15 blade knife.  The incision was carried through the skin and subcutaneous tissue sharply with the electrocautery.  This dissection was carried all the way to the fascia of the abdominal wall.  The hernia sac was then opened.  There was only some fat within the hernia sac.  The hernia sac and fat were excised sharply with the electrocautery and the hernia sac was sent to pathology.  The fascial defect measured 3cm in diameter. A medium sized piece of umbilical hernia patch was chosen.  The mesh was anchored in 4 places through the abdominal wall with interrupted #1 Novafil stitches.  The mesh was then placed into the abdominal cavity and anchored up against the abdominal wall with the forced stitches.  At this point the mesh was in good position.  The fascia  was then closed with interrupted #1 Novafil stitches incorporating the anchors of the mesh.  Once this was accomplished and the hernia seem well repaired.  The fascial edges were healthy.  The wound was irrigated with saline.  The deep layer of the wound was then closed with interrupted 2-0 Vicryl stitches.  The skin was then closed with interrupted 4 Monocryl subcuticular stitches.  Dermabond dressings were applied.  The patient tolerated the procedure well.  At the end of the case all needle sponge and instrument counts were correct.  The patient was then awakened and taken to recovery in stable condition.  PLAN OF CARE: Admit to inpatient   PATIENT DISPOSITION:  PACU - hemodynamically stable.   Delay start of Pharmacological VTE agent (>24hrs) due to surgical blood loss or risk of bleeding: no

## 2022-08-25 NOTE — Anesthesia Preprocedure Evaluation (Signed)
Anesthesia Evaluation  Patient identified by MRN, date of birth, ID band Patient awake    Reviewed: Allergy & Precautions, NPO status , Patient's Chart, lab work & pertinent test results  History of Anesthesia Complications Negative for: history of anesthetic complications  Airway Mallampati: III  TM Distance: >3 FB Neck ROM: Limited    Dental  (+) Teeth Intact, Dental Advisory Given   Pulmonary shortness of breath, sleep apnea , former smoker,    breath sounds clear to auscultation       Cardiovascular hypertension, Pt. on medications and Pt. on home beta blockers (-) angina+ CAD  (-) Past MI + Valvular Problems/Murmurs  Rhythm:Regular    Ost RCA to Mid RCA lesion is 20% stenosed. .  Mid Cx lesion is 40% stenosed. Jorene Minors LAD lesion is 99% stenosed. .  Prox LAD to Mid LAD lesion is 30% stenosed.  1. The LAD has diffuse mild to moderate proximal and mid stenosis. There is a severe, heavily calcified stenosis in the mid to distal LAD. There appears to be a completely occluded branch just beyond this stenosis and then the LAD continues to the apex.  2. The Circumflex has a moderate mid stenosis 3. The RCA is a large dominant vessel with heavy calcification throughout. Mild plaque in the proximal and mid vessel.   Recommendations: His mid to distal LAD lesion is heavily calcified. I was unable to cross the lesion today. I think we should continue with workup for TAVR. Will treat his CAD medically for now. He has no angina. If we complete his TAVR and he continues to have dyspnea on exertion, can then consider complex PCI of the mid to distal LAD with orbital atherectomy and stenting.   1. 26 mm S3. Vmax 1.6 m/s, MG 5.5 mmHG, EOA 2.45, DI 0.57. No  regurgitation or paravalvular leak. Normal prosthesis. The aortic valve  has been repaired/replaced. Aortic valve regurgitation is not visualized.  There is a 26 mm Edwards Ultra, stented   (TAVR) valve present in the aortic position. Procedure Date: 07/26/22.  2. Left ventricular ejection fraction, by estimation, is 65 to 70%. The  left ventricle has normal function. The left ventricle has no regional  wall motion abnormalities. There is mild concentric left ventricular  hypertrophy. Left ventricular diastolic  function could not be evaluated.  3. Right ventricular systolic function is normal. The right ventricular  size is normal. There is mildly elevated pulmonary artery systolic  pressure. The estimated right ventricular systolic pressure is 81.4 mmHg.  4. Left atrial size was severely dilated.  5. The mitral valve is degenerative. Mild mitral valve regurgitation.  Mild to moderate mitral stenosis. Severe mitral annular calcification.  6. The inferior vena cava is normal in size with greater than 50%  respiratory variability, suggesting right atrial pressure of 3 mmHg.    Neuro/Psych PSYCHIATRIC DISORDERS Depression  Neuromuscular disease    GI/Hepatic Neg liver ROS, PUD, UMBILICAL HERNIA   Endo/Other  negative endocrine ROS  Renal/GU negative Renal ROSLab Results      Component                Value               Date                      CREATININE               0.75  08/24/2022                Musculoskeletal  (+) Arthritis ,   Abdominal   Peds  Hematology  (+) Blood dyscrasia, anemia , Lab Results      Component                Value               Date                      WBC                      6.3                 08/24/2022                HGB                      12.1 (L)            08/24/2022                HCT                      37.5 (L)            08/24/2022                MCV                      105.0 (H)           08/24/2022                PLT                      144 (L)             08/24/2022              Anesthesia Other Findings   Reproductive/Obstetrics                              Anesthesia Physical Anesthesia Plan  ASA: 3  Anesthesia Plan: General   Post-op Pain Management: Ofirmev IV (intra-op)*   Induction: Intravenous  PONV Risk Score and Plan: 2 and Ondansetron and Dexamethasone  Airway Management Planned: Oral ETT  Additional Equipment: None  Intra-op Plan:   Post-operative Plan: Extubation in OR  Informed Consent: I have reviewed the patients History and Physical, chart, labs and discussed the procedure including the risks, benefits and alternatives for the proposed anesthesia with the patient or authorized representative who has indicated his/her understanding and acceptance.     Dental advisory given  Plan Discussed with: CRNA  Anesthesia Plan Comments:         Anesthesia Quick Evaluation

## 2022-08-25 NOTE — Anesthesia Procedure Notes (Deleted)
Procedure Name: Intubation Date/Time: 08/25/2022 2:01 PM  Performed by: West Pugh, CRNAPre-anesthesia Checklist: Patient identified, Emergency Drugs available, Suction available, Patient being monitored and Timeout performed Patient Re-evaluated:Patient Re-evaluated prior to induction Oxygen Delivery Method: Circle system utilized Preoxygenation: Pre-oxygenation with 100% oxygen Induction Type: IV induction Ventilation: Mask ventilation without difficulty Laryngoscope Size: 3 and Miller Grade View: Grade I Tube type: Oral Airway Equipment and Method: Stylet Placement Confirmation: ETT inserted through vocal cords under direct vision, positive ETCO2, CO2 detector and breath sounds checked- equal and bilateral Secured at: 22 cm Tube secured with: Tape Dental Injury: Teeth and Oropharynx as per pre-operative assessment  Comments: Intubation performed by Dr Ermalene Postin

## 2022-08-25 NOTE — Interval H&P Note (Signed)
History and Physical Interval Note:  08/25/2022 1:12 PM  Patrick Franco  has presented today for surgery, with the diagnosis of UMBILICAL HERNIA.  The various methods of treatment have been discussed with the patient and family. After consideration of risks, benefits and other options for treatment, the patient has consented to  Procedure(s): UMBILICAL HERNIA REPAIR WITH POSSIBLE MESH (N/A) as a surgical intervention.  The patient's history has been reviewed, patient examined, no change in status, stable for surgery.  I have reviewed the patient's chart and labs.  Questions were answered to the patient's satisfaction.     [name]

## 2022-08-25 NOTE — Progress Notes (Signed)
Rounding Note    Patient Name: Patrick Franco Date of Encounter: 08/25/2022  [facility] Cardiologist: [name], MD   Subjective   Patient denies any chest pain, palpitations, sob, dizziness/lightheadedness, syncope or near syncope. Surgery today   Inpatient Medications    Scheduled Meds:  allopurinol  200 mg Oral Daily   atorvastatin  10 mg Oral q morning   diltiazem  240 mg Oral Daily   docusate sodium  100 mg Oral BID   dutasteride  0.5 mg Oral q morning   metoprolol tartrate  25 mg Oral BID   mirtazapine  30 mg Oral QHS   pantoprazole  40 mg Oral Daily   spironolactone  12.5 mg Oral Daily   tamsulosin  0.4 mg Oral Daily   Continuous Infusions:  lactated ringers 50 mL/hr at 08/25/22 0218   methocarbamol (ROBAXIN) IV     PRN Meds: acetaminophen **OR** acetaminophen, diphenhydrAMINE **OR** diphenhydrAMINE, methocarbamol **OR** methocarbamol (ROBAXIN) IV, metoprolol tartrate, morphine injection, ondansetron **OR** ondansetron (ZOFRAN) IV, oxyCODONE, polyethylene glycol, simethicone   Vital Signs    Vitals:   08/24/22 2007 08/24/22 2030 08/24/22 2204 08/25/22 0540  BP: 128/64   (!) 105/56  Pulse: 74 74  79  Resp: '18 18  18  '$ Temp: 98.1 F (36.7 C)   98 F (36.7 C)  TempSrc: Oral   Oral  SpO2: 90% 92%  97%  Weight:   96 kg   Height:   '5\' 9"'$  (1.753 m)     Intake/Output Summary (Last 24 hours) at 08/25/2022 0858 Last data filed at 08/25/2022 0600 Gross per 24 hour  Intake 2373.2 ml  Output 1200 ml  Net 1173.2 ml      08/24/2022   10:04 PM 08/05/2022   11:02 AM 07/27/2022    5:00 AM  Last 3 Weights  Weight (lbs) 211 lb 10.3 oz 212 lb 212 lb 15.4 oz  Weight (kg) 96 kg 96.163 kg 96.6 kg      Telemetry    Predominantly NSR, sinus arrhythmia. There was an episode of wide complex tachycardia that lasted 25 beats at 2313 yesterday evening.  - Personally Reviewed  ECG     Sinus rhythm with first degree AV block, QT/QTcB 384/448 ms, PVC  present- Personally Reviewed  Physical Exam   GEN: No acute distress. Resting comfortably in the bed in no acute distress  Neck: No JVD Cardiac: RRR, no murmurs, rubs, or gallops.  Respiratory: End expiratory wheezing, otherwise clear to auscultation bilaterally. Normal WOB on room air  GI: Soft, nontender, mildly distended  MS: No edema; No deformity. Neuro:  Nonfocal  Psych: Normal affect   Labs    High Sensitivity Troponin:  No results for input(s): "TROPONINIHS" in the last 720 hours.   Chemistry Recent Labs  Lab 08/23/22 1142 08/24/22 0422  NA 139 138  K 4.5 4.2  CL 104 108  CO2 24 23  GLUCOSE 137* 94  BUN 26* 25*  CREATININE 1.09 0.75  CALCIUM 9.8 8.3*  PROT 7.5  --   ALBUMIN 4.6  --   AST 26  --   ALT 22  --   ALKPHOS 68  --   BILITOT 2.3*  --   GFRNONAA >60 >60  ANIONGAP 11 7    Lipids No results for input(s): "CHOL", "TRIG", "HDL", "LABVLDL", "LDLCALC", "CHOLHDL" in the last 168 hours.  Hematology Recent Labs  Lab 08/23/22 1142 08/24/22 0422  WBC 11.3* 6.3  RBC 4.77 3.57*  HGB 16.2 12.1*  HCT 48.4 37.5*  MCV 101.5* 105.0*  MCH 34.0 33.9  MCHC 33.5 32.3  RDW 14.2 14.3  PLT 205 144*   Thyroid No results for input(s): "TSH", "FREET4" in the last 168 hours.  BNPNo results for input(s): "BNP", "PROBNP" in the last 168 hours.  DDimer No results for input(s): "DDIMER" in the last 168 hours.   Radiology    CT ABDOMEN PELVIS W CONTRAST  Result Date: 08/23/2022 CLINICAL DATA:  Stomach cramps and vomiting since Saturday, question hernia pain EXAM: CT ABDOMEN AND PELVIS WITH CONTRAST TECHNIQUE: Multidetector CT imaging of the abdomen and pelvis was performed using the standard protocol following bolus administration of intravenous contrast. RADIATION DOSE REDUCTION: This exam was performed according to the departmental dose-optimization program which includes automated exposure control, adjustment of the mA and/or kV according to patient size and/or use of  iterative reconstruction technique. CONTRAST:  167m OMNIPAQUE IOHEXOL 300 MG/ML SOLN IV. No oral contrast. COMPARISON:  06/07/2022 FINDINGS: Lower chest: Subsegmental atelectasis BILATERAL lower lobes greater on LEFT. Hepatobiliary: Hepatic cyst superiorly LEFT lobe 2.3 x 2.1 cm image 26 unchanged. Gallbladder and remainder of liver normal appearance. Pancreas: Normal appearance Spleen: Normal appearance Adrenals/Urinary Tract: Adrenal thickening without mass. Small BILATERAL renal cysts, largest LEFT kidney 2.1 cm diameter, all simple in character; no follow-up imaging recommended. Kidneys, ureters, and bladder otherwise normal appearance. Stomach/Bowel: Normal appendix. Extensive colonic diverticulosis, greatest at sigmoid colon, without evidence of diverticulitis. Stomach distended by fluid. Dilated proximal and decompressed distal small bowel loops consistent with small bowel obstruction, secondary to a small bowel loop within an umbilical hernia. Mild wall thickening of the segment within the hernia sac. No evidence of perforation. Vascular/Lymphatic: Atherosclerotic calcifications aorta and iliac arteries without aneurysm. Reproductive: Unremarkable prostate gland Other: Small amount of nonspecific free fluid in pelvis. Above-noted umbilical hernia containing fat and a nonobstructed small bowel loop. No free air or abscess. Musculoskeletal: Degenerative disc/facet disease changes of lumbar spine with rotatory scoliosis and osseous demineralization. RIGHT hip prosthesis with beam hardening artifacts in pelvis. Osteoarthritic changes LEFT hip joint. IMPRESSION: Small bowel obstruction secondary to an obstructed small bowel loop within an umbilical hernia. Small amount of nonspecific free pelvic fluid without evidence of perforation or abscess. Extensive colonic diverticulosis without evidence of diverticulitis. Bibasilar atelectasis greater on LEFT. Aortic Atherosclerosis (ICD10-I70.0). Electronically Signed    By: MLavonia DanaM.D.   On: 08/23/2022 12:58    Cardiac Studies   Echocardiogram 07/27/22  1. 26 mm S3. Vmax 1.6 m/s, MG 5.5 mmHG, EOA 2.45, DI 0.57. No  regurgitation or paravalvular leak. Normal prosthesis. The aortic valve  has been repaired/replaced. Aortic valve regurgitation is not visualized.  There is a 26 mm Edwards Ultra, stented  (TAVR) valve present in the aortic position. Procedure Date: 07/26/22.   2. Left ventricular ejection fraction, by estimation, is 65 to 70%. The  left ventricle has normal function. The left ventricle has no regional  wall motion abnormalities. There is mild concentric left ventricular  hypertrophy. Left ventricular diastolic  function could not be evaluated.   3. Right ventricular systolic function is normal. The right ventricular  size is normal. There is mildly elevated pulmonary artery systolic  pressure. The estimated right ventricular systolic pressure is 365.4mmHg.   4. Left atrial size was severely dilated.   5. The mitral valve is degenerative. Mild mitral valve regurgitation.  Mild to moderate mitral stenosis. Severe mitral annular calcification.   6. The inferior vena  cava is normal in size with greater than 50%  respiratory variability, suggesting right atrial pressure of 3 mmHg.   Patient Profile     88 y.o. male with a hx of atrial fibrillation on eliquis and tikosyn, MVP, CHF, OSA on CAP, CAD, HTN, HLD, AS s.p TAVR 07/26/2022 who is being seen for preoperative evaluation at the request of general surgery   Assessment & Plan    Perioperative risk evaluation - According to the Revised Cardiac Risk Index, patient has a 10.1% 30-day risk of death, MI, or cardiac arrest due to his history of ischemic hear disease and CHF. Additionally, patient has a history of atrial fibrillation, OSA that further increase his risk of perioperative complications  - Patient is able to walk up and down stairs, do gentle piliates, and exercise in a pool without  chest pain, severe sob.  - Most recent cardiac catheterization from 05/26/2022 showed 99% stenosis of the distal LAD. Dr. Angelena Form was unable to cross the lesion so he recommended medical management. Also showed 40% stenosis of the mid Cx, 30% stenosis of the prox-mid LAD, and 20% stenosis of the ost-mid RCA  - Patient underwent TAVR on 07/26/22. Echo post-procedure showed a normal aortic prosthesis, no aortic valve regurgitation, LVEF 65-70%, no regional wall motion abnormalities , mild LVH, normal RV systolic function, severely dialted LA, mild mitral valve regurgitation, mild-moderate mitral stenosis.  - No plans for further cardiac workup prior to surgery    Aortic stenosis s/p TAVR 07/26/22 - Echo on 9/13 post-procedure showed normal prosthesis, no aortic regurgitation. 26 mm S3. Vmax 1.6 m/s, MG 5.5 mmHG, EOA 2.45, DI 0.57. - 6 week echo scheduled from 10/20. Discussed with MD, no plan to perform echo prior to surgery    Paroxysmal atrial fibrillation PVCs, V-Tach  - Patient on Tikosyn prior to admission, did not take either dose yesterday. Discussed with EP who recommended stopping Tikosyn. Should hold until patient is seen in the office  - Per telemetry, patient is maintaining NSR with HR in the 50s. Has sinus arrhythmia overnight with infrequent brief pauses, longest pause lasting 1.3 seconds. Suspect this is secondary to sleep apnea   - Patient also had PVCs overnight and a 25-beat run of NSVT yesterday evening at 2313. Patient was asymptomatic. Will review telemetry with MD  - Maintain K>4, mag>2  - Continue lopressor 25 mg BID, diltiazem 240 mg daily  - Eliquis held for surgery, no need to bridge with heparin   Chronic diastolic heart failure  - Most recent echo with mild LVH, LVEF 65-70% - Patient euvolemic on exam  - Continue spironolactone 12.5 mg daily  - PRN lasix    For questions or updates, please contact [facility] Please consult [email or web address] for contact info  under        Signed, Margie Billet, PA-C  08/25/2022, 8:58 AM

## 2022-08-25 NOTE — Progress Notes (Signed)
Mobility Specialist - Progress Note   08/25/22 1153  Mobility  Activity Ambulated independently in hallway  Level of Assistance Independent  Assistive Device  (IV Pole)  Distance Ambulated (ft) 500 ft  Range of Motion/Exercises Active  Activity Response Tolerated well  Mobility Referral Yes  $Mobility charge 1 Mobility   Pt received in bed and agreeable to mobility. No complaints during mobility. Pt to EOB after session with all needs met & wife in room.  [facility]

## 2022-08-25 NOTE — Addendum Note (Signed)
Addendum  created 08/25/22 1749 by West Pugh, CRNA   Child order released for a procedure order, Clinical Note Signed, Delete clinical note, Intraprocedure Blocks edited, Order Canceled from Note, SmartForm saved

## 2022-08-26 ENCOUNTER — Encounter (HOSPITAL_COMMUNITY): Payer: Self-pay | Admitting: General Surgery

## 2022-08-26 LAB — SURGICAL PATHOLOGY

## 2022-08-26 MED ORDER — OXYCODONE HCL 5 MG PO TABS: 2.5000 mg | ORAL_TABLET | Freq: Four times a day (QID) | ORAL | 0 refills | Status: DC | PRN

## 2022-08-26 MED ORDER — AMIODARONE HCL 200 MG PO TABS: 200.0000 mg | ORAL_TABLET | Freq: Every day | ORAL | 1 refills | Status: DC

## 2022-08-26 NOTE — Progress Notes (Signed)
Discharge instructions discussed with patient and wife, verbalized agreement and understanding

## 2022-08-26 NOTE — Progress Notes (Signed)
1 Day Post-Op   Subjective/Chief Complaint: No complaints   Objective: Vital signs in last 24 hours: Temp:  [97.6 F (36.4 C)-98.7 F (37.1 C)] 97.6 F (36.4 C) (10/13 0150) Pulse Rate:  [55-75] 56 (10/13 0150) Resp:  [12-18] 18 (10/13 0150) BP: (118-150)/(60-85) 129/75 (10/13 0150) SpO2:  [90 %-97 %] 92 % (10/13 0150) Weight:  [96 kg] 96 kg (10/12 1305) Last BM Date : 08/23/22  Intake/Output from previous day: 10/12 0701 - 10/13 0700 In: 2593.1 [P.O.:180; I.V.:2363.1; IV Piggyback:50] Out: 850 [Urine:850] Intake/Output this shift: No intake/output data recorded.  General appearance: alert and cooperative Resp: clear to auscultation bilaterally Cardio: regular rate and rhythm GI: soft, minimal tenderness  Lab Results:  Recent Labs    08/23/22 1142 08/24/22 0422  WBC 11.3* 6.3  HGB 16.2 12.1*  HCT 48.4 37.5*  PLT 205 144*   BMET Recent Labs    08/23/22 1142 08/24/22 0422  NA 139 138  K 4.5 4.2  CL 104 108  CO2 24 23  GLUCOSE 137* 94  BUN 26* 25*  CREATININE 1.09 0.75  CALCIUM 9.8 8.3*   PT/INR No results for input(s): "LABPROT", "INR" in the last 72 hours. ABG No results for input(s): "PHART", "HCO3" in the last 72 hours.  Invalid input(s): "PCO2", "PO2"  Studies/Results: No results found.  Anti-infectives: Anti-infectives (From admission, onward)    Start     Dose/Rate Route Frequency Ordered Stop   08/25/22 1333  ceFAZolin (ANCEF) 2-4 GM/100ML-% IVPB       Note to Pharmacy: Christell Faith L: cabinet override      08/25/22 1333 08/26/22 0144   08/24/22 0600  ceFAZolin (ANCEF) IVPB 2g/100 mL premix  Status:  Discontinued        2 g 200 mL/hr over 30 Minutes Intravenous On call to O.R. 08/23/22 1434 08/25/22 0559       Assessment/Plan: s/p Procedure(s): UMBILICAL HERNIA REPAIR WITH POSSIBLE MESH (N/A) Advance diet Plan to d/c today Restart blood thinner Will start amiodarone until follow up with cardiology  LOS: 2 days    Autumn Messing  III 08/26/2022

## 2022-08-29 NOTE — Discharge Summary (Signed)
Patient ID: ABDOULAYE DRUM [id number] Jun 12, 1932 88 y.o.  Admit date: 08/23/2022 Discharge date: 08/29/2022  Discharge Diagnosis SBO secondary to incarcerated umbilical hernia s/p umbilical hernia repair with mesh by Dr. Marlou Starks on 08/25/22 A fib on eliquis MVP CHF - EF 65-70% on ECHO 07/27/2022 OSA on CPAP CAD HTN HLD AS s/p TAVR 07/26/2022  Consultants Cards  Reason for Admission: IBRAHIM MCPHEETERS is a 88 y.o. male PMH A fib on eliquis (last dose 10/9 in PM) and tikosyn, MVP, CHF, OSA on CPAP, CAD, HTN, HLD, and AS s/p TAVR 07/26/2022 who presented to [facility] today complaining of worsening pain at site of known umbilical hernia. States that he has had this hernia for several years. It has popped out before but he is always able to self reduce. States that this time his symptoms started Saturday and have gotten worse. He has had some nausea, burping, and vomited once this morning. He did have a bowel movement yesterday but is not passing any flatus.  In the ED he underwent CT scan which reports small bowel obstruction secondary to an obstructed small bowel loop within an umbilical hernia; small amount of nonspecific free pelvic fluid without evidence of perforation or abscess. General surgery asked to see.   Abdominal surgical history: none  Procedures Dr. Marlou Starks - Umbilical hernia repair with mesh - 08/25/22  Hospital Course:  Patient presented as above with worsening pain at umbilicus where he had a known umbilical hernia. CT showed sbo 2/2 obstructed small bowel loop within an umbilical hernia. Our team was consulted. Umbilical hernia was able to be reduced at bedside. He was admitted to allow xarelto to wash out before surgery. Cards was consulted for optimization and clearance. He was taken to the OR by Dr. Marlou Starks for umbilical hernia repair with mesh on 10/12 as noted above. Tolerated the procedure well. Diet was advanced and tolerated. He was felt to be stable for discharge on POD 1, 10/13 as  noted in progress notes. He is to hold Tikosyn at d/c and take amiodarone until follow up with cardiology.   I was not directly involved in this patient's care on day of discharge and did not see the patient, therefore the information in this discharge summary was taken entirely from the chart review. Please see MDs progress note from day of d/c.   Allergies as of 08/26/2022   No Known Allergies      Medication List     STOP taking these medications    dofetilide 500 MCG capsule Commonly known as: TIKOSYN       TAKE these medications    allopurinol 100 MG tablet Commonly known as: ZYLOPRIM Take 2 tablets (200 mg total) by mouth daily.   amiodarone 200 MG tablet Commonly known as: PACERONE Take 1 tablet (200 mg total) by mouth daily.   amoxicillin 500 MG tablet Commonly known as: AMOXIL Take 4 tablets (2,000 mg total) by mouth as directed. 1 hour prior to dental work including cleanings What changed:  when to take this additional instructions   apixaban 5 MG Tabs tablet Commonly known as: Eliquis Take 1 tablet (5 mg total) by mouth 2 (two) times daily.   atorvastatin 10 MG tablet Commonly known as: LIPITOR Take 10 mg by mouth every morning.   cyanocobalamin 1000 MCG tablet Commonly known as: VITAMIN B12 Take 1,000 mcg by mouth daily.   diltiazem 240 MG 24 hr capsule Commonly known as: CARDIZEM CD TAKE (1) CAPSULE DAILY. What  changed: See the new instructions.   dutasteride 0.5 MG capsule Commonly known as: AVODART Take 0.5 mg by mouth every morning.   Fish Oil 1200 MG Caps Take 1,200 mg by mouth daily.   Fluad Quadrivalent 0.5 ML injection Generic drug: influenza vaccine adjuvanted Inject into the muscle.   Flutter Devi Use the flutter valve 10 times when doing it daily.   furosemide 40 MG tablet Commonly known as: LASIX Take 60 mg by mouth 4 (four) times a week.   metoprolol tartrate 25 MG tablet Commonly known as: LOPRESSOR Take 1 tablet (25  mg total) by mouth daily.   mirtazapine 30 MG tablet Commonly known as: REMERON TAKE ONE TABLET AT BEDTIME.   multivitamin tablet Take 1 tablet by mouth daily.   oxyCODONE 5 MG immediate release tablet Commonly known as: Oxy IR/ROXICODONE Take 0.5-1 tablets (2.5-5 mg total) by mouth every 6 (six) hours as needed for moderate pain or severe pain (2.'5mg'$  moderate, '5mg'$  severe).   pantoprazole 40 MG tablet Commonly known as: Protonix Take 1 tablet (40 mg total) by mouth daily.   potassium chloride SA 20 MEQ tablet Commonly known as: KLOR-CON M TAKE 1 TABLET BY MOUTH IN THE MORNING, TAKE 1/2 TABLET BY MOUTH IN THE P.M. What changed:  how much to take how to take this when to take this additional instructions   PROBIOTIC DAILY PO Take 1 tablet by mouth daily.   pyridOXINE 100 MG tablet Commonly known as: VITAMIN B6 Take 100 mg by mouth daily.   spironolactone 25 MG tablet Commonly known as: ALDACTONE Take 0.5 tablets (12.5 mg total) by mouth daily.   Systane 0.4-0.3 % Soln Generic drug: Polyethyl Glycol-Propyl Glycol Place 1 drop into both eyes 2 (two) times daily.   tamsulosin 0.4 MG Caps capsule Commonly known as: FLOMAX Take 0.4 mg by mouth daily after breakfast.   Turmeric 500 MG Caps Take 500 mg by mouth daily.          Follow-up Information     [facility] [place] Follow up on 09/13/2022.   Specialty: General Surgery Why: 09/13/22 at 10:30 am. Please bring a copy of your photo ID and insurnace card. Please arrive to your appointment 30 minutes early for paperwork. Contact information: [address] [place] [address] [phone]                 Signed: Alferd Apa, [facility] 08/29/2022, 3:22 PM Please see Amion for pager number during day hours 7:00am-4:30pm

## 2022-08-31 ENCOUNTER — Telehealth: Payer: Self-pay | Admitting: Cardiovascular Disease

## 2022-08-31 NOTE — Telephone Encounter (Signed)
*  STAT* If patient is at the pharmacy, call can be transferred to refill team.   1. Which medications need to be refilled? (please list name of each medication and dose if known)  Paxlovid  2. Which pharmacy/location (including street and city if local pharmacy) is medication to be sent to? [facility], [name]   3. Do they need a 30 day or 90 day supply?   Patient's wife states on Monday, 10/16 the patient developed a cough/frequent sneezing, chest congestion, and tested negative for COVID. Today, 10/18 patient tested positive for COVID and patient's wife would like for patient to start on Paxlovid ASAP.  Paitent's wife states she is at [facility] now.

## 2022-09-01 ENCOUNTER — Other Ambulatory Visit: Payer: Self-pay | Admitting: Physician Assistant

## 2022-09-01 MED ORDER — MOLNUPIRAVIR EUA 200MG CAPSULE: 4.0000 | ORAL_CAPSULE | Freq: Two times a day (BID) | ORAL | 0 refills | Status: AC

## 2022-09-01 NOTE — Telephone Encounter (Signed)
Patient will need to contact PCP for Paxlovid Rx

## 2022-09-01 NOTE — Telephone Encounter (Signed)
Patient will not be able to take Paxlovid if he is on amiodarone. Will not be able to hold amiodarone. Recommend she ask PCP for Lagevrio (monlupiravir)

## 2022-09-01 NOTE — Telephone Encounter (Signed)
Called patient's wife back about message. PCP would start paxlovid, but worried about giving it with eliquis and amiodarone. Will send message to our pharmacist for advisement.

## 2022-09-01 NOTE — Telephone Encounter (Signed)
Called patient with recommendations. Patient verbalized understanding. Will send recommendation through mychart as well.

## 2022-09-02 ENCOUNTER — Encounter (HOSPITAL_COMMUNITY): Payer: Self-pay

## 2022-09-02 ENCOUNTER — Ambulatory Visit: Payer: Medicare Other

## 2022-09-02 ENCOUNTER — Other Ambulatory Visit (HOSPITAL_COMMUNITY): Payer: Medicare Other

## 2022-09-05 ENCOUNTER — Other Ambulatory Visit: Payer: Self-pay | Admitting: Physician Assistant

## 2022-09-05 ENCOUNTER — Other Ambulatory Visit: Payer: Self-pay | Admitting: Internal Medicine

## 2022-09-06 ENCOUNTER — Other Ambulatory Visit: Payer: Self-pay

## 2022-09-06 MED ORDER — METOPROLOL TARTRATE 25 MG PO TABS: 25.0000 mg | ORAL_TABLET | Freq: Every day | ORAL | 3 refills | Status: DC

## 2022-09-06 MED ORDER — POTASSIUM CHLORIDE CRYS ER 20 MEQ PO TBCR: EXTENDED_RELEASE_TABLET | ORAL | 3 refills | Status: DC

## 2022-09-07 DIAGNOSIS — K56691 Other complete intestinal obstruction: Secondary | ICD-10-CM | POA: Diagnosis not present

## 2022-09-07 DIAGNOSIS — I10 Essential (primary) hypertension: Secondary | ICD-10-CM | POA: Diagnosis not present

## 2022-09-07 DIAGNOSIS — I700 Atherosclerosis of aorta: Secondary | ICD-10-CM | POA: Diagnosis not present

## 2022-09-07 DIAGNOSIS — K420 Umbilical hernia with obstruction, without gangrene: Secondary | ICD-10-CM | POA: Diagnosis not present

## 2022-09-07 DIAGNOSIS — I484 Atypical atrial flutter: Secondary | ICD-10-CM | POA: Diagnosis not present

## 2022-09-08 ENCOUNTER — Other Ambulatory Visit (HOSPITAL_COMMUNITY): Payer: Self-pay

## 2022-09-08 ENCOUNTER — Encounter (HOSPITAL_COMMUNITY): Payer: Self-pay | Admitting: Physician Assistant

## 2022-09-08 ENCOUNTER — Ambulatory Visit (HOSPITAL_COMMUNITY)
Admit: 2022-09-08 | Discharge: 2022-09-08 | Disposition: A | Discharge status: Home / Self Care | Payer: Medicare Other | Attending: Physician Assistant | Admitting: Physician Assistant

## 2022-09-08 VITALS — BP 122/68 | HR 69 | Ht 69.0 in | Wt 209.9 lb

## 2022-09-08 DIAGNOSIS — I110 Hypertensive heart disease with heart failure: Secondary | ICD-10-CM | POA: Diagnosis not present

## 2022-09-08 DIAGNOSIS — Z7901 Long term (current) use of anticoagulants: Secondary | ICD-10-CM | POA: Diagnosis not present

## 2022-09-08 DIAGNOSIS — I509 Heart failure, unspecified: Secondary | ICD-10-CM | POA: Diagnosis not present

## 2022-09-08 DIAGNOSIS — I4819 Other persistent atrial fibrillation: Secondary | ICD-10-CM | POA: Insufficient documentation

## 2022-09-08 DIAGNOSIS — I350 Nonrheumatic aortic (valve) stenosis: Secondary | ICD-10-CM | POA: Diagnosis not present

## 2022-09-08 DIAGNOSIS — I2510 Atherosclerotic heart disease of native coronary artery without angina pectoris: Secondary | ICD-10-CM | POA: Insufficient documentation

## 2022-09-08 DIAGNOSIS — D6869 Other thrombophilia: Secondary | ICD-10-CM | POA: Diagnosis not present

## 2022-09-08 DIAGNOSIS — Z8711 Personal history of peptic ulcer disease: Secondary | ICD-10-CM | POA: Insufficient documentation

## 2022-09-08 LAB — TSH: TSH: 2.257 u[IU]/mL (ref 0.350–4.500)

## 2022-09-08 MED ORDER — AMIODARONE HCL 200 MG PO TABS: 200.0000 mg | ORAL_TABLET | Freq: Every day | ORAL | 1 refills | Status: DC

## 2022-09-08 NOTE — Progress Notes (Signed)
Primary Care Physician: Lavone Orn, MD Referring Physician: Richardson Dopp, PA Cardiologist: Dr Angelena Form EP: Dr Rayann Heman   Patrick Franco is a 88 y.o. male with a h/o HTN, previous gastric ulcer with hemorrhage, moderate aortic stenosis, h/o CHF, CAD, afib on Tikosyn. He is in the afib clinic as he had a spell of dyspnea at bedtime last night and called EMS. He had just went to bed and was putting  on his CPAP and felt winded. He sat up and tried CPAP again and became short of breath again told his wife to call EMS. The fire department was there in a few minutes when he was still feeling short of breath and placed on monitor which showed SR. EMS arrived, EKG was done and it showed SR as well. His V/S were normal. It was not advised for him to go to the ER. He immediately felt better.  He  states that he may have panicked a little last night.  Episode lasted 25-30 mins. He also states that he has not felt well for the last few months. His PCP called this am to tell him that his HGB has dropped to 10.3 from 14.9 10/22 and is trying to get GI appointment asap. He has noted black stools.  He had a stress test 10/6 which was low risk and an echo in September did show progression of aortic stenosis from mild to moderate.   He is in SR today with stable BP. His weight today is 208 lbs and it was 218 lbs in September.    F/u in afib clinic, 12/29/21. He is staying in [place] with stable qt. He did have a GI w/u last fall and was found to have a gastric ulcer. He was placed on a PPI, and oral iron and now CBC has returned to normal. He has not had any afib. No further dyspnea spells as described above.   F/u in afib clinic, 07/12/22. He is getting ready to have Aortic stenosis addressed, pending TAVR,  he is seeing Dr. Cyndia Bent 9/13. He is in SR with bigeminal PVC's today. Tikosyn labs are pending. He takes his metoprolol and his spironolactone at noon and feels wiped out after that. His BP is soft today at 104/56. He  has had PVC's in the past but I have not seen bigeminal PVC's. Qt stable at 480 ms. He is unaware.    Follow up in the AF clinic 09/08/22. Patient underwent TAVR on 07/26/22. He presented to the ED 08/23/22 with abdominal pain and was found to have a small bowel obstruction secondary to an umbilical hernia. He underwent an umbilical hernia repair 18/84/16. His dofetilide was held during admission as he was NPO. Decision was made with EP team to discontinue dofetilide and start amiodarone. He feels well today and remains in SR.  Today, he denies symptoms of palpitations, chest pain, shortness of breath, orthopnea, PND, lower extremity edema, dizziness, presyncope, syncope, or neurologic sequela. The patient is tolerating medications without difficulties and is otherwise without complaint today.   Past Medical History:  Diagnosis Date   Aortic stenosis    Arthritis    Atrial fibrillation (HCC)    BPH (benign prostatic hyperplasia)    Chronic low back pain    Depression    Diverticulitis    Dyspnea    Elevated PSA    Foot drop, right    Gait disorder 02/03/2015   Hip pain    History of colonoscopy    History  of nuclear stress test    Myoview 10/22: EF 67, no ischemia or infarction; low risk   HOH (hard of hearing)    hearing aid   Hypertension    Lumbar radiculopathy    Mitral valve prolapse    Onychomycosis    Peptic ulcer disease    S/P TAVR (transcatheter aortic valve replacement) 07/26/2022   29m S3UR via TF approach with Dr. MAngelena Formand Dr. BRandolm Idol  Sepsis ([facility] 2005   e. coli-after prostate bx   Sleep apnea    Torn ACL    Tracheobronchomalacia    seen on 2019 Chest CT   Past Surgical History:  Procedure Laterality Date   BIOPSY  10/05/2021   Procedure: BIOPSY;  Surgeon: KRonnette Juniper MD;  [facility];  Service: Gastroenterology;;   CARDIOVERSION N/A 03/27/2020   Procedure: CARDIOVERSION;  Surgeon: RSkeet Latch MD;  [place] [facility]   Service: Cardiovascular;  Laterality: N/A;   CATARACT EXTRACTION Bilateral    ESOPHAGOGASTRODUODENOSCOPY N/A 08/19/2015   Procedure: ESOPHAGOGASTRODUODENOSCOPY (EGD);  Surgeon: JLaurence Spates MD;  [facility];  Service: Endoscopy;  Laterality: N/A;   ESOPHAGOGASTRODUODENOSCOPY N/A 10/05/2021   Procedure: ESOPHAGOGASTRODUODENOSCOPY (EGD);  Surgeon: KRonnette Juniper MD;  [facility];  Service: Gastroenterology;  Laterality: N/A;   INTRAOPERATIVE TRANSTHORACIC ECHOCARDIOGRAM N/A 07/26/2022   Procedure: INTRAOPERATIVE TRANSTHORACIC ECHOCARDIOGRAM;  Surgeon: MBurnell Blanks MD;  [facility]  Service: Open Heart Surgery;  Laterality: N/A;   right knee replacement Right 1996   RIGHT/LEFT HEART CATH AND CORONARY ANGIOGRAPHY N/A 05/26/2022   Procedure: RIGHT/LEFT HEART CATH AND CORONARY ANGIOGRAPHY;  Surgeon: MBurnell Blanks MD;  [facility];  Service: Cardiovascular;  Laterality: N/A;   SHOULDER SURGERY     TEE WITHOUT CARDIOVERSION N/A 03/27/2020   Procedure: TRANSESOPHAGEAL ECHOCARDIOGRAM (TEE);  Surgeon: RSkeet Latch MD;  [place] [facility]  Service: Cardiovascular;  Laterality: N/A;   TONSILLECTOMY     TOTAL HIP ARTHROPLASTY Right 08/13/2019   Procedure: RIGHT TOTAL HIP ARTHROPLASTY ANTERIOR APPROACH;  Surgeon: BMcarthur Rossetti MD;  [facility]  Service: Orthopedics;  Laterality: Right;   TRANSCATHETER AORTIC VALVE REPLACEMENT, TRANSFEMORAL N/A 07/26/2022   Procedure: Transcatheter Aortic Valve Replacement, Transfemoral using a 26 MM Edwards SAPIEN 3 Ultra;  Surgeon: MBurnell Blanks MD;  [facility]  Service: Open Heart Surgery;  Laterality: N/A;  Transfemoral approach   UMBILICAL HERNIA REPAIR N/A 08/25/2022   Procedure: UMBILICAL HERNIA REPAIR WITH POSSIBLE MESH;  Surgeon: TAutumn MessingIII, MD;  [facility];  Service: General;  Laterality: N/A;    Current Outpatient Medications  Medication Sig Dispense  Refill   allopurinol (ZYLOPRIM) 100 MG tablet Take 2 tablets (200 mg total) by mouth daily.     amoxicillin (AMOXIL) 500 MG tablet Take 4 tablets (2,000 mg total) by mouth as directed. 1 hour prior to dental work including cleanings 12 tablet 12   apixaban (ELIQUIS) 5 MG TABS tablet Take 1 tablet (5 mg total) by mouth 2 (two) times daily. 180 tablet 2   atorvastatin (LIPITOR) 10 MG tablet Take 10 mg by mouth every morning.  10   cyanocobalamin (VITAMIN B12) 1000 MCG tablet Take 1,000 mcg by mouth daily.     diltiazem (CARDIZEM CD) 240 MG 24 hr capsule TAKE (1) CAPSULE DAILY. 30 capsule 9   dutasteride (AVODART) 0.5 MG capsule Take 0.5 mg by mouth every morning.  2   furosemide (LASIX) 40 MG tablet Take 60 mg by mouth  4 (four) times a week.     influenza vaccine adjuvanted (FLUAD QUADRIVALENT) 0.5 ML injection Inject into the muscle. 0.5 mL 0   metoprolol tartrate (LOPRESSOR) 25 MG tablet Take 1 tablet (25 mg total) by mouth daily. 90 tablet 3   mirtazapine (REMERON) 30 MG tablet TAKE ONE TABLET AT BEDTIME. 90 tablet 0   Multiple Vitamin (MULTIVITAMIN) tablet Take 1 tablet by mouth daily.     Omega-3 Fatty Acids (FISH OIL) 1200 MG CAPS Take 1,200 mg by mouth daily.     oxyCODONE (OXY IR/ROXICODONE) 5 MG immediate release tablet Take 0.5-1 tablets (2.5-5 mg total) by mouth every 6 (six) hours as needed for moderate pain or severe pain (2.75m moderate, 511msevere). 10 tablet 0   pantoprazole (PROTONIX) 40 MG tablet Take 1 tablet (40 mg total) by mouth daily.     Polyethyl Glycol-Propyl Glycol (SYSTANE) 0.4-0.3 % SOLN Place 1 drop into both eyes 2 (two) times daily.     potassium chloride SA (KLOR-CON M) 20 MEQ tablet TAKE 1 TABLET BY MOUTH IN THE MORNING, TAKE 1/2 TABLET BY MOUTH IN THE P.M. 135 tablet 3   Probiotic Product (PROBIOTIC DAILY PO) Take 1 tablet by mouth daily.     pyridOXINE (VITAMIN B-6) 100 MG tablet Take 100 mg by mouth daily.     Respiratory Therapy Supplies (FLUTTER) DEVI Use the  flutter valve 10 times when doing it daily. 1 each 0   spironolactone (ALDACTONE) 25 MG tablet Take 0.5 tablets (12.5 mg total) by mouth daily. 90 tablet 2   tamsulosin (FLOMAX) 0.4 MG CAPS capsule Take 0.4 mg by mouth daily after breakfast.  2   Turmeric 500 MG CAPS Take 500 mg by mouth daily.     amiodarone (PACERONE) 200 MG tablet Take 1 tablet (200 mg total) by mouth daily. 90 tablet 1   No current facility-administered medications for this encounter.    No Known Allergies  Social History   Socioeconomic History   Marital status: Married    Spouse name: Not on file   Number of children: 2   Years of education: Not on file   Highest education level: Not on file  Occupational History   Occupation: Retired chSocial research officer, governmentTobacco Use   Smoking status: Former    Packs/day: 0.50    Types: Cigarettes    Start date: 1960    Quit date: 1961    Years since quitting: 62.8   Smokeless tobacco: Never   Tobacco comments:    Pt smoked for 67m49monthhen in armAdministrator, artsVaping Use: Never used  Substance and Sexual Activity   Alcohol use: Yes    Alcohol/week: 14.0 - 21.0 standard drinks of alcohol    Types: 14 - 21 Glasses of wine per week    Comment: 1-2 glasses of wine nightly 09/08/22   Drug use: Never   Sexual activity: Yes  Other Topics Concern   Not on file  Social History Narrative   Patient is right handed.   Patient drinks 1 cup of coffee per day.   Social Determinants of Health   Financial Resource Strain: Not on file  Food Insecurity: No Food Insecurity (08/23/2022)   Hunger Vital Sign    Worried About Running Out of Food in the Last Year: Never true    Ran Out of Food in the Last Year: Never true  Transportation Needs: No Transportation Needs (08/23/2022)   PRAPARE - Transportation  Lack of Transportation (Medical): No    Lack of Transportation (Non-Medical): No  Physical Activity: Not on file  Stress: Not on file  Social Connections: Not on file   Intimate Partner Violence: Not At Risk (08/24/2022)   Humiliation, Afraid, Rape, and Kick questionnaire    Fear of Current or Ex-Partner: No    Emotionally Abused: No    Physically Abused: No    Sexually Abused: No    Family History  Problem Relation Age of Onset   Pneumonia Mother 100   Cancer - Lung Father 45   Multiple myeloma Brother 37    ROS- All systems are reviewed and negative except as per the HPI above  Physical Exam: Vitals:   09/08/22 1332  BP: 122/68  Pulse: 69  Weight: 95.2 kg  Height: 5' 9"  (1.753 m)    Wt Readings from Last 3 Encounters:  09/08/22 95.2 kg  08/25/22 96 kg  08/05/22 96.2 kg    Labs: Lab Results  Component Value Date   NA 138 08/24/2022   K 4.2 08/24/2022   CL 108 08/24/2022   CO2 23 08/24/2022   GLUCOSE 94 08/24/2022   BUN 25 (H) 08/24/2022   CREATININE 0.75 08/24/2022   CALCIUM 8.3 (L) 08/24/2022   MG 1.7 07/27/2022   Lab Results  Component Value Date   INR 1.1 07/22/2022   Lab Results  Component Value Date   CHOL 96 03/06/2020   HDL 48 03/06/2020   LDLCALC 38 03/06/2020   TRIG 50 03/06/2020     GEN- The patient is a well appearing elderly male, alert and oriented x 3 today.   HEENT-head normocephalic, atraumatic, sclera clear, conjunctiva pink, hearing intact, trachea midline. Lungs- Clear to ausculation bilaterally, normal work of breathing Heart- Regular rate and rhythm, occasional ectopic beat, no murmurs, rubs or gallops  GI- soft, NT, ND, + BS Extremities- no clubbing, cyanosis, or edema MS- no significant deformity or atrophy Skin- no rash or lesion Psych- euthymic mood, full affect Neuro- strength and sensation are intact   EKG-   SR, 1st degree AV block, PVC Vent. rate 69 BPM PR interval 272 ms QRS duration 90 ms QT/QTcB 414/443 ms   Epic records reviewed    Assessment and Plan:  1. Persistent Afib Patient now off dofetilide, loaded on amiodarone.  He appears to be maintaining SR.  Continue  amiodarone 200 mg daily Check baseline TSH today.  Contine Eliquis 5 mg BID Continue Lopressor 25 mg daily  2. CHA2DS2VASc  score of at least 5 Continue  apixaban 5 mg bid   3. Aortic stenosis  S/p TAVR 07/26/22 Followed by Dr. Angelena Form    4. HTN Stable, no changes today.  5. CAD No anginal symptoms.   Follow up with the structural heart team as scheduled. AF clinic in 3 months.    [facility] [address] [place], [place] [address] [phone]

## 2022-09-09 ENCOUNTER — Ambulatory Visit: Payer: Medicare Other

## 2022-09-10 ENCOUNTER — Other Ambulatory Visit: Payer: Self-pay | Admitting: Internal Medicine

## 2022-09-13 ENCOUNTER — Other Ambulatory Visit (HOSPITAL_COMMUNITY): Payer: Self-pay | Admitting: *Deleted

## 2022-09-13 ENCOUNTER — Other Ambulatory Visit (HOSPITAL_COMMUNITY): Payer: Medicare Other

## 2022-09-13 DIAGNOSIS — K429 Umbilical hernia without obstruction or gangrene: Secondary | ICD-10-CM | POA: Diagnosis not present

## 2022-09-13 MED ORDER — AMIODARONE HCL 200 MG PO TABS: 200.0000 mg | ORAL_TABLET | Freq: Every day | ORAL | 1 refills | Status: DC

## 2022-09-23 DIAGNOSIS — N401 Enlarged prostate with lower urinary tract symptoms: Secondary | ICD-10-CM | POA: Diagnosis not present

## 2022-09-23 DIAGNOSIS — R351 Nocturia: Secondary | ICD-10-CM | POA: Diagnosis not present

## 2022-09-26 ENCOUNTER — Other Ambulatory Visit (HOSPITAL_COMMUNITY): Payer: Medicare Other

## 2022-09-26 ENCOUNTER — Ambulatory Visit: Payer: Medicare Other

## 2022-09-27 NOTE — Progress Notes (Unsigned)
HEART AND [place]                                     Cardiology Office Note:    Date:  09/29/2022   ID:  Patrick Franco, DOB 1932/05/15, MRN [id number]  PCP:  Lavone Orn, MD  [facility] Cardiologist:  [name], MD  [facility] Electrophysiologist:   Grayer, MD   Referring MD: Lavone Orn, MD   1 month s/p TAVR  History of Present Illness:    Patrick Franco is a 88 y.o. male with a hx of CAD, HTN, mitral valve prolapse, chronic diastolic CHF, PAF on Eliquis, sleep apnea and severe aortic stenosis s/p TAVR (07/26/22) who presents to clinic for follow up.   He had an echocardiogram 09/2016 which showed an LVEF at 62-95%, grade 1 diastolic dysfunction, functionally bicuspid aortic valve with moderate stenosis (mean gradient 21 mmHg, AVA 1.4cm2) with mild mitral regurgitation. He was referred to Dr. Angelena Form to follow his AS. Repeat echos over the years showed mild progression with most recent from 04/2022 showing a normal LVEF at 60-65%, and worsening AS with a mean gradient at 38.34mHg, peak 62.361mg, AVA by VTI at 1.0cm2, and DI at 0.26. At that time he was having progression of his symptoms as well. He underwent R/[facility]/13/23 which showed a heavily calcified LAD lesion with moderate proximal and mid disease. There appears to be a completely occluded branch just beyond this stenosis and then the LAD continues to the apex. Circumflex has a moderate mid stenosis. RCA is a large dominant vessel with heavy calcification throughout. Mild plaque in the proximal and mid vessel. Recommendations were to continue TAVR workup and treat his CAD medically. After his TAVR, if he continues to have dyspnea on exertion, can then consider complex PCI of the mid to distal LAD with orbital atherectomy and stenting.    He was evaluated by the multidisciplinary valve team and underwent successful TAVR with a 26 mm Edwards Sapien 3 THV via the TF  approach on 07/26/22. Post operative echo showed EF 65%, normally functioning TAVR with a mean gradient of 5.5 mm hg and no PVL. He was restarted on Eliquis.  He presented to the ED 08/23/22 with abdominal pain and was found to have a small bowel obstruction secondary to an umbilical hernia. He underwent an umbilical hernia repair 1028/41/32His dofetilide was held during admission as he was NPO. Decision was made with EP team to discontinue dofetilide and start amiodarone. Later got covid. He was seen back in the afib clinic and amiodarone was continued.   Today the patient presents to clinic for follow up. Here with wife. Doing well. Still having a slow recovery from everything he has been through. Overall, his breathing is a lot better. Still has some mucus and cough from Covid. No CP. No LE edema, orthopnea or PND. No dizziness or syncope. No blood in stool or urine. No palpitations.     Past Medical History:  Diagnosis Date   Aortic stenosis    Arthritis    Atrial fibrillation (HCC)    BPH (benign prostatic hyperplasia)    Chronic low back pain    Depression    Diverticulitis    Dyspnea    Elevated PSA    Foot drop, right    Gait disorder 02/03/2015   Hip pain  History of colonoscopy    History of nuclear stress test    Myoview 10/22: EF 67, no ischemia or infarction; low risk   HOH (hard of hearing)    hearing aid   Hypertension    Lumbar radiculopathy    Mitral valve prolapse    Onychomycosis    Peptic ulcer disease    S/P TAVR (transcatheter aortic valve replacement) 07/26/2022   5m S3UR via TF approach with Dr. MAngelena Formand Dr. BRandolm Idol  Sepsis ([facility] 2005   e. coli-after prostate bx   Sleep apnea    Torn ACL    Tracheobronchomalacia    seen on 2019 Chest CT    Past Surgical History:  Procedure Laterality Date   BIOPSY  10/05/2021   Procedure: BIOPSY;  Surgeon: KRonnette Juniper MD;  [facility];  Service: Gastroenterology;;   CARDIOVERSION N/A  03/27/2020   Procedure: CARDIOVERSION;  Surgeon: RSkeet Latch MD;  [place]  Service: Cardiovascular;  Laterality: N/A;   CATARACT EXTRACTION Bilateral    ESOPHAGOGASTRODUODENOSCOPY N/A 08/19/2015   Procedure: ESOPHAGOGASTRODUODENOSCOPY (EGD);  Surgeon: JLaurence Spates MD;  [facility];  Service: Endoscopy;  Laterality: N/A;   ESOPHAGOGASTRODUODENOSCOPY N/A 10/05/2021   Procedure: ESOPHAGOGASTRODUODENOSCOPY (EGD);  Surgeon: KRonnette Juniper MD;  [facility];  Service: Gastroenterology;  Laterality: N/A;   INTRAOPERATIVE TRANSTHORACIC ECHOCARDIOGRAM N/A 07/26/2022   Procedure: INTRAOPERATIVE TRANSTHORACIC ECHOCARDIOGRAM;  Surgeon: MBurnell Blanks MD;  [place]  Service: Open Heart Surgery;  Laterality: N/A;   right knee replacement Right 1996   RIGHT/LEFT HEART CATH AND CORONARY ANGIOGRAPHY N/A 05/26/2022   Procedure: RIGHT/LEFT HEART CATH AND CORONARY ANGIOGRAPHY;  Surgeon: MBurnell Blanks MD;  [place] [facility];  Service: Cardiovascular;  Laterality: N/A;   SHOULDER SURGERY     TEE WITHOUT CARDIOVERSION N/A 03/27/2020   Procedure: TRANSESOPHAGEAL ECHOCARDIOGRAM (TEE);  Surgeon: RSkeet Latch MD;  [place]  Service: Cardiovascular;  Laterality: N/A;   TONSILLECTOMY     TOTAL HIP ARTHROPLASTY Right 08/13/2019   Procedure: RIGHT TOTAL HIP ARTHROPLASTY ANTERIOR APPROACH;  Surgeon: BMcarthur Rossetti MD;  [place]  Service: Orthopedics;  Laterality: Right;   TRANSCATHETER AORTIC VALVE REPLACEMENT, TRANSFEMORAL N/A 07/26/2022   Procedure: Transcatheter Aortic Valve Replacement, Transfemoral using a 26 MM Edwards SAPIEN 3 Ultra;  Surgeon: MBurnell Blanks MD;  [place]  Service: Open Heart Surgery;  Laterality: N/A;  Transfemoral approach   UMBILICAL HERNIA REPAIR N/A 08/25/2022   Procedure: UMBILICAL HERNIA REPAIR WITH POSSIBLE MESH;  Surgeon: TAutumn MessingIII, MD;  [facility];   Service: General;  Laterality: N/A;    Current Medications: Current Meds  Medication Sig   allopurinol (ZYLOPRIM) 100 MG tablet Take 2 tablets (200 mg total) by mouth daily.   amiodarone (PACERONE) 200 MG tablet Take 1 tablet (200 mg total) by mouth daily.   amoxicillin (AMOXIL) 500 MG tablet Take 4 tablets (2,000 mg total) by mouth as directed. 1 hour prior to dental work including cleanings   apixaban (ELIQUIS) 5 MG TABS tablet Take 1 tablet (5 mg total) by mouth 2 (two) times daily.   atorvastatin (LIPITOR) 10 MG tablet Take 10 mg by mouth every morning.   cyanocobalamin (VITAMIN B12) 1000 MCG tablet Take 1,000 mcg by mouth daily.   diltiazem (CARDIZEM CD) 240 MG 24 hr capsule TAKE (1) CAPSULE DAILY.   dutasteride (AVODART) 0.5 MG capsule Take 0.5 mg by mouth every morning.   furosemide (LASIX) 40 MG  tablet Take 60 mg by mouth 4 (four) times a week.   influenza vaccine adjuvanted (FLUAD QUADRIVALENT) 0.5 ML injection Inject into the muscle.   metoprolol tartrate (LOPRESSOR) 25 MG tablet Take 1 tablet (25 mg total) by mouth daily.   mirtazapine (REMERON) 30 MG tablet TAKE ONE TABLET AT BEDTIME.   Multiple Vitamin (MULTIVITAMIN) tablet Take 1 tablet by mouth daily.   Omega-3 Fatty Acids (FISH OIL) 1200 MG CAPS Take 1,200 mg by mouth daily.   pantoprazole (PROTONIX) 40 MG tablet Take 1 tablet (40 mg total) by mouth daily.   Polyethyl Glycol-Propyl Glycol (SYSTANE) 0.4-0.3 % SOLN Place 1 drop into both eyes 2 (two) times daily.   potassium chloride SA (KLOR-CON M) 20 MEQ tablet TAKE 1 TABLET BY MOUTH IN THE MORNING, TAKE 1/2 TABLET BY MOUTH IN THE P.M.   Probiotic Product (PROBIOTIC DAILY PO) Take 1 tablet by mouth daily.   pyridOXINE (VITAMIN B-6) 100 MG tablet Take 100 mg by mouth daily.   spironolactone (ALDACTONE) 25 MG tablet Take 1 tablet (25 mg total) by mouth daily.   tamsulosin (FLOMAX) 0.4 MG CAPS capsule Take 0.4 mg by mouth daily after breakfast.   Turmeric 500 MG CAPS Take 500  mg by mouth daily.     Allergies:   Patient has no known allergies.   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   Patient drinks 1 cup of coffee per day.   Social Determinants of Health   Financial Resource Strain: Not on file  Food Insecurity: No Food Insecurity (08/23/2022)   Hunger Vital Sign    Worried About Running Out of Food in the Last Year: Never true    Ran Out of Food in the Last Year: Never true  Transportation Needs: No Transportation Needs (08/23/2022)   PRAPARE - THydrologist(Medical): No    Lack of Transportation (Non-Medical): No  Physical Activity: Not on file  Stress: Not on file  Social Connections: Not on file     Family History: The patient's family history includes Cancer - Lung (age of onset: 558 in his father; Multiple myeloma (age of onset: 532 in his brother; Pneumonia (age of onset: 854 in his mother.  ROS:   Please see the history of present illness.    All other systems reviewed and are negative.  EKGs/Labs/Other Studies Reviewed:    The following studies were reviewed today:  TAVR OPERATIVE  NOTE      Date of Procedure:                07/26/2022   Preoperative Diagnosis:      Severe Aortic Stenosis    Postoperative Diagnosis:    Same    Procedure:        Transcatheter Aortic Valve Replacement - Percutaneous Left Transfemoral Approach             Edwards Sapien 3 Ultra THV (size 26 mm, model # 9755RSL, serial # [id number])              Co-Surgeons:                        Coralie Common MD and [name] /     Anesthesiologist:                  Albertha Ghee MD   Echocardiographer:              Eleonore Chiquito   Pre-operative Echo Findings: Severe aortic stenosis Normal left ventricular systolic function   Post-operative Echo Findings: no paravalvular leak Normal left ventricular systolic function  ____________    Echo 07/27/22: IMPRESSIONS   1. 26 mm S3. Vmax 1.6 m/s, MG 5.5 mmHG, EOA 2.45, DI 0.57. No  regurgitation or paravalvular leak. Normal prosthesis. The aortic valve  has been repaired/replaced. Aortic valve regurgitation is not visualized.  There is a 26 mm Edwards Ultra, stented  (TAVR) valve present in the aortic position. Procedure Date: 07/26/22.   2. Left ventricular ejection fraction, by estimation, is 65 to 70%. The  left ventricle has normal function. The left ventricle has no regional  wall motion abnormalities. There is mild concentric left ventricular  hypertrophy. Left ventricular diastolic  function could not be evaluated.   3. Right ventricular systolic function is normal. The right ventricular  size is normal. There is mildly elevated pulmonary artery systolic  pressure. The estimated right ventricular systolic pressure is 09.9 mmHg.   4. Left atrial size was severely dilated.   5. The mitral valve is degenerative. Mild mitral valve regurgitation.  Mild to moderate mitral stenosis. Severe mitral annular calcification.   6. The inferior vena cava is normal in size with greater than 50%  respiratory variability, suggesting right atrial pressure  of 3 mmHg.   __________________________  Echo 09/28/22 IMPRESSIONS   1. Left ventricular ejection fraction, by estimation, is 65 to 70%. The  left ventricle has normal function. The left ventricle has no regional  wall motion abnormalities. There is moderate asymmetric left ventricular  hypertrophy of the basal-septal  segment. Left ventricular diastolic parameters are indeterminate.   2. Right ventricular systolic function is normal. The right ventricular  size is normal. Tricuspid regurgitation signal is inadequate for assessing  PA pressure.   3. Left atrial size was moderately dilated.   4. Right atrial size was mildly dilated.   5. The mitral valve is degenerative. Bileaflet prolapse. Mild to moderate  mitral valve regurgitation. Severe mitral annular calcification.   6. The aortic valve has been repaired/replaced. Aortic valve  regurgitation is mild. There is a 26 mm Edwards Sapien prosthetic (TAVR)  valve present in the aortic position. Mild PVL. MG 24mHg, Vmax 2.6 m/s,  EOA 1.9 cm^2, DI 0.6   7. The inferior vena cava is normal in size with greater than 50%  respiratory variability, suggesting right atrial pressure of 3 mmHg.  EKG:  EKG is NOT ordered today.    Recent Labs: 07/27/2022: Magnesium 1.7 08/23/2022: ALT 22 08/24/2022: BUN 25; Creatinine, Ser 0.75; Hemoglobin 12.1; Platelets 144; Potassium 4.2; Sodium 138 09/08/2022: TSH 2.257  Recent Lipid Panel    Component Value Date/Time   CHOL 96 03/06/2020 0330   TRIG 50 03/06/2020 0330   HDL 48 03/06/2020 0330   CHOLHDL 2.0 03/06/2020 0330   VLDL 10 03/06/2020 0330   LDLCALC 38 03/06/2020 0330     Risk Assessment/Calculations:    CHA2DS2-VASc Score = 5   This indicates a 7.2% annual risk of stroke. The patient's score is based upon: CHF History: 1 HTN History: 1 Diabetes History: 0 Stroke History: 0 Vascular Disease History: 1 Age Score: 2 Gender Score: 0      Physical Exam:    VS:  BP (!)  120/58   Pulse 67   Ht _0  (1.778 m)   Wt 210 lb 3.2 oz (95.3 kg)   SpO2 96%   BMI 30.16 kg/m     Wt Readings from Last 3 Encounters:  09/28/22 210 lb 3.2 oz (95.3 kg)  09/08/22 209 lb 13.9 oz (95.2 kg)  08/25/22 211 lb 10.3 oz (96 kg)     GEN:  Well nourished, well developed in no acute distress HEENT: Normal NECK: No JVD LYMPHATICS: No lymphadenopathy CARDIAC: RRR, soft flow murmur @ RUSB and holosystolic murmur at apex. No rubs, gallops RESPIRATORY:  Clear to auscultation without rales, wheezing or rhonchi  ABDOMEN: Soft, non-tender, non-distended MUSCULOSKELETAL:  No edema; No deformity  SKIN: Warm and dry.  NEUROLOGIC:  Alert and oriented x 3 PSYCHIATRIC:  Normal affect   ASSESSMENT:    1. S/P TAVR (transcatheter aortic valve replacement)   2. Coronary artery disease involving native coronary artery of native heart without angina pectoris   3. PAF (paroxysmal atrial fibrillation) ([place])   4. Chronic heart failure with preserved ejection fraction (HCC)   5. Mitral valve prolapse     PLAN:    In order of problems listed above:  Severe AS s/p TAVR: echo today shows EF 65%, moderate asymmetric LVH, normally functioning TAVR with a mean gradient of 13 mm hg and mild PVL as well as MVP with mild-mod MR and severe MAC. He has NYHA class II symptom. SBE prophylaxis discussed; he has amoxicillin. Continue on Eliquis. I will see him back next year for follow up and echo.   CAD: he underwent [facility] 05/26/22 which showed a heavily calcified LAD lesion with moderate proximal and mid disease.There appears to be a completely occluded branch just beyond this stenosis and then the LAD continues to the apex. Circumflex has a moderate mid stenosis. RCA is a large dominant vessel with heavy calcification throughout. Mild plaque in the proximal and mid vessel. Recommendations were to continue TAVR workup and treat his CAD medically. After his TAVR, if he continues to have dyspnea on  exertion, can then consider complex PCI of the mid to distal LAD with orbital atherectomy and stenting. I think his breathing has improved quite a bit and there is no need to consider PCI at this time. No ASA given Eliquis.    PAF: continue amiodarone and Eliquis    HTN: BP well controlled. No changes made.    Chronic diastolic CHF: appears euvolemic on exam today.  Continue as need lasix (takes about 4x a week)    Mitral valve prolapse: mild-mod MR on echo today. Continue to follow with surveillence echocardiograms.  Cardiac Rehabilitation Eligibility Assessment  The patient is ready to start cardiac rehabilitation from a cardiac standpoint.         Medication Adjustments/Labs and Tests Ordered: Current medicines are reviewed at length with the patient today.  Concerns regarding medicines are outlined above.  No orders of the defined types were placed in this encounter.  No orders of the defined types were placed in this encounter.   Patient Instructions  Medication Instructions:  Your physician recommends that you continue on your current medications as directed. Please refer to the Current Medication list given to you today.  *If you need a refill on your cardiac medications before your next appointment, please call your pharmacy*   Lab Work: none If you have labs (blood work) drawn today and your tests are completely normal, you will receive your results only by: [place] (if you have MyChart) OR A paper copy in the mail If you have any lab test that is abnormal or we need to change your treatment, we will call you to review the results.   Testing/Procedures: Your physician has requested that you have an echocardiogram. Echocardiography is a painless test that uses sound waves to create images of your heart. It provides your doctor with information about the size and shape of your heart and how well your heart's chambers and valves are working. This procedure  takes approximately one hour. There are no restrictions for this procedure. Please do NOT wear cologne, perfume, aftershave, or lotions (deodorant is allowed). Please arrive 15 minutes prior to your appointment time. Scheduled for July 21, 2023   Follow-Up: At [facility], you and your health needs are our priority.  As part of our continuing mission to provide you with exceptional heart care, we have created designated Provider Care Teams.  These Care Teams include your primary Cardiologist (physician) and Advanced Practice Providers (APPs -  Physician Assistants and Nurse Practitioners) who all work together to provide you with the care you need, when you need it.  We recommend signing up for the patient portal called "MyChart".  Sign up information is provided on this After Visit Summary.  MyChart is used to connect with patients for Virtual Visits (Telemedicine).  Patients are able to view lab/test results, encounter notes, upcoming appointments, etc.  Non-urgent messages can be sent to your provider as well.   To learn more about what you can do with MyChart, go to [email or web address].    Your next appointment:   January 20, 2023  The format for your next appointment:   In Person  Provider:   [name], MD     Also scheduled to see K. Grandville Silos, [place] on July 21, 2023   Important Information About Sugar         Signed, Angelena Form, PA-C  09/29/2022 9:10 AM    [facility]

## 2022-09-28 ENCOUNTER — Ambulatory Visit (HOSPITAL_COMMUNITY): Payer: Medicare Other | Attending: Cardiology

## 2022-09-28 ENCOUNTER — Ambulatory Visit (INDEPENDENT_AMBULATORY_CARE_PROVIDER_SITE_OTHER): Payer: Medicare Other | Admitting: Physician Assistant

## 2022-09-28 VITALS — BP 120/58 | HR 67 | Ht 70.0 in | Wt 210.2 lb

## 2022-09-28 DIAGNOSIS — I480 Paroxysmal atrial fibrillation: Secondary | ICD-10-CM | POA: Insufficient documentation

## 2022-09-28 DIAGNOSIS — Z952 Presence of prosthetic heart valve: Secondary | ICD-10-CM | POA: Insufficient documentation

## 2022-09-28 DIAGNOSIS — I2510 Atherosclerotic heart disease of native coronary artery without angina pectoris: Secondary | ICD-10-CM | POA: Diagnosis not present

## 2022-09-28 DIAGNOSIS — I341 Nonrheumatic mitral (valve) prolapse: Secondary | ICD-10-CM | POA: Insufficient documentation

## 2022-09-28 DIAGNOSIS — I5032 Chronic diastolic (congestive) heart failure: Secondary | ICD-10-CM | POA: Insufficient documentation

## 2022-09-28 DIAGNOSIS — I350 Nonrheumatic aortic (valve) stenosis: Secondary | ICD-10-CM | POA: Insufficient documentation

## 2022-09-28 LAB — ECHOCARDIOGRAM COMPLETE
AR max vel: 1.67 cm2
AV Area VTI: 2.06 cm2
AV Area mean vel: 1.65 cm2
AV Mean grad: 12.8 mmHg
AV Peak grad: 27.5 mmHg
Ao pk vel: 2.62 m/s
Area-P 1/2: 2.82 cm2
S' Lateral: 3.3 cm

## 2022-09-28 NOTE — Patient Instructions (Signed)
Medication Instructions:  Your physician recommends that you continue on your current medications as directed. Please refer to the Current Medication list given to you today.  *If you need a refill on your cardiac medications before your next appointment, please call your pharmacy*   Lab Work: none If you have labs (blood work) drawn today and your tests are completely normal, you will receive your results only by: [place] (if you have MyChart) OR A paper copy in the mail If you have any lab test that is abnormal or we need to change your treatment, we will call you to review the results.   Testing/Procedures: Your physician has requested that you have an echocardiogram. Echocardiography is a painless test that uses sound waves to create images of your heart. It provides your doctor with information about the size and shape of your heart and how well your heart's chambers and valves are working. This procedure takes approximately one hour. There are no restrictions for this procedure. Please do NOT wear cologne, perfume, aftershave, or lotions (deodorant is allowed). Please arrive 15 minutes prior to your appointment time. Scheduled for July 21, 2023   Follow-Up: At [facility], you and your health needs are our priority.  As part of our continuing mission to provide you with exceptional heart care, we have created designated Provider Care Teams.  These Care Teams include your primary Cardiologist (physician) and Advanced Practice Providers (APPs -  Physician Assistants and Nurse Practitioners) who all work together to provide you with the care you need, when you need it.  We recommend signing up for the patient portal called "MyChart".  Sign up information is provided on this After Visit Summary.  MyChart is used to connect with patients for Virtual Visits (Telemedicine).  Patients are able to view lab/test results, encounter notes, upcoming appointments, etc.   Non-urgent messages can be sent to your provider as well.   To learn more about what you can do with MyChart, go to [email or web address].    Your next appointment:   January 20, 2023  The format for your next appointment:   In Person  Provider:   [name], MD     Also scheduled to see K. Grandville Silos, [place] on July 21, 2023   Important Information About Sugar

## 2022-09-30 ENCOUNTER — Telehealth (HOSPITAL_COMMUNITY): Payer: Self-pay

## 2022-09-30 ENCOUNTER — Encounter (HOSPITAL_COMMUNITY): Payer: Self-pay

## 2022-09-30 NOTE — Telephone Encounter (Signed)
Pt returned CR phone call and stated he is interested. Patient will come in for orientation on 10/11/22 @ 8AM and will attend the 8:15AM exercise class. Went over insurance, patient verbalized understanding.    Tourist information centre manager.

## 2022-09-30 NOTE — Telephone Encounter (Signed)
Attempted to call patient in regards to Cardiac Rehab - LM on VM Mailed letter 

## 2022-10-05 ENCOUNTER — Telehealth (HOSPITAL_COMMUNITY): Payer: Self-pay | Admitting: *Deleted

## 2022-10-05 NOTE — Telephone Encounter (Signed)
Confirmed appointment. Completed health history.Barnet Pall, RN,BSN 10/05/2022 8:30 AM

## 2022-10-10 DIAGNOSIS — D2261 Melanocytic nevi of right upper limb, including shoulder: Secondary | ICD-10-CM | POA: Diagnosis not present

## 2022-10-10 DIAGNOSIS — L821 Other seborrheic keratosis: Secondary | ICD-10-CM | POA: Diagnosis not present

## 2022-10-10 DIAGNOSIS — L570 Actinic keratosis: Secondary | ICD-10-CM | POA: Diagnosis not present

## 2022-10-10 DIAGNOSIS — D225 Melanocytic nevi of trunk: Secondary | ICD-10-CM | POA: Diagnosis not present

## 2022-10-10 DIAGNOSIS — D485 Neoplasm of uncertain behavior of skin: Secondary | ICD-10-CM | POA: Diagnosis not present

## 2022-10-10 DIAGNOSIS — D2271 Melanocytic nevi of right lower limb, including hip: Secondary | ICD-10-CM | POA: Diagnosis not present

## 2022-10-10 DIAGNOSIS — C44319 Basal cell carcinoma of skin of other parts of face: Secondary | ICD-10-CM | POA: Diagnosis not present

## 2022-10-10 DIAGNOSIS — D2262 Melanocytic nevi of left upper limb, including shoulder: Secondary | ICD-10-CM | POA: Diagnosis not present

## 2022-10-10 DIAGNOSIS — Z85828 Personal history of other malignant neoplasm of skin: Secondary | ICD-10-CM | POA: Diagnosis not present

## 2022-10-10 DIAGNOSIS — D2272 Melanocytic nevi of left lower limb, including hip: Secondary | ICD-10-CM | POA: Diagnosis not present

## 2022-10-10 DIAGNOSIS — D1801 Hemangioma of skin and subcutaneous tissue: Secondary | ICD-10-CM | POA: Diagnosis not present

## 2022-10-11 ENCOUNTER — Encounter (HOSPITAL_COMMUNITY)
Admission: RE | Admit: 2022-10-11 | Discharge: 2022-10-11 | Disposition: A | Discharge status: Home / Self Care | Payer: Medicare Other | Source: Ambulatory Visit | Attending: Cardiovascular Disease | Admitting: Cardiovascular Disease

## 2022-10-11 ENCOUNTER — Encounter (HOSPITAL_COMMUNITY): Payer: Self-pay

## 2022-10-11 VITALS — BP 108/74 | HR 63 | Ht 68.75 in | Wt 211.0 lb

## 2022-10-11 DIAGNOSIS — Z952 Presence of prosthetic heart valve: Secondary | ICD-10-CM | POA: Insufficient documentation

## 2022-10-11 NOTE — Progress Notes (Signed)
Cardiac Rehab Medication Review by a Nurse  Does the patient  feel that his/her medications are working for him/her?  yes  Has the patient been experiencing any side effects to the medications prescribed?  no  Does the patient measure his/her own blood pressure or blood glucose at home?  yes   Does the patient have any problems obtaining medications due to transportation or finances?   no  Understanding of regimen: good Understanding of indications: good Potential of compliance: excellent    Nurse comments: Ira is taking his medications as prescribed and has a good understanding of what his medications are for. Josep checks his blood pressure periodically.    Christa See [facility] RN 10/11/2022 8:53 AM

## 2022-10-11 NOTE — Progress Notes (Signed)
Cardiac Individual Treatment Plan  Patient Details  Name: Patrick Franco MRN: [id number] Date of Birth: [birth year] Referring Provider:   Flowsheet Row INTENSIVE CARDIAC REHAB ORIENT from 10/11/2022 in [facility] for Heart, Vascular, & [place]  Referring Provider [name], MD       Initial Encounter Date:  [place] from 10/11/2022 in [facility] for Heart, Vascular, & Lung [facility]  Date 10/11/22       Visit Diagnosis: 07/26/22 TAVR  Patient's Home Medications on Admission:  Current Outpatient Medications:    acetaminophen (TYLENOL) 500 MG tablet, Take 1,000 mg by mouth every 6 (six) hours as needed for moderate pain., Disp: , Rfl:    allopurinol (ZYLOPRIM) 100 MG tablet, Take 2 tablets (200 mg total) by mouth daily., Disp: , Rfl:    amiodarone (PACERONE) 200 MG tablet, Take 1 tablet (200 mg total) by mouth daily., Disp: 90 tablet, Rfl: 1   amoxicillin (AMOXIL) 500 MG tablet, Take 4 tablets (2,000 mg total) by mouth as directed. 1 hour prior to dental work including cleanings, Disp: 12 tablet, Rfl: 12   apixaban (ELIQUIS) 5 MG TABS tablet, Take 1 tablet (5 mg total) by mouth 2 (two) times daily., Disp: 180 tablet, Rfl: 2   atorvastatin (LIPITOR) 10 MG tablet, Take 10 mg by mouth every morning., Disp: , Rfl: 10   cyanocobalamin (VITAMIN B12) 1000 MCG tablet, Take 1,000 mcg by mouth daily., Disp: , Rfl:    diltiazem (CARDIZEM CD) 240 MG 24 hr capsule, TAKE (1) CAPSULE DAILY., Disp: 30 capsule, Rfl: 9   dutasteride (AVODART) 0.5 MG capsule, Take 0.5 mg by mouth every morning., Disp: , Rfl: 2   furosemide (LASIX) 40 MG tablet, Take 60 mg by mouth 4 (four) times a week., Disp: , Rfl:    metoprolol tartrate (LOPRESSOR) 25 MG tablet, Take 1 tablet (25 mg total) by mouth daily., Disp: 90 tablet, Rfl: 3   mirtazapine (REMERON) 30 MG tablet, TAKE ONE TABLET AT BEDTIME., Disp: 90 tablet, Rfl: 0    Multiple Vitamin (MULTIVITAMIN) tablet, Take 1 tablet by mouth daily., Disp: , Rfl:    Omega-3 Fatty Acids (FISH OIL) 1200 MG CAPS, Take 1,200 mg by mouth daily., Disp: , Rfl:    pantoprazole (PROTONIX) 40 MG tablet, Take 1 tablet (40 mg total) by mouth daily., Disp: , Rfl:    Polyethyl Glycol-Propyl Glycol (SYSTANE) 0.4-0.3 % SOLN, Place 1 drop into both eyes 2 (two) times daily., Disp: , Rfl:    potassium chloride SA (KLOR-CON M) 20 MEQ tablet, TAKE 1 TABLET BY MOUTH IN THE MORNING, TAKE 1/2 TABLET BY MOUTH IN THE P.M., Disp: 135 tablet, Rfl: 3   Probiotic Product (PROBIOTIC DAILY PO), Take 1 tablet by mouth daily., Disp: , Rfl:    pyridOXINE (VITAMIN B-6) 100 MG tablet, Take 100 mg by mouth daily., Disp: , Rfl:    spironolactone (ALDACTONE) 25 MG tablet, Take 1 tablet (25 mg total) by mouth daily., Disp: 90 tablet, Rfl: 2   tamsulosin (FLOMAX) 0.4 MG CAPS capsule, Take 0.4 mg by mouth daily after breakfast., Disp: , Rfl: 2   Turmeric 500 MG CAPS, Take 500 mg by mouth daily., Disp: , Rfl:   Past Medical History: Past Medical History:  Diagnosis Date   Aortic stenosis    Arthritis    Atrial fibrillation (HCC)    BPH (benign prostatic hyperplasia)    Chronic low back pain  Depression    Diverticulitis    Dyspnea    Elevated PSA    Foot drop, right    Gait disorder 02/03/2015   Hip pain    History of colonoscopy    History of nuclear stress test    Myoview 10/22: EF 67, no ischemia or infarction; low risk   HOH (hard of hearing)    hearing aid   Hypertension    Lumbar radiculopathy    Mitral valve prolapse    Onychomycosis    Peptic ulcer disease    S/P TAVR (transcatheter aortic valve replacement) 07/26/2022   46m S3UR via TF approach with Dr. MAngelena Formand Dr. BRandolm Idol  Sepsis ([facility] 2005   e. coli-after prostate bx   Sleep apnea    Torn ACL    Tracheobronchomalacia    seen on 2019 Chest CT    Tobacco Use: Social History   Tobacco Use  Smoking Status Former    Packs/day: 0.50   Types: Cigarettes   Start date: 183  Quit date: 1961   Years since quitting: 62.9  Smokeless Tobacco Never  Tobacco Comments   Pt smoked for 614monthwhen in [employer]    Labs: Review Flowsheet       Latest Ref Rng & Units 03/06/2020 05/26/2022 07/26/2022  Labs for ITP Cardiac and Pulmonary Rehab  Cholestrol 0 - 200 mg/dL 96  - -  LDL (calc) 0 - 99 mg/dL 38  - -  HDL-C >40 mg/dL 48  - -  Trlycerides <150 mg/dL 50  - -  PH, Arterial 7.35 - 7.45 - 7.432  -  PCO2 arterial 32 - 48 mmHg - 34.5  -  Bicarbonate 20.0 - 28.0 mmol/L - 23.0  -  TCO2 22 - 32 mmol/L - '24  20  15  23  23   '$ Acid-base deficit 0.0 - 2.0 mmol/L - 1.0  -  O2 Saturation % - 97  -    Capillary Blood Glucose: Lab Results  Component Value Date   GLUCAP 172 (H) 07/27/2022   GLUCAP 94 07/26/2022     Exercise Target Goals: Exercise Program Goal: Individual exercise prescription set using results from initial 6 min walk test and THRR while considering  patient's activity barriers and safety.   Exercise Prescription Goal: Initial exercise prescription builds to 30-45 minutes a day of aerobic activity, 2-3 days per week.  Home exercise guidelines will be given to patient during program as part of exercise prescription that the participant will acknowledge.  Activity Barriers & Risk Stratification:  Activity Barriers & Cardiac Risk Stratification - 10/11/22 1001       Activity Barriers & Cardiac Risk Stratification   Activity Barriers Arthritis;Back Problems;Neck/Spine Problems;Joint Problems;Deconditioning;Muscular Weakness;Balance Concerns;History of Falls    Cardiac Risk Stratification High             6 Minute Walk:  6 Minute Walk     Row Name 10/11/22 1000         6 Minute Walk   Phase Initial     Distance 1005 feet  Used GOCART for balance concerns     Walk Time 6 minutes     # of Rest Breaks 0     MPH 1.9     METS 1.42     RPE 11     Perceived Dyspnea  0     VO2 Peak 5      Symptoms No     Resting HR 65 bpm  Resting BP 108/74     Resting Oxygen Saturation  96 %     Exercise Oxygen Saturation  during 6 min walk 93 %     Max Ex. HR 101 bpm     Max Ex. BP 152/72     2 Minute Post BP 110/70              Oxygen Initial Assessment:   Oxygen Re-Evaluation:   Oxygen Discharge (Final Oxygen Re-Evaluation):   Initial Exercise Prescription:  Initial Exercise Prescription - 10/11/22 1000       Date of Initial Exercise RX and Referring Provider   Date 10/11/22    Referring Provider [name], MD    Expected Discharge Date 12/09/22      NuStep   Level 1    SPM 75    Minutes 30    METs 1.5      Prescription Details   Frequency (times per week) 3    Duration Progress to 30 minutes of continuous aerobic without signs/symptoms of physical distress      Intensity   THRR 40-80% of Max Heartrate 53-106    Ratings of Perceived Exertion 11-13    Perceived Dyspnea 0-4      Progression   Progression Continue progressive overload as per policy without signs/symptoms or physical distress.      Resistance Training   Training Prescription Yes    Weight 3 lbs    Reps 10-15             Perform Capillary Blood Glucose checks as needed.  Exercise Prescription Changes:   Exercise Comments:   Exercise Goals and Review:   Exercise Goals     Row Name 10/11/22 1002             Exercise Goals   Increase Physical Activity Yes       Intervention Provide advice, education, support and counseling about physical activity/exercise needs.;Develop an individualized exercise prescription for aerobic and resistive training based on initial evaluation findings, risk stratification, comorbidities and participant's personal goals.       Expected Outcomes Short Term: Attend rehab on a regular basis to increase amount of physical activity.;Long Term: Add in home exercise to make exercise part of routine and to increase amount of physical  activity.;Long Term: Exercising regularly at least 3-5 days a week.       Increase Strength and Stamina Yes       Intervention Develop an individualized exercise prescription for aerobic and resistive training based on initial evaluation findings, risk stratification, comorbidities and participant's personal goals.;Provide advice, education, support and counseling about physical activity/exercise needs.       Expected Outcomes Short Term: Increase workloads from initial exercise prescription for resistance, speed, and METs.;Short Term: Perform resistance training exercises routinely during rehab and add in resistance training at home;Long Term: Improve cardiorespiratory fitness, muscular endurance and strength as measured by increased METs and functional capacity (6MWT)       Able to understand and use rate of perceived exertion (RPE) scale Yes       Intervention Provide education and explanation on how to use RPE scale       Expected Outcomes Short Term: Able to use RPE daily in rehab to express subjective intensity level;Long Term:  Able to use RPE to guide intensity level when exercising independently       Knowledge and understanding of Target Heart Rate Range (THRR) Yes       Intervention Provide  education and explanation of THRR including how the numbers were predicted and where they are located for reference       Expected Outcomes Short Term: Able to state/look up THRR;Short Term: Able to use daily as guideline for intensity in rehab;Long Term: Able to use THRR to govern intensity when exercising independently       Understanding of Exercise Prescription Yes       Intervention Provide education, explanation, and written materials on patient's individual exercise prescription       Expected Outcomes Short Term: Able to explain program exercise prescription;Long Term: Able to explain home exercise prescription to exercise independently                Exercise Goals Re-Evaluation  :   Discharge Exercise Prescription (Final Exercise Prescription Changes):   Nutrition:  Target Goals: Understanding of nutrition guidelines, daily intake of sodium '1500mg'$ , cholesterol '200mg'$ , calories 30% from fat and 7% or less from saturated fats, daily to have 5 or more servings of fruits and vegetables.  Biometrics:  Pre Biometrics - 10/11/22 0800       Pre Biometrics   Waist Circumference 44 inches    Hip Circumference 45 inches    Waist to Hip Ratio 0.98 %    Triceps Skinfold 17 mm    % Body Fat 31.9 %    Grip Strength 30 kg    Flexibility --   Not perfomed; significant back issues   Single Leg Stand 1 seconds              Nutrition Therapy Plan and Nutrition Goals:   Nutrition Assessments:  MEDIFICTS Score Key: ?70 Need to make dietary changes  40-70 Heart Healthy Diet ? 40 Therapeutic Level Cholesterol Diet    Picture Your Plate Scores: <78 Unhealthy dietary pattern with much room for improvement. 41-50 Dietary pattern unlikely to meet recommendations for good health and room for improvement. 51-60 More healthful dietary pattern, with some room for improvement.  >60 Healthy dietary pattern, although there may be some specific behaviors that could be improved.    Nutrition Goals Re-Evaluation:   Nutrition Goals Re-Evaluation:   Nutrition Goals Discharge (Final Nutrition Goals Re-Evaluation):   Psychosocial: Target Goals: Acknowledge presence or absence of significant depression and/or stress, maximize coping skills, provide positive support system. Participant is able to verbalize types and ability to use techniques and skills needed for reducing stress and depression.  Initial Review & Psychosocial Screening:  Initial Psych Review & Screening - 10/11/22 0851       Initial Review   Current issues with None Identified;History of Depression;Current Stress Concerns   [place] denies being depressed currently   Source of Stress Concerns Chronic  Illness;Unable to participate in former interests or hobbies;Unable to perform yard/household activities      [place]? Yes   [place] has his wife for support     Barriers   Psychosocial barriers to participate in program There are no identifiable barriers or psychosocial needs.      Screening Interventions   Interventions Encouraged to exercise;Provide feedback about the scores to participant    Expected Outcomes Long Term Goal: Stressors or current issues are controlled or eliminated.             Quality of Life Scores:  Quality of Life - 10/11/22 0956       Quality of Life   Select Quality of Life      Quality of  Life Scores   Health/Function Pre 19.83 %    Socioeconomic Pre 28.33 %    Psych/Spiritual Pre 20.93 %    Family Pre 27 %    GLOBAL Pre 22.7 %            Scores of 19 and below usually indicate a poorer quality of life in these areas.  A difference of  2-3 points is a clinically meaningful difference.  A difference of 2-3 points in the total score of the Quality of Life Index has been associated with significant improvement in overall quality of life, self-image, physical symptoms, and general health in studies assessing change in quality of life.  PHQ-9: Review Flowsheet       10/11/2022  Depression screen PHQ 2/9  Decreased Interest 0  Down, Depressed, Hopeless 0  PHQ - 2 Score 0   Interpretation of Total Score  Total Score Depression Severity:  1-4 = Minimal depression, 5-9 = Mild depression, 10-14 = Moderate depression, 15-19 = Moderately severe depression, 20-27 = Severe depression   Psychosocial Evaluation and Intervention:   Psychosocial Re-Evaluation:   Psychosocial Discharge (Final Psychosocial Re-Evaluation):   Vocational Rehabilitation: Provide vocational rehab assistance to qualifying candidates.   Vocational Rehab Evaluation & Intervention:  Vocational Rehab - 10/11/22 7989       Initial Vocational  Rehab Evaluation & Intervention   Assessment shows need for Vocational Rehabilitation No   Avrey is retired and does not need vocational rehab at this time            Education: Education Goals: Education classes will be provided on a weekly basis, covering required topics. Participant will state understanding/return demonstration of topics presented.     Core Videos: Exercise    Move It!  Clinical staff conducted group or individual video education with verbal and written material and guidebook.  Patient learns the recommended Pritikin exercise program. Exercise with the goal of living a long, healthy life. Some of the health benefits of exercise include controlled diabetes, healthier blood pressure levels, improved cholesterol levels, improved heart and lung capacity, improved sleep, and better body composition. Everyone should speak with their doctor before starting or changing an exercise routine.  Biomechanical Limitations Clinical staff conducted group or individual video education with verbal and written material and guidebook.  Patient learns how biomechanical limitations can impact exercise and how we can mitigate and possibly overcome limitations to have an impactful and balanced exercise routine.  Body Composition Clinical staff conducted group or individual video education with verbal and written material and guidebook.  Patient learns that body composition (ratio of muscle mass to fat mass) is a key component to assessing overall fitness, rather than body weight alone. Increased fat mass, especially visceral belly fat, can put [place] at increased risk for metabolic syndrome, type 2 diabetes, heart disease, and even death. It is recommended to combine diet and exercise (cardiovascular and resistance training) to improve your body composition. Seek guidance from your physician and exercise physiologist before implementing an exercise routine.  Exercise Action Plan Clinical staff  conducted group or individual video education with verbal and written material and guidebook.  Patient learns the recommended strategies to achieve and enjoy long-term exercise adherence, including variety, self-motivation, self-efficacy, and positive decision making. Benefits of exercise include fitness, good health, weight management, more energy, better sleep, less stress, and overall well-being.  Medical   Heart Disease Risk Reduction Clinical staff conducted group or individual video education with verbal and written material and  guidebook.  Patient learns our heart is our most vital organ as it circulates oxygen, nutrients, white blood cells, and hormones throughout the entire body, and carries waste away. Data supports a plant-based eating plan like the Pritikin Program for its effectiveness in slowing progression of and reversing heart disease. The video provides a number of recommendations to address heart disease.   Metabolic Syndrome and Belly Fat  Clinical staff conducted group or individual video education with verbal and written material and guidebook.  Patient learns what metabolic syndrome is, how it leads to heart disease, and how one can reverse it and keep it from coming back. You have metabolic syndrome if you have 3 of the following 5 criteria: abdominal obesity, high blood pressure, high triglycerides, low HDL cholesterol, and high blood sugar.  Hypertension and Heart Disease Clinical staff conducted group or individual video education with verbal and written material and guidebook.  Patient learns that high blood pressure, or hypertension, is very common in the [place]. Hypertension is largely due to excessive salt intake, but other important risk factors include being overweight, physical inactivity, drinking too much alcohol, smoking, and not eating enough potassium from fruits and vegetables. High blood pressure is a leading risk factor for heart attack, stroke,  congestive heart failure, dementia, kidney failure, and premature death. Long-term effects of excessive salt intake include stiffening of the arteries and thickening of heart muscle and organ damage. Recommendations include ways to reduce hypertension and the risk of heart disease.  Diseases of Our Time - Focusing on Diabetes Clinical staff conducted group or individual video education with verbal and written material and guidebook.  Patient learns why the best way to stop diseases of our time is prevention, through food and other lifestyle changes. Medicine (such as prescription pills and surgeries) is often only a Band-Aid on the problem, not a long-term solution. Most common diseases of our time include obesity, type 2 diabetes, hypertension, heart disease, and cancer. The Pritikin Program is recommended and has been proven to help reduce, reverse, and/or prevent the damaging effects of metabolic syndrome.  Nutrition   Overview of the Pritikin Eating Plan  Clinical staff conducted group or individual video education with verbal and written material and guidebook.  Patient learns about the [place] for disease risk reduction. The [place] emphasizes a wide variety of unrefined, minimally-processed carbohydrates, like fruits, vegetables, whole grains, and legumes. Go, Caution, and Stop food choices are explained. Plant-based and lean animal proteins are emphasized. Rationale provided for low sodium intake for blood pressure control, low added sugars for blood sugar stabilization, and low added fats and oils for coronary artery disease risk reduction and weight management.  Calorie Density  Clinical staff conducted group or individual video education with verbal and written material and guidebook.  Patient learns about calorie density and how it impacts the Pritikin Eating Plan. Knowing the characteristics of the food you choose will help you decide whether those foods will lead  to weight gain or weight loss, and whether you want to consume more or less of them. Weight loss is usually a side effect of the Pritikin Eating Plan because of its focus on low calorie-dense foods.  Label Reading  Clinical staff conducted group or individual video education with verbal and written material and guidebook.  Patient learns about the Pritikin recommended label reading guidelines and corresponding recommendations regarding calorie density, added sugars, sodium content, and whole grains.  Dining Out - Part 1  Clinical  staff conducted group or individual video education with verbal and written material and guidebook.  Patient learns that restaurant meals can be sabotaging because they can be so high in calories, fat, sodium, and/or sugar. Patient learns recommended strategies on how to positively address this and avoid unhealthy pitfalls.  Facts on Fats  Clinical staff conducted group or individual video education with verbal and written material and guidebook.  Patient learns that lifestyle modifications can be just as effective, if not more so, as many medications for lowering your risk of heart disease. A Pritikin lifestyle can help to reduce your risk of inflammation and atherosclerosis (cholesterol build-up, or plaque, in the artery walls). Lifestyle interventions such as dietary choices and physical activity address the cause of atherosclerosis. A review of the types of fats and their impact on blood cholesterol levels, along with dietary recommendations to reduce fat intake is also included.  Nutrition Action Plan  Clinical staff conducted group or individual video education with verbal and written material and guidebook.  Patient learns how to incorporate Pritikin recommendations into their lifestyle. Recommendations include planning and keeping personal health goals in mind as an important part of their success.  Healthy Mind-Set    Healthy Minds, Bodies, Hearts  Clinical  staff conducted group or individual video education with verbal and written material and guidebook.  Patient learns how to identify when they are stressed. Video will discuss the impact of that stress, as well as the many benefits of stress management. Patient will also be introduced to stress management techniques. The way we think, act, and feel has an impact on our hearts.  How Our Thoughts Can Heal Our Hearts  Clinical staff conducted group or individual video education with verbal and written material and guidebook.  Patient learns that negative thoughts can cause depression and anxiety. This can result in negative lifestyle behavior and serious health problems. Cognitive behavioral therapy is an effective method to help control our thoughts in order to change and improve our emotional outlook.  Additional Videos:  Exercise    Improving Performance  Clinical staff conducted group or individual video education with verbal and written material and guidebook.  Patient learns to use a non-linear approach by alternating intensity levels and lengths of time spent exercising to help burn more calories and lose more body fat. Cardiovascular exercise helps improve heart health, metabolism, hormonal balance, blood sugar control, and recovery from fatigue. Resistance training improves strength, endurance, balance, coordination, reaction time, metabolism, and muscle mass. Flexibility exercise improves circulation, posture, and balance. Seek guidance from your physician and exercise physiologist before implementing an exercise routine and learn your capabilities and proper form for all exercise.  Introduction to Yoga  Clinical staff conducted group or individual video education with verbal and written material and guidebook.  Patient learns about yoga, a discipline of the coming together of mind, breath, and body. The benefits of yoga include improved flexibility, improved range of motion, better posture and  core strength, increased lung function, weight loss, and positive self-image. Yoga's heart health benefits include lowered blood pressure, healthier heart rate, decreased cholesterol and triglyceride levels, improved immune function, and reduced stress. Seek guidance from your physician and exercise physiologist before implementing an exercise routine and learn your capabilities and proper form for all exercise.  Medical   Aging: Enhancing Your Quality of Life  Clinical staff conducted group or individual video education with verbal and written material and guidebook.  Patient learns key strategies and recommendations to stay in  good physical health and enhance quality of life, such as prevention strategies, having an advocate, securing a [place], and keeping a list of medications and system for tracking them. It also discusses how to avoid risk for bone loss.  Biology of Weight Control  Clinical staff conducted group or individual video education with verbal and written material and guidebook.  Patient learns that weight gain occurs because we consume more calories than we burn (eating more, moving less). Even if your body weight is normal, you may have higher ratios of fat compared to muscle mass. Too much body fat puts you at increased risk for cardiovascular disease, heart attack, stroke, type 2 diabetes, and obesity-related cancers. In addition to exercise, following the [place] can help reduce your risk.  Decoding Lab Results  Clinical staff conducted group or individual video education with verbal and written material and guidebook.  Patient learns that lab test reflects one measurement whose values change over time and are influenced by many factors, including medication, stress, sleep, exercise, food, hydration, pre-existing medical conditions, and more. It is recommended to use the knowledge from this video to become more involved with your lab  results and evaluate your numbers to speak with your doctor.   Diseases of Our Time - Overview  Clinical staff conducted group or individual video education with verbal and written material and guidebook.  Patient learns that according to the CDC, 50% to 70% of chronic diseases (such as obesity, type 2 diabetes, elevated lipids, hypertension, and heart disease) are avoidable through lifestyle improvements including healthier food choices, listening to satiety cues, and increased physical activity.  Sleep Disorders Clinical staff conducted group or individual video education with verbal and written material and guidebook.  Patient learns how good quality and duration of sleep are important to overall health and well-being. Patient also learns about sleep disorders and how they impact health along with recommendations to address them, including discussing with a physician.  Nutrition  Dining Out - Part 2 Clinical staff conducted group or individual video education with verbal and written material and guidebook.  Patient learns how to plan ahead and communicate in order to maximize their dining experience in a healthy and nutritious manner. Included are recommended food choices based on the type of restaurant the patient is visiting.   Fueling a Best boy conducted group or individual video education with verbal and written material and guidebook.  There is a strong connection between our food choices and our health. Diseases like obesity and type 2 diabetes are very prevalent and are in large-part due to lifestyle choices. The Pritikin Eating Plan provides plenty of food and hunger-curbing satisfaction. It is easy to follow, affordable, and helps reduce health risks.  Menu Workshop  Clinical staff conducted group or individual video education with verbal and written material and guidebook.  Patient learns that restaurant meals can sabotage health goals because they are often  packed with calories, fat, sodium, and sugar. Recommendations include strategies to plan ahead and to communicate with the manager, chef, or server to help order a healthier meal.  Planning Your Eating Strategy  Clinical staff conducted group or individual video education with verbal and written material and guidebook.  Patient learns about the [place] and its benefit of reducing the risk of disease. The [place] does not focus on calories. Instead, it emphasizes high-quality, nutrient-rich foods. By knowing the characteristics of the foods, we  choose, we can determine their calorie density and make informed decisions.  Targeting Your Nutrition Priorities  Clinical staff conducted group or individual video education with verbal and written material and guidebook.  Patient learns that lifestyle habits have a tremendous impact on disease risk and progression. This video provides eating and physical activity recommendations based on your personal health goals, such as reducing LDL cholesterol, losing weight, preventing or controlling type 2 diabetes, and reducing high blood pressure.  Vitamins and Minerals  Clinical staff conducted group or individual video education with verbal and written material and guidebook.  Patient learns different ways to obtain key vitamins and minerals, including through a recommended healthy diet. It is important to discuss all supplements you take with your doctor.   Healthy Mind-Set    Smoking Cessation  Clinical staff conducted group or individual video education with verbal and written material and guidebook.  Patient learns that cigarette smoking and tobacco addiction pose a serious health risk which affects millions of people. Stopping smoking will significantly reduce the risk of heart disease, lung disease, and many forms of cancer. Recommended strategies for quitting are covered, including working with your doctor to develop a successful  plan.  Culinary   Becoming a Financial trader conducted group or individual video education with verbal and written material and guidebook.  Patient learns that cooking at home can be healthy, cost-effective, quick, and puts them in control. Keys to cooking healthy recipes will include looking at your recipe, assessing your equipment needs, planning ahead, making it simple, choosing cost-effective seasonal ingredients, and limiting the use of added fats, salts, and sugars.  Cooking - Breakfast and Snacks  Clinical staff conducted group or individual video education with verbal and written material and guidebook.  Patient learns how important breakfast is to satiety and nutrition through the entire day. Recommendations include key foods to eat during breakfast to help stabilize blood sugar levels and to prevent overeating at meals later in the day. Planning ahead is also a key component.  Cooking - Human resources officer conducted group or individual video education with verbal and written material and guidebook.  Patient learns eating strategies to improve overall health, including an approach to cook more at home. Recommendations include thinking of animal protein as a side on your plate rather than center stage and focusing instead on lower calorie dense options like vegetables, fruits, whole grains, and plant-based proteins, such as beans. Making sauces in large quantities to freeze for later and leaving the skin on your vegetables are also recommended to maximize your experience.  Cooking - Healthy Salads and Dressing Clinical staff conducted group or individual video education with verbal and written material and guidebook.  Patient learns that vegetables, fruits, whole grains, and legumes are the foundations of the [place]. Recommendations include how to incorporate each of these in flavorful and healthy salads, and how to create homemade salad dressings.  Proper handling of ingredients is also covered. Cooking - Soups and [employer] - Soups and Desserts Clinical staff conducted group or individual video education with verbal and written material and guidebook.  Patient learns that Pritikin soups and desserts make for easy, nutritious, and delicious snacks and meal components that are low in sodium, fat, sugar, and calorie density, while high in vitamins, minerals, and filling fiber. Recommendations include simple and healthy ideas for soups and desserts.   Overview     The Pritikin Solution Program Overview Clinical staff  conducted group or individual video education with verbal and written material and guidebook.  Patient learns that the results of the [place] Program have been documented in more than 100 articles published in peer-reviewed journals, and the benefits include reducing risk factors for (and, in some cases, even reversing) high cholesterol, high blood pressure, type 2 diabetes, obesity, and more! An overview of the three key pillars of the Pritikin Program will be covered: eating well, doing regular exercise, and having a healthy mind-set.  WORKSHOPS  Exercise: Exercise Basics: Building Your Action Plan Clinical staff led group instruction and group discussion with PowerPoint presentation and patient guidebook. To enhance the learning environment the use of posters, models and videos may be added. At the conclusion of this workshop, patients will comprehend the difference between physical activity and exercise, as well as the benefits of incorporating both, into their routine. Patients will understand the FITT (Frequency, Intensity, Time, and Type) principle and how to use it to build an exercise action plan. In addition, safety concerns and other considerations for exercise and cardiac rehab will be addressed by the presenter. The purpose of this lesson is to promote a comprehensive and effective weekly exercise routine in  order to improve patients' overall level of fitness.   Managing Heart Disease: Your Path to a Healthier Heart Clinical staff led group instruction and group discussion with PowerPoint presentation and patient guidebook. To enhance the learning environment the use of posters, models and videos may be added.At the conclusion of this workshop, patients will understand the anatomy and physiology of the heart. Additionally, they will understand how Pritikin's three pillars impact the risk factors, the progression, and the management of heart disease.  The purpose of this lesson is to provide a high-level overview of the heart, heart disease, and how the Pritikin lifestyle positively impacts risk factors.  Exercise Biomechanics Clinical staff led group instruction and group discussion with PowerPoint presentation and patient guidebook. To enhance the learning environment the use of posters, models and videos may be added. Patients will learn how the structural parts of their bodies function and how these functions impact their daily activities, movement, and exercise. Patients will learn how to promote a neutral spine, learn how to manage pain, and identify ways to improve their physical movement in order to promote healthy living. The purpose of this lesson is to expose patients to common physical limitations that impact physical activity. Participants will learn practical ways to adapt and manage aches and pains, and to minimize their effect on regular exercise. Patients will learn how to maintain good posture while sitting, walking, and lifting.  Balance Training and Fall Prevention  Clinical staff led group instruction and group discussion with PowerPoint presentation and patient guidebook. To enhance the learning environment the use of posters, models and videos may be added. At the conclusion of this workshop, patients will understand the importance of their sensorimotor skills (vision,  proprioception, and the vestibular system) in maintaining their ability to balance as they age. Patients will apply a variety of balancing exercises that are appropriate for their current level of function. Patients will understand the common causes for poor balance, possible solutions to these problems, and ways to modify their physical environment in order to minimize their fall risk. The purpose of this lesson is to teach patients about the importance of maintaining balance as they age and ways to minimize their risk of falling.  WORKSHOPS   Nutrition:  Fueling a Scientist, research (physical sciences) led group  instruction and group discussion with PowerPoint presentation and patient guidebook. To enhance the learning environment the use of posters, models and videos may be added. Patients will review the foundational principles of the [place] and understand what constitutes a serving size in each of the food groups. Patients will also learn Pritikin-friendly foods that are better choices when away from home and review make-ahead meal and snack options. Calorie density will be reviewed and applied to three nutrition priorities: weight maintenance, weight loss, and weight gain. The purpose of this lesson is to reinforce (in a group setting) the key concepts around what patients are recommended to eat and how to apply these guidelines when away from home by planning and selecting Pritikin-friendly options. Patients will understand how calorie density may be adjusted for different weight management goals.  Mindful Eating  Clinical staff led group instruction and group discussion with PowerPoint presentation and patient guidebook. To enhance the learning environment the use of posters, models and videos may be added. Patients will briefly review the concepts of the [place] and the importance of low-calorie dense foods. The concept of mindful eating will be introduced as well as the  importance of paying attention to internal hunger signals. Triggers for non-hunger eating and techniques for dealing with triggers will be explored. The purpose of this lesson is to provide patients with the opportunity to review the basic principles of the [place], discuss the value of eating mindfully and how to measure internal cues of hunger and fullness using the Hunger Scale. Patients will also discuss reasons for non-hunger eating and learn strategies to use for controlling emotional eating.  Targeting Your Nutrition Priorities Clinical staff led group instruction and group discussion with PowerPoint presentation and patient guidebook. To enhance the learning environment the use of posters, models and videos may be added. Patients will learn how to determine their genetic susceptibility to disease by reviewing their family history. Patients will gain insight into the importance of diet as part of an overall healthy lifestyle in mitigating the impact of genetics and other environmental insults. The purpose of this lesson is to provide patients with the opportunity to assess their personal nutrition priorities by looking at their family history, their own health history and current risk factors. Patients will also be able to discuss ways of prioritizing and modifying the [place] for their highest risk areas  Menu  Clinical staff led group instruction and group discussion with PowerPoint presentation and patient guidebook. To enhance the learning environment the use of posters, models and videos may be added. Using menus brought in from [employer], or printed from [employer], patients will apply the [place] dining out guidelines that were presented in the [employer] video. Patients will also be able to practice these guidelines in a variety of provided scenarios. The purpose of this lesson is to provide patients with the opportunity to practice  hands-on learning of the [place] with actual menus and practice scenarios.  Label Reading Clinical staff led group instruction and group discussion with PowerPoint presentation and patient guidebook. To enhance the learning environment the use of posters, models and videos may be added. Patients will review and discuss the Pritikin label reading guidelines presented in Pritikin's Label Reading Educational series video. Using fool labels brought in from local grocery stores and markets, patients will apply the label reading guidelines and determine if the packaged food meet the Pritikin guidelines. The purpose of  this lesson is to provide patients with the opportunity to review, discuss, and practice hands-on learning of the Pritikin Label Reading guidelines with actual packaged food labels. [place] Workshops are designed to teach patients ways to prepare quick, simple, and affordable recipes at home. The importance of nutrition's role in chronic disease risk reduction is reflected in its emphasis in the overall Pritikin program. By learning how to prepare essential core Pritikin Eating Plan recipes, patients will increase control over what they eat; be able to customize the flavor of foods without the use of added salt, sugar, or fat; and improve the quality of the food they consume. By learning a set of core recipes which are easily assembled, quickly prepared, and affordable, patients are more likely to prepare more healthy foods at home. These workshops focus on convenient breakfasts, simple entres, side dishes, and desserts which can be prepared with minimal effort and are consistent with nutrition recommendations for cardiovascular risk reduction. Cooking [employer] are taught by a Engineer, materials (RD) who has been trained by the [employer]. The chef or RD has a clear understanding of the importance of minimizing -  if not completely eliminating - added fat, sugar, and sodium in recipes. Throughout the series of [place] Workshop sessions, patients will learn about healthy ingredients and efficient methods of cooking to build confidence in their capability to prepare    Cooking School weekly topics:  Adding Flavor- Sodium-Free  Fast and Healthy Breakfasts  Powerhouse Plant-Based Proteins  Satisfying Salads and Dressings  Simple Sides and Sauces  International Cuisine-Spotlight on the [place] Zones  Delicious Desserts  Savory Soups  Efficiency Cooking - Meals in a Snap  Tasty Appetizers and Snacks  Comforting Weekend Breakfasts  One-Pot Wonders   Fast Evening Meals  Easy [place] (Psychosocial): New Thoughts, New Behaviors Clinical staff led group instruction and group discussion with PowerPoint presentation and patient guidebook. To enhance the learning environment the use of posters, models and videos may be added. Patients will learn and practice techniques for developing effective health and lifestyle goals. Patients will be able to effectively apply the goal setting process learned to develop at least one new personal goal.  The purpose of this lesson is to expose patients to a new skill set of behavior modification techniques such as techniques setting SMART goals, overcoming barriers, and achieving new thoughts and new behaviors.  Managing Moods and Relationships Clinical staff led group instruction and group discussion with PowerPoint presentation and patient guidebook. To enhance the learning environment the use of posters, models and videos may be added. Patients will learn how emotional and chronic stress factors can impact their health and relationships. They will learn healthy ways to manage their moods and utilize positive coping mechanisms. In addition, ICR patients will learn ways to improve communication skills.  The purpose of this lesson is to expose patients to ways of understanding how one's mood and health are intimately connected. Developing a healthy outlook can help build positive relationships and connections with others. Patients will understand the importance of utilizing effective communication skills that include actively listening and being heard. They will learn and understand the importance of the "4 Cs" and especially Connections in fostering of a Healthy Mind-Set.  Healthy Sleep for a Healthy Heart Clinical staff led group instruction and group discussion with PowerPoint presentation and patient guidebook. To enhance the learning environment  the use of posters, models and videos may be added. At the conclusion of this workshop, patients will be able to demonstrate knowledge of the importance of sleep to overall health, well-being, and quality of life. They will understand the symptoms of, and treatments for, common sleep disorders. Patients will also be able to identify daytime and nighttime behaviors which impact sleep, and they will be able to apply these tools to help manage sleep-related challenges. The purpose of this lesson is to provide patients with a general overview of sleep and outline the importance of quality sleep. Patients will learn about a few of the most common sleep disorders. Patients will also be introduced to the concept of "sleep hygiene," and discover ways to self-manage certain sleeping problems through simple daily behavior changes. Finally, the workshop will motivate patients by clarifying the links between quality sleep and their goals of heart-healthy living.   Recognizing and Reducing Stress Clinical staff led group instruction and group discussion with PowerPoint presentation and patient guidebook. To enhance the learning environment the use of posters, models and videos may be added. At the conclusion of this workshop, patients will be able to understand the types of  stress reactions, differentiate between acute and chronic stress, and recognize the impact that chronic stress has on their health. They will also be able to apply different coping mechanisms, such as reframing negative self-talk. Patients will have the opportunity to practice a variety of stress management techniques, such as deep abdominal breathing, progressive muscle relaxation, and/or guided imagery.  The purpose of this lesson is to educate patients on the role of stress in their lives and to provide healthy techniques for coping with it.  Learning Barriers/Preferences:  Learning Barriers/Preferences - 10/11/22 0956       Learning Barriers/Preferences   Learning Barriers Sight;Hearing   wears glasses, HOH both ears   Learning Preferences Verbal Instruction;Written Material;Computer/Internet             Education Topics:  Knowledge Questionnaire Score:  Knowledge Questionnaire Score - 10/11/22 0958       Knowledge Questionnaire Score   Pre Score 19/24             Core Components/Risk Factors/Patient Goals at Admission:  Personal Goals and Risk Factors at Admission - 10/11/22 0958       Core Components/Risk Factors/Patient Goals on Admission    Weight Management Yes;Obesity;Weight Loss    Intervention Weight Management: Develop a combined nutrition and exercise program designed to reach desired caloric intake, while maintaining appropriate intake of nutrient and fiber, sodium and fats, and appropriate energy expenditure required for the weight goal.;Weight Management: Provide education and appropriate resources to help participant work on and attain dietary goals.;Weight Management/Obesity: Establish reasonable short term and long term weight goals.;Obesity: Provide education and appropriate resources to help participant work on and attain dietary goals.    Admit Weight 210 lb 15.7 oz (95.7 kg)    Goal Weight: Long Term 190 lb (86.2 kg)   Pt goal   Expected Outcomes Short  Term: Continue to assess and modify interventions until short term weight is achieved;Long Term: Adherence to nutrition and physical activity/exercise program aimed toward attainment of established weight goal;Weight Maintenance: Understanding of the daily nutrition guidelines, which includes 25-35% calories from fat, 7% or less cal from saturated fats, less than '200mg'$  cholesterol, less than 1.5gm of sodium, & 5 or more servings of fruits and vegetables daily;Weight Loss: Understanding of general recommendations for a balanced deficit meal plan,  which promotes 1-2 lb weight loss per week and includes a negative energy balance of [phone] kcal/d;Understanding recommendations for meals to include 15-35% energy as protein, 25-35% energy from fat, 35-60% energy from carbohydrates, less than '200mg'$  of dietary cholesterol, 20-35 gm of total fiber daily;Understanding of distribution of calorie intake throughout the day with the consumption of 4-5 meals/snacks    Hypertension Yes    Intervention Provide education on lifestyle modifcations including regular physical activity/exercise, weight management, moderate sodium restriction and increased consumption of fresh fruit, vegetables, and low fat dairy, alcohol moderation, and smoking cessation.;Monitor prescription use compliance.    Expected Outcomes Short Term: Continued assessment and intervention until BP is < 140/79m HG in hypertensive participants. < 130/847mHG in hypertensive participants with diabetes, heart failure or chronic kidney disease.;Long Term: Maintenance of blood pressure at goal levels.    Lipids Yes    Intervention Provide education and support for participant on nutrition & aerobic/resistive exercise along with prescribed medications to achieve LDL '70mg'$ , HDL >'40mg'$ .    Expected Outcomes Short Term: Participant states understanding of desired cholesterol values and is compliant with medications prescribed. Participant is following exercise  prescription and nutrition guidelines.;Long Term: Cholesterol controlled with medications as prescribed, with individualized exercise RX and with personalized nutrition plan. Value goals: LDL < '70mg'$ , HDL > 40 mg.    Stress Yes    Intervention Offer individual and/or small group education and counseling on adjustment to heart disease, stress management and health-related lifestyle change. Teach and support self-help strategies.;Refer participants experiencing significant psychosocial distress to appropriate mental health specialists for further evaluation and treatment. When possible, include family members and significant others in education/counseling sessions.    Expected Outcomes Short Term: Participant demonstrates changes in health-related behavior, relaxation and other stress management skills, ability to obtain effective social support, and compliance with psychotropic medications if prescribed.;Long Term: Emotional wellbeing is indicated by absence of clinically significant psychosocial distress or social isolation.             Core Components/Risk Factors/Patient Goals Review:    Core Components/Risk Factors/Patient Goals at Discharge (Final Review):    ITP Comments:  ITP Comments     Row Name 10/11/22 0848           ITP Comments Dr TrFransico Himd, Medical Director, Introduction to [facility] program, Intensive Cardiac Rehab. Initial orientation packet reviewed with the patient                Comments: Participant attended orientation for the cardiac rehabilitation program on  10/11/2022  to perform initial intake and exercise walk test. Patient introduced to the [facility] and orientation packet was reviewed. Completed 6-minute walk test, measurements, initial ITP, and exercise prescription. Vital signs stable. Telemetry-normal sinus rhythm first degree heart block with frequent PAC's occasional PVC, tall t wave this has been previously documented  asymptomatic. GaAnsensed a rolling walker for stability as he has a history of foot drop.MaHarrell GaveN BSN   Service time was from [phone]

## 2022-10-14 ENCOUNTER — Other Ambulatory Visit: Payer: Self-pay | Admitting: Physician Assistant

## 2022-10-14 MED ORDER — FUROSEMIDE 40 MG PO TABS: 60.0000 mg | ORAL_TABLET | ORAL | 11 refills | Status: DC

## 2022-10-17 ENCOUNTER — Telehealth (HOSPITAL_COMMUNITY): Payer: Self-pay | Admitting: *Deleted

## 2022-10-17 ENCOUNTER — Encounter (HOSPITAL_COMMUNITY): Payer: Self-pay | Admitting: *Deleted

## 2022-10-17 ENCOUNTER — Encounter (HOSPITAL_COMMUNITY): Payer: Medicare Other

## 2022-10-17 DIAGNOSIS — Z952 Presence of prosthetic heart valve: Secondary | ICD-10-CM

## 2022-10-17 NOTE — Telephone Encounter (Signed)
Patient left message on department voicemail this morning that he will be absent from cardiac rehab today. He's been up all night and thinks he has a stomach flu. Requested call back. Will forward to nurse case manager.

## 2022-10-17 NOTE — Progress Notes (Signed)
Cardiac Individual Treatment Plan  Patient Details  Name: Patrick Franco MRN: [id number] Date of Birth: Aug 18, 1932 Referring Provider:   Flowsheet Row INTENSIVE CARDIAC REHAB ORIENT from 10/11/2022 in [facility] for Heart, Vascular, & [place]  Referring Provider [name], MD       Initial Encounter Date:  [place] from 10/11/2022 in [facility] for Heart, Vascular, & Lung [facility]  Date 10/11/22       Visit Diagnosis: 07/26/22 TAVR  Patient's Home Medications on Admission:  Current Outpatient Medications:    acetaminophen (TYLENOL) 500 MG tablet, Take 1,000 mg by mouth every 6 (six) hours as needed for moderate pain., Disp: , Rfl:    allopurinol (ZYLOPRIM) 100 MG tablet, Take 2 tablets (200 mg total) by mouth daily., Disp: , Rfl:    amiodarone (PACERONE) 200 MG tablet, Take 1 tablet (200 mg total) by mouth daily., Disp: 90 tablet, Rfl: 1   amoxicillin (AMOXIL) 500 MG tablet, Take 4 tablets (2,000 mg total) by mouth as directed. 1 hour prior to dental work including cleanings, Disp: 12 tablet, Rfl: 12   apixaban (ELIQUIS) 5 MG TABS tablet, Take 1 tablet (5 mg total) by mouth 2 (two) times daily., Disp: 180 tablet, Rfl: 2   atorvastatin (LIPITOR) 10 MG tablet, Take 10 mg by mouth every morning., Disp: , Rfl: 10   cyanocobalamin (VITAMIN B12) 1000 MCG tablet, Take 1,000 mcg by mouth daily., Disp: , Rfl:    diltiazem (CARDIZEM CD) 240 MG 24 hr capsule, TAKE (1) CAPSULE DAILY., Disp: 30 capsule, Rfl: 9   dutasteride (AVODART) 0.5 MG capsule, Take 0.5 mg by mouth every morning., Disp: , Rfl: 2   furosemide (LASIX) 40 MG tablet, Take 1.5 tablets (60 mg total) by mouth 4 (four) times a week., Disp: 30 tablet, Rfl: 11   metoprolol tartrate (LOPRESSOR) 25 MG tablet, Take 1 tablet (25 mg total) by mouth daily., Disp: 90 tablet, Rfl: 3   mirtazapine (REMERON) 30 MG tablet, TAKE ONE TABLET AT  BEDTIME., Disp: 90 tablet, Rfl: 0   Multiple Vitamin (MULTIVITAMIN) tablet, Take 1 tablet by mouth daily., Disp: , Rfl:    Omega-3 Fatty Acids (FISH OIL) 1200 MG CAPS, Take 1,200 mg by mouth daily., Disp: , Rfl:    pantoprazole (PROTONIX) 40 MG tablet, Take 1 tablet (40 mg total) by mouth daily., Disp: , Rfl:    Polyethyl Glycol-Propyl Glycol (SYSTANE) 0.4-0.3 % SOLN, Place 1 drop into both eyes 2 (two) times daily., Disp: , Rfl:    potassium chloride SA (KLOR-CON M) 20 MEQ tablet, TAKE 1 TABLET BY MOUTH IN THE MORNING, TAKE 1/2 TABLET BY MOUTH IN THE P.M., Disp: 135 tablet, Rfl: 3   Probiotic Product (PROBIOTIC DAILY PO), Take 1 tablet by mouth daily., Disp: , Rfl:    pyridOXINE (VITAMIN B-6) 100 MG tablet, Take 100 mg by mouth daily., Disp: , Rfl:    spironolactone (ALDACTONE) 25 MG tablet, Take 1 tablet (25 mg total) by mouth daily., Disp: 90 tablet, Rfl: 2   tamsulosin (FLOMAX) 0.4 MG CAPS capsule, Take 0.4 mg by mouth daily after breakfast., Disp: , Rfl: 2   Turmeric 500 MG CAPS, Take 500 mg by mouth daily., Disp: , Rfl:   Past Medical History: Past Medical History:  Diagnosis Date   Aortic stenosis    Arthritis    Atrial fibrillation (HCC)    BPH (benign prostatic hyperplasia)    Chronic low back  pain    Depression    Diverticulitis    Dyspnea    Elevated PSA    Foot drop, right    Gait disorder 02/03/2015   Hip pain    History of colonoscopy    History of nuclear stress test    Myoview 10/22: EF 67, no ischemia or infarction; low risk   HOH (hard of hearing)    hearing aid   Hypertension    Lumbar radiculopathy    Mitral valve prolapse    Onychomycosis    Peptic ulcer disease    S/P TAVR (transcatheter aortic valve replacement) 07/26/2022   54m S3UR via TF approach with Dr. MAngelena Formand Dr. BRandolm Idol  Sepsis ([facility] 2005   e. coli-after prostate bx   Sleep apnea    Torn ACL    Tracheobronchomalacia    seen on 2019 Chest CT    Tobacco Use: Social History    Tobacco Use  Smoking Status Former   Packs/day: 0.50   Types: Cigarettes   Start date: 137  Quit date: 1961   Years since quitting: 62.9  Smokeless Tobacco Never  Tobacco Comments   Pt smoked for 649monthwhen in [employer]    Labs: Review Flowsheet       Latest Ref Rng & Units 03/06/2020 05/26/2022 07/26/2022  Labs for ITP Cardiac and Pulmonary Rehab  Cholestrol 0 - 200 mg/dL 96  - -  LDL (calc) 0 - 99 mg/dL 38  - -  HDL-C >40 mg/dL 48  - -  Trlycerides <150 mg/dL 50  - -  PH, Arterial 7.35 - 7.45 - 7.432  -  PCO2 arterial 32 - 48 mmHg - 34.5  -  Bicarbonate 20.0 - 28.0 mmol/L - 23.0  -  TCO2 22 - 32 mmol/L - '24  20  15  23  23   '$ Acid-base deficit 0.0 - 2.0 mmol/L - 1.0  -  O2 Saturation % - 97  -    Capillary Blood Glucose: Lab Results  Component Value Date   GLUCAP 172 (H) 07/27/2022   GLUCAP 94 07/26/2022     Exercise Target Goals: Exercise Program Goal: Individual exercise prescription set using results from initial 6 min walk test and THRR while considering  patient's activity barriers and safety.   Exercise Prescription Goal: Initial exercise prescription builds to 30-45 minutes a day of aerobic activity, 2-3 days per week.  Home exercise guidelines will be given to patient during program as part of exercise prescription that the participant will acknowledge.  Activity Barriers & Risk Stratification:  Activity Barriers & Cardiac Risk Stratification - 10/11/22 1001       Activity Barriers & Cardiac Risk Stratification   Activity Barriers Arthritis;Back Problems;Neck/Spine Problems;Joint Problems;Deconditioning;Muscular Weakness;Balance Concerns;History of Falls    Cardiac Risk Stratification High             6 Minute Walk:  6 Minute Walk     Row Name 10/11/22 1000         6 Minute Walk   Phase Initial     Distance 1005 feet  Used GOCART for balance concerns     Walk Time 6 minutes     # of Rest Breaks 0     MPH 1.9     METS 1.42     RPE 11      Perceived Dyspnea  0     VO2 Peak 5     Symptoms No     Resting  HR 65 bpm     Resting BP 108/74     Resting Oxygen Saturation  96 %     Exercise Oxygen Saturation  during 6 min walk 93 %     Max Ex. HR 101 bpm     Max Ex. BP 152/72     2 Minute Post BP 110/70              Oxygen Initial Assessment:   Oxygen Re-Evaluation:   Oxygen Discharge (Final Oxygen Re-Evaluation):   Initial Exercise Prescription:  Initial Exercise Prescription - 10/11/22 1000       Date of Initial Exercise RX and Referring Provider   Date 10/11/22    Referring Provider [name], MD    Expected Discharge Date 12/09/22      NuStep   Level 1    SPM 75    Minutes 30    METs 1.5      Prescription Details   Frequency (times per week) 3    Duration Progress to 30 minutes of continuous aerobic without signs/symptoms of physical distress      Intensity   THRR 40-80% of Max Heartrate 53-106    Ratings of Perceived Exertion 11-13    Perceived Dyspnea 0-4      Progression   Progression Continue progressive overload as per policy without signs/symptoms or physical distress.      Resistance Training   Training Prescription Yes    Weight 3 lbs    Reps 10-15             Perform Capillary Blood Glucose checks as needed.  Exercise Prescription Changes:   Exercise Comments:   Exercise Goals and Review:   Exercise Goals     Row Name 10/11/22 1002             Exercise Goals   Increase Physical Activity Yes       Intervention Provide advice, education, support and counseling about physical activity/exercise needs.;Develop an individualized exercise prescription for aerobic and resistive training based on initial evaluation findings, risk stratification, comorbidities and participant's personal goals.       Expected Outcomes Short Term: Attend rehab on a regular basis to increase amount of physical activity.;Long Term: Add in home exercise to make exercise part of  routine and to increase amount of physical activity.;Long Term: Exercising regularly at least 3-5 days a week.       Increase Strength and Stamina Yes       Intervention Develop an individualized exercise prescription for aerobic and resistive training based on initial evaluation findings, risk stratification, comorbidities and participant's personal goals.;Provide advice, education, support and counseling about physical activity/exercise needs.       Expected Outcomes Short Term: Increase workloads from initial exercise prescription for resistance, speed, and METs.;Short Term: Perform resistance training exercises routinely during rehab and add in resistance training at home;Long Term: Improve cardiorespiratory fitness, muscular endurance and strength as measured by increased METs and functional capacity (6MWT)       Able to understand and use rate of perceived exertion (RPE) scale Yes       Intervention Provide education and explanation on how to use RPE scale       Expected Outcomes Short Term: Able to use RPE daily in rehab to express subjective intensity level;Long Term:  Able to use RPE to guide intensity level when exercising independently       Knowledge and understanding of Target Heart Rate Range (THRR) Yes  Intervention Provide education and explanation of THRR including how the numbers were predicted and where they are located for reference       Expected Outcomes Short Term: Able to state/look up THRR;Short Term: Able to use daily as guideline for intensity in rehab;Long Term: Able to use THRR to govern intensity when exercising independently       Understanding of Exercise Prescription Yes       Intervention Provide education, explanation, and written materials on patient's individual exercise prescription       Expected Outcomes Short Term: Able to explain program exercise prescription;Long Term: Able to explain home exercise prescription to exercise independently                 Exercise Goals Re-Evaluation :   Discharge Exercise Prescription (Final Exercise Prescription Changes):   Nutrition:  Target Goals: Understanding of nutrition guidelines, daily intake of sodium '1500mg'$ , cholesterol '200mg'$ , calories 30% from fat and 7% or less from saturated fats, daily to have 5 or more servings of fruits and vegetables.  Biometrics:  Pre Biometrics - 10/11/22 0800       Pre Biometrics   Waist Circumference 44 inches    Hip Circumference 45 inches    Waist to Hip Ratio 0.98 %    Triceps Skinfold 17 mm    % Body Fat 31.9 %    Grip Strength 30 kg    Flexibility --   Not perfomed; significant back issues   Single Leg Stand 1 seconds              Nutrition Therapy Plan and Nutrition Goals:   Nutrition Assessments:  MEDIFICTS Score Key: ?70 Need to make dietary changes  40-70 Heart Healthy Diet ? 40 Therapeutic Level Cholesterol Diet    Picture Your Plate Scores: <96 Unhealthy dietary pattern with much room for improvement. 41-50 Dietary pattern unlikely to meet recommendations for good health and room for improvement. 51-60 More healthful dietary pattern, with some room for improvement.  >60 Healthy dietary pattern, although there may be some specific behaviors that could be improved.    Nutrition Goals Re-Evaluation:   Nutrition Goals Re-Evaluation:   Nutrition Goals Discharge (Final Nutrition Goals Re-Evaluation):   Psychosocial: Target Goals: Acknowledge presence or absence of significant depression and/or stress, maximize coping skills, provide positive support system. Participant is able to verbalize types and ability to use techniques and skills needed for reducing stress and depression.  Initial Review & Psychosocial Screening:  Initial Psych Review & Screening - 10/11/22 0851       Initial Review   Current issues with None Identified;History of Depression;Current Stress Concerns   [place] denies being depressed currently    Source of Stress Concerns Chronic Illness;Unable to participate in former interests or hobbies;Unable to perform yard/household activities      [place]? Yes   [place] has his wife for support     Barriers   Psychosocial barriers to participate in program There are no identifiable barriers or psychosocial needs.      Screening Interventions   Interventions Encouraged to exercise;Provide feedback about the scores to participant    Expected Outcomes Long Term Goal: Stressors or current issues are controlled or eliminated.             Quality of Life Scores:  Quality of Life - 10/11/22 0956       Quality of Life   Select Quality of Life  Quality of Life Scores   Health/Function Pre 19.83 %    Socioeconomic Pre 28.33 %    Psych/Spiritual Pre 20.93 %    Family Pre 27 %    GLOBAL Pre 22.7 %            Scores of 19 and below usually indicate a poorer quality of life in these areas.  A difference of  2-3 points is a clinically meaningful difference.  A difference of 2-3 points in the total score of the Quality of Life Index has been associated with significant improvement in overall quality of life, self-image, physical symptoms, and general health in studies assessing change in quality of life.  PHQ-9: Review Flowsheet       10/11/2022  Depression screen PHQ 2/9  Decreased Interest 0  Down, Depressed, Hopeless 0  PHQ - 2 Score 0   Interpretation of Total Score  Total Score Depression Severity:  1-4 = Minimal depression, 5-9 = Mild depression, 10-14 = Moderate depression, 15-19 = Moderately severe depression, 20-27 = Severe depression   Psychosocial Evaluation and Intervention:   Psychosocial Re-Evaluation:   Psychosocial Discharge (Final Psychosocial Re-Evaluation):   Vocational Rehabilitation: Provide vocational rehab assistance to qualifying candidates.   Vocational Rehab Evaluation & Intervention:  Vocational Rehab - 10/11/22  4270       Initial Vocational Rehab Evaluation & Intervention   Assessment shows need for Vocational Rehabilitation No   [place] is retired and does not need vocational rehab at this time            Education: Education Goals: Education classes will be provided on a weekly basis, covering required topics. Participant will state understanding/return demonstration of topics presented.     Core Videos: Exercise    Move It!  Clinical staff conducted group or individual video education with verbal and written material and guidebook.  Patient learns the recommended Pritikin exercise program. Exercise with the goal of living a long, healthy life. Some of the health benefits of exercise include controlled diabetes, healthier blood pressure levels, improved cholesterol levels, improved heart and lung capacity, improved sleep, and better body composition. Everyone should speak with their doctor before starting or changing an exercise routine.  Biomechanical Limitations Clinical staff conducted group or individual video education with verbal and written material and guidebook.  Patient learns how biomechanical limitations can impact exercise and how we can mitigate and possibly overcome limitations to have an impactful and balanced exercise routine.  Body Composition Clinical staff conducted group or individual video education with verbal and written material and guidebook.  Patient learns that body composition (ratio of muscle mass to fat mass) is a key component to assessing overall fitness, rather than body weight alone. Increased fat mass, especially visceral belly fat, can put [place] at increased risk for metabolic syndrome, type 2 diabetes, heart disease, and even death. It is recommended to combine diet and exercise (cardiovascular and resistance training) to improve your body composition. Seek guidance from your physician and exercise physiologist before implementing an exercise  routine.  Exercise Action Plan Clinical staff conducted group or individual video education with verbal and written material and guidebook.  Patient learns the recommended strategies to achieve and enjoy long-term exercise adherence, including variety, self-motivation, self-efficacy, and positive decision making. Benefits of exercise include fitness, good health, weight management, more energy, better sleep, less stress, and overall well-being.  Medical   Heart Disease Risk Reduction Clinical staff conducted group or individual video education with verbal and written  material and guidebook.  Patient learns our heart is our most vital organ as it circulates oxygen, nutrients, white blood cells, and hormones throughout the entire body, and carries waste away. Data supports a plant-based eating plan like the Pritikin Program for its effectiveness in slowing progression of and reversing heart disease. The video provides a number of recommendations to address heart disease.   Metabolic Syndrome and Belly Fat  Clinical staff conducted group or individual video education with verbal and written material and guidebook.  Patient learns what metabolic syndrome is, how it leads to heart disease, and how one can reverse it and keep it from coming back. You have metabolic syndrome if you have 3 of the following 5 criteria: abdominal obesity, high blood pressure, high triglycerides, low HDL cholesterol, and high blood sugar.  Hypertension and Heart Disease Clinical staff conducted group or individual video education with verbal and written material and guidebook.  Patient learns that high blood pressure, or hypertension, is very common in the [place]. Hypertension is largely due to excessive salt intake, but other important risk factors include being overweight, physical inactivity, drinking too much alcohol, smoking, and not eating enough potassium from fruits and vegetables. High blood pressure is a  leading risk factor for heart attack, stroke, congestive heart failure, dementia, kidney failure, and premature death. Long-term effects of excessive salt intake include stiffening of the arteries and thickening of heart muscle and organ damage. Recommendations include ways to reduce hypertension and the risk of heart disease.  Diseases of Our Time - Focusing on Diabetes Clinical staff conducted group or individual video education with verbal and written material and guidebook.  Patient learns why the best way to stop diseases of our time is prevention, through food and other lifestyle changes. Medicine (such as prescription pills and surgeries) is often only a Band-Aid on the problem, not a long-term solution. Most common diseases of our time include obesity, type 2 diabetes, hypertension, heart disease, and cancer. The Pritikin Program is recommended and has been proven to help reduce, reverse, and/or prevent the damaging effects of metabolic syndrome.  Nutrition   Overview of the Pritikin Eating Plan  Clinical staff conducted group or individual video education with verbal and written material and guidebook.  Patient learns about the [place] for disease risk reduction. The [place] emphasizes a wide variety of unrefined, minimally-processed carbohydrates, like fruits, vegetables, whole grains, and legumes. Go, Caution, and Stop food choices are explained. Plant-based and lean animal proteins are emphasized. Rationale provided for low sodium intake for blood pressure control, low added sugars for blood sugar stabilization, and low added fats and oils for coronary artery disease risk reduction and weight management.  Calorie Density  Clinical staff conducted group or individual video education with verbal and written material and guidebook.  Patient learns about calorie density and how it impacts the Pritikin Eating Plan. Knowing the characteristics of the food you choose will  help you decide whether those foods will lead to weight gain or weight loss, and whether you want to consume more or less of them. Weight loss is usually a side effect of the Pritikin Eating Plan because of its focus on low calorie-dense foods.  Label Reading  Clinical staff conducted group or individual video education with verbal and written material and guidebook.  Patient learns about the Pritikin recommended label reading guidelines and corresponding recommendations regarding calorie density, added sugars, sodium content, and whole grains.  Dining Out - Part 1  Clinical staff conducted group or individual video education with verbal and written material and guidebook.  Patient learns that restaurant meals can be sabotaging because they can be so high in calories, fat, sodium, and/or sugar. Patient learns recommended strategies on how to positively address this and avoid unhealthy pitfalls.  Facts on Fats  Clinical staff conducted group or individual video education with verbal and written material and guidebook.  Patient learns that lifestyle modifications can be just as effective, if not more so, as many medications for lowering your risk of heart disease. A Pritikin lifestyle can help to reduce your risk of inflammation and atherosclerosis (cholesterol build-up, or plaque, in the artery walls). Lifestyle interventions such as dietary choices and physical activity address the cause of atherosclerosis. A review of the types of fats and their impact on blood cholesterol levels, along with dietary recommendations to reduce fat intake is also included.  Nutrition Action Plan  Clinical staff conducted group or individual video education with verbal and written material and guidebook.  Patient learns how to incorporate Pritikin recommendations into their lifestyle. Recommendations include planning and keeping personal health goals in mind as an important part of their success.  Healthy Mind-Set     Healthy Minds, Bodies, Hearts  Clinical staff conducted group or individual video education with verbal and written material and guidebook.  Patient learns how to identify when they are stressed. Video will discuss the impact of that stress, as well as the many benefits of stress management. Patient will also be introduced to stress management techniques. The way we think, act, and feel has an impact on our hearts.  How Our Thoughts Can Heal Our Hearts  Clinical staff conducted group or individual video education with verbal and written material and guidebook.  Patient learns that negative thoughts can cause depression and anxiety. This can result in negative lifestyle behavior and serious health problems. Cognitive behavioral therapy is an effective method to help control our thoughts in order to change and improve our emotional outlook.  Additional Videos:  Exercise    Improving Performance  Clinical staff conducted group or individual video education with verbal and written material and guidebook.  Patient learns to use a non-linear approach by alternating intensity levels and lengths of time spent exercising to help burn more calories and lose more body fat. Cardiovascular exercise helps improve heart health, metabolism, hormonal balance, blood sugar control, and recovery from fatigue. Resistance training improves strength, endurance, balance, coordination, reaction time, metabolism, and muscle mass. Flexibility exercise improves circulation, posture, and balance. Seek guidance from your physician and exercise physiologist before implementing an exercise routine and learn your capabilities and proper form for all exercise.  Introduction to Yoga  Clinical staff conducted group or individual video education with verbal and written material and guidebook.  Patient learns about yoga, a discipline of the coming together of mind, breath, and body. The benefits of yoga include improved flexibility,  improved range of motion, better posture and core strength, increased lung function, weight loss, and positive self-image. Yoga's heart health benefits include lowered blood pressure, healthier heart rate, decreased cholesterol and triglyceride levels, improved immune function, and reduced stress. Seek guidance from your physician and exercise physiologist before implementing an exercise routine and learn your capabilities and proper form for all exercise.  Medical   Aging: Enhancing Your Quality of Life  Clinical staff conducted group or individual video education with verbal and written material and guidebook.  Patient learns key strategies and recommendations to stay  in good physical health and enhance quality of life, such as prevention strategies, having an advocate, securing a Health Care Proxy and Power of Attorney, and keeping a list of medications and system for tracking them. It also discusses how to avoid risk for bone loss.  Biology of Weight Control  Clinical staff conducted group or individual video education with verbal and written material and guidebook.  Patient learns that weight gain occurs because we consume more calories than we burn (eating more, moving less). Even if your body weight is normal, you may have higher ratios of fat compared to muscle mass. Too much body fat puts you at increased risk for cardiovascular disease, heart attack, stroke, type 2 diabetes, and obesity-related cancers. In addition to exercise, following the [place] can help reduce your risk.  Decoding Lab Results  Clinical staff conducted group or individual video education with verbal and written material and guidebook.  Patient learns that lab test reflects one measurement whose values change over time and are influenced by many factors, including medication, stress, sleep, exercise, food, hydration, pre-existing medical conditions, and more. It is recommended to use the knowledge from this  video to become more involved with your lab results and evaluate your numbers to speak with your doctor.   Diseases of Our Time - Overview  Clinical staff conducted group or individual video education with verbal and written material and guidebook.  Patient learns that according to the CDC, 50% to 70% of chronic diseases (such as obesity, type 2 diabetes, elevated lipids, hypertension, and heart disease) are avoidable through lifestyle improvements including healthier food choices, listening to satiety cues, and increased physical activity.  Sleep Disorders Clinical staff conducted group or individual video education with verbal and written material and guidebook.  Patient learns how good quality and duration of sleep are important to overall health and well-being. Patient also learns about sleep disorders and how they impact health along with recommendations to address them, including discussing with a physician.  Nutrition  Dining Out - Part 2 Clinical staff conducted group or individual video education with verbal and written material and guidebook.  Patient learns how to plan ahead and communicate in order to maximize their dining experience in a healthy and nutritious manner. Included are recommended food choices based on the type of restaurant the patient is visiting.   Fueling a Best boy conducted group or individual video education with verbal and written material and guidebook.  There is a strong connection between our food choices and our health. Diseases like obesity and type 2 diabetes are very prevalent and are in large-part due to lifestyle choices. The Pritikin Eating Plan provides plenty of food and hunger-curbing satisfaction. It is easy to follow, affordable, and helps reduce health risks.  Menu Workshop  Clinical staff conducted group or individual video education with verbal and written material and guidebook.  Patient learns that restaurant meals can  sabotage health goals because they are often packed with calories, fat, sodium, and sugar. Recommendations include strategies to plan ahead and to communicate with the manager, chef, or server to help order a healthier meal.  Planning Your Eating Strategy  Clinical staff conducted group or individual video education with verbal and written material and guidebook.  Patient learns about the [place] and its benefit of reducing the risk of disease. The [place] does not focus on calories. Instead, it emphasizes high-quality, nutrient-rich foods. By knowing the characteristics of the foods,  we choose, we can determine their calorie density and make informed decisions.  Targeting Your Nutrition Priorities  Clinical staff conducted group or individual video education with verbal and written material and guidebook.  Patient learns that lifestyle habits have a tremendous impact on disease risk and progression. This video provides eating and physical activity recommendations based on your personal health goals, such as reducing LDL cholesterol, losing weight, preventing or controlling type 2 diabetes, and reducing high blood pressure.  Vitamins and Minerals  Clinical staff conducted group or individual video education with verbal and written material and guidebook.  Patient learns different ways to obtain key vitamins and minerals, including through a recommended healthy diet. It is important to discuss all supplements you take with your doctor.   Healthy Mind-Set    Smoking Cessation  Clinical staff conducted group or individual video education with verbal and written material and guidebook.  Patient learns that cigarette smoking and tobacco addiction pose a serious health risk which affects millions of people. Stopping smoking will significantly reduce the risk of heart disease, lung disease, and many forms of cancer. Recommended strategies for quitting are covered, including  working with your doctor to develop a successful plan.  Culinary   Becoming a Financial trader conducted group or individual video education with verbal and written material and guidebook.  Patient learns that cooking at home can be healthy, cost-effective, quick, and puts them in control. Keys to cooking healthy recipes will include looking at your recipe, assessing your equipment needs, planning ahead, making it simple, choosing cost-effective seasonal ingredients, and limiting the use of added fats, salts, and sugars.  Cooking - Breakfast and Snacks  Clinical staff conducted group or individual video education with verbal and written material and guidebook.  Patient learns how important breakfast is to satiety and nutrition through the entire day. Recommendations include key foods to eat during breakfast to help stabilize blood sugar levels and to prevent overeating at meals later in the day. Planning ahead is also a key component.  Cooking - Human resources officer conducted group or individual video education with verbal and written material and guidebook.  Patient learns eating strategies to improve overall health, including an approach to cook more at home. Recommendations include thinking of animal protein as a side on your plate rather than center stage and focusing instead on lower calorie dense options like vegetables, fruits, whole grains, and plant-based proteins, such as beans. Making sauces in large quantities to freeze for later and leaving the skin on your vegetables are also recommended to maximize your experience.  Cooking - Healthy Salads and Dressing Clinical staff conducted group or individual video education with verbal and written material and guidebook.  Patient learns that vegetables, fruits, whole grains, and legumes are the foundations of the [place]. Recommendations include how to incorporate each of these in flavorful and healthy  salads, and how to create homemade salad dressings. Proper handling of ingredients is also covered. Cooking - Soups and [employer] - Soups and Desserts Clinical staff conducted group or individual video education with verbal and written material and guidebook.  Patient learns that Pritikin soups and desserts make for easy, nutritious, and delicious snacks and meal components that are low in sodium, fat, sugar, and calorie density, while high in vitamins, minerals, and filling fiber. Recommendations include simple and healthy ideas for soups and desserts.   Overview     The Pritikin Solution Program Overview Clinical  staff conducted group or individual video education with verbal and written material and guidebook.  Patient learns that the results of the [place] Program have been documented in more than 100 articles published in peer-reviewed journals, and the benefits include reducing risk factors for (and, in some cases, even reversing) high cholesterol, high blood pressure, type 2 diabetes, obesity, and more! An overview of the three key pillars of the Pritikin Program will be covered: eating well, doing regular exercise, and having a healthy mind-set.  WORKSHOPS  Exercise: Exercise Basics: Building Your Action Plan Clinical staff led group instruction and group discussion with PowerPoint presentation and patient guidebook. To enhance the learning environment the use of posters, models and videos may be added. At the conclusion of this workshop, patients will comprehend the difference between physical activity and exercise, as well as the benefits of incorporating both, into their routine. Patients will understand the FITT (Frequency, Intensity, Time, and Type) principle and how to use it to build an exercise action plan. In addition, safety concerns and other considerations for exercise and cardiac rehab will be addressed by the presenter. The purpose of this lesson is to promote a  comprehensive and effective weekly exercise routine in order to improve patients' overall level of fitness.   Managing Heart Disease: Your Path to a Healthier Heart Clinical staff led group instruction and group discussion with PowerPoint presentation and patient guidebook. To enhance the learning environment the use of posters, models and videos may be added.At the conclusion of this workshop, patients will understand the anatomy and physiology of the heart. Additionally, they will understand how Pritikin's three pillars impact the risk factors, the progression, and the management of heart disease.  The purpose of this lesson is to provide a high-level overview of the heart, heart disease, and how the Pritikin lifestyle positively impacts risk factors.  Exercise Biomechanics Clinical staff led group instruction and group discussion with PowerPoint presentation and patient guidebook. To enhance the learning environment the use of posters, models and videos may be added. Patients will learn how the structural parts of their bodies function and how these functions impact their daily activities, movement, and exercise. Patients will learn how to promote a neutral spine, learn how to manage pain, and identify ways to improve their physical movement in order to promote healthy living. The purpose of this lesson is to expose patients to common physical limitations that impact physical activity. Participants will learn practical ways to adapt and manage aches and pains, and to minimize their effect on regular exercise. Patients will learn how to maintain good posture while sitting, walking, and lifting.  Balance Training and Fall Prevention  Clinical staff led group instruction and group discussion with PowerPoint presentation and patient guidebook. To enhance the learning environment the use of posters, models and videos may be added. At the conclusion of this workshop, patients will understand the  importance of their sensorimotor skills (vision, proprioception, and the vestibular system) in maintaining their ability to balance as they age. Patients will apply a variety of balancing exercises that are appropriate for their current level of function. Patients will understand the common causes for poor balance, possible solutions to these problems, and ways to modify their physical environment in order to minimize their fall risk. The purpose of this lesson is to teach patients about the importance of maintaining balance as they age and ways to minimize their risk of falling.  WORKSHOPS   Nutrition:  Fueling a Scientist, research (physical sciences) led  group instruction and group discussion with PowerPoint presentation and patient guidebook. To enhance the learning environment the use of posters, models and videos may be added. Patients will review the foundational principles of the [place] and understand what constitutes a serving size in each of the food groups. Patients will also learn Pritikin-friendly foods that are better choices when away from home and review make-ahead meal and snack options. Calorie density will be reviewed and applied to three nutrition priorities: weight maintenance, weight loss, and weight gain. The purpose of this lesson is to reinforce (in a group setting) the key concepts around what patients are recommended to eat and how to apply these guidelines when away from home by planning and selecting Pritikin-friendly options. Patients will understand how calorie density may be adjusted for different weight management goals.  Mindful Eating  Clinical staff led group instruction and group discussion with PowerPoint presentation and patient guidebook. To enhance the learning environment the use of posters, models and videos may be added. Patients will briefly review the concepts of the [place] and the importance of low-calorie dense foods. The concept of mindful  eating will be introduced as well as the importance of paying attention to internal hunger signals. Triggers for non-hunger eating and techniques for dealing with triggers will be explored. The purpose of this lesson is to provide patients with the opportunity to review the basic principles of the [place], discuss the value of eating mindfully and how to measure internal cues of hunger and fullness using the Hunger Scale. Patients will also discuss reasons for non-hunger eating and learn strategies to use for controlling emotional eating.  Targeting Your Nutrition Priorities Clinical staff led group instruction and group discussion with PowerPoint presentation and patient guidebook. To enhance the learning environment the use of posters, models and videos may be added. Patients will learn how to determine their genetic susceptibility to disease by reviewing their family history. Patients will gain insight into the importance of diet as part of an overall healthy lifestyle in mitigating the impact of genetics and other environmental insults. The purpose of this lesson is to provide patients with the opportunity to assess their personal nutrition priorities by looking at their family history, their own health history and current risk factors. Patients will also be able to discuss ways of prioritizing and modifying the [place] for their highest risk areas  Menu  Clinical staff led group instruction and group discussion with PowerPoint presentation and patient guidebook. To enhance the learning environment the use of posters, models and videos may be added. Using menus brought in from [employer], or printed from [employer], patients will apply the [place] dining out guidelines that were presented in the [employer] video. Patients will also be able to practice these guidelines in a variety of provided scenarios. The purpose of this lesson is to provide  patients with the opportunity to practice hands-on learning of the [place] with actual menus and practice scenarios.  Label Reading Clinical staff led group instruction and group discussion with PowerPoint presentation and patient guidebook. To enhance the learning environment the use of posters, models and videos may be added. Patients will review and discuss the Pritikin label reading guidelines presented in Pritikin's Label Reading Educational series video. Using fool labels brought in from local grocery stores and markets, patients will apply the label reading guidelines and determine if the packaged food meet the Pritikin guidelines. The purpose  of this lesson is to provide patients with the opportunity to review, discuss, and practice hands-on learning of the Pritikin Label Reading guidelines with actual packaged food labels. [place] Workshops are designed to teach patients ways to prepare quick, simple, and affordable recipes at home. The importance of nutrition's role in chronic disease risk reduction is reflected in its emphasis in the overall Pritikin program. By learning how to prepare essential core Pritikin Eating Plan recipes, patients will increase control over what they eat; be able to customize the flavor of foods without the use of added salt, sugar, or fat; and improve the quality of the food they consume. By learning a set of core recipes which are easily assembled, quickly prepared, and affordable, patients are more likely to prepare more healthy foods at home. These workshops focus on convenient breakfasts, simple entres, side dishes, and desserts which can be prepared with minimal effort and are consistent with nutrition recommendations for cardiovascular risk reduction. Cooking [employer] are taught by a Engineer, materials (RD) who has been trained by the [employer]. The chef or RD has a clear  understanding of the importance of minimizing - if not completely eliminating - added fat, sugar, and sodium in recipes. Throughout the series of [place] Workshop sessions, patients will learn about healthy ingredients and efficient methods of cooking to build confidence in their capability to prepare    Cooking School weekly topics:  Adding Flavor- Sodium-Free  Fast and Healthy Breakfasts  Powerhouse Plant-Based Proteins  Satisfying Salads and Dressings  Simple Sides and Sauces  International Cuisine-Spotlight on the [place] Zones  Delicious Desserts  Savory Soups  Efficiency Cooking - Meals in a Snap  Tasty Appetizers and Snacks  Comforting Weekend Breakfasts  One-Pot Wonders   Fast Evening Meals  Easy [place] (Psychosocial): New Thoughts, New Behaviors Clinical staff led group instruction and group discussion with PowerPoint presentation and patient guidebook. To enhance the learning environment the use of posters, models and videos may be added. Patients will learn and practice techniques for developing effective health and lifestyle goals. Patients will be able to effectively apply the goal setting process learned to develop at least one new personal goal.  The purpose of this lesson is to expose patients to a new skill set of behavior modification techniques such as techniques setting SMART goals, overcoming barriers, and achieving new thoughts and new behaviors.  Managing Moods and Relationships Clinical staff led group instruction and group discussion with PowerPoint presentation and patient guidebook. To enhance the learning environment the use of posters, models and videos may be added. Patients will learn how emotional and chronic stress factors can impact their health and relationships. They will learn healthy ways to manage their moods and utilize positive coping mechanisms. In addition, ICR patients  will learn ways to improve communication skills. The purpose of this lesson is to expose patients to ways of understanding how one's mood and health are intimately connected. Developing a healthy outlook can help build positive relationships and connections with others. Patients will understand the importance of utilizing effective communication skills that include actively listening and being heard. They will learn and understand the importance of the "4 Cs" and especially Connections in fostering of a Healthy Mind-Set.  Healthy Sleep for a Healthy Heart Clinical staff led group instruction and group discussion with PowerPoint presentation and patient guidebook. To enhance the learning  environment the use of posters, models and videos may be added. At the conclusion of this workshop, patients will be able to demonstrate knowledge of the importance of sleep to overall health, well-being, and quality of life. They will understand the symptoms of, and treatments for, common sleep disorders. Patients will also be able to identify daytime and nighttime behaviors which impact sleep, and they will be able to apply these tools to help manage sleep-related challenges. The purpose of this lesson is to provide patients with a general overview of sleep and outline the importance of quality sleep. Patients will learn about a few of the most common sleep disorders. Patients will also be introduced to the concept of "sleep hygiene," and discover ways to self-manage certain sleeping problems through simple daily behavior changes. Finally, the workshop will motivate patients by clarifying the links between quality sleep and their goals of heart-healthy living.   Recognizing and Reducing Stress Clinical staff led group instruction and group discussion with PowerPoint presentation and patient guidebook. To enhance the learning environment the use of posters, models and videos may be added. At the conclusion of this workshop,  patients will be able to understand the types of stress reactions, differentiate between acute and chronic stress, and recognize the impact that chronic stress has on their health. They will also be able to apply different coping mechanisms, such as reframing negative self-talk. Patients will have the opportunity to practice a variety of stress management techniques, such as deep abdominal breathing, progressive muscle relaxation, and/or guided imagery.  The purpose of this lesson is to educate patients on the role of stress in their lives and to provide healthy techniques for coping with it.  Learning Barriers/Preferences:  Learning Barriers/Preferences - 10/11/22 0956       Learning Barriers/Preferences   Learning Barriers Sight;Hearing   wears glasses, HOH both ears   Learning Preferences Verbal Instruction;Written Material;Computer/Internet             Education Topics:  Knowledge Questionnaire Score:  Knowledge Questionnaire Score - 10/11/22 0958       Knowledge Questionnaire Score   Pre Score 19/24             Core Components/Risk Factors/Patient Goals at Admission:  Personal Goals and Risk Factors at Admission - 10/11/22 0958       Core Components/Risk Factors/Patient Goals on Admission    Weight Management Yes;Obesity;Weight Loss    Intervention Weight Management: Develop a combined nutrition and exercise program designed to reach desired caloric intake, while maintaining appropriate intake of nutrient and fiber, sodium and fats, and appropriate energy expenditure required for the weight goal.;Weight Management: Provide education and appropriate resources to help participant work on and attain dietary goals.;Weight Management/Obesity: Establish reasonable short term and long term weight goals.;Obesity: Provide education and appropriate resources to help participant work on and attain dietary goals.    Admit Weight 210 lb 15.7 oz (95.7 kg)    Goal Weight: Long Term 190  lb (86.2 kg)   Pt goal   Expected Outcomes Short Term: Continue to assess and modify interventions until short term weight is achieved;Long Term: Adherence to nutrition and physical activity/exercise program aimed toward attainment of established weight goal;Weight Maintenance: Understanding of the daily nutrition guidelines, which includes 25-35% calories from fat, 7% or less cal from saturated fats, less than '200mg'$  cholesterol, less than 1.5gm of sodium, & 5 or more servings of fruits and vegetables daily;Weight Loss: Understanding of general recommendations for a balanced deficit meal  plan, which promotes 1-2 lb weight loss per week and includes a negative energy balance of [phone] kcal/d;Understanding recommendations for meals to include 15-35% energy as protein, 25-35% energy from fat, 35-60% energy from carbohydrates, less than '200mg'$  of dietary cholesterol, 20-35 gm of total fiber daily;Understanding of distribution of calorie intake throughout the day with the consumption of 4-5 meals/snacks    Hypertension Yes    Intervention Provide education on lifestyle modifcations including regular physical activity/exercise, weight management, moderate sodium restriction and increased consumption of fresh fruit, vegetables, and low fat dairy, alcohol moderation, and smoking cessation.;Monitor prescription use compliance.    Expected Outcomes Short Term: Continued assessment and intervention until BP is < 140/52m HG in hypertensive participants. < 130/892mHG in hypertensive participants with diabetes, heart failure or chronic kidney disease.;Long Term: Maintenance of blood pressure at goal levels.    Lipids Yes    Intervention Provide education and support for participant on nutrition & aerobic/resistive exercise along with prescribed medications to achieve LDL '70mg'$ , HDL >'40mg'$ .    Expected Outcomes Short Term: Participant states understanding of desired cholesterol values and is compliant with medications  prescribed. Participant is following exercise prescription and nutrition guidelines.;Long Term: Cholesterol controlled with medications as prescribed, with individualized exercise RX and with personalized nutrition plan. Value goals: LDL < '70mg'$ , HDL > 40 mg.    Stress Yes    Intervention Offer individual and/or small group education and counseling on adjustment to heart disease, stress management and health-related lifestyle change. Teach and support self-help strategies.;Refer participants experiencing significant psychosocial distress to appropriate mental health specialists for further evaluation and treatment. When possible, include family members and significant others in education/counseling sessions.    Expected Outcomes Short Term: Participant demonstrates changes in health-related behavior, relaxation and other stress management skills, ability to obtain effective social support, and compliance with psychotropic medications if prescribed.;Long Term: Emotional wellbeing is indicated by absence of clinically significant psychosocial distress or social isolation.             Core Components/Risk Factors/Patient Goals Review:    Core Components/Risk Factors/Patient Goals at Discharge (Final Review):    ITP Comments:  ITP Comments     Row Name 10/11/22 0848 10/17/22 1447         ITP Comments Dr TrFransico Himd, Medical Director, Introduction to [facility] Education program, Intensive Cardiac Rehab. Initial orientation packet reviewed with the patient 30 Day ITP Review. GaNikodemlans to begin exercise on 10/19/22               Comments: See ITP comments.MaHarrell GaveN BSN

## 2022-10-17 NOTE — Telephone Encounter (Signed)
Spoke with Mr Radi he hopes to start exercise on Monday and know to be symptom free for 48 hours(from GI symptoms.Harrell Gave RN BSN

## 2022-10-19 ENCOUNTER — Encounter (HOSPITAL_COMMUNITY)
Admission: RE | Admit: 2022-10-19 | Discharge: 2022-10-19 | Disposition: A | Discharge status: Home / Self Care | Payer: Medicare Other | Source: Ambulatory Visit | Attending: Cardiovascular Disease | Admitting: Cardiovascular Disease

## 2022-10-19 DIAGNOSIS — Z952 Presence of prosthetic heart valve: Secondary | ICD-10-CM | POA: Insufficient documentation

## 2022-10-19 NOTE — Progress Notes (Signed)
Incomplete Session Note  Patient Details  Name: Patrick Franco MRN: [id number] Date of Birth: 24-Jun-1932 Referring Provider:   Flowsheet Row INTENSIVE CARDIAC REHAB ORIENT from 10/11/2022 in [facility] for Heart, Vascular, & [place]  Referring Provider Patrick Chandler, MD       Patrick Franco did not complete his rehab session.  Patrick Franco did not exercise today patient had to leave to meet his choral group.QUALITY OF LIFE SCORE REVIEW  Pt completed Quality of Life survey as a participant in Cardiac Rehab.  Scores 21.0 or below are considered low.  Pt score very low in several areas Overall 22.70, Health and Function 19.83, socioeconomic 28.33, physiological and spiritual 20.93, family 27.0. Patient quality of life slightly altered by physical constraints which limits ability to perform as prior to recent cardiac illness.Patrick Franco says that he is dissatisfied with his health due to his recent valve surgery and having Covid. Patrick Franco denies being depressed currently. Patrick Franco has anxiety and is taking medication for management. Offered emotional support and reassurance.  Will continue to monitor and intervene as necessary. Patrick Gave RN BSN

## 2022-10-21 ENCOUNTER — Encounter (HOSPITAL_COMMUNITY)
Admission: RE | Admit: 2022-10-21 | Discharge: 2022-10-21 | Disposition: A | Discharge status: Home / Self Care | Payer: Medicare Other | Source: Ambulatory Visit | Attending: Cardiovascular Disease | Admitting: Cardiovascular Disease

## 2022-10-21 DIAGNOSIS — Z952 Presence of prosthetic heart valve: Secondary | ICD-10-CM | POA: Diagnosis not present

## 2022-10-21 NOTE — Progress Notes (Signed)
Daily Session Note  Patient Details  Name: Patrick Franco MRN: [id number] Date of Birth: 05/24/1932 Referring Provider:   Flowsheet Row INTENSIVE CARDIAC REHAB ORIENT from 10/11/2022 in [facility] for Heart, Vascular, & [place]  Referring Provider [name], MD       Encounter Date: 10/21/2022  Check In:  Session Check In - 10/21/22 0934       Check-In   Supervising physician immediately available to respond to emergencies [facility] - Physician supervision    Physician(s) Dr. Phineas Inches    Location [facility]    Staff Present Lesly Rubenstein, MS, ACSM-CEP, CCRP, Exercise Physiologist;Bailey Pearline Cables, MS, Exercise Physiologist;Olinty Celesta Aver, MS, ACSM-CEP, Exercise Physiologist;Jetta Gilford Rile BS, ACSM-CEP, Exercise Physiologist; Wilber Oliphant, RN, BSN    Virtual Visit No    Medication changes reported     No    Fall or balance concerns reported    No    Tobacco Cessation No Change    Current number of cigarettes/nicotine per day     0    Warm-up and Cool-down Performed as group-led instruction    Resistance Training Performed No    VAD Patient? No    PAD/SET Patient? No      Pain Assessment   Currently in Pain? No/denies    Pain Score 0-No pain    Multiple Pain Sites No             Capillary Blood Glucose: No results found for this or any previous visit (from the past 24 hour(s)).    Social History   Tobacco Use  Smoking Status Former   Packs/day: 0.50   Types: Cigarettes   Start date: 106   Quit date: 1961   Years since quitting: 62.9  Smokeless Tobacco Never  Tobacco Comments   Pt smoked for 30month when in [employer]    Goals Met:  Exercise tolerated well No report of concerns or symptoms today Strength training completed today  Goals Unmet:  Not Applicable  Comments: Pt started Intensive cardiac rehab today.  Pt tolerated light exercise without difficulty. VSS, telemetry-afib, asymptomatic.  Medication  list reconciled. Pt denies barriers to medication compliance.  PSYCHOSOCIAL ASSESSMENT:  PHQ-0. Pt exhibits positive coping skills, hopeful outlook with supportive family. No psychosocial needs identified at this time, no psychosocial interventions necessary.   Pt oriented to exercise equipment and routine. Understanding verbalized. CMaurice SmallRN, BSN Cardiac and Pulmonary Rehab Nurse Navigator       Dr. TFransico Himis [facility]

## 2022-10-24 ENCOUNTER — Encounter (HOSPITAL_COMMUNITY)
Admission: RE | Admit: 2022-10-24 | Discharge: 2022-10-24 | Disposition: A | Discharge status: Home / Self Care | Payer: Medicare Other | Source: Ambulatory Visit | Attending: Cardiovascular Disease | Admitting: Cardiovascular Disease

## 2022-10-24 DIAGNOSIS — Z952 Presence of prosthetic heart valve: Secondary | ICD-10-CM | POA: Diagnosis not present

## 2022-10-26 ENCOUNTER — Encounter (HOSPITAL_COMMUNITY)
Admission: RE | Admit: 2022-10-26 | Discharge: 2022-10-26 | Disposition: A | Discharge status: Home / Self Care | Payer: Medicare Other | Source: Ambulatory Visit | Attending: Cardiovascular Disease | Admitting: Cardiovascular Disease

## 2022-10-26 DIAGNOSIS — Z952 Presence of prosthetic heart valve: Secondary | ICD-10-CM | POA: Diagnosis not present

## 2022-10-28 ENCOUNTER — Encounter (HOSPITAL_COMMUNITY)
Admission: RE | Admit: 2022-10-28 | Discharge: 2022-10-28 | Disposition: A | Discharge status: Home / Self Care | Payer: Medicare Other | Source: Ambulatory Visit | Attending: Cardiovascular Disease | Admitting: Cardiovascular Disease

## 2022-10-28 DIAGNOSIS — Z952 Presence of prosthetic heart valve: Secondary | ICD-10-CM | POA: Diagnosis not present

## 2022-10-31 ENCOUNTER — Encounter (HOSPITAL_COMMUNITY)
Admission: RE | Admit: 2022-10-31 | Discharge: 2022-10-31 | Disposition: A | Discharge status: Home / Self Care | Payer: Medicare Other | Source: Ambulatory Visit | Attending: Cardiovascular Disease | Admitting: Cardiovascular Disease

## 2022-10-31 DIAGNOSIS — Z952 Presence of prosthetic heart valve: Secondary | ICD-10-CM | POA: Diagnosis not present

## 2022-11-02 ENCOUNTER — Encounter (HOSPITAL_COMMUNITY)
Admission: RE | Admit: 2022-11-02 | Discharge: 2022-11-02 | Disposition: A | Discharge status: Home / Self Care | Payer: Medicare Other | Source: Ambulatory Visit | Attending: Cardiovascular Disease | Admitting: Cardiovascular Disease

## 2022-11-02 DIAGNOSIS — Z952 Presence of prosthetic heart valve: Secondary | ICD-10-CM | POA: Diagnosis not present

## 2022-11-04 ENCOUNTER — Encounter (HOSPITAL_COMMUNITY): Payer: Medicare Other

## 2022-11-09 ENCOUNTER — Encounter (HOSPITAL_COMMUNITY)
Admission: RE | Admit: 2022-11-09 | Discharge: 2022-11-09 | Disposition: A | Discharge status: Home / Self Care | Payer: Medicare Other | Source: Ambulatory Visit | Attending: Cardiovascular Disease | Admitting: Cardiovascular Disease

## 2022-11-09 DIAGNOSIS — Z952 Presence of prosthetic heart valve: Secondary | ICD-10-CM | POA: Diagnosis not present

## 2022-11-11 ENCOUNTER — Encounter (HOSPITAL_COMMUNITY): Payer: Medicare Other

## 2022-11-15 NOTE — Progress Notes (Signed)
Cardiac Individual Treatment Plan  Patient Details  Name: Patrick Franco MRN: [id number] Date of Birth: [birth year] Referring Provider:   Flowsheet Row INTENSIVE CARDIAC REHAB ORIENT from 10/11/2022 in [facility] for Heart, Vascular, & [place]  Referring Provider [name], MD       Initial Encounter Date:  [place] from 10/11/2022 in [facility] for Heart, Vascular, & Lung Health  Date 10/11/22       Visit Diagnosis: 07/26/22 TAVR  Patient's Home Medications on Admission:  Current Outpatient Medications:    acetaminophen (TYLENOL) 500 MG tablet, Take 1,000 mg by mouth every 6 (six) hours as needed for moderate pain., Disp: , Rfl:    allopurinol (ZYLOPRIM) 100 MG tablet, Take 2 tablets (200 mg total) by mouth daily., Disp: , Rfl:    amiodarone (PACERONE) 200 MG tablet, Take 1 tablet (200 mg total) by mouth daily., Disp: 90 tablet, Rfl: 1   amoxicillin (AMOXIL) 500 MG tablet, Take 4 tablets (2,000 mg total) by mouth as directed. 1 hour prior to dental work including cleanings, Disp: 12 tablet, Rfl: 12   apixaban (ELIQUIS) 5 MG TABS tablet, Take 1 tablet (5 mg total) by mouth 2 (two) times daily., Disp: 180 tablet, Rfl: 2   atorvastatin (LIPITOR) 10 MG tablet, Take 10 mg by mouth every morning., Disp: , Rfl: 10   cyanocobalamin (VITAMIN B12) 1000 MCG tablet, Take 1,000 mcg by mouth daily., Disp: , Rfl:    diltiazem (CARDIZEM CD) 240 MG 24 hr capsule, TAKE (1) CAPSULE DAILY., Disp: 30 capsule, Rfl: 9   dutasteride (AVODART) 0.5 MG capsule, Take 0.5 mg by mouth every morning., Disp: , Rfl: 2   furosemide (LASIX) 40 MG tablet, Take 1.5 tablets (60 mg total) by mouth 4 (four) times a week., Disp: 30 tablet, Rfl: 11   metoprolol tartrate (LOPRESSOR) 25 MG tablet, Take 1 tablet (25 mg total) by mouth daily., Disp: 90 tablet, Rfl: 3   mirtazapine (REMERON) 30 MG tablet, TAKE ONE TABLET AT  BEDTIME., Disp: 90 tablet, Rfl: 0   Multiple Vitamin (MULTIVITAMIN) tablet, Take 1 tablet by mouth daily., Disp: , Rfl:    Omega-3 Fatty Acids (FISH OIL) 1200 MG CAPS, Take 1,200 mg by mouth daily., Disp: , Rfl:    pantoprazole (PROTONIX) 40 MG tablet, Take 1 tablet (40 mg total) by mouth daily., Disp: , Rfl:    Polyethyl Glycol-Propyl Glycol (SYSTANE) 0.4-0.3 % SOLN, Place 1 drop into both eyes 2 (two) times daily., Disp: , Rfl:    potassium chloride SA (KLOR-CON M) 20 MEQ tablet, TAKE 1 TABLET BY MOUTH IN THE MORNING, TAKE 1/2 TABLET BY MOUTH IN THE P.M., Disp: 135 tablet, Rfl: 3   Probiotic Product (PROBIOTIC DAILY PO), Take 1 tablet by mouth daily., Disp: , Rfl:    pyridOXINE (VITAMIN B-6) 100 MG tablet, Take 100 mg by mouth daily., Disp: , Rfl:    spironolactone (ALDACTONE) 25 MG tablet, Take 1 tablet (25 mg total) by mouth daily., Disp: 90 tablet, Rfl: 2   tamsulosin (FLOMAX) 0.4 MG CAPS capsule, Take 0.4 mg by mouth daily after breakfast., Disp: , Rfl: 2   Turmeric 500 MG CAPS, Take 500 mg by mouth daily., Disp: , Rfl:   Past Medical History: Past Medical History:  Diagnosis Date   Aortic stenosis    Arthritis    Atrial fibrillation (HCC)    BPH (benign prostatic hyperplasia)    Chronic low back  pain    Depression    Diverticulitis    Dyspnea    Elevated PSA    Foot drop, right    Gait disorder 02/03/2015   Hip pain    History of colonoscopy    History of nuclear stress test    Myoview 10/22: EF 67, no ischemia or infarction; low risk   HOH (hard of hearing)    hearing aid   Hypertension    Lumbar radiculopathy    Mitral valve prolapse    Onychomycosis    Peptic ulcer disease    S/P TAVR (transcatheter aortic valve replacement) 07/26/2022   60m S3UR via TF approach with Dr. MAngelena Formand Dr. BRandolm Idol  Sepsis ([facility] 2005   e. coli-after prostate bx   Sleep apnea    Torn ACL    Tracheobronchomalacia    seen on 2019 Chest CT    Tobacco Use: Social History    Tobacco Use  Smoking Status Former   Packs/day: 0.50   Types: Cigarettes   Start date: 172  Quit date: 1961   Years since quitting: 63.0  Smokeless Tobacco Never  Tobacco Comments   Pt smoked for 659monthwhen in [employer]    Labs: Review Flowsheet       Latest Ref Rng & Units 03/06/2020 05/26/2022 07/26/2022  Labs for ITP Cardiac and Pulmonary Rehab  Cholestrol 0 - 200 mg/dL 96  - -  LDL (calc) 0 - 99 mg/dL 38  - -  HDL-C >40 mg/dL 48  - -  Trlycerides <150 mg/dL 50  - -  PH, Arterial 7.35 - 7.45 - 7.432  -  PCO2 arterial 32 - 48 mmHg - 34.5  -  Bicarbonate 20.0 - 28.0 mmol/L - 23.0  -  TCO2 22 - 32 mmol/L - '24  20  15  23  23   '$ Acid-base deficit 0.0 - 2.0 mmol/L - 1.0  -  O2 Saturation % - 97  -    Capillary Blood Glucose: Lab Results  Component Value Date   GLUCAP 172 (H) 07/27/2022   GLUCAP 94 07/26/2022     Exercise Target Goals: Exercise Program Goal: Individual exercise prescription set using results from initial 6 min walk test and THRR while considering  patient's activity barriers and safety.   Exercise Prescription Goal: Initial exercise prescription builds to 30-45 minutes a day of aerobic activity, 2-3 days per week.  Home exercise guidelines will be given to patient during program as part of exercise prescription that the participant will acknowledge.  Activity Barriers & Risk Stratification:  Activity Barriers & Cardiac Risk Stratification - 10/11/22 1001       Activity Barriers & Cardiac Risk Stratification   Activity Barriers Arthritis;Back Problems;Neck/Spine Problems;Joint Problems;Deconditioning;Muscular Weakness;Balance Concerns;History of Falls    Cardiac Risk Stratification High             6 Minute Walk:  6 Minute Walk     Row Name 10/11/22 1000         6 Minute Walk   Phase Initial     Distance 1005 feet  Used GOCART for balance concerns     Walk Time 6 minutes     # of Rest Breaks 0     MPH 1.9     METS 1.42     RPE 11      Perceived Dyspnea  0     VO2 Peak 5     Symptoms No     Resting  HR 65 bpm     Resting BP 108/74     Resting Oxygen Saturation  96 %     Exercise Oxygen Saturation  during 6 min walk 93 %     Max Ex. HR 101 bpm     Max Ex. BP 152/72     2 Minute Post BP 110/70              Oxygen Initial Assessment:   Oxygen Re-Evaluation:   Oxygen Discharge (Final Oxygen Re-Evaluation):   Initial Exercise Prescription:  Initial Exercise Prescription - 10/11/22 1000       Date of Initial Exercise RX and Referring Provider   Date 10/11/22    Referring Provider [name], MD    Expected Discharge Date 12/09/22      NuStep   Level 1    SPM 75    Minutes 30    METs 1.5      Prescription Details   Frequency (times per week) 3    Duration Progress to 30 minutes of continuous aerobic without signs/symptoms of physical distress      Intensity   THRR 40-80% of Max Heartrate 53-106    Ratings of Perceived Exertion 11-13    Perceived Dyspnea 0-4      Progression   Progression Continue progressive overload as per policy without signs/symptoms or physical distress.      Resistance Training   Training Prescription Yes    Weight 3 lbs    Reps 10-15             Perform Capillary Blood Glucose checks as needed.  Exercise Prescription Changes:   Exercise Prescription Changes     Row Name 10/21/22 1200 11/02/22 1100           Response to Exercise   Blood Pressure (Admit) 130/66 136/78      Blood Pressure (Exercise) 148/72 142/72      Blood Pressure (Exit) 110/70 100/62      Heart Rate (Admit) 62 bpm 71 bpm      Heart Rate (Exercise) 98 bpm 105 bpm      Heart Rate (Exit) 69 bpm 76 bpm      Rating of Perceived Exertion (Exercise) 10 12      Symptoms None None      Comments Pt's first day in the CRP2 program Reviewed METs, goals and Home exercise Rx      Duration Continue with 30 min of aerobic exercise without signs/symptoms of physical distress. Continue  with 30 min of aerobic exercise without signs/symptoms of physical distress.      Intensity THRR unchanged THRR unchanged        Progression   Progression Continue to progress workloads to maintain intensity without signs/symptoms of physical distress. Continue to progress workloads to maintain intensity without signs/symptoms of physical distress.      Average METs 2.5 3.4        Resistance Training   Training Prescription Yes No      Weight 3 lbs No weigts on Wednesdays      Reps 10-15 --      Time 10 Minutes --        Interval Training   Interval Training No No        NuStep   Level 1 2      SPM 95 112      Minutes 30 30      METs 2.5 3.4  Home Exercise Plan   Plans to continue exercise at -- Home (comment)      Frequency -- Add 2 additional days to program exercise sessions.      Initial Home Exercises Provided -- 11/02/22               Exercise Comments:   Exercise Comments     Row Name 10/21/22 1200 11/02/22 1159         Exercise Comments Pt's first day in the CRP2 program. Pt tolerated session well with no complaints. Reviewed METs, home exerercise Rx, and goals. Pt verbalized understanding of the home exercise RX and was provided a copy.               Exercise Goals and Review:   Exercise Goals     Row Name 10/11/22 1002             Exercise Goals   Increase Physical Activity Yes       Intervention Provide advice, education, support and counseling about physical activity/exercise needs.;Develop an individualized exercise prescription for aerobic and resistive training based on initial evaluation findings, risk stratification, comorbidities and participant's personal goals.       Expected Outcomes Short Term: Attend rehab on a regular basis to increase amount of physical activity.;Long Term: Add in home exercise to make exercise part of routine and to increase amount of physical activity.;Long Term: Exercising regularly at least 3-5 days a  week.       Increase Strength and Stamina Yes       Intervention Develop an individualized exercise prescription for aerobic and resistive training based on initial evaluation findings, risk stratification, comorbidities and participant's personal goals.;Provide advice, education, support and counseling about physical activity/exercise needs.       Expected Outcomes Short Term: Increase workloads from initial exercise prescription for resistance, speed, and METs.;Short Term: Perform resistance training exercises routinely during rehab and add in resistance training at home;Long Term: Improve cardiorespiratory fitness, muscular endurance and strength as measured by increased METs and functional capacity (6MWT)       Able to understand and use rate of perceived exertion (RPE) scale Yes       Intervention Provide education and explanation on how to use RPE scale       Expected Outcomes Short Term: Able to use RPE daily in rehab to express subjective intensity level;Long Term:  Able to use RPE to guide intensity level when exercising independently       Knowledge and understanding of Target Heart Rate Range (THRR) Yes       Intervention Provide education and explanation of THRR including how the numbers were predicted and where they are located for reference       Expected Outcomes Short Term: Able to state/look up THRR;Short Term: Able to use daily as guideline for intensity in rehab;Long Term: Able to use THRR to govern intensity when exercising independently       Understanding of Exercise Prescription Yes       Intervention Provide education, explanation, and written materials on patient's individual exercise prescription       Expected Outcomes Short Term: Able to explain program exercise prescription;Long Term: Able to explain home exercise prescription to exercise independently                Exercise Goals Re-Evaluation :  Exercise Goals Re-Evaluation     [place] Name 10/21/22 1200 11/02/22  1153  Exercise Goal Re-Evaluation   Exercise Goals Review Increase Physical Activity;Increase Strength and Stamina;Able to understand and use rate of perceived exertion (RPE) scale;Knowledge and understanding of Target Heart Rate Range (THRR);Understanding of Exercise Prescription Increase Physical Activity;Increase Strength and Stamina;Able to understand and use rate of perceived exertion (RPE) scale;Knowledge and understanding of Target Heart Rate Range (THRR);Understanding of Exercise Prescription      Comments Pt's first day in the CRP2 program. Pt understands the exercise Rx, THRR, and RPE scale. Reviewed METs, goals, and home exercise Rx. Pt is exercising in the pool 1 day a week, and doing stretching and Pilates with a trainer. Pt goal of weight loss has not seen any significant progress. Pt does voice that he has more stamina and strength, especially in his legs which was a goal.      Expected Outcomes Will continue to montior patient and progress exercise workloads as tolerated. Will continue to montior patient and progress exercise workloads as tolerated.               Discharge Exercise Prescription (Final Exercise Prescription Changes):  Exercise Prescription Changes - 11/02/22 1100       Response to Exercise   Blood Pressure (Admit) 136/78    Blood Pressure (Exercise) 142/72    Blood Pressure (Exit) 100/62    Heart Rate (Admit) 71 bpm    Heart Rate (Exercise) 105 bpm    Heart Rate (Exit) 76 bpm    Rating of Perceived Exertion (Exercise) 12    Symptoms None    Comments Reviewed METs, goals and Home exercise Rx    Duration Continue with 30 min of aerobic exercise without signs/symptoms of physical distress.    Intensity THRR unchanged      Progression   Progression Continue to progress workloads to maintain intensity without signs/symptoms of physical distress.    Average METs 3.4      Resistance Training   Training Prescription No    Weight No weigts on  Wednesdays      Interval Training   Interval Training No      NuStep   Level 2    SPM 112    Minutes 30    METs 3.4      Home Exercise Plan   Plans to continue exercise at Home (comment)    Frequency Add 2 additional days to program exercise sessions.    Initial Home Exercises Provided 11/02/22             Nutrition:  Target Goals: Understanding of nutrition guidelines, daily intake of sodium '1500mg'$ , cholesterol '200mg'$ , calories 30% from fat and 7% or less from saturated fats, daily to have 5 or more servings of fruits and vegetables.  Biometrics:  Pre Biometrics - 10/11/22 0800       Pre Biometrics   Waist Circumference 44 inches    Hip Circumference 45 inches    Waist to Hip Ratio 0.98 %    Triceps Skinfold 17 mm    % Body Fat 31.9 %    Grip Strength 30 kg    Flexibility --   Not perfomed; significant back issues   Single Leg Stand 1 seconds              Nutrition Therapy Plan and Nutrition Goals:  Nutrition Therapy & Goals - 10/19/22 1014       Nutrition Therapy   Diet Heart Healthy Diet    Drug/Food Interactions Statins/Certain Fruits      Personal Nutrition  Goals   Personal Goal #2 Patient to limit sodium intake to '1500mg'$  per day    Personal Goal #3 Patient to improve diet quality by using the plate mehtod as a guide for meal planning to include lean protein/plant protien, fruits, vegetables, whole grains, and nonfat dairy as part of heart healthy lifestyle    Comments Aahil reports motivation to make lifestyle changes to aid with heart health. He reports over consuming salt and regular alcohol intake. His wife is supportive of making lifestyle changes      Intervention Plan   Intervention Prescribe, educate and counsel regarding individualized specific dietary modifications aiming towards targeted core components such as weight, hypertension, lipid management, diabetes, heart failure and other comorbidities.;Nutrition handout(s) given to patient.     Expected Outcomes Short Term Goal: Understand basic principles of dietary content, such as calories, fat, sodium, cholesterol and nutrients.;Long Term Goal: Adherence to prescribed nutrition plan.             Nutrition Assessments:  Nutrition Assessments - 10/19/22 1026       Rate Your Plate Scores   Pre Score 57            MEDIFICTS Score Key: ?70 Need to make dietary changes  40-70 Heart Healthy Diet ? 40 Therapeutic Level Cholesterol Diet   Flowsheet Row INTENSIVE CARDIAC REHAB from 10/19/2022 in [facility] for Heart, Vascular, & Lung Health  Picture Your Plate Total Score on Admission 57      Picture Your Plate Scores: <25 Unhealthy dietary pattern with much room for improvement. 41-50 Dietary pattern unlikely to meet recommendations for good health and room for improvement. 51-60 More healthful dietary pattern, with some room for improvement.  >60 Healthy dietary pattern, although there may be some specific behaviors that could be improved.    Nutrition Goals Re-Evaluation:  Nutrition Goals Re-Evaluation     [place] Name 10/19/22 1014             Goals   Current Weight 210 lb 15.7 oz (95.7 kg)  orientation weight       Nutrition Goal Patient to identify strategies for reducing cardiovascular risk by attending the weekly P[facility]ikin education and nutrition series.       Expected Outcome Josephine reports motivation to make lifestyle changes to aid with heart health. He reports over consuming salt and regular alcohol intake. Alen will benefit from adhereance to the Pritikin eating plan to support decrease sodium intake of '1500mg'$  daily and increased dietary fiber >30-38g fiber daily. He is receptive to reducing alcohol and sugar intake.                Nutrition Goals Re-Evaluation:  Nutrition Goals Re-Evaluation     [place] Name 10/19/22 1014             Goals   Current Weight 210 lb 15.7 oz (95.7 kg)  orientation weight       Nutrition  Goal Patient to identify strategies for reducing cardiovascular risk by attending the weekly Pritikin education and nutrition series.       Expected Outcome Kert reports motivation to make lifestyle changes to aid with heart health. He reports over consuming salt and regular alcohol intake. Willliam will benefit from adhereance to the Pritikin eating plan to support decrease sodium intake of '1500mg'$  daily and increased dietary fiber >30-38g fiber daily. He is receptive to reducing alcohol and sugar intake.  Nutrition Goals Discharge (Final Nutrition Goals Re-Evaluation):  Nutrition Goals Re-Evaluation - 10/19/22 1014       Goals   Current Weight 210 lb 15.7 oz (95.7 kg)   orientation weight   Nutrition Goal Patient to identify strategies for reducing cardiovascular risk by attending the weekly Pritikin education and nutrition series.    Expected Outcome Jake reports motivation to make lifestyle changes to aid with heart health. He reports over consuming salt and regular alcohol intake. Lennyn will benefit from adhereance to the Pritikin eating plan to support decrease sodium intake of '1500mg'$  daily and increased dietary fiber >30-38g fiber daily. He is receptive to reducing alcohol and sugar intake.             Psychosocial: Target Goals: Acknowledge presence or absence of significant depression and/or stress, maximize coping skills, provide positive support system. Participant is able to verbalize types and ability to use techniques and skills needed for reducing stress and depression.  Initial Review & Psychosocial Screening:  Initial Psych Review & Screening - 10/11/22 0851       Initial Review   Current issues with None Identified;History of Depression;Current Stress Concerns   Dareion denies being depressed currently   Source of Stress Concerns Chronic Illness;Unable to participate in former interests or hobbies;Unable to perform yard/household activities      [place]? Yes   Dyami has his wife for support     Barriers   Psychosocial barriers to participate in program There are no identifiable barriers or psychosocial needs.      Screening Interventions   Interventions Encouraged to exercise;Provide feedback about the scores to participant    Expected Outcomes Long Term Goal: Stressors or current issues are controlled or eliminated.             Quality of Life Scores:  Quality of Life - 10/11/22 0956       Quality of Life   Select Quality of Life      Quality of Life Scores   Health/Function Pre 19.83 %    Socioeconomic Pre 28.33 %    Psych/Spiritual Pre 20.93 %    Family Pre 27 %    GLOBAL Pre 22.7 %            Scores of 19 and below usually indicate a poorer quality of life in these areas.  A difference of  2-3 points is a clinically meaningful difference.  A difference of 2-3 points in the total score of the Quality of Life Index has been associated with significant improvement in overall quality of life, self-image, physical symptoms, and general health in studies assessing change in quality of life.  PHQ-9: Review Flowsheet       10/11/2022  Depression screen PHQ 2/9  Decreased Interest 0  Down, Depressed, Hopeless 0  PHQ - 2 Score 0   Interpretation of Total Score  Total Score Depression Severity:  1-4 = Minimal depression, 5-9 = Mild depression, 10-14 = Moderate depression, 15-19 = Moderately severe depression, 20-27 = Severe depression   Psychosocial Evaluation and Intervention:   Psychosocial Re-Evaluation:  Psychosocial Re-Evaluation     [place] Name 11/15/22 1412             Psychosocial Re-Evaluation   Current issues with None Identified       Interventions Encouraged to attend Cardiac Rehabilitation for the exercise       Continue Psychosocial Services  No Follow up required  Psychosocial Discharge (Final Psychosocial Re-Evaluation):  Psychosocial  Re-Evaluation - 11/15/22 1412       Psychosocial Re-Evaluation   Current issues with None Identified    Interventions Encouraged to attend Cardiac Rehabilitation for the exercise    Continue Psychosocial Services  No Follow up required             Vocational Rehabilitation: Provide vocational rehab assistance to qualifying candidates.   Vocational Rehab Evaluation & Intervention:  Vocational Rehab - 10/11/22 0973       Initial Vocational Rehab Evaluation & Intervention   Assessment shows need for Vocational Rehabilitation No   Kasem is retired and does not need vocational rehab at this time            Education: Education Goals: Education classes will be provided on a weekly basis, covering required topics. Participant will state understanding/return demonstration of topics presented.    Education     Row Name 10/19/22 1000     Education   Cardiac Education Topics [employer]   Educator Dietitian   Weekly Topic One-Pot Wonders   Instruction Review Code 1- Verbalizes Understanding   Class Start Time [phone]   Class Stop Time 0900   Class Time Calculation (min) 45 min    Row Name 10/21/22 1000     Education   Cardiac Education Topics P[employer]ikin   Select Core Videos     Core Videos   Educator Dietitian   Select Nutrition   Nutrition Dining Out - Part 1   Instruction Review Code 1- Verbalizes Understanding   Class Start Time 0815   Class Stop Time 0903   Class Time Calculation (min) 48 min    [place] Name 10/24/22 0900     Education   Cardiac Education Topics [facility]ikin   Select Core Videos     Core Videos   Educator Exercise Physiologist   Select Exercise Education   Exercise Education Biomechanial Limitations   Instruction Review Code 1- Verbalizes Understanding   Class Start Time [phone]   Class Stop Time 0901   Class Time Calculation (min) 49 min    [place] Name 10/26/22 1200     Education   Cardiac Education  Topics Pritikin   Financial trader   Weekly Topic Fast Evening Meals  Butternut Hickory Soup   Instruction Review Code 1- Verbalizes Understanding   Class Start Time [phone]   Class Stop Time [phone]   Class Time Calculation (min) 41 min    [place] Name 10/28/22 1500     Education   Cardiac Education Topics Pritikin   Select Core Videos     Core Videos   Educator Exercise Physiologist   Select Psychosocial   Psychosocial How Our Thoughts Can Heal Our Hearts   Instruction Review Code 1- Verbalizes Understanding   Class Start Time 0805   Class Stop Time 0850   Class Time Calculation (min) 45 min    [place] Name 10/31/22 0900     Education   Cardiac Education Topics Pritikin   Select Workshops     Workshops   Educator Dietitian   Select Nutrition   Nutrition Workshop Fueling a Designer, multimedia   Instruction Review Code 1- Verbalizes Understanding   Class Start Time 0815   Class Stop Time 0901   Class Time Calculation (min) 46 min    [place] Name 11/02/22 1200  Education   Cardiac Education Topics Pritikin   Financial trader   Weekly Topic International Cuisine- Spotlight on the [facility] Zones   Instruction Review Code 1- Verbalizes Understanding   Class Start Time 0815   Class Stop Time [phone]   Class Time Calculation (min) 37 min    [place] Name 11/09/22 1200     Education   Cardiac Education Topics Pritikin   Select Core Videos     Core Videos   Educator Exercise Physiologist   Select Nutrition   Nutrition Cooking - Healthy Salads and Dressing   Instruction Review Code 1- Verbalizes Understanding   Class Start Time 0803   Class Stop Time [phone]   Class Time Calculation (min) 39 min            Core Videos: Exercise    Move It!  Clinical staff conducted group or individual video education with verbal and written material and guidebook.  Patient learns the recommended Pritikin  exercise program. Exercise with the goal of living a long, healthy life. Some of the health benefits of exercise include controlled diabetes, healthier blood pressure levels, improved cholesterol levels, improved heart and lung capacity, improved sleep, and better body composition. Everyone should speak with their doctor before starting or changing an exercise routine.  Biomechanical Limitations Clinical staff conducted group or individual video education with verbal and written material and guidebook.  Patient learns how biomechanical limitations can impact exercise and how we can mitigate and possibly overcome limitations to have an impactful and balanced exercise routine.  Body Composition Clinical staff conducted group or individual video education with verbal and written material and guidebook.  Patient learns that body composition (ratio of muscle mass to fat mass) is a key component to assessing overall fitness, rather than body weight alone. Increased fat mass, especially visceral belly fat, can put [place] at increased risk for metabolic syndrome, type 2 diabetes, heart disease, and even death. It is recommended to combine diet and exercise (cardiovascular and resistance training) to improve your body composition. Seek guidance from your physician and exercise physiologist before implementing an exercise routine.  Exercise Action Plan Clinical staff conducted group or individual video education with verbal and written material and guidebook.  Patient learns the recommended strategies to achieve and enjoy long-term exercise adherence, including variety, self-motivation, self-efficacy, and positive decision making. Benefits of exercise include fitness, good health, weight management, more energy, better sleep, less stress, and overall well-being.  Medical   Heart Disease Risk Reduction Clinical staff conducted group or individual video education with verbal and written material and guidebook.   Patient learns our heart is our most vital organ as it circulates oxygen, nutrients, white blood cells, and hormones throughout the entire body, and carries waste away. Data supports a plant-based eating plan like the Pritikin Program for its effectiveness in slowing progression of and reversing heart disease. The video provides a number of recommendations to address heart disease.   Metabolic Syndrome and Belly Fat  Clinical staff conducted group or individual video education with verbal and written material and guidebook.  Patient learns what metabolic syndrome is, how it leads to heart disease, and how one can reverse it and keep it from coming back. You have metabolic syndrome if you have 3 of the following 5 criteria: abdominal obesity, high blood pressure, high triglycerides, low HDL cholesterol, and high blood sugar.  Hypertension and Heart Disease Clinical staff conducted group or  individual video education with verbal and written material and guidebook.  Patient learns that high blood pressure, or hypertension, is very common in the [place]. Hypertension is largely due to excessive salt intake, but other important risk factors include being overweight, physical inactivity, drinking too much alcohol, smoking, and not eating enough potassium from fruits and vegetables. High blood pressure is a leading risk factor for heart attack, stroke, congestive heart failure, dementia, kidney failure, and premature death. Long-term effects of excessive salt intake include stiffening of the arteries and thickening of heart muscle and organ damage. Recommendations include ways to reduce hypertension and the risk of heart disease.  Diseases of Our Time - Focusing on Diabetes Clinical staff conducted group or individual video education with verbal and written material and guidebook.  Patient learns why the best way to stop diseases of our time is prevention, through food and other lifestyle changes.  Medicine (such as prescription pills and surgeries) is often only a Band-Aid on the problem, not a long-term solution. Most common diseases of our time include obesity, type 2 diabetes, hypertension, heart disease, and cancer. The Pritikin Program is recommended and has been proven to help reduce, reverse, and/or prevent the damaging effects of metabolic syndrome.  Nutrition   Overview of the Pritikin Eating Plan  Clinical staff conducted group or individual video education with verbal and written material and guidebook.  Patient learns about the [place] for disease risk reduction. The [place] emphasizes a wide variety of unrefined, minimally-processed carbohydrates, like fruits, vegetables, whole grains, and legumes. Go, Caution, and Stop food choices are explained. Plant-based and lean animal proteins are emphasized. Rationale provided for low sodium intake for blood pressure control, low added sugars for blood sugar stabilization, and low added fats and oils for coronary artery disease risk reduction and weight management.  Calorie Density  Clinical staff conducted group or individual video education with verbal and written material and guidebook.  Patient learns about calorie density and how it impacts the Pritikin Eating Plan. Knowing the characteristics of the food you choose will help you decide whether those foods will lead to weight gain or weight loss, and whether you want to consume more or less of them. Weight loss is usually a side effect of the Pritikin Eating Plan because of its focus on low calorie-dense foods.  Label Reading  Clinical staff conducted group or individual video education with verbal and written material and guidebook.  Patient learns about the Pritikin recommended label reading guidelines and corresponding recommendations regarding calorie density, added sugars, sodium content, and whole grains.  Dining Out - Part 1  Clinical staff conducted  group or individual video education with verbal and written material and guidebook.  Patient learns that restaurant meals can be sabotaging because they can be so high in calories, fat, sodium, and/or sugar. Patient learns recommended strategies on how to positively address this and avoid unhealthy pitfalls.  Facts on Fats  Clinical staff conducted group or individual video education with verbal and written material and guidebook.  Patient learns that lifestyle modifications can be just as effective, if not more so, as many medications for lowering your risk of heart disease. A Pritikin lifestyle can help to reduce your risk of inflammation and atherosclerosis (cholesterol build-up, or plaque, in the artery walls). Lifestyle interventions such as dietary choices and physical activity address the cause of atherosclerosis. A review of the types of fats and their impact on blood cholesterol levels, along with dietary  recommendations to reduce fat intake is also included.  Nutrition Action Plan  Clinical staff conducted group or individual video education with verbal and written material and guidebook.  Patient learns how to incorporate Pritikin recommendations into their lifestyle. Recommendations include planning and keeping personal health goals in mind as an important part of their success.  Healthy Mind-Set    Healthy Minds, Bodies, Hearts  Clinical staff conducted group or individual video education with verbal and written material and guidebook.  Patient learns how to identify when they are stressed. Video will discuss the impact of that stress, as well as the many benefits of stress management. Patient will also be introduced to stress management techniques. The way we think, act, and feel has an impact on our hearts.  How Our Thoughts Can Heal Our Hearts  Clinical staff conducted group or individual video education with verbal and written material and guidebook.  Patient learns that negative  thoughts can cause depression and anxiety. This can result in negative lifestyle behavior and serious health problems. Cognitive behavioral therapy is an effective method to help control our thoughts in order to change and improve our emotional outlook.  Additional Videos:  Exercise    Improving Performance  Clinical staff conducted group or individual video education with verbal and written material and guidebook.  Patient learns to use a non-linear approach by alternating intensity levels and lengths of time spent exercising to help burn more calories and lose more body fat. Cardiovascular exercise helps improve heart health, metabolism, hormonal balance, blood sugar control, and recovery from fatigue. Resistance training improves strength, endurance, balance, coordination, reaction time, metabolism, and muscle mass. Flexibility exercise improves circulation, posture, and balance. Seek guidance from your physician and exercise physiologist before implementing an exercise routine and learn your capabilities and proper form for all exercise.  Introduction to Yoga  Clinical staff conducted group or individual video education with verbal and written material and guidebook.  Patient learns about yoga, a discipline of the coming together of mind, breath, and body. The benefits of yoga include improved flexibility, improved range of motion, better posture and core strength, increased lung function, weight loss, and positive self-image. Yoga's heart health benefits include lowered blood pressure, healthier heart rate, decreased cholesterol and triglyceride levels, improved immune function, and reduced stress. Seek guidance from your physician and exercise physiologist before implementing an exercise routine and learn your capabilities and proper form for all exercise.  Medical   Aging: Enhancing Your Quality of Life  Clinical staff conducted group or individual video education with verbal and written  material and guidebook.  Patient learns key strategies and recommendations to stay in good physical health and enhance quality of life, such as prevention strategies, having an advocate, securing a [place], and keeping a list of medications and system for tracking them. It also discusses how to avoid risk for bone loss.  Biology of Weight Control  Clinical staff conducted group or individual video education with verbal and written material and guidebook.  Patient learns that weight gain occurs because we consume more calories than we burn (eating more, moving less). Even if your body weight is normal, you may have higher ratios of fat compared to muscle mass. Too much body fat puts you at increased risk for cardiovascular disease, heart attack, stroke, type 2 diabetes, and obesity-related cancers. In addition to exercise, following the [place] can help reduce your risk.  Decoding Lab Results  Clinical staff conducted  group or individual video education with verbal and written material and guidebook.  Patient learns that lab test reflects one measurement whose values change over time and are influenced by many factors, including medication, stress, sleep, exercise, food, hydration, pre-existing medical conditions, and more. It is recommended to use the knowledge from this video to become more involved with your lab results and evaluate your numbers to speak with your doctor.   Diseases of Our Time - Overview  Clinical staff conducted group or individual video education with verbal and written material and guidebook.  Patient learns that according to the CDC, 50% to 70% of chronic diseases (such as obesity, type 2 diabetes, elevated lipids, hypertension, and heart disease) are avoidable through lifestyle improvements including healthier food choices, listening to satiety cues, and increased physical activity.  Sleep Disorders Clinical staff conducted group  or individual video education with verbal and written material and guidebook.  Patient learns how good quality and duration of sleep are important to overall health and well-being. Patient also learns about sleep disorders and how they impact health along with recommendations to address them, including discussing with a physician.  Nutrition  Dining Out - Part 2 Clinical staff conducted group or individual video education with verbal and written material and guidebook.  Patient learns how to plan ahead and communicate in order to maximize their dining experience in a healthy and nutritious manner. Included are recommended food choices based on the type of restaurant the patient is visiting.   Fueling a Best boy conducted group or individual video education with verbal and written material and guidebook.  There is a strong connection between our food choices and our health. Diseases like obesity and type 2 diabetes are very prevalent and are in large-part due to lifestyle choices. The Pritikin Eating Plan provides plenty of food and hunger-curbing satisfaction. It is easy to follow, affordable, and helps reduce health risks.  Menu Workshop  Clinical staff conducted group or individual video education with verbal and written material and guidebook.  Patient learns that restaurant meals can sabotage health goals because they are often packed with calories, fat, sodium, and sugar. Recommendations include strategies to plan ahead and to communicate with the manager, chef, or server to help order a healthier meal.  Planning Your Eating Strategy  Clinical staff conducted group or individual video education with verbal and written material and guidebook.  Patient learns about the [place] and its benefit of reducing the risk of disease. The [place] does not focus on calories. Instead, it emphasizes high-quality, nutrient-rich foods. By knowing the  characteristics of the foods, we choose, we can determine their calorie density and make informed decisions.  Targeting Your Nutrition Priorities  Clinical staff conducted group or individual video education with verbal and written material and guidebook.  Patient learns that lifestyle habits have a tremendous impact on disease risk and progression. This video provides eating and physical activity recommendations based on your personal health goals, such as reducing LDL cholesterol, losing weight, preventing or controlling type 2 diabetes, and reducing high blood pressure.  Vitamins and Minerals  Clinical staff conducted group or individual video education with verbal and written material and guidebook.  Patient learns different ways to obtain key vitamins and minerals, including through a recommended healthy diet. It is important to discuss all supplements you take with your doctor.   Healthy Mind-Set    Smoking Cessation  Clinical staff conducted group or individual video education  with verbal and written material and guidebook.  Patient learns that cigarette smoking and tobacco addiction pose a serious health risk which affects millions of people. Stopping smoking will significantly reduce the risk of heart disease, lung disease, and many forms of cancer. Recommended strategies for quitting are covered, including working with your doctor to develop a successful plan.  Culinary   Becoming a Financial trader conducted group or individual video education with verbal and written material and guidebook.  Patient learns that cooking at home can be healthy, cost-effective, quick, and puts them in control. Keys to cooking healthy recipes will include looking at your recipe, assessing your equipment needs, planning ahead, making it simple, choosing cost-effective seasonal ingredients, and limiting the use of added fats, salts, and sugars.  Cooking - Breakfast and Snacks  Clinical staff  conducted group or individual video education with verbal and written material and guidebook.  Patient learns how important breakfast is to satiety and nutrition through the entire day. Recommendations include key foods to eat during breakfast to help stabilize blood sugar levels and to prevent overeating at meals later in the day. Planning ahead is also a key component.  Cooking - Human resources officer conducted group or individual video education with verbal and written material and guidebook.  Patient learns eating strategies to improve overall health, including an approach to cook more at home. Recommendations include thinking of animal protein as a side on your plate rather than center stage and focusing instead on lower calorie dense options like vegetables, fruits, whole grains, and plant-based proteins, such as beans. Making sauces in large quantities to freeze for later and leaving the skin on your vegetables are also recommended to maximize your experience.  Cooking - Healthy Salads and Dressing Clinical staff conducted group or individual video education with verbal and written material and guidebook.  Patient learns that vegetables, fruits, whole grains, and legumes are the foundations of the [place]. Recommendations include how to incorporate each of these in flavorful and healthy salads, and how to create homemade salad dressings. Proper handling of ingredients is also covered. Cooking - Soups and [employer] - Soups and Desserts Clinical staff conducted group or individual video education with verbal and written material and guidebook.  Patient learns that Pritikin soups and desserts make for easy, nutritious, and delicious snacks and meal components that are low in sodium, fat, sugar, and calorie density, while high in vitamins, minerals, and filling fiber. Recommendations include simple and healthy ideas for soups and desserts.   Overview     The  Pritikin Solution Program Overview Clinical staff conducted group or individual video education with verbal and written material and guidebook.  Patient learns that the results of the [place] Program have been documented in more than 100 articles published in peer-reviewed journals, and the benefits include reducing risk factors for (and, in some cases, even reversing) high cholesterol, high blood pressure, type 2 diabetes, obesity, and more! An overview of the three key pillars of the Pritikin Program will be covered: eating well, doing regular exercise, and having a healthy mind-set.  WORKSHOPS  Exercise: Exercise Basics: Building Your Action Plan Clinical staff led group instruction and group discussion with PowerPoint presentation and patient guidebook. To enhance the learning environment the use of posters, models and videos may be added. At the conclusion of this workshop, patients will comprehend the difference between physical activity and exercise, as well as the benefits of incorporating  both, into their routine. Patients will understand the FITT (Frequency, Intensity, Time, and Type) principle and how to use it to build an exercise action plan. In addition, safety concerns and other considerations for exercise and cardiac rehab will be addressed by the presenter. The purpose of this lesson is to promote a comprehensive and effective weekly exercise routine in order to improve patients' overall level of fitness.   Managing Heart Disease: Your Path to a Healthier Heart Clinical staff led group instruction and group discussion with PowerPoint presentation and patient guidebook. To enhance the learning environment the use of posters, models and videos may be added.At the conclusion of this workshop, patients will understand the anatomy and physiology of the heart. Additionally, they will understand how Pritikin's three pillars impact the risk factors, the progression, and the management  of heart disease.  The purpose of this lesson is to provide a high-level overview of the heart, heart disease, and how the Pritikin lifestyle positively impacts risk factors.  Exercise Biomechanics Clinical staff led group instruction and group discussion with PowerPoint presentation and patient guidebook. To enhance the learning environment the use of posters, models and videos may be added. Patients will learn how the structural parts of their bodies function and how these functions impact their daily activities, movement, and exercise. Patients will learn how to promote a neutral spine, learn how to manage pain, and identify ways to improve their physical movement in order to promote healthy living. The purpose of this lesson is to expose patients to common physical limitations that impact physical activity. Participants will learn practical ways to adapt and manage aches and pains, and to minimize their effect on regular exercise. Patients will learn how to maintain good posture while sitting, walking, and lifting.  Balance Training and Fall Prevention  Clinical staff led group instruction and group discussion with PowerPoint presentation and patient guidebook. To enhance the learning environment the use of posters, models and videos may be added. At the conclusion of this workshop, patients will understand the importance of their sensorimotor skills (vision, proprioception, and the vestibular system) in maintaining their ability to balance as they age. Patients will apply a variety of balancing exercises that are appropriate for their current level of function. Patients will understand the common causes for poor balance, possible solutions to these problems, and ways to modify their physical environment in order to minimize their fall risk. The purpose of this lesson is to teach patients about the importance of maintaining balance as they age and ways to minimize their risk of  falling.  WORKSHOPS   Nutrition:  Fueling a Scientist, research (physical sciences) led group instruction and group discussion with PowerPoint presentation and patient guidebook. To enhance the learning environment the use of posters, models and videos may be added. Patients will review the foundational principles of the [place] and understand what constitutes a serving size in each of the food groups. Patients will also learn Pritikin-friendly foods that are better choices when away from home and review make-ahead meal and snack options. Calorie density will be reviewed and applied to three nutrition priorities: weight maintenance, weight loss, and weight gain. The purpose of this lesson is to reinforce (in a group setting) the key concepts around what patients are recommended to eat and how to apply these guidelines when away from home by planning and selecting Pritikin-friendly options. Patients will understand how calorie density may be adjusted for different weight management goals.  Mindful Eating  Clinical staff  led group instruction and group discussion with PowerPoint presentation and patient guidebook. To enhance the learning environment the use of posters, models and videos may be added. Patients will briefly review the concepts of the [place] and the importance of low-calorie dense foods. The concept of mindful eating will be introduced as well as the importance of paying attention to internal hunger signals. Triggers for non-hunger eating and techniques for dealing with triggers will be explored. The purpose of this lesson is to provide patients with the opportunity to review the basic principles of the [place], discuss the value of eating mindfully and how to measure internal cues of hunger and fullness using the Hunger Scale. Patients will also discuss reasons for non-hunger eating and learn strategies to use for controlling emotional eating.  Targeting Your  Nutrition Priorities Clinical staff led group instruction and group discussion with PowerPoint presentation and patient guidebook. To enhance the learning environment the use of posters, models and videos may be added. Patients will learn how to determine their genetic susceptibility to disease by reviewing their family history. Patients will gain insight into the importance of diet as part of an overall healthy lifestyle in mitigating the impact of genetics and other environmental insults. The purpose of this lesson is to provide patients with the opportunity to assess their personal nutrition priorities by looking at their family history, their own health history and current risk factors. Patients will also be able to discuss ways of prioritizing and modifying the [place] for their highest risk areas  Menu  Clinical staff led group instruction and group discussion with PowerPoint presentation and patient guidebook. To enhance the learning environment the use of posters, models and videos may be added. Using menus brought in from [employer], or printed from [employer], patients will apply the [place] dining out guidelines that were presented in the [employer] video. Patients will also be able to practice these guidelines in a variety of provided scenarios. The purpose of this lesson is to provide patients with the opportunity to practice hands-on learning of the [place] with actual menus and practice scenarios.  Label Reading Clinical staff led group instruction and group discussion with PowerPoint presentation and patient guidebook. To enhance the learning environment the use of posters, models and videos may be added. Patients will review and discuss the [facility] label reading guidelines presented in [facility]'s Label Reading Educational series video. Using fool labels brought in from local grocery stores and markets, patients will apply the  label reading guidelines and determine if the packaged food meet the [facility] guidelines. The purpose of this lesson is to provide patients with the opportunity to review, discuss, and practice hands-on learning of the [facility] Label Reading guidelines with actual packaged food labels. [place] Workshops are designed to teach patients ways to prepare quick, simple, and affordable recipes at home. The importance of nutrition's role in chronic disease risk reduction is reflected in its emphasis in the overall [facility] program. By learning how to prepare essential core [facility] Eating Plan recipes, patients will increase control over what they eat; be able to customize the flavor of foods without the use of added salt, sugar, or fat; and improve the quality of the food they consume. By learning a set of core recipes which are easily assembled, quickly prepared, and affordable, patients are more likely to prepare more healthy foods at home. These workshops focus on convenient breakfasts,  simple entres, side dishes, and desserts which can be prepared with minimal effort and are consistent with nutrition recommendations for cardiovascular risk reduction. Cooking [employer] are taught by a Engineer, materials (RD) who has been trained by the [employer]. The chef or RD has a clear understanding of the importance of minimizing - if not completely eliminating - added fat, sugar, and sodium in recipes. Throughout the series of [place] Workshop sessions, patients will learn about healthy ingredients and efficient methods of cooking to build confidence in their capability to prepare    Cooking School weekly topics:  Adding Flavor- Sodium-Free  Fast and Healthy Breakfasts  Powerhouse Plant-Based Proteins  Satisfying Salads and Dressings  Simple Sides and Sauces  International Cuisine-Spotlight on the [place] Zones  Delicious Desserts  Savory  Soups  Efficiency Cooking - Meals in a Snap  Tasty Appetizers and Snacks  Comforting Weekend Breakfasts  One-Pot Wonders   Fast Evening Meals  Easy [place] (Psychosocial): New Thoughts, New Behaviors Clinical staff led group instruction and group discussion with PowerPoint presentation and patient guidebook. To enhance the learning environment the use of posters, models and videos may be added. Patients will learn and practice techniques for developing effective health and lifestyle goals. Patients will be able to effectively apply the goal setting process learned to develop at least one new personal goal.  The purpose of this lesson is to expose patients to a new skill set of behavior modification techniques such as techniques setting SMART goals, overcoming barriers, and achieving new thoughts and new behaviors.  Managing Moods and Relationships Clinical staff led group instruction and group discussion with PowerPoint presentation and patient guidebook. To enhance the learning environment the use of posters, models and videos may be added. Patients will learn how emotional and chronic stress factors can impact their health and relationships. They will learn healthy ways to manage their moods and utilize positive coping mechanisms. In addition, ICR patients will learn ways to improve communication skills. The purpose of this lesson is to expose patients to ways of understanding how one's mood and health are intimately connected. Developing a healthy outlook can help build positive relationships and connections with others. Patients will understand the importance of utilizing effective communication skills that include actively listening and being heard. They will learn and understand the importance of the "4 Cs" and especially Connections in fostering of a Healthy Mind-Set.  Healthy Sleep for a Healthy Heart Clinical staff led  group instruction and group discussion with PowerPoint presentation and patient guidebook. To enhance the learning environment the use of posters, models and videos may be added. At the conclusion of this workshop, patients will be able to demonstrate knowledge of the importance of sleep to overall health, well-being, and quality of life. They will understand the symptoms of, and treatments for, common sleep disorders. Patients will also be able to identify daytime and nighttime behaviors which impact sleep, and they will be able to apply these tools to help manage sleep-related challenges. The purpose of this lesson is to provide patients with a general overview of sleep and outline the importance of quality sleep. Patients will learn about a few of the most common sleep disorders. Patients will also be introduced to the concept of "sleep hygiene," and discover ways to self-manage certain sleeping problems through simple daily behavior changes. Finally, the workshop will motivate patients by clarifying the links between quality  sleep and their goals of heart-healthy living.   Recognizing and Reducing Stress Clinical staff led group instruction and group discussion with PowerPoint presentation and patient guidebook. To enhance the learning environment the use of posters, models and videos may be added. At the conclusion of this workshop, patients will be able to understand the types of stress reactions, differentiate between acute and chronic stress, and recognize the impact that chronic stress has on their health. They will also be able to apply different coping mechanisms, such as reframing negative self-talk. Patients will have the opportunity to practice a variety of stress management techniques, such as deep abdominal breathing, progressive muscle relaxation, and/or guided imagery.  The purpose of this lesson is to educate patients on the role of stress in their lives and to provide healthy techniques for  coping with it.  Learning Barriers/Preferences:  Learning Barriers/Preferences - 10/11/22 0956       Learning Barriers/Preferences   Learning Barriers Sight;Hearing   wears glasses, HOH both ears   Learning Preferences Verbal Instruction;Written Material;Computer/Internet             Education Topics:  Knowledge Questionnaire Score:  Knowledge Questionnaire Score - 10/11/22 0958       Knowledge Questionnaire Score   Pre Score 19/24             Core Components/Risk Factors/Patient Goals at Admission:  Personal Goals and Risk Factors at Admission - 10/11/22 0958       Core Components/Risk Factors/Patient Goals on Admission    Weight Management Yes;Obesity;Weight Loss    Intervention Weight Management: Develop a combined nutrition and exercise program designed to reach desired caloric intake, while maintaining appropriate intake of nutrient and fiber, sodium and fats, and appropriate energy expenditure required for the weight goal.;Weight Management: Provide education and appropriate resources to help participant work on and attain dietary goals.;Weight Management/Obesity: Establish reasonable short term and long term weight goals.;Obesity: Provide education and appropriate resources to help participant work on and attain dietary goals.    Admit Weight 210 lb 15.7 oz (95.7 kg)    Goal Weight: Long Term 190 lb (86.2 kg)   Pt goal   Expected Outcomes Short Term: Continue to assess and modify interventions until short term weight is achieved;Long Term: Adherence to nutrition and physical activity/exercise program aimed toward attainment of established weight goal;Weight Maintenance: Understanding of the daily nutrition guidelines, which includes 25-35% calories from fat, 7% or less cal from saturated fats, less than '200mg'$  cholesterol, less than 1.5gm of sodium, & 5 or more servings of fruits and vegetables daily;Weight Loss: Understanding of general recommendations for a balanced  deficit meal plan, which promotes 1-2 lb weight loss per week and includes a negative energy balance of [phone] kcal/d;Understanding recommendations for meals to include 15-35% energy as protein, 25-35% energy from fat, 35-60% energy from carbohydrates, less than '200mg'$  of dietary cholesterol, 20-35 gm of total fiber daily;Understanding of distribution of calorie intake throughout the day with the consumption of 4-5 meals/snacks    Hypertension Yes    Intervention Provide education on lifestyle modifcations including regular physical activity/exercise, weight management, moderate sodium restriction and increased consumption of fresh fruit, vegetables, and low fat dairy, alcohol moderation, and smoking cessation.;Monitor prescription use compliance.    Expected Outcomes Short Term: Continued assessment and intervention until BP is < 140/40m HG in hypertensive participants. < 130/816mHG in hypertensive participants with diabetes, heart failure or chronic kidney disease.;Long Term: Maintenance of blood pressure at goal levels.  Lipids Yes    Intervention Provide education and support for participant on nutrition & aerobic/resistive exercise along with prescribed medications to achieve LDL '70mg'$ , HDL >'40mg'$ .    Expected Outcomes Short Term: Participant states understanding of desired cholesterol values and is compliant with medications prescribed. Participant is following exercise prescription and nutrition guidelines.;Long Term: Cholesterol controlled with medications as prescribed, with individualized exercise RX and with personalized nutrition plan. Value goals: LDL < '70mg'$ , HDL > 40 mg.    Stress Yes    Intervention Offer individual and/or small group education and counseling on adjustment to heart disease, stress management and health-related lifestyle change. Teach and support self-help strategies.;Refer participants experiencing significant psychosocial distress to appropriate mental health specialists  for further evaluation and treatment. When possible, include family members and significant others in education/counseling sessions.    Expected Outcomes Short Term: Participant demonstrates changes in health-related behavior, relaxation and other stress management skills, ability to obtain effective social support, and compliance with psychotropic medications if prescribed.;Long Term: Emotional wellbeing is indicated by absence of clinically significant psychosocial distress or social isolation.             Core Components/Risk Factors/Patient Goals Review:   Goals and Risk Factor Review     Row Name 11/15/22 1412             Core Components/Risk Factors/Patient Goals Review   Personal Goals Review Weight Management/Obesity;Stress;Hypertension;Lipids       Review Kalik is doing well with exercise, vital signs have been stable. Kinley says intensive cardiac rehab has been helpful for him and he feels stronger.       Expected Outcomes Josh will continue to particiapte in intensive cardiac rehab for exercise, nutrition and lifestyle modifications                Core Components/Risk Factors/Patient Goals at Discharge (Final Review):   Goals and Risk Factor Review - 11/15/22 1412       Core Components/Risk Factors/Patient Goals Review   Personal Goals Review Weight Management/Obesity;Stress;Hypertension;Lipids    Review Eero is doing well with exercise, vital signs have been stable. Isaias says intensive cardiac rehab has been helpful for him and he feels stronger.    Expected Outcomes Marquet will continue to particiapte in intensive cardiac rehab for exercise, nutrition and lifestyle modifications             ITP Comments:  ITP Comments     Row Name 10/11/22 0848 10/17/22 1447 11/15/22 1410       ITP Comments Dr Fransico Him Md, Medical Director, Introduction to [facility] Education program, Intensive Cardiac Rehab. Initial orientation packet reviewed with the patient 30 Day ITP  Review. Johnney plans to begin exercise on 10/19/22 30 Day ITP Review. [place] has good attendance and participation in intensvie cardiac rehab.              Comments: See ITP comments.Barnet Pall, RN,BSN 11/15/2022 2:18 PM

## 2022-11-16 ENCOUNTER — Encounter (HOSPITAL_COMMUNITY)
Admission: RE | Admit: 2022-11-16 | Discharge: 2022-11-16 | Disposition: A | Discharge status: Home / Self Care | Payer: Medicare Other | Source: Ambulatory Visit | Attending: Cardiovascular Disease | Admitting: Cardiovascular Disease

## 2022-11-16 DIAGNOSIS — Z952 Presence of prosthetic heart valve: Secondary | ICD-10-CM | POA: Insufficient documentation

## 2022-11-16 DIAGNOSIS — Z48812 Encounter for surgical aftercare following surgery on the circulatory system: Secondary | ICD-10-CM | POA: Diagnosis not present

## 2022-11-17 ENCOUNTER — Encounter: Payer: Self-pay | Admitting: Cardiovascular Disease

## 2022-11-17 ENCOUNTER — Telehealth: Payer: Self-pay | Admitting: Cardiovascular Disease

## 2022-11-17 DIAGNOSIS — I484 Atypical atrial flutter: Secondary | ICD-10-CM

## 2022-11-17 MED ORDER — APIXABAN 5 MG PO TABS: 5.0000 mg | ORAL_TABLET | Freq: Two times a day (BID) | ORAL | 5 refills | Status: DC

## 2022-11-17 NOTE — Telephone Encounter (Signed)
*  STAT* If patient is at the pharmacy, call can be transferred to refill team.   1. Which medications need to be refilled? (please list name of each medication and dose if known) apixaban (ELIQUIS) 5 MG TABS tablet   2. Which pharmacy/location (including street and city if local pharmacy) is medication to be sent to? [facility], [address]   3. Do they need a 30 day or 90 day supply? 30 day

## 2022-11-17 NOTE — Telephone Encounter (Signed)
error 

## 2022-11-17 NOTE — Telephone Encounter (Signed)
Prescription refill request for Eliquis received. Indication: Afib  Last office visit: 11/15//23 [place])  Scr: 0.75 (08/24/22)  Age: 89 Weight: 95.7kg  Appropriate dose and refill sent to requested pharmacy.

## 2022-11-18 ENCOUNTER — Encounter (HOSPITAL_COMMUNITY)
Admission: RE | Admit: 2022-11-18 | Discharge: 2022-11-18 | Disposition: A | Discharge status: Home / Self Care | Payer: Medicare Other | Source: Ambulatory Visit | Attending: Cardiovascular Disease | Admitting: Cardiovascular Disease

## 2022-11-18 DIAGNOSIS — Z952 Presence of prosthetic heart valve: Secondary | ICD-10-CM

## 2022-11-18 DIAGNOSIS — Z48812 Encounter for surgical aftercare following surgery on the circulatory system: Secondary | ICD-10-CM | POA: Diagnosis not present

## 2022-11-21 ENCOUNTER — Encounter (HOSPITAL_COMMUNITY)
Admission: RE | Admit: 2022-11-21 | Discharge: 2022-11-21 | Disposition: A | Discharge status: Home / Self Care | Payer: Medicare Other | Source: Ambulatory Visit | Attending: Cardiovascular Disease | Admitting: Cardiovascular Disease

## 2022-11-21 DIAGNOSIS — Z952 Presence of prosthetic heart valve: Secondary | ICD-10-CM

## 2022-11-21 DIAGNOSIS — Z48812 Encounter for surgical aftercare following surgery on the circulatory system: Secondary | ICD-10-CM | POA: Diagnosis not present

## 2022-11-23 ENCOUNTER — Encounter (HOSPITAL_COMMUNITY)
Admission: RE | Admit: 2022-11-23 | Discharge: 2022-11-23 | Disposition: A | Discharge status: Home / Self Care | Payer: Medicare Other | Source: Ambulatory Visit | Attending: Cardiovascular Disease | Admitting: Cardiovascular Disease

## 2022-11-23 DIAGNOSIS — Z952 Presence of prosthetic heart valve: Secondary | ICD-10-CM | POA: Diagnosis not present

## 2022-11-23 DIAGNOSIS — Z48812 Encounter for surgical aftercare following surgery on the circulatory system: Secondary | ICD-10-CM | POA: Diagnosis not present

## 2022-11-25 ENCOUNTER — Encounter (HOSPITAL_COMMUNITY): Payer: Medicare Other

## 2022-11-28 ENCOUNTER — Encounter (HOSPITAL_COMMUNITY): Payer: Medicare Other

## 2022-11-30 ENCOUNTER — Encounter (HOSPITAL_COMMUNITY): Payer: Medicare Other

## 2022-12-02 ENCOUNTER — Encounter (HOSPITAL_COMMUNITY): Payer: Medicare Other

## 2022-12-05 ENCOUNTER — Encounter (HOSPITAL_COMMUNITY)
Admission: RE | Admit: 2022-12-05 | Discharge: 2022-12-05 | Disposition: A | Discharge status: Home / Self Care | Payer: Medicare Other | Source: Ambulatory Visit | Attending: Cardiovascular Disease | Admitting: Cardiovascular Disease

## 2022-12-05 DIAGNOSIS — Z952 Presence of prosthetic heart valve: Secondary | ICD-10-CM

## 2022-12-05 DIAGNOSIS — Z48812 Encounter for surgical aftercare following surgery on the circulatory system: Secondary | ICD-10-CM | POA: Diagnosis not present

## 2022-12-07 ENCOUNTER — Encounter (HOSPITAL_COMMUNITY): Payer: Medicare Other

## 2022-12-07 ENCOUNTER — Encounter (HOSPITAL_COMMUNITY)
Admission: RE | Admit: 2022-12-07 | Discharge: 2022-12-07 | Disposition: A | Discharge status: Home / Self Care | Payer: Medicare Other | Source: Ambulatory Visit | Attending: Cardiovascular Disease | Admitting: Cardiovascular Disease

## 2022-12-07 ENCOUNTER — Ambulatory Visit (HOSPITAL_COMMUNITY)
Admission: RE | Admit: 2022-12-07 | Discharge: 2022-12-07 | Disposition: A | Discharge status: Home / Self Care | Payer: Medicare Other | Source: Ambulatory Visit | Attending: Physician Assistant | Admitting: Physician Assistant

## 2022-12-07 ENCOUNTER — Encounter (HOSPITAL_COMMUNITY): Payer: Self-pay | Admitting: Physician Assistant

## 2022-12-07 VITALS — BP 144/76 | HR 66 | Ht 68.75 in | Wt 211.8 lb

## 2022-12-07 DIAGNOSIS — Z7901 Long term (current) use of anticoagulants: Secondary | ICD-10-CM | POA: Insufficient documentation

## 2022-12-07 DIAGNOSIS — I2510 Atherosclerotic heart disease of native coronary artery without angina pectoris: Secondary | ICD-10-CM | POA: Insufficient documentation

## 2022-12-07 DIAGNOSIS — I350 Nonrheumatic aortic (valve) stenosis: Secondary | ICD-10-CM | POA: Diagnosis not present

## 2022-12-07 DIAGNOSIS — I4819 Other persistent atrial fibrillation: Secondary | ICD-10-CM

## 2022-12-07 DIAGNOSIS — Z79899 Other long term (current) drug therapy: Secondary | ICD-10-CM | POA: Insufficient documentation

## 2022-12-07 DIAGNOSIS — D6869 Other thrombophilia: Secondary | ICD-10-CM | POA: Diagnosis not present

## 2022-12-07 DIAGNOSIS — Z48812 Encounter for surgical aftercare following surgery on the circulatory system: Secondary | ICD-10-CM | POA: Diagnosis not present

## 2022-12-07 DIAGNOSIS — Z952 Presence of prosthetic heart valve: Secondary | ICD-10-CM

## 2022-12-07 DIAGNOSIS — I110 Hypertensive heart disease with heart failure: Secondary | ICD-10-CM | POA: Insufficient documentation

## 2022-12-07 DIAGNOSIS — I509 Heart failure, unspecified: Secondary | ICD-10-CM | POA: Insufficient documentation

## 2022-12-07 NOTE — Progress Notes (Signed)
Primary Care Physician: Lavone Orn, MD Referring Physician: Richardson Dopp, PA Cardiologist: Dr Angelena Form EP: Dr Rayann Heman   Patrick Franco is a 89 y.o. male with a h/o HTN, previous gastric ulcer with hemorrhage, moderate aortic stenosis, h/o CHF, CAD, afib on Tikosyn. He is in the afib clinic as he had a spell of dyspnea at bedtime last night and called EMS. He had just went to bed and was putting  on his CPAP and felt winded. He sat up and tried CPAP again and became short of breath again told his wife to call EMS. The fire department was there in a few minutes when he was still feeling short of breath and placed on monitor which showed SR. EMS arrived, EKG was done and it showed SR as well. His V/S were normal. It was not advised for him to go to the ER. He immediately felt better.  He  states that he may have panicked a little last night.  Episode lasted 25-30 mins. He also states that he has not felt well for the last few months. His PCP called this am to tell him that his HGB has dropped to 10.3 from 14.9 10/22 and is trying to get GI appointment asap. He has noted black stools.  He had a stress test 10/6 which was low risk and an echo in September did show progression of aortic stenosis from mild to moderate.   He is in SR today with stable BP. His weight today is 208 lbs and it was 218 lbs in September.    F/u in afib clinic, 12/29/21. He is staying in [place] with stable qt. He did have a GI w/u last fall and was found to have a gastric ulcer. He was placed on a PPI, and oral iron and now CBC has returned to normal. He has not had any afib. No further dyspnea spells as described above.   F/u in afib clinic, 07/12/22. He is getting ready to have Aortic stenosis addressed, pending TAVR,  he is seeing Dr. Cyndia Bent 9/13. He is in SR with bigeminal PVC's today. Tikosyn labs are pending. He takes his metoprolol and his spironolactone at noon and feels wiped out after that. His BP is soft today at 104/56. He  has had PVC's in the past but I have not seen bigeminal PVC's. Qt stable at 480 ms. He is unaware.    Follow up in the AF clinic 09/08/22. Patient underwent TAVR on 07/26/22. He presented to the ED 08/23/22 with abdominal pain and was found to have a small bowel obstruction secondary to an umbilical hernia. He underwent an umbilical hernia repair 01/75/10. His dofetilide was held during admission as he was NPO. Decision was made with EP team to discontinue dofetilide and start amiodarone. He feels well today and remains in SR.  Follow up in the AF clinic 12/07/22. Patient reports that he has done well since his last visit. He remains very active, going to cardiac rehab 3 days weekly and exercising on his own 2-3 other days. He denies any bleeding issues on anticoagulation.   Today, he denies symptoms of palpitations, chest pain, shortness of breath, orthopnea, PND, lower extremity edema, dizziness, presyncope, syncope, or neurologic sequela. The patient is tolerating medications without difficulties and is otherwise without complaint today.   Past Medical History:  Diagnosis Date   Aortic stenosis    Arthritis    Atrial fibrillation (HCC)    BPH (benign prostatic hyperplasia)    Chronic  low back pain    Depression    Diverticulitis    Dyspnea    Elevated PSA    Foot drop, right    Gait disorder 02/03/2015   Hip pain    History of colonoscopy    History of nuclear stress test    Myoview 10/22: EF 67, no ischemia or infarction; low risk   HOH (hard of hearing)    hearing aid   Hypertension    Lumbar radiculopathy    Mitral valve prolapse    Onychomycosis    Peptic ulcer disease    S/P TAVR (transcatheter aortic valve replacement) 07/26/2022   32m S3UR via TF approach with Dr. MAngelena Formand Dr. BRandolm Idol  Sepsis ([facility] 2005   e. coli-after prostate bx   Sleep apnea    Torn ACL    Tracheobronchomalacia    seen on 2019 Chest CT   Past Surgical History:  Procedure Laterality  Date   BIOPSY  10/05/2021   Procedure: BIOPSY;  Surgeon: KRonnette Juniper MD;  [facility];  Service: Gastroenterology;;   CARDIAC CATHETERIZATION     CARDIOVERSION N/A 03/27/2020   Procedure: CARDIOVERSION;  Surgeon: RSkeet Latch MD;  [facility]  Service: Cardiovascular;  Laterality: N/A;   CATARACT EXTRACTION Bilateral    ESOPHAGOGASTRODUODENOSCOPY N/A 08/19/2015   Procedure: ESOPHAGOGASTRODUODENOSCOPY (EGD);  Surgeon: JLaurence Spates MD;  [facility];  Service: Endoscopy;  Laterality: N/A;   ESOPHAGOGASTRODUODENOSCOPY N/A 10/05/2021   Procedure: ESOPHAGOGASTRODUODENOSCOPY (EGD);  Surgeon: KRonnette Juniper MD;  [facility];  Service: Gastroenterology;  Laterality: N/A;   INTRAOPERATIVE TRANSTHORACIC ECHOCARDIOGRAM N/A 07/26/2022   Procedure: INTRAOPERATIVE TRANSTHORACIC ECHOCARDIOGRAM;  Surgeon: MBurnell Blanks MD;  [facility]  Service: Open Heart Surgery;  Laterality: N/A;   right knee replacement Right 1996   RIGHT/LEFT HEART CATH AND CORONARY ANGIOGRAPHY N/A 05/26/2022   Procedure: RIGHT/LEFT HEART CATH AND CORONARY ANGIOGRAPHY;  Surgeon: MBurnell Blanks MD;  [facility];  Service: Cardiovascular;  Laterality: N/A;   SHOULDER SURGERY     TEE WITHOUT CARDIOVERSION N/A 03/27/2020   Procedure: TRANSESOPHAGEAL ECHOCARDIOGRAM (TEE);  Surgeon: RSkeet Latch MD;  [facility] [facility]  Service: Cardiovascular;  Laterality: N/A;   TONSILLECTOMY     TOTAL HIP ARTHROPLASTY Right 08/13/2019   Procedure: RIGHT TOTAL HIP ARTHROPLASTY ANTERIOR APPROACH;  Surgeon: BMcarthur Rossetti MD;  [facility]  Service: Orthopedics;  Laterality: Right;   TRANSCATHETER AORTIC VALVE REPLACEMENT, TRANSFEMORAL N/A 07/26/2022   Procedure: Transcatheter Aortic Valve Replacement, Transfemoral using a 26 MM Edwards SAPIEN 3 Ultra;  Surgeon: MBurnell Blanks MD;  [facility]  Service: Open Heart Surgery;   Laterality: N/A;  Transfemoral approach   UMBILICAL HERNIA REPAIR N/A 08/25/2022   Procedure: UMBILICAL HERNIA REPAIR WITH POSSIBLE MESH;  Surgeon: TAutumn MessingIII, MD;  [facility];  Service: General;  Laterality: N/A;    Current Outpatient Medications  Medication Sig Dispense Refill   acetaminophen (TYLENOL) 500 MG tablet Take 1,000 mg by mouth every 6 (six) hours as needed for moderate pain.     allopurinol (ZYLOPRIM) 100 MG tablet Take 2 tablets (200 mg total) by mouth daily.     amiodarone (PACERONE) 200 MG tablet Take 1 tablet (200 mg total) by mouth daily. 90 tablet 1   amoxicillin (AMOXIL) 500 MG tablet Take 4 tablets (2,000 mg total) by mouth as directed. 1 hour prior to dental work including cleanings 12 tablet 12   apixaban (ELIQUIS) 5 MG  TABS tablet Take 1 tablet (5 mg total) by mouth 2 (two) times daily. 60 tablet 5   atorvastatin (LIPITOR) 10 MG tablet Take 10 mg by mouth every morning.  10   cyanocobalamin (VITAMIN B12) 1000 MCG tablet Take 1,000 mcg by mouth daily.     diltiazem (CARDIZEM CD) 240 MG 24 hr capsule TAKE (1) CAPSULE DAILY. 30 capsule 9   dutasteride (AVODART) 0.5 MG capsule Take 0.5 mg by mouth every morning.  2   furosemide (LASIX) 40 MG tablet Take 1.5 tablets (60 mg total) by mouth 4 (four) times a week. 30 tablet 11   metoprolol tartrate (LOPRESSOR) 25 MG tablet Take 1 tablet (25 mg total) by mouth daily. 90 tablet 3   mirtazapine (REMERON) 30 MG tablet TAKE ONE TABLET AT BEDTIME. 90 tablet 0   Multiple Vitamin (MULTIVITAMIN) tablet Take 1 tablet by mouth daily.     Omega-3 Fatty Acids (FISH OIL) 1200 MG CAPS Take 1,200 mg by mouth daily.     pantoprazole (PROTONIX) 40 MG tablet Take 1 tablet (40 mg total) by mouth daily.     Polyethyl Glycol-Propyl Glycol (SYSTANE) 0.4-0.3 % SOLN Place 1 drop into both eyes 2 (two) times daily.     potassium chloride SA (KLOR-CON M) 20 MEQ tablet TAKE 1 TABLET BY MOUTH IN THE MORNING, TAKE 1/2 TABLET BY MOUTH IN THE P.M.  135 tablet 3   Probiotic Product (PROBIOTIC DAILY PO) Take 1 tablet by mouth daily.     pyridOXINE (VITAMIN B-6) 100 MG tablet Take 100 mg by mouth daily.     spironolactone (ALDACTONE) 25 MG tablet Take 1 tablet (25 mg total) by mouth daily. 90 tablet 2   tamsulosin (FLOMAX) 0.4 MG CAPS capsule Take 0.4 mg by mouth daily after breakfast.  2   Turmeric 500 MG CAPS Take 500 mg by mouth daily.     No current facility-administered medications for this encounter.    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   Patient drinks 1 cup of coffee per day.   Social Determinants of Health   Financial Resource Strain: Not on file  Food Insecurity: No Food Insecurity (08/23/2022)   Hunger Vital Sign    Worried About Running Out of Food in the Last Year: Never true    Ran Out of Food in the Last Year: Never true  Transportation Needs: No Transportation Needs (08/23/2022)  PRAPARE - Hydrologist (Medical): No    Lack of Transportation (Non-Medical): No  Physical Activity: Not on file  Stress: Not on file   Social Connections: Not on file  Intimate Partner Violence: Not At Risk (08/24/2022)   Humiliation, Afraid, Rape, and Kick questionnaire    Fear of Current or Ex-Partner: No    Emotionally Abused: No    Physically Abused: No    Sexually Abused: No    Family History  Problem Relation Age of Onset   Pneumonia Mother 64   Cancer - Lung Father 40   Multiple myeloma Brother 77    ROS- All systems are reviewed and negative except as per the HPI above  Physical Exam: Vitals:   12/07/22 0834  BP: (!) 144/76  Pulse: 66  Weight: 96.1 kg  Height: 5' 8.75" (1.746 m)    Wt Readings from Last 3 Encounters:  12/07/22 96.1 kg  10/11/22 95.7 kg  09/28/22 95.3 kg    Labs: Lab Results  Component Value Date   NA 138 08/24/2022   K 4.2 08/24/2022   CL 108 08/24/2022   CO2 23 08/24/2022   GLUCOSE 94 08/24/2022   BUN 25 (H) 08/24/2022   CREATININE 0.75 08/24/2022   CALCIUM 8.3 (L) 08/24/2022   MG 1.7 07/27/2022   Lab Results  Component Value Date   INR 1.1 07/22/2022   Lab Results  Component Value Date   CHOL 96 03/06/2020   HDL 48 03/06/2020   LDLCALC 38 03/06/2020   TRIG 50 03/06/2020     GEN- The patient is a well appearing elderly male, alert and oriented x 3 today.   HEENT-head normocephalic, atraumatic, sclera clear, conjunctiva pink, hearing intact, trachea midline. Lungs- Clear to ausculation bilaterally, normal work of breathing Heart- Regular rate and rhythm, no rubs or gallops, 1/6 systolic murmur  GI- soft, NT, ND, + BS Extremities- no clubbing, cyanosis, or edema MS- no significant deformity or atrophy Skin- no rash or lesion Psych- euthymic mood, full affect Neuro- strength and sensation are intact   EKG-   SR, 1st degree AV block Vent. rate 66 BPM PR interval 290 ms QRS duration 90 ms QT/QTcB 412/431 ms   Echo 09/28/22  1. Left ventricular ejection fraction, by estimation, is 65 to 70%. The  left ventricle has normal function. The left  ventricle has no regional  wall motion abnormalities. There is moderate asymmetric left ventricular hypertrophy of the basal-septal segment. Left ventricular diastolic parameters are indeterminate.   2. Right ventricular systolic function is normal. The right ventricular  size is normal. Tricuspid regurgitation signal is inadequate for assessing PA pressure.   3. Left atrial size was moderately dilated.   4. Right atrial size was mildly dilated.   5. The mitral valve is degenerative. Bileaflet prolapse. Mild to moderate mitral valve regurgitation. Severe mitral annular calcification.   6. The aortic valve has been repaired/replaced. Aortic valve  regurgitation is mild. There is a 26 mm Edwards Sapien prosthetic (TAVR) valve present in the aortic position. Mild PVL. MG 29mHg, Vmax 2.6 m/s, EOA 1.9 cm^2, DI 0.6   7. The inferior vena cava is normal in size with greater than 50%  respiratory variability, suggesting right atrial pressure of 3 mmHg.    Epic records reviewed   CHA2DS2-VASc Score = 5  The patient's score is based upon: CHF History: 1 HTN History: 1 Diabetes History: 0 Stroke History: 0 Vascular Disease History: 1 Age Score:  2 Gender Score: 0       ASSESSMENT AND PLAN: 1. Persistent Atrial Fibrillation (ICD10:  I48.19) The patient's CHA2DS2-VASc score is 5, indicating a 7.2% annual risk of stroke.   Patient appears to be maintaining SR. Continue amiodarone 200 mg daily Continue Eliquis 5 mg BID Continue metoprolol 25 mg daily Continue diltiazem 240 mg daily  2. Secondary Hypercoagulable State (ICD10:  D68.69) The patient is at significant risk for stroke/thromboembolism based upon his CHA2DS2-VASc Score of 5.  Continue Apixaban (Eliquis).   3. Aortic stenosis  S/p TAVR 07/26/22 Followed by Dr. Angelena Form    4. HTN Stable, no changes today. He reports an occasional low systolic reading after exercise 700-174 systolic. He will keep BP log for review at follow  up.  5. CAD On statin No anginal symptoms.   Follow up with Dr Angelena Form as scheduled. Will refer to establish care with new EP in 6 months. AF clinic in one year.    [facility] [address] [place], [place] [address] [phone]

## 2022-12-08 DIAGNOSIS — G4733 Obstructive sleep apnea (adult) (pediatric): Secondary | ICD-10-CM | POA: Diagnosis not present

## 2022-12-09 ENCOUNTER — Encounter (HOSPITAL_COMMUNITY)
Admission: RE | Admit: 2022-12-09 | Discharge: 2022-12-09 | Disposition: A | Discharge status: Home / Self Care | Payer: Medicare Other | Source: Ambulatory Visit | Attending: Cardiovascular Disease | Admitting: Cardiovascular Disease

## 2022-12-09 DIAGNOSIS — Z952 Presence of prosthetic heart valve: Secondary | ICD-10-CM | POA: Diagnosis not present

## 2022-12-09 DIAGNOSIS — Z48812 Encounter for surgical aftercare following surgery on the circulatory system: Secondary | ICD-10-CM | POA: Diagnosis not present

## 2022-12-12 ENCOUNTER — Other Ambulatory Visit: Payer: Self-pay | Admitting: *Deleted

## 2022-12-12 ENCOUNTER — Encounter (HOSPITAL_COMMUNITY)
Admission: RE | Admit: 2022-12-12 | Discharge: 2022-12-12 | Disposition: A | Discharge status: Home / Self Care | Payer: Medicare Other | Source: Ambulatory Visit | Attending: Cardiovascular Disease | Admitting: Cardiovascular Disease

## 2022-12-12 DIAGNOSIS — Z952 Presence of prosthetic heart valve: Secondary | ICD-10-CM

## 2022-12-12 DIAGNOSIS — I484 Atypical atrial flutter: Secondary | ICD-10-CM

## 2022-12-12 DIAGNOSIS — Z48812 Encounter for surgical aftercare following surgery on the circulatory system: Secondary | ICD-10-CM | POA: Diagnosis not present

## 2022-12-12 MED ORDER — APIXABAN 5 MG PO TABS: 5.0000 mg | ORAL_TABLET | Freq: Two times a day (BID) | ORAL | 1 refills | Status: DC

## 2022-12-12 NOTE — Telephone Encounter (Signed)
Prescription refill request for Eliquis received. Indication: Afib  Last office visit: 12/07/21 Patrick Franco)  Scr: 0.75 (08/24/22)  Age: 89 Weight: 96.1kg  Appropriate dose. Refill sent.

## 2022-12-14 ENCOUNTER — Encounter (HOSPITAL_COMMUNITY)
Admission: RE | Admit: 2022-12-14 | Discharge: 2022-12-14 | Disposition: A | Discharge status: Home / Self Care | Payer: Medicare Other | Source: Ambulatory Visit | Attending: Cardiovascular Disease | Admitting: Cardiovascular Disease

## 2022-12-14 DIAGNOSIS — Z952 Presence of prosthetic heart valve: Secondary | ICD-10-CM | POA: Diagnosis not present

## 2022-12-14 DIAGNOSIS — Z48812 Encounter for surgical aftercare following surgery on the circulatory system: Secondary | ICD-10-CM | POA: Diagnosis not present

## 2022-12-14 NOTE — Progress Notes (Signed)
Cardiac Individual Treatment Plan  Patient Details  Name: Patrick Franco MRN: [id number] Date of Birth: 03/03/1932 Referring Provider:   Flowsheet Row INTENSIVE CARDIAC REHAB ORIENT from 10/11/2022 in [facility] [place]  Referring Provider Patrick Chandler, MD       Initial Encounter Date:  [place] from 10/11/2022 in [facility]  Date 10/11/22       Visit Diagnosis: No diagnosis found.  Patient's Home Medications on Admission:  Current Outpatient Medications:    acetaminophen (TYLENOL) 500 MG tablet, Take 1,000 mg by mouth every 6 (six) hours as needed for moderate pain., Disp: , Rfl:    allopurinol (ZYLOPRIM) 100 MG tablet, Take 2 tablets (200 mg total) by mouth daily., Disp: , Rfl:    amiodarone (PACERONE) 200 MG tablet, Take 1 tablet (200 mg total) by mouth daily., Disp: 90 tablet, Rfl: 1   amoxicillin (AMOXIL) 500 MG tablet, Take 4 tablets (2,000 mg total) by mouth as directed. 1 hour prior to dental work including cleanings, Disp: 12 tablet, Rfl: 12   apixaban (ELIQUIS) 5 MG TABS tablet, Take 1 tablet (5 mg total) by mouth 2 (two) times daily., Disp: 180 tablet, Rfl: 1   atorvastatin (LIPITOR) 10 MG tablet, Take 10 mg by mouth every morning., Disp: , Rfl: 10   cyanocobalamin (VITAMIN B12) 1000 MCG tablet, Take 1,000 mcg by mouth daily., Disp: , Rfl:    diltiazem (CARDIZEM CD) 240 MG 24 hr capsule, TAKE (1) CAPSULE DAILY., Disp: 30 capsule, Rfl: 9   dutasteride (AVODART) 0.5 MG capsule, Take 0.5 mg by mouth every morning., Disp: , Rfl: 2   furosemide (LASIX) 40 MG tablet, Take 1.5 tablets (60 mg total) by mouth 4 (four) times a week., Disp: 30 tablet, Rfl: 11   metoprolol tartrate (LOPRESSOR) 25 MG tablet, Take 1 tablet (25 mg total) by mouth daily., Disp: 90 tablet, Rfl: 3   mirtazapine (REMERON) 30 MG tablet, TAKE ONE TABLET AT  BEDTIME., Disp: 90 tablet, Rfl: 0   Multiple Vitamin (MULTIVITAMIN) tablet, Take 1 tablet by mouth daily., Disp: , Rfl:    Omega-3 Fatty Acids (FISH OIL) 1200 MG CAPS, Take 1,200 mg by mouth daily., Disp: , Rfl:    pantoprazole (PROTONIX) 40 MG tablet, Take 1 tablet (40 mg total) by mouth daily., Disp: , Rfl:    Polyethyl Glycol-Propyl Glycol (SYSTANE) 0.4-0.3 % SOLN, Place 1 drop into both eyes 2 (two) times daily., Disp: , Rfl:    potassium chloride SA (KLOR-CON M) 20 MEQ tablet, TAKE 1 TABLET BY MOUTH IN THE MORNING, TAKE 1/2 TABLET BY MOUTH IN THE P.M., Disp: 135 tablet, Rfl: 3   Probiotic Product (PROBIOTIC DAILY PO), Take 1 tablet by mouth daily., Disp: , Rfl:    pyridOXINE (VITAMIN B-6) 100 MG tablet, Take 100 mg by mouth daily., Disp: , Rfl:    spironolactone (ALDACTONE) 25 MG tablet, Take 1 tablet (25 mg total) by mouth daily., Disp: 90 tablet, Rfl: 2   tamsulosin (FLOMAX) 0.4 MG CAPS capsule, Take 0.4 mg by mouth daily after breakfast., Disp: , Rfl: 2   Turmeric 500 MG CAPS, Take 500 mg by mouth daily., Disp: , Rfl:   Past Medical History: Past Medical History:  Diagnosis Date   Aortic stenosis    Arthritis    Atrial fibrillation (HCC)    BPH (benign prostatic hyperplasia)    Chronic low  back pain    Depression    Diverticulitis    Dyspnea    Elevated PSA    Foot drop, right    Gait disorder 02/03/2015   Hip pain    History of colonoscopy    History of nuclear stress test    Myoview 10/22: EF 67, no ischemia or infarction; low risk   HOH (hard of hearing)    hearing aid   Hypertension    Lumbar radiculopathy    Mitral valve prolapse    Onychomycosis    Peptic ulcer disease    S/P TAVR (transcatheter aortic valve replacement) 07/26/2022   29m S3UR via TF approach with Dr. MAngelena Formand Dr. BRandolm Idol  Sepsis ([facility] 2005   e. coli-after prostate bx   Sleep apnea    Torn ACL    Tracheobronchomalacia    seen on 2019 Chest CT    Tobacco Use: Social History    Tobacco Use  Smoking Status Former   Packs/day: 0.50   Types: Cigarettes   Start date: 177  Quit date: 1961   Years since quitting: 63.1  Smokeless Tobacco Never  Tobacco Comments   Former smoker  Pt smoked for 638monthwhen in [employer] 12/07/22    Labs: Review Flowsheet       Latest Ref Rng & Units 03/06/2020 05/26/2022 07/26/2022  Labs for ITP Cardiac and Pulmonary Rehab  Cholestrol 0 - 200 mg/dL 96  - -  LDL (calc) 0 - 99 mg/dL 38  - -  HDL-C >40 mg/dL 48  - -  Trlycerides <150 mg/dL 50  - -  PH, Arterial 7.35 - 7.45 - 7.432  -  PCO2 arterial 32 - 48 mmHg - 34.5  -  Bicarbonate 20.0 - 28.0 mmol/L - 23.0  -  TCO2 22 - 32 mmol/L - '24  20  15  23  23   '$ Acid-base deficit 0.0 - 2.0 mmol/L - 1.0  -  O2 Saturation % - 97  -    Capillary Blood Glucose: Lab Results  Component Value Date   GLUCAP 172 (H) 07/27/2022   GLUCAP 94 07/26/2022     Exercise Target Goals: Exercise Program Goal: Individual exercise prescription set using results from initial 6 min walk test and THRR while considering  patient's activity barriers and safety.   Exercise Prescription Goal: Initial exercise prescription builds to 30-45 minutes a day of aerobic activity, 2-3 days per week.  Home exercise guidelines will be given to patient during program as part of exercise prescription that the participant will acknowledge.  Activity Barriers & Risk Stratification:  Activity Barriers & Cardiac Risk Stratification - 10/11/22 1001       Activity Barriers & Cardiac Risk Stratification   Activity Barriers Arthritis;Back Problems;Neck/Spine Problems;Joint Problems;Deconditioning;Muscular Weakness;Balance Concerns;History of Falls    Cardiac Risk Stratification High             6 Minute Walk:  6 Minute Walk     Row Name 10/11/22 1000         6 Minute Walk   Phase Initial     Distance 1005 feet  Used GOCART for balance concerns     Walk Time 6 minutes     # of Rest Breaks 0     MPH 1.9      METS 1.42     RPE 11     Perceived Dyspnea  0     VO2 Peak 5     Symptoms No  Resting HR 65 bpm     Resting BP 108/74     Resting Oxygen Saturation  96 %     Exercise Oxygen Saturation  during 6 min walk 93 %     Max Ex. HR 101 bpm     Max Ex. BP 152/72     2 Minute Post BP 110/70              Oxygen Initial Assessment:   Oxygen Re-Evaluation:   Oxygen Discharge (Final Oxygen Re-Evaluation):   Initial Exercise Prescription:  Initial Exercise Prescription - 10/11/22 1000       Date of Initial Exercise RX and Referring Provider   Date 10/11/22    Referring Provider Patrick Chandler, MD    Expected Discharge Date 12/09/22      NuStep   Level 1    SPM 75    Minutes 30    METs 1.5      Prescription Details   Frequency (times per week) 3    Duration Progress to 30 minutes of continuous aerobic without signs/symptoms of physical distress      Intensity   THRR 40-80% of Max Heartrate 53-106    Ratings of Perceived Exertion 11-13    Perceived Dyspnea 0-4      Progression   Progression Continue progressive overload as per policy without signs/symptoms or physical distress.      Resistance Training   Training Prescription Yes    Weight 3 lbs    Reps 10-15             Perform Capillary Blood Glucose checks as needed.  Exercise Prescription Changes:   Exercise Prescription Changes     Row Name 10/21/22 1200 11/02/22 1100 11/23/22 1200         Response to Exercise   Blood Pressure (Admit) 130/66 136/78 116/62     Blood Pressure (Exercise) 148/72 142/72 140/70     Blood Pressure (Exit) 110/70 100/62 108/64     Heart Rate (Admit) 62 bpm 71 bpm 59 bpm     Heart Rate (Exercise) 98 bpm 105 bpm 92 bpm     Heart Rate (Exit) 69 bpm 76 bpm 63 bpm     Rating of Perceived Exertion (Exercise) '10 12 11     '$ Symptoms None None None     Comments Pt's first day in the CRP2 program Reviewed METs, goals and Home exercise Rx Reviewed METs/goals     Duration  Continue with 30 min of aerobic exercise without signs/symptoms of physical distress. Continue with 30 min of aerobic exercise without signs/symptoms of physical distress. Continue with 30 min of aerobic exercise without signs/symptoms of physical distress.     Intensity THRR unchanged THRR unchanged THRR unchanged       Progression   Progression Continue to progress workloads to maintain intensity without signs/symptoms of physical distress. Continue to progress workloads to maintain intensity without signs/symptoms of physical distress. Continue to progress workloads to maintain intensity without signs/symptoms of physical distress.     Average METs 2.5 3.4 3.7       Resistance Training   Training Prescription Yes No No     Weight 3 lbs No weigts on Wednesdays No weigts on Wednesdays     Reps 10-15 -- --     Time 10 Minutes -- --       Interval Training   Interval Training No No No       NuStep   Level 1 2  3     SPM 95 112 112     Minutes '30 30 30     '$ METs 2.5 3.4 3.7       Home Exercise Plan   Plans to continue exercise at -- Home (comment) Home (comment)     Frequency -- Add 2 additional days to program exercise sessions. Add 2 additional days to program exercise sessions.     Initial Home Exercises Provided -- 11/02/22 11/02/22              Exercise Comments:   Exercise Comments     Row Name 10/21/22 1200 11/02/22 1159 11/23/22 1223       Exercise Comments Pt's first day in the CRP2 program. Pt tolerated session well with no complaints. Reviewed METs, home exerercise Rx, and goals. Pt verbalized understanding of the home exercise RX and was provided a copy. Reviewed METs and goals. Pt is progressing. Pt increase workload on Nsutep today.              Exercise Goals and Review:   Exercise Goals     Row Name 10/11/22 1002             Exercise Goals   Increase Physical Activity Yes       Intervention Provide advice, education, support and counseling about  physical activity/exercise needs.;Develop an individualized exercise prescription for aerobic and resistive training based on initial evaluation findings, risk stratification, comorbidities and participant's personal goals.       Expected Outcomes Short Term: Attend rehab on a regular basis to increase amount of physical activity.;Long Term: Add in home exercise to make exercise part of routine and to increase amount of physical activity.;Long Term: Exercising regularly at least 3-5 days a week.       Increase Strength and Stamina Yes       Intervention Develop an individualized exercise prescription for aerobic and resistive training based on initial evaluation findings, risk stratification, comorbidities and participant's personal goals.;Provide advice, education, support and counseling about physical activity/exercise needs.       Expected Outcomes Short Term: Increase workloads from initial exercise prescription for resistance, speed, and METs.;Short Term: Perform resistance training exercises routinely during rehab and add in resistance training at home;Long Term: Improve cardiorespiratory fitness, muscular endurance and strength as measured by increased METs and functional capacity (6MWT)       Able to understand and use rate of perceived exertion (RPE) scale Yes       Intervention Provide education and explanation on how to use RPE scale       Expected Outcomes Short Term: Able to use RPE daily in rehab to express subjective intensity level;Long Term:  Able to use RPE to guide intensity level when exercising independently       Knowledge and understanding of Target Heart Rate Range (THRR) Yes       Intervention Provide education and explanation of THRR including how the numbers were predicted and where they are located for reference       Expected Outcomes Short Term: Able to state/look up THRR;Short Term: Able to use daily as guideline for intensity in rehab;Long Term: Able to use THRR to govern  intensity when exercising independently       Understanding of Exercise Prescription Yes       Intervention Provide education, explanation, and written materials on patient's individual exercise prescription       Expected Outcomes Short Term: Able to explain program exercise prescription;Long Term:  Able to explain home exercise prescription to exercise independently                Exercise Goals Re-Evaluation :  Exercise Goals Re-Evaluation     Row Name 10/21/22 1200 11/02/22 1153 11/23/22 1222         Exercise Goal Re-Evaluation   Exercise Goals Review Increase Physical Activity;Increase Strength and Stamina;Able to understand and use rate of perceived exertion (RPE) scale;Knowledge and understanding of Target Heart Rate Range (THRR);Understanding of Exercise Prescription Increase Physical Activity;Increase Strength and Stamina;Able to understand and use rate of perceived exertion (RPE) scale;Knowledge and understanding of Target Heart Rate Range (THRR);Understanding of Exercise Prescription Increase Physical Activity;Increase Strength and Stamina;Able to understand and use rate of perceived exertion (RPE) scale;Knowledge and understanding of Target Heart Rate Range (THRR);Understanding of Exercise Prescription     Comments Pt's first day in the CRP2 program. Pt understands the exercise Rx, THRR, and RPE scale. Reviewed METs, goals, and home exercise Rx. Pt is exercising in the pool 1 day a week, and doing stretching and Pilates with a trainer. Pt goal of weight loss has not seen any significant progress. Pt does voice that he has more stamina and strength, especially in his legs which was a goal. Reviewed METs, goals, and home exercise Rx. Pt is exercising in the pool 1 day a week, and doing stretching and Pilates with a trainer. Pt does voice that he has more stamina and strength, especially in his legs which was a goal. He also voices that he is less "winded" with exercise and he trainer  also also commented to him about his improvement.     Expected Outcomes Will continue to montior patient and progress exercise workloads as tolerated. Will continue to montior patient and progress exercise workloads as tolerated. Will continue to montior patient and progress exercise workloads as tolerated.              Discharge Exercise Prescription (Final Exercise Prescription Changes):  Exercise Prescription Changes - 11/23/22 1200       Response to Exercise   Blood Pressure (Admit) 116/62    Blood Pressure (Exercise) 140/70    Blood Pressure (Exit) 108/64    Heart Rate (Admit) 59 bpm    Heart Rate (Exercise) 92 bpm    Heart Rate (Exit) 63 bpm    Rating of Perceived Exertion (Exercise) 11    Symptoms None    Comments Reviewed METs/goals    Duration Continue with 30 min of aerobic exercise without signs/symptoms of physical distress.    Intensity THRR unchanged      Progression   Progression Continue to progress workloads to maintain intensity without signs/symptoms of physical distress.    Average METs 3.7      Resistance Training   Training Prescription No    Weight No weigts on Wednesdays      Interval Training   Interval Training No      NuStep   Level 3    SPM 112    Minutes 30    METs 3.7      Home Exercise Plan   Plans to continue exercise at Home (comment)    Frequency Add 2 additional days to program exercise sessions.    Initial Home Exercises Provided 11/02/22             Nutrition:  Target Goals: Understanding of nutrition guidelines, daily intake of sodium '1500mg'$ , cholesterol '200mg'$ , calories 30% from fat and 7% or less from saturated  fats, daily to have 5 or more servings of fruits and vegetables.  Biometrics:  Pre Biometrics - 10/11/22 0800       Pre Biometrics   Waist Circumference 44 inches    Hip Circumference 45 inches    Waist to Hip Ratio 0.98 %    Triceps Skinfold 17 mm    % Body Fat 31.9 %    Grip Strength 30 kg     Flexibility --   Not perfomed; significant back issues   Single Leg Stand 1 seconds              Nutrition Therapy Plan and Nutrition Goals:  Nutrition Therapy & Goals - 12/13/22 1117       Nutrition Therapy   Diet Heart Healthy Diet    Drug/Food Interactions Statins/Certain Fruits      Personal Nutrition Goals   Nutrition Goal Patient to identify strategies for reducing cardiovascular risk by attending the weekly [facility] education and nutrition series.    Personal Goal #2 Patient to limit sodium intake to '1500mg'$  per day    Personal Goal #3 Patient to improve diet quality by using the plate method as a guide for meal planning to include lean protein/plant protien, fruits, vegetables, whole grains, and nonfat dairy as part of heart healthy lifestyle    Comments Patrick Franco is pre-comtemplative of nutrition changes. He lives at [facility] and all of his meals are provided there; he does continue to enjoy a wide variety of foods. He continues to report interest in reducing wine intake to <2 glasses per day; however, he has been to slow to adopt changes with alcohol intake. He continues to attend the [facility] education series regularly. He is down 1.8# since starting with our program. Patrick Franco will continue to benefit from adherance to the [facility]rit[facility]in eating plan and participation in intensive cardiac rehab for nutrition, exercise, and lifestyle modification      Intervention Plan   Intervention Prescribe, educate and counsel regarding individualized specific dietary modifications aiming towards targeted core components such as weight, hypertension, lipid management, diabetes, heart failure and other comorbidities.;Nutrition handout(s) given to patient.    Expected Outcomes Short Term Goal: Understand basic principles of dietary content, such as calories, fat, sodium, cholesterol and nutrients.;Long Term Goal: Adherence to prescribed nutrition plan.             Nutrition  Assessments:  Nutrition Assessments - 10/19/22 1026       Rate Your Plate Scores   Pre Score 57            MEDIFICTS Score Key: ?70 Need to make dietary changes  40-70 Heart Healthy Diet ? 40 Therapeutic Level Cholesterol Diet   Flowsheet Row INTENSIVE CARDIAC REHAB from 10/19/2022 in [facility]  Picture Your Plate Total Score on Admission 57      Picture Your Plate Scores: <40 Unhealthy dietary pattern with much room for improvement. 41-50 Dietary pattern unlikely to meet recommendations for good health and room for improvement. 51-60 More healthful dietary pattern, with some room for improvement.  >60 Healthy dietary pattern, although there may be some specific behaviors that could be improved.    Nutrition Goals Re-Evaluation:  Nutrition Goals Re-Evaluation     [place] Name 10/19/22 1014 11/16/22 0804 12/13/22 1117         Goals   Current Weight 210 lb 15.7 oz (95.7 kg)  orientation weight 215 lb 9.8 oz (97.8 kg)  209 lb 3.5 oz (94.9 kg)     Nutrition Goal Patient to identify strategies for reducing cardiovascular risk by attending the weekly [facility] education and nutrition series. -- --     Comment -- completed umbilical hernia repair 95/6387. No new labs; most recent labs show lipids WNL no new labs at this time; lipids WNL, continues to be compliant with CPAP     Expected Outcome Patrick Franco reports motivation to make lifestyle changes to aid with heart health. He reports over consuming salt and regular alcohol intake. Patrick Franco will benefit from adhereance to the Pritikin eating plan to support decrease sodium intake of '1500mg'$  daily and increased dietary fiber >30-38g fiber daily. He is receptive to reducing alcohol and sugar intake. Devontre is pre-comtemplative of nutrition changes. He lives at [facility] and all of his meals are provided there; he does continue to enjoy a wide variety of foods. He continues to  report interest in reducing wine intake to <2 glasses per day; however, he has been to slow to adopt changes and continues up to 4-5 glasses daily. He continues to attend the [facility] education series regularly. He is up 4.6# since starting with our program. Taichi will continue to participate in [place] for nutrition, exercise, and lifestyle modification. Olivier is pre-comtemplative of nutrition changes. He lives at [facility] assisted [facility] and all of his meals are provided there; he does continue to enjoy a wide variety of foods. He continues to report interest in reducing wine intake to <2 glasses per day; however, he has been to slow to adopt changes with alcohol intake. He continues to attend the [facility] education series regularly. He is down 1.8# since starting with our program. Vallie will continue to benefit from adherance to the Pritikin eating plan and participation in intensive cardiac rehab for nutrition, exercise, and lifestyle modification.              Nutrition Goals Re-Evaluation:  Nutrition Goals Re-Evaluation     [place] Name 10/19/22 1014 11/16/22 0804 12/13/22 1117         Goals   Current Weight 210 lb 15.7 oz (95.7 kg)  orientation weight 215 lb 9.8 oz (97.8 kg) 209 lb 3.5 oz (94.9 kg)     Nutrition Goal Patient to identify strategies for reducing cardiovascular risk by attending the weekly [facility]in education and nutrition series. -- --     Comment -- completed umbilical hernia repair 56/4332. No new labs; most recent labs show lipids WNL no new labs at this time; lipids WNL, continues to be compliant with CPAP     Expected Outcome Taiwo reports motivation to make lifestyle changes to aid with heart health. He reports over consuming salt and regular alcohol intake. Patrick Franco will benefit from adhereance to the Pritikin eating plan to support decrease sodium intake of '1500mg'$  daily and increased dietary fiber >30-38g fiber daily. He is receptive to reducing alcohol and sugar intake. Patrick Franco is  pre-comtemplative of nutrition changes. He lives at [facility] assisted [facility] and all of his meals are provided there; he does continue to enjoy a wide variety of foods. He continues to report interest in reducing wine intake to <2 glasses per day; however, he has been to slow to adopt changes and continues up to 4-5 glasses daily. He continues to attend the [facility] education series regularly. He is up 4.6# since starting with our program. Patrick Franco will continue to participate in [place] for nutrition, exercise, and lifestyle modification. Patrick Franco is pre-comtemplative of nutrition changes.  He lives at [facility] assisted living and all of his meals are provided there; he does continue to enjoy a wide variety of foods. He continues to report interest in reducing wine intake to <2 glasses per day; however, he has been to slow to adopt changes with alcohol intake. He continues to attend the [facility] education series regularly. He is down 1.8# since starting with our program. Patrick Franco will continue to benefit from adherance to the Pritikin eating plan and participation in intensive cardiac rehab for nutrition, exercise, and lifestyle modification.              Nutrition Goals Discharge (Final Nutrition Goals Re-Evaluation):  Nutrition Goals Re-Evaluation - 12/13/22 1117       Goals   Current Weight 209 lb 3.5 oz (94.9 kg)    Comment no new labs at this time; lipids WNL, continues to be compliant with CPAP    Expected Outcome Patrick Franco is pre-comtemplative of nutrition changes. He lives at [facility] assisted [facility] and all of his meals are provided there; he does continue to enjoy a wide variety of foods. He continues to report interest in reducing wine intake to <2 glasses per day; however, he has been to slow to adopt changes with alcohol intake. He continues to attend the [facility] education series regularly. He is down 1.8# since starting with our program. Patrick Franco will continue to benefit from adherance to the Pritikin  eating plan and participation in intensive cardiac rehab for nutrition, exercise, and lifestyle modification.             Psychosocial: Target Goals: Acknowledge presence or absence of significant depression and/or stress, maximize coping skills, provide positive support system. Participant is able to verbalize types and ability to use techniques and skills needed for reducing stress and depression.  Initial Review & Psychosocial Screening:  Initial Psych Review & Screening - 10/11/22 0851       Initial Review   Current issues with None Identified;History of Depression;Current Stress Concerns   Patrick Franco denies being depressed currently   Source of Stress Concerns Chronic Illness;Unable to participate in former interests or hobbies;Unable to perform yard/household activities      [place]? Yes   Patrick Franco has his wife for support     Barriers   Psychosocial barriers to participate in program There are no identifiable barriers or psychosocial needs.      Screening Interventions   Interventions Encouraged to exercise;Provide feedback about the scores to participant    Expected Outcomes Long Term Goal: Stressors or current issues are controlled or eliminated.             Quality of Life Scores:  Quality of Life - 10/11/22 0956       Quality of Life   Select Quality of Life      Quality of Life Scores   Health/Function Pre 19.83 %    Socioeconomic Pre 28.33 %    Psych/Spiritual Pre 20.93 %    Family Pre 27 %    GLOBAL Pre 22.7 %            Scores of 19 and below usually indicate a poorer quality of life in these areas.  A difference of  2-3 points is a clinically meaningful difference.  A difference of 2-3 points in the total score of the Quality of Life Index has been associated with significant improvement in overall quality of life, self-image, physical symptoms, and general health in studies assessing  change in quality of  life.  PHQ-9: Review Flowsheet       10/11/2022  Depression screen PHQ 2/9  Decreased Interest 0  Down, Depressed, Hopeless 0  PHQ - 2 Score 0   Interpretation of Total Score  Total Score Depression Severity:  1-4 = Minimal depression, 5-9 = Mild depression, 10-14 = Moderate depression, 15-19 = Moderately severe depression, 20-27 = Severe depression   Psychosocial Evaluation and Intervention:   Psychosocial Re-Evaluation:  Psychosocial Re-Evaluation     Row Name 11/15/22 1412 12/14/22 0942           Psychosocial Re-Evaluation   Current issues with None Identified None Identified      Interventions Encouraged to attend Cardiac Rehabilitation for the exercise Encouraged to attend Cardiac Rehabilitation for the exercise      Continue Psychosocial Services  No Follow up required No Follow up required               Psychosocial Discharge (Final Psychosocial Re-Evaluation):  Psychosocial Re-Evaluation - 12/14/22 0942       Psychosocial Re-Evaluation   Current issues with None Identified    Interventions Encouraged to attend Cardiac Rehabilitation for the exercise    Continue Psychosocial Services  No Follow up required             Vocational Rehabilitation: Provide vocational rehab assistance to qualifying candidates.   Vocational Rehab Evaluation & Intervention:  Vocational Rehab - 10/11/22 4854       Initial Vocational Rehab Evaluation & Intervention   Assessment shows need for Vocational Rehabilitation No   Patrick Franco is retired and does not need vocational rehab at this time            Education: Education Goals: Education classes will be provided on a weekly basis, covering required topics. Participant will state understanding/return demonstration of topics presented.    Education     Row Name 10/19/22 1000     Education   Cardiac Education Topics [employer]   Educator Dietitian   Weekly Topic One-Pot  Wonders   Instruction Review Code 1- Verbalizes Understanding   Class Start Time [phone]   Class Stop Time 0900   Class Time Calculation (min) 45 min    Row Name 10/21/22 1000     Education   Cardiac Education Topics Pritikin   Select Core Videos     Core Videos   Educator Dietitian   Select Nutrition   Nutrition Dining Out - Part 1   Instruction Review Code 1- Verbalizes Understanding   Class Start Time 0815   Class Stop Time 0903   Class Time Calculation (min) 48 min    Row Name 10/24/22 0900     Education   Cardiac Education Topics Pritikin   Select Core Videos     Core Videos   Educator Exercise Physiologist   Select Exercise Education   Exercise Education Biomechanial Limitations   Instruction Review Code 1- Verbalizes Understanding   Class Start Time [phone]   Class Stop Time 0901   Class Time Calculation (min) 49 min    [place] Name 10/26/22 1200     Education   Cardiac Education Topics Pritikin   Financial trader   Weekly Topic Fast Evening Meals  [place] [employer]   Instruction Review Code 1- Verbalizes Understanding   Class Start Time [phone]   Class Stop  Time 0851   Class Time Calculation (min) 41 min    Row Name 10/28/22 1500     Education   Cardiac Education Topics Pritikin   Select Core Videos     Core Videos   Educator Exercise Physiologist   Select Psychosocial   Psychosocial How Our Thoughts Can Heal Our Hearts   Instruction Review Code 1- Verbalizes Understanding   Class Start Time 0805   Class Stop Time 0850   Class Time Calculation (min) 45 min    [place] Name 10/31/22 0900     Education   Cardiac Education Topics Pritikin   IT sales professional Nutrition   Nutrition Workshop Fueling a Designer, multimedia   Instruction Review Code 1- Verbalizes Understanding   Class Start Time 0815   Class Stop Time 0901   Class Time Calculation (min) 46 min    [place] Name  11/02/22 1200     Education   Cardiac Education Topics Pritikin   Financial trader   Weekly Topic International Cuisine- Spotlight on the [facility] Zones   Instruction Review Code 1- Verbalizes Understanding   Class Start Time 0815   Class Stop Time [phone]   Class Time Calculation (min) 37 min    [place] Name 11/09/22 1200     Education   Cardiac Education Topics Pritikin   Scientist, research (life sciences)   Educator Exercise Physiologist   Select Nutrition   Nutrition Cooking - Healthy Salads and Dressing   Instruction Review Code 1- Verbalizes Understanding   Class Start Time 0803   Class Stop Time [phone]   Class Time Calculation (min) 39 min    [place] Name 11/16/22 1000     Education   Cardiac Education Topics [employer]   Educator Dietitian   Weekly Topic Fast and Healthy Breakfasts   Instruction Review Code 1- Verbalizes Understanding   Class Start Time 0815   Class Stop Time 4970   Class Time Calculation (min) 40 min    [place] Name 11/18/22 1100     Education   Cardiac Education Topics Pritikin   Select Core Videos     Core Videos   Educator Dietitian   Select Nutrition   Nutrition Overview of the [place]   Instruction Review Code 1- Verbalizes Understanding   Class Start Time 0815   Class Stop Time 0902   Class Time Calculation (min) 47 min    [place] Name 11/21/22 0800     Education   Cardiac Education Topics Pritikin   Select Workshops     Workshops   Educator Exercise Physiologist   Select Exercise   Exercise Workshop Hotel manager and Fall Prevention   Instruction Review Code 1- Verbalizes Understanding   Class Start Time 0802   Class Stop Time 0834   Class Time Calculation (min) 32 min    [place] Name 11/23/22 1000     Education   Cardiac Education Topics [employer]   Educator Dietitian   Weekly Topic  Personalizing Your Pritikin Plate   Instruction Review Code 1- Verbalizes Understanding   Class Start Time 0815   Class Stop Time 2637   Class Time Calculation (min) 40 min    [place] Name 12/05/22 [phone]  Education   Cardiac Education Topics Pritikin   Environmental consultant Psychosocial   Psychosocial Workshop Other  Focused goals, sustainable changes   Instruction Review Code 1- Verbalizes Understanding   Class Start Time 0813   Class Stop Time 0857   Class Time Calculation (min) 44 min    [place] Name 12/07/22 1600     Education   Cardiac Education Topics [employer]   Educator Dietitian   Weekly Topic Tasty Appetizers and Snacks   Instruction Review Code 1- Verbalizes Understanding   Class Start Time 1402   Class Stop Time 1448   Class Time Calculation (min) 46 min    [place] Name 12/09/22 1200     Education   Cardiac Education Topics Pritikin   Select Core Videos     Core Videos   Educator Dietitian   Select Nutrition   Nutrition Calorie Density   Instruction Review Code 1- Verbalizes Understanding   Class Start Time 0810   Class Stop Time 0853   Class Time Calculation (min) 43 min    [place] Name 12/12/22 0900     Education   Cardiac Education Topics Pritikin   Select Workshops     Workshops   Educator Exercise Physiologist   Select Exercise   Exercise Workshop Exercise Basics: Building Your Action Plan   Instruction Review Code 1- Verbalizes Understanding   Class Start Time 0805   Class Stop Time 0858   Class Time Calculation (min) 53 min            Core Videos: Exercise    Move It!  Clinical staff conducted group or individual video education with verbal and written material and guidebook.  Patient learns the recommended Pritikin exercise program. Exercise with the goal of living a long, healthy life. Some of the health benefits of exercise include controlled  diabetes, healthier blood pressure levels, improved cholesterol levels, improved heart and lung capacity, improved sleep, and better body composition. Everyone should speak with their doctor before starting or changing an exercise routine.  Biomechanical Limitations Clinical staff conducted group or individual video education with verbal and written material and guidebook.  Patient learns how biomechanical limitations can impact exercise and how we can mitigate and possibly overcome limitations to have an impactful and balanced exercise routine.  Body Composition Clinical staff conducted group or individual video education with verbal and written material and guidebook.  Patient learns that body composition (ratio of muscle mass to fat mass) is a key component to assessing overall fitness, rather than body weight alone. Increased fat mass, especially visceral belly fat, can put [place] at increased risk for metabolic syndrome, type 2 diabetes, heart disease, and even death. It is recommended to combine diet and exercise (cardiovascular and resistance training) to improve your body composition. Seek guidance from your physician and exercise physiologist before implementing an exercise routine.  Exercise Action Plan Clinical staff conducted group or individual video education with verbal and written material and guidebook.  Patient learns the recommended strategies to achieve and enjoy long-term exercise adherence, including variety, self-motivation, self-efficacy, and positive decision making. Benefits of exercise include fitness, good health, weight management, more energy, better sleep, less stress, and overall well-being.  Medical   Heart Disease Risk Reduction Clinical staff conducted group or individual video education with verbal and written material and guidebook.  Patient learns our heart is  our most vital organ as it circulates oxygen, nutrients, white blood cells, and hormones throughout the  entire body, and carries waste away. Data supports a plant-based eating plan like the Pritikin Program for its effectiveness in slowing progression of and reversing heart disease. The video provides a number of recommendations to address heart disease.   Metabolic Syndrome and Belly Fat  Clinical staff conducted group or individual video education with verbal and written material and guidebook.  Patient learns what metabolic syndrome is, how it leads to heart disease, and how one can reverse it and keep it from coming back. You have metabolic syndrome if you have 3 of the following 5 criteria: abdominal obesity, high blood pressure, high triglycerides, low HDL cholesterol, and high blood sugar.  Hypertension and Heart Disease Clinical staff conducted group or individual video education with verbal and written material and guidebook.  Patient learns that high blood pressure, or hypertension, is very common in the [place]. Hypertension is largely due to excessive salt intake, but other important risk factors include being overweight, physical inactivity, drinking too much alcohol, smoking, and not eating enough potassium from fruits and vegetables. High blood pressure is a leading risk factor for heart attack, stroke, congestive heart failure, dementia, kidney failure, and premature death. Long-term effects of excessive salt intake include stiffening of the arteries and thickening of heart muscle and organ damage. Recommendations include ways to reduce hypertension and the risk of heart disease.  Diseases of Our Time - Focusing on Diabetes Clinical staff conducted group or individual video education with verbal and written material and guidebook.  Patient learns why the best way to stop diseases of our time is prevention, through food and other lifestyle changes. Medicine (such as prescription pills and surgeries) is often only a Band-Aid on the problem, not a long-term solution. Most common diseases  of our time include obesity, type 2 diabetes, hypertension, heart disease, and cancer. The Pritikin Program is recommended and has been proven to help reduce, reverse, and/or prevent the damaging effects of metabolic syndrome.  Nutrition   Overview of the Pritikin Eating Plan  Clinical staff conducted group or individual video education with verbal and written material and guidebook.  Patient learns about the [place] for disease risk reduction. The [place] emphasizes a wide variety of unrefined, minimally-processed carbohydrates, like fruits, vegetables, whole grains, and legumes. Go, Caution, and Stop food choices are explained. Plant-based and lean animal proteins are emphasized. Rationale provided for low sodium intake for blood pressure control, low added sugars for blood sugar stabilization, and low added fats and oils for coronary artery disease risk reduction and weight management.  Calorie Density  Clinical staff conducted group or individual video education with verbal and written material and guidebook.  Patient learns about calorie density and how it impacts the Pritikin Eating Plan. Knowing the characteristics of the food you choose will help you decide whether those foods will lead to weight gain or weight loss, and whether you want to consume more or less of them. Weight loss is usually a side effect of the Pritikin Eating Plan because of its focus on low calorie-dense foods.  Label Reading  Clinical staff conducted group or individual video education with verbal and written material and guidebook.  Patient learns about the Pritikin recommended label reading guidelines and corresponding recommendations regarding calorie density, added sugars, sodium content, and whole grains.  Dining Out - Part 1  Clinical staff conducted group or individual video education  with verbal and written material and guidebook.  Patient learns that restaurant meals can be sabotaging  because they can be so high in calories, fat, sodium, and/or sugar. Patient learns recommended strategies on how to positively address this and avoid unhealthy pitfalls.  Facts on Fats  Clinical staff conducted group or individual video education with verbal and written material and guidebook.  Patient learns that lifestyle modifications can be just as effective, if not more so, as many medications for lowering your risk of heart disease. A Pritikin lifestyle can help to reduce your risk of inflammation and atherosclerosis (cholesterol build-up, or plaque, in the artery walls). Lifestyle interventions such as dietary choices and physical activity address the cause of atherosclerosis. A review of the types of fats and their impact on blood cholesterol levels, along with dietary recommendations to reduce fat intake is also included.  Nutrition Action Plan  Clinical staff conducted group or individual video education with verbal and written material and guidebook.  Patient learns how to incorporate Pritikin recommendations into their lifestyle. Recommendations include planning and keeping personal health goals in mind as an important part of their success.  Healthy Mind-Set    Healthy Minds, Bodies, Hearts  Clinical staff conducted group or individual video education with verbal and written material and guidebook.  Patient learns how to identify when they are stressed. Video will discuss the impact of that stress, as well as the many benefits of stress management. Patient will also be introduced to stress management techniques. The way we think, act, and feel has an impact on our hearts.  How Our Thoughts Can Heal Our Hearts  Clinical staff conducted group or individual video education with verbal and written material and guidebook.  Patient learns that negative thoughts can cause depression and anxiety. This can result in negative lifestyle behavior and serious health problems. Cognitive behavioral  therapy is an effective method to help control our thoughts in order to change and improve our emotional outlook.  Additional Videos:  Exercise    Improving Performance  Clinical staff conducted group or individual video education with verbal and written material and guidebook.  Patient learns to use a non-linear approach by alternating intensity levels and lengths of time spent exercising to help burn more calories and lose more body fat. Cardiovascular exercise helps improve heart health, metabolism, hormonal balance, blood sugar control, and recovery from fatigue. Resistance training improves strength, endurance, balance, coordination, reaction time, metabolism, and muscle mass. Flexibility exercise improves circulation, posture, and balance. Seek guidance from your physician and exercise physiologist before implementing an exercise routine and learn your capabilities and proper form for all exercise.  Introduction to Yoga  Clinical staff conducted group or individual video education with verbal and written material and guidebook.  Patient learns about yoga, a discipline of the coming together of mind, breath, and body. The benefits of yoga include improved flexibility, improved range of motion, better posture and core strength, increased lung function, weight loss, and positive self-image. Yoga's heart health benefits include lowered blood pressure, healthier heart rate, decreased cholesterol and triglyceride levels, improved immune function, and reduced stress. Seek guidance from your physician and exercise physiologist before implementing an exercise routine and learn your capabilities and proper form for all exercise.  Medical   Aging: Enhancing Your Quality of Life  Clinical staff conducted group or individual video education with verbal and written material and guidebook.  Patient learns key strategies and recommendations to stay in good physical health and enhance quality of  life, such as  prevention strategies, having an advocate, securing a Health Care Proxy and Power of Attorney, and keeping a list of medications and system for tracking them. It also discusses how to avoid risk for bone loss.  Biology of Weight Control  Clinical staff conducted group or individual video education with verbal and written material and guidebook.  Patient learns that weight gain occurs because we consume more calories than we burn (eating more, moving less). Even if your body weight is normal, you may have higher ratios of fat compared to muscle mass. Too much body fat puts you at increased risk for cardiovascular disease, heart attack, stroke, type 2 diabetes, and obesity-related cancers. In addition to exercise, following the [place] can help reduce your risk.  Decoding Lab Results  Clinical staff conducted group or individual video education with verbal and written material and guidebook.  Patient learns that lab test reflects one measurement whose values change over time and are influenced by many factors, including medication, stress, sleep, exercise, food, hydration, pre-existing medical conditions, and more. It is recommended to use the knowledge from this video to become more involved with your lab results and evaluate your numbers to speak with your doctor.   Diseases of Our Time - Overview  Clinical staff conducted group or individual video education with verbal and written material and guidebook.  Patient learns that according to the CDC, 50% to 70% of chronic diseases (such as obesity, type 2 diabetes, elevated lipids, hypertension, and heart disease) are avoidable through lifestyle improvements including healthier food choices, listening to satiety cues, and increased physical activity.  Sleep Disorders Clinical staff conducted group or individual video education with verbal and written material and guidebook.  Patient learns how good quality and duration of sleep are  important to overall health and well-being. Patient also learns about sleep disorders and how they impact health along with recommendations to address them, including discussing with a physician.  Nutrition  Dining Out - Part 2 Clinical staff conducted group or individual video education with verbal and written material and guidebook.  Patient learns how to plan ahead and communicate in order to maximize their dining experience in a healthy and nutritious manner. Included are recommended food choices based on the type of restaurant the patient is visiting.   Fueling a Best boy conducted group or individual video education with verbal and written material and guidebook.  There is a strong connection between our food choices and our health. Diseases like obesity and type 2 diabetes are very prevalent and are in large-part due to lifestyle choices. The Pritikin Eating Plan provides plenty of food and hunger-curbing satisfaction. It is easy to follow, affordable, and helps reduce health risks.  Menu Workshop  Clinical staff conducted group or individual video education with verbal and written material and guidebook.  Patient learns that restaurant meals can sabotage health goals because they are often packed with calories, fat, sodium, and sugar. Recommendations include strategies to plan ahead and to communicate with the manager, chef, or server to help order a healthier meal.  Planning Your Eating Strategy  Clinical staff conducted group or individual video education with verbal and written material and guidebook.  Patient learns about the [place] and its benefit of reducing the risk of disease. The [place] does not focus on calories. Instead, it emphasizes high-quality, nutrient-rich foods. By knowing the characteristics of the foods, we choose, we can determine their calorie density  and make informed decisions.  Targeting Your Nutrition Priorities   Clinical staff conducted group or individual video education with verbal and written material and guidebook.  Patient learns that lifestyle habits have a tremendous impact on disease risk and progression. This video provides eating and physical activity recommendations based on your personal health goals, such as reducing LDL cholesterol, losing weight, preventing or controlling type 2 diabetes, and reducing high blood pressure.  Vitamins and Minerals  Clinical staff conducted group or individual video education with verbal and written material and guidebook.  Patient learns different ways to obtain key vitamins and minerals, including through a recommended healthy diet. It is important to discuss all supplements you take with your doctor.   Healthy Mind-Set    Smoking Cessation  Clinical staff conducted group or individual video education with verbal and written material and guidebook.  Patient learns that cigarette smoking and tobacco addiction pose a serious health risk which affects millions of people. Stopping smoking will significantly reduce the risk of heart disease, lung disease, and many forms of cancer. Recommended strategies for quitting are covered, including working with your doctor to develop a successful plan.  Culinary   Becoming a Financial trader conducted group or individual video education with verbal and written material and guidebook.  Patient learns that cooking at home can be healthy, cost-effective, quick, and puts them in control. Keys to cooking healthy recipes will include looking at your recipe, assessing your equipment needs, planning ahead, making it simple, choosing cost-effective seasonal ingredients, and limiting the use of added fats, salts, and sugars.  Cooking - Breakfast and Snacks  Clinical staff conducted group or individual video education with verbal and written material and guidebook.  Patient learns how important breakfast is to satiety  and nutrition through the entire day. Recommendations include key foods to eat during breakfast to help stabilize blood sugar levels and to prevent overeating at meals later in the day. Planning ahead is also a key component.  Cooking - Human resources officer conducted group or individual video education with verbal and written material and guidebook.  Patient learns eating strategies to improve overall health, including an approach to cook more at home. Recommendations include thinking of animal protein as a side on your plate rather than center stage and focusing instead on lower calorie dense options like vegetables, fruits, whole grains, and plant-based proteins, such as beans. Making sauces in large quantities to freeze for later and leaving the skin on your vegetables are also recommended to maximize your experience.  Cooking - Healthy Salads and Dressing Clinical staff conducted group or individual video education with verbal and written material and guidebook.  Patient learns that vegetables, fruits, whole grains, and legumes are the foundations of the [place]. Recommendations include how to incorporate each of these in flavorful and healthy salads, and how to create homemade salad dressings. Proper handling of ingredients is also covered. Cooking - Soups and [employer] - Soups and Desserts Clinical staff conducted group or individual video education with verbal and written material and guidebook.  Patient learns that Pritikin soups and desserts make for easy, nutritious, and delicious snacks and meal components that are low in sodium, fat, sugar, and calorie density, while high in vitamins, minerals, and filling fiber. Recommendations include simple and healthy ideas for soups and desserts.   Overview     The Pritikin Solution Program Overview Clinical staff conducted group or individual video education with  verbal and written material and guidebook.  Patient  learns that the results of the [place] Program have been documented in more than 100 articles published in peer-reviewed journals, and the benefits include reducing risk factors for (and, in some cases, even reversing) high cholesterol, high blood pressure, type 2 diabetes, obesity, and more! An overview of the three key pillars of the Pritikin Program will be covered: eating well, doing regular exercise, and having a healthy mind-set.  WORKSHOPS  Exercise: Exercise Basics: Building Your Action Plan Clinical staff led group instruction and group discussion with PowerPoint presentation and patient guidebook. To enhance the learning environment the use of posters, models and videos may be added. At the conclusion of this workshop, patients will comprehend the difference between physical activity and exercise, as well as the benefits of incorporating both, into their routine. Patients will understand the FITT (Frequency, Intensity, Time, and Type) principle and how to use it to build an exercise action plan. In addition, safety concerns and other considerations for exercise and cardiac rehab will be addressed by the presenter. The purpose of this lesson is to promote a comprehensive and effective weekly exercise routine in order to improve patients' overall level of fitness.   Managing Heart Disease: Your Path to a Healthier Heart Clinical staff led group instruction and group discussion with PowerPoint presentation and patient guidebook. To enhance the learning environment the use of posters, models and videos may be added.At the conclusion of this workshop, patients will understand the anatomy and physiology of the heart. Additionally, they will understand how Pritikin's three pillars impact the risk factors, the progression, and the management of heart disease.  The purpose of this lesson is to provide a high-level overview of the heart, heart disease, and how the Pritikin lifestyle positively  impacts risk factors.  Exercise Biomechanics Clinical staff led group instruction and group discussion with PowerPoint presentation and patient guidebook. To enhance the learning environment the use of posters, models and videos may be added. Patients will learn how the structural parts of their bodies function and how these functions impact their daily activities, movement, and exercise. Patients will learn how to promote a neutral spine, learn how to manage pain, and identify ways to improve their physical movement in order to promote healthy living. The purpose of this lesson is to expose patients to common physical limitations that impact physical activity. Participants will learn practical ways to adapt and manage aches and pains, and to minimize their effect on regular exercise. Patients will learn how to maintain good posture while sitting, walking, and lifting.  Balance Training and Fall Prevention  Clinical staff led group instruction and group discussion with PowerPoint presentation and patient guidebook. To enhance the learning environment the use of posters, models and videos may be added. At the conclusion of this workshop, patients will understand the importance of their sensorimotor skills (vision, proprioception, and the vestibular system) in maintaining their ability to balance as they age. Patients will apply a variety of balancing exercises that are appropriate for their current level of function. Patients will understand the common causes for poor balance, possible solutions to these problems, and ways to modify their physical environment in order to minimize their fall risk. The purpose of this lesson is to teach patients about the importance of maintaining balance as they age and ways to minimize their risk of falling.  WORKSHOPS   Nutrition:  Fueling a Scientist, research (physical sciences) led group instruction and group discussion with PowerPoint presentation  and patient  guidebook. To enhance the learning environment the use of posters, models and videos may be added. Patients will review the foundational principles of the [place] and understand what constitutes a serving size in each of the food groups. Patients will also learn Pritikin-friendly foods that are better choices when away from home and review make-ahead meal and snack options. Calorie density will be reviewed and applied to three nutrition priorities: weight maintenance, weight loss, and weight gain. The purpose of this lesson is to reinforce (in a group setting) the key concepts around what patients are recommended to eat and how to apply these guidelines when away from home by planning and selecting Pritikin-friendly options. Patients will understand how calorie density may be adjusted for different weight management goals.  Mindful Eating  Clinical staff led group instruction and group discussion with PowerPoint presentation and patient guidebook. To enhance the learning environment the use of posters, models and videos may be added. Patients will briefly review the concepts of the [place] and the importance of low-calorie dense foods. The concept of mindful eating will be introduced as well as the importance of paying attention to internal hunger signals. Triggers for non-hunger eating and techniques for dealing with triggers will be explored. The purpose of this lesson is to provide patients with the opportunity to review the basic principles of the [place], discuss the value of eating mindfully and how to measure internal cues of hunger and fullness using the Hunger Scale. Patients will also discuss reasons for non-hunger eating and learn strategies to use for controlling emotional eating.  Targeting Your Nutrition Priorities Clinical staff led group instruction and group discussion with PowerPoint presentation and patient guidebook. To enhance the learning  environment the use of posters, models and videos may be added. Patients will learn how to determine their genetic susceptibility to disease by reviewing their family history. Patients will gain insight into the importance of diet as part of an overall healthy lifestyle in mitigating the impact of genetics and other environmental insults. The purpose of this lesson is to provide patients with the opportunity to assess their personal nutrition priorities by looking at their family history, their own health history and current risk factors. Patients will also be able to discuss ways of prioritizing and modifying the [place] for their highest risk areas  Menu  Clinical staff led group instruction and group discussion with PowerPoint presentation and patient guidebook. To enhance the learning environment the use of posters, models and videos may be added. Using menus brought in from [employer], or printed from [employer], patients will apply the [place] dining out guidelines that were presented in the [employer] video. Patients will also be able to practice these guidelines in a variety of provided scenarios. The purpose of this lesson is to provide patients with the opportunity to practice hands-on learning of the [place] with actual menus and practice scenarios.  Label Reading Clinical staff led group instruction and group discussion with PowerPoint presentation and patient guidebook. To enhance the learning environment the use of posters, models and videos may be added. Patients will review and discuss the Pritikin label reading guidelines presented in Pritikin's Label Reading Educational series video. Using fool labels brought in from local grocery stores and markets, patients will apply the label reading guidelines and determine if the packaged food meet the Pritikin guidelines. The purpose of this lesson is to provide patients with  the  opportunity to review, discuss, and practice hands-on learning of the Pritikin Label Reading guidelines with actual packaged food labels. [place] Workshops are designed to teach patients ways to prepare quick, simple, and affordable recipes at home. The importance of nutrition's role in chronic disease risk reduction is reflected in its emphasis in the overall Pritikin program. By learning how to prepare essential core Pritikin Eating Plan recipes, patients will increase control over what they eat; be able to customize the flavor of foods without the use of added salt, sugar, or fat; and improve the quality of the food they consume. By learning a set of core recipes which are easily assembled, quickly prepared, and affordable, patients are more likely to prepare more healthy foods at home. These workshops focus on convenient breakfasts, simple entres, side dishes, and desserts which can be prepared with minimal effort and are consistent with nutrition recommendations for cardiovascular risk reduction. Cooking [employer] are taught by a Engineer, materials (RD) who has been trained by the [employer]. The chef or RD has a clear understanding of the importance of minimizing - if not completely eliminating - added fat, sugar, and sodium in recipes. Throughout the series of [employer] Workshop sessions, patients will learn about healthy ingredients and efficient methods of cooking to build confidence in their capability to prepare    Cooking School weekly topics:  Adding Flavor- Sodium-Free  Fast and Healthy Breakfasts  Powerhouse Plant-Based Proteins  Satisfying Salads and Dressings  Simple Sides and Sauces  International Cuisine-Spotlight on the [place] Zones  Delicious Desserts  Savory Soups  Efficiency Cooking - Meals in a Snap  Tasty Appetizers and Snacks  Comforting Weekend Breakfasts  One-Pot Wonders   Fast Evening Meals  Easy  [place] (Psychosocial): New Thoughts, New Behaviors Clinical staff led group instruction and group discussion with PowerPoint presentation and patient guidebook. To enhance the learning environment the use of posters, models and videos may be added. Patients will learn and practice techniques for developing effective health and lifestyle goals. Patients will be able to effectively apply the goal setting process learned to develop at least one new personal goal.  The purpose of this lesson is to expose patients to a new skill set of behavior modification techniques such as techniques setting SMART goals, overcoming barriers, and achieving new thoughts and new behaviors.  Managing Moods and Relationships Clinical staff led group instruction and group discussion with PowerPoint presentation and patient guidebook. To enhance the learning environment the use of posters, models and videos may be added. Patients will learn how emotional and chronic stress factors can impact their health and relationships. They will learn healthy ways to manage their moods and utilize positive coping mechanisms. In addition, ICR patients will learn ways to improve communication skills. The purpose of this lesson is to expose patients to ways of understanding how one's mood and health are intimately connected. Developing a healthy outlook can help build positive relationships and connections with others. Patients will understand the importance of utilizing effective communication skills that include actively listening and being heard. They will learn and understand the importance of the "4 Cs" and especially Connections in fostering of a Healthy Mind-Set.  Healthy Sleep for a Healthy Heart Clinical staff led group instruction and group discussion with PowerPoint presentation and patient guidebook. To enhance the learning environment the use of posters, models  and  videos may be added. At the conclusion of this workshop, patients will be able to demonstrate knowledge of the importance of sleep to overall health, well-being, and quality of life. They will understand the symptoms of, and treatments for, common sleep disorders. Patients will also be able to identify daytime and nighttime behaviors which impact sleep, and they will be able to apply these tools to help manage sleep-related challenges. The purpose of this lesson is to provide patients with a general overview of sleep and outline the importance of quality sleep. Patients will learn about a few of the most common sleep disorders. Patients will also be introduced to the concept of "sleep hygiene," and discover ways to self-manage certain sleeping problems through simple daily behavior changes. Finally, the workshop will motivate patients by clarifying the links between quality sleep and their goals of heart-healthy living.   Recognizing and Reducing Stress Clinical staff led group instruction and group discussion with PowerPoint presentation and patient guidebook. To enhance the learning environment the use of posters, models and videos may be added. At the conclusion of this workshop, patients will be able to understand the types of stress reactions, differentiate between acute and chronic stress, and recognize the impact that chronic stress has on their health. They will also be able to apply different coping mechanisms, such as reframing negative self-talk. Patients will have the opportunity to practice a variety of stress management techniques, such as deep abdominal breathing, progressive muscle relaxation, and/or guided imagery.  The purpose of this lesson is to educate patients on the role of stress in their lives and to provide healthy techniques for coping with it.  Learning Barriers/Preferences:  Learning Barriers/Preferences - 10/11/22 0956       Learning Barriers/Preferences   Learning  Barriers Sight;Hearing   wears glasses, HOH both ears   Learning Preferences Verbal Instruction;Written Material;Computer/Internet             Education Topics:  Knowledge Questionnaire Score:  Knowledge Questionnaire Score - 10/11/22 0958       Knowledge Questionnaire Score   Pre Score 19/24             Core Components/Risk Factors/Patient Goals at Admission:  Personal Goals and Risk Factors at Admission - 10/11/22 0958       Core Components/Risk Factors/Patient Goals on Admission    Weight Management Yes;Obesity;Weight Loss    Intervention Weight Management: Develop a combined nutrition and exercise program designed to reach desired caloric intake, while maintaining appropriate intake of nutrient and fiber, sodium and fats, and appropriate energy expenditure required for the weight goal.;Weight Management: Provide education and appropriate resources to help participant work on and attain dietary goals.;Weight Management/Obesity: Establish reasonable short term and long term weight goals.;Obesity: Provide education and appropriate resources to help participant work on and attain dietary goals.    Admit Weight 210 lb 15.7 oz (95.7 kg)    Goal Weight: Long Term 190 lb (86.2 kg)   Pt goal   Expected Outcomes Short Term: Continue to assess and modify interventions until short term weight is achieved;Long Term: Adherence to nutrition and physical activity/exercise program aimed toward attainment of established weight goal;Weight Maintenance: Understanding of the daily nutrition guidelines, which includes 25-35% calories from fat, 7% or less cal from saturated fats, less than '200mg'$  cholesterol, less than 1.5gm of sodium, & 5 or more servings of fruits and vegetables daily;Weight Loss: Understanding of general recommendations for a balanced deficit meal plan, which promotes 1-2 lb weight loss per  week and includes a negative energy balance of [phone] kcal/d;Understanding recommendations  for meals to include 15-35% energy as protein, 25-35% energy from fat, 35-60% energy from carbohydrates, less than '200mg'$  of dietary cholesterol, 20-35 gm of total fiber daily;Understanding of distribution of calorie intake throughout the day with the consumption of 4-5 meals/snacks    Hypertension Yes    Intervention Provide education on lifestyle modifcations including regular physical activity/exercise, weight management, moderate sodium restriction and increased consumption of fresh fruit, vegetables, and low fat dairy, alcohol moderation, and smoking cessation.;Monitor prescription use compliance.    Expected Outcomes Short Term: Continued assessment and intervention until BP is < 140/52m HG in hypertensive participants. < 130/891mHG in hypertensive participants with diabetes, heart failure or chronic kidney disease.;Long Term: Maintenance of blood pressure at goal levels.    Lipids Yes    Intervention Provide education and support for participant on nutrition & aerobic/resistive exercise along with prescribed medications to achieve LDL '70mg'$ , HDL >'40mg'$ .    Expected Outcomes Short Term: Participant states understanding of desired cholesterol values and is compliant with medications prescribed. Participant is following exercise prescription and nutrition guidelines.;Long Term: Cholesterol controlled with medications as prescribed, with individualized exercise RX and with personalized nutrition plan. Value goals: LDL < '70mg'$ , HDL > 40 mg.    Stress Yes    Intervention Offer individual and/or small group education and counseling on adjustment to heart disease, stress management and health-related lifestyle change. Teach and support self-help strategies.;Refer participants experiencing significant psychosocial distress to appropriate mental health specialists for further evaluation and treatment. When possible, include family members and significant others in education/counseling sessions.    Expected  Outcomes Short Term: Participant demonstrates changes in health-related behavior, relaxation and other stress management skills, ability to obtain effective social support, and compliance with psychotropic medications if prescribed.;Long Term: Emotional wellbeing is indicated by absence of clinically significant psychosocial distress or social isolation.             Core Components/Risk Factors/Patient Goals Review:   Goals and Risk Factor Review     Row Name 11/15/22 1412 12/14/22 0942           Core Components/Risk Factors/Patient Goals Review   Personal Goals Review Weight Management/Obesity;Stress;Hypertension;Lipids Weight Management/Obesity;Stress;Hypertension;Lipids      Review Patrick Franco doing well with exercise, vital signs have been stable. Patrick Franco intensive cardiac rehab has been helpful for him and he feels stronger. Patrick Franco doing well with exercise, vital signs have been stable. Patrick Franco intensive cardiac rehab has been helpful for him and he feels stronger. Patrick Franco complete intensive cardiac rehab on 12/30/22      Expected Outcomes Patrick Franco continue to particiapte in intensive cardiac rehab for exercise, nutrition and lifestyle modifications Patrick Franco continue to particiapte in intensive cardiac rehab for exercise, nutrition and lifestyle modifications               Core Components/Risk Factors/Patient Goals at Discharge (Final Review):   Goals and Risk Factor Review - 12/14/22 0942       Core Components/Risk Factors/Patient Goals Review   Personal Goals Review Weight Management/Obesity;Stress;Hypertension;Lipids    Review Patrick Franco doing well with exercise, vital signs have been stable. GaTammieays intensive cardiac rehab has been helpful for him and he feels stronger. GaTobbyill complete intensive cardiac rehab on 12/30/22    Expected Outcomes GaLeulill continue to particiapte in intensive cardiac rehab for exercise, nutrition and lifestyle modifications  ITP Comments:  ITP Comments     Row Name 10/11/22 0848 10/17/22 1447 11/15/22 1410 12/14/22 0941     ITP Comments Dr Fransico Him Md, Medical Director, Introduction to Pritikin Education program, Intensive Cardiac Rehab. Initial orientation packet reviewed with the patient 30 Day ITP Review. Tyrese plans to begin exercise on 10/19/22 30 Day ITP Review. Thayer has good attendance and participation in intensvie cardiac rehab. 30 Day ITP Review. Jabre has good attendance and participation in intensvie cardiac rehab. [place] will complete the program on 12/30/22             Comments: See ITP comments.Harrell Gave RN BSN

## 2022-12-16 ENCOUNTER — Encounter (HOSPITAL_COMMUNITY)
Admission: RE | Admit: 2022-12-16 | Discharge: 2022-12-16 | Disposition: A | Discharge status: Home / Self Care | Payer: Medicare Other | Source: Ambulatory Visit | Attending: Cardiovascular Disease | Admitting: Cardiovascular Disease

## 2022-12-16 DIAGNOSIS — Z952 Presence of prosthetic heart valve: Secondary | ICD-10-CM | POA: Diagnosis not present

## 2022-12-19 ENCOUNTER — Encounter (HOSPITAL_COMMUNITY)
Admission: RE | Admit: 2022-12-19 | Discharge: 2022-12-19 | Disposition: A | Discharge status: Home / Self Care | Payer: Medicare Other | Source: Ambulatory Visit | Attending: Cardiovascular Disease | Admitting: Cardiovascular Disease

## 2022-12-19 DIAGNOSIS — Z952 Presence of prosthetic heart valve: Secondary | ICD-10-CM | POA: Diagnosis not present

## 2022-12-21 ENCOUNTER — Encounter (HOSPITAL_COMMUNITY)
Admission: RE | Admit: 2022-12-21 | Discharge: 2022-12-21 | Disposition: A | Discharge status: Home / Self Care | Payer: Medicare Other | Source: Ambulatory Visit | Attending: Cardiovascular Disease | Admitting: Cardiovascular Disease

## 2022-12-21 DIAGNOSIS — Z952 Presence of prosthetic heart valve: Secondary | ICD-10-CM | POA: Diagnosis not present

## 2022-12-22 ENCOUNTER — Encounter (HOSPITAL_COMMUNITY): Payer: Self-pay | Admitting: *Deleted

## 2022-12-23 ENCOUNTER — Encounter (HOSPITAL_COMMUNITY)
Admission: RE | Admit: 2022-12-23 | Discharge: 2022-12-23 | Disposition: A | Discharge status: Home / Self Care | Payer: Medicare Other | Source: Ambulatory Visit | Attending: Cardiovascular Disease | Admitting: Cardiovascular Disease

## 2022-12-23 DIAGNOSIS — Z952 Presence of prosthetic heart valve: Secondary | ICD-10-CM

## 2022-12-26 ENCOUNTER — Encounter (HOSPITAL_COMMUNITY)
Admission: RE | Admit: 2022-12-26 | Discharge: 2022-12-26 | Disposition: A | Discharge status: Home / Self Care | Payer: Medicare Other | Source: Ambulatory Visit | Attending: Cardiovascular Disease | Admitting: Cardiovascular Disease

## 2022-12-26 DIAGNOSIS — Z952 Presence of prosthetic heart valve: Secondary | ICD-10-CM | POA: Diagnosis not present

## 2022-12-28 ENCOUNTER — Encounter (HOSPITAL_COMMUNITY)
Admission: RE | Admit: 2022-12-28 | Discharge: 2022-12-28 | Disposition: A | Discharge status: Home / Self Care | Payer: Medicare Other | Source: Ambulatory Visit | Attending: Cardiovascular Disease | Admitting: Cardiovascular Disease

## 2022-12-28 VITALS — Ht 68.75 in | Wt 211.9 lb

## 2022-12-28 DIAGNOSIS — Z952 Presence of prosthetic heart valve: Secondary | ICD-10-CM

## 2022-12-30 ENCOUNTER — Encounter (HOSPITAL_COMMUNITY)
Admission: RE | Admit: 2022-12-30 | Discharge: 2022-12-30 | Disposition: A | Discharge status: Home / Self Care | Payer: Medicare Other | Source: Ambulatory Visit | Attending: Cardiovascular Disease | Admitting: Cardiovascular Disease

## 2022-12-30 DIAGNOSIS — Z952 Presence of prosthetic heart valve: Secondary | ICD-10-CM | POA: Diagnosis not present

## 2022-12-30 NOTE — Progress Notes (Signed)
Discharge Progress Report  Patient Details  Name: Patrick Franco MRN: [id number] Date of Birth: 20-Feb-1932 Referring Provider:   Flowsheet Row INTENSIVE CARDIAC REHAB ORIENT from 10/11/2022 in [facility]  Referring Provider [name], MD        Number of Visits: 110  Reason for Discharge:  Patient reached a stable level of exercise. Patient independent in their exercise. Patient has met program and personal goals.  Smoking History:  Social History   Tobacco Use  Smoking Status Former   Packs/day: 0.50   Types: Cigarettes   Start date: 72   Quit date: 1961   Years since quitting: 63.1  Smokeless Tobacco Never  Tobacco Comments   Former smoker  Pt smoked for 78month when in [employer] 12/07/22    Diagnosis:  07/26/22 TAVR  ADL UCSD:   Initial Exercise Prescription:  Initial Exercise Prescription - 10/11/22 1000       Date of Initial Exercise RX and Referring Provider   Date 10/11/22    Referring Provider CLauree Chandler MD    Expected Discharge Date 12/09/22      NuStep   Level 1    SPM 75    Minutes 30    METs 1.5      Prescription Details   Frequency (times per week) 3    Duration Progress to 30 minutes of continuous aerobic without signs/symptoms of physical distress      Intensity   THRR 40-80% of Max Heartrate 53-106    Ratings of Perceived Exertion 11-13    Perceived Dyspnea 0-4      Progression   Progression Continue progressive overload as per policy without signs/symptoms or physical distress.      Resistance Training   Training Prescription Yes    Weight 3 lbs    Reps 10-15             Discharge Exercise Prescription (Final Exercise Prescription Changes):  Exercise Prescription Changes - 12/30/22 1600       Response to Exercise   Blood Pressure (Admit) 130/70    Blood Pressure (Exercise) 158/80    Blood Pressure (Exit) 130/66    Heart Rate (Admit) 69 bpm     Heart Rate (Exercise) 122 bpm    Heart Rate (Exit) 82 bpm    Rating of Perceived Exertion (Exercise) 12    Symptoms None    Comments Pt graduated form the CRP2 program today    Duration Continue with 30 min of aerobic exercise without signs/symptoms of physical distress.    Intensity THRR unchanged      Progression   Progression Continue to progress workloads to maintain intensity without signs/symptoms of physical distress.    Average METs 3.8      Resistance Training   Training Prescription Yes    Weight 3 lbs    Reps 10-15    Time 10 Minutes      Interval Training   Interval Training No      NuStep   Level 4    SPM 104    Minutes 30    METs 3.8      Home Exercise Plan   Plans to continue exercise at Home (comment)    Frequency Add 2 additional days to program exercise sessions.    Initial Home Exercises Provided 11/02/22             Functional Capacity:  6 Minute Walk  [place] Name 10/11/22 1000 12/26/22 0925       6 Minute Walk   Phase Initial Discharge    Distance 1005 feet  Used GOCART for balance concerns 1286 feet    Distance % Change -- 27.96 %    Distance Feet Change -- 281 ft    Walk Time 6 minutes 6 minutes    # of Rest Breaks 0 0    MPH 1.9 2.44    METS 1.42 1.92    RPE 11 11    Perceived Dyspnea  0 0    VO2 Peak 5 6.71    Symptoms No No    Resting HR 65 bpm 63 bpm    Resting BP 108/74 138/60    Resting Oxygen Saturation  96 % --    Exercise Oxygen Saturation  during 6 min walk 93 % --    Max Ex. HR 101 bpm 101 bpm    Max Ex. BP 152/72 154/76    2 Minute Post BP 110/70 --             Psychological, QOL, Others - Outcomes: PHQ 2/9:    12/30/2022    9:42 AM 10/11/2022    8:52 AM  Depression screen PHQ 2/9  Decreased Interest 0 0  Down, Depressed, Hopeless 0 0  PHQ - 2 Score 0 0    Quality of Life:  Quality of Life - 12/26/22 1646       Quality of Life   Select Quality of Life      Quality of Life Scores    Health/Function Pre 19.83 %    Health/Function Post 18.04 %    Health/Function % Change -9.03 %    Socioeconomic Pre 28.33 %    Socioeconomic Post 26.64 %    Socioeconomic % Change  -5.97 %    Psych/Spiritual Pre 20.93 %    Psych/Spiritual Post 20.21 %    Psych/Spiritual % Change -3.44 %    Family Pre 27 %    Family Post 27.6 %    Family % Change 2.22 %    GLOBAL Pre 22.7 %    GLOBAL Post 21.77 %    GLOBAL % Change -4.1 %             Personal Goals: Goals established at orientation with interventions provided to work toward goal.  Personal Goals and Risk Factors at Admission - 10/11/22 0958       Core Components/Risk Factors/Patient Goals on Admission    Weight Management Yes;Obesity;Weight Loss    Intervention Weight Management: Develop a combined nutrition and exercise program designed to reach desired caloric intake, while maintaining appropriate intake of nutrient and fiber, sodium and fats, and appropriate energy expenditure required for the weight goal.;Weight Management: Provide education and appropriate resources to help participant work on and attain dietary goals.;Weight Management/Obesity: Establish reasonable short term and long term weight goals.;Obesity: Provide education and appropriate resources to help participant work on and attain dietary goals.    Admit Weight 210 lb 15.7 oz (95.7 kg)    Goal Weight: Long Term 190 lb (86.2 kg)   Pt goal   Expected Outcomes Short Term: Continue to assess and modify interventions until short term weight is achieved;Long Term: Adherence to nutrition and physical activity/exercise program aimed toward attainment of established weight goal;Weight Maintenance: Understanding of the daily nutrition guidelines, which includes 25-35% calories from fat, 7% or less cal from saturated fats, less than '200mg'$  cholesterol, less than 1.5gm  of sodium, & 5 or more servings of fruits and vegetables daily;Weight Loss: Understanding of general  recommendations for a balanced deficit meal plan, which promotes 1-2 lb weight loss per week and includes a negative energy balance of [phone] kcal/d;Understanding recommendations for meals to include 15-35% energy as protein, 25-35% energy from fat, 35-60% energy from carbohydrates, less than '200mg'$  of dietary cholesterol, 20-35 gm of total fiber daily;Understanding of distribution of calorie intake throughout the day with the consumption of 4-5 meals/snacks    Hypertension Yes    Intervention Provide education on lifestyle modifcations including regular physical activity/exercise, weight management, moderate sodium restriction and increased consumption of fresh fruit, vegetables, and low fat dairy, alcohol moderation, and smoking cessation.;Monitor prescription use compliance.    Expected Outcomes Short Term: Continued assessment and intervention until BP is < 140/66m HG in hypertensive participants. < 130/833mHG in hypertensive participants with diabetes, heart failure or chronic kidney disease.;Long Term: Maintenance of blood pressure at goal levels.    Lipids Yes    Intervention Provide education and support for participant on nutrition & aerobic/resistive exercise along with prescribed medications to achieve LDL '70mg'$ , HDL >'40mg'$ .    Expected Outcomes Short Term: Participant states understanding of desired cholesterol values and is compliant with medications prescribed. Participant is following exercise prescription and nutrition guidelines.;Long Term: Cholesterol controlled with medications as prescribed, with individualized exercise RX and with personalized nutrition plan. Value goals: LDL < '70mg'$ , HDL > 40 mg.    Stress Yes    Intervention Offer individual and/or small group education and counseling on adjustment to heart disease, stress management and health-related lifestyle change. Teach and support self-help strategies.;Refer participants experiencing significant psychosocial distress to  appropriate mental health specialists for further evaluation and treatment. When possible, include family members and significant others in education/counseling sessions.    Expected Outcomes Short Term: Participant demonstrates changes in health-related behavior, relaxation and other stress management skills, ability to obtain effective social support, and compliance with psychotropic medications if prescribed.;Long Term: Emotional wellbeing is indicated by absence of clinically significant psychosocial distress or social isolation.              Personal Goals Discharge:  Goals and Risk Factor Review     Row Name 11/15/22 1412 12/14/22 0942           Core Components/Risk Factors/Patient Goals Review   Personal Goals Review Weight Management/Obesity;Stress;Hypertension;Lipids Weight Management/Obesity;Stress;Hypertension;Lipids      Review GaShandons doing well with exercise, vital signs have been stable. GaTerikays intensive cardiac rehab has been helpful for him and he feels stronger. GaDaylyns doing well with exercise, vital signs have been stable. GaVestonays intensive cardiac rehab has been helpful for him and he feels stronger. GaHeraldoill complete intensive cardiac rehab on 12/30/22      Expected Outcomes GaDerinill continue to particiapte in intensive cardiac rehab for exercise, nutrition and lifestyle modifications GaDemarionill continue to particiapte in intensive cardiac rehab for exercise, nutrition and lifestyle modifications               Exercise Goals and Review:  Exercise Goals     Row Name 10/11/22 1002             Exercise Goals   Increase Physical Activity Yes       Intervention Provide advice, education, support and counseling about physical activity/exercise needs.;Develop an individualized exercise prescription for aerobic and resistive training based on initial evaluation findings, risk stratification, comorbidities and participant's  personal goals.       Expected  Outcomes Short Term: Attend rehab on a regular basis to increase amount of physical activity.;Long Term: Add in home exercise to make exercise part of routine and to increase amount of physical activity.;Long Term: Exercising regularly at least 3-5 days a week.       Increase Strength and Stamina Yes       Intervention Develop an individualized exercise prescription for aerobic and resistive training based on initial evaluation findings, risk stratification, comorbidities and participant's personal goals.;Provide advice, education, support and counseling about physical activity/exercise needs.       Expected Outcomes Short Term: Increase workloads from initial exercise prescription for resistance, speed, and METs.;Short Term: Perform resistance training exercises routinely during rehab and add in resistance training at home;Long Term: Improve cardiorespiratory fitness, muscular endurance and strength as measured by increased METs and functional capacity (6MWT)       Able to understand and use rate of perceived exertion (RPE) scale Yes       Intervention Provide education and explanation on how to use RPE scale       Expected Outcomes Short Term: Able to use RPE daily in rehab to express subjective intensity level;Long Term:  Able to use RPE to guide intensity level when exercising independently       Knowledge and understanding of Target Heart Rate Range (THRR) Yes       Intervention Provide education and explanation of THRR including how the numbers were predicted and where they are located for reference       Expected Outcomes Short Term: Able to state/look up THRR;Short Term: Able to use daily as guideline for intensity in rehab;Long Term: Able to use THRR to govern intensity when exercising independently       Understanding of Exercise Prescription Yes       Intervention Provide education, explanation, and written materials on patient's individual exercise prescription       Expected Outcomes Short  Term: Able to explain program exercise prescription;Long Term: Able to explain home exercise prescription to exercise independently                Exercise Goals Re-Evaluation:  Exercise Goals Re-Evaluation     Row Name 10/21/22 1200 11/02/22 1153 11/23/22 1222 12/21/22 1447 12/22/22 0830     Exercise Goal Re-Evaluation   Exercise Goals Review Increase Physical Activity;Increase Strength and Stamina;Able to understand and use rate of perceived exertion (RPE) scale;Knowledge and understanding of Target Heart Rate Range (THRR);Understanding of Exercise Prescription Increase Physical Activity;Increase Strength and Stamina;Able to understand and use rate of perceived exertion (RPE) scale;Knowledge and understanding of Target Heart Rate Range (THRR);Understanding of Exercise Prescription Increase Physical Activity;Increase Strength and Stamina;Able to understand and use rate of perceived exertion (RPE) scale;Knowledge and understanding of Target Heart Rate Range (THRR);Understanding of Exercise Prescription Increase Physical Activity;Increase Strength and Stamina;Able to understand and use rate of perceived exertion (RPE) scale;Knowledge and understanding of Target Heart Rate Range (THRR);Understanding of Exercise Prescription --   Comments Pt's first day in the CRP2 program. Pt understands the exercise Rx, THRR, and RPE scale. Reviewed METs, goals, and home exercise Rx. Pt is exercising in the pool 1 day a week, and doing stretching and Pilates with a trainer. Pt goal of weight loss has not seen any significant progress. Pt does voice that he has more stamina and strength, especially in his legs which was a goal. Reviewed METs, goals, and home exercise Rx. Pt is  exercising in the pool 1 day a week, and doing stretching and Pilates with a trainer. Pt does voice that he has more stamina and strength, especially in his legs which was a goal. He also voices that he is less "winded" with exercise and he  trainer also also commented to him about his improvement. Reviewed METs and goals. Pt feeling good about his progress and imrpovement in his stamina and strength. Pt continues to exercise on his off days witha trainer and in the pool. --   Expected Outcomes Will continue to montior patient and progress exercise workloads as tolerated. Will continue to montior patient and progress exercise workloads as tolerated. Will continue to montior patient and progress exercise workloads as tolerated. Will continue to montior patient and progress exercise workloads as tolerated. --    [place] Name 12/30/22 1100             Exercise Goal Re-Evaluation   Exercise Goals Review Increase Physical Activity;Increase Strength and Stamina;Able to understand and use rate of perceived exertion (RPE) scale;Knowledge and understanding of Target Heart Rate Range (THRR);Understanding of Exercise Prescription       Comments Pt graduated from the [place] program today. Pt made good progess with peak METs of 3.8. Pt will continue to exercise at the gym at [place]. Pt is working with a Clinical research associate, doing Pilates, and will use the nustep, and water aerobics 1x/week. Pt voices improved strength and stamina since coming to the program.       Expected Outcomes Pt will continue to exercise on his own.                Nutrition & Weight - Outcomes:  Pre Biometrics - 10/11/22 0800       Pre Biometrics   Waist Circumference 44 inches    Hip Circumference 45 inches    Waist to Hip Ratio 0.98 %    Triceps Skinfold 17 mm    % Body Fat 31.9 %    Grip Strength 30 kg    Flexibility --   Not perfomed; significant back issues   Single Leg Stand 1 seconds             Post Biometrics - 12/26/22 1114        Post  Biometrics   Height 5' 8.75" (1.746 m)    Weight 96.1 kg    Waist Circumference 44 inches    Hip Circumference 44.5 inches    Waist to Hip Ratio 0.99 %    BMI (Calculated) 31.52    Triceps Skinfold 17 mm     % Body Fat 31.9 %    Grip Strength 30 kg    Flexibility --   No performed   Single Leg Stand 2 seconds             Nutrition:  Nutrition Therapy & Goals - 12/13/22 1117       Nutrition Therapy   Diet Heart Healthy Diet    Drug/Food Interactions Statins/Certain Fruits      Personal Nutrition Goals   Nutrition Goal Patient to identify strategies for reducing cardiovascular risk by attending the weekly [facility] education and nutrition series.    Personal Goal #2 Patient to limit sodium intake to '1500mg'$  per day    Personal Goal #3 Patient to improve diet quality by using the plate method as a guide for meal planning to include lean protein/plant protien, fruits, vegetables, whole grains, and nonfat dairy as part of heart healthy lifestyle  Comments Justinlee is pre-comtemplative of nutrition changes. He lives at [facility] assisted [facility] and all of his meals are provided there; he does continue to enjoy a wide variety of foods. He continues to report interest in reducing wine intake to <2 glasses per day; however, he has been to slow to adopt changes with alcohol intake. He continues to attend the [facility] education series regularly. He is down 1.8# since starting with our program. Shivin will continue to benefit from adherance to the [facility] eating plan and participation in intensive cardiac rehab for nutrition, exercise, and lifestyle modification      Intervention Plan   Intervention Prescribe, educate and counsel regarding individualized specific dietary modifications aiming towards targeted core components such as weight, hypertension, lipid management, diabetes, heart failure and other comorbidities.;Nutrition handout(s) given to patient.    Expected Outcomes Short Term Goal: Understand basic principles of dietary content, such as calories, fat, sodium, cholesterol and nutrients.;Long Term Goal: Adherence to prescribed nutrition plan.             Nutrition Discharge:  Nutrition  Assessments - 01/03/23 1204       Rate Your Plate Scores   Post Score 58             Education Questionnaire Score:  Knowledge Questionnaire Score - 12/26/22 1646       Knowledge Questionnaire Score   Post Score 21/24             Goals reviewed with patient; copy given to patient.Pt graduates from  Intensive/Traditional cardiac rehab program today with completion of  24 exercise and  23education sessions. Pt maintained good attendance and progressed nicely during their participation in rehab as evidenced by increased MET level.   Medication list reconciled. Repeat  PHQ score-  0.  Pt has made significant lifestyle changes and should be commended for their success. Mase  achieved their goals during cardiac rehab.   Pt plans to continue exercise at [employer] retirement community walking at the pool 2 days a week. Working at [employer] 5 days a week using the same equipment. Carolos increased his distance on his post exercise walk test by 281 feet. Marreon says that participating in intensive cardiac rehab has been helpful for him. We are proud of Avyaan's progress!

## 2023-01-20 ENCOUNTER — Ambulatory Visit: Payer: Medicare Other | Admitting: Cardiovascular Disease

## 2023-01-23 ENCOUNTER — Other Ambulatory Visit: Payer: Self-pay | Admitting: Cardiovascular Disease

## 2023-01-23 DIAGNOSIS — M109 Gout, unspecified: Secondary | ICD-10-CM

## 2023-02-27 NOTE — Progress Notes (Unsigned)
Cardiology Office Note:    Date:  02/28/2023  ID:  Patrick Franco, DOB 1932/11/14, MRN [id number] PCP: Kirby Funk, MD  [facility] Providers Cardiologist:  Verne Carrow, MD Electrophysiologist:  Hillis Range, MD (Inactive)      Patient Profile:   Coronary artery disease CT 12/2017: LM and 3 v coronary atherosclerosis  Myoview 09/08/21: low risk, EF normal  Cath 05/26/22: RCA ost to mid 20, LCx mid 40, LAD prox to mid 30, dist 99 >> PCI attempt of LAD unsuccessful - med Rx unless refractory angina  Aortic stenosis, s/p TAVR 07/2022  TTE 09/28/22: EF 65-70, no RWMA, mod LVH, NL RVSF, mod LAE, mild RAE, bileaflet MVP, mild to mod MR, MAC, s/p TAVR with mild AI, mild PVL, mean 13 mmHg, Vmax 260 cm/s, DI 0.6, RAP 3 (HFpEF) heart failure with preserved ejection fraction  Atrial Fibrillation/Flutter New onset 02/2020 >> c/b acute diastolic CHF; admitted to hosp S/p TEE-DCCV 03/2020 Dofetilide started 6.2021>>DCd during admit for SBO 08/2022 >> Amiodarone Rx Hypertension  MVP Mild to mod by TTE 09/2022 Sleep apnea  PACs Aortic atherosclerosis (CT 12/2017)   Tracheal malacia  Gastric ulcer Hx of SBO 08/2022    History of Present Illness:   Patrick Franco is a 89 y.o. male returns for f/u on CAD, CHF, aortic stenosis, AFib. He was last seen in clinic by Cline Crock, PA-C for structural heart f/u 09/28/22.  He is here alone.  He has been doing well without chest pain, orthopnea, paroxysmal nocturnal dyspnea, leg edema, syncope.  He does get short of breath with some activities.  His mobility is limited secondary to spinal stenosis, prior hip and knee replacement on the right and neuropathy.  He walks with a cane.  He is able to work with a trainer doing Pilates, pool exercises and riding a stationary bike without significant shortness of breath.  Review of Systems  Musculoskeletal:  Positive for back pain and joint pain.   see HPI    Studies Reviewed:    EKG:  not done     Risk Assessment/Calculations:    CHA2DS2-VASc Score = 5   This indicates a 7.2% annual risk of stroke. The patient's score is based upon: CHF History: 1 HTN History: 1 Diabetes History: 0 Stroke History: 0 Vascular Disease History: 1 Age Score: 2 Gender Score: 0            Physical Exam:   VS:  BP 126/70   Pulse 60   Ht 5' 9.5" (1.765 m)   Wt 212 lb 6.4 oz (96.3 kg)   SpO2 95%   BMI 30.92 kg/m    Wt Readings from Last 3 Encounters:  02/28/23 212 lb 6.4 oz (96.3 kg)  12/26/22 211 lb 13.8 oz (96.1 kg)  12/07/22 211 lb 12.8 oz (96.1 kg)    Constitutional:      Appearance: Healthy appearance. Not in distress.  Neck:     Vascular: JVD normal.  Pulmonary:     Breath sounds: Normal breath sounds. No wheezing. No rales.  Cardiovascular:     Normal rate. Regular rhythm. Normal S1. Normal S2.      Murmurs: There is a grade 2/6 systolic murmur at the LLSB.  Edema:    Peripheral edema present.    Pretibial: bilateral trace edema of the pretibial area. Abdominal:     Palpations: Abdomen is soft.       ASSESSMENT AND PLAN:   Severe aortic stenosis S/p TAVR in  07/2022. F/u echocardiogram in 09/2022 with mild PVL. Continue SBE prophylaxis. Continue f/u with [facility] as planned.   PAF (paroxysmal atrial fibrillation) (HCC) Maintaining normal sinus rhythm by exam. He is tolerating anticoagulation well. His wt is > 60 kg and Creatinine has been < 1.5. Continue Amiodarone 200 mg once daily, Eliquis 5 mg twice daily  Arrange CMET, TSH, CBC F/u 6 mos.   CAD (coronary artery disease) High grade disease in dLAD by cath in 05/2022. PCI was not successful and he has been managed medically. He is doing well w/o chest pain to suggest angina.  He does not require aspirin as he is on Eliquis.  Continue atorvastatin 10 mg daily, metoprolol tartrate 25 mg daily, Cardizem 240 mg daily.  Follow-up 6 months.  (HFpEF) heart failure with preserved ejection fraction (HCC) EF  65-70 by echocardiogram November 2023.  He is mainly limited by back and joint issues.  NYHA IIb.  Volume status stable.  Continue furosemide 60 mg 4 times a week, K+ 20 mEq in the a.m. and 10 mEq in the p.m., spironolactone 25 mg daily.  Arrange follow-up CMET.  Follow-up 6 months.  HLD (hyperlipidemia) Goal LDL at least <70, ideally <22.  Continue atorvastatin 10 mg daily.  Arrange fasting CMET, lipids.  Mitral valve prolapse Mild to mod heart mitral regurgitation by echocardiogram November 2023.  Continue to monitor with serial echocardiograms.  Hypertension He has noted some blood pressures in the 140s at home.  Blood pressure is optimal here.  I have asked him to continue to monitor at home and notify us if his systolic pressures are consistently above 140.  Continue Cardizem 240 mg daily, metoprolol tartrate 25 mg daily, spironolactone 25 mg daily.         Dispo:  Return in about 6 months (around 08/30/2023) for Routine Follow Up with Dr. Clifton James.  Signed, Tereso Newcomer, PA-C

## 2023-02-28 ENCOUNTER — Encounter: Payer: Self-pay | Admitting: Physician Assistant

## 2023-02-28 ENCOUNTER — Ambulatory Visit: Payer: Medicare Other | Attending: Cardiovascular Disease | Admitting: Physician Assistant

## 2023-02-28 VITALS — BP 126/70 | HR 60 | Ht 69.5 in | Wt 212.4 lb

## 2023-02-28 DIAGNOSIS — I341 Nonrheumatic mitral (valve) prolapse: Secondary | ICD-10-CM | POA: Diagnosis not present

## 2023-02-28 DIAGNOSIS — I484 Atypical atrial flutter: Secondary | ICD-10-CM

## 2023-02-28 DIAGNOSIS — I5032 Chronic diastolic (congestive) heart failure: Secondary | ICD-10-CM | POA: Diagnosis not present

## 2023-02-28 DIAGNOSIS — I2510 Atherosclerotic heart disease of native coronary artery without angina pectoris: Secondary | ICD-10-CM

## 2023-02-28 DIAGNOSIS — I480 Paroxysmal atrial fibrillation: Secondary | ICD-10-CM

## 2023-02-28 DIAGNOSIS — I350 Nonrheumatic aortic (valve) stenosis: Secondary | ICD-10-CM | POA: Diagnosis not present

## 2023-02-28 DIAGNOSIS — I10 Essential (primary) hypertension: Secondary | ICD-10-CM | POA: Diagnosis not present

## 2023-02-28 DIAGNOSIS — E782 Mixed hyperlipidemia: Secondary | ICD-10-CM

## 2023-02-28 MED ORDER — APIXABAN 5 MG PO TABS: 5.0000 mg | ORAL_TABLET | Freq: Two times a day (BID) | ORAL | 3 refills | Status: DC

## 2023-02-28 MED ORDER — ATORVASTATIN CALCIUM 10 MG PO TABS: 10.0000 mg | ORAL_TABLET | Freq: Every morning | ORAL | 3 refills | Status: DC

## 2023-02-28 MED ORDER — METOPROLOL TARTRATE 25 MG PO TABS: 25.0000 mg | ORAL_TABLET | Freq: Every day | ORAL | 3 refills | Status: DC

## 2023-02-28 MED ORDER — AMIODARONE HCL 200 MG PO TABS: 200.0000 mg | ORAL_TABLET | Freq: Every day | ORAL | 3 refills | Status: DC

## 2023-02-28 MED ORDER — FUROSEMIDE 40 MG PO TABS: 60.0000 mg | ORAL_TABLET | ORAL | 11 refills | Status: DC

## 2023-02-28 MED ORDER — POTASSIUM CHLORIDE CRYS ER 20 MEQ PO TBCR: EXTENDED_RELEASE_TABLET | ORAL | 3 refills | Status: DC

## 2023-02-28 MED ORDER — SPIRONOLACTONE 25 MG PO TABS: 25.0000 mg | ORAL_TABLET | Freq: Every day | ORAL | 3 refills | Status: DC

## 2023-02-28 MED ORDER — DILTIAZEM HCL ER COATED BEADS 240 MG PO CP24: ORAL_CAPSULE | ORAL | 3 refills | Status: DC

## 2023-02-28 NOTE — Assessment & Plan Note (Signed)
Maintaining normal sinus rhythm by exam. He is tolerating anticoagulation well. His wt is > 60 kg and Creatinine has been < 1.5. Continue Amiodarone 200 mg once daily, Eliquis 5 mg twice daily  Arrange CMET, TSH, CBC F/u 6 mos.

## 2023-02-28 NOTE — Assessment & Plan Note (Signed)
Goal LDL at least <70, ideally <09.  Continue atorvastatin 10 mg daily.  Arrange fasting CMET, lipids.

## 2023-02-28 NOTE — Assessment & Plan Note (Signed)
EF 65-70 by echocardiogram November 2023.  He is mainly limited by back and joint issues.  NYHA IIb.  Volume status stable.  Continue furosemide 60 mg 4 times a week, K+ 20 mEq in the a.m. and 10 mEq in the p.m., spironolactone 25 mg daily.  Arrange follow-up CMET.  Follow-up 6 months.

## 2023-02-28 NOTE — Assessment & Plan Note (Signed)
He has noted some blood pressures in the 140s at home.  Blood pressure is optimal here.  I have asked him to continue to monitor at home and notify us if his systolic pressures are consistently above 140.  Continue Cardizem 240 mg daily, metoprolol tartrate 25 mg daily, spironolactone 25 mg daily.

## 2023-02-28 NOTE — Assessment & Plan Note (Signed)
S/p TAVR in 07/2022. F/u echocardiogram in 09/2022 with mild PVL. Continue SBE prophylaxis. [facility] as planned.

## 2023-02-28 NOTE — Assessment & Plan Note (Signed)
High grade disease in dLAD by cath in 05/2022. PCI was not successful and he has been managed medically. He is doing well w/o chest pain to suggest angina.  He does not require aspirin as he is on Eliquis.  Continue atorvastatin 10 mg daily, metoprolol tartrate 25 mg daily, Cardizem 240 mg daily.  Follow-up 6 months.

## 2023-02-28 NOTE — Patient Instructions (Signed)
Medication Instructions:  Your physician recommends that you continue on your current medications as directed. Please refer to the Current Medication list given to you today.  *If you need a refill on your cardiac medications before your next appointment, please call your pharmacy*   Lab Work: 03/07/23:  Come anytime after 7:15, FASTING:  CMET, LIPID, CBC, & TSH  If you have labs (blood work) drawn today and your tests are completely normal, you will receive your results only by: MyChart Message (if you have MyChart) OR A paper copy in the mail If you have any lab test that is abnormal or we need to change your treatment, we will call you to review the results.   Testing/Procedures: None ordered   Follow-Up: At [facility], you and your health needs are our priority.  As part of our continuing mission to provide you with exceptional heart care, we have created designated Provider Care Teams.  These Care Teams include your primary Cardiologist (physician) and Advanced Practice Providers (APPs -  Physician Assistants and Nurse Practitioners) who all work together to provide you with the care you need, when you need it.  We recommend signing up for the patient portal called "MyChart".  Sign up information is provided on this After Visit Summary.  MyChart is used to connect with patients for Virtual Visits (Telemedicine).  Patients are able to view lab/test results, encounter notes, upcoming appointments, etc.  Non-urgent messages can be sent to your provider as well.   To learn more about what you can do with MyChart, go to [email or web address].    Your next appointment:   6 month(s)  Provider:   Verne Carrow, MD     Other Instructions

## 2023-02-28 NOTE — Assessment & Plan Note (Signed)
Mild to mod heart mitral regurgitation by echocardiogram November 2023.  Continue to monitor with serial echocardiograms.

## 2023-03-07 ENCOUNTER — Ambulatory Visit: Payer: Medicare Other | Attending: Physician Assistant

## 2023-03-07 DIAGNOSIS — I5032 Chronic diastolic (congestive) heart failure: Secondary | ICD-10-CM | POA: Diagnosis not present

## 2023-03-07 DIAGNOSIS — I2510 Atherosclerotic heart disease of native coronary artery without angina pectoris: Secondary | ICD-10-CM

## 2023-03-07 DIAGNOSIS — I350 Nonrheumatic aortic (valve) stenosis: Secondary | ICD-10-CM

## 2023-03-07 DIAGNOSIS — I484 Atypical atrial flutter: Secondary | ICD-10-CM | POA: Diagnosis not present

## 2023-03-07 DIAGNOSIS — I480 Paroxysmal atrial fibrillation: Secondary | ICD-10-CM | POA: Diagnosis not present

## 2023-03-08 LAB — COMPREHENSIVE METABOLIC PANEL
ALT: 19 IU/L (ref 0–44)
AST: 16 IU/L (ref 0–40)
Albumin/Globulin Ratio: 2.1 (ref 1.2–2.2)
Albumin: 4.5 g/dL (ref 3.7–4.7)
Alkaline Phosphatase: 73 IU/L (ref 44–121)
BUN/Creatinine Ratio: 26 — ABNORMAL HIGH (ref 10–24)
BUN: 29 mg/dL — ABNORMAL HIGH (ref 8–27)
Bilirubin Total: 0.6 mg/dL (ref 0.0–1.2)
CO2: 23 mmol/L (ref 20–29)
Calcium: 9.8 mg/dL (ref 8.6–10.2)
Chloride: 104 mmol/L (ref 96–106)
Creatinine, Ser: 1.13 mg/dL (ref 0.76–1.27)
Globulin, Total: 2.1 g/dL (ref 1.5–4.5)
Glucose: 109 mg/dL — ABNORMAL HIGH (ref 70–99)
Potassium: 5 mmol/L (ref 3.5–5.2)
Sodium: 142 mmol/L (ref 134–144)
Total Protein: 6.6 g/dL (ref 6.0–8.5)
eGFR: 63 mL/min/{1.73_m2} (ref >59)

## 2023-03-08 LAB — LIPID PANEL
Chol/HDL Ratio: 2.3 ratio (ref 0.0–5.0)
Cholesterol, Total: 121 mg/dL (ref 100–199)
HDL: 53 mg/dL (ref >39)
LDL Chol Calc (NIH): 51 mg/dL (ref 0–99)
Triglycerides: 89 mg/dL (ref 0–149)
VLDL Cholesterol Cal: 17 mg/dL (ref 5–40)

## 2023-03-08 LAB — CBC
Hematocrit: 46.3 % (ref 37.5–51.0)
Hemoglobin: 15.5 g/dL (ref 13.0–17.7)
MCH: 34.1 pg — ABNORMAL HIGH (ref 26.6–33.0)
MCHC: 33.5 g/dL (ref 31.5–35.7)
MCV: 102 fL — ABNORMAL HIGH (ref 79–97)
Platelets: 188 10*3/uL (ref 150–450)
RBC: 4.54 x10E6/uL (ref 4.14–5.80)
RDW: 12.8 % (ref 11.6–15.4)
WBC: 6.6 10*3/uL (ref 3.4–10.8)

## 2023-03-08 LAB — TSH: TSH: 3.15 u[IU]/mL (ref 0.450–4.500)

## 2023-03-15 NOTE — Progress Notes (Signed)
Pt has been made aware of normal result and verbalized understanding.  jw

## 2023-03-20 ENCOUNTER — Other Ambulatory Visit: Payer: Self-pay | Admitting: Physician Assistant

## 2023-03-20 DIAGNOSIS — Z952 Presence of prosthetic heart valve: Secondary | ICD-10-CM

## 2023-03-27 DIAGNOSIS — M545 Low back pain, unspecified: Secondary | ICD-10-CM | POA: Diagnosis not present

## 2023-04-04 DIAGNOSIS — I480 Paroxysmal atrial fibrillation: Secondary | ICD-10-CM | POA: Diagnosis not present

## 2023-04-04 DIAGNOSIS — I10 Essential (primary) hypertension: Secondary | ICD-10-CM | POA: Diagnosis not present

## 2023-04-04 DIAGNOSIS — Z1331 Encounter for screening for depression: Secondary | ICD-10-CM | POA: Diagnosis not present

## 2023-04-04 DIAGNOSIS — D6869 Other thrombophilia: Secondary | ICD-10-CM | POA: Diagnosis not present

## 2023-04-04 DIAGNOSIS — N401 Enlarged prostate with lower urinary tract symptoms: Secondary | ICD-10-CM | POA: Diagnosis not present

## 2023-04-04 DIAGNOSIS — I2510 Atherosclerotic heart disease of native coronary artery without angina pectoris: Secondary | ICD-10-CM | POA: Diagnosis not present

## 2023-04-04 DIAGNOSIS — I5189 Other ill-defined heart diseases: Secondary | ICD-10-CM | POA: Diagnosis not present

## 2023-04-04 DIAGNOSIS — Z0000 Encounter for general adult medical examination without abnormal findings: Secondary | ICD-10-CM | POA: Diagnosis not present

## 2023-04-04 DIAGNOSIS — M545 Low back pain, unspecified: Secondary | ICD-10-CM | POA: Diagnosis not present

## 2023-04-04 DIAGNOSIS — Z952 Presence of prosthetic heart valve: Secondary | ICD-10-CM | POA: Diagnosis not present

## 2023-04-04 DIAGNOSIS — I700 Atherosclerosis of aorta: Secondary | ICD-10-CM | POA: Diagnosis not present

## 2023-04-04 DIAGNOSIS — G4733 Obstructive sleep apnea (adult) (pediatric): Secondary | ICD-10-CM | POA: Diagnosis not present

## 2023-04-05 DIAGNOSIS — L812 Freckles: Secondary | ICD-10-CM | POA: Diagnosis not present

## 2023-04-05 DIAGNOSIS — L821 Other seborrheic keratosis: Secondary | ICD-10-CM | POA: Diagnosis not present

## 2023-04-05 DIAGNOSIS — L570 Actinic keratosis: Secondary | ICD-10-CM | POA: Diagnosis not present

## 2023-04-05 DIAGNOSIS — Z85828 Personal history of other malignant neoplasm of skin: Secondary | ICD-10-CM | POA: Diagnosis not present

## 2023-04-05 DIAGNOSIS — L853 Xerosis cutis: Secondary | ICD-10-CM | POA: Diagnosis not present

## 2023-04-11 ENCOUNTER — Encounter: Payer: Self-pay | Admitting: Physical Medicine and Rehabilitation

## 2023-04-11 ENCOUNTER — Ambulatory Visit (INDEPENDENT_AMBULATORY_CARE_PROVIDER_SITE_OTHER): Payer: Medicare Other | Admitting: Physical Medicine and Rehabilitation

## 2023-04-11 DIAGNOSIS — G8929 Other chronic pain: Secondary | ICD-10-CM | POA: Diagnosis not present

## 2023-04-11 DIAGNOSIS — M47819 Spondylosis without myelopathy or radiculopathy, site unspecified: Secondary | ICD-10-CM

## 2023-04-11 DIAGNOSIS — M545 Low back pain, unspecified: Secondary | ICD-10-CM

## 2023-04-11 DIAGNOSIS — M47816 Spondylosis without myelopathy or radiculopathy, lumbar region: Secondary | ICD-10-CM | POA: Diagnosis not present

## 2023-04-11 NOTE — Progress Notes (Unsigned)
Patrick Franco - 89 y.o. male MRN [id number]  Date of birth: 1932/09/21  Office Visit Note: Visit Date: 04/11/2023 PCP: Kirby Funk, MD (Inactive) Referred by: Kirby Funk, MD  Subjective: Chief Complaint  Patient presents with   Lower Back - Pain   HPI: Patrick Franco is a 89 y.o. male who comes in today as a self referral for evaluation of chronic, worsening and severe right sided lower back pain. Pain ongoing for several years, worsened 4 weeks ago while fly fishing. His pain becomes severe with prolonged standing and walking. He describes pain as weakness and soreness, currently rates as 3 out of 10. Some relief of pain with home exercise regimen, rest and use of medications. Some relief of pain with oral Prednisone. Patient lives at [facility], does attend pilates and water aerobics regularly. Lumbar MRI imaging from 2018 exhibits lumbarization of S1 vertebral body, moderate lumbar levocurvature with apex at L3. There is multi level moderate facet arthropathy, extensive severe left facet hypertrophy at L5-S1. There is also multi level mild-to-moderate foraminal narrowing. No high grade spinal canal stenosis noted. Patient was previously treated by Dr. Antoine Primas with [facility], he underwent multiple epidural steroid injections at [facility], reports intermittent relief of pain with these injections. Most recent injection was left L3-L4 interlaminar epidural steroid injection in 2019. History of right total hip arthroplasty with Dr. Doneen Poisson in 2020. Patient does have significant cardiac history, underwent TAVR in 2023. History of atrial fibrillation, currently taking Eliquis. Patient intermittently uses cane to assist with ambulation. Patient denies focal weakness, numbness and tingling. No recent trauma or falls.     Oswestry Disability Index Score 30% 10 to 20 (40%) moderate disability: The patient experiences more pain and  difficulty with sitting, lifting and standing. Travel and social life are more difficult, and they may be disabled from work. Personal care, sexual activity and sleeping are not grossly affected, and the patient can usually be managed by conservative means.  Review of Systems  Musculoskeletal:  Positive for back pain.  Neurological:  Negative for tingling, sensory change, focal weakness and weakness.  All other systems reviewed and are negative.  Otherwise per HPI.  Assessment & Plan: Visit Diagnoses:    ICD-10-CM   1. Chronic right-sided low back pain without sciatica  M54.50 MR LUMBAR SPINE WO CONTRAST   G89.29     2. Spondylosis without myelopathy or radiculopathy  M47.819     3. Facet arthropathy, lumbar  M47.816 MR LUMBAR SPINE WO CONTRAST       Plan: Findings:  Chronic, worsening and severe right sided back pain. No radicular symptoms. Patient continues to have severe pain despite good conservative therapies such as home exercise regimen, rest and use of medications. Patients clinical presentation and exam are consistent with facet mediated pain. He does have pain with lumbar extension on exam today. I am also concerned about possible spinal canal stenosis, his pain does worsen significantly with prolonged standing and walking. Lumbar MRI imaging from 2018 does show severe facet hypertrophy. Next step is to obtain new lumbar MRI imaging. Depending on results of imaging we discussed possible facet joint injections vs lumbar epidural steroid injection. We will have patient follow up in our office for lumbar MRI review. I encouraged patient to remain active as tolerated. No red flag symptoms noted upon exam today.     Meds & Orders: No orders of the defined types were placed in this encounter.  Orders Placed This Encounter  Procedures   MR LUMBAR SPINE WO CONTRAST    Follow-up: Return for follow up for lumbar MRI review.   Procedures: No procedures performed      Clinical  History: EXAM: MRI LUMBAR SPINE WITHOUT CONTRAST   TECHNIQUE: Multiplanar, multisequence MR imaging of the lumbar spine was performed. No intravenous contrast was administered.   COMPARISON:  04/27/2016 CT abdomen and pelvis. 01/05/2010 MRI of the lumbar spine.   FINDINGS: Segmentation: Lumbarization of the S1 vertebral body with S1-2 disc. Bony anatomy better characterized on prior CT.   Alignment: Moderate lumbar levocurvature with apex at L3. No listhesis.   Vertebrae:  No fracture, evidence of discitis, or bone lesion.   Conus medullaris: Extends to the L1-2 level and appears normal.   Paraspinal and other soft tissues: Partially visualized T2 hyperintense foci in the kidneys bilaterally probably represent subcentimeter cysts.   Disc levels:   L1-2: Small disc bulge eccentric to the left. Right greater than left facet and ligamentum flavum hypertrophy. Mild left-sided foraminal narrowing. No significant canal stenosis.   L2-3: Small disc bulge with 4 mm right subarticular disc protrusion, large marginal osteophytes in the right foraminal and extraforaminal zone, and moderate right greater than left facet hypertrophy. Mild left and moderate right foraminal narrowing. Impingement of the descending right L3 nerve root in the lateral recess by disc protrusion and facet hypertrophy. Contact upon the exiting right L2 nerve root in the extraforaminal zone.   L3-4: Small disc bulge, large right foraminal and extraforaminal marginal osteophytes, severe right/moderate left facet hypertrophy. Moderate right and mild left foraminal narrowing. Effacement of right lateral recess with contact upon descending right L4 nerve root. Mild canal stenosis.   L4-5: Disc osteophyte complex with marginal osteophytes and severe bilateral facet hypertrophy. Severe right and moderate to severe left foraminal narrowing. Mild canal stenosis.   L5-S1: Small disc bulge with left-sided foraminal  and extraforaminal marginal osteophytes. Moderate right and extensive severe left facet hypertrophy. Severe left foraminal narrowing with contact on the exiting L5 nerve root in the extraforaminal zone. Moderate to severe right foraminal narrowing. No significant canal stenosis.   At S1-2 there is moderate right and severe left facet hypertrophy without significant foraminal narrowing or canal stenosis.   IMPRESSION: 1. No acute osseous abnormality. Transitional S1 vertebral body. Moderate levocurvature with apex at L3. 2. Lumbar spondylosis with extensive severe facet arthropathy. 3. Mild L3-4 and L4-5 canal stenosis.  No high-grade canal stenosis. 4. Multilevel mild-to-moderate foraminal narrowing with severe right L4, severe right/moderate to severe left L4-5, and severe left/moderate to severe right L5-S1 foraminal narrowing. 5. Contact upon nerve roots in lateral recesses and extraforaminal zones at multiple levels. Impingement of the descending right L3 nerve root in the right L2-3 level by a disc protrusion and facet hypertrophy.     Electronically Signed   By: Mitzi Hansen M.D.   On: 12/15/2016 14:09   He reports that he quit smoking about 63 years ago. His smoking use included cigarettes. He started smoking about 64 years ago. He smoked an average of .5 packs per day. He has never used smokeless tobacco. No results for input(s): "HGBA1C", "LABURIC" in the last 8760 hours.  Objective:  VS:  HT:    WT:   BMI:     BP:   HR: bpm  TEMP: ( )  RESP:  Physical Exam Vitals and nursing note reviewed.  HENT:     Head: Normocephalic and atraumatic.  Right Ear: External ear normal.     Left Ear: External ear normal.     Nose: Nose normal.     Mouth/Throat:     Mouth: Mucous membranes are moist.  Eyes:     Extraocular Movements: Extraocular movements intact.  Cardiovascular:     Rate and Rhythm: Normal rate.     Pulses: Normal pulses.  Pulmonary:      Effort: Pulmonary effort is normal.  Abdominal:     General: Abdomen is flat. There is no distension.  Musculoskeletal:        General: Tenderness present.     Cervical back: Normal range of motion.     Comments: Patient rises from seated position to standing without difficulty. Concordant low back pain with facet loading, lumbar spine extension and rotation. 5/5 strength noted with bilateral hip flexion, knee flexion/extension, ankle dorsiflexion/plantarflexion and EHL. No clonus noted bilaterally. No pain upon palpation of greater trochanters. No pain with internal/external rotation of bilateral hips. Sensation intact bilaterally. Negative slump test bilaterally. Ambulates with cane, gait slow.    Skin:    General: Skin is warm and dry.     Capillary Refill: Capillary refill takes less than 2 seconds.  Neurological:     Mental Status: He is alert and oriented to person, place, and time.     Gait: Gait abnormal.  Psychiatric:        Mood and Affect: Mood normal.        Behavior: Behavior normal.     Ortho Exam  Imaging: No results found.  Past Medical/Family/Surgical/Social History: Medications & Allergies reviewed per EMR, new medications updated. Patient Active Problem List   Diagnosis Date Noted   Hypercoagulable state due to persistent atrial fibrillation (HCC) 09/08/2022   Umbilical hernia with obstruction 08/23/2022   S/P TAVR (transcatheter aortic valve replacement) 07/26/2022   Severe aortic stenosis    HLD (hyperlipidemia) 12/02/2021   PAF (paroxysmal atrial fibrillation) (HCC) 05/05/2020   (HFpEF) heart failure with preserved ejection fraction (HCC) 03/08/2020   CAD (coronary artery disease) 03/08/2020   Atypical atrial flutter (HCC) 03/05/2020   Mitral valve prolapse 02/19/2020   Shortness of breath 02/19/2020   Tracheobronchomalacia 02/19/2020   Pulmonary air trapping 02/19/2020   Peripheral neuropathy 10/04/2019   Status post total replacement of right hip  08/13/2019   Unilateral primary osteoarthritis, right hip 06/26/2019   Arthritis of right hip 06/18/2019   Right hip pain 06/12/2019   Greater trochanteric bursitis of right hip 05/22/2019   Depression 02/21/2017   Degenerative arthritis of left knee 09/16/2016   Left knee pain 06/02/2016   Gout 04/12/2016   Obesity 02/09/2016   Spinal stenosis, lumbar region, with neurogenic claudication 02/09/2016   Muscle fatigue 09/21/2015   Gastric ulcer with hemorrhage 08/19/2015   Hypertension    Sleep apnea    Chronic low back pain    BPH (benign prostatic hyperplasia)    Gait disorder 02/03/2015   Foot drop, right 02/03/2015   Past Medical History:  Diagnosis Date   Aortic stenosis    Arthritis    Atrial fibrillation (HCC)    BPH (benign prostatic hyperplasia)    Chronic low back pain    Depression    Diverticulitis    Dyspnea    Elevated PSA    Foot drop, right    Gait disorder 02/03/2015   Hip pain    History of colonoscopy    History of nuclear stress test    Myoview 10/22: EF  67, no ischemia or infarction; low risk   HOH (hard of hearing)    hearing aid   Hypertension    Lumbar radiculopathy    Mitral valve prolapse    Onychomycosis    Peptic ulcer disease    S/P TAVR (transcatheter aortic valve replacement) 07/26/2022   26mm S3UR via TF approach with Dr. Clifton James and Dr. Charna Archer   Sepsis [facility]) 2005   e. coli-after prostate bx   Sleep apnea    Torn ACL    Tracheobronchomalacia    seen on 2019 Chest CT   Family History  Problem Relation Age of Onset   Pneumonia Mother 68   Cancer - Lung Father 49   Multiple myeloma Brother 34   Past Surgical History:  Procedure Laterality Date   BIOPSY  10/05/2021   Procedure: BIOPSY;  Surgeon: Kerin Salen, MD;  Location: [facility] ENDOSCOPY;  Service: Gastroenterology;;   CARDIAC CATHETERIZATION     CARDIOVERSION N/A 03/27/2020   Procedure: CARDIOVERSION;  Surgeon: Chilton Si, MD;  Location: [facility] ENDOSCOPY;  Service:  Cardiovascular;  Laterality: N/A;   CATARACT EXTRACTION Bilateral    ESOPHAGOGASTRODUODENOSCOPY N/A 08/19/2015   Procedure: ESOPHAGOGASTRODUODENOSCOPY (EGD);  Surgeon: Carman Ching, MD;  Location: [facility] ENDOSCOPY;  Service: Endoscopy;  Laterality: N/A;   ESOPHAGOGASTRODUODENOSCOPY N/A 10/05/2021   Procedure: ESOPHAGOGASTRODUODENOSCOPY (EGD);  Surgeon: Kerin Salen, MD;  Location: [facility] ENDOSCOPY;  Service: Gastroenterology;  Laterality: N/A;   INTRAOPERATIVE TRANSTHORACIC ECHOCARDIOGRAM N/A 07/26/2022   Procedure: INTRAOPERATIVE TRANSTHORACIC ECHOCARDIOGRAM;  Surgeon: Kathleene Hazel, MD;  Location: [facility] OR;  Service: Open Heart Surgery;  Laterality: N/A;   right knee replacement Right 1996   RIGHT/LEFT HEART CATH AND CORONARY ANGIOGRAPHY N/A 05/26/2022   Procedure: RIGHT/LEFT HEART CATH AND CORONARY ANGIOGRAPHY;  Surgeon: Kathleene Hazel, MD;  Location: [facility] INVASIVE CV LAB;  Service: Cardiovascular;  Laterality: N/A;   SHOULDER SURGERY     TEE WITHOUT CARDIOVERSION N/A 03/27/2020   Procedure: TRANSESOPHAGEAL ECHOCARDIOGRAM (TEE);  Surgeon: Chilton Si, MD;  Location: [facility] ENDOSCOPY;  Service: Cardiovascular;  Laterality: N/A;   TONSILLECTOMY     TOTAL HIP ARTHROPLASTY Right 08/13/2019   Procedure: RIGHT TOTAL HIP ARTHROPLASTY ANTERIOR APPROACH;  Surgeon: Kathryne Hitch, MD;  Location: [facility] OR;  Service: Orthopedics;  Laterality: Right;   TRANSCATHETER AORTIC VALVE REPLACEMENT, TRANSFEMORAL N/A 07/26/2022   Procedure: Transcatheter Aortic Valve Replacement, Transfemoral using a 26 MM Edwards SAPIEN 3 Ultra;  Surgeon: Kathleene Hazel, MD;  Location: [facility] OR;  Service: Open Heart Surgery;  Laterality: N/A;  Transfemoral approach   UMBILICAL HERNIA REPAIR N/A 08/25/2022   Procedure: UMBILICAL HERNIA REPAIR WITH POSSIBLE MESH;  Surgeon: Griselda Miner, MD;  Location: [facility] ORS;  Service: General;  Laterality: N/A;   Social History   Occupational History   Occupation: Retired  Forensic scientist  Tobacco Use   Smoking status: Former    Packs/day: .5    Types: Cigarettes    Start date: 1960    Quit date: 1961    Years since quitting: 63.4   Smokeless tobacco: Never   Tobacco comments:    Former smoker  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

## 2023-04-11 NOTE — Progress Notes (Unsigned)
Functional Pain Scale - descriptive words and definitions  Uncomfortable (3)  Pain is present but can complete all ADL's/sleep is slightly affected and passive distraction only gives marginal relief. Mild range order  Average Pain  varies  Lower back pain on right side with no radiation. Only has pain when walking and standing

## 2023-04-11 NOTE — Progress Notes (Signed)
   04/11/23 1304  Fall Screening  Falls in the past year? 1  Number of falls in past year 0  Was there an injury with Fall? 0  Fall Risk Category Calculator 1  Fall Risk  Patient at Risk for Falls Due to History of fall(s);Impaired mobility  Fall risk Follow up Falls evaluation completed

## 2023-04-13 DIAGNOSIS — N401 Enlarged prostate with lower urinary tract symptoms: Secondary | ICD-10-CM | POA: Diagnosis not present

## 2023-04-13 DIAGNOSIS — I480 Paroxysmal atrial fibrillation: Secondary | ICD-10-CM | POA: Diagnosis not present

## 2023-04-13 DIAGNOSIS — I5030 Unspecified diastolic (congestive) heart failure: Secondary | ICD-10-CM | POA: Diagnosis not present

## 2023-04-13 DIAGNOSIS — I10 Essential (primary) hypertension: Secondary | ICD-10-CM | POA: Diagnosis not present

## 2023-04-13 DIAGNOSIS — F329 Major depressive disorder, single episode, unspecified: Secondary | ICD-10-CM | POA: Diagnosis not present

## 2023-04-13 DIAGNOSIS — K219 Gastro-esophageal reflux disease without esophagitis: Secondary | ICD-10-CM | POA: Diagnosis not present

## 2023-05-08 DIAGNOSIS — M545 Low back pain, unspecified: Secondary | ICD-10-CM | POA: Diagnosis not present

## 2023-05-08 DIAGNOSIS — M25551 Pain in right hip: Secondary | ICD-10-CM | POA: Diagnosis not present

## 2023-05-12 DIAGNOSIS — M545 Low back pain, unspecified: Secondary | ICD-10-CM | POA: Diagnosis not present

## 2023-05-12 DIAGNOSIS — M25551 Pain in right hip: Secondary | ICD-10-CM | POA: Diagnosis not present

## 2023-05-15 DIAGNOSIS — M25551 Pain in right hip: Secondary | ICD-10-CM | POA: Diagnosis not present

## 2023-05-15 DIAGNOSIS — M545 Low back pain, unspecified: Secondary | ICD-10-CM | POA: Diagnosis not present

## 2023-05-22 DIAGNOSIS — M25551 Pain in right hip: Secondary | ICD-10-CM | POA: Diagnosis not present

## 2023-05-22 DIAGNOSIS — M545 Low back pain, unspecified: Secondary | ICD-10-CM | POA: Diagnosis not present

## 2023-05-24 ENCOUNTER — Ambulatory Visit
Admission: RE | Admit: 2023-05-24 | Discharge: 2023-05-24 | Disposition: A | Discharge status: Home / Self Care | Payer: Medicare Other | Source: Ambulatory Visit | Attending: Physical Medicine and Rehabilitation | Admitting: Physical Medicine and Rehabilitation

## 2023-05-24 DIAGNOSIS — M48061 Spinal stenosis, lumbar region without neurogenic claudication: Secondary | ICD-10-CM | POA: Diagnosis not present

## 2023-05-24 DIAGNOSIS — M47816 Spondylosis without myelopathy or radiculopathy, lumbar region: Secondary | ICD-10-CM | POA: Diagnosis not present

## 2023-05-24 DIAGNOSIS — M545 Low back pain, unspecified: Secondary | ICD-10-CM

## 2023-05-25 DIAGNOSIS — M545 Low back pain, unspecified: Secondary | ICD-10-CM | POA: Diagnosis not present

## 2023-05-25 DIAGNOSIS — M25551 Pain in right hip: Secondary | ICD-10-CM | POA: Diagnosis not present

## 2023-05-26 ENCOUNTER — Ambulatory Visit (INDEPENDENT_AMBULATORY_CARE_PROVIDER_SITE_OTHER): Payer: Medicare Other | Admitting: Physical Medicine and Rehabilitation

## 2023-05-26 DIAGNOSIS — G8929 Other chronic pain: Secondary | ICD-10-CM

## 2023-05-26 DIAGNOSIS — M545 Low back pain, unspecified: Secondary | ICD-10-CM | POA: Diagnosis not present

## 2023-05-26 DIAGNOSIS — M48061 Spinal stenosis, lumbar region without neurogenic claudication: Secondary | ICD-10-CM

## 2023-05-26 DIAGNOSIS — M47816 Spondylosis without myelopathy or radiculopathy, lumbar region: Secondary | ICD-10-CM

## 2023-05-26 DIAGNOSIS — M47819 Spondylosis without myelopathy or radiculopathy, site unspecified: Secondary | ICD-10-CM | POA: Diagnosis not present

## 2023-05-28 ENCOUNTER — Encounter: Payer: Self-pay | Admitting: Physical Medicine and Rehabilitation

## 2023-05-28 NOTE — Progress Notes (Signed)
Patrick Franco - 89 y.o. male MRN [id number]  Date of birth: 02-04-1932  Office Visit Note: Visit Date: 05/26/2023 PCP: Kirby Funk, MD (Inactive) Referred by: No ref. provider found  Subjective: Chief Complaint  Patient presents with   Lower Back - Follow-up   HPI: Patrick Franco is a 89 y.o. male who comes in today for evaluation of chronic, worsening and severe bilateral lower back pain, right greater than left. Pain ongoing for several years, worsened a few weeks ago while fishing. Pain worsens with standing to perform activities. He describes as soreness and weakness, currently rates as 8 out of 10. Some relief of pain with home exercise regimen, rest and use of medications. Recent lumbar MRI imaging exhibits lumbarized S1 segment, multi level facet arthropathy, right greater than left and moderate spinal canal stenosis at the level of L4-L5. Patient was previously treated by Dr. Antoine Primas with [facility], he underwent multiple epidural steroid injections at [facility], reports intermittent relief of pain with these injections. Most recent injection was left L3-L4 interlaminar epidural steroid injection in 2019. History of atrial fibrillation, currently taking Eliquis. Patient intermittently uses cane to assist with ambulation. Patient denies focal weakness, numbness and tingling. No recent trauma or falls. Currently resides at [facility].       Review of Systems  Musculoskeletal:  Positive for back pain.  Neurological:  Negative for tingling, sensory change, focal weakness and weakness.  All other systems reviewed and are negative.  Otherwise per HPI.  Assessment & Plan: Visit Diagnoses:    ICD-10-CM   1. Chronic bilateral low back pain without sciatica  M54.50 Ambulatory referral to Physical Medicine Rehab   G89.29     2. Spondylosis without myelopathy or radiculopathy  M47.819 Ambulatory referral to Physical Medicine Rehab    3.  Facet arthropathy, lumbar  M47.816 Ambulatory referral to Physical Medicine Rehab    4. Spinal stenosis of lumbar region without neurogenic claudication  M48.061 Ambulatory referral to Physical Medicine Rehab       Plan: Findings:  Chronic, worsening and severe bilateral lower back pain, right greater than left. Patient continues to have severe pain despite good conservative therapies such as home exercise regimen, rest and use of medications. I spent a great deal of time reviewing lumbar MRI today with patient and his wife using imaging and spine model. He does have 2 concerning issues, facet arthropathy and spinal canal stenosis. His pain today is more axial, no radicular symptoms down the legs. Patient is also able to walk distance without severe pain to his legs. His clinical presentation and exam are more consistent with facet mediated pain. No true symptoms of neurogenic claudication, although can't completely rule out stenosis at pain generator. His exam today is non focal, good strength noted to bilateral lower extremities. We discussed treatment plan in detail today, next step is to perform diagnostic and hopefully therapeutic bilateral L3-L4, L4-L5 and L5-S1 facet joint injections under fluoroscopic guidance. If good relief I did discuss possible longer sustained pain relief with radiofrequency ablation procedure. If pain persists we would consider lumbar epidural steroid injection, possible transforaminal approach at the level of stenosis. No ref flag symptoms noted upon exam today.     Meds & Orders: No orders of the defined types were placed in this encounter.   Orders Placed This Encounter  Procedures   Ambulatory referral to Physical Medicine Rehab    Follow-up: Return for Bilateral L3-L4, L4-L5 and  L5-S1 facet joint injections.   Procedures: No procedures performed      Clinical History: CLINICAL DATA:  Low back pain, symptoms persist with > 6 wks treatment   EXAM: MRI  LUMBAR SPINE WITHOUT CONTRAST   TECHNIQUE: Multiplanar, multisequence MR imaging of the lumbar spine was performed. No intravenous contrast was administered.   COMPARISON:  MRI 12/15/2016   FINDINGS: Segmentation: Transitional lumbosacral anatomy with lumbarization of the S1 segment. Lowest rib-bearing segment labeled as T12. Same numbering scheme as utilized on the previous MRI report.   Alignment: Prominent lumbar levocurvature centered at L2-L3. No significant listhesis within the sagittal plane.   Vertebrae:  No fracture, evidence of discitis, or bone lesion.   Conus medullaris and cauda equina: Conus extends to the L1-2 level. Conus and cauda equina appear normal.   Paraspinal and other soft tissues: Extensive colonic diverticulosis. No acute abnormality. Posterior paraspinal muscle atrophy.   Disc levels:   L1-L2: Mild annular disc bulge. Right greater than left facet arthropathy. No canal stenosis. Mild right foraminal stenosis. Slight interval progression.   L2-L3: Broad-based disc bulge with endplate spurring, eccentric to the right. Right greater than left facet arthropathy. Right-sided subarticular recess stenosis without significant canal stenosis. Moderate right foraminal stenosis. No significant interval progression.   L3-L4: Disc bulge and endplate spurring, eccentric to the right. Right greater than left facet arthropathy. Right-sided subarticular recess stenosis and mild canal stenosis. Moderate-severe right and mild left foraminal stenosis. Slight interval progression.   L4-L5: Disc bulge and endplate spurring. Advanced bilateral facet arthropathy. Bilateral subarticular recess stenosis with moderate canal stenosis. Severe right and moderate-severe left foraminal stenosis. Findings have progressed from prior.   L5-S1: Mild disc bulge and endplate spurring, eccentric to the left. Bilateral facet arthropathy. No canal stenosis. Severe left and moderate  right foraminal stenosis. No significant interval progression.   IMPRESSION: 1. Transitional lumbosacral anatomy with lumbarization of the S1 segment. 2. Multilevel lumbar spondylosis, progressed since 2018. 3. Moderate canal stenosis at L4-L5 with severe right and moderate-severe left foraminal stenosis. 4. Mild canal stenosis at L3-L4 with moderate-severe right foraminal stenosis. 5. Severe left and moderate right foraminal stenosis at L5-S1.     Electronically Signed   By: Duanne Guess D.O.   On: 05/26/2023 13:04   He reports that he quit smoking about 63 years ago. His smoking use included cigarettes. He started smoking about 64 years ago. He has a 0.5 pack-year smoking history. He has never used smokeless tobacco. No results for input(s): "HGBA1C", "LABURIC" in the last 8760 hours.  Objective:  VS:  HT:    WT:   BMI:     BP:   HR: bpm  TEMP: ( )  RESP:  Physical Exam Vitals and nursing note reviewed.  HENT:     Head: Normocephalic and atraumatic.     Right Ear: External ear normal.     Left Ear: External ear normal.     Nose: Nose normal.     Mouth/Throat:     Mouth: Mucous membranes are moist.  Eyes:     Extraocular Movements: Extraocular movements intact.  Cardiovascular:     Rate and Rhythm: Normal rate.     Pulses: Normal pulses.  Pulmonary:     Effort: Pulmonary effort is normal.  Abdominal:     General: Abdomen is flat. There is no distension.  Musculoskeletal:        General: Tenderness present.     Cervical back: Normal range of motion.  Comments: Patient rises from seated position to standing without difficulty. Pain noted with facet loading and lumbar extension. 5/5 strength noted with bilateral hip flexion, knee flexion/extension, ankle dorsiflexion/plantarflexion and EHL. No clonus noted bilaterally. No pain upon palpation of greater trochanters. No pain with internal/external rotation of bilateral hips. Sensation intact bilaterally. Negative  slump test bilaterally. Patient uses cane to assist with ambulation.   Skin:    General: Skin is warm and dry.     Capillary Refill: Capillary refill takes less than 2 seconds.  Neurological:     Mental Status: He is alert and oriented to person, place, and time.     Gait: Gait abnormal.  Psychiatric:        Mood and Affect: Mood normal.        Behavior: Behavior normal.     Ortho Exam  Imaging: No results found.  Past Medical/Family/Surgical/Social History: Medications & Allergies reviewed per EMR, new medications updated. Patient Active Problem List   Diagnosis Date Noted   Hypercoagulable state due to persistent atrial fibrillation (HCC) 09/08/2022   Umbilical hernia with obstruction 08/23/2022   S/P TAVR (transcatheter aortic valve replacement) 07/26/2022   Severe aortic stenosis    HLD (hyperlipidemia) 12/02/2021   PAF (paroxysmal atrial fibrillation) (HCC) 05/05/2020   (HFpEF) heart failure with preserved ejection fraction (HCC) 03/08/2020   CAD (coronary artery disease) 03/08/2020   Atypical atrial flutter (HCC) 03/05/2020   Mitral valve prolapse 02/19/2020   Shortness of breath 02/19/2020   Tracheobronchomalacia 02/19/2020   Pulmonary air trapping 02/19/2020   Peripheral neuropathy 10/04/2019   Status post total replacement of right hip 08/13/2019   Unilateral primary osteoarthritis, right hip 06/26/2019   Arthritis of right hip 06/18/2019   Right hip pain 06/12/2019   Greater trochanteric bursitis of right hip 05/22/2019   Depression 02/21/2017   Degenerative arthritis of left knee 09/16/2016   Left knee pain 06/02/2016   Gout 04/12/2016   Obesity 02/09/2016   Spinal stenosis, lumbar region, with neurogenic claudication 02/09/2016   Muscle fatigue 09/21/2015   Gastric ulcer with hemorrhage 08/19/2015   Hypertension    Sleep apnea    Chronic low back pain    BPH (benign prostatic hyperplasia)    Gait disorder 02/03/2015   Foot drop, right 02/03/2015    Past Medical History:  Diagnosis Date   Aortic stenosis    Arthritis    Atrial fibrillation (HCC)    BPH (benign prostatic hyperplasia)    Chronic low back pain    Depression    Diverticulitis    Dyspnea    Elevated PSA    Foot drop, right    Gait disorder 02/03/2015   Hip pain    History of colonoscopy    History of nuclear stress test    Myoview 10/22: EF 67, no ischemia or infarction; low risk   HOH (hard of hearing)    hearing aid   Hypertension    Lumbar radiculopathy    Mitral valve prolapse    Onychomycosis    Peptic ulcer disease    S/P TAVR (transcatheter aortic valve replacement) 07/26/2022   26mm S3UR via TF approach with Dr. Clifton James and Dr. Charna Archer   Sepsis [facility]) 2005   e. coli-after prostate bx   Sleep apnea    Torn ACL    Tracheobronchomalacia    seen on 2019 Chest CT   Family History  Problem Relation Age of Onset   Pneumonia Mother 61   Cancer - Lung Father  56   Multiple myeloma Brother 55   Past Surgical History:  Procedure Laterality Date   BIOPSY  10/05/2021   Procedure: BIOPSY;  Surgeon: Kerin Salen, MD;  Location: [facility];  Service: Gastroenterology;;   CARDIAC CATHETERIZATION     CARDIOVERSION N/A 03/27/2020   Procedure: CARDIOVERSION;  Surgeon: Chilton Si, MD;  Location: [facility] ENDOSCOPY;  Service: Cardiovascular;  Laterality: N/A;   CATARACT EXTRACTION Bilateral    ESOPHAGOGASTRODUODENOSCOPY N/A 08/19/2015   Procedure: ESOPHAGOGASTRODUODENOSCOPY (EGD);  Surgeon: Carman Ching, MD;  Location: [facility] ENDOSCOPY;  Service: Endoscopy;  Laterality: N/A;   ESOPHAGOGASTRODUODENOSCOPY N/A 10/05/2021   Procedure: ESOPHAGOGASTRODUODENOSCOPY (EGD);  Surgeon: Kerin Salen, MD;  Location: [facility];  Service: Gastroenterology;  Laterality: N/A;   INTRAOPERATIVE TRANSTHORACIC ECHOCARDIOGRAM N/A 07/26/2022   Procedure: INTRAOPERATIVE TRANSTHORACIC ECHOCARDIOGRAM;  Surgeon: Kathleene Hazel, MD;  Location: [facility] OR;  Service: Open Heart  Surgery;  Laterality: N/A;   right knee replacement Right 1996   RIGHT/LEFT HEART CATH AND CORONARY ANGIOGRAPHY N/A 05/26/2022   Procedure: RIGHT/LEFT HEART CATH AND CORONARY ANGIOGRAPHY;  Surgeon: Kathleene Hazel, MD;  Location: [facility] INVASIVE CV LAB;  Service: Cardiovascular;  Laterality: N/A;   SHOULDER SURGERY     TEE WITHOUT CARDIOVERSION N/A 03/27/2020   Procedure: TRANSESOPHAGEAL ECHOCARDIOGRAM (TEE);  Surgeon: Chilton Si, MD;  Location: [facility] ENDOSCOPY;  Service: Cardiovascular;  Laterality: N/A;   TONSILLECTOMY     TOTAL HIP ARTHROPLASTY Right 08/13/2019   Procedure: RIGHT TOTAL HIP ARTHROPLASTY ANTERIOR APPROACH;  Surgeon: Kathryne Hitch, MD;  Location: [facility] OR;  Service: Orthopedics;  Laterality: Right;   TRANSCATHETER AORTIC VALVE REPLACEMENT, TRANSFEMORAL N/A 07/26/2022   Procedure: Transcatheter Aortic Valve Replacement, Transfemoral using a 26 MM Edwards SAPIEN 3 Ultra;  Surgeon: Kathleene Hazel, MD;  Location: [facility] OR;  Service: Open Heart Surgery;  Laterality: N/A;  Transfemoral approach   UMBILICAL HERNIA REPAIR N/A 08/25/2022   Procedure: UMBILICAL HERNIA REPAIR WITH POSSIBLE MESH;  Surgeon: Griselda Miner, MD;  Location: [facility] ORS;  Service: General;  Laterality: N/A;   Social History   Occupational History   Occupation: Retired Forensic scientist  Tobacco Use   Smoking status: Former    Current packs/day: 0.00    Average packs/day: 0.5 packs/day for 1 year (0.5 ttl pk-yrs)    Types: Cigarettes    Start date: 67    Quit date: 1961    Years since quitting: 63.5   Smokeless tobacco: Never   Tobacco comments:    Former smoker  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

## 2023-05-29 ENCOUNTER — Ambulatory Visit: Payer: BLUE CROSS/BLUE SHIELD | Admitting: Physical Medicine and Rehabilitation

## 2023-06-01 ENCOUNTER — Other Ambulatory Visit: Payer: Self-pay | Admitting: Cardiovascular Disease

## 2023-06-01 DIAGNOSIS — M25551 Pain in right hip: Secondary | ICD-10-CM | POA: Diagnosis not present

## 2023-06-01 DIAGNOSIS — I484 Atypical atrial flutter: Secondary | ICD-10-CM

## 2023-06-01 DIAGNOSIS — M545 Low back pain, unspecified: Secondary | ICD-10-CM | POA: Diagnosis not present

## 2023-06-01 NOTE — Telephone Encounter (Signed)
Prescription refill request for Eliquis received. Indication: Afib  Last office visit: 02/28/23 Alben Spittle)  Scr: 1.13 (03/07/23)  Age: 89 Weight: 96.3kg  Appropriate dose. Refill sent.

## 2023-06-05 DIAGNOSIS — M545 Low back pain, unspecified: Secondary | ICD-10-CM | POA: Diagnosis not present

## 2023-06-05 DIAGNOSIS — M25551 Pain in right hip: Secondary | ICD-10-CM | POA: Diagnosis not present

## 2023-06-07 DIAGNOSIS — M25551 Pain in right hip: Secondary | ICD-10-CM | POA: Diagnosis not present

## 2023-06-07 DIAGNOSIS — M545 Low back pain, unspecified: Secondary | ICD-10-CM | POA: Diagnosis not present

## 2023-06-12 DIAGNOSIS — M25551 Pain in right hip: Secondary | ICD-10-CM | POA: Diagnosis not present

## 2023-06-12 DIAGNOSIS — M545 Low back pain, unspecified: Secondary | ICD-10-CM | POA: Diagnosis not present

## 2023-06-15 ENCOUNTER — Ambulatory Visit (INDEPENDENT_AMBULATORY_CARE_PROVIDER_SITE_OTHER): Payer: Medicare Other | Admitting: Physical Medicine and Rehabilitation

## 2023-06-15 ENCOUNTER — Other Ambulatory Visit: Payer: Self-pay

## 2023-06-15 VITALS — BP 155/74 | HR 66

## 2023-06-15 DIAGNOSIS — M47816 Spondylosis without myelopathy or radiculopathy, lumbar region: Secondary | ICD-10-CM | POA: Diagnosis not present

## 2023-06-15 MED ORDER — METHYLPREDNISOLONE ACETATE 80 MG/ML IJ SUSP
80.0000 mg | Freq: Once | INTRAMUSCULAR | Status: AC
  Administered 2023-06-15: 80 mg

## 2023-06-15 NOTE — Patient Instructions (Signed)

## 2023-06-15 NOTE — Progress Notes (Signed)
Functional Pain Scale - descriptive words and definitions  Moderate (4)   Constantly aware of pain, can complete ADLs with modification/sleep marginally affected at times/passive distraction is of no use, but active distraction gives some relief. Moderate range order  Average Pain  varies depending on activity   +Driver, -BT, -Dye Allergies.  Lower back pain on both sides with no radiation. Pain increases when standing or walking. No pain with sitting or lying down

## 2023-06-19 DIAGNOSIS — M545 Low back pain, unspecified: Secondary | ICD-10-CM | POA: Diagnosis not present

## 2023-06-19 DIAGNOSIS — M25551 Pain in right hip: Secondary | ICD-10-CM | POA: Diagnosis not present

## 2023-06-21 DIAGNOSIS — M545 Low back pain, unspecified: Secondary | ICD-10-CM | POA: Diagnosis not present

## 2023-06-21 DIAGNOSIS — M25551 Pain in right hip: Secondary | ICD-10-CM | POA: Diagnosis not present

## 2023-06-21 NOTE — Progress Notes (Signed)
Patrick Franco - 89 y.o. male MRN [id number]  Date of birth: 11/24/1931  Office Visit Note: Visit Date: 06/15/2023 PCP: Kirby Funk, MD (Inactive) Referred by: Juanda Chance, NP  Subjective: Chief Complaint  Patient presents with   Lower Back - Pain   HPI:  Patrick Franco is a 89 y.o. male who comes in today at the request of Ellin Goodie, FNP for planned Bilateral  L3-4, L4-5, and L5-S1 Lumbar facet/medial branch block with fluoroscopic guidance.  The patient has failed conservative care including home exercise, medications, time and activity modification.  This injection will be diagnostic and hopefully therapeutic.  Please see requesting physician notes for further details and justification.  Exam has shown concordant pain with facet joint loading.   ROS Otherwise per HPI.  Assessment & Plan: Visit Diagnoses:    ICD-10-CM   1. Spondylosis without myelopathy or radiculopathy, lumbar region  M47.816 XR C-ARM NO REPORT    Facet Injection    methylPREDNISolone acetate (DEPO-MEDROL) injection 80 mg      Plan: No additional findings.   Meds & Orders:  Meds ordered this encounter  Medications   methylPREDNISolone acetate (DEPO-MEDROL) injection 80 mg    Orders Placed This Encounter  Procedures   Facet Injection   XR C-ARM NO REPORT    Follow-up: Return for visit to requesting provider as needed.   Procedures: No procedures performed  Lumbar Facet Joint Intra-Articular Injection(s) with Fluoroscopic Guidance  Patient: Patrick Franco      Date of Birth: [birth year] MRN: [id number] PCP: Kirby Funk, MD (Inactive)      Visit Date: 06/15/2023   Universal Protocol:    Date/Time: 06/15/2023  Consent Given By: the patient  Position: PRONE   Additional Comments: Vital signs were monitored before and after the procedure. Patient was prepped and draped in the usual sterile fashion. The correct patient, procedure, and site was verified.   Injection Procedure Details:   Procedure Site One Meds Administered:  Meds ordered this encounter  Medications   methylPREDNISolone acetate (DEPO-MEDROL) injection 80 mg     Laterality: Bilateral  Location/Site:  L3-L4 L4-L5 L5-S1  Needle size: 22 guage  Needle type: Spinal  Needle Placement: Articular  Findings:  -Comments: Excellent flow of contrast producing a partial arthrogram.  Procedure Details: The fluoroscope beam is vertically oriented in AP, and the inferior recess is visualized beneath the lower pole of the inferior apophyseal process, which represents the target point for needle insertion. When direct visualization is difficult the target point is located at the medial projection of the vertebral pedicle. The region overlying each aforementioned target is locally anesthetized with a 1 to 2 ml. volume of 1% Lidocaine without Epinephrine.   The spinal needle was inserted into each of the above mentioned facet joints using biplanar fluoroscopic guidance. A 0.25 to 0.5 ml. volume of Isovue-250 was injected and a partial facet joint arthrogram was obtained. A single spot film was obtained of the resulting arthrogram.    One to 1.25 ml of the steroid/anesthetic solution was then injected into each of the facet joints noted above.   Additional Comments:  No complications occurred Dressing: 2 x 2 sterile gauze and Band-Aid    Post-procedure details: Patient was observed during the procedure. Post-procedure instructions were reviewed.  Patient left the clinic in stable condition.    Clinical History: CLINICAL DATA:  Low back pain, symptoms persist with > 6 wks treatment   EXAM: MRI LUMBAR SPINE WITHOUT CONTRAST  TECHNIQUE: Multiplanar, multisequence MR imaging of the lumbar spine was performed. No intravenous contrast was administered.   COMPARISON:  MRI 12/15/2016   FINDINGS: Segmentation: Transitional lumbosacral anatomy with lumbarization of the S1 segment. Lowest rib-bearing  segment labeled as T12. Same numbering scheme as utilized on the previous MRI report.   Alignment: Prominent lumbar levocurvature centered at L2-L3. No significant listhesis within the sagittal plane.   Vertebrae:  No fracture, evidence of discitis, or bone lesion.   Conus medullaris and cauda equina: Conus extends to the L1-2 level. Conus and cauda equina appear normal.   Paraspinal and other soft tissues: Extensive colonic diverticulosis. No acute abnormality. Posterior paraspinal muscle atrophy.   Disc levels:   L1-L2: Mild annular disc bulge. Right greater than left facet arthropathy. No canal stenosis. Mild right foraminal stenosis. Slight interval progression.   L2-L3: Broad-based disc bulge with endplate spurring, eccentric to the right. Right greater than left facet arthropathy. Right-sided subarticular recess stenosis without significant canal stenosis. Moderate right foraminal stenosis. No significant interval progression.   L3-L4: Disc bulge and endplate spurring, eccentric to the right. Right greater than left facet arthropathy. Right-sided subarticular recess stenosis and mild canal stenosis. Moderate-severe right and mild left foraminal stenosis. Slight interval progression.   L4-L5: Disc bulge and endplate spurring. Advanced bilateral facet arthropathy. Bilateral subarticular recess stenosis with moderate canal stenosis. Severe right and moderate-severe left foraminal stenosis. Findings have progressed from prior.   L5-S1: Mild disc bulge and endplate spurring, eccentric to the left. Bilateral facet arthropathy. No canal stenosis. Severe left and moderate right foraminal stenosis. No significant interval progression.   IMPRESSION: 1. Transitional lumbosacral anatomy with lumbarization of the S1 segment. 2. Multilevel lumbar spondylosis, progressed since 2018. 3. Moderate canal stenosis at L4-L5 with severe right and moderate-severe left foraminal  stenosis. 4. Mild canal stenosis at L3-L4 with moderate-severe right foraminal stenosis. 5. Severe left and moderate right foraminal stenosis at L5-S1.     Electronically Signed   By: Duanne Guess D.O.   On: 05/26/2023 13:04     Objective:  VS:  HT:    WT:   BMI:     BP:(!) 155/74  HR:66bpm  TEMP: ( )  RESP:  Physical Exam Vitals and nursing note reviewed.  Constitutional:      General: He is not in acute distress.    Appearance: Normal appearance. He is not ill-appearing.  HENT:     Head: Normocephalic and atraumatic.     Right Ear: External ear normal.     Left Ear: External ear normal.     Nose: No congestion.  Eyes:     Extraocular Movements: Extraocular movements intact.  Cardiovascular:     Rate and Rhythm: Normal rate.     Pulses: Normal pulses.  Pulmonary:     Effort: Pulmonary effort is normal. No respiratory distress.  Abdominal:     General: There is no distension.     Palpations: Abdomen is soft.  Musculoskeletal:        General: No tenderness or signs of injury.     Cervical back: Neck supple.     Right lower leg: No edema.     Left lower leg: No edema.     Comments: Patient has good distal strength without clonus.  Skin:    Findings: No erythema or rash.  Neurological:     General: No focal deficit present.     Mental Status: He is alert and oriented to person, place, and time.  Sensory: No sensory deficit.     Motor: No weakness or abnormal muscle tone.     Coordination: Coordination normal.  Psychiatric:        Mood and Affect: Mood normal.        Behavior: Behavior normal.      Imaging: No results found.

## 2023-06-21 NOTE — Procedures (Signed)
Lumbar Facet Joint Intra-Articular Injection(s) with Fluoroscopic Guidance  Patient: Patrick Franco      Date of Birth: 10-03-1932 MRN: [id number] PCP: Kirby Funk, MD (Inactive)      Visit Date: 06/15/2023   Universal Protocol:    Date/Time: 06/15/2023  Consent Given By: the patient  Position: PRONE   Additional Comments: Vital signs were monitored before and after the procedure. Patient was prepped and draped in the usual sterile fashion. The correct patient, procedure, and site was verified.   Injection Procedure Details:  Procedure Site One Meds Administered:  Meds ordered this encounter  Medications   methylPREDNISolone acetate (DEPO-MEDROL) injection 80 mg     Laterality: Bilateral  Location/Site:  L3-L4 L4-L5 L5-S1  Needle size: 22 guage  Needle type: Spinal  Needle Placement: Articular  Findings:  -Comments: Excellent flow of contrast producing a partial arthrogram.  Procedure Details: The fluoroscope beam is vertically oriented in AP, and the inferior recess is visualized beneath the lower pole of the inferior apophyseal process, which represents the target point for needle insertion. When direct visualization is difficult the target point is located at the medial projection of the vertebral pedicle. The region overlying each aforementioned target is locally anesthetized with a 1 to 2 ml. volume of 1% Lidocaine without Epinephrine.   The spinal needle was inserted into each of the above mentioned facet joints using biplanar fluoroscopic guidance. A 0.25 to 0.5 ml. volume of Isovue-250 was injected and a partial facet joint arthrogram was obtained. A single spot film was obtained of the resulting arthrogram.    One to 1.25 ml of the steroid/anesthetic solution was then injected into each of the facet joints noted above.   Additional Comments:  No complications occurred Dressing: 2 x 2 sterile gauze and Band-Aid    Post-procedure details: Patient was  observed during the procedure. Post-procedure instructions were reviewed.  Patient left the clinic in stable condition.

## 2023-06-26 DIAGNOSIS — M25551 Pain in right hip: Secondary | ICD-10-CM | POA: Diagnosis not present

## 2023-06-26 DIAGNOSIS — M545 Low back pain, unspecified: Secondary | ICD-10-CM | POA: Diagnosis not present

## 2023-06-28 DIAGNOSIS — M545 Low back pain, unspecified: Secondary | ICD-10-CM | POA: Diagnosis not present

## 2023-06-28 DIAGNOSIS — M25551 Pain in right hip: Secondary | ICD-10-CM | POA: Diagnosis not present

## 2023-07-03 DIAGNOSIS — M545 Low back pain, unspecified: Secondary | ICD-10-CM | POA: Diagnosis not present

## 2023-07-03 DIAGNOSIS — M25551 Pain in right hip: Secondary | ICD-10-CM | POA: Diagnosis not present

## 2023-07-05 DIAGNOSIS — H353131 Nonexudative age-related macular degeneration, bilateral, early dry stage: Secondary | ICD-10-CM | POA: Diagnosis not present

## 2023-07-05 DIAGNOSIS — M25551 Pain in right hip: Secondary | ICD-10-CM | POA: Diagnosis not present

## 2023-07-05 DIAGNOSIS — H5202 Hypermetropia, left eye: Secondary | ICD-10-CM | POA: Diagnosis not present

## 2023-07-05 DIAGNOSIS — Z9849 Cataract extraction status, unspecified eye: Secondary | ICD-10-CM | POA: Diagnosis not present

## 2023-07-05 DIAGNOSIS — H524 Presbyopia: Secondary | ICD-10-CM | POA: Diagnosis not present

## 2023-07-05 DIAGNOSIS — Z961 Presence of intraocular lens: Secondary | ICD-10-CM | POA: Diagnosis not present

## 2023-07-05 DIAGNOSIS — M545 Low back pain, unspecified: Secondary | ICD-10-CM | POA: Diagnosis not present

## 2023-07-05 DIAGNOSIS — H52223 Regular astigmatism, bilateral: Secondary | ICD-10-CM | POA: Diagnosis not present

## 2023-07-05 DIAGNOSIS — H5211 Myopia, right eye: Secondary | ICD-10-CM | POA: Diagnosis not present

## 2023-07-05 DIAGNOSIS — H04123 Dry eye syndrome of bilateral lacrimal glands: Secondary | ICD-10-CM | POA: Diagnosis not present

## 2023-07-05 DIAGNOSIS — H3530 Unspecified macular degeneration: Secondary | ICD-10-CM | POA: Diagnosis not present

## 2023-07-11 DIAGNOSIS — I110 Hypertensive heart disease with heart failure: Secondary | ICD-10-CM | POA: Diagnosis not present

## 2023-07-11 DIAGNOSIS — I480 Paroxysmal atrial fibrillation: Secondary | ICD-10-CM | POA: Diagnosis not present

## 2023-07-11 DIAGNOSIS — E669 Obesity, unspecified: Secondary | ICD-10-CM | POA: Diagnosis not present

## 2023-07-11 DIAGNOSIS — K56601 Complete intestinal obstruction, unspecified as to cause: Secondary | ICD-10-CM | POA: Diagnosis not present

## 2023-07-11 DIAGNOSIS — E871 Hypo-osmolality and hyponatremia: Secondary | ICD-10-CM | POA: Diagnosis not present

## 2023-07-11 DIAGNOSIS — K56600 Partial intestinal obstruction, unspecified as to cause: Secondary | ICD-10-CM | POA: Diagnosis not present

## 2023-07-11 DIAGNOSIS — K56609 Unspecified intestinal obstruction, unspecified as to partial versus complete obstruction: Secondary | ICD-10-CM | POA: Diagnosis not present

## 2023-07-11 DIAGNOSIS — I4719 Other supraventricular tachycardia: Secondary | ICD-10-CM | POA: Diagnosis not present

## 2023-07-11 DIAGNOSIS — Z7901 Long term (current) use of anticoagulants: Secondary | ICD-10-CM | POA: Diagnosis not present

## 2023-07-11 DIAGNOSIS — I509 Heart failure, unspecified: Secondary | ICD-10-CM | POA: Diagnosis not present

## 2023-07-11 DIAGNOSIS — M479 Spondylosis, unspecified: Secondary | ICD-10-CM | POA: Diagnosis not present

## 2023-07-11 DIAGNOSIS — E785 Hyperlipidemia, unspecified: Secondary | ICD-10-CM | POA: Diagnosis not present

## 2023-07-11 DIAGNOSIS — I4891 Unspecified atrial fibrillation: Secondary | ICD-10-CM | POA: Diagnosis not present

## 2023-07-11 DIAGNOSIS — Z954 Presence of other heart-valve replacement: Secondary | ICD-10-CM | POA: Diagnosis not present

## 2023-07-11 DIAGNOSIS — Z96641 Presence of right artificial hip joint: Secondary | ICD-10-CM | POA: Diagnosis not present

## 2023-07-11 DIAGNOSIS — Z79899 Other long term (current) drug therapy: Secondary | ICD-10-CM | POA: Diagnosis not present

## 2023-07-11 DIAGNOSIS — K7689 Other specified diseases of liver: Secondary | ICD-10-CM | POA: Diagnosis not present

## 2023-07-11 DIAGNOSIS — E860 Dehydration: Secondary | ICD-10-CM | POA: Diagnosis not present

## 2023-07-11 DIAGNOSIS — K769 Liver disease, unspecified: Secondary | ICD-10-CM | POA: Diagnosis not present

## 2023-07-11 DIAGNOSIS — Z96651 Presence of right artificial knee joint: Secondary | ICD-10-CM | POA: Diagnosis not present

## 2023-07-11 DIAGNOSIS — G8929 Other chronic pain: Secondary | ICD-10-CM | POA: Diagnosis not present

## 2023-07-11 DIAGNOSIS — R1030 Lower abdominal pain, unspecified: Secondary | ICD-10-CM | POA: Diagnosis not present

## 2023-07-11 DIAGNOSIS — Z4659 Encounter for fitting and adjustment of other gastrointestinal appliance and device: Secondary | ICD-10-CM | POA: Diagnosis not present

## 2023-07-11 DIAGNOSIS — Z952 Presence of prosthetic heart valve: Secondary | ICD-10-CM | POA: Diagnosis not present

## 2023-07-11 DIAGNOSIS — I4811 Longstanding persistent atrial fibrillation: Secondary | ICD-10-CM | POA: Diagnosis not present

## 2023-07-11 DIAGNOSIS — I517 Cardiomegaly: Secondary | ICD-10-CM | POA: Diagnosis not present

## 2023-07-11 DIAGNOSIS — I484 Atypical atrial flutter: Secondary | ICD-10-CM | POA: Diagnosis not present

## 2023-07-11 DIAGNOSIS — Z6829 Body mass index (BMI) 29.0-29.9, adult: Secondary | ICD-10-CM | POA: Diagnosis not present

## 2023-07-11 DIAGNOSIS — I5032 Chronic diastolic (congestive) heart failure: Secondary | ICD-10-CM | POA: Diagnosis not present

## 2023-07-11 DIAGNOSIS — I341 Nonrheumatic mitral (valve) prolapse: Secondary | ICD-10-CM | POA: Diagnosis not present

## 2023-07-11 DIAGNOSIS — R100 Acute abdomen: Secondary | ICD-10-CM | POA: Diagnosis not present

## 2023-07-11 DIAGNOSIS — I350 Nonrheumatic aortic (valve) stenosis: Secondary | ICD-10-CM | POA: Diagnosis not present

## 2023-07-11 DIAGNOSIS — Z87891 Personal history of nicotine dependence: Secondary | ICD-10-CM | POA: Diagnosis not present

## 2023-07-11 DIAGNOSIS — I2510 Atherosclerotic heart disease of native coronary artery without angina pectoris: Secondary | ICD-10-CM | POA: Diagnosis not present

## 2023-07-19 DIAGNOSIS — M25551 Pain in right hip: Secondary | ICD-10-CM | POA: Diagnosis not present

## 2023-07-19 DIAGNOSIS — M545 Low back pain, unspecified: Secondary | ICD-10-CM | POA: Diagnosis not present

## 2023-07-20 DIAGNOSIS — Z8719 Personal history of other diseases of the digestive system: Secondary | ICD-10-CM | POA: Diagnosis not present

## 2023-07-20 NOTE — Progress Notes (Signed)
[facility]   MULTIDISCIPLINARY HEART VALVE CLINIC                                     Cardiology Office Note:    Date:  07/21/2023   ID:  Patrick Franco, DOB 1932/11/08, MRN [id number]  PCP:  Kirby Funk, MD (Inactive)  [facility] Cardiologist:  Verne Carrow, MD  [facility] Electrophysiologist:  Hillis Range, MD (Inactive)   Referring MD: Kirby Funk, MD   89 year s/p TAVR  History of Present Illness:    Patrick Franco is a 89 y.o. male with a hx of CAD, HTN, mitral valve prolapse, chronic diastolic CHF, PAF on Eliquis, SBO, sleep apnea, spinal stenosis and severe aortic stenosis s/p TAVR (07/26/22) who presents to clinic for follow up.    He developed severe AS in 04/2022. [facility] 05/26/22 showed a heavily calcified LAD lesion with moderate proximal and mid disease. There appears to be a completely occluded branch just beyond this stenosis and then the LAD continues to the apex. Circumflex has a moderate mid stenosis. RCA is a large dominant vessel with heavy calcification throughout. Mild plaque in the proximal and mid vessel. Recommendations were to continue TAVR workup and treat his CAD medically. After his TAVR, if he continues to have dyspnea on exertion, can then consider complex PCI of the mid to distal LAD with orbital atherectomy and stenting.   S/p TAVR with a 26 mm Edwards Sapien 3 THV via the TF approach on 07/26/22. He was restarted on Eliquis. 1 month echo showed EF 65%, moderate asymmetric LVH, normally functioning TAVR with a mean gradient of 13 mm hg and mild PVL as well as MVP with mild-mod MR and severe MAC.  Recently admitted 8/27-8/30/24 in [place] for recurrent SBO. Taken off fish oil given afib and new studies showing increase risk for afib on higher doses of fish oil.   Today the patient presents to clinic for follow up. Here alone. Doing well. Mostly limited by spinal stenosis but still able to exercise on a bike and in the pool at  [employer]. No CP or SOB. No LE edema, orthopnea or PND.   Past Medical History:  Diagnosis Date   Aortic stenosis    Arthritis    Atrial fibrillation (HCC)    BPH (benign prostatic hyperplasia)    Chronic low back pain    Depression    Diverticulitis    Dyspnea    Elevated PSA    Foot drop, right    Gait disorder 02/03/2015   Hip pain    History of colonoscopy    History of nuclear stress test    Myoview 10/22: EF 67, no ischemia or infarction; low risk   HOH (hard of hearing)    hearing aid   Hypertension    Lumbar radiculopathy    Mitral valve prolapse    Onychomycosis    Peptic ulcer disease    S/P TAVR (transcatheter aortic valve replacement) 07/26/2022   26mm S3UR via TF approach with Dr. Clifton James and Dr. Charna Archer   Sepsis [facility]) 2005   e. coli-after prostate bx   Sleep apnea    Torn ACL    Tracheobronchomalacia    seen on 2019 Chest CT    Past Surgical History:  Procedure Laterality Date   BIOPSY  10/05/2021   Procedure: BIOPSY;  Surgeon: Kerin Salen, MD;  Location: [facility] ENDOSCOPY;  Service: Gastroenterology;;   CARDIAC CATHETERIZATION     CARDIOVERSION N/A 03/27/2020   Procedure: CARDIOVERSION;  Surgeon: Chilton Si, MD;  Location: [facility] ENDOSCOPY;  Service: Cardiovascular;  Laterality: N/A;   CATARACT EXTRACTION Bilateral    ESOPHAGOGASTRODUODENOSCOPY N/A 08/19/2015   Procedure: ESOPHAGOGASTRODUODENOSCOPY (EGD);  Surgeon: Carman Ching, MD;  Location: [facility] ENDOSCOPY;  Service: Endoscopy;  Laterality: N/A;   ESOPHAGOGASTRODUODENOSCOPY N/A 10/05/2021   Procedure: ESOPHAGOGASTRODUODENOSCOPY (EGD);  Surgeon: Kerin Salen, MD;  Location: [facility] ENDOSCOPY;  Service: Gastroenterology;  Laterality: N/A;   INTRAOPERATIVE TRANSTHORACIC ECHOCARDIOGRAM N/A 07/26/2022   Procedure: INTRAOPERATIVE TRANSTHORACIC ECHOCARDIOGRAM;  Surgeon: Kathleene Hazel, MD;  Location: [facility] OR;  Service: Open Heart Surgery;  Laterality: N/A;   right knee replacement Right 1996    RIGHT/LEFT HEART CATH AND CORONARY ANGIOGRAPHY N/A 05/26/2022   Procedure: RIGHT/LEFT HEART CATH AND CORONARY ANGIOGRAPHY;  Surgeon: Kathleene Hazel, MD;  Location: [facility] INVASIVE CV LAB;  Service: Cardiovascular;  Laterality: N/A;   SHOULDER SURGERY     TEE WITHOUT CARDIOVERSION N/A 03/27/2020   Procedure: TRANSESOPHAGEAL ECHOCARDIOGRAM (TEE);  Surgeon: Chilton Si, MD;  Location: [facility] ENDOSCOPY;  Service: Cardiovascular;  Laterality: N/A;   TONSILLECTOMY     TOTAL HIP ARTHROPLASTY Right 08/13/2019   Procedure: RIGHT TOTAL HIP ARTHROPLASTY ANTERIOR APPROACH;  Surgeon: Kathryne Hitch, MD;  Location: [facility] OR;  Service: Orthopedics;  Laterality: Right;   TRANSCATHETER AORTIC VALVE REPLACEMENT, TRANSFEMORAL N/A 07/26/2022   Procedure: Transcatheter Aortic Valve Replacement, Transfemoral using a 26 MM Edwards SAPIEN 3 Ultra;  Surgeon: Kathleene Hazel, MD;  Location: [facility] OR;  Service: Open Heart Surgery;  Laterality: N/A;  Transfemoral approach   UMBILICAL HERNIA REPAIR N/A 08/25/2022   Procedure: UMBILICAL HERNIA REPAIR WITH POSSIBLE MESH;  Surgeon: Chevis Pretty III, MD;  Location: [facility] ORS;  Service: General;  Laterality: N/A;    Current Medications: Current Meds  Medication Sig   acetaminophen (TYLENOL) 500 MG tablet Take 1,000 mg by mouth every 6 (six) hours as needed for moderate pain.   allopurinol (ZYLOPRIM) 100 MG tablet Take 2 tablets (200 mg total) by mouth daily.   amoxicillin (AMOXIL) 500 MG tablet Take 4 tablets (2,000 mg total) by mouth as directed. 1 hour prior to dental work including cleanings   atorvastatin (LIPITOR) 10 MG tablet Take 1 tablet (10 mg total) by mouth every morning.   cyanocobalamin (VITAMIN B12) 1000 MCG tablet Take 1,000 mcg by mouth daily.   diltiazem (CARDIZEM CD) 240 MG 24 hr capsule TAKE (1) CAPSULE DAILY.   dutasteride (AVODART) 0.5 MG capsule Take 0.5 mg by mouth every morning.   ELIQUIS 5 MG TABS tablet TAKE 1 TABLET TWICE A DAY    furosemide (LASIX) 40 MG tablet Take 1.5 tablets (60 mg total) by mouth 4 (four) times a week.   metoprolol tartrate (LOPRESSOR) 25 MG tablet Take 1 tablet (25 mg total) by mouth daily.   mirtazapine (REMERON) 30 MG tablet TAKE ONE TABLET AT BEDTIME.   Multiple Vitamin (MULTIVITAMIN) tablet Take 1 tablet by mouth daily.   Omega-3 Fatty Acids (FISH OIL) 1200 MG CAPS Take 1,200 mg by mouth daily.   Polyethyl Glycol-Propyl Glycol (SYSTANE) 0.4-0.3 % SOLN Place 1 drop into both eyes 2 (two) times daily.   potassium chloride SA (KLOR-CON M) 20 MEQ tablet TAKE 1 TABLET BY MOUTH IN THE MORNING, TAKE 1/2 TABLET BY MOUTH IN THE P.M.   Probiotic Product (PROBIOTIC DAILY PO) Take 1 tablet by mouth daily.   pyridOXINE (  VITAMIN B-6) 100 MG tablet Take 100 mg by mouth daily.   spironolactone (ALDACTONE) 25 MG tablet Take 1 tablet (25 mg total) by mouth daily.   tamsulosin (FLOMAX) 0.4 MG CAPS capsule Take 0.4 mg by mouth daily after breakfast.   Turmeric 500 MG CAPS Take 500 mg by mouth daily.   [DISCONTINUED] amiodarone (PACERONE) 200 MG tablet Take 1 tablet (200 mg total) by mouth daily.      ROS:   Please see the history of present illness.    All other systems reviewed and are negative.  EKGs   EKG:  EKG is NOT ordered today.    Recent Labs: 07/27/2022: Magnesium 1.7 03/07/2023: ALT 19; BUN 29; Creatinine, Ser 1.13; Hemoglobin 15.5; Platelets 188; Potassium 5.0; Sodium 142; TSH 3.150  Recent Lipid Panel    Component Value Date/Time   CHOL 121 03/07/2023 0739   TRIG 89 03/07/2023 0739   HDL 53 03/07/2023 0739   CHOLHDL 2.3 03/07/2023 0739   CHOLHDL 2.0 03/06/2020 0330   VLDL 10 03/06/2020 0330   LDLCALC 51 03/07/2023 0739     Risk Assessment/Calculations:    CHA2DS2-VASc Score = 5   This indicates a 7.2% annual risk of stroke. The patient's score is based upon: CHF History: 1 HTN History: 1 Diabetes History: 0 Stroke History: 0 Vascular Disease History: 1 Age Score: 2 Gender  Score: 0           Physical Exam:    VS:  BP 120/70   Pulse (!) 59   Ht 5\' 10"  (1.778 m)   Wt 206 lb 6.4 oz (93.6 kg)   SpO2 94%   BMI 29.62 kg/m     Wt Readings from Last 3 Encounters:  07/21/23 206 lb 6.4 oz (93.6 kg)  02/28/23 212 lb 6.4 oz (96.3 kg)  12/26/22 211 lb 13.8 oz (96.1 kg)     GEN: Well nourished, well developed in no acute distress NECK: No JVD; No carotid bruits CARDIAC: RRR, no murmurs, rubs, gallops RESPIRATORY:  Clear to auscultation without rales, wheezing or rhonchi  ABDOMEN: Soft, non-tender, non-distended EXTREMITIES:  No edema; No deformity   ASSESSMENT:    1. S/P TAVR (transcatheter aortic valve replacement)   2. Coronary artery disease involving native coronary artery of native heart without angina pectoris   3. PAF (paroxysmal atrial fibrillation) (HCC)   4. Primary hypertension   5. Chronic heart failure with preserved ejection fraction (HCC)   6. Mitral valve prolapse    PLAN:    In order of problems listed above:  Severe AS s/p TAVR: echo today shows EF 60%, severe LVH, normally functioning TAVR with a mean gradient of 14 mm hg and mild PVL. He has NYHA class I symptoms. He gets SBE prophylaxis from his dentist. Continue on Eliquis 5mg  BID. Continue regular follow up with Dr. Clifton James.   CAD: he underwent [facility] 05/26/22 which showed a heavily calcified LAD lesion with moderate proximal and mid disease.There appears to be a completely occluded branch just beyond this stenosis and then the LAD continues to the apex. Circumflex has a moderate mid stenosis. RCA is a large dominant vessel with heavy calcification throughout. Mild plaque in the proximal and mid vessel. Treating CAD medically. No ASA given Eliquis. Continue statin.    PAF: continue amiodarone 200mg  daily and Eliquis 5mg  BID. LFTs, TSH, CXR recently checked and normal. Will need ongoing amio surveillance.    HTN: BP well controlled. Continue Cardizem 240 mg daily, metoprolol  tartrate 25 mg daily, spironolactone 25 mg daily.   Chronic diastolic CHF: appears euvolemic. Only takes lasix ~2x a week.    Mitral valve prolapse: moderate MR on echo today. Continue to follow with surveillence echocardiograms.     Medication Adjustments/Labs and Tests Ordered: Current medicines are reviewed at length with the patient today.  Concerns regarding medicines are outlined above.  No orders of the defined types were placed in this encounter.  Meds ordered this encounter  Medications   amiodarone (PACERONE) 200 MG tablet    Sig: Take 1 tablet (200 mg total) by mouth daily.    Dispense:  90 tablet    Refill:  3    Patient Instructions  Medication Instructions:   Your physician recommends that you continue on your current medications as directed. Please refer to the Current Medication list given to you today.   *If you need a refill on your cardiac medications before your next appointment, please call your pharmacy*   Lab Work:  None ordered.  If you have labs (blood work) drawn today and your tests are completely normal, you will receive your results only by: MyChart Message (if you have MyChart) OR A paper copy in the mail If you have any lab test that is abnormal or we need to change your treatment, we will call you to review the results.   Testing/Procedures:  None ordered.   Follow-Up: At [facility], you and your health needs are our priority.  As part of our continuing mission to provide you with exceptional heart care, we have created designated Provider Care Teams.  These Care Teams include your primary Cardiologist (physician) and Advanced Practice Providers (APPs -  Physician Assistants and Nurse Practitioners) who all work together to provide you with the care you need, when you need it.  We recommend signing up for the patient portal called "MyChart".  Sign up information is provided on this After Visit Summary.  MyChart is used to  connect with patients for Virtual Visits (Telemedicine).  Patients are able to view lab/test results, encounter notes, upcoming appointments, etc.  Non-urgent messages can be sent to your provider as well.   To learn more about what you can do with MyChart, go to [email or web address].    Your next appointment:   5 month(s)  Provider:   Verne Carrow, MD       Signed, Cline Crock, PA-C  07/21/2023 11:17 AM    [facility]

## 2023-07-21 ENCOUNTER — Ambulatory Visit (INDEPENDENT_AMBULATORY_CARE_PROVIDER_SITE_OTHER): Payer: Medicare Other | Admitting: Physician Assistant

## 2023-07-21 ENCOUNTER — Ambulatory Visit: Payer: Medicare Other

## 2023-07-21 ENCOUNTER — Ambulatory Visit: Payer: Medicare Other | Attending: Internal Medicine

## 2023-07-21 VITALS — BP 120/70 | HR 59 | Ht 70.0 in | Wt 206.4 lb

## 2023-07-21 DIAGNOSIS — I10 Essential (primary) hypertension: Secondary | ICD-10-CM

## 2023-07-21 DIAGNOSIS — I2510 Atherosclerotic heart disease of native coronary artery without angina pectoris: Secondary | ICD-10-CM | POA: Insufficient documentation

## 2023-07-21 DIAGNOSIS — Z952 Presence of prosthetic heart valve: Secondary | ICD-10-CM | POA: Diagnosis not present

## 2023-07-21 DIAGNOSIS — I341 Nonrheumatic mitral (valve) prolapse: Secondary | ICD-10-CM | POA: Insufficient documentation

## 2023-07-21 DIAGNOSIS — I480 Paroxysmal atrial fibrillation: Secondary | ICD-10-CM | POA: Diagnosis not present

## 2023-07-21 DIAGNOSIS — I5032 Chronic diastolic (congestive) heart failure: Secondary | ICD-10-CM

## 2023-07-21 LAB — ECHOCARDIOGRAM COMPLETE
AR max vel: 1.73 cm2
AV Area VTI: 2.1 cm2
AV Area mean vel: 1.89 cm2
AV Mean grad: 14 mmHg
AV Peak grad: 30.5 mmHg
Ao pk vel: 2.76 m/s
Area-P 1/2: 2.91 cm2
S' Lateral: 2.4 cm

## 2023-07-21 MED ORDER — AMIODARONE HCL 200 MG PO TABS: 200.0000 mg | ORAL_TABLET | Freq: Every day | ORAL | 3 refills | Status: DC

## 2023-07-21 NOTE — Patient Instructions (Signed)
Medication Instructions:   Your physician recommends that you continue on your current medications as directed. Please refer to the Current Medication list given to you today.   *If you need a refill on your cardiac medications before your next appointment, please call your pharmacy*   Lab Work:  None ordered.  If you have labs (blood work) drawn today and your tests are completely normal, you will receive your results only by: MyChart Message (if you have MyChart) OR A paper copy in the mail If you have any lab test that is abnormal or we need to change your treatment, we will call you to review the results.   Testing/Procedures:  None ordered.   Follow-Up: At [facility], you and your health needs are our priority.  As part of our continuing mission to provide you with exceptional heart care, we have created designated Provider Care Teams.  These Care Teams include your primary Cardiologist (physician) and Advanced Practice Providers (APPs -  Physician Assistants and Nurse Practitioners) who all work together to provide you with the care you need, when you need it.  We recommend signing up for the patient portal called "MyChart".  Sign up information is provided on this After Visit Summary.  MyChart is used to connect with patients for Virtual Visits (Telemedicine).  Patients are able to view lab/test results, encounter notes, upcoming appointments, etc.  Non-urgent messages can be sent to your provider as well.   To learn more about what you can do with MyChart, go to [email or web address].    Your next appointment:   5 month(s)  Provider:   Verne Carrow, MD

## 2023-07-23 ENCOUNTER — Emergency Department (HOSPITAL_COMMUNITY): Payer: Medicare Other

## 2023-07-23 ENCOUNTER — Other Ambulatory Visit: Payer: Self-pay

## 2023-07-23 ENCOUNTER — Emergency Department (HOSPITAL_COMMUNITY)
Admission: EM | Admit: 2023-07-23 | Discharge: 2023-07-23 | Disposition: A | Discharge status: Home / Self Care | Payer: Medicare Other | Attending: Emergency Medicine | Admitting: Emergency Medicine

## 2023-07-23 ENCOUNTER — Encounter (HOSPITAL_COMMUNITY): Payer: Self-pay

## 2023-07-23 DIAGNOSIS — R1032 Left lower quadrant pain: Secondary | ICD-10-CM | POA: Diagnosis present

## 2023-07-23 DIAGNOSIS — Z7901 Long term (current) use of anticoagulants: Secondary | ICD-10-CM | POA: Diagnosis not present

## 2023-07-23 DIAGNOSIS — K5732 Diverticulitis of large intestine without perforation or abscess without bleeding: Secondary | ICD-10-CM | POA: Diagnosis not present

## 2023-07-23 DIAGNOSIS — N200 Calculus of kidney: Secondary | ICD-10-CM | POA: Diagnosis not present

## 2023-07-23 DIAGNOSIS — K56609 Unspecified intestinal obstruction, unspecified as to partial versus complete obstruction: Secondary | ICD-10-CM | POA: Diagnosis not present

## 2023-07-23 DIAGNOSIS — N281 Cyst of kidney, acquired: Secondary | ICD-10-CM | POA: Diagnosis not present

## 2023-07-23 LAB — LIPASE, BLOOD: Lipase: 25 U/L (ref 11–51)

## 2023-07-23 LAB — CBC WITH DIFFERENTIAL/PLATELET
Abs Immature Granulocytes: 0.06 10*3/uL (ref 0.00–0.07)
Basophils Absolute: 0 10*3/uL (ref 0.0–0.1)
Basophils Relative: 0 %
Eosinophils Absolute: 0 10*3/uL (ref 0.0–0.5)
Eosinophils Relative: 0 %
HCT: 43.7 % (ref 39.0–52.0)
Hemoglobin: 14.8 g/dL (ref 13.0–17.0)
Immature Granulocytes: 1 %
Lymphocytes Relative: 8 %
Lymphs Abs: 1 10*3/uL (ref 0.7–4.0)
MCH: 34.7 pg — ABNORMAL HIGH (ref 26.0–34.0)
MCHC: 33.9 g/dL (ref 30.0–36.0)
MCV: 102.3 fL — ABNORMAL HIGH (ref 80.0–100.0)
Monocytes Absolute: 0.7 10*3/uL (ref 0.1–1.0)
Monocytes Relative: 6 %
Neutro Abs: 10 10*3/uL — ABNORMAL HIGH (ref 1.7–7.7)
Neutrophils Relative %: 85 %
Platelets: 266 10*3/uL (ref 150–400)
RBC: 4.27 MIL/uL (ref 4.22–5.81)
RDW: 12.9 % (ref 11.5–15.5)
WBC: 11.8 10*3/uL — ABNORMAL HIGH (ref 4.0–10.5)
nRBC: 0 % (ref 0.0–0.2)

## 2023-07-23 LAB — URINALYSIS, W/ REFLEX TO CULTURE (INFECTION SUSPECTED)
Bacteria, UA: NONE SEEN
Bilirubin Urine: NEGATIVE
Glucose, UA: NEGATIVE mg/dL
Ketones, ur: NEGATIVE mg/dL
Leukocytes,Ua: NEGATIVE
Nitrite: NEGATIVE
Protein, ur: NEGATIVE mg/dL
Specific Gravity, Urine: 1.03 (ref 1.005–1.030)
pH: 7 (ref 5.0–8.0)

## 2023-07-23 LAB — COMPREHENSIVE METABOLIC PANEL
ALT: 22 U/L (ref 0–44)
AST: 16 U/L (ref 15–41)
Albumin: 3.8 g/dL (ref 3.5–5.0)
Alkaline Phosphatase: 71 U/L (ref 38–126)
Anion gap: 11 (ref 5–15)
BUN: 19 mg/dL (ref 8–23)
CO2: 27 mmol/L (ref 22–32)
Calcium: 9.1 mg/dL (ref 8.9–10.3)
Chloride: 97 mmol/L — ABNORMAL LOW (ref 98–111)
Creatinine, Ser: 1.12 mg/dL (ref 0.61–1.24)
GFR, Estimated: >60 mL/min (ref >60)
Glucose, Bld: 130 mg/dL — ABNORMAL HIGH (ref 70–99)
Potassium: 4.6 mmol/L (ref 3.5–5.1)
Sodium: 135 mmol/L (ref 135–145)
Total Bilirubin: 1.2 mg/dL (ref 0.3–1.2)
Total Protein: 6.8 g/dL (ref 6.5–8.1)

## 2023-07-23 MED ORDER — CIPROFLOXACIN HCL 500 MG PO TABS: 500.0000 mg | ORAL_TABLET | Freq: Two times a day (BID) | ORAL | 0 refills | Status: DC

## 2023-07-23 MED ORDER — ONDANSETRON HCL 4 MG PO TABS: 4.0000 mg | ORAL_TABLET | Freq: Four times a day (QID) | ORAL | 0 refills | Status: DC

## 2023-07-23 MED ORDER — CIPROFLOXACIN HCL 500 MG PO TABS
500.0000 mg | ORAL_TABLET | Freq: Once | ORAL | Status: AC
  Administered 2023-07-23: 500 mg via ORAL
  Filled 2023-07-23: qty 1

## 2023-07-23 MED ORDER — METRONIDAZOLE 500 MG PO TABS: 500.0000 mg | ORAL_TABLET | Freq: Two times a day (BID) | ORAL | 0 refills | Status: DC

## 2023-07-23 MED ORDER — METRONIDAZOLE 500 MG PO TABS
500.0000 mg | ORAL_TABLET | Freq: Once | ORAL | Status: AC
  Administered 2023-07-23: 500 mg via ORAL
  Filled 2023-07-23: qty 1

## 2023-07-23 MED ORDER — IOHEXOL 300 MG/ML  SOLN
100.0000 mL | Freq: Once | INTRAMUSCULAR | Status: AC | PRN
  Administered 2023-07-23: 100 mL via INTRAVENOUS

## 2023-07-23 NOTE — ED Provider Notes (Signed)
Patrick Franco EMERGENCY DEPARTMENT AT [facility] Provider Note   CSN: [id number] Arrival date & time: 07/23/23  0741     History  Chief Complaint  Patient presents with   Abdominal Pain    Patrick Franco is a 89 y.o. male.  89 year old male with prior medical history as detailed below presents for evaluation.  Patient complains of left lower quadrant abdominal pain that started last night.  He denies associated nausea, vomiting, diarrhea.  He does report a recent history of small bowel obstruction requiring hospitalization.  No fevers reported.  Patient declines pain medication for his symptoms while in the ED.  The history is provided by the patient and medical records.       Home Medications Prior to Admission medications   Medication Sig Start Date End Date Taking? Authorizing Provider  acetaminophen (TYLENOL) 500 MG tablet Take 1,000 mg by mouth every 6 (six) hours as needed for moderate pain.    [provider]  allopurinol (ZYLOPRIM) 100 MG tablet Take 2 tablets (200 mg total) by mouth daily. 12/29/21   Newman Nip, NP  amiodarone (PACERONE) 200 MG tablet Take 1 tablet (200 mg total) by mouth daily. 07/21/23   Janetta Hora, PA-C  amoxicillin (AMOXIL) 500 MG tablet Take 4 tablets (2,000 mg total) by mouth as directed. 1 hour prior to dental work including cleanings 08/05/22   Janetta Hora, PA-C  atorvastatin (LIPITOR) 10 MG tablet Take 1 tablet (10 mg total) by mouth every morning. 02/28/23   Tereso Newcomer T, PA-C  cyanocobalamin (VITAMIN B12) 1000 MCG tablet Take 1,000 mcg by mouth daily.    [provider]  diltiazem (CARDIZEM CD) 240 MG 24 hr capsule TAKE (1) CAPSULE DAILY. 02/28/23   Tereso Newcomer T, PA-C  dutasteride (AVODART) 0.5 MG capsule Take 0.5 mg by mouth every morning. 01/06/15   [provider]  ELIQUIS 5 MG TABS tablet TAKE 1 TABLET TWICE A DAY 06/01/23   Kathleene Hazel, MD  furosemide (LASIX) 40 MG tablet  Take 1.5 tablets (60 mg total) by mouth 4 (four) times a week. 02/28/23   Tereso Newcomer T, PA-C  metoprolol tartrate (LOPRESSOR) 25 MG tablet Take 1 tablet (25 mg total) by mouth daily. 02/28/23   Tereso Newcomer T, PA-C  mirtazapine (REMERON) 30 MG tablet TAKE ONE TABLET AT BEDTIME. 08/03/20   Judi Saa, DO  Multiple Vitamin (MULTIVITAMIN) tablet Take 1 tablet by mouth daily.    [provider]  Omega-3 Fatty Acids (FISH OIL) 1200 MG CAPS Take 1,200 mg by mouth daily.    [provider]  pantoprazole (PROTONIX) 40 MG tablet Take 1 tablet (40 mg total) by mouth daily. 12/29/21 02/28/23  Newman Nip, NP  Polyethyl Glycol-Propyl Glycol (SYSTANE) 0.4-0.3 % SOLN Place 1 drop into both eyes 2 (two) times daily.    [provider]  potassium chloride SA (KLOR-CON M) 20 MEQ tablet TAKE 1 TABLET BY MOUTH IN THE MORNING, TAKE 1/2 TABLET BY MOUTH IN THE P.M. 02/28/23   Tereso Newcomer T, PA-C  Probiotic Product (PROBIOTIC DAILY PO) Take 1 tablet by mouth daily.    [provider]  pyridOXINE (VITAMIN B-6) 100 MG tablet Take 100 mg by mouth daily.    [provider]  spironolactone (ALDACTONE) 25 MG tablet Take 1 tablet (25 mg total) by mouth daily. 02/28/23   Tereso Newcomer T, PA-C  tamsulosin (FLOMAX) 0.4 MG CAPS capsule Take 0.4 mg by mouth  daily after breakfast. 01/06/15   [provider]  Turmeric 500 MG CAPS Take 500 mg by mouth daily.    [provider]      Allergies    Patient has no known allergies.    Review of Systems   Review of Systems  All other systems reviewed and are negative.   Physical Exam Updated Vital Signs BP 130/69 (BP Location: Right Arm)   Pulse 70   Temp 97.7 F (36.5 C) (Oral)   Resp 18   Ht 5\' 10"  (1.778 m)   Wt 93.6 kg   SpO2 94%   BMI 29.61 kg/m  Physical Exam Vitals and nursing note reviewed.  Constitutional:      General: He is not in acute distress.    Appearance: Normal appearance. He is  well-developed.  HENT:     Head: Normocephalic and atraumatic.  Eyes:     Conjunctiva/sclera: Conjunctivae normal.     Pupils: Pupils are equal, round, and reactive to light.  Cardiovascular:     Rate and Rhythm: Normal rate and regular rhythm.     Heart sounds: Normal heart sounds.  Pulmonary:     Effort: Pulmonary effort is normal. No respiratory distress.     Breath sounds: Normal breath sounds.  Abdominal:     General: There is no distension.     Palpations: Abdomen is soft.     Tenderness: There is abdominal tenderness in the left lower quadrant.  Musculoskeletal:        General: No deformity. Normal range of motion.     Cervical back: Normal range of motion and neck supple.  Skin:    General: Skin is warm and dry.  Neurological:     General: No focal deficit present.     Mental Status: He is alert and oriented to person, place, and time.     ED Results / Procedures / Treatments   Labs (all labs ordered are listed, but only abnormal results are displayed) Labs Reviewed  CBC WITH DIFFERENTIAL/PLATELET - Abnormal; Notable for the following components:      Result Value   WBC 11.8 (*)    MCV 102.3 (*)    MCH 34.7 (*)    Neutro Abs 10.0 (*)    All other components within normal limits  URINALYSIS, W/ REFLEX TO CULTURE (INFECTION SUSPECTED)  COMPREHENSIVE METABOLIC PANEL  LIPASE, BLOOD    EKG None  Radiology ECHOCARDIOGRAM COMPLETE  Result Date: 07/21/2023    ECHOCARDIOGRAM REPORT   Patient Name:   Patrick Franco   Date of Exam: 07/21/2023 Medical Rec #:  [id number]     Height:       69.5 in Accession #:    [id number]    Weight:       212.4 lb Date of Birth:  1932-10-17     BSA:          2.130 m Patient Age:    89 years      BP:           126/70 mmHg Patient Gender: M             HR:           67 bpm. Exam Location:  [address] Procedure: 2D Echo, Cardiac Doppler and Color Doppler Indications:     Z95.2 S/p TAVR  History:         Patient has prior history of  Echocardiogram examinations, most  recent 09/28/2022. S/p TAVR (26mm Edwards Sapien Ultra                  stented), Arrythmias:Paroxysmal atrial fibrillation; Risk                  Factors:Hypertension, Dyslipidemia and Former Smoker.                  Aortic Valve: 26 mm Ultra, stented (TAVR) valve is present in                  the aortic position.  Sonographer:     Samule Ohm RDCS Referring Phys:  [id number] Wille Celeste THOMPSON Diagnosing Phys: Armanda Magic MD IMPRESSIONS  1. Left ventricular ejection fraction, by estimation, is 60 to 65%. The left ventricle has normal function. The left ventricle has no regional wall motion abnormalities. There is severe concentric left ventricular hypertrophy. Left ventricular diastolic  parameters are consistent with Grade II diastolic dysfunction (pseudonormalization). Elevated left ventricular end-diastolic pressure.  2. Right ventricular systolic function is normal. The right ventricular size is normal. There is normal pulmonary artery systolic pressure. The estimated right ventricular systolic pressure is 35.7 mmHg.  3. Left atrial size was severely dilated.  4. The mitral valve is degenerative. Moderate mitral valve regurgitation. No evidence of mitral stenosis. There is mild prolapse of both leaflets of the mitral valve. Moderate mitral annular calcification.  5. The aortic valve has been repaired/replaced. Mild perivalvular Aortic valve regurgitation. No aortic stenosis is present. There is a 26 mm Ultra, stented (TAVR) valve present in the aortic position. Aortic valve area, by VTI measures 2.10 cm. Aortic  valve mean gradient measures 14.0 mmHg. Aortic valve Vmax measures 2.76 m/s. DVI 0.51.  6. The inferior vena cava is normal in size with greater than 50% respiratory variability, suggesting right atrial pressure of 3 mmHg  7. There is right bowing of the interatrial septum, suggestive of elevated left atrial pressure. No atrial level shunt detected  by color flow Doppler.  8. Compared to study dated 09/28/2022, the mean TAVR gradient has increased from to , DVI has decreased from 0.6 to 0.51, perivalvular AI remains mild and MR is now moderate.  9. Cannot exclude a small PFO. FINDINGS  Left Ventricle: Left ventricular ejection fraction, by estimation, is 60 to 65%. The left ventricle has normal function. The left ventricle has no regional wall motion abnormalities. The left ventricular internal cavity size was normal in size. There is  severe concentric left ventricular hypertrophy. Left ventricular diastolic parameters are consistent with Grade II diastolic dysfunction (pseudonormalization). Elevated left ventricular end-diastolic pressure. Right Ventricle: The right ventricular size is normal. No increase in right ventricular wall thickness. Right ventricular systolic function is normal. There is normal pulmonary artery systolic pressure. The tricuspid regurgitant velocity is 2.86 m/s, and  with an assumed right atrial pressure of 3 mmHg, the estimated right ventricular systolic pressure is 35.7 mmHg. Left Atrium: Left atrial size was severely dilated. Right Atrium: Right atrial size was normal in size. Pericardium: Trivial pericardial effusion is present. The pericardial effusion is circumferential. Mitral Valve: The mitral valve is degenerative in appearance. There is mild prolapse of both leaflets of the mitral valve. There is mild thickening of the mitral valve leaflet(s). Moderate mitral annular calcification. Moderate mitral valve regurgitation. No evidence of mitral valve stenosis. Tricuspid Valve: The tricuspid valve is normal in structure. Tricuspid valve regurgitation is trivial. No evidence of tricuspid stenosis. Aortic Valve: The  aortic valve has been repaired/replaced. Aortic valve regurgitation is mild. No aortic stenosis is present. Aortic valve mean gradient measures 14.0 mmHg. Aortic valve peak gradient measures 30.5 mmHg. Aortic  valve area, by VTI measures  2.10 cm. There is a 26 mm Ultra, stented (TAVR) valve present in the aortic position. Pulmonic Valve: The pulmonic valve was normal in structure. Pulmonic valve regurgitation is trivial. No evidence of pulmonic stenosis. Aorta: The aortic root is normal in size and structure. Venous: The inferior vena cava is normal in size with greater than 50% respiratory variability, suggesting right atrial pressure of 3 mmHg. IAS/Shunts: There is right bowing of the interatrial septum, suggestive of elevated left atrial pressure. Cannot exclude a small PFO.  LEFT VENTRICLE PLAX 2D LVIDd:         4.40 cm   Diastology LVIDs:         2.40 cm   LV e' medial:    5.11 cm/s LV PW:         1.80 cm   LV E/e' medial:  35.4 LV IVS:        1.90 cm   LV e' lateral:   6.09 cm/s LVOT diam:     2.30 cm   LV E/e' lateral: 29.7 LV SV:         98 LV SV Index:   46 LVOT Area:     4.15 cm  RIGHT VENTRICLE            IVC TAPSE (M-mode): 1.6 cm     IVC diam: 1.10 cm RVSP:           35.7 mmHg LEFT ATRIUM              Index        RIGHT ATRIUM           Index LA diam:        5.00 cm  2.35 cm/m   RA Pressure: 3.00 mmHg LA Vol (A2C):   104.0 ml 48.83 ml/m  RA Area:     15.60 cm LA Vol (A4C):   92.5 ml  43.43 ml/m  RA Volume:   36.90 ml  17.32 ml/m LA Biplane Vol: 98.4 ml  46.20 ml/m  AORTIC VALVE AV Area (Vmax):    1.73 cm AV Area (Vmean):   1.89 cm AV Area (VTI):     2.10 cm AV Vmax:           276.00 cm/s AV Vmean:          168.000 cm/s AV VTI:            0.466 m AV Peak Grad:      30.5 mmHg AV Mean Grad:      14.0 mmHg LVOT Vmax:         115.00 cm/s LVOT Vmean:        76.300 cm/s LVOT VTI:          0.236 m LVOT/AV VTI ratio: 0.51  AORTA Ao Root diam: 3.60 cm Ao Asc diam:  3.60 cm MITRAL VALVE                TRICUSPID VALVE MV Area (PHT): 2.91 cm     TR Peak grad:   32.7 mmHg MV Decel Time: 261 msec     TR Vmax:        286.00 cm/s MV E velocity: 181.00 cm/s  Estimated RAP:  3.00 mmHg MV A velocity: 150.00 cm/s   RVSP:  35.7 mmHg MV E/A ratio:  1.21                             SHUNTS                             Systemic VTI:  0.24 m                             Systemic Diam: 2.30 cm Armanda Magic MD Electronically signed by Armanda Magic MD Signature Date/Time: 07/21/2023/9:16:23 AM    Final (Updated)     Procedures Procedures    Medications Ordered in ED Medications - No data to display  ED Course/ Medical Decision Making/ A&P                                 Medical Decision Making Amount and/or Complexity of Data Reviewed Labs: ordered. Radiology: ordered.  Risk Prescription drug management.    Medical Screen Complete  This patient presented to the ED with complaint of abdominal pain.  This complaint involves an extensive number of treatment options. The initial differential diagnosis includes, but is not limited to, diverticulitis, bowel obstruction, metabolic abnormality, other intra-abdominal pathology  This presentation is: Acute, Self-Limited, Previously Undiagnosed, Uncertain Prognosis, Complicated, Systemic Symptoms, and Threat to Life/Bodily Function  Patient is presenting with left lower quadrant abdominal pain present for less than 24 hours.  Imaging and workup is consistent with acute sigmoid diverticulitis.  Patient is tolerating his pain well.  He was without nausea or vomiting.  Patient offered but declines admission.  He would prefer to go home.  Patient given prescriptions for oral antibiotics.  Importance of close follow-up is stressed.  Strict return precautions given and understood.    Additional history obtained: External records from outside sources obtained and reviewed including prior ED visits and prior Inpatient records.    Lab Tests:  I ordered and personally interpreted labs.  The pertinent results include: BC, CMP, lipase, UA   Imaging Studies ordered:  I ordered imaging studies including CT abdomen pelvis I independently visualized and  interpreted obtained imaging which showed sigmoid diverticulitis I agree with the radiologist interpretation.   Cardiac Monitoring:  The patient was maintained on a cardiac monitor.  I personally viewed and interpreted the cardiac monitor which showed an underlying rhythm of: NSR   Medicines ordered:  I ordered medication including antibiotics for diverticulitis Reevaluation of the patient after these medicines showed that the patient: improved    Problem List / ED Course:  Sigmoid diverticulitis   Reevaluation:  After the interventions noted above, I reevaluated the patient and found that they have: improved  Disposition:  After consideration of the diagnostic results and the patients response to treatment, I feel that the patent would benefit from close outpatient follow-up.          Final Clinical Impression(s) / ED Diagnoses Final diagnoses:  Sigmoid diverticulitis    Rx / DC Orders ED Discharge Orders          Ordered    ondansetron (ZOFRAN) 4 MG tablet  Every 6 hours,   Status:  Discontinued        07/23/23 1032    ciprofloxacin (CIPRO) 500 MG tablet  Every 12 hours,   Status:  Discontinued  07/23/23 1047    metroNIDAZOLE (FLAGYL) 500 MG tablet  2 times daily,   Status:  Discontinued        07/23/23 1047    ciprofloxacin (CIPRO) 500 MG tablet  Every 12 hours        07/23/23 1104    metroNIDAZOLE (FLAGYL) 500 MG tablet  2 times daily        07/23/23 1104    ondansetron (ZOFRAN) 4 MG tablet  Every 6 hours        07/23/23 1104              Wynetta Fines, MD 07/23/23 1606

## 2023-07-23 NOTE — Discharge Instructions (Signed)
Return for any problem.  ?

## 2023-07-23 NOTE — ED Triage Notes (Signed)
Patient arrive POV. Reports LLQ abdominal pain since lastnight. Denies N/V/D. Recent bowel obstruction. No other complaints

## 2023-07-24 DIAGNOSIS — M25551 Pain in right hip: Secondary | ICD-10-CM | POA: Diagnosis not present

## 2023-07-24 DIAGNOSIS — M545 Low back pain, unspecified: Secondary | ICD-10-CM | POA: Diagnosis not present

## 2023-07-31 DIAGNOSIS — M545 Low back pain, unspecified: Secondary | ICD-10-CM | POA: Diagnosis not present

## 2023-07-31 DIAGNOSIS — M25551 Pain in right hip: Secondary | ICD-10-CM | POA: Diagnosis not present

## 2023-08-02 DIAGNOSIS — M25551 Pain in right hip: Secondary | ICD-10-CM | POA: Diagnosis not present

## 2023-08-02 DIAGNOSIS — M545 Low back pain, unspecified: Secondary | ICD-10-CM | POA: Diagnosis not present

## 2023-08-07 DIAGNOSIS — R319 Hematuria, unspecified: Secondary | ICD-10-CM | POA: Diagnosis not present

## 2023-08-09 DIAGNOSIS — N390 Urinary tract infection, site not specified: Secondary | ICD-10-CM | POA: Diagnosis not present

## 2023-08-14 DIAGNOSIS — M25551 Pain in right hip: Secondary | ICD-10-CM | POA: Diagnosis not present

## 2023-08-14 DIAGNOSIS — M545 Low back pain, unspecified: Secondary | ICD-10-CM | POA: Diagnosis not present

## 2023-08-16 DIAGNOSIS — M545 Low back pain, unspecified: Secondary | ICD-10-CM | POA: Diagnosis not present

## 2023-08-16 DIAGNOSIS — M25551 Pain in right hip: Secondary | ICD-10-CM | POA: Diagnosis not present

## 2023-08-17 DIAGNOSIS — Z23 Encounter for immunization: Secondary | ICD-10-CM | POA: Diagnosis not present

## 2023-08-22 DIAGNOSIS — N3001 Acute cystitis with hematuria: Secondary | ICD-10-CM | POA: Diagnosis not present

## 2023-08-23 DIAGNOSIS — N3000 Acute cystitis without hematuria: Secondary | ICD-10-CM | POA: Diagnosis not present

## 2023-08-30 DIAGNOSIS — M545 Low back pain, unspecified: Secondary | ICD-10-CM | POA: Diagnosis not present

## 2023-08-30 DIAGNOSIS — M25551 Pain in right hip: Secondary | ICD-10-CM | POA: Diagnosis not present

## 2023-08-31 DIAGNOSIS — R935 Abnormal findings on diagnostic imaging of other abdominal regions, including retroperitoneum: Secondary | ICD-10-CM | POA: Diagnosis not present

## 2023-09-04 DIAGNOSIS — M25551 Pain in right hip: Secondary | ICD-10-CM | POA: Diagnosis not present

## 2023-09-04 DIAGNOSIS — M545 Low back pain, unspecified: Secondary | ICD-10-CM | POA: Diagnosis not present

## 2023-09-06 DIAGNOSIS — M545 Low back pain, unspecified: Secondary | ICD-10-CM | POA: Diagnosis not present

## 2023-09-06 DIAGNOSIS — M25551 Pain in right hip: Secondary | ICD-10-CM | POA: Diagnosis not present

## 2023-09-11 DIAGNOSIS — M25551 Pain in right hip: Secondary | ICD-10-CM | POA: Diagnosis not present

## 2023-09-11 DIAGNOSIS — M545 Low back pain, unspecified: Secondary | ICD-10-CM | POA: Diagnosis not present

## 2023-09-13 DIAGNOSIS — M25551 Pain in right hip: Secondary | ICD-10-CM | POA: Diagnosis not present

## 2023-09-13 DIAGNOSIS — M545 Low back pain, unspecified: Secondary | ICD-10-CM | POA: Diagnosis not present

## 2023-09-18 DIAGNOSIS — M25551 Pain in right hip: Secondary | ICD-10-CM | POA: Diagnosis not present

## 2023-09-18 DIAGNOSIS — M545 Low back pain, unspecified: Secondary | ICD-10-CM | POA: Diagnosis not present

## 2023-09-21 DIAGNOSIS — R351 Nocturia: Secondary | ICD-10-CM | POA: Diagnosis not present

## 2023-09-21 DIAGNOSIS — R310 Gross hematuria: Secondary | ICD-10-CM | POA: Diagnosis not present

## 2023-09-21 DIAGNOSIS — N401 Enlarged prostate with lower urinary tract symptoms: Secondary | ICD-10-CM | POA: Diagnosis not present

## 2023-10-05 DIAGNOSIS — R310 Gross hematuria: Secondary | ICD-10-CM | POA: Diagnosis not present

## 2023-10-15 IMAGING — US US ABDOMEN LIMITED
1 series · 14 of 15 positions shown · non-contrast
Comparison: Chest CT August 11, 2020

CLINICAL DATA: Evaluate for ascites.

EXAM:
LIMITED ABDOMEN ULTRASOUND FOR ASCITES
TECHNIQUE: Limited ultrasound survey for ascites was performed in all four
abdominal quadrants.

[Series 1: us abdomen limited · 0.23mm/px · 14 of 15 slices shown]
[im 1/15]
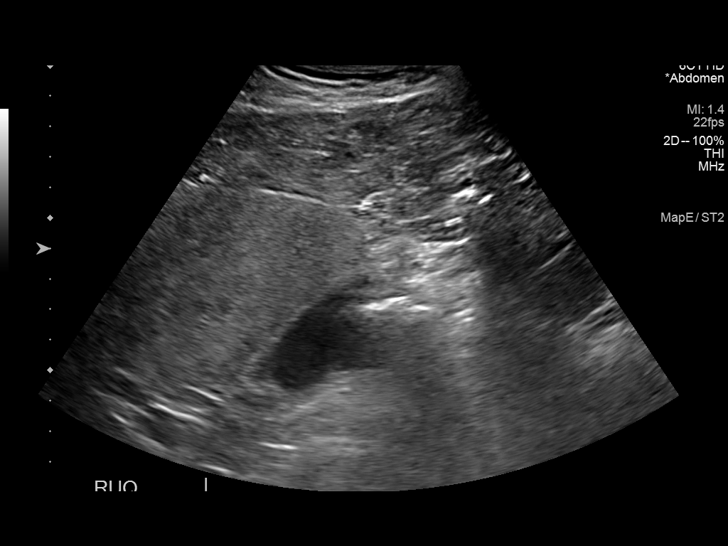
[im 2/15]
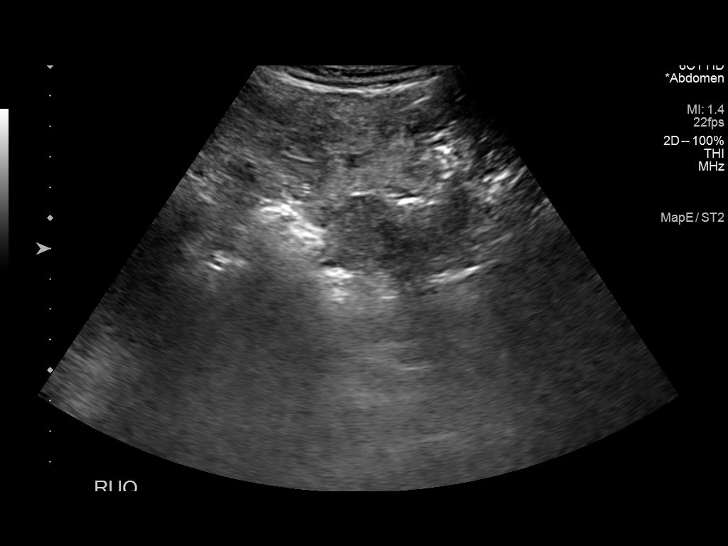
[im 3/15]
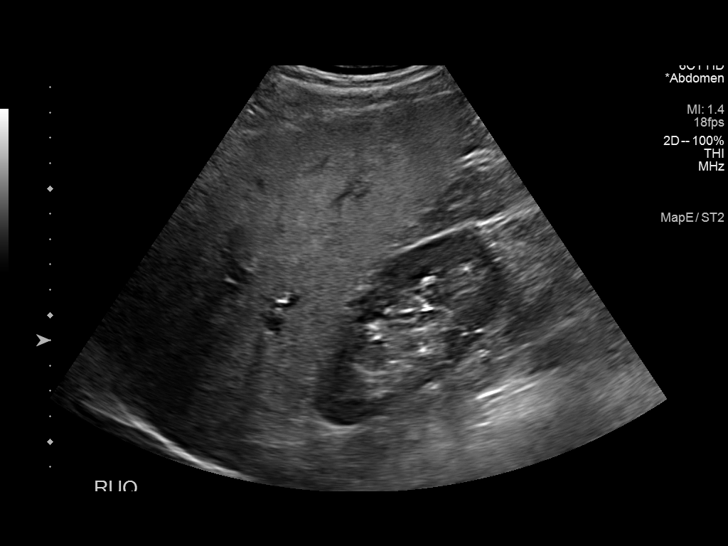
[im 4/15]
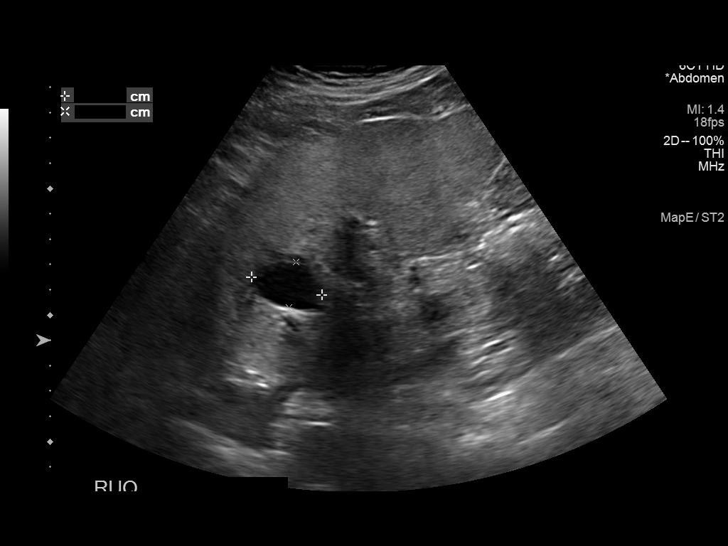
[im 5/15]
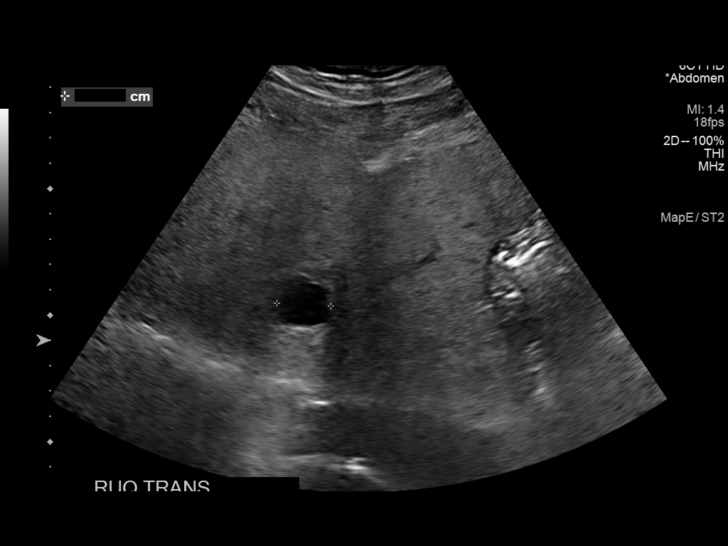
[im 6/15]
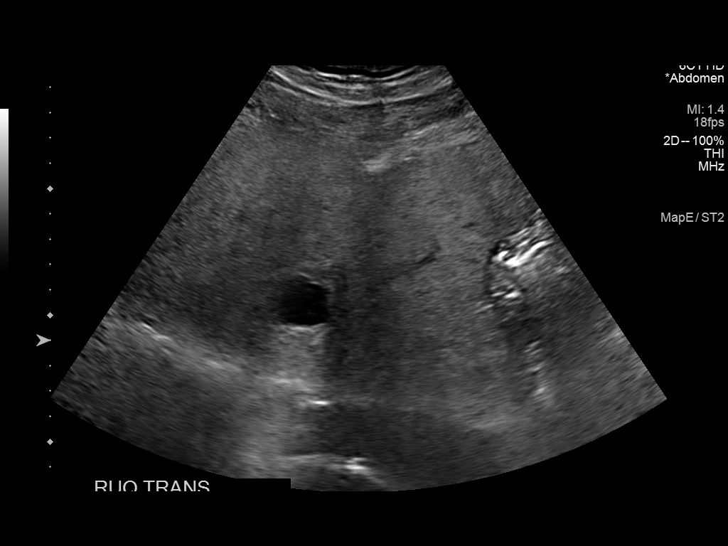
[im 7/15]
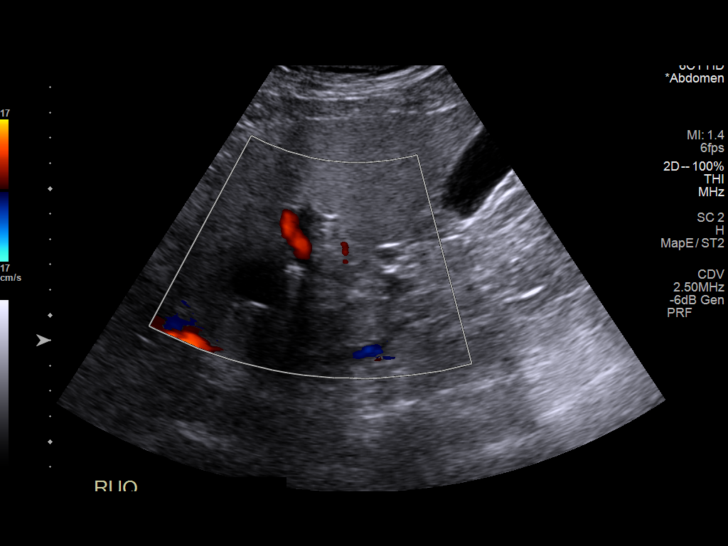
[im 9/15]
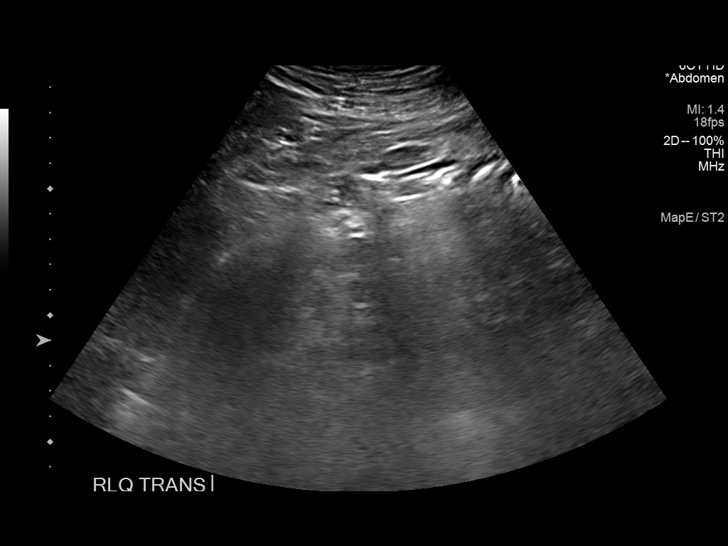
[im 10/15]
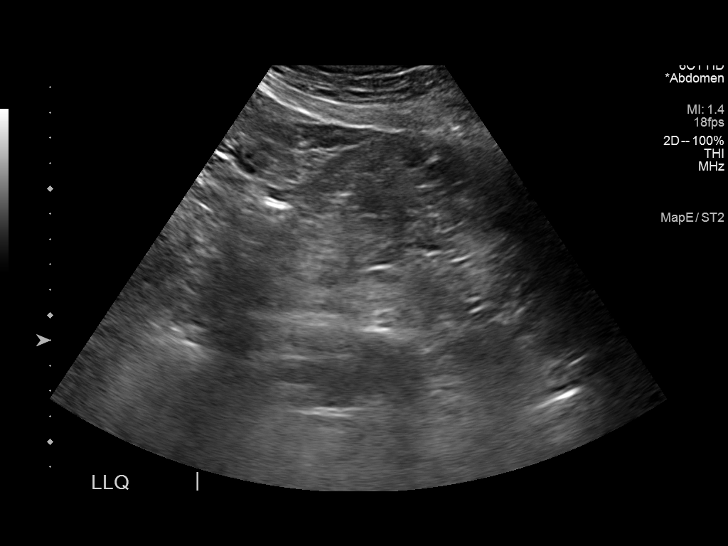
[im 11/15]
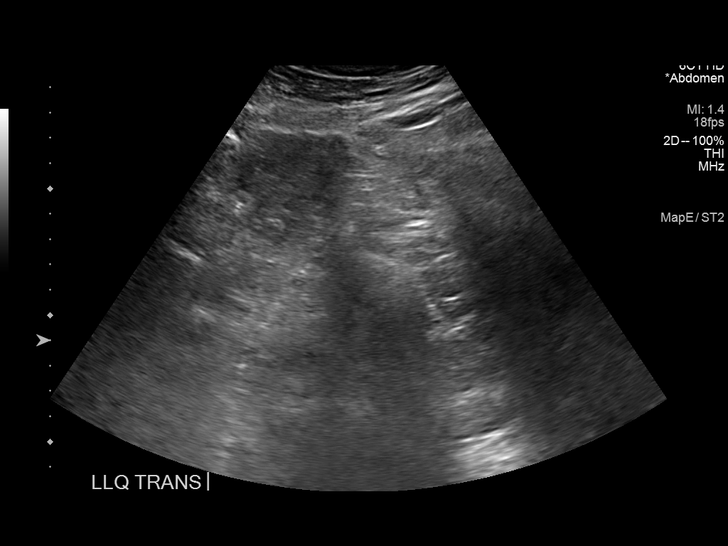
[im 12/15]
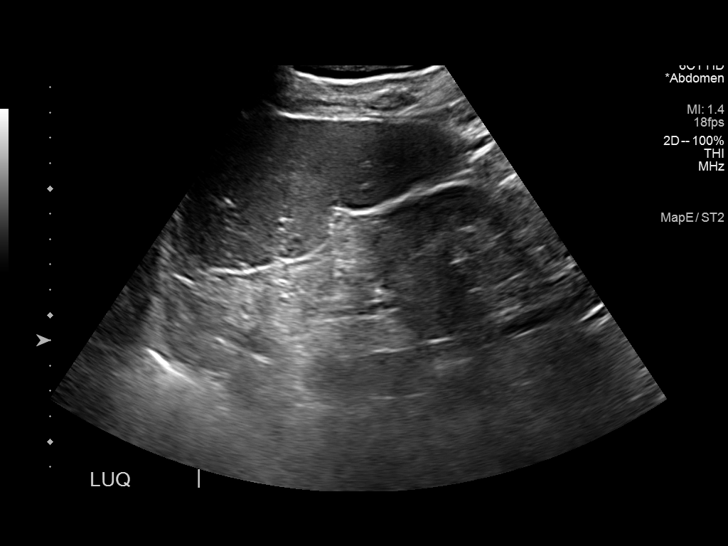
[im 13/15]
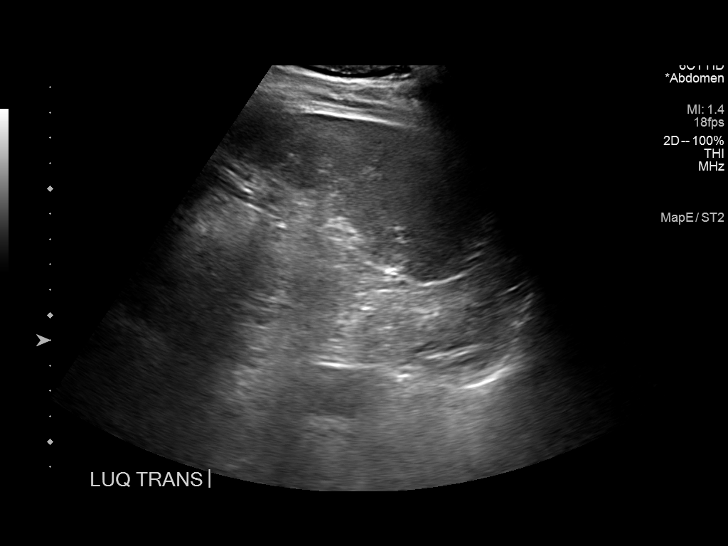
[im 14/15]
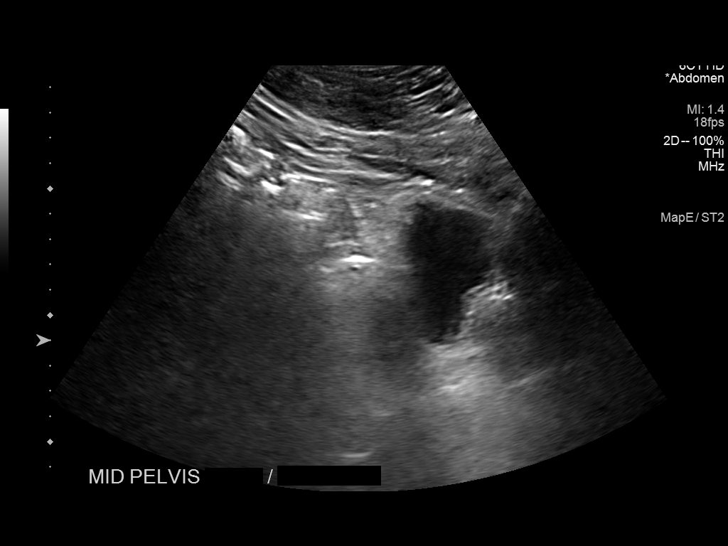
[im 15/15]
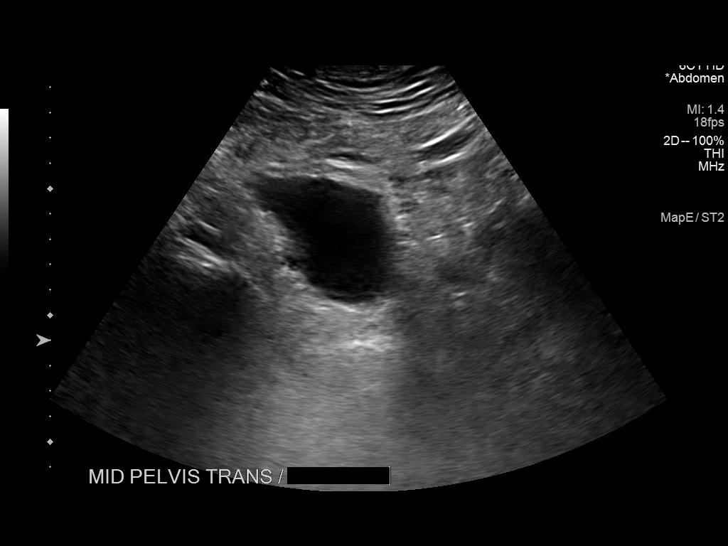

[14 of 15 positions shown; findings below may reference images not displayed]

FINDINGS: No ascites is visualized. Incidental cyst in the liver measuring
x 1.8 x 2.1 cm. Increased hepatic parenchymal echogenicity.
IMPRESSION: No ascites.

Increased hepatic parenchymal echogenicity suggestive of steatosis.

## 2023-10-19 DIAGNOSIS — N200 Calculus of kidney: Secondary | ICD-10-CM | POA: Diagnosis not present

## 2023-10-19 DIAGNOSIS — K7689 Other specified diseases of liver: Secondary | ICD-10-CM | POA: Diagnosis not present

## 2023-10-19 DIAGNOSIS — K769 Liver disease, unspecified: Secondary | ICD-10-CM | POA: Diagnosis not present

## 2023-10-19 DIAGNOSIS — N281 Cyst of kidney, acquired: Secondary | ICD-10-CM | POA: Diagnosis not present

## 2023-10-19 DIAGNOSIS — R310 Gross hematuria: Secondary | ICD-10-CM | POA: Diagnosis not present

## 2023-10-31 DIAGNOSIS — Z85828 Personal history of other malignant neoplasm of skin: Secondary | ICD-10-CM | POA: Diagnosis not present

## 2023-10-31 DIAGNOSIS — D2262 Melanocytic nevi of left upper limb, including shoulder: Secondary | ICD-10-CM | POA: Diagnosis not present

## 2023-10-31 DIAGNOSIS — L245 Irritant contact dermatitis due to other chemical products: Secondary | ICD-10-CM | POA: Diagnosis not present

## 2023-10-31 DIAGNOSIS — D225 Melanocytic nevi of trunk: Secondary | ICD-10-CM | POA: Diagnosis not present

## 2023-10-31 DIAGNOSIS — L812 Freckles: Secondary | ICD-10-CM | POA: Diagnosis not present

## 2023-10-31 DIAGNOSIS — D1801 Hemangioma of skin and subcutaneous tissue: Secondary | ICD-10-CM | POA: Diagnosis not present

## 2023-10-31 DIAGNOSIS — L570 Actinic keratosis: Secondary | ICD-10-CM | POA: Diagnosis not present

## 2023-10-31 DIAGNOSIS — L821 Other seborrheic keratosis: Secondary | ICD-10-CM | POA: Diagnosis not present

## 2023-11-30 DIAGNOSIS — G4733 Obstructive sleep apnea (adult) (pediatric): Secondary | ICD-10-CM | POA: Diagnosis not present

## 2023-11-30 DIAGNOSIS — R310 Gross hematuria: Secondary | ICD-10-CM | POA: Diagnosis not present

## 2023-11-30 DIAGNOSIS — R351 Nocturia: Secondary | ICD-10-CM | POA: Diagnosis not present

## 2023-11-30 DIAGNOSIS — N401 Enlarged prostate with lower urinary tract symptoms: Secondary | ICD-10-CM | POA: Diagnosis not present

## 2023-12-07 ENCOUNTER — Ambulatory Visit (HOSPITAL_COMMUNITY)
Admission: RE | Admit: 2023-12-07 | Discharge: 2023-12-07 | Disposition: A | Discharge status: Home / Self Care | Payer: Medicare Other | Source: Ambulatory Visit | Attending: Physician Assistant | Admitting: Physician Assistant

## 2023-12-07 VITALS — BP 140/66 | HR 67 | Ht 70.0 in | Wt 214.4 lb

## 2023-12-07 DIAGNOSIS — Z79899 Other long term (current) drug therapy: Secondary | ICD-10-CM | POA: Insufficient documentation

## 2023-12-07 DIAGNOSIS — D6869 Other thrombophilia: Secondary | ICD-10-CM | POA: Insufficient documentation

## 2023-12-07 DIAGNOSIS — I4819 Other persistent atrial fibrillation: Secondary | ICD-10-CM | POA: Diagnosis not present

## 2023-12-07 DIAGNOSIS — Z952 Presence of prosthetic heart valve: Secondary | ICD-10-CM | POA: Insufficient documentation

## 2023-12-07 DIAGNOSIS — Z5181 Encounter for therapeutic drug level monitoring: Secondary | ICD-10-CM | POA: Insufficient documentation

## 2023-12-07 DIAGNOSIS — I2510 Atherosclerotic heart disease of native coronary artery without angina pectoris: Secondary | ICD-10-CM | POA: Insufficient documentation

## 2023-12-07 DIAGNOSIS — Z7901 Long term (current) use of anticoagulants: Secondary | ICD-10-CM | POA: Diagnosis not present

## 2023-12-07 DIAGNOSIS — I509 Heart failure, unspecified: Secondary | ICD-10-CM | POA: Diagnosis not present

## 2023-12-07 DIAGNOSIS — I080 Rheumatic disorders of both mitral and aortic valves: Secondary | ICD-10-CM | POA: Diagnosis not present

## 2023-12-07 DIAGNOSIS — I110 Hypertensive heart disease with heart failure: Secondary | ICD-10-CM | POA: Diagnosis not present

## 2023-12-07 LAB — COMPREHENSIVE METABOLIC PANEL
ALT: 41 U/L (ref 0–44)
AST: 31 U/L (ref 15–41)
Albumin: 3.8 g/dL (ref 3.5–5.0)
Alkaline Phosphatase: 56 U/L (ref 38–126)
Anion gap: 8 (ref 5–15)
BUN: 21 mg/dL (ref 8–23)
CO2: 26 mmol/L (ref 22–32)
Calcium: 9.4 mg/dL (ref 8.9–10.3)
Chloride: 104 mmol/L (ref 98–111)
Creatinine, Ser: 1.25 mg/dL — ABNORMAL HIGH (ref 0.61–1.24)
GFR, Estimated: 55 mL/min — ABNORMAL LOW (ref >60)
Glucose, Bld: 116 mg/dL — ABNORMAL HIGH (ref 70–99)
Potassium: 4.8 mmol/L (ref 3.5–5.1)
Sodium: 138 mmol/L (ref 135–145)
Total Bilirubin: 1.5 mg/dL — ABNORMAL HIGH (ref 0.0–1.2)
Total Protein: 6.3 g/dL — ABNORMAL LOW (ref 6.5–8.1)

## 2023-12-07 LAB — TSH: TSH: 2.621 u[IU]/mL (ref 0.350–4.500)

## 2023-12-07 MED ORDER — DILTIAZEM HCL ER COATED BEADS 120 MG PO CP24: 120.0000 mg | ORAL_CAPSULE | Freq: Every day | ORAL | 6 refills | Status: DC

## 2023-12-07 NOTE — Patient Instructions (Addendum)
Decrease cardizem to 120mg once a day 

## 2023-12-07 NOTE — Progress Notes (Signed)
Primary Care Physician: Renford Dills, MD Referring Physician: Tereso Newcomer, PA Cardiologist: [name] EP: Dr Johney Frame   Patrick Franco is a 89 y.o. male with a h/o HTN, previous gastric ulcer with hemorrhage, moderate aortic stenosis, h/o CHF, CAD, afib on Tikosyn. He is in the afib clinic as he had a spell of dyspnea at bedtime last night and called EMS. He had just went to bed and was putting  on his CPAP and felt winded. He sat up and tried CPAP again and became short of breath again told his wife to call EMS. The fire department was there in a few minutes when he was still feeling short of breath and placed on monitor which showed SR. EMS arrived, EKG was done and it showed SR as well. His V/S were normal. It was not advised for him to go to the ER. He immediately felt better.  He  states that he may have panicked a little last night.  Episode lasted 25-30 mins. He also states that he has not felt well for the last few months. His PCP called this am to tell him that his HGB has dropped to 10.3 from 14.9 10/22 and is trying to get GI appointment asap. He has noted black stools.  He had a stress test 10/6 which was low risk and an echo in September did show progression of aortic stenosis from mild to moderate.   He is in SR today with stable BP. His weight today is 208 lbs and it was 218 lbs in September.    F/u in afib clinic, 12/29/21. He is staying in SR with stable qt. He did have a GI w/u last fall and was found to have a gastric ulcer. He was placed on a PPI, and oral iron and now CBC has returned to normal. He has not had any afib. No further dyspnea spells as described above.   F/u in afib clinic, 07/12/22. He is getting ready to have Aortic stenosis addressed, pending TAVR,  he is seeing Dr. [name] 9/13. He is in SR with bigeminal PVC's today. Tikosyn labs are pending. He takes his metoprolol and his spironolactone at noon and feels wiped out after that. His BP is soft today at 104/56. He  has had PVC's in the past but I have not seen bigeminal PVC's. Qt stable at 480 ms. He is unaware.    Follow up in the AF clinic 09/08/22. Patient underwent TAVR on 07/26/22. He presented to the ED 08/23/22 with abdominal pain and was found to have a small bowel obstruction secondary to an umbilical hernia. He underwent an umbilical hernia repair 08/25/22. His dofetilide was held during admission as he was NPO. Decision was made with EP team to discontinue dofetilide and start amiodarone. He feels well today and remains in SR.  Follow up in the AF clinic 12/07/22. Patient reports that he has done well since his last visit. He remains very active, going to cardiac rehab 3 days weekly and exercising on his own 2-3 other days. He denies any bleeding issues on anticoagulation.   Follow up in the AF clinic 12/07/23. Patient returns for follow up for atrial fibrillation. He states that he has done well from an afib standpoint. He remains very active by working out with a Psychologist, educational, riding a stationary bike, and walking in a pool. He is limited by his chronic back pain, followed by orthopedics. No bleeding issues on anticoagulation.   Today, he denies symptoms of  palpitations, chest pain, orthopnea, PND, lower extremity edema, dizziness, presyncope, syncope, snoring, daytime somnolence, bleeding, or neurologic sequela. The patient is tolerating medications without difficulties and is otherwise without complaint today.    Past Medical History:  Diagnosis Date   Aortic stenosis    Arthritis    Atrial fibrillation (HCC)    BPH (benign prostatic hyperplasia)    Chronic low back pain    Depression    Diverticulitis    Dyspnea    Elevated PSA    Foot drop, right    Gait disorder 02/03/2015   Hip pain    History of colonoscopy    History of nuclear stress test    Myoview 10/22: EF 67, no ischemia or infarction; low risk   HOH (hard of hearing)    hearing aid   Hypertension    Lumbar radiculopathy     Mitral valve prolapse    Onychomycosis    Peptic ulcer disease    S/P TAVR (transcatheter aortic valve replacement) 07/26/2022   26mm S3UR via TF approach with Dr. Clifton James and Dr. Charna Archer   Sepsis [facility]) 2005   e. coli-after prostate bx   Sleep apnea    Torn ACL    Tracheobronchomalacia    seen on 2019 Chest CT    Current Outpatient Medications  Medication Sig Dispense Refill   acetaminophen (TYLENOL) 500 MG tablet Take 1,000 mg by mouth as needed for moderate pain (pain score 4-6).     allopurinol (ZYLOPRIM) 100 MG tablet Take 2 tablets (200 mg total) by mouth daily.     amiodarone (PACERONE) 200 MG tablet Take 1 tablet (200 mg total) by mouth daily. 90 tablet 3   amoxicillin (AMOXIL) 500 MG tablet Take 4 tablets (2,000 mg total) by mouth as directed. 1 hour prior to dental work including cleanings 12 tablet 12   atorvastatin (LIPITOR) 10 MG tablet Take 1 tablet (10 mg total) by mouth every morning. 90 tablet 3   cyanocobalamin (VITAMIN B12) 1000 MCG tablet Take 1,000 mcg by mouth daily.     diltiazem (CARDIZEM CD) 240 MG 24 hr capsule TAKE (1) CAPSULE DAILY. 90 capsule 3   dutasteride (AVODART) 0.5 MG capsule Take 0.5 mg by mouth every morning.  2   ELIQUIS 5 MG TABS tablet TAKE 1 TABLET TWICE A DAY 180 tablet 3   furosemide (LASIX) 40 MG tablet Take 1.5 tablets (60 mg total) by mouth 4 (four) times a week. 30 tablet 11   metoprolol tartrate (LOPRESSOR) 25 MG tablet Take 1 tablet (25 mg total) by mouth daily. 90 tablet 3   mirtazapine (REMERON) 30 MG tablet TAKE ONE TABLET AT BEDTIME. 90 tablet 0   Multiple Vitamin (MULTIVITAMIN) capsule Take 1 capsule by mouth daily. Citroflex-     Multiple Vitamin (MULTIVITAMIN) tablet Take 1 tablet by mouth daily.     Omega-3 Fatty Acids (FISH OIL) 1200 MG CAPS Take 1,200 mg by mouth daily.     pantoprazole (PROTONIX) 40 MG tablet Take 1 tablet (40 mg total) by mouth daily.     Polyethyl Glycol-Propyl Glycol (SYSTANE) 0.4-0.3 % SOLN Place 1  drop into both eyes 2 (two) times daily.     potassium chloride SA (KLOR-CON M) 20 MEQ tablet TAKE 1 TABLET BY MOUTH IN THE MORNING, TAKE 1/2 TABLET BY MOUTH IN THE P.M. 135 tablet 3   Probiotic Product (PROBIOTIC DAILY PO) Take 1 tablet by mouth daily.     pyridOXINE (VITAMIN B-6) 100 MG tablet Take  100 mg by mouth daily.     spironolactone (ALDACTONE) 25 MG tablet Take 1 tablet (25 mg total) by mouth daily. 90 tablet 3   tamsulosin (FLOMAX) 0.4 MG CAPS capsule Take 0.4 mg by mouth daily after breakfast.  2   Turmeric 500 MG CAPS Take 500 mg by mouth daily.     No current facility-administered medications for this encounter.    ROS- All systems are reviewed and negative except as per the HPI above  Physical Exam: Vitals:   12/07/23 0904  BP: (!) 140/66  Pulse: 67  Weight: 97.3 kg  Height: 5\' 10"  (1.778 m)     Wt Readings from Last 3 Encounters:  12/07/23 97.3 kg  07/23/23 93.6 kg  07/21/23 93.6 kg    GEN: Well nourished, well developed in no acute distress NECK: No JVD; No carotid bruits CARDIAC: Regular rate and rhythm, no rubs, gallops. 2/6 systolic murmur  RESPIRATORY:  Clear to auscultation without rales, wheezing or rhonchi  ABDOMEN: Soft, non-tender, non-distended EXTREMITIES:  No edema; No deformity     EKG today demonstrates SR, 1st degree AV block Vent. rate 67 BPM PR interval 352 ms QRS duration 98 ms QT/QTcB 428/452 ms   Echo 09/28/22  1. Left ventricular ejection fraction, by estimation, is 65 to 70%. The  left ventricle has normal function. The left ventricle has no regional  wall motion abnormalities. There is moderate asymmetric left ventricular hypertrophy of the basal-septal segment. Left ventricular diastolic parameters are indeterminate.   2. Right ventricular systolic function is normal. The right ventricular  size is normal. Tricuspid regurgitation signal is inadequate for assessing PA pressure.   3. Left atrial size was moderately dilated.    4. Right atrial size was mildly dilated.   5. The mitral valve is degenerative. Bileaflet prolapse. Mild to moderate mitral valve regurgitation. Severe mitral annular calcification.   6. The aortic valve has been repaired/replaced. Aortic valve  regurgitation is mild. There is a 26 mm Edwards Sapien prosthetic (TAVR) valve present in the aortic position. Mild PVL. MG , Vmax 2.6 m/s, EOA 1.9 cm^2, DI 0.6   7. The inferior vena cava is normal in size with greater than 50%  respiratory variability, suggesting right atrial pressure of 3 mmHg.    Epic records reviewed   CHA2DS2-VASc Score = 5  The patient's score is based upon: CHF History: 1 HTN History: 1 Diabetes History: 0 Stroke History: 0 Vascular Disease History: 1 Age Score: 2 Gender Score: 0       ASSESSMENT AND PLAN: Persistent Atrial Fibrillation (ICD10:  I48.19) The patient's CHA2DS2-VASc score is 5, indicating a 7.2% annual risk of stroke.   Patient appears to be maintaining SR. His baseline PR interval is usually 250-280 ms. It is much longer today at 352 ms. Will decrease diltiazem to 120 mg daily. If PR remains > 300 at follow up, could consider discontinuing diltiazem altogether.  Continue amiodarone 200 mg daily Continue Eliquis 5 mg BID Continue metoprolol 25 mg daily  Secondary Hypercoagulable State (ICD10:  D68.69) The patient is at significant risk for stroke/thromboembolism based upon his CHA2DS2-VASc Score of 5.  Continue Apixaban (Eliquis). No bleeding issues.   High Risk Medication Monitoring (ICD 10: Z79.899) PR interval prolonged on ECG. Decrease diltiazem to 120 mg daily as above. Continue amiodarone 200 mg daily.  Check cmet/TSH today  Aortic stenosis  S/p TAVR 07/26/22 Followed by [name]  HTN Stable on current regimen Decrease diltiazem as above  CAD No anginal symptoms Followed by [name]   Follow up with [name] as scheduled. AF clinic in 6 months.    Jorja Loa  PA-C Afib Clinic [facility] [address] [place], [place] [address] [phone]

## 2023-12-09 ENCOUNTER — Other Ambulatory Visit: Payer: Self-pay | Admitting: Urology

## 2023-12-09 DIAGNOSIS — R399 Unspecified symptoms and signs involving the genitourinary system: Secondary | ICD-10-CM | POA: Diagnosis not present

## 2023-12-09 MED ORDER — NITROFURANTOIN MONOHYD MACRO 100 MG PO CAPS: 100.0000 mg | ORAL_CAPSULE | Freq: Two times a day (BID) | ORAL | 0 refills | Status: DC

## 2023-12-31 NOTE — Progress Notes (Unsigned)
No chief complaint on file.  History of Present Illness: 89 yo male with history of CAD, aortic stenosis s/p TAVR, HTN, mitral valve prolapse, chronic diastolic CHF, atrial fibrillation and sleep apnea. Cardiac cath in July 2023 with heavily calcified mid to distal LAD stenosis that could not be crossed. Moderate mid Circumflex stenosis. He had severe aortic stenosis and had TAVR in September 2023 (26 mm Edwards Sapien 3 THV). He has atrial fibrillation and has been followed in the atrial fibrillation clinic. He has been managed with amiodarone, Cardizem and metoprolol. zziness. Nuclear stress test May 2nd Tikosyn. Echo September 2024 with LVEF=60-65%. Severe LVH. Moderate MR. Normally functioning AVR with mild PVL. Mean gradient 14 mmHg.   He is here today for follow up. The patient denies any chest pain, dyspnea, palpitations, lower extremity edema, orthopnea, PND, dizziness, near syncope or syncope.    Primary Care Physician: Renford Dills, MD  Past Medical History:  Diagnosis Date   Aortic stenosis    Arthritis    Atrial fibrillation (HCC)    BPH (benign prostatic hyperplasia)    Chronic low back pain    Depression    Diverticulitis    Dyspnea    Elevated PSA    Foot drop, right    Gait disorder 02/03/2015   Hip pain    History of colonoscopy    History of nuclear stress test    Myoview 10/22: EF 67, no ischemia or infarction; low risk   HOH (hard of hearing)    hearing aid   Hypertension    Lumbar radiculopathy    Mitral valve prolapse    Onychomycosis    Peptic ulcer disease    S/P TAVR (transcatheter aortic valve replacement) 07/26/2022   26mm S3UR via TF approach with Dr. Clifton James and Dr. Charna Archer   Sepsis [facility]) 2005   e. coli-after prostate bx   Sleep apnea    Torn ACL    Tracheobronchomalacia    seen on 2019 Chest CT    Past Surgical History:  Procedure Laterality Date   BIOPSY  10/05/2021   Procedure: BIOPSY;  Surgeon: Kerin Salen, MD;  Location:  [facility] ENDOSCOPY;  Service: Gastroenterology;;   CARDIAC CATHETERIZATION     CARDIOVERSION N/A 03/27/2020   Procedure: CARDIOVERSION;  Surgeon: Chilton Si, MD;  Location: [facility] ENDOSCOPY;  Service: Cardiovascular;  Laterality: N/A;   CATARACT EXTRACTION Bilateral    ESOPHAGOGASTRODUODENOSCOPY N/A 08/19/2015   Procedure: ESOPHAGOGASTRODUODENOSCOPY (EGD);  Surgeon: Carman Ching, MD;  Location: [facility] ENDOSCOPY;  Service: Endoscopy;  Laterality: N/A;   ESOPHAGOGASTRODUODENOSCOPY N/A 10/05/2021   Procedure: ESOPHAGOGASTRODUODENOSCOPY (EGD);  Surgeon: Kerin Salen, MD;  Location: [facility] ENDOSCOPY;  Service: Gastroenterology;  Laterality: N/A;   INTRAOPERATIVE TRANSTHORACIC ECHOCARDIOGRAM N/A 07/26/2022   Procedure: INTRAOPERATIVE TRANSTHORACIC ECHOCARDIOGRAM;  Surgeon: Kathleene Hazel, MD;  Location[facility] OR;  Service: Open Heart Surgery;  Laterality: N/A;   right knee replacement Right 1996   RIGHT/LEFT HEART CATH AND CORONARY ANGIOGRAPHY N/A 05/26/2022   Procedure: RIGHT/LEFT HEART CATH AND CORONARY ANGIOGRAPHY;  Surgeon: Kathleene Hazel, MD;  Location: [facility] INVASIVE CV LAB;  Service: Cardiovascular;  Laterality: N/A;   SHOULDER SURGERY     TEE WITHOUT CARDIOVERSION N/A 03/27/2020   Procedure: TRANSESOPHAGEAL ECHOCARDIOGRAM (TEE);  Surgeon: Chilton Si, MD;  Location: [facility] ENDOSCOPY;  Service: Cardiovascular;  Laterality: N/A;   TONSILLECTOMY     TOTAL HIP ARTHROPLASTY Right 08/13/2019   Procedure: RIGHT TOTAL HIP ARTHROPLASTY ANTERIOR APPROACH;  Surgeon: Kathryne Hitch, MD;  Location: [facility]  OR;  Service: Orthopedics;  Laterality: Right;   TRANSCATHETER AORTIC VALVE REPLACEMENT, TRANSFEMORAL N/A 07/26/2022   Procedure: Transcatheter Aortic Valve Replacement, Transfemoral using a 26 MM Edwards SAPIEN 3 Ultra;  Surgeon: Kathleene Hazel, MD;  Location: [facility] OR;  Service: Open Heart Surgery;  Laterality: N/A;  Transfemoral approach   UMBILICAL HERNIA REPAIR N/A 08/25/2022    Procedure: UMBILICAL HERNIA REPAIR WITH POSSIBLE MESH;  Surgeon: Chevis Pretty III, MD;  Location: [facility] ORS;  Service: General;  Laterality: N/A;    Current Outpatient Medications  Medication Sig Dispense Refill   acetaminophen (TYLENOL) 500 MG tablet Take 1,000 mg by mouth as needed for moderate pain (pain score 4-6).     allopurinol (ZYLOPRIM) 100 MG tablet Take 2 tablets (200 mg total) by mouth daily.     amiodarone (PACERONE) 200 MG tablet Take 1 tablet (200 mg total) by mouth daily. 90 tablet 3   amoxicillin (AMOXIL) 500 MG tablet Take 4 tablets (2,000 mg total) by mouth as directed. 1 hour prior to dental work including cleanings 12 tablet 12   atorvastatin (LIPITOR) 10 MG tablet Take 1 tablet (10 mg total) by mouth every morning. 90 tablet 3   cyanocobalamin (VITAMIN B12) 1000 MCG tablet Take 1,000 mcg by mouth daily.     diltiazem (CARDIZEM CD) 120 MG 24 hr capsule Take 1 capsule (120 mg total) by mouth daily. 30 capsule 6   dutasteride (AVODART) 0.5 MG capsule Take 0.5 mg by mouth every morning.  2   ELIQUIS 5 MG TABS tablet TAKE 1 TABLET TWICE A DAY 180 tablet 3   furosemide (LASIX) 40 MG tablet Take 1.5 tablets (60 mg total) by mouth 4 (four) times a week. 30 tablet 11   metoprolol tartrate (LOPRESSOR) 25 MG tablet Take 1 tablet (25 mg total) by mouth daily. 90 tablet 3   mirtazapine (REMERON) 30 MG tablet TAKE ONE TABLET AT BEDTIME. 90 tablet 0   Multiple Vitamin (MULTIVITAMIN) capsule Take 1 capsule by mouth daily. Citroflex-     Multiple Vitamin (MULTIVITAMIN) tablet Take 1 tablet by mouth daily.     nitrofurantoin, macrocrystal-monohydrate, (MACROBID) 100 MG capsule Take 1 capsule (100 mg total) by mouth every 12 (twelve) hours. 14 capsule 0   Omega-3 Fatty Acids (FISH OIL) 1200 MG CAPS Take 1,200 mg by mouth daily.     pantoprazole (PROTONIX) 40 MG tablet Take 1 tablet (40 mg total) by mouth daily.     Polyethyl Glycol-Propyl Glycol (SYSTANE) 0.4-0.3 % SOLN Place 1 drop into both  eyes 2 (two) times daily.     potassium chloride SA (KLOR-CON M) 20 MEQ tablet TAKE 1 TABLET BY MOUTH IN THE MORNING, TAKE 1/2 TABLET BY MOUTH IN THE P.M. 135 tablet 3   Probiotic Product (PROBIOTIC DAILY PO) Take 1 tablet by mouth daily.     pyridOXINE (VITAMIN B-6) 100 MG tablet Take 100 mg by mouth daily.     spironolactone (ALDACTONE) 25 MG tablet Take 1 tablet (25 mg total) by mouth daily. 90 tablet 3   tamsulosin (FLOMAX) 0.4 MG CAPS capsule Take 0.4 mg by mouth daily after breakfast.  2   Turmeric 500 MG CAPS Take 500 mg by mouth daily.     No current facility-administered medications for this visit.    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   Patient drinks 1 cup of coffee per day.   Social Drivers of Corporate investment banker Strain: Not on file  Food Insecurity: No Food Insecurity (07/11/2023)   Received from [facility]   Hunger Vital Sign    Worried About Running Out of Food in the Last Year: Never true    Ran  Out of Food in the Last Year: Never true  Transportation Needs: No Transportation Needs (07/11/2023)   Received from [facility]   PRAPARE - Administrator, Civil Service (Medical): No    Lack of Transportation (Non-Medical): No  Physical Activity: Not on file  Stress: Not on file  Social Connections: Not on file  Intimate Partner Violence: Not At Risk (07/11/2023)   Received from [facility]   Humiliation, Afraid, Rape, and Kick questionnaire    Fear of Current or Ex-Partner: No    Emotionally Abused: No    Physically Abused: No    Sexually Abused: No    Family History  Problem Relation Age of Onset   Pneumonia Mother 30   Cancer - Lung Father 67   Multiple myeloma Brother 41    Review of Systems:  As stated in the HPI and otherwise negative.   There were no vitals taken for this visit.  Physical Examination:  General: Well developed, well nourished, NAD  HEENT: OP clear, mucus membranes moist  SKIN: warm, dry. No rashes. Neuro: No focal deficits  Musculoskeletal: Muscle strength 5/5 all ext  Psychiatric: Mood and affect normal  Neck: No JVD, no carotid bruits, no thyromegaly, no lymphadenopathy.  Lungs:Clear bilaterally, no wheezes, rhonci, crackles Cardiovascular: Regular rate and rhythm.***  No murmurs, gallops or rubs. Abdomen:Soft. Bowel sounds present. Non-tender.  Extremities: No lower extremity edema. Pulses are 2 + in the bilateral DP/PT.  EKG:  EKG is *** ordered today. The ekg ordered today demonstrates   Recent Labs: 07/23/2023: Hemoglobin 14.8; Platelets 266 12/07/2023: ALT 41; BUN 21; Creatinine, Ser 1.25; Potassium 4.8; Sodium 138; TSH 2.621   Lipid Panel    Component Value Date/Time   CHOL 121 03/07/2023 0739   TRIG 89 03/07/2023 0739   HDL 53 03/07/2023 0739   CHOLHDL 2.3 03/07/2023 0739   CHOLHDL 2.0 03/06/2020 0330   VLDL 10 03/06/2020 0330   LDLCALC 51 03/07/2023 0739     Wt  Readings from Last 3 Encounters:  12/07/23 97.3 kg  07/23/23 93.6 kg  07/21/23  93.6 kg    Assessment and Plan:   Severe Aortic stenosis:  Doing well post TAVR in September 2023. He is on Eliquis. SBE prophylaxis when necessary.    2.  Chronic diastolic CHF: No volume overload on exam. Wt stable. Continue Lasix.    3.  CAD without angina: Severe LAD stenosis that I could not cross with a wire. He has done well with medical management of his CAD. Continue statin and beta blocker. He is not on an ASA since he is on Eliquis. [name]   4. Atrial fib/flutter: Sinus today. Continue amiodarone, Cardizem and Metoprolol. Continue Eliquis. CHADS  VASC score of 5.    5. HTN: BP is well controlled. Continue current therapy  6. Hyperlipidemia: LDL at goal in April 2024. Repeat lipids and LFTs now.   Labs/ tests ordered today include:   No orders of the defined types were placed in this encounter.  Disposition:   F/U with me in one year.    Signed, Verne Carrow, MD 12/31/2023 4:46 PM    [facility] [address] [place], [place], [place]  [address] Phone: [phone]; Fax: [phone]

## 2024-01-01 ENCOUNTER — Ambulatory Visit: Payer: Medicare Other | Attending: Cardiovascular Disease | Admitting: Cardiovascular Disease

## 2024-01-01 ENCOUNTER — Other Ambulatory Visit: Payer: Self-pay | Admitting: *Deleted

## 2024-01-01 ENCOUNTER — Encounter: Payer: Self-pay | Admitting: Cardiovascular Disease

## 2024-01-01 VITALS — BP 136/78 | HR 73 | Ht 70.0 in | Wt 214.6 lb

## 2024-01-01 DIAGNOSIS — I5032 Chronic diastolic (congestive) heart failure: Secondary | ICD-10-CM | POA: Insufficient documentation

## 2024-01-01 DIAGNOSIS — Z952 Presence of prosthetic heart valve: Secondary | ICD-10-CM | POA: Insufficient documentation

## 2024-01-01 DIAGNOSIS — I480 Paroxysmal atrial fibrillation: Secondary | ICD-10-CM | POA: Diagnosis not present

## 2024-01-01 DIAGNOSIS — I10 Essential (primary) hypertension: Secondary | ICD-10-CM | POA: Diagnosis not present

## 2024-01-01 DIAGNOSIS — E782 Mixed hyperlipidemia: Secondary | ICD-10-CM | POA: Diagnosis not present

## 2024-01-01 NOTE — Patient Instructions (Signed)
Medication Instructions:  No changes *If you need a refill on your cardiac medications before your next appointment, please call your pharmacy*   Lab Work: Go to [facility] for blood work today: lipids    Follow-Up: At [facility], you and your health needs are our priority.  As part of our continuing mission to provide you with exceptional heart care, we have created designated Provider Care Teams.  These Care Teams include your primary Cardiologist (physician) and Advanced Practice Providers (APPs -  Physician Assistants and Nurse Practitioners) who all work together to provide you with the care you need, when you need it.   Your next appointment:   12 month(s)  Provider:   Verne Carrow, MD

## 2024-01-16 DIAGNOSIS — E782 Mixed hyperlipidemia: Secondary | ICD-10-CM | POA: Diagnosis not present

## 2024-01-17 LAB — LIPID PANEL
Chol/HDL Ratio: 2.4 ratio (ref 0.0–5.0)
Cholesterol, Total: 135 mg/dL (ref 100–199)
HDL: 56 mg/dL (ref >39)
LDL Chol Calc (NIH): 56 mg/dL (ref 0–99)
Triglycerides: 133 mg/dL (ref 0–149)
VLDL Cholesterol Cal: 23 mg/dL (ref 5–40)

## 2024-02-06 DIAGNOSIS — I10 Essential (primary) hypertension: Secondary | ICD-10-CM | POA: Diagnosis not present

## 2024-02-06 DIAGNOSIS — I480 Paroxysmal atrial fibrillation: Secondary | ICD-10-CM | POA: Diagnosis not present

## 2024-02-06 DIAGNOSIS — I700 Atherosclerosis of aorta: Secondary | ICD-10-CM | POA: Diagnosis not present

## 2024-02-06 DIAGNOSIS — I5030 Unspecified diastolic (congestive) heart failure: Secondary | ICD-10-CM | POA: Diagnosis not present

## 2024-02-12 DIAGNOSIS — I700 Atherosclerosis of aorta: Secondary | ICD-10-CM | POA: Diagnosis not present

## 2024-02-12 DIAGNOSIS — I2510 Atherosclerotic heart disease of native coronary artery without angina pectoris: Secondary | ICD-10-CM | POA: Diagnosis not present

## 2024-02-12 DIAGNOSIS — I10 Essential (primary) hypertension: Secondary | ICD-10-CM | POA: Diagnosis not present

## 2024-02-12 DIAGNOSIS — I480 Paroxysmal atrial fibrillation: Secondary | ICD-10-CM | POA: Diagnosis not present

## 2024-02-12 DIAGNOSIS — I5030 Unspecified diastolic (congestive) heart failure: Secondary | ICD-10-CM | POA: Diagnosis not present

## 2024-02-21 ENCOUNTER — Emergency Department (HOSPITAL_BASED_OUTPATIENT_CLINIC_OR_DEPARTMENT_OTHER)

## 2024-02-21 ENCOUNTER — Other Ambulatory Visit: Payer: Self-pay

## 2024-02-21 DIAGNOSIS — R140 Abdominal distension (gaseous): Secondary | ICD-10-CM | POA: Diagnosis not present

## 2024-02-21 DIAGNOSIS — N200 Calculus of kidney: Secondary | ICD-10-CM | POA: Diagnosis not present

## 2024-02-21 DIAGNOSIS — R1084 Generalized abdominal pain: Secondary | ICD-10-CM | POA: Diagnosis not present

## 2024-02-21 DIAGNOSIS — K5730 Diverticulosis of large intestine without perforation or abscess without bleeding: Secondary | ICD-10-CM | POA: Diagnosis not present

## 2024-02-21 DIAGNOSIS — Z79899 Other long term (current) drug therapy: Secondary | ICD-10-CM | POA: Insufficient documentation

## 2024-02-21 DIAGNOSIS — K7689 Other specified diseases of liver: Secondary | ICD-10-CM | POA: Diagnosis not present

## 2024-02-21 DIAGNOSIS — Z7901 Long term (current) use of anticoagulants: Secondary | ICD-10-CM | POA: Insufficient documentation

## 2024-02-21 LAB — CBC
HCT: 44.7 % (ref 39.0–52.0)
Hemoglobin: 15 g/dL (ref 13.0–17.0)
MCH: 33.9 pg (ref 26.0–34.0)
MCHC: 33.6 g/dL (ref 30.0–36.0)
MCV: 101.1 fL — ABNORMAL HIGH (ref 80.0–100.0)
Platelets: 203 10*3/uL (ref 150–400)
RBC: 4.42 MIL/uL (ref 4.22–5.81)
RDW: 14.4 % (ref 11.5–15.5)
WBC: 10.1 10*3/uL (ref 4.0–10.5)
nRBC: 0 % (ref 0.0–0.2)

## 2024-02-21 LAB — COMPREHENSIVE METABOLIC PANEL WITH GFR
ALT: 29 U/L (ref 0–44)
AST: 37 U/L (ref 15–41)
Albumin: 4.7 g/dL (ref 3.5–5.0)
Alkaline Phosphatase: 58 U/L (ref 38–126)
Anion gap: 10 (ref 5–15)
BUN: 36 mg/dL — ABNORMAL HIGH (ref 8–23)
CO2: 23 mmol/L (ref 22–32)
Calcium: 9.5 mg/dL (ref 8.9–10.3)
Chloride: 104 mmol/L (ref 98–111)
Creatinine, Ser: 1.2 mg/dL (ref 0.61–1.24)
GFR, Estimated: 58 mL/min — ABNORMAL LOW (ref >60)
Glucose, Bld: 102 mg/dL — ABNORMAL HIGH (ref 70–99)
Potassium: 5.4 mmol/L — ABNORMAL HIGH (ref 3.5–5.1)
Sodium: 137 mmol/L (ref 135–145)
Total Bilirubin: 1.1 mg/dL (ref 0.0–1.2)
Total Protein: 7.4 g/dL (ref 6.5–8.1)

## 2024-02-21 LAB — URINALYSIS, ROUTINE W REFLEX MICROSCOPIC
Bilirubin Urine: NEGATIVE
Glucose, UA: NEGATIVE mg/dL
Hgb urine dipstick: NEGATIVE
Ketones, ur: NEGATIVE mg/dL
Nitrite: NEGATIVE
Protein, ur: NEGATIVE mg/dL
Specific Gravity, Urine: 1.021 (ref 1.005–1.030)
WBC, UA: >50 WBC/hpf (ref 0–5)
pH: 5 (ref 5.0–8.0)

## 2024-02-21 LAB — LIPASE, BLOOD: Lipase: 29 U/L (ref 11–51)

## 2024-02-21 MED ORDER — IOHEXOL 300 MG/ML  SOLN
100.0000 mL | Freq: Once | INTRAMUSCULAR | Status: AC | PRN
  Administered 2024-02-21: 100 mL via INTRAVENOUS

## 2024-02-21 NOTE — ED Provider Triage Note (Signed)
 Emergency Medicine Provider Triage Evaluation Note  Patrick Franco , a 89 y.o. male  was evaluated in triage.  Pt complains of lower abdominal pain and cramping that started today.  States that this feels similar to previous SBO she has had in the past.  Noted to have multiple past abdominal surgeries as well as a previous medical history of A-fib and aortic stenosis, diverticulitis.  States that the symptoms have since improved since being in the ED.  Last bowel movement was yesterday where he had 2 bowel movements which were normal, no hematochezia, no diarrhea, no melena.  Has passed gas today.  Denies fever, nausea, vomiting, dysuria, chest pain, back pain  Review of Systems  Positive: N/A Negative: N/A  Physical Exam  There were no vitals taken for this visit. Gen:   Awake, no distress   Resp:  Normal effort  MSK:   Moves extremities without difficulty  Other:    Medical Decision Making  Medically screening exam initiated at 8:42 PM.  Appropriate orders placed.  Patrick Franco was informed that the remainder of the evaluation will be completed by another provider, this initial triage assessment does not replace that evaluation, and the importance of remaining in the ED until their evaluation is complete.     Patrick Franco, [place] 02/21/24 2047

## 2024-02-21 NOTE — ED Triage Notes (Signed)
 RLQ LLQ pain. Started 1430. Last BM yesterday-normal. -N/-V. Denies CP, SOB. Feels abd is tight. HX afib and CHF- denies more swelling that baseline.

## 2024-02-22 ENCOUNTER — Emergency Department (HOSPITAL_BASED_OUTPATIENT_CLINIC_OR_DEPARTMENT_OTHER)
Admission: EM | Admit: 2024-02-22 | Discharge: 2024-02-22 | Disposition: A | Discharge status: Home / Self Care | Attending: Emergency Medicine | Admitting: Emergency Medicine

## 2024-02-22 DIAGNOSIS — R1084 Generalized abdominal pain: Secondary | ICD-10-CM

## 2024-02-22 MED ORDER — ONDANSETRON 4 MG PO TBDP: ORAL_TABLET | ORAL | 0 refills | Status: DC

## 2024-02-22 NOTE — ED Notes (Signed)
 Pt tolerated PO challenge well.

## 2024-02-22 NOTE — ED Provider Notes (Signed)
 [place] EMERGENCY DEPARTMENT AT [facility] Provider Note   CSN: [id number] Arrival date & time: 02/21/24  1948     History  Chief Complaint  Patient presents with   Abdominal Pain    Patrick Franco is a 89 y.o. male.  89 yo M with a chief complaints of abdominal cramping.  This started earlier in the day.  Is pretty much resolved now.  He has had a history of bowel obstructions in the past and also diverticulitis.  Has been passing gas without issue.  No vomiting.  No sick contacts to suspicious food intake.   Abdominal Pain      Home Medications Prior to Admission medications   Medication Sig Start Date End Date Taking? Authorizing Provider  ondansetron (ZOFRAN-ODT) 4 MG disintegrating tablet 4mg  ODT q4 hours prn nausea/vomit 02/22/24  Yes Melene Plan, DO  acetaminophen (TYLENOL) 500 MG tablet Take 1,000 mg by mouth as needed for moderate pain (pain score 4-6).    [provider]  allopurinol (ZYLOPRIM) 100 MG tablet Take 2 tablets (200 mg total) by mouth daily. 12/29/21   Newman Nip, NP  amiodarone (PACERONE) 200 MG tablet Take 1 tablet (200 mg total) by mouth daily. 07/21/23   Janetta Hora, PA-C  amoxicillin (AMOXIL) 500 MG tablet Take 4 tablets (2,000 mg total) by mouth as directed. 1 hour prior to dental work including cleanings 08/05/22   Janetta Hora, PA-C  atorvastatin (LIPITOR) 10 MG tablet Take 1 tablet (10 mg total) by mouth every morning. 02/28/23   Tereso Newcomer T, PA-C  cyanocobalamin (VITAMIN B12) 1000 MCG tablet Take 1,000 mcg by mouth daily.    [provider]  diltiazem (CARDIZEM CD) 120 MG 24 hr capsule Take 1 capsule (120 mg total) by mouth daily. 12/07/23   Fenton, Clint R, PA  dutasteride (AVODART) 0.5 MG capsule Take 0.5 mg by mouth every morning. 01/06/15   [provider]  ELIQUIS 5 MG TABS tablet TAKE 1 TABLET TWICE A DAY 06/01/23   Kathleene Hazel, MD  furosemide (LASIX) 40 MG tablet Take 1.5  tablets (60 mg total) by mouth 4 (four) times a week. 02/28/23   Tereso Newcomer T, PA-C  metoprolol tartrate (LOPRESSOR) 25 MG tablet Take 1 tablet (25 mg total) by mouth daily. 02/28/23   Tereso Newcomer T, PA-C  mirtazapine (REMERON) 30 MG tablet TAKE ONE TABLET AT BEDTIME. 08/03/20   Judi Saa, DO  Multiple Vitamin (MULTIVITAMIN) capsule Take 1 capsule by mouth daily. Citroflex-    [provider]  Multiple Vitamin (MULTIVITAMIN) tablet Take 1 tablet by mouth daily.    [provider]  nitrofurantoin, macrocrystal-monohydrate, (MACROBID) 100 MG capsule Take 1 capsule (100 mg total) by mouth every 12 (twelve) hours. 12/09/23   McKenzie, Mardene Celeste, MD  Omega-3 Fatty Acids (FISH OIL) 1200 MG CAPS Take 1,200 mg by mouth daily.    [provider]  pantoprazole (PROTONIX) 40 MG tablet Take 1 tablet (40 mg total) by mouth daily. 12/29/21 12/07/23  Newman Nip, NP  Polyethyl Glycol-Propyl Glycol (SYSTANE) 0.4-0.3 % SOLN Place 1 drop into both eyes 2 (two) times daily.    [provider]  potassium chloride SA (KLOR-CON M) 20 MEQ tablet TAKE 1 TABLET BY MOUTH IN THE MORNING, TAKE 1/2 TABLET BY MOUTH IN THE P.M. 02/28/23   Tereso Newcomer T, PA-C  Probiotic Product (PROBIOTIC DAILY PO) Take 1 tablet by mouth daily.    [provider]  pyridOXINE (VITAMIN B-6) 100 MG tablet Take 100 mg by mouth daily.    [provider]  spironolactone (ALDACTONE) 25 MG tablet Take 1 tablet (25 mg total) by mouth daily. 02/28/23   Tereso Newcomer T, PA-C  tamsulosin (FLOMAX) 0.4 MG CAPS capsule Take 0.4 mg by mouth daily after breakfast. 01/06/15   [provider]  Turmeric 500 MG CAPS Take 500 mg by mouth daily.    [provider]      Allergies    Patient has no known allergies.    Review of Systems   Review of Systems  Gastrointestinal:  Positive for abdominal pain.    Physical Exam Updated Vital Signs BP (!) 164/85 (BP Location: Right Arm)    Pulse 67   Temp 97.7 F (36.5 C)   Resp 16   SpO2 96%  Physical Exam Vitals and nursing note reviewed.  Constitutional:      Appearance: He is well-developed.  HENT:     Head: Normocephalic and atraumatic.  Eyes:     Pupils: Pupils are equal, round, and reactive to light.  Neck:     Vascular: No JVD.  Cardiovascular:     Rate and Rhythm: Normal rate and regular rhythm.     Heart sounds: No murmur heard.    No friction rub. No gallop.  Pulmonary:     Effort: No respiratory distress.     Breath sounds: No wheezing.  Abdominal:     General: There is distension.     Tenderness: There is no abdominal tenderness. There is no guarding or rebound.     Comments: Mild distention  Musculoskeletal:        General: Normal range of motion.     Cervical back: Normal range of motion and neck supple.  Skin:    Coloration: Skin is not pale.     Findings: No rash.  Neurological:     Mental Status: He is alert and oriented to person, place, and time.  Psychiatric:        Behavior: Behavior normal.     ED Results / Procedures / Treatments   Labs (all labs ordered are listed, but only abnormal results are displayed) Labs Reviewed  COMPREHENSIVE METABOLIC PANEL WITH GFR - Abnormal; Notable for the following components:      Result Value   Potassium 5.4 (*)    Glucose, Bld 102 (*)    BUN 36 (*)    GFR, Estimated 58 (*)    All other components within normal limits  CBC - Abnormal; Notable for the following components:   MCV 101.1 (*)    All other components within normal limits  URINALYSIS, ROUTINE W REFLEX MICROSCOPIC - Abnormal; Notable for the following components:   Leukocytes,Ua LARGE (*)    Bacteria, UA RARE (*)    All other components within normal limits  LIPASE, BLOOD    EKG None  Radiology CT ABDOMEN PELVIS W CONTRAST Result Date: 02/22/2024 CLINICAL DATA:  Lower quadrant pain nausea vomiting EXAM: CT ABDOMEN AND PELVIS WITH CONTRAST TECHNIQUE: Multidetector CT  imaging of the abdomen and pelvis was performed using the standard protocol following bolus administration of intravenous contrast. RADIATION DOSE REDUCTION: This exam was performed according to the departmental dose-optimization program which includes automated exposure control, adjustment of the mA and/or kV according to patient size and/or use of iterative reconstruction technique. CONTRAST:  OMNIPAQUE IOHEXOL 300 MG/ML  SOLN COMPARISON:  CT 07/23/2023 FINDINGS: Lower chest: Lung bases demonstrate atelectasis  or scarring at the left base. Cardiomegaly with partially visualized aortic valve prosthesis. Coronary vascular calcification. Cardiomegaly. Hepatobiliary: Stable hepatic cyst. No calcified gallstone or biliary dilatation Pancreas: Unremarkable. No pancreatic ductal dilatation or surrounding inflammatory changes. Spleen: Normal in size without focal abnormality. Adrenals/Urinary Tract: Adrenal glands are normal. Kidneys show no hydronephrosis. The bladder is unremarkable. Punctate nonobstructing right kidney stone. Stomach/Bowel: The stomach is nonenlarged. Multiple air and fluid slightly distended small bowel loops within the abdomen, measuring up to 3.6 cm. No well-defined transition point. Negative appendix. Extensive diverticular disease of the left colon. No acute wall thickening. Some fluid in the left lower quadrant on series 2, image 62 but nonspecific. Vascular/Lymphatic: Aortic atherosclerosis. No enlarged abdominal or pelvic lymph nodes. Reproductive: Status post hysterectomy. No adnexal masses. Other: No free air. Musculoskeletal: Right hip replacement with artifact. Scoliosis and degenerative changes of the spine. No acute osseous abnormality. Advanced degenerative changes of the left hip. IMPRESSION: 1. Multiple air and fluid slightly distended small bowel loops within the abdomen, measuring up to 3.6 cm. No well-defined transition point. Findings could be secondary to ileus/enteritis or  partial small bowel obstruction. 2. Extensive diverticular disease of the left colon without convincing acute wall thickening. Small amount of fluid in the left lower quadrant but nonspecific, near sigmoid colon diverticula but no definite wall thickening or stranding to suggest diverticulum as the source of the free fluid. 3. Cardiomegaly. 4. Punctate nonobstructing right kidney stone. 5. Aortic atherosclerosis. Electronically Signed   By: Jasmine Pang M.D.   On: 02/22/2024 00:36    Procedures Procedures    Medications Ordered in ED Medications  iohexol (OMNIPAQUE) 300 MG/ML solution 100 mL (100 mLs Intravenous Contrast Given 02/21/24 2143)    ED Course/ Medical Decision Making/ A&P                                 Medical Decision Making Amount and/or Complexity of Data Reviewed Labs: ordered. Radiology: ordered.  Risk Prescription drug management.   89 yo M with a chief complaints of abdominal distention, abdominal cramping.  This occurred earlier today and is improved.  He was concerned about recurrent small bowel obstruction or diverticulitis.  CT imaging with dilated loops of bowel without transition point.  Thought to be ileus versus enteritis.  As the patient is feeling better it seems less likely to be an obstruction.  Will oral trial here.  Reassess.  LFTs and lipase are unremarkable.  No leukocytosis.  No anemia.  Oral trial without issue here.  Would like to go home.  3:35 AM:  I have discussed the diagnosis/risks/treatment options with the patient and family.  Evaluation and diagnostic testing in the emergency department does not suggest an emergent condition requiring admission or immediate intervention beyond what has been performed at this time.  They will follow up with PCP. We also discussed returning to the ED immediately if new or worsening sx occur. We discussed the sx which are most concerning (e.g., sudden worsening pain, fever, inability to tolerate by mouth)  that necessitate immediate return. Medications administered to the patient during their visit and any new prescriptions provided to the patient are listed below.  Medications given during this visit Medications  iohexol (OMNIPAQUE) 300 MG/ML solution 100 mL (100 mLs Intravenous Contrast Given 02/21/24 2143)     The patient appears reasonably screen and/or stabilized for discharge and I doubt any other medical condition or other  EMC requiring further screening, evaluation, or treatment in the ED at this time prior to discharge.          Final Clinical Impression(s) / ED Diagnoses Final diagnoses:  Generalized abdominal pain    Rx / DC Orders ED Discharge Orders          Ordered    ondansetron (ZOFRAN-ODT) 4 MG disintegrating tablet        02/22/24 0234              Melene Plan, DO 02/22/24 0335

## 2024-02-22 NOTE — Discharge Instructions (Signed)
 Please return for wo worsening pain inability eat or drink.

## 2024-02-26 DIAGNOSIS — R8290 Unspecified abnormal findings in urine: Secondary | ICD-10-CM | POA: Diagnosis not present

## 2024-02-26 DIAGNOSIS — R0600 Dyspnea, unspecified: Secondary | ICD-10-CM | POA: Diagnosis not present

## 2024-02-26 DIAGNOSIS — R109 Unspecified abdominal pain: Secondary | ICD-10-CM | POA: Diagnosis not present

## 2024-03-06 DIAGNOSIS — I5030 Unspecified diastolic (congestive) heart failure: Secondary | ICD-10-CM | POA: Diagnosis not present

## 2024-03-06 DIAGNOSIS — I480 Paroxysmal atrial fibrillation: Secondary | ICD-10-CM | POA: Diagnosis not present

## 2024-03-06 DIAGNOSIS — I10 Essential (primary) hypertension: Secondary | ICD-10-CM | POA: Diagnosis not present

## 2024-03-06 DIAGNOSIS — I700 Atherosclerosis of aorta: Secondary | ICD-10-CM | POA: Diagnosis not present

## 2024-03-11 DIAGNOSIS — R319 Hematuria, unspecified: Secondary | ICD-10-CM | POA: Diagnosis not present

## 2024-03-13 DIAGNOSIS — I5030 Unspecified diastolic (congestive) heart failure: Secondary | ICD-10-CM | POA: Diagnosis not present

## 2024-03-13 DIAGNOSIS — I2510 Atherosclerotic heart disease of native coronary artery without angina pectoris: Secondary | ICD-10-CM | POA: Diagnosis not present

## 2024-03-13 DIAGNOSIS — I480 Paroxysmal atrial fibrillation: Secondary | ICD-10-CM | POA: Diagnosis not present

## 2024-03-13 DIAGNOSIS — I10 Essential (primary) hypertension: Secondary | ICD-10-CM | POA: Diagnosis not present

## 2024-03-13 DIAGNOSIS — I700 Atherosclerosis of aorta: Secondary | ICD-10-CM | POA: Diagnosis not present

## 2024-03-18 ENCOUNTER — Other Ambulatory Visit: Payer: Self-pay | Admitting: Physician Assistant

## 2024-03-19 DIAGNOSIS — Z77122 Contact with and (suspected) exposure to noise: Secondary | ICD-10-CM | POA: Diagnosis not present

## 2024-03-19 DIAGNOSIS — H903 Sensorineural hearing loss, bilateral: Secondary | ICD-10-CM | POA: Diagnosis not present

## 2024-03-19 DIAGNOSIS — Z822 Family history of deafness and hearing loss: Secondary | ICD-10-CM | POA: Diagnosis not present

## 2024-04-05 DIAGNOSIS — I5030 Unspecified diastolic (congestive) heart failure: Secondary | ICD-10-CM | POA: Diagnosis not present

## 2024-04-05 DIAGNOSIS — I480 Paroxysmal atrial fibrillation: Secondary | ICD-10-CM | POA: Diagnosis not present

## 2024-04-05 DIAGNOSIS — I10 Essential (primary) hypertension: Secondary | ICD-10-CM | POA: Diagnosis not present

## 2024-04-05 DIAGNOSIS — I700 Atherosclerosis of aorta: Secondary | ICD-10-CM | POA: Diagnosis not present

## 2024-04-07 ENCOUNTER — Other Ambulatory Visit: Payer: Self-pay | Admitting: Physician Assistant

## 2024-04-13 DIAGNOSIS — I480 Paroxysmal atrial fibrillation: Secondary | ICD-10-CM | POA: Diagnosis not present

## 2024-04-13 DIAGNOSIS — I10 Essential (primary) hypertension: Secondary | ICD-10-CM | POA: Diagnosis not present

## 2024-04-13 DIAGNOSIS — I5030 Unspecified diastolic (congestive) heart failure: Secondary | ICD-10-CM | POA: Diagnosis not present

## 2024-04-13 DIAGNOSIS — I2510 Atherosclerotic heart disease of native coronary artery without angina pectoris: Secondary | ICD-10-CM | POA: Diagnosis not present

## 2024-04-13 DIAGNOSIS — I700 Atherosclerosis of aorta: Secondary | ICD-10-CM | POA: Diagnosis not present

## 2024-04-14 ENCOUNTER — Other Ambulatory Visit: Payer: Self-pay | Admitting: Physician Assistant

## 2024-04-15 DIAGNOSIS — Z952 Presence of prosthetic heart valve: Secondary | ICD-10-CM | POA: Diagnosis not present

## 2024-04-15 DIAGNOSIS — I10 Essential (primary) hypertension: Secondary | ICD-10-CM | POA: Diagnosis not present

## 2024-04-15 DIAGNOSIS — I700 Atherosclerosis of aorta: Secondary | ICD-10-CM | POA: Diagnosis not present

## 2024-04-15 DIAGNOSIS — Z23 Encounter for immunization: Secondary | ICD-10-CM | POA: Diagnosis not present

## 2024-04-15 DIAGNOSIS — N401 Enlarged prostate with lower urinary tract symptoms: Secondary | ICD-10-CM | POA: Diagnosis not present

## 2024-04-15 DIAGNOSIS — Z1331 Encounter for screening for depression: Secondary | ICD-10-CM | POA: Diagnosis not present

## 2024-04-15 DIAGNOSIS — R269 Unspecified abnormalities of gait and mobility: Secondary | ICD-10-CM | POA: Diagnosis not present

## 2024-04-15 DIAGNOSIS — I5030 Unspecified diastolic (congestive) heart failure: Secondary | ICD-10-CM | POA: Diagnosis not present

## 2024-04-15 DIAGNOSIS — Z0000 Encounter for general adult medical examination without abnormal findings: Secondary | ICD-10-CM | POA: Diagnosis not present

## 2024-04-15 DIAGNOSIS — E7800 Pure hypercholesterolemia, unspecified: Secondary | ICD-10-CM | POA: Diagnosis not present

## 2024-04-15 DIAGNOSIS — I480 Paroxysmal atrial fibrillation: Secondary | ICD-10-CM | POA: Diagnosis not present

## 2024-04-16 ENCOUNTER — Emergency Department (HOSPITAL_BASED_OUTPATIENT_CLINIC_OR_DEPARTMENT_OTHER): Admitting: Radiology

## 2024-04-16 ENCOUNTER — Encounter (HOSPITAL_BASED_OUTPATIENT_CLINIC_OR_DEPARTMENT_OTHER): Payer: Self-pay

## 2024-04-16 ENCOUNTER — Telehealth: Payer: Self-pay | Admitting: Cardiovascular Disease

## 2024-04-16 ENCOUNTER — Other Ambulatory Visit: Payer: Self-pay

## 2024-04-16 ENCOUNTER — Observation Stay (HOSPITAL_BASED_OUTPATIENT_CLINIC_OR_DEPARTMENT_OTHER)
Admission: EM | Admit: 2024-04-16 | Discharge: 2024-04-17 | Disposition: A | Discharge status: Home / Self Care | Attending: Internal Medicine | Admitting: Internal Medicine

## 2024-04-16 DIAGNOSIS — I110 Hypertensive heart disease with heart failure: Secondary | ICD-10-CM | POA: Insufficient documentation

## 2024-04-16 DIAGNOSIS — R0989 Other specified symptoms and signs involving the circulatory and respiratory systems: Secondary | ICD-10-CM | POA: Diagnosis not present

## 2024-04-16 DIAGNOSIS — Z66 Do not resuscitate: Secondary | ICD-10-CM | POA: Insufficient documentation

## 2024-04-16 DIAGNOSIS — E785 Hyperlipidemia, unspecified: Secondary | ICD-10-CM | POA: Diagnosis not present

## 2024-04-16 DIAGNOSIS — Z7901 Long term (current) use of anticoagulants: Secondary | ICD-10-CM | POA: Insufficient documentation

## 2024-04-16 DIAGNOSIS — N400 Enlarged prostate without lower urinary tract symptoms: Secondary | ICD-10-CM | POA: Insufficient documentation

## 2024-04-16 DIAGNOSIS — E66811 Obesity, class 1: Secondary | ICD-10-CM | POA: Diagnosis not present

## 2024-04-16 DIAGNOSIS — I2510 Atherosclerotic heart disease of native coronary artery without angina pectoris: Secondary | ICD-10-CM | POA: Diagnosis not present

## 2024-04-16 DIAGNOSIS — I5032 Chronic diastolic (congestive) heart failure: Secondary | ICD-10-CM | POA: Diagnosis not present

## 2024-04-16 DIAGNOSIS — R001 Bradycardia, unspecified: Secondary | ICD-10-CM | POA: Diagnosis not present

## 2024-04-16 DIAGNOSIS — Z6831 Body mass index (BMI) 31.0-31.9, adult: Secondary | ICD-10-CM | POA: Diagnosis not present

## 2024-04-16 DIAGNOSIS — K219 Gastro-esophageal reflux disease without esophagitis: Secondary | ICD-10-CM | POA: Insufficient documentation

## 2024-04-16 DIAGNOSIS — I517 Cardiomegaly: Secondary | ICD-10-CM | POA: Diagnosis not present

## 2024-04-16 DIAGNOSIS — Z79899 Other long term (current) drug therapy: Secondary | ICD-10-CM | POA: Insufficient documentation

## 2024-04-16 DIAGNOSIS — E669 Obesity, unspecified: Secondary | ICD-10-CM | POA: Diagnosis present

## 2024-04-16 DIAGNOSIS — R079 Chest pain, unspecified: Secondary | ICD-10-CM | POA: Diagnosis not present

## 2024-04-16 DIAGNOSIS — I4819 Other persistent atrial fibrillation: Secondary | ICD-10-CM | POA: Diagnosis present

## 2024-04-16 DIAGNOSIS — I5030 Unspecified diastolic (congestive) heart failure: Secondary | ICD-10-CM | POA: Diagnosis present

## 2024-04-16 DIAGNOSIS — Z952 Presence of prosthetic heart valve: Secondary | ICD-10-CM | POA: Diagnosis not present

## 2024-04-16 DIAGNOSIS — F32A Depression, unspecified: Secondary | ICD-10-CM | POA: Diagnosis not present

## 2024-04-16 DIAGNOSIS — M109 Gout, unspecified: Secondary | ICD-10-CM | POA: Insufficient documentation

## 2024-04-16 DIAGNOSIS — F0631 Mood disorder due to known physiological condition with depressive features: Secondary | ICD-10-CM | POA: Insufficient documentation

## 2024-04-16 DIAGNOSIS — I10 Essential (primary) hypertension: Secondary | ICD-10-CM | POA: Diagnosis present

## 2024-04-16 LAB — BASIC METABOLIC PANEL WITH GFR
Anion gap: 13 (ref 5–15)
BUN: 27 mg/dL — ABNORMAL HIGH (ref 8–23)
CO2: 24 mmol/L (ref 22–32)
Calcium: 9.8 mg/dL (ref 8.9–10.3)
Chloride: 98 mmol/L (ref 98–111)
Creatinine, Ser: 1.11 mg/dL (ref 0.61–1.24)
GFR, Estimated: >60 mL/min (ref >60)
Glucose, Bld: 113 mg/dL — ABNORMAL HIGH (ref 70–99)
Potassium: 5.3 mmol/L — ABNORMAL HIGH (ref 3.5–5.1)
Sodium: 135 mmol/L (ref 135–145)

## 2024-04-16 LAB — CBC
HCT: 44.5 % (ref 39.0–52.0)
Hemoglobin: 14.7 g/dL (ref 13.0–17.0)
MCH: 34.1 pg — ABNORMAL HIGH (ref 26.0–34.0)
MCHC: 33 g/dL (ref 30.0–36.0)
MCV: 103.2 fL — ABNORMAL HIGH (ref 80.0–100.0)
Platelets: 185 10*3/uL (ref 150–400)
RBC: 4.31 MIL/uL (ref 4.22–5.81)
RDW: 14.1 % (ref 11.5–15.5)
WBC: 7.3 10*3/uL (ref 4.0–10.5)
nRBC: 0 % (ref 0.0–0.2)

## 2024-04-16 LAB — PRO BRAIN NATRIURETIC PEPTIDE: Pro Brain Natriuretic Peptide: 372 pg/mL — ABNORMAL HIGH (ref <300.0)

## 2024-04-16 LAB — TROPONIN T, HIGH SENSITIVITY
Troponin T High Sensitivity: <15 ng/L (ref <19)
Troponin T High Sensitivity: <15 ng/L (ref <19)

## 2024-04-16 NOTE — Telephone Encounter (Signed)
 Patient called in to me directly stating he's having heart trouble. Reports that his BP at noon was 130/70, at 330pm it was 149/85 w/ HR of 49. He is worried about his HR stating it hasn't gone above 50. He isn't sure if he would see someone or not.   STAT if HR is under 50 or over 120  (normal HR is 60-100 beats per minute)  What is your heart rate? 49  Do you have a log of your heart rate readings (document readings)? 45 around noon, 49 at 330pm  Do you have any other symptoms? Dizziness, sob - not new, has been going on for a while   STAT if patient feels like he/she is going to faint   Are you dizzy, lightheaded, or faint now? Yes, a little lightheaded   Have you passed out? No IF YES MOVE TO .SYNCOPECVD  Do you have any other symptoms? No, reports no other symptoms   Have you checked your HR and BP (record if available)? See above  Call transferred to triage.

## 2024-04-16 NOTE — ED Notes (Signed)
 Kim with [facility] called for transport

## 2024-04-16 NOTE — Telephone Encounter (Signed)
 Call to patient who states he feels lightheaded and short of breath, and he has noticed his heart rate is low.   Patient states he has been SOB for several months, and has seen his PCP about it twice in the last 2 months. He states "I can barely walk 20 yards before I have to sit and rest". He also states he has been feeling lightheaded on standing for a few days. He denies any chest pain or palpitations and reports he has mild leg swelling that he manages weekly with lasix . He does not feel he has an increase in leg swelling at this time.  He reports today his HR is much lower than normal and has been so all day. His HR at this time is 45 and his BP is 145/72. On reviewing his medications, he reports that normally he takes his amiodarone  and cardizem  in the morning and metoprolol  later in the day. Today, however, he states he took all three together and has been feeling bad ever since. Because patient is audibly SOB on the phone, I advised him to call 911 or go to the ED. Patient verbalizes understanding and agrees to plan.

## 2024-04-16 NOTE — ED Triage Notes (Signed)
 Pt c/o bradycardia, dizziness, SHOB x "awhile," cards nurse advised to be evaluated. Pt speaking in full sentences, denies any CP, known increase in swelling of BLE.   Hx CHF, afib, compliant w all home meds

## 2024-04-16 NOTE — ED Provider Notes (Signed)
 [place] EMERGENCY DEPARTMENT AT [facility] Provider Note   CSN: [id number] Arrival date & time: 04/16/24  1612     History  Chief Complaint  Patient presents with   Dizziness   Bradycardia    Patrick Franco is a 89 y.o. male with past medical history significant for CAD, paroxysmal A-fib, diastolic heart failure, status post transcatheter aortic valve replacement who presents concern for dizziness, shortness of breath for a while, bradycardia apparently worse today and called by nurse at cardiologist to come to the emergency department for evaluation.  Reports lightheadedness is worse than his normal.   Dizziness      Home Medications Prior to Admission medications   Medication Sig Start Date End Date Taking? Authorizing Provider  acetaminophen  (TYLENOL ) 500 MG tablet Take 1,000 mg by mouth as needed for moderate pain (pain score 4-6).    [provider]  allopurinol  (ZYLOPRIM ) 100 MG tablet Take 2 tablets (200 mg total) by mouth daily. 12/29/21   Asa Lauth, NP  amiodarone  (PACERONE ) 200 MG tablet Take 1 tablet (200 mg total) by mouth daily. 07/21/23   Ardia Kraft, PA-C  amoxicillin  (AMOXIL ) 500 MG tablet Take 4 tablets (2,000 mg total) by mouth as directed. 1 hour prior to dental work including cleanings 08/05/22   Ardia Kraft, PA-C  atorvastatin  (LIPITOR) 10 MG tablet Take 1 tablet (10 mg total) by mouth every morning. 04/16/24   Odie Benne, MD  cyanocobalamin  (VITAMIN B12) 1000 MCG tablet Take 1,000 mcg by mouth daily.    [provider]  diltiazem  (CARDIZEM  CD) 120 MG 24 hr capsule Take 1 capsule (120 mg total) by mouth daily. 12/07/23   Fenton, Clint R, PA  dutasteride  (AVODART ) 0.5 MG capsule Take 0.5 mg by mouth every morning. 01/06/15   [provider]  ELIQUIS  5 MG TABS tablet TAKE 1 TABLET TWICE A DAY 06/01/23   Odie Benne, MD  furosemide  (LASIX ) 40 MG tablet TAKE 1 AND 1/2 TABLETS BY MOUTH FOUR  times a WEEK. 04/10/24   Odie Benne, MD  metoprolol  tartrate (LOPRESSOR ) 25 MG tablet Take 1 tablet (25 mg total) by mouth daily. 04/10/24   Odie Benne, MD  mirtazapine  (REMERON ) 30 MG tablet TAKE ONE TABLET AT BEDTIME. 08/03/20   Smith, Zachary M, DO  Multiple Vitamin (MULTIVITAMIN) capsule Take 1 capsule by mouth daily. Citroflex-    [provider]  Multiple Vitamin (MULTIVITAMIN) tablet Take 1 tablet by mouth daily.    [provider]  nitrofurantoin , macrocrystal-monohydrate, (MACROBID ) 100 MG capsule Take 1 capsule (100 mg total) by mouth every 12 (twelve) hours. 12/09/23   McKenzie, Arden Beck, MD  Omega-3 Fatty Acids (FISH OIL) 1200 MG CAPS Take 1,200 mg by mouth daily.    [provider]  ondansetron  (ZOFRAN -ODT) 4 MG disintegrating tablet 4mg  ODT q4 hours prn nausea/vomit 02/22/24   Floyd, Dan, DO  pantoprazole  (PROTONIX ) 40 MG tablet Take 1 tablet (40 mg total) by mouth daily. 12/29/21 12/07/23  Asa Lauth, NP  Polyethyl Glycol-Propyl Glycol (SYSTANE) 0.4-0.3 % SOLN Place 1 drop into both eyes 2 (two) times daily.    [provider]  potassium chloride  SA (KLOR-CON  M) 20 MEQ tablet TAKE 1 TABLET BY MOUTH IN THE MORNING, TAKE 1/2 TABLET BY MOUTH IN THE P.M. 03/19/24   Marlyse Single T, PA-C  Probiotic Product (PROBIOTIC DAILY PO) Take 1 tablet by mouth daily.    [provider]  pyridOXINE (VITAMIN  B-6) 100 MG tablet Take 100 mg by mouth daily.    [provider]  spironolactone  (ALDACTONE ) 25 MG tablet Take 1 tablet (25 mg total) by mouth daily. 04/16/24   Odie Benne, MD  tamsulosin  (FLOMAX ) 0.4 MG CAPS capsule Take 0.4 mg by mouth daily after breakfast. 01/06/15   [provider]  Turmeric 500 MG CAPS Take 500 mg by mouth daily.    [provider]      Allergies    Patient has no known allergies.    Review of Systems   Review of Systems  Neurological:  Positive for dizziness.   All other systems reviewed and are negative.   Physical Exam Updated Vital Signs BP 133/75   Pulse (!) 58   Temp 97.7 F (36.5 C) (Oral)   Resp 17   SpO2 96%  Physical Exam Vitals and nursing note reviewed.  Constitutional:      General: He is not in acute distress.    Appearance: Normal appearance.  HENT:     Head: Normocephalic and atraumatic.  Eyes:     General:        Right eye: No discharge.        Left eye: No discharge.  Cardiovascular:     Rate and Rhythm: Regular rhythm. Bradycardia present.     Heart sounds: No murmur heard.    No friction rub. No gallop.  Pulmonary:     Effort: Pulmonary effort is normal.     Breath sounds: Normal breath sounds.  Abdominal:     General: Bowel sounds are normal.     Palpations: Abdomen is soft.  Musculoskeletal:     Comments: Mild 1+ nonpitting edema bilaterally  Skin:    General: Skin is warm and dry.     Capillary Refill: Capillary refill takes less than 2 seconds.  Neurological:     Mental Status: He is alert and oriented to person, place, and time.  Psychiatric:        Mood and Affect: Mood normal.        Behavior: Behavior normal.     ED Results / Procedures / Treatments   Labs (all labs ordered are listed, but only abnormal results are displayed) Labs Reviewed  BASIC METABOLIC PANEL WITH GFR - Abnormal; Notable for the following components:      Result Value   Potassium 5.3 (*)    Glucose, Bld 113 (*)    BUN 27 (*)    All other components within normal limits  CBC - Abnormal; Notable for the following components:   MCV 103.2 (*)    MCH 34.1 (*)    All other components within normal limits  PRO BRAIN NATRIURETIC PEPTIDE - Abnormal; Notable for the following components:   Pro Brain Natriuretic Peptide 372.0 (*)    All other components within normal limits  TROPONIN T, HIGH SENSITIVITY  TROPONIN T, HIGH SENSITIVITY    EKG EKG Interpretation Date/Time:  Tuesday April 16 2024 16:28:21 EDT Ventricular  Rate:  45 PR Interval:  360 QRS Duration:  94 QT Interval:  494 QTC Calculation: 427 R Axis:   25  Text Interpretation: Sinus bradycardia with 1st degree A-V block Possible Anterior infarct (cited on or before 07-Dec-2023) Abnormal ECG When compared with ECG of 07-Dec-2023 09:16, Vent. rate has decreased BY  22 BPM QRS axis Shifted right Confirmed by Trish Furl [phone]) on 04/16/2024 4:43:49 PM  Radiology DG Chest 2 View Result Date: 04/16/2024 CLINICAL DATA:  Chest pain EXAM: CHEST - 2 VIEW COMPARISON:  Chest x-ray 02/26/2024 FINDINGS: The heart is enlarged. Patient is status post TAVR. There is central pulmonary vascular congestion. There is no focal lung consolidation, pleural effusion or pneumothorax. There is dextroconvex curvature of the thoracic spine. IMPRESSION: Cardiomegaly with central pulmonary vascular congestion. Electronically Signed   By: Tyron Gallon M.D.   On: 04/16/2024 18:15    Procedures Procedures    Medications Ordered in ED Medications - No data to display  ED Course/ Medical Decision Making/ A&P                                 Medical Decision Making Amount and/or Complexity of Data Reviewed Labs: ordered. Radiology: ordered.   This patient is a 89 y.o. male  who presents to the ED for concern of dizziness, shob related to low heart rate.   Differential diagnoses prior to evaluation: The emergent differential diagnosis includes, but is not limited to,  BPPV, vestibular migraine, head trauma, AVM, intracranial tumor, multiple sclerosis, drug-related, CVA, orthostatic hypotension, sepsis, hypoglycemia, electrolyte disturbance, anemia, anxiety, asthma exacerbation, COPD exacerbation, acute upper respiratory infection, acute bronchitis, chronic bronchitis, interstitial lung disease, ARDS, PE, pneumonia, atypical ACS, carbon monoxide poisoning, spontaneous pneumothorax, new CHF vs CHF exacerbation, versus other . This is not an exhaustive differential.   Past  Medical History / Co-morbidities / Social History: CAD, paroxysmal A-fib, diastolic heart failure, status post transcatheter aortic valve replacement   Additional history: Chart reviewed. Pertinent results include: extensively reviewed previous cardiology records, echoes, labwork, imaging from previous ED visits  Physical Exam: Physical exam performed. The pertinent findings include: bradycardia with normal rate, mild nonpitting edema of lower extremities  Lab Tests/Imaging studies: I personally interpreted labs/imaging and the pertinent results include:  bmp notable for elevated potassium, 5.3, bnp mildly elevated at 372, negative troponin x 2 with no active chest pain, . Cardiomegaly with central vascular congestion noted on chest xray. I agree with the radiologist interpretation.  Cardiac monitoring: EKG obtained and interpreted by myself and attending physician which shows: sinus bradycardia with first degree AV block   Medications: I ordered medication including will plan to hold beta blockers and monitor overnight.  I have reviewed the patients home medicines and have made adjustments as needed.  Consults: Spoke with hospitalist, Dr. Michell Ahumada who agrees to admission for symptomatic bradycardia and plan for overnight observation.   Disposition: After consideration of the diagnostic results and the patients response to treatment, I feel that patient would benefit from admission as above .  Final Clinical Impression(s) / ED Diagnoses Final diagnoses:  None    Rx / DC Orders ED Discharge Orders     None         Stefan Edge 04/16/24 2138    Trish Furl, MD 04/18/24 [phone]

## 2024-04-16 NOTE — Plan of Care (Signed)
 Plan of Care Note for accepted transfer   Patient name: Patrick Franco:[id number] DOB: [birth year]  Facility requesting transfer: [facility] ED Requesting Provider: Stefan Edge Facility course: 89 year old male with history of CAD, paroxysmal A-fib/flutter on Eliquis , chronic HFpEF, history of aortic valve replacement, hypertension, hyperlipidemia presented to the ED due to concerns for dizziness, shortness of breath, and bradycardia.  Heart rate in the 40s on arrival and EKG showing sinus bradycardia with first-degree AV block.  Not hypotensive.  Patient takes metoprolol  and Cardizem  at home.  Chest x-ray showing cardiomegaly with pulmonary vascular congestion.  Labs showing potassium 5.3, BUN 27, creatinine 1.1, initial troponin negative and repeat pending, pro BNP 372.  Not tachypneic or hypoxic.  Plan of care: The patient is accepted for admission to Progressive unit at [facility].  [facility] will assume care on arrival to accepting facility. Until arrival, care as per EDP. However, [facility] available 24/7 for questions and assistance.  Check [email or web address] for on-call coverage.  Nursing staff, please call [facility] Admits & Consults System-Wide number under Amion on patient's arrival so appropriate admitting provider can evaluate the pt.

## 2024-04-17 DIAGNOSIS — E785 Hyperlipidemia, unspecified: Secondary | ICD-10-CM | POA: Diagnosis not present

## 2024-04-17 DIAGNOSIS — E66811 Obesity, class 1: Secondary | ICD-10-CM | POA: Diagnosis not present

## 2024-04-17 DIAGNOSIS — R42 Dizziness and giddiness: Secondary | ICD-10-CM | POA: Diagnosis not present

## 2024-04-17 DIAGNOSIS — Z7401 Bed confinement status: Secondary | ICD-10-CM | POA: Diagnosis not present

## 2024-04-17 DIAGNOSIS — N400 Enlarged prostate without lower urinary tract symptoms: Secondary | ICD-10-CM | POA: Diagnosis not present

## 2024-04-17 DIAGNOSIS — Z952 Presence of prosthetic heart valve: Secondary | ICD-10-CM | POA: Diagnosis not present

## 2024-04-17 DIAGNOSIS — I2510 Atherosclerotic heart disease of native coronary artery without angina pectoris: Secondary | ICD-10-CM | POA: Diagnosis not present

## 2024-04-17 DIAGNOSIS — M109 Gout, unspecified: Secondary | ICD-10-CM | POA: Diagnosis not present

## 2024-04-17 DIAGNOSIS — I4430 Unspecified atrioventricular block: Secondary | ICD-10-CM | POA: Diagnosis not present

## 2024-04-17 DIAGNOSIS — Z6831 Body mass index (BMI) 31.0-31.9, adult: Secondary | ICD-10-CM | POA: Diagnosis not present

## 2024-04-17 DIAGNOSIS — Z7901 Long term (current) use of anticoagulants: Secondary | ICD-10-CM | POA: Diagnosis not present

## 2024-04-17 DIAGNOSIS — F0631 Mood disorder due to known physiological condition with depressive features: Secondary | ICD-10-CM | POA: Diagnosis not present

## 2024-04-17 DIAGNOSIS — Z66 Do not resuscitate: Secondary | ICD-10-CM | POA: Diagnosis not present

## 2024-04-17 DIAGNOSIS — F32A Depression, unspecified: Secondary | ICD-10-CM | POA: Diagnosis not present

## 2024-04-17 DIAGNOSIS — I10 Essential (primary) hypertension: Secondary | ICD-10-CM | POA: Diagnosis not present

## 2024-04-17 DIAGNOSIS — R001 Bradycardia, unspecified: Secondary | ICD-10-CM | POA: Diagnosis not present

## 2024-04-17 LAB — CBC WITH DIFFERENTIAL/PLATELET
Abs Immature Granulocytes: 0.02 10*3/uL (ref 0.00–0.07)
Basophils Absolute: 0 10*3/uL (ref 0.0–0.1)
Basophils Relative: 1 %
Eosinophils Absolute: 0.1 10*3/uL (ref 0.0–0.5)
Eosinophils Relative: 2 %
HCT: 42.1 % (ref 39.0–52.0)
Hemoglobin: 14 g/dL (ref 13.0–17.0)
Immature Granulocytes: 0 %
Lymphocytes Relative: 27 %
Lymphs Abs: 1.6 10*3/uL (ref 0.7–4.0)
MCH: 33.8 pg (ref 26.0–34.0)
MCHC: 33.3 g/dL (ref 30.0–36.0)
MCV: 101.7 fL — ABNORMAL HIGH (ref 80.0–100.0)
Monocytes Absolute: 0.5 10*3/uL (ref 0.1–1.0)
Monocytes Relative: 9 %
Neutro Abs: 3.7 10*3/uL (ref 1.7–7.7)
Neutrophils Relative %: 61 %
Platelets: 180 10*3/uL (ref 150–400)
RBC: 4.14 MIL/uL — ABNORMAL LOW (ref 4.22–5.81)
RDW: 14 % (ref 11.5–15.5)
WBC: 6 10*3/uL (ref 4.0–10.5)
nRBC: 0 % (ref 0.0–0.2)

## 2024-04-17 LAB — COMPREHENSIVE METABOLIC PANEL WITH GFR
ALT: 30 U/L (ref 0–44)
AST: 24 U/L (ref 15–41)
Albumin: 3.5 g/dL (ref 3.5–5.0)
Alkaline Phosphatase: 48 U/L (ref 38–126)
Anion gap: 8 (ref 5–15)
BUN: 23 mg/dL (ref 8–23)
CO2: 27 mmol/L (ref 22–32)
Calcium: 9.3 mg/dL (ref 8.9–10.3)
Chloride: 104 mmol/L (ref 98–111)
Creatinine, Ser: 1.06 mg/dL (ref 0.61–1.24)
GFR, Estimated: >60 mL/min (ref >60)
Glucose, Bld: 98 mg/dL (ref 70–99)
Potassium: 5 mmol/L (ref 3.5–5.1)
Sodium: 139 mmol/L (ref 135–145)
Total Bilirubin: 1.3 mg/dL — ABNORMAL HIGH (ref 0.0–1.2)
Total Protein: 6.2 g/dL — ABNORMAL LOW (ref 6.5–8.1)

## 2024-04-17 LAB — MAGNESIUM: Magnesium: 2.2 mg/dL (ref 1.7–2.4)

## 2024-04-17 MED ORDER — ACETAMINOPHEN 325 MG PO TABS: 650.0000 mg | ORAL_TABLET | Freq: Four times a day (QID) | ORAL | Status: DC | PRN

## 2024-04-17 MED ORDER — TURMERIC 500 MG PO CAPS: 500.0000 mg | ORAL_CAPSULE | Freq: Every day | ORAL | Status: DC

## 2024-04-17 MED ORDER — OMEGA-3-ACID ETHYL ESTERS 1 G PO CAPS: 1.0000 g | ORAL_CAPSULE | Freq: Every day | ORAL | Status: DC

## 2024-04-17 MED ORDER — TAMSULOSIN HCL 0.4 MG PO CAPS: 0.4000 mg | ORAL_CAPSULE | Freq: Every day | ORAL | Status: DC

## 2024-04-17 MED ORDER — APIXABAN 5 MG PO TABS: 5.0000 mg | ORAL_TABLET | Freq: Two times a day (BID) | ORAL | Status: DC

## 2024-04-17 MED ORDER — MIRTAZAPINE 15 MG PO TABS: 30.0000 mg | ORAL_TABLET | Freq: Every day | ORAL | Status: DC

## 2024-04-17 MED ORDER — POLYVINYL ALCOHOL 1.4 % OP SOLN: 1.0000 [drp] | Freq: Two times a day (BID) | OPHTHALMIC | Status: DC | PRN

## 2024-04-17 MED ORDER — ATORVASTATIN CALCIUM 10 MG PO TABS: 10.0000 mg | ORAL_TABLET | Freq: Every morning | ORAL | Status: DC

## 2024-04-17 MED ORDER — RISAQUAD PO CAPS
1.0000 | ORAL_CAPSULE | Freq: Every day | ORAL | Status: DC
  Filled 2024-04-17: qty 1

## 2024-04-17 MED ORDER — DUTASTERIDE 0.5 MG PO CAPS
0.5000 mg | ORAL_CAPSULE | Freq: Every morning | ORAL | Status: DC
  Filled 2024-04-17: qty 1

## 2024-04-17 MED ORDER — ALLOPURINOL 100 MG PO TABS: 200.0000 mg | ORAL_TABLET | Freq: Every day | ORAL | Status: DC

## 2024-04-17 MED ORDER — VITAMIN B-6 100 MG PO TABS
100.0000 mg | ORAL_TABLET | Freq: Every day | ORAL | Status: DC
  Filled 2024-04-17: qty 1

## 2024-04-17 MED ORDER — ADULT MULTIVITAMIN W/MINERALS CH: 1.0000 | ORAL_TABLET | Freq: Every day | ORAL | Status: DC

## 2024-04-17 MED ORDER — ACETAMINOPHEN 650 MG RE SUPP
650.0000 mg | Freq: Four times a day (QID) | RECTAL | Status: DC | PRN
Start: 2024-04-17 — End: 2024-04-17

## 2024-04-17 MED ORDER — ONDANSETRON HCL 4 MG/2ML IJ SOLN: 4.0000 mg | Freq: Four times a day (QID) | INTRAMUSCULAR | Status: DC | PRN

## 2024-04-17 MED ORDER — MELATONIN 3 MG PO TABS: 3.0000 mg | ORAL_TABLET | Freq: Every evening | ORAL | Status: DC | PRN

## 2024-04-17 MED ORDER — PANTOPRAZOLE SODIUM 40 MG PO TBEC: 40.0000 mg | DELAYED_RELEASE_TABLET | Freq: Every day | ORAL | Status: DC

## 2024-04-17 MED ORDER — SPIRONOLACTONE 25 MG PO TABS: 25.0000 mg | ORAL_TABLET | Freq: Every day | ORAL | Status: DC

## 2024-04-17 NOTE — Plan of Care (Signed)

## 2024-04-17 NOTE — Progress Notes (Signed)
(  Carryover admission to the Day Admitter; accepted by Dr. Michell Ahumada as transfer from  [facility]  to a  pcu bed at  [facility]  for bradycardia. Please see Dr. Ceasar Codding transfer progress note for additional details).  VS upon arrival at [facility], notable for the following: HR 59, BP 136/78; RR 17-18, O2 sat 97 % on RA.   I have placed some additional preliminary admit orders via the adult multi-morbid admission order set. I have also ordered morning labs in the form of CMP, CBC, and magnesium  level. Holding home metoprolol  as well as diltiazem .    Camelia Cavalier, DO Hospitalist

## 2024-04-17 NOTE — Progress Notes (Signed)
 Heart Failure Navigator Progress Note  Assessed for Heart & Vascular TOC clinic readiness.  Patient does not meet criteria due to EF 60-65%, has a scheduled [facility] appointment on 04/24/2024. No HF TOC. .   Navigator will sign off at this time.   Randie Bustle, BSN, Scientist, clinical (histocompatibility and immunogenetics) Only

## 2024-04-17 NOTE — Discharge Summary (Signed)
 Physician Discharge Summary   Patient: Patrick Franco MRN: [id number] DOB: 12/13/1931  Admit date:     04/16/2024  Discharge date: 04/17/24  Discharge Physician: Lorita Rosa   PCP: Merl [place], MD   Recommendations at discharge:   Stop taking both diltiazem  and metoprolol  Resume amiodarone  when heart rate is consistently >60 Follow up with cardiology next week as scheduled Follow up with Dr. Joice Nares in 1-2 weeks  Discharge Diagnoses: Principal Problem:   Symptomatic bradycardia Active Problems:   Hypertension   BPH (benign prostatic hyperplasia)   Obesity   Gout   Depression   (HFpEF) heart failure with preserved ejection fraction (HCC)   CAD (coronary artery disease)   Persistent atrial fibrillation (HCC)   HLD (hyperlipidemia)   S/P TAVR (transcatheter aortic valve replacement)   Hospital Course: 89yo with h/o  CAD, afib on Eliquis , chronic HFpEF, AVR, HTN, and HLD who presented on 6/3 with dizziness and SOB.  HR found to have in the 40s, EKG with sinus bradycardia with first-degree AV block. Rate controlling medications held with improved HR into 50-60s range.  Cardiology reviewed and recommends stopping diltiazem  and metoprolol .  Patient is stable for outpatient f/u per cardiology.  Assessment and Plan:  Symptomatic bradycardia Patient has had chronic fatigue preventing full ability to participate in activities including significant exercise Symptoms were significantly worse yesterday after taking Diltiazem  and metoprolol  simultaneously (usually takes them spaced apart) He was found to have bradycardia into the 88s Consult to cardiology at patient's request Observed in progressive care, holding beta blocker and diltiazem  as well as amiodarone  Recommend stopping metoprolol  and diltiazem  to see if his overall deterioration improves  DC to home today   CAD/Afib/s/p TAVR Usually rate controlled with amiodarone , diltiazem , and metoprolol  Recommend stopping metoprolol ,  as above Continue Eliquis    Chronic HFpEF Appears compensated Continue spironolactone  He takes Lasix  3 times a week, timed around his schedule (when he knows he has 4 hours without activities scheduled and can be home to diurese)   HTN Continue diltiazem  Stop metoprolol  He may need an additional agent in the future but is currently at goal (126/66)   HLD Continue atorvastatin , fish oil   GERD Continue PPI   Gout Continue allopurinol    BPH Continue dutasteride , tamsulosin    Mood d/o Continue mirtazapine    Class 1 obesity Body mass index is 31.25 kg/m.Aaron Aas  Weight loss should be encouraged Outpatient PCP/bariatric medicine f/u encouraged Significantly low or high BMI is associated with higher medical risk including morbidity and mortality    DNR I have discussed code status with the patient and he would not desire resuscitation and would prefer to die a natural death should that situation arise. [facility] documents reviewed    Pain control -   Controlled Substance Reporting System database was reviewed. and patient was instructed, not to drive, operate heavy machinery, perform activities at heights, swimming or participation in water activities or provide baby-sitting services while on Pain, Sleep and Anxiety Medications; until their outpatient Physician has advised to do so again. Also recommended to not to take more than prescribed Pain, Sleep and Anxiety Medications.   Disposition: Home Diet recommendation:  Cardiac diet DISCHARGE MEDICATION: Allergies as of 04/17/2024   No Known Allergies      Medication List     PAUSE taking these medications    amiodarone  200 MG tablet Wait to take this until your doctor or other care provider tells you to start again. Commonly known as: PACERONE  Take 1 tablet (  200 mg total) by mouth daily.       STOP taking these medications    diltiazem  120 MG 24 hr capsule Commonly known as: CARDIZEM  CD   metoprolol   tartrate 25 MG tablet Commonly known as: LOPRESSOR        TAKE these medications    acetaminophen  500 MG tablet Commonly known as: TYLENOL  Take 1,000 mg by mouth as needed for moderate pain (pain score 4-6).   allopurinol  100 MG tablet Commonly known as: ZYLOPRIM  Take 2 tablets (200 mg total) by mouth daily.   atorvastatin  10 MG tablet Commonly known as: LIPITOR Take 1 tablet (10 mg total) by mouth every morning.   cyanocobalamin  1000 MCG tablet Commonly known as: VITAMIN B12 Take 1,000 mcg by mouth daily.   dutasteride  0.5 MG capsule Commonly known as: AVODART  Take 0.5 mg by mouth every morning.   Eliquis  5 MG Tabs tablet Generic drug: apixaban  TAKE 1 TABLET TWICE A DAY   Fish Oil 1200 MG Caps Take 1,200 mg by mouth daily.   furosemide  40 MG tablet Commonly known as: LASIX  TAKE 1 AND 1/2 TABLETS BY MOUTH FOUR times a WEEK. What changed: See the new instructions.   mirtazapine  30 MG tablet Commonly known as: REMERON  TAKE ONE TABLET AT BEDTIME.   multivitamin tablet Take 1 tablet by mouth daily.   pantoprazole  40 MG tablet Commonly known as: Protonix  Take 1 tablet (40 mg total) by mouth daily.   potassium chloride  SA 20 MEQ tablet Commonly known as: KLOR-CON  M TAKE 1 TABLET BY MOUTH IN THE MORNING, TAKE 1/2 TABLET BY MOUTH IN THE P.M.   PROBIOTIC DAILY PO Take 1 tablet by mouth daily.   pyridOXINE 100 MG tablet Commonly known as: VITAMIN B6 Take 100 mg by mouth daily.   spironolactone  25 MG tablet Commonly known as: ALDACTONE  Take 1 tablet (25 mg total) by mouth daily.   Systane 0.4-0.3 % Soln Generic drug: Polyethyl Glycol-Propyl Glycol Place 1 drop into both eyes 2 (two) times daily.   tamsulosin  0.4 MG Caps capsule Commonly known as: FLOMAX  Take 0.4 mg by mouth daily after breakfast.   Turmeric 500 MG Caps Take 500 mg by mouth daily.        Discharge Exam: Filed Weights   04/17/24 0136  Weight: 98.8 kg   See H&P from earlier  today  Condition at discharge: stable  The results of significant diagnostics from this hospitalization (including imaging, microbiology, ancillary and laboratory) are listed below for reference.   Imaging Studies: DG Chest 2 View Result Date: 04/16/2024 CLINICAL DATA:  Chest pain EXAM: CHEST - 2 VIEW COMPARISON:  Chest x-ray 02/26/2024 FINDINGS: The heart is enlarged. Patient is status post TAVR. There is central pulmonary vascular congestion. There is no focal lung consolidation, pleural effusion or pneumothorax. There is dextroconvex curvature of the thoracic spine. IMPRESSION: Cardiomegaly with central pulmonary vascular congestion. Electronically Signed   By: Tyron Gallon M.D.   On: 04/16/2024 18:15    Microbiology: Results for orders placed or performed during the hospital encounter of 08/23/22  Surgical PCR screen     Status: None   Collection Time: 08/24/22  6:14 AM   Specimen: Nasal Mucosa; Nasal Swab  Result Value Ref Range Status   MRSA, PCR NEGATIVE NEGATIVE Final   Staphylococcus aureus NEGATIVE NEGATIVE Final    Comment: (NOTE) The Xpert SA Assay (FDA approved for NASAL specimens in patients 43 years of age and older), is one component of a comprehensive surveillance program.  It is not intended to diagnose infection nor to guide or monitor treatment. Performed at [facility], [address] [place], [place] [address]     Labs: CBC: Recent Labs  Lab 04/16/24 1628 04/17/24 0312  WBC 7.3 6.0  NEUTROABS  --  3.7  HGB 14.7 14.0  HCT 44.5 42.1  MCV 103.2* 101.7*  PLT 185 180   Basic Metabolic Panel: Recent Labs  Lab 04/16/24 1628 04/17/24 0312  NA 135 139  K 5.3* 5.0  CL 98 104  CO2 24 27  GLUCOSE 113* 98  BUN 27* 23  CREATININE 1.11 1.06  CALCIUM  9.8 9.3  MG  --  2.2   Liver Function Tests: Recent Labs  Lab 04/17/24 0312  AST 24  ALT 30  ALKPHOS 48  BILITOT 1.3*  PROT 6.2*  ALBUMIN 3.5   CBG: No results for input(s):  "GLUCAP" in the last 168 hours.  Discharge time spent: less than 30 minutes.  Signed: Lorita Rosa, MD [facility] 04/17/2024

## 2024-04-17 NOTE — Progress Notes (Signed)
 Cardiology messaged concerning bradycardia  Noted HR in 40s-50s.  Some fatigue.   Makes sense to stop low dose metoprolol  and diltiazem . Continue amiodarone . Hasn't had Afib in a couple of years  Can follow up with cardiology as outpatient. Does not require inpatient evaluation  Briggs Edelen [place] MD, [facility]

## 2024-04-17 NOTE — Care Management Obs Status (Signed)
 MEDICARE OBSERVATION STATUS NOTIFICATION   Patient Details  Name: Patrick Franco MRN: [id number] Date of Birth: 1932/06/13   Medicare Observation Status Notification Given:  Yes    Janith Melnick 04/17/2024, 9:09 AM

## 2024-04-17 NOTE — H&P (Addendum)
 History and Physical    Patient: Patrick Franco Patrick Franco DOB: 1932/07/26 DOA: 04/16/2024 DOS: the patient was seen and examined on 04/17/2024 PCP: Merl [place], MD  Patient coming from: Home - lives with wife; Ray Caffey:  Wife, [phone]   Chief Complaint: dizziness  HPI: Patrick Franco is a 89 y.o. male with medical history significant of CAD, afib on Eliquis , chronic HFpEF, AVR, HTN, and HLD who presented on 6/3 with dizziness and SOB.  HR found to have in the 40s, EKG with sinus bradycardia with first-degree AV block.  On metoprolol  and diltiazem  at home, both hold.  CXR with cardiomegaly and pulmonary vascular congestion.   He reports that he has "been going downhill."  He has afib, heart valve replacement, diagnosed with CHF.  For the last few months, he has gotten weaker and weaker and neuropathy is affecting his balance.  He walks 20-30 yards and is gasping for breath, no stamina.  When his HR is 50 or below, his PCP is office is notified (based on his BP machine).  He was really struggling yesterday and the doctor called because his HR was low.  He is due to see Dr. Clydie Darter next week.  He has declined precipitously in the last 6 months. He usually spaces out Dilt and metoprolol  but he took both yesterday.  He lives at [facility] and he works out with a Psychologist, educational multiple times a week.    ER Course:  Symptomatic bradycardia on 2 rate-controlling agents.  Hold and monitor.     Review of Systems: As mentioned in the history of present illness. All other systems reviewed and are negative. Past Medical History:  Diagnosis Date   Aortic stenosis    Arthritis    Atrial fibrillation (HCC)    BPH (benign prostatic hyperplasia)    Chronic low back pain    Depression    Diverticulitis    Dyspnea    Elevated PSA    Foot drop, right    Gait disorder 02/03/2015   Hip pain    History of colonoscopy    History of nuclear stress test    Myoview  10/22: EF 67, no ischemia or infarction; low risk    HOH (hard of hearing)    hearing aid   Hypertension    Lumbar radiculopathy    Mitral valve prolapse    Onychomycosis    Peptic ulcer disease    S/P TAVR (transcatheter aortic valve replacement) 07/26/2022   26mm S3UR via TF approach with Dr. Abel Hoe and Dr. Danley Dusky   Sepsis [facility]) 2005   e. coli-after prostate bx   Sleep apnea    Torn ACL    Tracheobronchomalacia    seen on 2019 Chest CT   Past Surgical History:  Procedure Laterality Date   BIOPSY  10/05/2021   Procedure: BIOPSY;  Surgeon: Genell Ken, MD;  Location: [facility] ENDOSCOPY;  Service: Gastroenterology;;   CARDIAC CATHETERIZATION     CARDIOVERSION N/A 03/27/2020   Procedure: CARDIOVERSION;  Surgeon: Maudine Sos, MD;  Location: [facility] ENDOSCOPY;  Service: Cardiovascular;  Laterality: N/A;   CATARACT EXTRACTION Bilateral    ESOPHAGOGASTRODUODENOSCOPY N/A 08/19/2015   Procedure: ESOPHAGOGASTRODUODENOSCOPY (EGD);  Surgeon: Jolinda Necessary, MD;  Location: [facility] ENDOSCOPY;  Service: Endoscopy;  Laterality: N/A;   ESOPHAGOGASTRODUODENOSCOPY N/A 10/05/2021   Procedure: ESOPHAGOGASTRODUODENOSCOPY (EGD);  Surgeon: Genell Ken, MD;  Location: [facility] ENDOSCOPY;  Service: Gastroenterology;  Laterality: N/A;   INTRAOPERATIVE TRANSTHORACIC ECHOCARDIOGRAM N/A 07/26/2022   Procedure: INTRAOPERATIVE TRANSTHORACIC ECHOCARDIOGRAM;  Surgeon: Antoinette Batman  D, MD;  Location: [facility] OR;  Service: Open Heart Surgery;  Laterality: N/A;   right knee replacement Right 1996   RIGHT/LEFT HEART CATH AND CORONARY ANGIOGRAPHY N/A 05/26/2022   Procedure: RIGHT/LEFT HEART CATH AND CORONARY ANGIOGRAPHY;  Surgeon: Odie Benne, MD;  Location: [facility] INVASIVE CV LAB;  Service: Cardiovascular;  Laterality: N/A;   SHOULDER SURGERY     TEE WITHOUT CARDIOVERSION N/A 03/27/2020   Procedure: TRANSESOPHAGEAL ECHOCARDIOGRAM (TEE);  Surgeon: Maudine Sos, MD;  Location: [facility] ENDOSCOPY;  Service: Cardiovascular;  Laterality: N/A;   TONSILLECTOMY      TOTAL HIP ARTHROPLASTY Right 08/13/2019   Procedure: RIGHT TOTAL HIP ARTHROPLASTY ANTERIOR APPROACH;  Surgeon: Arnie Lao, MD;  Location: [facility] OR;  Service: Orthopedics;  Laterality: Right;   TRANSCATHETER AORTIC VALVE REPLACEMENT, TRANSFEMORAL N/A 07/26/2022   Procedure: Transcatheter Aortic Valve Replacement, Transfemoral using a 26 MM Edwards SAPIEN 3 Ultra;  Surgeon: Odie Benne, MD;  Location: [facility] OR;  Service: Open Heart Surgery;  Laterality: N/A;  Transfemoral approach   UMBILICAL HERNIA REPAIR N/A 08/25/2022   Procedure: UMBILICAL HERNIA REPAIR WITH POSSIBLE MESH;  Surgeon: Caralyn Chandler, MD;  Location: [facility] ORS;  Service: General;  Laterality: N/A;   Social History:  reports that he quit smoking about 64 years ago. His smoking use included cigarettes. He started smoking about 65 years ago. He has a 0.5 pack-year smoking history. He has never used smokeless tobacco. He reports current alcohol  use of about 14.0 standard drinks of alcohol  per week. He reports that he does not use drugs.  No Known Allergies  Family History  Problem Relation Age of Onset   Pneumonia Mother 61   Cancer - Lung Father 39   Multiple myeloma Brother 14    Prior to Admission medications   Medication Sig Start Date End Date Taking? Authorizing Provider  acetaminophen  (TYLENOL ) 500 MG tablet Take 1,000 mg by mouth as needed for moderate pain (pain score 4-6).    [provider]  allopurinol  (ZYLOPRIM ) 100 MG tablet Take 2 tablets (200 mg total) by mouth daily. 12/29/21   Asa Lauth, NP  amiodarone  (PACERONE ) 200 MG tablet Take 1 tablet (200 mg total) by mouth daily. 07/21/23   Ardia Kraft, PA-C  amoxicillin  (AMOXIL ) 500 MG tablet Take 4 tablets (2,000 mg total) by mouth as directed. 1 hour prior to dental work including cleanings 08/05/22   Ardia Kraft, PA-C  atorvastatin  (LIPITOR) 10 MG tablet Take 1 tablet (10 mg total) by mouth every morning. 04/16/24   Odie Benne, MD  cyanocobalamin  (VITAMIN B12) 1000 MCG tablet Take 1,000 mcg by mouth daily.    [provider]  diltiazem  (CARDIZEM  CD) 120 MG 24 hr capsule Take 1 capsule (120 mg total) by mouth daily. 12/07/23   Fenton, Clint R, PA  dutasteride  (AVODART ) 0.5 MG capsule Take 0.5 mg by mouth every morning. 01/06/15   [provider]  ELIQUIS  5 MG TABS tablet TAKE 1 TABLET TWICE A DAY 06/01/23   Odie Benne, MD  furosemide  (LASIX ) 40 MG tablet TAKE 1 AND 1/2 TABLETS BY MOUTH FOUR times a WEEK. 04/10/24   Odie Benne, MD  metoprolol  tartrate (LOPRESSOR ) 25 MG tablet Take 1 tablet (25 mg total) by mouth daily. 04/10/24   Odie Benne, MD  mirtazapine  (REMERON ) 30 MG tablet TAKE ONE TABLET AT BEDTIME. 08/03/20   Smith, Zachary M, DO  Multiple Vitamin (MULTIVITAMIN) capsule Take 1 capsule by mouth daily.  Citroflex-    [provider]  Multiple Vitamin (MULTIVITAMIN) tablet Take 1 tablet by mouth daily.    [provider]  nitrofurantoin , macrocrystal-monohydrate, (MACROBID ) 100 MG capsule Take 1 capsule (100 mg total) by mouth every 12 (twelve) hours. 12/09/23   McKenzie, Arden Beck, MD  Omega-3 Fatty Acids (FISH OIL) 1200 MG CAPS Take 1,200 mg by mouth daily.    [provider]  ondansetron  (ZOFRAN -ODT) 4 MG disintegrating tablet 4mg  ODT q4 hours prn nausea/vomit 02/22/24   Floyd, Dan, DO  pantoprazole  (PROTONIX ) 40 MG tablet Take 1 tablet (40 mg total) by mouth daily. 12/29/21 12/07/23  Asa Lauth, NP  Polyethyl Glycol-Propyl Glycol (SYSTANE) 0.4-0.3 % SOLN Place 1 drop into both eyes 2 (two) times daily.    [provider]  potassium chloride  SA (KLOR-CON  M) 20 MEQ tablet TAKE 1 TABLET BY MOUTH IN THE MORNING, TAKE 1/2 TABLET BY MOUTH IN THE P.M. 03/19/24   Marlyse Single T, PA-C  Probiotic Product (PROBIOTIC DAILY PO) Take 1 tablet by mouth daily.    [provider]  pyridOXINE (VITAMIN B-6) 100 MG tablet  Take 100 mg by mouth daily.    [provider]  spironolactone  (ALDACTONE ) 25 MG tablet Take 1 tablet (25 mg total) by mouth daily. 04/16/24   Odie Benne, MD  tamsulosin  (FLOMAX ) 0.4 MG CAPS capsule Take 0.4 mg by mouth daily after breakfast. 01/06/15   [provider]  Turmeric 500 MG CAPS Take 500 mg by mouth daily.    [provider]    Physical Exam: Vitals:   04/17/24 0136 04/17/24 0309 04/17/24 0748 04/17/24 1114  BP: 136/78 134/79 139/78 126/66  Pulse: (!) 59 (!) 58 60 (!) 55  Resp: 17 20 18 16   Temp: 98 F (36.7 C) 98.5 F (36.9 C) 98.1 F (36.7 C) 97.8 F (36.6 C)  TempSrc: Oral Oral Oral Oral  SpO2: 97% 97% 95% 92%  Weight: 98.8 kg     Height: 5\' 10"  (1.778 m)      General:  Appears calm and comfortable and is in NAD Eyes:   normal lids, iris ENT:  grossly normal hearing, lips & tongue, mmm Cardiovascular:  Bradycardia in 60 range.  Tr LE edema.  Respiratory:   CTA bilaterally with no wheezes/rales/rhonchi.  Normal respiratory effort. Abdomen:  soft, NT, ND Skin:  no rash or induration seen on limited exam Musculoskeletal:  grossly normal tone BUE/BLE, good ROM, no bony abnormality Psychiatric:  grossly normal mood and affect, speech fluent and appropriate, AOx3 Neurologic:  CN 2-12 grossly intact, moves all extremities in coordinated fashion   Radiological Exams on Admission: Independently reviewed - see discussion in A/P where applicable  DG Chest 2 View Result Date: 04/16/2024 CLINICAL DATA:  Chest pain EXAM: CHEST - 2 VIEW COMPARISON:  Chest x-ray 02/26/2024 FINDINGS: The heart is enlarged. Patient is status post TAVR. There is central pulmonary vascular congestion. There is no focal lung consolidation, pleural effusion or pneumothorax. There is dextroconvex curvature of the thoracic spine. IMPRESSION: Cardiomegaly with central pulmonary vascular congestion. Electronically Signed   By: Tyron Gallon M.D.   On: 04/16/2024 18:15     EKG: Independently reviewed.  Sinus bradycardia with rate 45; first degree AV block; no evidence of acute ischemia   Labs on Admission: I have personally reviewed the available labs and imaging studies at the time of the admission.  Pertinent labs:    Normal BMP Bili 1.3, stable Unremarkable CBC  Assessment and Plan: Principal Problem:   Symptomatic bradycardia Active Problems:   Hypertension   BPH (benign prostatic hyperplasia)   Obesity   Gout   Depression   (HFpEF) heart failure with preserved ejection fraction (HCC)   CAD (coronary artery disease)   Persistent atrial fibrillation (HCC)   HLD (hyperlipidemia)   S/P TAVR (transcatheter aortic valve replacement)    Symptomatic bradycardia Patient has had chronic fatigue preventing full ability to participate in activities including significant exercise Symptoms were significantly worse yesterday after taking Diltiazem  and metoprolol  simultaneously (usually takes them spaced apart) He was found to have bradycardia into the 40s Consult to cardiology at patient's request Will observe in progressive care, holding beta blocker and diltiazem  Recommend stopping metoprolol  to see if his overall deterioration improves  Anticipate d/c to home later today  CAD/Afib/s/p TAVR Usually rate controlled with amiodarone , diltiazem , and metoprolol  Recommend stopping metoprolol , as above Continue Eliquis   Chronic HFpEF Appears compensated Continue spironolactone  He takes Lasix  3 times a week, timed around his schedule (when he knows he has 4 hours without activities scheduled and can be home to diurese)  HTN Continue diltiazem  Stop metoprolol  He may need an additional agent in the future but is currently at goal (126/66)  HLD Continue atorvastatin , fish oil  GERD Continue PPI  Gout Continue allopurinol   BPH Continue dutasteride , tamsulosin   Mood d/o Continue mirtazapine   Class 1 obesity Body mass index is 31.25  kg/m.Aaron Aas  Weight loss should be encouraged Outpatient PCP/bariatric medicine f/u encouraged Significantly low or high BMI is associated with higher medical risk including morbidity and mortality   DNR I have discussed code status with the patient and he would not desire resuscitation and would prefer to die a natural death should that situation arise. [facility] documents reviewed Patient will need a gold out of facility DNR form at the time of discharge      Advance Care Planning:   Code Status: Limited: Do not attempt resuscitation (DNR) -DNR-LIMITED -Do Not Intubate/DNI    Consults: Cardiology  DVT Prophylaxis: Eliquis   Family Communication: None present  Severity of Illness: The appropriate patient status for this patient is OBSERVATION. Observation status is judged to be reasonable and necessary in order to provide the required intensity of service to ensure the patient's safety. The patient's presenting symptoms, physical exam findings, and initial radiographic and laboratory data in the context of their medical condition is felt to place them at decreased risk for further clinical deterioration. Furthermore, it is anticipated that the patient will be medically stable for discharge from the hospital within 2 midnights of admission.   Author: Lorita Rosa, MD 04/17/2024 1:43 PM  For on call review [email or web address].

## 2024-04-24 ENCOUNTER — Ambulatory Visit: Admitting: Cardiovascular Disease

## 2024-04-24 DIAGNOSIS — R269 Unspecified abnormalities of gait and mobility: Secondary | ICD-10-CM | POA: Diagnosis not present

## 2024-04-24 DIAGNOSIS — M545 Low back pain, unspecified: Secondary | ICD-10-CM | POA: Diagnosis not present

## 2024-04-25 ENCOUNTER — Encounter: Payer: Self-pay | Admitting: Cardiovascular Disease

## 2024-04-25 ENCOUNTER — Ambulatory Visit: Attending: Cardiovascular Disease | Admitting: Cardiovascular Disease

## 2024-04-25 VITALS — BP 146/68 | HR 86 | Ht 70.0 in | Wt 218.0 lb

## 2024-04-25 DIAGNOSIS — I480 Paroxysmal atrial fibrillation: Secondary | ICD-10-CM | POA: Insufficient documentation

## 2024-04-25 NOTE — Patient Instructions (Signed)
 Medication Instructions:  Your physician recommends that you continue on your current medications as directed. Please refer to the Current Medication list given to you today. *If you need a refill on your cardiac medications before your next appointment, please call your pharmacy*   Follow-Up: At [facility], you and your health needs are our priority.  As part of our continuing mission to provide you with exceptional heart care, our providers are all part of one team.  This team includes your primary Cardiologist (physician) and Advanced Practice Providers or APPs (Physician Assistants and Nurse Practitioners) who all work together to provide you with the care you need, when you need it.  Your next appointment:   6 month(s)  Provider:   You will follow up in the Atrial Fibrillation Clinic located at [facility]. Your provider will be: Clint R. Fenton, PA-C or Minnie Amber, PA-C

## 2024-04-25 NOTE — Progress Notes (Signed)
  Electrophysiology Office Note:    Date:  04/25/2024   ID:  Patrick Franco, DOB 06-23-1932, MRN [id number]  PCP:  Merl Star, MD   [facility] Providers Cardiologist:  Antoinette Batman, MD Electrophysiologist:  Jolly Needle, MD (Inactive)     Referring MD: Merl Star, MD   History of Present Illness:    Patrick Franco is a 89 y.o. male with a medical history significant for atrial fibrillation, coronary disease history of hemorrhage with gastric ulcer, obstructive sleep apnea on CPAP referred for arrhythmia management.     The patient has a history of atrial fibrillation has been managed in the atrial fibrillation clinic.  He was taking Tikosyn  until an admission in October 2023 for small bowel obstruction secondary to an umbilical hernia.  Because he was n.p.o. and missed several doses of Tikosyn , decision was made to restart amiodarone  rather than reload Tikosyn .  Since, he has maintained sinus rhythm.  He was admitted in June 2025 due to fatigue and bradycardia.  Diltiazem  and metoprolol  were discontinued.  His heart rate normalized.  Amiodarone  was also held on discharge.         Today, he reports that he is at baseline. He is often fatigued; this is a consistent issue and occurs even when his  EKGs/Labs/Other Studies Reviewed Today:     Echocardiogram:  TTE September 2024 Reviewed in epic    EKG:   EKG Interpretation Date/Time:  Thursday April 25 2024 14:03:21 EDT Ventricular Rate:  85 PR Interval:  314 QRS Duration:  100 QT Interval:  358 QTC Calculation: 426 R Axis:   -35  Text Interpretation: Sinus rhythm with 1st degree A-V block Left axis deviation Anterior infarct (cited on or before 07-Dec-2023) When compared with ECG of 17-Apr-2024 10:06, No significant change was found Confirmed by Marlane Silver [phone]) on 04/25/2024 2:09:53 PM     Physical Exam:    VS:  BP (!) 146/68   Pulse 86   Ht 5' 10 (1.778 m)   Wt 218 lb (98.9 kg)   SpO2  98%   BMI 31.28 kg/m     Wt Readings from Last 3 Encounters:  04/25/24 218 lb (98.9 kg)  04/17/24 217 lb 13 oz (98.8 kg)  01/01/24 214 lb 9.6 oz (97.3 kg)     GEN: Well nourished, well developed in no acute distress CARDIAC: RRR, no murmurs, rubs, gallops RESPIRATORY:  Normal work of breathing MUSCULOSKELETAL: no edema    ASSESSMENT & PLAN:     Persistent atrial fibrillation Minimally symptomatic Rhythm is controlled with amiodarone , which he is tolerating well I reviewed labs from June 4 and TSH from January, chest x-ray June 3 Continue amiodarone  200 mg daily  Secondary hypercoagulable state CHA2DS2-VASc score is 5  TAVR September 2023 Normal function on echocardiogram September 2024  CHF with preserved EF Appears euvolemic and compensated  Sinus bradycardia Resolved off of diltiazem  and metoprolol     Signed, Efraim Grange, MD  04/25/2024 2:28 PM    [facility]

## 2024-04-29 DIAGNOSIS — R001 Bradycardia, unspecified: Secondary | ICD-10-CM | POA: Diagnosis not present

## 2024-04-29 DIAGNOSIS — R300 Dysuria: Secondary | ICD-10-CM | POA: Diagnosis not present

## 2024-04-29 DIAGNOSIS — R269 Unspecified abnormalities of gait and mobility: Secondary | ICD-10-CM | POA: Diagnosis not present

## 2024-05-01 DIAGNOSIS — L853 Xerosis cutis: Secondary | ICD-10-CM | POA: Diagnosis not present

## 2024-05-01 DIAGNOSIS — D225 Melanocytic nevi of trunk: Secondary | ICD-10-CM | POA: Diagnosis not present

## 2024-05-01 DIAGNOSIS — L570 Actinic keratosis: Secondary | ICD-10-CM | POA: Diagnosis not present

## 2024-05-01 DIAGNOSIS — D1801 Hemangioma of skin and subcutaneous tissue: Secondary | ICD-10-CM | POA: Diagnosis not present

## 2024-05-01 DIAGNOSIS — L821 Other seborrheic keratosis: Secondary | ICD-10-CM | POA: Diagnosis not present

## 2024-05-01 DIAGNOSIS — Z85828 Personal history of other malignant neoplasm of skin: Secondary | ICD-10-CM | POA: Diagnosis not present

## 2024-05-01 DIAGNOSIS — L858 Other specified epidermal thickening: Secondary | ICD-10-CM | POA: Diagnosis not present

## 2024-05-01 DIAGNOSIS — R269 Unspecified abnormalities of gait and mobility: Secondary | ICD-10-CM | POA: Diagnosis not present

## 2024-05-01 DIAGNOSIS — M545 Low back pain, unspecified: Secondary | ICD-10-CM | POA: Diagnosis not present

## 2024-05-01 DIAGNOSIS — D692 Other nonthrombocytopenic purpura: Secondary | ICD-10-CM | POA: Diagnosis not present

## 2024-05-06 DIAGNOSIS — I700 Atherosclerosis of aorta: Secondary | ICD-10-CM | POA: Diagnosis not present

## 2024-05-06 DIAGNOSIS — I480 Paroxysmal atrial fibrillation: Secondary | ICD-10-CM | POA: Diagnosis not present

## 2024-05-06 DIAGNOSIS — I10 Essential (primary) hypertension: Secondary | ICD-10-CM | POA: Diagnosis not present

## 2024-05-06 DIAGNOSIS — M545 Low back pain, unspecified: Secondary | ICD-10-CM | POA: Diagnosis not present

## 2024-05-06 DIAGNOSIS — R269 Unspecified abnormalities of gait and mobility: Secondary | ICD-10-CM | POA: Diagnosis not present

## 2024-05-06 DIAGNOSIS — I5030 Unspecified diastolic (congestive) heart failure: Secondary | ICD-10-CM | POA: Diagnosis not present

## 2024-05-08 DIAGNOSIS — R269 Unspecified abnormalities of gait and mobility: Secondary | ICD-10-CM | POA: Diagnosis not present

## 2024-05-08 DIAGNOSIS — M545 Low back pain, unspecified: Secondary | ICD-10-CM | POA: Diagnosis not present

## 2024-05-13 DIAGNOSIS — I5030 Unspecified diastolic (congestive) heart failure: Secondary | ICD-10-CM | POA: Diagnosis not present

## 2024-05-13 DIAGNOSIS — I700 Atherosclerosis of aorta: Secondary | ICD-10-CM | POA: Diagnosis not present

## 2024-05-13 DIAGNOSIS — M545 Low back pain, unspecified: Secondary | ICD-10-CM | POA: Diagnosis not present

## 2024-05-13 DIAGNOSIS — R269 Unspecified abnormalities of gait and mobility: Secondary | ICD-10-CM | POA: Diagnosis not present

## 2024-05-13 DIAGNOSIS — I480 Paroxysmal atrial fibrillation: Secondary | ICD-10-CM | POA: Diagnosis not present

## 2024-05-13 DIAGNOSIS — I10 Essential (primary) hypertension: Secondary | ICD-10-CM | POA: Diagnosis not present

## 2024-05-15 DIAGNOSIS — R269 Unspecified abnormalities of gait and mobility: Secondary | ICD-10-CM | POA: Diagnosis not present

## 2024-05-15 DIAGNOSIS — M545 Low back pain, unspecified: Secondary | ICD-10-CM | POA: Diagnosis not present

## 2024-05-20 DIAGNOSIS — M545 Low back pain, unspecified: Secondary | ICD-10-CM | POA: Diagnosis not present

## 2024-05-20 DIAGNOSIS — R269 Unspecified abnormalities of gait and mobility: Secondary | ICD-10-CM | POA: Diagnosis not present

## 2024-05-22 DIAGNOSIS — R269 Unspecified abnormalities of gait and mobility: Secondary | ICD-10-CM | POA: Diagnosis not present

## 2024-05-22 DIAGNOSIS — M545 Low back pain, unspecified: Secondary | ICD-10-CM | POA: Diagnosis not present

## 2024-05-23 DIAGNOSIS — N3020 Other chronic cystitis without hematuria: Secondary | ICD-10-CM | POA: Diagnosis not present

## 2024-05-23 DIAGNOSIS — N411 Chronic prostatitis: Secondary | ICD-10-CM | POA: Diagnosis not present

## 2024-05-28 DIAGNOSIS — M545 Low back pain, unspecified: Secondary | ICD-10-CM | POA: Diagnosis not present

## 2024-05-28 DIAGNOSIS — R269 Unspecified abnormalities of gait and mobility: Secondary | ICD-10-CM | POA: Diagnosis not present

## 2024-05-30 DIAGNOSIS — R269 Unspecified abnormalities of gait and mobility: Secondary | ICD-10-CM | POA: Diagnosis not present

## 2024-05-30 DIAGNOSIS — M545 Low back pain, unspecified: Secondary | ICD-10-CM | POA: Diagnosis not present

## 2024-06-06 ENCOUNTER — Ambulatory Visit (HOSPITAL_COMMUNITY): Payer: BLUE CROSS/BLUE SHIELD | Admitting: Physician Assistant

## 2024-06-08 DIAGNOSIS — I5030 Unspecified diastolic (congestive) heart failure: Secondary | ICD-10-CM | POA: Diagnosis not present

## 2024-06-08 DIAGNOSIS — I700 Atherosclerosis of aorta: Secondary | ICD-10-CM | POA: Diagnosis not present

## 2024-06-08 DIAGNOSIS — I10 Essential (primary) hypertension: Secondary | ICD-10-CM | POA: Diagnosis not present

## 2024-06-08 DIAGNOSIS — I480 Paroxysmal atrial fibrillation: Secondary | ICD-10-CM | POA: Diagnosis not present

## 2024-06-12 ENCOUNTER — Other Ambulatory Visit: Payer: Self-pay | Admitting: Cardiovascular Disease

## 2024-06-12 DIAGNOSIS — I484 Atypical atrial flutter: Secondary | ICD-10-CM

## 2024-06-12 DIAGNOSIS — I480 Paroxysmal atrial fibrillation: Secondary | ICD-10-CM

## 2024-06-12 NOTE — Telephone Encounter (Signed)
 Eliquis  5mg  refill request received. Patient is 89 years old, weight-98.9kg, Crea-1.06 on 04/17/24, Diagnosis-Afib, and last seen by Dr. Nancey on 04/25/24. Dose is appropriate based on dosing criteria. Will send in refill to requested pharmacy.

## 2024-06-13 DIAGNOSIS — I2510 Atherosclerotic heart disease of native coronary artery without angina pectoris: Secondary | ICD-10-CM | POA: Diagnosis not present

## 2024-06-13 DIAGNOSIS — I700 Atherosclerosis of aorta: Secondary | ICD-10-CM | POA: Diagnosis not present

## 2024-06-13 DIAGNOSIS — I480 Paroxysmal atrial fibrillation: Secondary | ICD-10-CM | POA: Diagnosis not present

## 2024-06-13 DIAGNOSIS — I5030 Unspecified diastolic (congestive) heart failure: Secondary | ICD-10-CM | POA: Diagnosis not present

## 2024-06-13 DIAGNOSIS — I10 Essential (primary) hypertension: Secondary | ICD-10-CM | POA: Diagnosis not present

## 2024-06-14 ENCOUNTER — Encounter (HOSPITAL_COMMUNITY): Payer: Self-pay | Admitting: Physician Assistant

## 2024-06-14 ENCOUNTER — Ambulatory Visit (HOSPITAL_COMMUNITY)
Admission: RE | Admit: 2024-06-14 | Discharge: 2024-06-14 | Disposition: A | Discharge status: Home / Self Care | Source: Ambulatory Visit | Attending: Physician Assistant | Admitting: Physician Assistant

## 2024-06-14 VITALS — BP 162/80 | HR 74 | Ht 70.0 in | Wt 220.8 lb

## 2024-06-14 DIAGNOSIS — Z5181 Encounter for therapeutic drug level monitoring: Secondary | ICD-10-CM | POA: Diagnosis present

## 2024-06-14 DIAGNOSIS — I4819 Other persistent atrial fibrillation: Secondary | ICD-10-CM | POA: Insufficient documentation

## 2024-06-14 DIAGNOSIS — I10 Essential (primary) hypertension: Secondary | ICD-10-CM | POA: Diagnosis not present

## 2024-06-14 DIAGNOSIS — I480 Paroxysmal atrial fibrillation: Secondary | ICD-10-CM | POA: Diagnosis present

## 2024-06-14 DIAGNOSIS — Z79899 Other long term (current) drug therapy: Secondary | ICD-10-CM | POA: Insufficient documentation

## 2024-06-14 DIAGNOSIS — D6869 Other thrombophilia: Secondary | ICD-10-CM | POA: Insufficient documentation

## 2024-06-14 MED ORDER — AMLODIPINE BESYLATE 2.5 MG PO TABS: 2.5000 mg | ORAL_TABLET | Freq: Every day | ORAL | 3 refills | Status: AC

## 2024-06-14 NOTE — Progress Notes (Addendum)
 Primary Care Physician: Rexanne Ingle, MD Referring Physician: Glendia Ferrier, PA Cardiologist: Dr Verlin EP: Dr Nancey   Patrick Franco is a 89 y.o. male with a h/o HTN, previous gastric ulcer with hemorrhage, moderate aortic stenosis, h/o CHF, CAD, afib on Tikosyn . He is in the afib clinic as he had a spell of dyspnea at bedtime last night and called EMS. He had just went to bed and was putting  on his CPAP and felt winded. He sat up and tried CPAP again and became short of breath again told his wife to call EMS. The fire department was there in a few minutes when he was still feeling short of breath and placed on monitor which showed SR. EMS arrived, EKG was done and it showed SR as well. His V/S were normal. It was not advised for him to go to the ER. He immediately felt better.  He  states that he may have panicked a little last night.  Episode lasted 25-30 mins. He also states that he has not felt well for the last few months. His PCP called this am to tell him that his HGB has dropped to 10.3 from 14.9 10/22 and is trying to get GI appointment asap. He has noted black stools.  He had a stress test 10/6 which was low risk and an echo in September did show progression of aortic stenosis from mild to moderate.   He is in SR today with stable BP. His weight today is 208 lbs and it was 218 lbs in September.    F/u in afib clinic, 12/29/21. He is staying in SR with stable qt. He did have a GI w/u last fall and was found to have a gastric ulcer. He was placed on a PPI, and oral iron and now CBC has returned to normal. He has not had any afib. No further dyspnea spells as described above.   F/u in afib clinic, 07/12/22. He is getting ready to have Aortic stenosis addressed, pending TAVR,  he is seeing Dr. Lucas 9/13. He is in SR with bigeminal PVC's today. Tikosyn  labs are pending. He takes his metoprolol  and his spironolactone  at noon and feels wiped out after that. His BP is soft today at 104/56. He  has had PVC's in the past but I have not seen bigeminal PVC's. Qt stable at 480 ms. He is unaware.    Follow up in the AF clinic 09/08/22. Patient underwent TAVR on 07/26/22. He presented to the ED 08/23/22 with abdominal pain and was found to have a small bowel obstruction secondary to an umbilical hernia. He underwent an umbilical hernia repair 08/25/22. His dofetilide  was held during admission as he was NPO. Decision was made with EP team to discontinue dofetilide  and start amiodarone . He feels well today and remains in SR.  Follow up in the AF clinic 12/07/22. Patient reports that he has done well since his last visit. He remains very active, going to cardiac rehab 3 days weekly and exercising on his own 2-3 other days. He denies any bleeding issues on anticoagulation.   Follow up in the AF clinic 12/07/23. Patient returns for follow up for atrial fibrillation. He states that he has done well from an afib standpoint. He remains very active by working out with a Psychologist, educational, riding a stationary bike, and walking in a pool. He is limited by his chronic back pain, followed by orthopedics. No bleeding issues on anticoagulation.   Follow up 06/14/24. Patient returns  for follow up for atrial fibrillation and amiodarone  monitoring. He was hospitalized for bradycardia 04/2024 and his diltiazem  and metoprolol  were discontinued. He is in SR today and feels well. No bleeding issues on anticoagulation.   Today, he  denies symptoms of palpitations, chest pain, orthopnea, PND, lower extremity edema, dizziness, presyncope, syncope, snoring, daytime somnolence, bleeding, or neurologic sequela. The patient is tolerating medications without difficulties and is otherwise without complaint today.    Past Medical History:  Diagnosis Date   Aortic stenosis    Arthritis    Atrial fibrillation (HCC)    BPH (benign prostatic hyperplasia)    Chronic low back pain    Depression    Diverticulitis    Dyspnea    Elevated PSA     Foot drop, right    Gait disorder 02/03/2015   Hip pain    History of colonoscopy    History of nuclear stress test    Myoview  10/22: EF 67, no ischemia or infarction; low risk   HOH (hard of hearing)    hearing aid   Hypertension    Lumbar radiculopathy    Mitral valve prolapse    Onychomycosis    Peptic ulcer disease    S/P TAVR (transcatheter aortic valve replacement) 07/26/2022   26mm S3UR via TF approach with Dr. Verlin and Dr. Hunt   Sepsis [facility]) 2005   e. coli-after prostate bx   Sleep apnea    Torn ACL    Tracheobronchomalacia    seen on 2019 Chest CT    Current Outpatient Medications  Medication Sig Dispense Refill   acetaminophen  (TYLENOL ) 500 MG tablet Take 1,000 mg by mouth as needed for moderate pain (pain score 4-6).     allopurinol  (ZYLOPRIM ) 100 MG tablet Take 2 tablets (200 mg total) by mouth daily.     amiodarone  (PACERONE ) 200 MG tablet Take 1 tablet (200 mg total) by mouth daily. 90 tablet 3   apixaban  (ELIQUIS ) 5 MG TABS tablet TAKE 1 TABLET TWICE A DAY 180 tablet 2   atorvastatin  (LIPITOR) 10 MG tablet Take 1 tablet (10 mg total) by mouth every morning. 90 tablet 2   cyanocobalamin  (VITAMIN B12) 1000 MCG tablet Take 1,000 mcg by mouth daily.     dutasteride  (AVODART ) 0.5 MG capsule Take 0.5 mg by mouth every morning.  2   furosemide  (LASIX ) 40 MG tablet TAKE 1 AND 1/2 TABLETS BY MOUTH FOUR times a WEEK. 72 tablet 2   mirtazapine  (REMERON ) 30 MG tablet TAKE ONE TABLET AT BEDTIME. 90 tablet 0   Multiple Vitamin (MULTIVITAMIN) tablet Take 1 tablet by mouth daily.     Omega-3 Fatty Acids (FISH OIL) 1200 MG CAPS Take 1,200 mg by mouth daily.     pantoprazole  (PROTONIX ) 40 MG tablet Take 1 tablet (40 mg total) by mouth daily.     Polyethyl Glycol-Propyl Glycol (SYSTANE) 0.4-0.3 % SOLN Place 1 drop into both eyes 2 (two) times daily.     potassium chloride  SA (KLOR-CON  M) 20 MEQ tablet TAKE 1 TABLET BY MOUTH IN THE MORNING, TAKE 1/2 TABLET BY MOUTH IN  THE P.M. 135 tablet 2   Probiotic Product (PROBIOTIC DAILY PO) Take 1 tablet by mouth daily.     pyridOXINE  (VITAMIN B-6) 100 MG tablet Take 100 mg by mouth daily.     spironolactone  (ALDACTONE ) 25 MG tablet Take 1 tablet (25 mg total) by mouth daily. 90 tablet 2   tamsulosin  (FLOMAX ) 0.4 MG CAPS capsule Take 0.4 mg by  mouth daily after breakfast.  2   Turmeric 500 MG CAPS Take 500 mg by mouth daily.     No current facility-administered medications for this encounter.    ROS- All systems are reviewed and negative except as per the HPI above  Physical Exam: Vitals:   06/14/24 0848  BP: (!) 162/80  Pulse: 74  Weight: 100.2 kg  Height: 5' 10 (1.778 m)     Wt Readings from Last 3 Encounters:  06/14/24 100.2 kg  04/25/24 98.9 kg  04/17/24 98.8 kg    GEN: Well nourished, well developed in no acute distress NECK: No JVD; No carotid bruits CARDIAC: Regular rate and rhythm, no rubs, gallops, 2/6 systolic murmur  RESPIRATORY:  Clear to auscultation without rales, wheezing or rhonchi  ABDOMEN: Soft, non-tender, non-distended EXTREMITIES:  trace bilateral edema, No deformity     EKG today demonstrates SR, 1st degree AV block Vent. rate 74 BPM PR interval 322 ms QRS duration 98 ms QT/QTcB 394/437 ms   Echo 07/21/23  1. Left ventricular ejection fraction, by estimation, is 60 to 65%. The  left ventricle has normal function. The left ventricle has no regional  wall motion abnormalities. There is severe concentric left ventricular  hypertrophy. Left ventricular diastolic parameters are consistent with Grade II diastolic dysfunction (pseudonormalization). Elevated left ventricular end-diastolic pressure.   2. Right ventricular systolic function is normal. The right ventricular  size is normal. There is normal pulmonary artery systolic pressure. The  estimated right ventricular systolic pressure is 35.7 mmHg.   3. Left atrial size was severely dilated.   4. The mitral valve is  degenerative. Moderate mitral valve regurgitation.  No evidence of mitral stenosis. There is mild prolapse of both leaflets of  the mitral valve. Moderate mitral annular calcification.   5. The aortic valve has been repaired/replaced. Mild perivalvular Aortic  valve regurgitation. No aortic stenosis is present. There is a 26 mm  Ultra, stented (TAVR) valve present in the aortic position. Aortic valve  area, by VTI measures 2.10 cm. Aortic  valve mean gradient measures 14.0 mmHg. Aortic valve Vmax measures 2.76 m/s. DVI 0.51.   6. The inferior vena cava is normal in size with greater than 50%  respiratory variability, suggesting right atrial pressure of 3 mmHg   7. There is right bowing of the interatrial septum, suggestive of  elevated left atrial pressure. No atrial level shunt detected by color  flow Doppler.   8. Compared to study dated 09/28/2022, the mean TAVR gradient has  increased from to , DVI has decreased from 0.6 to 0.51,  perivalvular AI remains mild and MR is now moderate.   9. Cannot exclude a small PFO.    Epic records reviewed    CHA2DS2-VASc Score = 5  The patient's score is based upon: CHF History: 1 HTN History: 1 Diabetes History: 0 Stroke History: 0 Vascular Disease History: 1 Age Score: 2 Gender Score: 0       ASSESSMENT AND PLAN: Persistent Atrial Fibrillation (ICD10:  I48.19) The patient's CHA2DS2-VASc score is 5, indicating a 7.2% annual risk of stroke.   Patient appears to be maintaining SR Continue amiodarone  200 mg daily. Can consider decreasing to 100 mg at follow up if he continues to maintain SR. Continue Eliquis  5 mg BID Not on AV nodal agent due to significant bradycardia.   Secondary Hypercoagulable State (ICD10:  D68.69) The patient is at significant risk for stroke/thromboembolism based upon his CHA2DS2-VASc Score of 5.  Continue  Apixaban  (Eliquis ). No bleeding issues.   High Risk Medication Monitoring (ICD 10:  Z79.899) Intervals on ECG acceptable for amiodarone  monitoring. PR prolonged but stable.   VHD AS s/p TAVR 07/26/22 Followed by Dr Verlin  HTN Elevated today and at home.  Will start amlodipine 2.5 mg daily  CAD No anginal symptoms Followed by Dr Verlin   Follow up with Dr Verlin and Dr Nancey per recalls. AF clinic in one year.    Daril Kicks PA-C Afib Clinic [facility] [address] [place], [place] [address] [phone]

## 2024-06-17 DIAGNOSIS — M545 Low back pain, unspecified: Secondary | ICD-10-CM | POA: Diagnosis not present

## 2024-06-17 DIAGNOSIS — R269 Unspecified abnormalities of gait and mobility: Secondary | ICD-10-CM | POA: Diagnosis not present

## 2024-06-18 DIAGNOSIS — R399 Unspecified symptoms and signs involving the genitourinary system: Secondary | ICD-10-CM | POA: Diagnosis not present

## 2024-06-19 DIAGNOSIS — M545 Low back pain, unspecified: Secondary | ICD-10-CM | POA: Diagnosis not present

## 2024-06-19 DIAGNOSIS — R269 Unspecified abnormalities of gait and mobility: Secondary | ICD-10-CM | POA: Diagnosis not present

## 2024-06-24 DIAGNOSIS — R269 Unspecified abnormalities of gait and mobility: Secondary | ICD-10-CM | POA: Diagnosis not present

## 2024-06-24 DIAGNOSIS — M545 Low back pain, unspecified: Secondary | ICD-10-CM | POA: Diagnosis not present

## 2024-06-25 DIAGNOSIS — I10 Essential (primary) hypertension: Secondary | ICD-10-CM | POA: Diagnosis not present

## 2024-06-25 DIAGNOSIS — N390 Urinary tract infection, site not specified: Secondary | ICD-10-CM | POA: Diagnosis not present

## 2024-06-25 DIAGNOSIS — G4700 Insomnia, unspecified: Secondary | ICD-10-CM | POA: Diagnosis not present

## 2024-06-26 DIAGNOSIS — R269 Unspecified abnormalities of gait and mobility: Secondary | ICD-10-CM | POA: Diagnosis not present

## 2024-06-26 DIAGNOSIS — M545 Low back pain, unspecified: Secondary | ICD-10-CM | POA: Diagnosis not present

## 2024-07-03 DIAGNOSIS — M545 Low back pain, unspecified: Secondary | ICD-10-CM | POA: Diagnosis not present

## 2024-07-03 DIAGNOSIS — R269 Unspecified abnormalities of gait and mobility: Secondary | ICD-10-CM | POA: Diagnosis not present

## 2024-07-04 DIAGNOSIS — R399 Unspecified symptoms and signs involving the genitourinary system: Secondary | ICD-10-CM | POA: Diagnosis not present

## 2024-07-08 DIAGNOSIS — M545 Low back pain, unspecified: Secondary | ICD-10-CM | POA: Diagnosis not present

## 2024-07-08 DIAGNOSIS — R269 Unspecified abnormalities of gait and mobility: Secondary | ICD-10-CM | POA: Diagnosis not present

## 2024-07-08 DIAGNOSIS — I10 Essential (primary) hypertension: Secondary | ICD-10-CM | POA: Diagnosis not present

## 2024-07-08 DIAGNOSIS — I700 Atherosclerosis of aorta: Secondary | ICD-10-CM | POA: Diagnosis not present

## 2024-07-08 DIAGNOSIS — I5030 Unspecified diastolic (congestive) heart failure: Secondary | ICD-10-CM | POA: Diagnosis not present

## 2024-07-08 DIAGNOSIS — I480 Paroxysmal atrial fibrillation: Secondary | ICD-10-CM | POA: Diagnosis not present

## 2024-07-10 DIAGNOSIS — H3530 Unspecified macular degeneration: Secondary | ICD-10-CM | POA: Diagnosis not present

## 2024-07-10 DIAGNOSIS — M545 Low back pain, unspecified: Secondary | ICD-10-CM | POA: Diagnosis not present

## 2024-07-10 DIAGNOSIS — H04123 Dry eye syndrome of bilateral lacrimal glands: Secondary | ICD-10-CM | POA: Diagnosis not present

## 2024-07-10 DIAGNOSIS — H53143 Visual discomfort, bilateral: Secondary | ICD-10-CM | POA: Diagnosis not present

## 2024-07-10 DIAGNOSIS — H353131 Nonexudative age-related macular degeneration, bilateral, early dry stage: Secondary | ICD-10-CM | POA: Diagnosis not present

## 2024-07-10 DIAGNOSIS — R269 Unspecified abnormalities of gait and mobility: Secondary | ICD-10-CM | POA: Diagnosis not present

## 2024-07-14 DIAGNOSIS — I700 Atherosclerosis of aorta: Secondary | ICD-10-CM | POA: Diagnosis not present

## 2024-07-14 DIAGNOSIS — I5030 Unspecified diastolic (congestive) heart failure: Secondary | ICD-10-CM | POA: Diagnosis not present

## 2024-07-14 DIAGNOSIS — I2510 Atherosclerotic heart disease of native coronary artery without angina pectoris: Secondary | ICD-10-CM | POA: Diagnosis not present

## 2024-07-14 DIAGNOSIS — I480 Paroxysmal atrial fibrillation: Secondary | ICD-10-CM | POA: Diagnosis not present

## 2024-07-14 DIAGNOSIS — I10 Essential (primary) hypertension: Secondary | ICD-10-CM | POA: Diagnosis not present

## 2024-07-17 DIAGNOSIS — R269 Unspecified abnormalities of gait and mobility: Secondary | ICD-10-CM | POA: Diagnosis not present

## 2024-07-17 DIAGNOSIS — M545 Low back pain, unspecified: Secondary | ICD-10-CM | POA: Diagnosis not present

## 2024-07-22 DIAGNOSIS — M545 Low back pain, unspecified: Secondary | ICD-10-CM | POA: Diagnosis not present

## 2024-07-22 DIAGNOSIS — R269 Unspecified abnormalities of gait and mobility: Secondary | ICD-10-CM | POA: Diagnosis not present

## 2024-07-24 DIAGNOSIS — R269 Unspecified abnormalities of gait and mobility: Secondary | ICD-10-CM | POA: Diagnosis not present

## 2024-07-24 DIAGNOSIS — M545 Low back pain, unspecified: Secondary | ICD-10-CM | POA: Diagnosis not present

## 2024-07-26 DIAGNOSIS — R300 Dysuria: Secondary | ICD-10-CM | POA: Diagnosis not present

## 2024-07-29 DIAGNOSIS — M545 Low back pain, unspecified: Secondary | ICD-10-CM | POA: Diagnosis not present

## 2024-07-29 DIAGNOSIS — R269 Unspecified abnormalities of gait and mobility: Secondary | ICD-10-CM | POA: Diagnosis not present

## 2024-07-31 DIAGNOSIS — R269 Unspecified abnormalities of gait and mobility: Secondary | ICD-10-CM | POA: Diagnosis not present

## 2024-07-31 DIAGNOSIS — M545 Low back pain, unspecified: Secondary | ICD-10-CM | POA: Diagnosis not present

## 2024-08-05 DIAGNOSIS — M545 Low back pain, unspecified: Secondary | ICD-10-CM | POA: Diagnosis not present

## 2024-08-05 DIAGNOSIS — R269 Unspecified abnormalities of gait and mobility: Secondary | ICD-10-CM | POA: Diagnosis not present

## 2024-08-13 DIAGNOSIS — I10 Essential (primary) hypertension: Secondary | ICD-10-CM | POA: Diagnosis not present

## 2024-08-13 DIAGNOSIS — I700 Atherosclerosis of aorta: Secondary | ICD-10-CM | POA: Diagnosis not present

## 2024-08-13 DIAGNOSIS — I5030 Unspecified diastolic (congestive) heart failure: Secondary | ICD-10-CM | POA: Diagnosis not present

## 2024-08-13 DIAGNOSIS — I2510 Atherosclerotic heart disease of native coronary artery without angina pectoris: Secondary | ICD-10-CM | POA: Diagnosis not present

## 2024-08-13 DIAGNOSIS — I480 Paroxysmal atrial fibrillation: Secondary | ICD-10-CM | POA: Diagnosis not present

## 2024-08-19 DIAGNOSIS — M545 Low back pain, unspecified: Secondary | ICD-10-CM | POA: Diagnosis not present

## 2024-08-19 DIAGNOSIS — R269 Unspecified abnormalities of gait and mobility: Secondary | ICD-10-CM | POA: Diagnosis not present

## 2024-08-20 ENCOUNTER — Other Ambulatory Visit (HOSPITAL_BASED_OUTPATIENT_CLINIC_OR_DEPARTMENT_OTHER): Payer: Self-pay

## 2024-08-20 DIAGNOSIS — Z23 Encounter for immunization: Secondary | ICD-10-CM | POA: Diagnosis not present

## 2024-08-20 MED ORDER — FLUZONE HIGH-DOSE 0.5 ML IM SUSY
0.5000 mL | PREFILLED_SYRINGE | Freq: Once | INTRAMUSCULAR | 0 refills | Status: AC
  Filled 2024-08-20: qty 0.5, 1d supply, fill #0

## 2024-08-21 DIAGNOSIS — R269 Unspecified abnormalities of gait and mobility: Secondary | ICD-10-CM | POA: Diagnosis not present

## 2024-08-21 DIAGNOSIS — M545 Low back pain, unspecified: Secondary | ICD-10-CM | POA: Diagnosis not present

## 2024-08-26 DIAGNOSIS — I700 Atherosclerosis of aorta: Secondary | ICD-10-CM | POA: Diagnosis not present

## 2024-08-26 DIAGNOSIS — I10 Essential (primary) hypertension: Secondary | ICD-10-CM | POA: Diagnosis not present

## 2024-08-26 DIAGNOSIS — I5030 Unspecified diastolic (congestive) heart failure: Secondary | ICD-10-CM | POA: Diagnosis not present

## 2024-08-26 DIAGNOSIS — I480 Paroxysmal atrial fibrillation: Secondary | ICD-10-CM | POA: Diagnosis not present

## 2024-08-28 DIAGNOSIS — M545 Low back pain, unspecified: Secondary | ICD-10-CM | POA: Diagnosis not present

## 2024-08-28 DIAGNOSIS — R269 Unspecified abnormalities of gait and mobility: Secondary | ICD-10-CM | POA: Diagnosis not present

## 2024-08-29 DIAGNOSIS — R399 Unspecified symptoms and signs involving the genitourinary system: Secondary | ICD-10-CM | POA: Diagnosis not present

## 2024-09-02 DIAGNOSIS — M545 Low back pain, unspecified: Secondary | ICD-10-CM | POA: Diagnosis not present

## 2024-09-02 DIAGNOSIS — R269 Unspecified abnormalities of gait and mobility: Secondary | ICD-10-CM | POA: Diagnosis not present

## 2024-09-04 DIAGNOSIS — M545 Low back pain, unspecified: Secondary | ICD-10-CM | POA: Diagnosis not present

## 2024-09-04 DIAGNOSIS — R269 Unspecified abnormalities of gait and mobility: Secondary | ICD-10-CM | POA: Diagnosis not present

## 2024-09-09 DIAGNOSIS — M545 Low back pain, unspecified: Secondary | ICD-10-CM | POA: Diagnosis not present

## 2024-09-09 DIAGNOSIS — R269 Unspecified abnormalities of gait and mobility: Secondary | ICD-10-CM | POA: Diagnosis not present

## 2024-09-11 DIAGNOSIS — R269 Unspecified abnormalities of gait and mobility: Secondary | ICD-10-CM | POA: Diagnosis not present

## 2024-09-11 DIAGNOSIS — M545 Low back pain, unspecified: Secondary | ICD-10-CM | POA: Diagnosis not present

## 2024-09-13 DIAGNOSIS — I480 Paroxysmal atrial fibrillation: Secondary | ICD-10-CM | POA: Diagnosis not present

## 2024-09-13 DIAGNOSIS — I10 Essential (primary) hypertension: Secondary | ICD-10-CM | POA: Diagnosis not present

## 2024-09-13 DIAGNOSIS — I5030 Unspecified diastolic (congestive) heart failure: Secondary | ICD-10-CM | POA: Diagnosis not present

## 2024-09-13 DIAGNOSIS — I700 Atherosclerosis of aorta: Secondary | ICD-10-CM | POA: Diagnosis not present

## 2024-09-13 DIAGNOSIS — I2510 Atherosclerotic heart disease of native coronary artery without angina pectoris: Secondary | ICD-10-CM | POA: Diagnosis not present

## 2024-09-17 DIAGNOSIS — N3020 Other chronic cystitis without hematuria: Secondary | ICD-10-CM | POA: Diagnosis not present

## 2024-09-25 DIAGNOSIS — I10 Essential (primary) hypertension: Secondary | ICD-10-CM | POA: Diagnosis not present

## 2024-09-25 DIAGNOSIS — I700 Atherosclerosis of aorta: Secondary | ICD-10-CM | POA: Diagnosis not present

## 2024-09-25 DIAGNOSIS — I480 Paroxysmal atrial fibrillation: Secondary | ICD-10-CM | POA: Diagnosis not present

## 2024-09-25 DIAGNOSIS — I5030 Unspecified diastolic (congestive) heart failure: Secondary | ICD-10-CM | POA: Diagnosis not present

## 2024-10-13 DIAGNOSIS — I700 Atherosclerosis of aorta: Secondary | ICD-10-CM | POA: Diagnosis not present

## 2024-10-13 DIAGNOSIS — I5030 Unspecified diastolic (congestive) heart failure: Secondary | ICD-10-CM | POA: Diagnosis not present

## 2024-10-13 DIAGNOSIS — I480 Paroxysmal atrial fibrillation: Secondary | ICD-10-CM | POA: Diagnosis not present

## 2024-10-13 DIAGNOSIS — I2510 Atherosclerotic heart disease of native coronary artery without angina pectoris: Secondary | ICD-10-CM | POA: Diagnosis not present

## 2024-10-13 DIAGNOSIS — I10 Essential (primary) hypertension: Secondary | ICD-10-CM | POA: Diagnosis not present

## 2024-10-15 DIAGNOSIS — R269 Unspecified abnormalities of gait and mobility: Secondary | ICD-10-CM | POA: Diagnosis not present

## 2024-10-15 DIAGNOSIS — N390 Urinary tract infection, site not specified: Secondary | ICD-10-CM | POA: Diagnosis not present

## 2024-10-15 DIAGNOSIS — I480 Paroxysmal atrial fibrillation: Secondary | ICD-10-CM | POA: Diagnosis not present

## 2024-10-15 DIAGNOSIS — E7800 Pure hypercholesterolemia, unspecified: Secondary | ICD-10-CM | POA: Diagnosis not present

## 2024-10-15 DIAGNOSIS — N401 Enlarged prostate with lower urinary tract symptoms: Secondary | ICD-10-CM | POA: Diagnosis not present

## 2024-10-15 DIAGNOSIS — I5030 Unspecified diastolic (congestive) heart failure: Secondary | ICD-10-CM | POA: Diagnosis not present

## 2024-10-15 DIAGNOSIS — I10 Essential (primary) hypertension: Secondary | ICD-10-CM | POA: Diagnosis not present

## 2024-10-15 DIAGNOSIS — I700 Atherosclerosis of aorta: Secondary | ICD-10-CM | POA: Diagnosis not present

## 2024-10-15 DIAGNOSIS — Z952 Presence of prosthetic heart valve: Secondary | ICD-10-CM | POA: Diagnosis not present

## 2024-10-16 ENCOUNTER — Other Ambulatory Visit: Payer: Self-pay | Admitting: Physician Assistant

## 2024-10-17 ENCOUNTER — Telehealth: Payer: Self-pay | Admitting: Cardiovascular Disease

## 2024-10-17 NOTE — Telephone Encounter (Signed)
 I spoke with Prentice at [facility].  They received notification amiodarone  refill was rejected.  Refill in chart pending.  Refill sent to [facility].  Prentice confirms he has received refill.

## 2024-10-17 NOTE — Telephone Encounter (Signed)
 Pt c/o medication issue:  1. Name of Medication: amiodarone  (PACERONE ) 200 MG tablet   2. How are you currently taking this medication (dosage and times per day)? As directed  3. Are you having a reaction (difficulty breathing--STAT)? no  4. What is your medication issue? Prentice with [facility] called because medication was rejected when trying to refill. Prentice would just like to follow up on this. Please advise.

## 2024-10-17 NOTE — Telephone Encounter (Signed)
 See refill note.

## 2024-11-22 ENCOUNTER — Other Ambulatory Visit: Payer: Self-pay | Admitting: Physician Assistant

## 2024-11-22 DIAGNOSIS — I484 Atypical atrial flutter: Secondary | ICD-10-CM

## 2024-11-22 DIAGNOSIS — I480 Paroxysmal atrial fibrillation: Secondary | ICD-10-CM
